# Patient Record
Sex: Female | Born: 1960
Health system: Southern US, Community
[De-identification: ages and names within clinical notes are randomized; demographics above are authoritative.]

## PROBLEM LIST (undated history)

## (undated) ENCOUNTER — Emergency Department (HOSPITAL_COMMUNITY): Admission: EM | Payer: Medicaid Other

## (undated) DIAGNOSIS — I252 Old myocardial infarction: Secondary | ICD-10-CM

## (undated) DIAGNOSIS — R519 Headache, unspecified: Secondary | ICD-10-CM

## (undated) DIAGNOSIS — C801 Malignant (primary) neoplasm, unspecified: Secondary | ICD-10-CM

## (undated) DIAGNOSIS — T8859XA Other complications of anesthesia, initial encounter: Secondary | ICD-10-CM

## (undated) DIAGNOSIS — R011 Cardiac murmur, unspecified: Secondary | ICD-10-CM

## (undated) DIAGNOSIS — Z9981 Dependence on supplemental oxygen: Secondary | ICD-10-CM

## (undated) DIAGNOSIS — I739 Peripheral vascular disease, unspecified: Secondary | ICD-10-CM

## (undated) DIAGNOSIS — J189 Pneumonia, unspecified organism: Secondary | ICD-10-CM

## (undated) DIAGNOSIS — I209 Angina pectoris, unspecified: Secondary | ICD-10-CM

## (undated) DIAGNOSIS — L600 Ingrowing nail: Secondary | ICD-10-CM

## (undated) DIAGNOSIS — I4891 Unspecified atrial fibrillation: Secondary | ICD-10-CM

## (undated) DIAGNOSIS — I499 Cardiac arrhythmia, unspecified: Secondary | ICD-10-CM

## (undated) DIAGNOSIS — E119 Type 2 diabetes mellitus without complications: Secondary | ICD-10-CM

## (undated) DIAGNOSIS — R0600 Dyspnea, unspecified: Secondary | ICD-10-CM

## (undated) DIAGNOSIS — D649 Anemia, unspecified: Secondary | ICD-10-CM

## (undated) DIAGNOSIS — K5900 Constipation, unspecified: Secondary | ICD-10-CM

## (undated) DIAGNOSIS — K219 Gastro-esophageal reflux disease without esophagitis: Secondary | ICD-10-CM

## (undated) DIAGNOSIS — R5383 Other fatigue: Secondary | ICD-10-CM

## (undated) DIAGNOSIS — J449 Chronic obstructive pulmonary disease, unspecified: Secondary | ICD-10-CM

## (undated) DIAGNOSIS — I10 Essential (primary) hypertension: Secondary | ICD-10-CM

## (undated) HISTORY — PX: MULTIPLE TOOTH EXTRACTIONS: SHX2053

## (undated) HISTORY — DX: Ingrowing nail: L60.0

## (undated) HISTORY — PX: OTHER SURGICAL HISTORY: SHX169

## (undated) HISTORY — PX: TONSILLECTOMY: SUR1361

---
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
## 1980-04-06 DIAGNOSIS — I209 Angina pectoris, unspecified: Secondary | ICD-10-CM

## 1980-04-06 HISTORY — DX: Angina pectoris, unspecified: I20.9

## 1993-04-06 DIAGNOSIS — L600 Ingrowing nail: Secondary | ICD-10-CM | POA: Insufficient documentation

## 1998-04-06 HISTORY — PX: NECK SURGERY: SHX720

## 1998-04-06 HISTORY — PX: CERVICAL DISC SURGERY: SHX588

## 1998-10-30 ENCOUNTER — Other Ambulatory Visit: Admission: RE | Admit: 1998-10-30 | Discharge: 1998-10-30 | Payer: Self-pay | Admitting: Obstetrics and Gynecology

## 1998-10-30 ENCOUNTER — Encounter (INDEPENDENT_AMBULATORY_CARE_PROVIDER_SITE_OTHER): Payer: Self-pay

## 1998-11-07 ENCOUNTER — Encounter: Payer: Self-pay | Admitting: Neurosurgery

## 1998-11-08 ENCOUNTER — Encounter: Payer: Self-pay | Admitting: Neurosurgery

## 1998-11-08 ENCOUNTER — Ambulatory Visit (HOSPITAL_COMMUNITY): Admission: RE | Admit: 1998-11-08 | Discharge: 1998-11-09 | Payer: Self-pay | Admitting: Neurosurgery

## 1998-12-24 ENCOUNTER — Encounter: Payer: Self-pay | Admitting: Neurosurgery

## 1998-12-24 ENCOUNTER — Ambulatory Visit (HOSPITAL_COMMUNITY): Admission: RE | Admit: 1998-12-24 | Discharge: 1998-12-24 | Payer: Self-pay | Admitting: Neurosurgery

## 2001-02-21 ENCOUNTER — Ambulatory Visit (HOSPITAL_COMMUNITY): Admission: RE | Admit: 2001-02-21 | Discharge: 2001-02-21 | Payer: Self-pay | Admitting: Obstetrics and Gynecology

## 2001-02-21 ENCOUNTER — Encounter: Payer: Self-pay | Admitting: Obstetrics and Gynecology

## 2001-03-22 ENCOUNTER — Other Ambulatory Visit: Admission: RE | Admit: 2001-03-22 | Discharge: 2001-03-22 | Payer: Self-pay | Admitting: Obstetrics and Gynecology

## 2002-02-23 DIAGNOSIS — M654 Radial styloid tenosynovitis [de Quervain]: Secondary | ICD-10-CM | POA: Insufficient documentation

## 2007-10-31 ENCOUNTER — Ambulatory Visit: Payer: Self-pay | Admitting: Nurse Practitioner

## 2007-10-31 ENCOUNTER — Encounter: Payer: Self-pay | Admitting: Gastroenterology

## 2007-10-31 DIAGNOSIS — R143 Flatulence: Secondary | ICD-10-CM

## 2007-10-31 DIAGNOSIS — M5030 Other cervical disc degeneration, unspecified cervical region: Secondary | ICD-10-CM | POA: Insufficient documentation

## 2007-10-31 DIAGNOSIS — A6000 Herpesviral infection of urogenital system, unspecified: Secondary | ICD-10-CM | POA: Insufficient documentation

## 2007-10-31 DIAGNOSIS — R141 Gas pain: Secondary | ICD-10-CM | POA: Insufficient documentation

## 2007-10-31 DIAGNOSIS — R142 Eructation: Secondary | ICD-10-CM

## 2007-10-31 DIAGNOSIS — K5900 Constipation, unspecified: Secondary | ICD-10-CM | POA: Insufficient documentation

## 2007-10-31 LAB — CONVERTED CEMR LAB
Bilirubin Urine: NEGATIVE
Blood in Urine, dipstick: NEGATIVE
Glucose, Urine, Semiquant: NEGATIVE
Ketones, urine, test strip: NEGATIVE
Nitrite: NEGATIVE
Protein, U semiquant: NEGATIVE
Specific Gravity, Urine: 1.01
Urobilinogen, UA: 0.2
WBC Urine, dipstick: NEGATIVE
pH: 6

## 2007-11-02 ENCOUNTER — Encounter (INDEPENDENT_AMBULATORY_CARE_PROVIDER_SITE_OTHER): Payer: Self-pay | Admitting: Nurse Practitioner

## 2008-09-04 ENCOUNTER — Encounter (INDEPENDENT_AMBULATORY_CARE_PROVIDER_SITE_OTHER): Payer: Self-pay | Admitting: Nurse Practitioner

## 2010-05-06 NOTE — Letter (Signed)
Summary: Handout Printed  Printed Handout:  - Constipation

## 2010-05-06 NOTE — Letter (Signed)
Summary: PATIENT INFORMATION & HISTORY SHEET  PATIENT INFORMATION & HISTORY SHEET   Imported ByArta Bruce 10/31/2007 15:38:15  _____________________________________________________________________  External Attachment:    Type:   Image     Comment:   External Document

## 2010-05-06 NOTE — Miscellaneous (Signed)
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.04) Observations: Added new observation of OTHER X-RAY: right wrist - normal study (11/05/2001 14:04) Added new observation of CXR RESULTS: no active disease (07/27/2001 14:04) Added new observation of OTHER X-RAY: cervical - disc space narrowing C4-5 with no other significant abnormality (09/13/1998 14:05) Added new observation of TD BOOSTER: historical (10/06/1994 14:03)      X-ray  Procedure date:  11/05/2001  Findings:      right wrist - normal study  CXR  Procedure date:  07/27/2001  Findings:      no active disease  X-ray  Procedure date:  09/13/1998  Findings:      cervical - disc space narrowing C4-5 with no other significant abnormality   X-ray  Procedure date:  11/05/2001  Findings:      right wrist - normal study  CXR  Procedure date:  07/27/2001  Findings:      no active disease  X-ray  Procedure date:  09/13/1998  Findings:      cervical - disc space narrowing C4-5 with no other significant abnormality

## 2010-05-06 NOTE — Letter (Signed)
Summary: BEHAVIORAL GUIDELINE  BEHAVIORAL GUIDELINE   Imported By: Arta Bruce 10/31/2007 15:40:22  _____________________________________________________________________  External Attachment:    Type:   Image     Comment:   External Document

## 2010-05-06 NOTE — Letter (Signed)
Summary: Handout Printed  Printed Handout:  - Flatulence Landscape architect)

## 2010-05-06 NOTE — Letter (Signed)
Summary: Handout Printed  Printed Handout:  - Fiber in Your Diet 

## 2010-05-06 NOTE — Assessment & Plan Note (Signed)
Summary: NEW -Constipation   Vital Signs:  Patient Profile:   50 Years Old Female Height:     65 inches Weight:      180 pounds BMI:     30.06 BSA:     1.89 Temp:     97.8 degrees F oral Pulse rate:   80 / minute Pulse rhythm:   regular Resp:     20 per minute BP sitting:   100 / 62  (left arm) Cuff size:   regular  Pt. in pain?   no  Vitals Entered By: Levon Hedger (October 31, 2007 11:31 AM)              Is Patient Diabetic? No  Does patient need assistance? Ambulation Normal Comments pt states that she has herpes and doesnt know if this is a flare-up or is it some other type of infection.     Chief Complaint:  new establish/vaginal discharge and discomfort.Marland Kitchen  History of Present Illness:  Pt into the office to establish care. Pt was previously a pt of [facility].  Last visit back in 2007. Last CPE 2004.  no recent PAP or mammogram.  Constipation -  notes that problems started back in 2007.  She eats some fruit and veges.  She has a little BM daily. She does take a fiber cleanser.  She has never had a colonscopy.  No blood noted in stool.  Some gas.  She does take charcoal pills for gas. stomach with some bloating from time to time. pt has started to increase her water.  She is now drinking 6-8 oz per day. She has been taking fiber supplements though she does not want to take daily. She knows that she needs to eat more leafy green veges, lettuce. snack on fruit.  no apples, raisins and grapes. No family hx of colon cancer  Herpes outbreak - about every 3 months.  sometimes she just gets itching and irritation.   she does see a coordination between her outbreaks and her lack of bowel movements.        Updated Prior Medication List: No Medications Current Allergies (reviewed today): ! PENICILLIN   Family History:    Family History Hypertension - father  Social History:    G3-1-1-1P0    tobacco use - stopped in 2007    Alcohol use-no    Drug  use-no    separated   Risk Factors:  Tobacco use:  quit    Year quit:  2007 Drug use:  no Caffeine use:  1 drinks per day Alcohol use:  no Seatbelt use:  100 % Sun Exposure:  occasionally  Family History Risk Factors:    Family History of MI in females < 37 years old:  no    Family History of MI in males < 65 years old:  no   Review of Systems  CV      Denies fatigue.  Resp      Denies cough.  GI      Complains of abdominal pain and constipation.  GU      Denies dysuria.   Physical Exam  General:     alert.   Head:     normocephalic.   Eyes:     glasses Lungs:     normal breath sounds.   Heart:     normal rate and regular rhythm.   Abdomen:      soft, non-tender, normal bowel sounds, and no distention.   Msk:  up to exam table Neurologic:     alert & oriented X3.   Psych:     Oriented X3.      Impression & Recommendations:  Problem # 1:  CONSTIPATION (ICD-564.00) handout given advised to eat more fiberous foods exercise water stool cards x 3 sent home with pt Orders: UA Dipstick w/o Micro (manual) (16109)   Problem # 2:  FLATULENCE (ICD-787.3) handout given  may take beano   Patient Instructions: 1)  Sign a release to get information from [facility]. 2)  You will need a complete physical exam.  You will need complete labs so do not eat after midnght on the day prior to exam.  You will get PAP and mammogram  3)  Add more fiber to diet.   4)  -drink water 5)  -exercise (water) 6)  -Return stool cards when collected   ] Laboratory Results   Urine Tests  Date/Time Received: October 31, 2007 4:48 PM Date/Time Reported:  October 31, 2007 4:48 PM   Routine Urinalysis   Color: lt. yellow Appearance: Clear Glucose: negative   (Normal Range: Negative) Bilirubin: negative   (Normal Range: Negative) Ketone: negative   (Normal Range: Negative) Spec. Gravity: 1.010   (Normal Range: 1.003-1.035) Blood: negative   (Normal Range:  Negative) pH: 6.0   (Normal Range: 5.0-8.0) Protein: negative   (Normal Range: Negative) Urobilinogen: 0.2   (Normal Range: 0-1) Nitrite: negative   (Normal Range: Negative) Leukocyte Esterace: negative   (Normal Range: Negative)

## 2010-08-28 ENCOUNTER — Other Ambulatory Visit: Payer: Self-pay | Admitting: Obstetrics and Gynecology

## 2010-08-28 DIAGNOSIS — Z1231 Encounter for screening mammogram for malignant neoplasm of breast: Secondary | ICD-10-CM

## 2010-09-03 ENCOUNTER — Ambulatory Visit: Payer: Self-pay

## 2010-09-05 ENCOUNTER — Ambulatory Visit
Admission: RE | Admit: 2010-09-05 | Discharge: 2010-09-05 | Disposition: A | Discharge status: Home / Self Care | Payer: 59 | Source: Ambulatory Visit | Attending: Obstetrics and Gynecology | Admitting: Obstetrics and Gynecology

## 2010-09-05 DIAGNOSIS — Z1231 Encounter for screening mammogram for malignant neoplasm of breast: Secondary | ICD-10-CM

## 2011-01-13 ENCOUNTER — Inpatient Hospital Stay (INDEPENDENT_AMBULATORY_CARE_PROVIDER_SITE_OTHER)
Admission: RE | Admit: 2011-01-13 | Discharge: 2011-01-13 | Disposition: A | Discharge status: Home / Self Care | Payer: 59 | Source: Ambulatory Visit | Attending: Family Medicine | Admitting: Family Medicine

## 2011-01-13 DIAGNOSIS — J310 Chronic rhinitis: Secondary | ICD-10-CM

## 2011-01-13 DIAGNOSIS — J019 Acute sinusitis, unspecified: Secondary | ICD-10-CM

## 2011-06-09 ENCOUNTER — Encounter (INDEPENDENT_AMBULATORY_CARE_PROVIDER_SITE_OTHER): Payer: 59 | Admitting: Obstetrics and Gynecology

## 2011-06-09 DIAGNOSIS — E569 Vitamin deficiency, unspecified: Secondary | ICD-10-CM

## 2011-06-09 DIAGNOSIS — N951 Menopausal and female climacteric states: Secondary | ICD-10-CM

## 2011-06-09 DIAGNOSIS — N926 Irregular menstruation, unspecified: Secondary | ICD-10-CM

## 2011-06-15 ENCOUNTER — Encounter: Payer: 59 | Admitting: Obstetrics and Gynecology

## 2011-06-16 ENCOUNTER — Other Ambulatory Visit: Payer: 59

## 2011-06-19 ENCOUNTER — Other Ambulatory Visit: Payer: 59

## 2011-06-19 ENCOUNTER — Other Ambulatory Visit (INDEPENDENT_AMBULATORY_CARE_PROVIDER_SITE_OTHER): Payer: 59

## 2011-06-19 ENCOUNTER — Encounter: Payer: 59 | Admitting: Obstetrics and Gynecology

## 2011-06-19 ENCOUNTER — Encounter (INDEPENDENT_AMBULATORY_CARE_PROVIDER_SITE_OTHER): Payer: 59 | Admitting: Obstetrics and Gynecology

## 2011-06-19 DIAGNOSIS — N950 Postmenopausal bleeding: Secondary | ICD-10-CM

## 2011-06-19 DIAGNOSIS — N8500 Endometrial hyperplasia, unspecified: Secondary | ICD-10-CM

## 2011-07-22 ENCOUNTER — Telehealth: Payer: Self-pay | Admitting: Obstetrics and Gynecology

## 2011-08-03 ENCOUNTER — Other Ambulatory Visit: Payer: Self-pay | Admitting: Obstetrics and Gynecology

## 2011-08-03 NOTE — Progress Notes (Signed)
Addended by: Jaymes Graff on: 08/03/2011 06:58 PM   Modules accepted: Orders

## 2011-08-18 ENCOUNTER — Encounter: Payer: 59 | Admitting: Obstetrics and Gynecology

## 2011-08-24 ENCOUNTER — Telehealth: Payer: Self-pay | Admitting: Obstetrics and Gynecology

## 2011-08-24 NOTE — Telephone Encounter (Signed)
D&C HYSTEROSCOPY; POLYPECTOMY SCHEDULED FOR 09/10/11 @ 11:15 WITH ND. [employer] EFFECTIVE 09/04/08.  PLAN PAYS 80/20 AFTER A $1,000 DEDUCTIBLE. PRE-OP DUE $81.49 Adrianne Pridgen

## 2011-08-27 ENCOUNTER — Inpatient Hospital Stay (HOSPITAL_COMMUNITY): Admission: RE | Admit: 2011-08-27 | Payer: 59 | Source: Ambulatory Visit

## 2011-09-07 ENCOUNTER — Encounter: Payer: Self-pay | Admitting: Obstetrics and Gynecology

## 2011-09-07 ENCOUNTER — Ambulatory Visit (INDEPENDENT_AMBULATORY_CARE_PROVIDER_SITE_OTHER): Payer: 59 | Admitting: Obstetrics and Gynecology

## 2011-09-07 VITALS — BP 120/60 | HR 54 | Temp 98.0°F | Ht 66.5 in | Wt 176.0 lb

## 2011-09-07 DIAGNOSIS — N924 Excessive bleeding in the premenopausal period: Secondary | ICD-10-CM
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
## 2011-09-08 ENCOUNTER — Encounter (HOSPITAL_COMMUNITY): Payer: Self-pay

## 2011-09-08 ENCOUNTER — Encounter (HOSPITAL_COMMUNITY)
Admission: RE | Admit: 2011-09-08 | Discharge: 2011-09-08 | Disposition: A | Discharge status: Home / Self Care | Payer: 59 | Source: Ambulatory Visit | Attending: Obstetrics and Gynecology | Admitting: Obstetrics and Gynecology

## 2011-09-08 DIAGNOSIS — Z01818 Encounter for other preprocedural examination: Secondary | ICD-10-CM | POA: Insufficient documentation

## 2011-09-08 DIAGNOSIS — Z01812 Encounter for preprocedural laboratory examination: Secondary | ICD-10-CM | POA: Insufficient documentation

## 2011-09-08 HISTORY — DX: Angina pectoris, unspecified: I20.9

## 2011-09-08 HISTORY — DX: Old myocardial infarction: I25.2

## 2011-09-08 HISTORY — DX: Cardiac murmur, unspecified: R01.1

## 2011-09-08 LAB — CBC
HCT: 40.1 % (ref 36.0–46.0)
Hemoglobin: 13.6 g/dL (ref 12.0–15.0)
MCH: 30.6 pg (ref 26.0–34.0)
MCHC: 33.9 g/dL (ref 30.0–36.0)
MCV: 90.1 fL (ref 78.0–100.0)
Platelets: 214 10*3/uL (ref 150–400)
RBC: 4.45 MIL/uL (ref 3.87–5.11)
RDW: 12.9 % (ref 11.5–15.5)
WBC: 6.9 10*3/uL (ref 4.0–10.5)

## 2011-09-08 NOTE — Patient Instructions (Addendum)
YOUR PROCEDURE IS SCHEDULED ON:09/10/11  ENTER THROUGH THE MAIN ENTRANCE OF [facility] [phone] am Thursday  USE DESK PHONE AND DIAL [phone] TO INFORM us OF YOUR ARRIVAL  CALL [phone] IF YOU HAVE ANY QUESTIONS OR PROBLEMS PRIOR TO YOUR ARRIVAL.  REMEMBER: DO NOT EAT OR DRINK AFTER MIDNIGHT : Wed    YOU MAY BRUSH YOUR TEETH THE MORNING OF SURGERY   TAKE THESE MEDICINES THE DAY OF SURGERY WITH SIP OF WATER:   DO NOT WEAR JEWELRY, EYE MAKEUP, LIPSTICK OR DARK FINGERNAIL POLISH DO NOT WEAR LOTIONS DO NOT SHAVE FOR 48 HOURS PRIOR TO SURGERY  YOU WILL NOT BE ALLOWED TO DRIVE YOURSELF HOME.  NAME OF DRIVER:Jerry Shoffner

## 2011-09-08 NOTE — Pre-Procedure Instructions (Signed)
Patient seemed unsure of details re "heart attack" in 1980s. She was seen at [facility], which is no longer in existence. I ran an EKG and gave results ( sinus bradycardia) to Dr. Jean Rosenthal, which he is okay with. Pt states she has always had a slow pulse.

## 2011-09-09 ENCOUNTER — Encounter: Payer: 59 | Admitting: Obstetrics and Gynecology

## 2011-09-30 ENCOUNTER — Other Ambulatory Visit: Payer: Self-pay | Admitting: Obstetrics and Gynecology

## 2011-10-07 ENCOUNTER — Encounter: Payer: 59 | Admitting: Obstetrics and Gynecology

## 2011-11-26 ENCOUNTER — Encounter (HOSPITAL_COMMUNITY): Admission: RE | Payer: Self-pay | Source: Ambulatory Visit

## 2011-11-26 ENCOUNTER — Ambulatory Visit (HOSPITAL_COMMUNITY): Admission: RE | Admit: 2011-11-26 | Payer: 59 | Source: Ambulatory Visit | Admitting: Obstetrics and Gynecology

## 2011-11-26 SURGERY — DILATATION AND CURETTAGE /HYSTEROSCOPY
Anesthesia: Choice

## 2011-12-07 ENCOUNTER — Encounter: Payer: 59 | Admitting: Obstetrics and Gynecology

## 2011-12-09 ENCOUNTER — Encounter: Payer: 59 | Admitting: Obstetrics and Gynecology

## 2011-12-24 ENCOUNTER — Telehealth: Payer: Self-pay | Admitting: Obstetrics and Gynecology

## 2011-12-24 NOTE — Telephone Encounter (Signed)
Call to pharmacy to advised that patient is to have 2 bottles of her Elestrin 6% gel as she is to use 2 pumps daily.  Leba Tibbitts, PA-C

## 2011-12-24 NOTE — Telephone Encounter (Signed)
VM from [facility]. Pt has RX for Elestrin gel. Directions state 1 pump daily but pt states is to use 2 pumps/day. If so, needs 2 bottles . Pharmacy # [phone].

## 2012-08-02 ENCOUNTER — Other Ambulatory Visit: Payer: Self-pay

## 2012-08-02 DIAGNOSIS — Z1231 Encounter for screening mammogram for malignant neoplasm of breast: Secondary | ICD-10-CM

## 2012-08-03 ENCOUNTER — Ambulatory Visit
Admission: RE | Admit: 2012-08-03 | Discharge: 2012-08-03 | Disposition: A | Discharge status: Home / Self Care | Payer: 59 | Source: Ambulatory Visit

## 2012-08-03 DIAGNOSIS — Z1231 Encounter for screening mammogram for malignant neoplasm of breast: Secondary | ICD-10-CM

## 2017-10-06 ENCOUNTER — Encounter (HOSPITAL_COMMUNITY): Payer: Self-pay

## 2017-10-06 ENCOUNTER — Emergency Department (HOSPITAL_COMMUNITY)
Admission: EM | Admit: 2017-10-06 | Discharge: 2017-10-06 | Disposition: A | Discharge status: Home / Self Care | Payer: No Typology Code available for payment source | Attending: Emergency Medicine | Admitting: Emergency Medicine

## 2017-10-06 ENCOUNTER — Other Ambulatory Visit: Payer: Self-pay

## 2017-10-06 ENCOUNTER — Emergency Department (HOSPITAL_COMMUNITY): Payer: No Typology Code available for payment source

## 2017-10-06 DIAGNOSIS — S0081XA Abrasion of other part of head, initial encounter: Secondary | ICD-10-CM | POA: Insufficient documentation

## 2017-10-06 DIAGNOSIS — S20212A Contusion of left front wall of thorax, initial encounter: Secondary | ICD-10-CM

## 2017-10-06 DIAGNOSIS — S40021A Contusion of right upper arm, initial encounter: Secondary | ICD-10-CM | POA: Insufficient documentation

## 2017-10-06 DIAGNOSIS — S161XXA Strain of muscle, fascia and tendon at neck level, initial encounter: Secondary | ICD-10-CM | POA: Insufficient documentation

## 2017-10-06 DIAGNOSIS — Y9389 Activity, other specified: Secondary | ICD-10-CM | POA: Insufficient documentation

## 2017-10-06 DIAGNOSIS — R519 Headache, unspecified: Secondary | ICD-10-CM

## 2017-10-06 DIAGNOSIS — Y999 Unspecified external cause status: Secondary | ICD-10-CM | POA: Insufficient documentation

## 2017-10-06 DIAGNOSIS — Z79899 Other long term (current) drug therapy: Secondary | ICD-10-CM | POA: Diagnosis not present

## 2017-10-06 DIAGNOSIS — S199XXA Unspecified injury of neck, initial encounter: Secondary | ICD-10-CM | POA: Diagnosis present

## 2017-10-06 DIAGNOSIS — R51 Headache: Secondary | ICD-10-CM | POA: Insufficient documentation

## 2017-10-06 DIAGNOSIS — Y92410 Unspecified street and highway as the place of occurrence of the external cause: Secondary | ICD-10-CM | POA: Insufficient documentation

## 2017-10-06 DIAGNOSIS — S301XXA Contusion of abdominal wall, initial encounter: Secondary | ICD-10-CM | POA: Diagnosis not present

## 2017-10-06 LAB — CBC WITH DIFFERENTIAL/PLATELET
Basophils Absolute: 0 10*3/uL (ref 0.0–0.1)
Basophils Relative: 1 %
Eosinophils Absolute: 0.3 10*3/uL (ref 0.0–0.7)
Eosinophils Relative: 5 %
HCT: 39.9 % (ref 36.0–46.0)
Hemoglobin: 13.1 g/dL (ref 12.0–15.0)
Lymphocytes Relative: 39 %
Lymphs Abs: 2.4 10*3/uL (ref 0.7–4.0)
MCH: 28.9 pg (ref 26.0–34.0)
MCHC: 32.8 g/dL (ref 30.0–36.0)
MCV: 88.1 fL (ref 78.0–100.0)
Monocytes Absolute: 0.3 10*3/uL (ref 0.1–1.0)
Monocytes Relative: 5 %
Neutro Abs: 3.1 10*3/uL (ref 1.7–7.7)
Neutrophils Relative %: 50 %
Platelets: 233 10*3/uL (ref 150–400)
RBC: 4.53 MIL/uL (ref 3.87–5.11)
RDW: 13.6 % (ref 11.5–15.5)
WBC: 6.1 10*3/uL (ref 4.0–10.5)

## 2017-10-06 LAB — BASIC METABOLIC PANEL
Anion gap: 11 (ref 5–15)
BUN: 8 mg/dL (ref 6–20)
CO2: 23 mmol/L (ref 22–32)
Calcium: 9 mg/dL (ref 8.9–10.3)
Chloride: 109 mmol/L (ref 98–111)
Creatinine, Ser: 0.78 mg/dL (ref 0.44–1.00)
GFR calc Af Amer: >60 mL/min (ref >60)
GFR calc non Af Amer: >60 mL/min (ref >60)
Glucose, Bld: 164 mg/dL — ABNORMAL HIGH (ref 70–99)
Potassium: 3.7 mmol/L (ref 3.5–5.1)
Sodium: 143 mmol/L (ref 135–145)

## 2017-10-06 MED ORDER — IOPAMIDOL (ISOVUE-300) INJECTION 61%
100.0000 mL | Freq: Once | INTRAVENOUS | Status: AC | PRN
  Administered 2017-10-06: 100 mL via INTRAVENOUS

## 2017-10-06 MED ORDER — IOPAMIDOL (ISOVUE-300) INJECTION 61%
INTRAVENOUS | Status: AC
  Filled 2017-10-06: qty 100

## 2017-10-06 MED ORDER — ORPHENADRINE CITRATE ER 100 MG PO TB12: 100.0000 mg | ORAL_TABLET | Freq: Two times a day (BID) | ORAL | 0 refills | Status: AC

## 2017-10-06 MED ORDER — MELOXICAM 7.5 MG PO TABS: 7.5000 mg | ORAL_TABLET | Freq: Every day | ORAL | 0 refills | Status: DC

## 2017-10-06 MED ORDER — KETOROLAC TROMETHAMINE 15 MG/ML IJ SOLN
15.0000 mg | Freq: Once | INTRAMUSCULAR | Status: AC
  Administered 2017-10-06: 15 mg via INTRAVENOUS
  Filled 2017-10-06: qty 1

## 2017-10-06 NOTE — ED Notes (Signed)
Patient verbalized understanding of discharge instructions, no questions. Patient ambulated out of ED with steady gait in no distress.  
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
## 2017-10-06 NOTE — Discharge Instructions (Addendum)
Follow up with your PCP for recheck in 2 days. Return to the ER for worsening or concerning symptoms.  Warm compresses to sore muscles.  Meloxicam and Norflex as needed as prescribed for pain, headaches, spasms. Do not drive or operate machinery while taking Norflex.
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
## 2018-04-11 ENCOUNTER — Telehealth: Payer: Self-pay
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
## 2018-04-15 ENCOUNTER — Encounter: Payer: Self-pay | Admitting: Nurse Practitioner

## 2018-04-15 ENCOUNTER — Ambulatory Visit: Payer: Self-pay | Admitting: Nurse Practitioner

## 2018-04-15 VITALS — BP 110/70 | HR 65 | Temp 97.7°F | Ht 65.0 in | Wt 198.0 lb

## 2018-04-15 DIAGNOSIS — M5410 Radiculopathy, site unspecified: Secondary | ICD-10-CM | POA: Insufficient documentation

## 2018-04-15 DIAGNOSIS — M542 Cervicalgia: Secondary | ICD-10-CM | POA: Insufficient documentation

## 2018-04-15 MED ORDER — ORPHENADRINE CITRATE ER 100 MG PO TB12: 100.0000 mg | ORAL_TABLET | Freq: Two times a day (BID) | ORAL | 1 refills | Status: DC

## 2018-04-15 MED ORDER — NOREL AD 4-10-325 MG PO TABS: 1.0000 | ORAL_TABLET | Freq: Every day | ORAL | 1 refills | Status: DC

## 2018-04-15 MED ORDER — MELOXICAM 7.5 MG PO TABS: 7.5000 mg | ORAL_TABLET | ORAL | 1 refills | Status: DC | PRN
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
## 2018-04-24 ENCOUNTER — Ambulatory Visit
Admission: RE | Admit: 2018-04-24 | Discharge: 2018-04-24 | Disposition: A | Discharge status: Home / Self Care | Payer: Self-pay | Source: Ambulatory Visit | Attending: Nurse Practitioner | Admitting: Nurse Practitioner

## 2018-04-24 DIAGNOSIS — M5410 Radiculopathy, site unspecified: Secondary | ICD-10-CM

## 2018-04-24 DIAGNOSIS — M542 Cervicalgia: Secondary | ICD-10-CM

## 2018-04-25 ENCOUNTER — Other Ambulatory Visit: Payer: Self-pay | Admitting: Nurse Practitioner

## 2018-04-25 ENCOUNTER — Telehealth: Payer: Self-pay

## 2018-04-25 DIAGNOSIS — F418 Other specified anxiety disorders: Secondary | ICD-10-CM

## 2018-04-25 MED ORDER — DIAZEPAM 2 MG PO TABS: ORAL_TABLET | ORAL | 0 refills | Status: DC

## 2018-04-25 NOTE — Telephone Encounter (Signed)
Sent in 4 tabs 1 tab 30 mins prior to MRI may repeat x 1. Recommended to have someone to drive her home due to risk of causing sleepiness.

## 2018-04-25 NOTE — Telephone Encounter (Signed)
Pt stated that she has gotten her MRI scheduled, however she is going to need some Valium medication for that session since she is claustrophobic.   Please advise on med request.  Thanks, Guadelupe Sabin

## 2018-04-28 NOTE — Telephone Encounter (Signed)
I have called the pt and informed her of the message below. She stated understanding and will have a driver.  Thanks, Zettie Pho

## 2018-05-01 ENCOUNTER — Encounter: Payer: Self-pay | Admitting: Nurse Practitioner

## 2018-05-08 ENCOUNTER — Ambulatory Visit
Admission: RE | Admit: 2018-05-08 | Discharge: 2018-05-08 | Disposition: A | Discharge status: Home / Self Care | Payer: Self-pay | Source: Ambulatory Visit | Attending: Nurse Practitioner | Admitting: Nurse Practitioner

## 2018-05-08 MED ORDER — GADOBENATE DIMEGLUMINE 529 MG/ML IV SOLN
17.0000 mL | Freq: Once | INTRAVENOUS | Status: AC | PRN
  Administered 2018-05-08: 17 mL via INTRAVENOUS

## 2018-05-09 ENCOUNTER — Other Ambulatory Visit: Payer: Self-pay | Admitting: Nurse Practitioner

## 2018-05-09 ENCOUNTER — Telehealth: Payer: Self-pay | Admitting: Nurse Practitioner

## 2018-05-09 DIAGNOSIS — M5410 Radiculopathy, site unspecified: Secondary | ICD-10-CM

## 2018-05-09 DIAGNOSIS — M4802 Spinal stenosis, cervical region: Secondary | ICD-10-CM

## 2018-05-09 NOTE — Telephone Encounter (Signed)
Patient returned call and was given results of her MRI, she would like to see Dr. Deri Fuelling since she has seen him previously.

## 2018-05-09 NOTE — Telephone Encounter (Signed)
Called patient to discuss results of MRI of neck, has moderate to severe foraminal stenosis (narrowing) of the spinal column.  I would like to refer her to a neurosurgeon for further evaluation would like to confirm she does not have one already.

## 2018-12-20 ENCOUNTER — Encounter: Payer: Self-pay | Admitting: Nurse Practitioner

## 2018-12-20 ENCOUNTER — Other Ambulatory Visit: Payer: Self-pay

## 2018-12-20 ENCOUNTER — Telehealth (INDEPENDENT_AMBULATORY_CARE_PROVIDER_SITE_OTHER): Payer: Self-pay | Admitting: Nurse Practitioner

## 2018-12-20 VITALS — Temp 99.5°F | Wt 198.0 lb

## 2018-12-20 DIAGNOSIS — R509 Fever, unspecified: Secondary | ICD-10-CM | POA: Insufficient documentation

## 2018-12-20 DIAGNOSIS — Z1159 Encounter for screening for other viral diseases: Secondary | ICD-10-CM

## 2018-12-20 DIAGNOSIS — R6883 Chills (without fever): Secondary | ICD-10-CM | POA: Insufficient documentation

## 2018-12-20 MED ORDER — AZITHROMYCIN 250 MG PO TABS: ORAL_TABLET | ORAL | 0 refills | Status: AC

## 2018-12-21 ENCOUNTER — Other Ambulatory Visit: Payer: Self-pay

## 2018-12-21 DIAGNOSIS — Z20822 Contact with and (suspected) exposure to covid-19: Secondary | ICD-10-CM

## 2018-12-22 LAB — NOVEL CORONAVIRUS, NAA: SARS-CoV-2, NAA: NOT DETECTED

## 2018-12-26 ENCOUNTER — Encounter: Payer: Self-pay | Admitting: Nurse Practitioner

## 2018-12-28 ENCOUNTER — Encounter: Payer: Self-pay | Admitting: Nurse Practitioner

## 2018-12-29 ENCOUNTER — Telehealth: Payer: Self-pay

## 2018-12-29 ENCOUNTER — Other Ambulatory Visit: Payer: Self-pay

## 2018-12-29 ENCOUNTER — Encounter: Payer: Self-pay | Admitting: Nurse Practitioner

## 2018-12-29 MED ORDER — PREDNISONE 10 MG (21) PO TBPK: ORAL_TABLET | ORAL | 0 refills | Status: DC

## 2018-12-29 NOTE — Telephone Encounter (Signed)
I called patient concerning her mychart message and notified her that we have sent her in a medication to the pharmacy and I have resent her work note, Lonia Mad

## 2019-01-01 NOTE — Progress Notes (Signed)
Virtual Visit via Telephone   This visit type was conducted due to national recommendations for restrictions regarding the COVID-19 Pandemic (e.g. social distancing) in an effort to limit this patient's exposure and mitigate transmission in our community.  Due to her co-morbid illnesses, this patient is at least at moderate risk for complications without adequate follow up.  This format is felt to be most appropriate for this patient at this time.  All issues noted in this document were discussed and addressed.  A limited physical exam was performed with this format.    This visit type was conducted due to national recommendations for restrictions regarding the COVID-19 Pandemic (e.g. social distancing) in an effort to limit this patient's exposure and mitigate transmission in our community.  Patients identity confirmed using two different identifiers.  This format is felt to be most appropriate for this patient at this time.  All issues noted in this document were discussed and addressed.  No physical exam was performed (except for noted visual exam findings with Video Visits).    Date:  01/01/2019   ID:  Elizabeth Mayer, DOB February 18, 1961, MRN [id number]  Patient Location:  Home - spoke with Elizabeth Mayer  Provider location:   Office    Chief Complaint:  Low grade fever and chills  History of Present Illness:    Elizabeth Mayer is a 58 y.o. female who presents via video conferencing for a telehealth visit today.    The patient does have symptoms concerning for COVID-19 infection (fever, chills, cough, or new shortness of breath).   Fever  This is a new problem. The current episode started in the past 7 days. The problem occurs constantly. The problem has been unchanged. The temperature was taken using an oral thermometer. Pertinent negatives include no abdominal pain, congestion, coughing, headaches or muscle aches. She has tried nothing for the symptoms.  Risk factors: no contaminated  food, no recent travel and no sick contacts      Past Medical History:  Diagnosis Date  . Angina 1982   related to stress  . Asthma    in the past, not now- never inhlaer use  . Heart murmur 1970s  . Ingrown toenail   . Past heart attack 1980-1981   pt states she passed out and woke up in hospital- told she had heart attack, but then dr said he couldn't find anything wrong.   Past Surgical History:  Procedure Laterality Date  . NECK SURGERY  2000   Disc removed from neck      Current Meds  Medication Sig  . Ascorbic Acid (VITAMIN C ADULT GUMMIES PO) Take 1 capsule by mouth daily.  Marland Kitchen BIOTIN PO Take 1 tablet by mouth daily.   . meloxicam (MOBIC) 7.5 MG tablet Take 1 tablet (7.5 mg total) by mouth as needed for pain.  Renda Rolls AD 4-10-325 MG TABS Take 1 tablet by mouth daily.  . orphenadrine (NORFLEX) 100 MG tablet Take 1 tablet (100 mg total) by mouth 2 (two) times daily.     Allergies:   Pseudoephedrine, Gabapentin, and Penicillins   Social History   Tobacco Use  . Smoking status: Never Smoker  . Smokeless tobacco: Never Used  Substance Use Topics  . Alcohol use: No  . Drug use: No     Family Hx: The patient's family history includes Heart failure in her father.  ROS:   Please see the history of present illness.    Review of Systems  Constitutional: Positive for fever.  HENT: Negative for congestion.   Eyes: Negative.   Respiratory: Negative for cough.   Cardiovascular: Negative.   Gastrointestinal: Negative for abdominal pain.  Neurological: Negative for dizziness and headaches.    All other systems reviewed and are negative.   Labs/Other Tests and Data Reviewed:    Recent Labs: No results found for requested labs within last 8760 hours.   Recent Lipid Panel No results found for: CHOL, TRIG, HDL, CHOLHDL, LDLCALC, LDLDIRECT  Wt Readings from Last 3 Encounters:  12/20/18 198 lb (89.8 kg)  04/15/18 198 lb (89.8 kg)  10/06/17 176 lb (79.8 kg)      Exam:    Vital Signs:  Temp 99.5 F (37.5 C) (Oral)   Wt 198 lb (89.8 kg)   BMI 32.95 kg/m     Physical Exam  Constitutional: She is well-developed, well-nourished, and in no distress.  Psychiatric: Mood, memory, affect and judgment normal.    ASSESSMENT & PLAN:     1. Fever, unspecified fever cause  I will have her tested for coronavirus due to the symptoms and the current pandemic  She is working outside of the house - azithromycin (ZITHROMAX) 250 MG tablet; Take 2 tablets (500 mg) on  Day 1,  followed by 1 tablet (250 mg) once daily on Days 2 through 5.  Dispense: 6 each; Refill: 0  2. Chills  I will also treat empirically pending lab results.  She is advised to remain in self-isolation until her labs returns - azithromycin (ZITHROMAX) 250 MG tablet; Take 2 tablets (500 mg) on  Day 1,  followed by 1 tablet (250 mg) once daily on Days 2 through 5.  Dispense: 6 each; Refill: 0    COVID-19 Education: The signs and symptoms of COVID-19 were discussed with the patient and how to seek care for testing (follow up with PCP or arrange E-visit).  The importance of social distancing was discussed today.  Patient Risk:   After full review of this patients clinical status, I feel that they are at least moderate risk at this time.  Time:   Today, I have spent 12 minutes/ seconds with the patient with telehealth technology discussing above diagnoses.     Medication Adjustments/Labs and Tests Ordered: Current medicines are reviewed at length with the patient today.  Concerns regarding medicines are outlined above.   Tests Ordered: No orders of the defined types were placed in this encounter.   Medication Changes: Meds ordered this encounter  Medications  . azithromycin (ZITHROMAX) 250 MG tablet    Sig: Take 2 tablets (500 mg) on  Day 1,  followed by 1 tablet (250 mg) once daily on Days 2 through 5.    Dispense:  6 each    Refill:  0    Disposition:  Follow up prn   Signed, Minette Brine, FNP

## 2019-01-04 ENCOUNTER — Encounter: Payer: Self-pay | Admitting: Nurse Practitioner

## 2019-01-05 ENCOUNTER — Other Ambulatory Visit: Payer: Self-pay | Admitting: Nurse Practitioner

## 2019-01-12 ENCOUNTER — Encounter: Payer: Self-pay | Admitting: Nurse Practitioner

## 2019-01-19 ENCOUNTER — Encounter: Payer: Self-pay | Admitting: Nurse Practitioner

## 2019-01-19 ENCOUNTER — Ambulatory Visit (INDEPENDENT_AMBULATORY_CARE_PROVIDER_SITE_OTHER): Payer: Managed Care, Other (non HMO) | Admitting: Nurse Practitioner

## 2019-01-19 ENCOUNTER — Other Ambulatory Visit: Payer: Self-pay

## 2019-01-19 VITALS — BP 110/60 | HR 72 | Temp 98.6°F | Ht 65.0 in | Wt 192.4 lb

## 2019-01-19 DIAGNOSIS — R509 Fever, unspecified: Secondary | ICD-10-CM

## 2019-01-19 DIAGNOSIS — N390 Urinary tract infection, site not specified: Secondary | ICD-10-CM

## 2019-01-19 DIAGNOSIS — R630 Anorexia: Secondary | ICD-10-CM

## 2019-01-19 DIAGNOSIS — R002 Palpitations: Secondary | ICD-10-CM | POA: Diagnosis not present

## 2019-01-19 DIAGNOSIS — R6883 Chills (without fever): Secondary | ICD-10-CM

## 2019-01-19 LAB — POCT URINALYSIS DIPSTICK
Bilirubin, UA: NEGATIVE
Blood, UA: NEGATIVE
Glucose, UA: NEGATIVE
Ketones, UA: NEGATIVE
Leukocytes, UA: NEGATIVE
Nitrite, UA: POSITIVE
Protein, UA: NEGATIVE
Spec Grav, UA: 1.015 (ref 1.010–1.025)
Urobilinogen, UA: 0.2 E.U./dL
pH, UA: 7 (ref 5.0–8.0)

## 2019-01-19 MED ORDER — NITROFURANTOIN MONOHYD MACRO 100 MG PO CAPS: 100.0000 mg | ORAL_CAPSULE | Freq: Two times a day (BID) | ORAL | 0 refills | Status: AC

## 2019-01-19 MED ORDER — FLUCONAZOLE 150 MG PO TABS: ORAL_TABLET | ORAL | 0 refills | Status: DC
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
## 2019-01-20 LAB — CMP14+EGFR
ALT: 6 IU/L (ref 0–32)
AST: 13 IU/L (ref 0–40)
Albumin/Globulin Ratio: 1.7 (ref 1.2–2.2)
Albumin: 4 g/dL (ref 3.8–4.9)
Alkaline Phosphatase: 39 IU/L (ref 39–117)
BUN/Creatinine Ratio: 6 — ABNORMAL LOW (ref 12–28)
BUN: 5 mg/dL — ABNORMAL LOW (ref 8–27)
Bilirubin Total: 0.3 mg/dL (ref 0.0–1.2)
CO2: 25 mmol/L (ref 20–29)
Calcium: 8.9 mg/dL (ref 8.7–10.3)
Chloride: 104 mmol/L (ref 96–106)
Creatinine, Ser: 0.83 mg/dL (ref 0.57–1.00)
GFR calc Af Amer: 89 mL/min/{1.73_m2} (ref >59)
GFR calc non Af Amer: 77 mL/min/{1.73_m2} (ref >59)
Globulin, Total: 2.4 g/dL (ref 1.5–4.5)
Glucose: 82 mg/dL (ref 65–99)
Potassium: 3.8 mmol/L (ref 3.5–5.2)
Sodium: 141 mmol/L (ref 134–144)
Total Protein: 6.4 g/dL (ref 6.0–8.5)

## 2019-01-20 LAB — LYME AB/WESTERN BLOT REFLEX
LYME DISEASE AB, QUANT, IGM: <0.8 index (ref 0.00–0.79)
Lyme IgG/IgM Ab: <0.91 {ISR} (ref 0.00–0.90)

## 2019-01-20 LAB — CBC
Hematocrit: 38.4 % (ref 34.0–46.6)
Hemoglobin: 12.7 g/dL (ref 11.1–15.9)
MCH: 28.2 pg (ref 26.6–33.0)
MCHC: 33.1 g/dL (ref 31.5–35.7)
MCV: 85 fL (ref 79–97)
Platelets: 180 10*3/uL (ref 150–450)
RBC: 4.5 x10E6/uL (ref 3.77–5.28)
RDW: 13.2 % (ref 11.7–15.4)
WBC: 5.3 10*3/uL (ref 3.4–10.8)

## 2019-01-20 LAB — HEMOGLOBIN A1C
Est. average glucose Bld gHb Est-mCnc: 128 mg/dL
Hgb A1c MFr Bld: 6.1 % — ABNORMAL HIGH (ref 4.8–5.6)

## 2019-01-20 LAB — CMP14 + ANION GAP: Anion Gap: 12 mmol/L (ref 10.0–18.0)

## 2019-01-20 LAB — MONO QUAL W/RFLX QN: Mono Qual W/Rflx Qn: NEGATIVE

## 2019-01-21 LAB — NOVEL CORONAVIRUS, NAA: SARS-CoV-2, NAA: NOT DETECTED

## 2019-01-21 LAB — URINE CULTURE

## 2019-03-20 ENCOUNTER — Telehealth: Payer: Self-pay

## 2019-03-20 NOTE — Telephone Encounter (Signed)
PT LVM TO SCHEDULE APPT.ATT TO CONTACT PT NO ANS LVM

## 2019-04-28 ENCOUNTER — Other Ambulatory Visit: Payer: Self-pay | Admitting: Nurse Practitioner

## 2019-04-28 DIAGNOSIS — M542 Cervicalgia: Secondary | ICD-10-CM

## 2019-05-08 ENCOUNTER — Other Ambulatory Visit: Payer: Self-pay

## 2019-05-08 DIAGNOSIS — M542 Cervicalgia: Secondary | ICD-10-CM

## 2019-05-08 MED ORDER — MELOXICAM 7.5 MG PO TABS: ORAL_TABLET | ORAL | 0 refills | Status: DC

## 2019-05-18 ENCOUNTER — Ambulatory Visit: Payer: Managed Care, Other (non HMO) | Attending: Internal Medicine

## 2019-05-18 DIAGNOSIS — Z20822 Contact with and (suspected) exposure to covid-19: Secondary | ICD-10-CM

## 2019-05-19 LAB — NOVEL CORONAVIRUS, NAA: SARS-CoV-2, NAA: NOT DETECTED

## 2019-06-02 ENCOUNTER — Ambulatory Visit: Payer: Managed Care, Other (non HMO) | Attending: Internal Medicine

## 2019-06-02 DIAGNOSIS — Z20822 Contact with and (suspected) exposure to covid-19: Secondary | ICD-10-CM

## 2019-06-03 LAB — NOVEL CORONAVIRUS, NAA: SARS-CoV-2, NAA: NOT DETECTED

## 2019-06-16 ENCOUNTER — Ambulatory Visit: Payer: Managed Care, Other (non HMO) | Attending: Internal Medicine

## 2019-06-16 DIAGNOSIS — Z20822 Contact with and (suspected) exposure to covid-19: Secondary | ICD-10-CM

## 2019-06-17 LAB — NOVEL CORONAVIRUS, NAA: SARS-CoV-2, NAA: NOT DETECTED

## 2019-07-31 ENCOUNTER — Encounter: Payer: Self-pay | Admitting: Nurse Practitioner

## 2019-07-31 ENCOUNTER — Ambulatory Visit: Payer: Managed Care, Other (non HMO) | Admitting: Nurse Practitioner

## 2019-07-31 ENCOUNTER — Other Ambulatory Visit: Payer: Self-pay

## 2019-07-31 VITALS — BP 124/80 | HR 80 | Temp 97.8°F | Ht 65.4 in | Wt 157.4 lb

## 2019-07-31 DIAGNOSIS — M542 Cervicalgia: Secondary | ICD-10-CM

## 2019-07-31 DIAGNOSIS — R591 Generalized enlarged lymph nodes: Secondary | ICD-10-CM

## 2019-07-31 DIAGNOSIS — Z1159 Encounter for screening for other viral diseases: Secondary | ICD-10-CM | POA: Diagnosis not present

## 2019-07-31 DIAGNOSIS — M5410 Radiculopathy, site unspecified: Secondary | ICD-10-CM

## 2019-07-31 MED ORDER — MELOXICAM 15 MG PO TABS: 15.0000 mg | ORAL_TABLET | Freq: Every day | ORAL | 2 refills | Status: DC

## 2019-07-31 MED ORDER — MELOXICAM 15 MG PO TABS: 15.0000 mg | ORAL_TABLET | Freq: Every day | ORAL | 2 refills | Status: AC

## 2019-07-31 MED ORDER — ORPHENADRINE CITRATE ER 100 MG PO TB12: 100.0000 mg | ORAL_TABLET | Freq: Two times a day (BID) | ORAL | 1 refills | Status: DC | PRN

## 2019-07-31 NOTE — Addendum Note (Signed)
Addended by: Minette Brine F on: 07/31/2019 05:33 PM   Modules accepted: Orders
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
## 2019-08-01 LAB — CBC WITH DIFFERENTIAL/PLATELET
Basophils Absolute: 0.1 10*3/uL (ref 0.0–0.2)
Basos: 1 %
EOS (ABSOLUTE): 0.4 10*3/uL (ref 0.0–0.4)
Eos: 5 %
Hematocrit: 41.4 % (ref 34.0–46.6)
Hemoglobin: 13.7 g/dL (ref 11.1–15.9)
Immature Grans (Abs): 0 10*3/uL (ref 0.0–0.1)
Immature Granulocytes: 0 %
Lymphocytes Absolute: 2.5 10*3/uL (ref 0.7–3.1)
Lymphs: 35 %
MCH: 28.4 pg (ref 26.6–33.0)
MCHC: 33.1 g/dL (ref 31.5–35.7)
MCV: 86 fL (ref 79–97)
Monocytes Absolute: 0.5 10*3/uL (ref 0.1–0.9)
Monocytes: 7 %
Neutrophils Absolute: 3.8 10*3/uL (ref 1.4–7.0)
Neutrophils: 52 %
Platelets: 238 10*3/uL (ref 150–450)
RBC: 4.83 x10E6/uL (ref 3.77–5.28)
RDW: 13.6 % (ref 11.7–15.4)
WBC: 7.2 10*3/uL (ref 3.4–10.8)

## 2019-08-01 LAB — BMP8+EGFR
BUN/Creatinine Ratio: 11 — ABNORMAL LOW (ref 12–28)
BUN: 8 mg/dL (ref 8–27)
CO2: 24 mmol/L (ref 20–29)
Calcium: 9.7 mg/dL (ref 8.7–10.3)
Chloride: 100 mmol/L (ref 96–106)
Creatinine, Ser: 0.72 mg/dL (ref 0.57–1.00)
GFR calc Af Amer: 105 mL/min/{1.73_m2} (ref >59)
GFR calc non Af Amer: 91 mL/min/{1.73_m2} (ref >59)
Glucose: 76 mg/dL (ref 65–99)
Potassium: 3.9 mmol/L (ref 3.5–5.2)
Sodium: 139 mmol/L (ref 134–144)

## 2019-08-01 LAB — TSH: TSH: 1.05 u[IU]/mL (ref 0.450–4.500)

## 2019-08-01 LAB — HEPATITIS C ANTIBODY: Hep C Virus Ab: <0.1 s/co ratio (ref 0.0–0.9)

## 2019-08-09 ENCOUNTER — Ambulatory Visit
Admission: RE | Admit: 2019-08-09 | Discharge: 2019-08-09 | Disposition: A | Discharge status: Home / Self Care | Payer: Managed Care, Other (non HMO) | Source: Ambulatory Visit | Attending: Nurse Practitioner | Admitting: Nurse Practitioner

## 2019-08-09 DIAGNOSIS — M542 Cervicalgia: Secondary | ICD-10-CM

## 2019-08-09 DIAGNOSIS — R591 Generalized enlarged lymph nodes: Secondary | ICD-10-CM

## 2019-08-16 ENCOUNTER — Other Ambulatory Visit: Payer: Self-pay | Admitting: Nurse Practitioner

## 2019-08-16 DIAGNOSIS — R591 Generalized enlarged lymph nodes: Secondary | ICD-10-CM

## 2019-08-29 ENCOUNTER — Other Ambulatory Visit: Payer: Self-pay | Admitting: Nurse Practitioner

## 2019-08-29 DIAGNOSIS — R591 Generalized enlarged lymph nodes: Secondary | ICD-10-CM

## 2019-08-29 DIAGNOSIS — M542 Cervicalgia: Secondary | ICD-10-CM

## 2019-09-01 ENCOUNTER — Other Ambulatory Visit: Payer: Managed Care, Other (non HMO)

## 2019-09-14 ENCOUNTER — Other Ambulatory Visit: Payer: Self-pay | Admitting: Otolaryngology

## 2019-09-14 DIAGNOSIS — R221 Localized swelling, mass and lump, neck: Secondary | ICD-10-CM

## 2019-09-29 ENCOUNTER — Other Ambulatory Visit: Payer: Self-pay

## 2019-09-29 ENCOUNTER — Ambulatory Visit
Admission: RE | Admit: 2019-09-29 | Discharge: 2019-09-29 | Disposition: A | Discharge status: Home / Self Care | Payer: Managed Care, Other (non HMO) | Source: Ambulatory Visit | Attending: Otolaryngology | Admitting: Otolaryngology

## 2019-09-29 DIAGNOSIS — R221 Localized swelling, mass and lump, neck: Secondary | ICD-10-CM

## 2019-09-29 MED ORDER — IOPAMIDOL (ISOVUE-300) INJECTION 61%
75.0000 mL | Freq: Once | INTRAVENOUS | Status: AC | PRN
  Administered 2019-09-29: 75 mL via INTRAVENOUS

## 2019-10-05 DIAGNOSIS — C3490 Malignant neoplasm of unspecified part of unspecified bronchus or lung: Secondary | ICD-10-CM

## 2019-10-05 HISTORY — DX: Malignant neoplasm of unspecified part of unspecified bronchus or lung: C34.90

## 2019-10-13 ENCOUNTER — Telehealth: Payer: Self-pay

## 2019-10-16 ENCOUNTER — Encounter: Payer: Self-pay | Admitting: Emergency Medicine

## 2019-10-16 ENCOUNTER — Ambulatory Visit: Payer: Managed Care, Other (non HMO) | Admitting: Emergency Medicine

## 2019-10-16 ENCOUNTER — Other Ambulatory Visit: Payer: Self-pay

## 2019-10-16 VITALS — BP 124/64 | HR 83 | Temp 97.8°F | Ht 65.5 in | Wt 148.0 lb

## 2019-10-16 DIAGNOSIS — R590 Localized enlarged lymph nodes: Secondary | ICD-10-CM

## 2019-10-16 NOTE — Assessment & Plan Note (Signed)
Left cervical lymphadenopathy, firm on palpation.  She has had increased dyspnea since it began to evolve.  She is also noticed a right axillary node and imaging has confirmed mediastinal lymphadenopathy.  All concerning for malignancy.  I will review her CT neck with interventional radiology to determine whether needle biopsy of this lesion would give the quickest, best information.  She needs a CT scan of the chest to better characterize her mediastinum and also look for other parenchymal abnormalities.  It may be possible to visualize the right axillary node on chest CT.  If so an axillary nodal resection would be a potential target as well.  If TTNA is not feasible then I can arrange for bronchoscopy with endobronchial ultrasound and mediastinal nodal biopsies.

## 2019-10-16 NOTE — Progress Notes (Signed)
Subjective:    Patient ID: Elizabeth Mayer, female    DOB: 1961-03-10, 59 y.o.   MRN: [id number]  HPI 59 year old former smoker (20 pack years), carries a history of childhood asthma. Mitral valve prolapse.  Possible CAD? Unclear evaluation, remote 40 yrs ago.  She has been evaluated by Dr. Wilburn Cornelia with ENT for left inferior neck adenopathy with associated dyspnea.  A CT scan of her neck was done 09/29/2019 which I have reviewed, shows multiple bilateral cervical nodes lymph nodes in the inferior aspect of the neck, extensive mediastinal adenopathy with areas of necrotic change. She has has exertional SOB since jan / feb. She noticed an increase in size of a 'Knot' left neck. She has now noticed a lump in her R axilla associated with some burning. No cough.    Review of Systems As per HPI  Past Medical History:  Diagnosis Date  . Angina 1982   related to stress  . Asthma    in the past, not now- never inhlaer use  . Heart murmur 1970s  . Ingrown toenail   . Past heart attack 1980-1981   pt states she passed out and woke up in hospital- told she had heart attack, but then dr said he couldn't find anything wrong.     Family History  Problem Relation Age of Onset  . Heart failure Father      Social History   Socioeconomic History  . Marital status: Married    Spouse name: Not on file  . Number of children: Not on file  . Years of education: Not on file  . Highest education level: Not on file  Occupational History  . Not on file  Tobacco Use  . Smoking status: Former Smoker    Packs/day: 1.00    Years: 20.00    Pack years: 20.00    Types: Cigarettes    Start date: 71    Quit date: 2001    Years since quitting: 20.5  . Smokeless tobacco: Never Used  Vaping Use  . Vaping Use: Never used  Substance and Sexual Activity  . Alcohol use: No  . Drug use: No  . Sexual activity: Yes    Birth control/protection: None  Other Topics Concern  . Not on file  Social History  Narrative  . Not on file   Social Determinants of Health   Financial Resource Strain:   . Difficulty of Paying Living Expenses:   Food Insecurity:   . Worried About Charity fundraiser in the Last Year:   . Arboriculturist in the Last Year:   Transportation Needs:   . Film/video editor (Medical):   Marland Kitchen Lack of Transportation (Non-Medical):   Physical Activity:   . Days of Exercise per Week:   . Minutes of Exercise per Session:   Stress:   . Feeling of Stress :   Social Connections:   . Frequency of Communication with Friends and Family:   . Frequency of Social Gatherings with Friends and Family:   . Attends Religious Services:   . Active Member of Clubs or Organizations:   . Attends Archivist Meetings:   Marland Kitchen Marital Status:   Intimate Partner Violence:   . Fear of Current or Ex-Partner:   . Emotionally Abused:   Marland Kitchen Physically Abused:   . Sexually Abused:      Allergies  Allergen Reactions  . Pseudoephedrine Hypertension  . Gabapentin   . Penicillins  REACTION: hives     Outpatient Medications Prior to Visit  Medication Sig Dispense Refill  . Ascorbic Acid (VITAMIN C ADULT GUMMIES PO) Take 1 capsule by mouth daily. (Patient not taking: Reported on 10/16/2019)    . BIOTIN PO Take 1 tablet by mouth daily.  (Patient not taking: Reported on 10/16/2019)    . cetirizine (ZYRTEC) 10 MG tablet Take by mouth. (Patient not taking: Reported on 10/16/2019)    . fluconazole (DIFLUCAN) 150 MG tablet Take 1 tablet by mouth now repeat in 5 days (Patient not taking: Reported on 07/31/2019) 2 tablet 0  . Methylsulfonylmethane (MSM PO) Take by mouth. daily (Patient not taking: Reported on 10/16/2019)    . orphenadrine (NORFLEX) 100 MG tablet Take 1 tablet (100 mg total) by mouth 2 (two) times daily as needed for muscle spasms. (Patient not taking: Reported on 10/16/2019) 60 tablet 1   No facility-administered medications prior to visit.       Objective:   Physical  Exam  Today's Vitals   10/16/19 1503  BP: 124/64  Pulse: 83  Temp: 97.8 F (36.6 C)  TempSrc: Oral  SpO2: 98%  Weight: 148 lb (67.1 kg)  Height: 5' 5.5" (1.664 m)   Body mass index is 24.25 kg/m.;sm  Gen: Pleasant, well-nourished, in no distress,  normal affect  ENT: No lesions,  mouth clear,  oropharynx clear, no postnasal drip  Neck: No JVD, no stridor, there is a firm somewhat tender oblong mass in the inferior left neck, supraclavicular region.  Lungs: No use of accessory muscles, no crackles or wheezing on normal respiration, no wheeze on forced expiration  Cardiovascular: RRR, heart sounds normal, no murmur or gallops, no peripheral edema  Musculoskeletal: No deformities, no cyanosis or clubbing  LAD: A firm mobile 1.5 cm node is palpable in the right axilla, nontender  Neuro: alert, awake, non focal  Skin: Warm, no lesions or rash     Assessment & Plan:  Lymphadenopathy of left cervical region Left cervical lymphadenopathy, firm on palpation.  She has had increased dyspnea since it began to evolve.  She is also noticed a right axillary node and imaging has confirmed mediastinal lymphadenopathy.  All concerning for malignancy.  I will review her CT neck with interventional radiology to determine whether needle biopsy of this lesion would give the quickest, best information.  She needs a CT scan of the chest to better characterize her mediastinum and also look for other parenchymal abnormalities.  It may be possible to visualize the right axillary node on chest CT.  If so an axillary nodal resection would be a potential target as well.  If TTNA is not feasible then I can arrange for bronchoscopy with endobronchial ultrasound and mediastinal nodal biopsies.  Baltazar Apo, MD, PhD 10/16/2019, 3:48 PM [facility] [phone] or if no answer [phone]

## 2019-10-16 NOTE — Patient Instructions (Signed)
We will arrange for a CT scan of the chest as it is possible Dr. Lamonte Sakai will review your films with interventional radiology to help determine which form of biopsy will be most useful, either a needle biopsy of your mass in the neck versus bronchoscopy to allow chest lymph node sampling. Follow with Dr Lamonte Sakai in 1 month

## 2019-10-17 ENCOUNTER — Ambulatory Visit: Payer: Managed Care, Other (non HMO) | Admitting: Nurse Practitioner

## 2019-10-17 ENCOUNTER — Ambulatory Visit
Admission: RE | Admit: 2019-10-17 | Discharge: 2019-10-17 | Disposition: A | Discharge status: Home / Self Care | Payer: Managed Care, Other (non HMO) | Source: Ambulatory Visit | Attending: Emergency Medicine | Admitting: Emergency Medicine

## 2019-10-17 ENCOUNTER — Telehealth: Payer: Self-pay

## 2019-10-17 ENCOUNTER — Encounter: Payer: Self-pay | Admitting: Nurse Practitioner

## 2019-10-17 VITALS — BP 118/72 | HR 92 | Temp 97.4°F | Ht 65.6 in | Wt 146.8 lb

## 2019-10-17 DIAGNOSIS — R590 Localized enlarged lymph nodes: Secondary | ICD-10-CM

## 2019-10-17 DIAGNOSIS — R634 Abnormal weight loss: Secondary | ICD-10-CM | POA: Diagnosis not present

## 2019-10-17 DIAGNOSIS — G510 Bell's palsy: Secondary | ICD-10-CM | POA: Diagnosis not present

## 2019-10-17 DIAGNOSIS — R591 Generalized enlarged lymph nodes: Secondary | ICD-10-CM | POA: Diagnosis not present

## 2019-10-17 DIAGNOSIS — H5702 Anisocoria: Secondary | ICD-10-CM

## 2019-10-17 MED ORDER — IOPAMIDOL (ISOVUE-300) INJECTION 61%
75.0000 mL | Freq: Once | INTRAVENOUS | Status: AC | PRN
  Administered 2019-10-17: 75 mL via INTRAVENOUS

## 2019-10-17 MED ORDER — HYDROCODONE-ACETAMINOPHEN 5-325 MG PO TABS: 1.0000 | ORAL_TABLET | Freq: Four times a day (QID) | ORAL | 0 refills | Status: DC | PRN

## 2019-10-17 MED ORDER — CYCLOBENZAPRINE HCL 10 MG PO TABS: 10.0000 mg | ORAL_TABLET | Freq: Three times a day (TID) | ORAL | 1 refills | Status: DC | PRN

## 2019-10-17 NOTE — Patient Instructions (Signed)
Patient advised to call back later to let us know if she had her CT scan if not we will send her to have a chest xray done.

## 2019-10-17 NOTE — Telephone Encounter (Signed)
Pt LVM she had CT scan of her chest done already that was the only thing that her insurance would pay for

## 2019-10-17 NOTE — Progress Notes (Signed)
I,Delonte Musich [employer] as a Education administrator for [employer], FNP.,have documented all relevant documentation on the behalf of Minette Brine, FNP,as directed by  Minette Brine, FNP while in the presence of Minette Brine, [place]. This visit occurred during the SARS-CoV-2 public health emergency.  Safety protocols were in place, including screening questions prior to the visit, additional usage of staff PPE, and extensive cleaning of exam room while observing appropriate contact time as indicated for disinfecting solutions.  Subjective:     Patient ID: Elizabeth Mayer , female    DOB: Mar 28, 1961 , 59 y.o.   MRN: [id number]   Chief Complaint  Patient presents with  . spot under arm    patient stated she has a bump under her right arm that appeared last week she stated it feels like it is burning     HPI  Patient is here because she has pain to her neck and a "bump" under right arm she denies drainage, her neck on left side has pain she rates it between 8-9. Taken muscle relaxers has some benefit but doesn't last. She stated her left eye looks like it is swollen and it will not open. Denies her eye being matted shut in the mornings. Her eye felt a little closed when she woke up this morning on the left. She has also been seen by ENT who referred her to pulmonology because he felt may be related to a pulmonary source. Pt doing CT scan today at [facility]. She did see the Pulmonologist yesterday who felt the lump under her right arm is a lymph node.  Wt Readings from Last 3 Encounters: 10/17/19 : 146 lb 12.8 oz (66.6 kg) 10/16/19 : 148 lb (67.1 kg) 07/31/19 : 157 lb 6.4 oz (71.4 kg)     Past Medical History:  Diagnosis Date  . Angina 1982   related to stress  . Asthma    in the past, not now- never inhlaer use  . Heart murmur 1970s  . Ingrown toenail   . Past heart attack 1980-1981   pt states she passed out and woke up in hospital- told she had heart attack, but then dr said he couldn't find  anything wrong.     Family History  Problem Relation Age of Onset  . Heart failure Father      Current Outpatient Medications:  .  orphenadrine (NORFLEX) 100 MG tablet, Take 1 tablet (100 mg total) by mouth 2 (two) times daily as needed for muscle spasms., Disp: 60 tablet, Rfl: 1   Allergies  Allergen Reactions  . Pseudoephedrine Hypertension  . Gabapentin   . Penicillins     REACTION: hives     Review of Systems  Constitutional: Positive for fatigue and unexpected weight change. Negative for fever.  Respiratory: Positive for cough (dry) and shortness of breath (when walking). Negative for chest tightness.   Cardiovascular: Positive for palpitations (when out of breath). Negative for chest pain and leg swelling.  Gastrointestinal: Negative for abdominal pain.  Neurological: Negative for dizziness and headaches.  Psychiatric/Behavioral: Negative.      Today's Vitals   10/17/19 0953  BP: 118/72  Pulse: 92  Temp: (!) 97.4 F (36.3 C)  TempSrc: Oral  Weight: 146 lb 12.8 oz (66.6 kg)  Height: 5' 5.6" (1.666 m)  PainSc: 8   PainLoc: Arm   Body mass index is 23.98 kg/m.   Objective:  Physical Exam Constitutional:      General: She is not in acute distress.  Appearance: Normal appearance. She is normal weight.  Eyes:     Extraocular Movements: Extraocular movements intact.     Pupils: Pupils are unequal.     Comments: Right pupil larger than left pupil  Cardiovascular:     Rate and Rhythm: Normal rate and regular rhythm.     Pulses: Normal pulses.     Heart sounds: Normal heart sounds.  Pulmonary:     Effort: Pulmonary effort is normal. No respiratory distress.     Breath sounds: Normal breath sounds.  Musculoskeletal:        General: Swelling (firm mass left neck) present.  Skin:    General: Skin is warm and dry.  Neurological:     General: No focal deficit present.     Mental Status: She is alert and oriented to person, place, and time.  Psychiatric:         Mood and Affect: Mood normal.        Behavior: Behavior normal.        Thought Content: Thought content normal.        Judgment: Judgment normal.         Assessment And Plan:     1. Bell's palsy  Left upper eye lid with drooping noted  Cranial nerve 7 decreased.   Will check CT scan without contrast as well.  - CT Head Wo Contrast; Future - HYDROcodone-acetaminophen (NORCO/VICODIN) 5-325 MG tablet; Take 1 tablet by mouth every 6 (six) hours as needed for moderate pain.  Dispense: 30 tablet; Refill: 0 - cyclobenzaprine (FLEXERIL) 10 MG tablet; Take 1 tablet (10 mg total) by mouth 3 (three) times daily as needed for muscle spasms.  Dispense: 30 tablet; Refill: 1  2. Pupillary size inequality  Right pupil measuring 42mm compared to left pupil 28mm both are reactive equally.  - CT Head Wo Contrast; Future  3. Abnormal weight loss  She has had a 11 lb weight loss since her visit in April  She is currently in the process of getting a CT scan of her lungs/abdomen and pelvis.  4. Lymphadenopathy  Noted to have lymphadenopathy to right axilla  [facility] [place], CMA   I, [facility] [place], [place], have reviewed all documentation for this visit. The documentation on 10/17/19 for the exam, diagnosis, procedures, and orders are all accurate and complete.  THE PATIENT IS ENCOURAGED TO PRACTICE SOCIAL DISTANCING DUE TO THE COVID-19 PANDEMIC.

## 2019-10-18 ENCOUNTER — Encounter: Payer: Self-pay | Admitting: Nurse Practitioner

## 2019-10-18 ENCOUNTER — Other Ambulatory Visit: Payer: Self-pay | Admitting: Nurse Practitioner

## 2019-10-18 ENCOUNTER — Encounter (HOSPITAL_COMMUNITY): Payer: Self-pay

## 2019-10-18 DIAGNOSIS — M542 Cervicalgia: Secondary | ICD-10-CM

## 2019-10-18 DIAGNOSIS — M5410 Radiculopathy, site unspecified: Secondary | ICD-10-CM

## 2019-10-18 MED ORDER — ORPHENADRINE CITRATE ER 100 MG PO TB12: 100.0000 mg | ORAL_TABLET | Freq: Two times a day (BID) | ORAL | 1 refills | Status: DC | PRN

## 2019-10-18 NOTE — Progress Notes (Signed)
Shayda A. Bredeson Female, 59 y.o., 04/02/1961 MRN:  [id number] Phone:  [phone] Jerilynn Mages) PCP:  Minette Brine, FNP Primary Cvg:  Med Pay/Med Pay Assurance Next Appt With Radiology (GI-315 CT 1) 11/01/2019 at 3:30 PM  RE: Biopsy Received: Yesterday Message Details  Corrie Mckusick, DO  [facility] [place] for US guided cervical mass biopsy.    Earleen Newport   Previous Messages  ----- Message -----  From: Lenore Cordia  Sent: 10/17/2019  3:04 PM EDT  To: Ir Procedure Requests  Subject: Biopsy                      Procedure Requested: US Guided Needle Placement    Reason for Procedure: Left Cervical Mass    Provider Requesting: Collene Gobble  Provider Telephone: [phone]

## 2019-10-19 ENCOUNTER — Telehealth: Payer: Self-pay

## 2019-10-19 NOTE — Telephone Encounter (Signed)
Jarrett Soho a notification rep with [facility] called on behalf of Elizabeth Mayer called to inform us that a request has been made and is pending an alternative recommendation Ref #[id number]. [phone]

## 2019-10-25 ENCOUNTER — Encounter: Payer: Self-pay | Admitting: Nurse Practitioner

## 2019-10-26 ENCOUNTER — Ambulatory Visit (HOSPITAL_COMMUNITY): Admission: RE | Admit: 2019-10-26 | Payer: No Typology Code available for payment source | Source: Ambulatory Visit

## 2019-10-27 ENCOUNTER — Ambulatory Visit: Payer: Managed Care, Other (non HMO) | Attending: Internal Medicine

## 2019-10-27 DIAGNOSIS — Z20822 Contact with and (suspected) exposure to covid-19: Secondary | ICD-10-CM

## 2019-10-28 LAB — NOVEL CORONAVIRUS, NAA: SARS-CoV-2, NAA: NOT DETECTED

## 2019-10-28 LAB — SARS-COV-2, NAA 2 DAY TAT

## 2019-10-30 ENCOUNTER — Ambulatory Visit: Payer: Managed Care, Other (non HMO) | Admitting: Nurse Practitioner

## 2019-10-30 ENCOUNTER — Other Ambulatory Visit: Payer: Self-pay | Admitting: Emergency Medicine

## 2019-10-31 ENCOUNTER — Other Ambulatory Visit: Payer: Self-pay | Admitting: Radiology

## 2019-11-01 ENCOUNTER — Encounter (HOSPITAL_COMMUNITY): Payer: Self-pay

## 2019-11-01 ENCOUNTER — Other Ambulatory Visit: Payer: Self-pay

## 2019-11-01 ENCOUNTER — Other Ambulatory Visit: Payer: Managed Care, Other (non HMO)

## 2019-11-01 ENCOUNTER — Ambulatory Visit (HOSPITAL_COMMUNITY)
Admission: RE | Admit: 2019-11-01 | Discharge: 2019-11-01 | Disposition: A | Discharge status: Home / Self Care | Payer: Managed Care, Other (non HMO) | Source: Ambulatory Visit | Attending: Emergency Medicine | Admitting: Emergency Medicine

## 2019-11-01 DIAGNOSIS — Z7951 Long term (current) use of inhaled steroids: Secondary | ICD-10-CM | POA: Diagnosis not present

## 2019-11-01 DIAGNOSIS — C770 Secondary and unspecified malignant neoplasm of lymph nodes of head, face and neck: Secondary | ICD-10-CM | POA: Diagnosis not present

## 2019-11-01 DIAGNOSIS — Z87891 Personal history of nicotine dependence: Secondary | ICD-10-CM | POA: Diagnosis not present

## 2019-11-01 DIAGNOSIS — C3481 Malignant neoplasm of overlapping sites of right bronchus and lung: Secondary | ICD-10-CM | POA: Diagnosis not present

## 2019-11-01 DIAGNOSIS — R590 Localized enlarged lymph nodes: Secondary | ICD-10-CM

## 2019-11-01 DIAGNOSIS — Z79899 Other long term (current) drug therapy: Secondary | ICD-10-CM | POA: Insufficient documentation

## 2019-11-01 DIAGNOSIS — R0602 Shortness of breath: Secondary | ICD-10-CM | POA: Insufficient documentation

## 2019-11-01 DIAGNOSIS — I252 Old myocardial infarction: Secondary | ICD-10-CM | POA: Diagnosis not present

## 2019-11-01 LAB — CBC
HCT: 41.1 % (ref 36.0–46.0)
Hemoglobin: 13.1 g/dL (ref 12.0–15.0)
MCH: 28.3 pg (ref 26.0–34.0)
MCHC: 31.9 g/dL (ref 30.0–36.0)
MCV: 88.8 fL (ref 80.0–100.0)
Platelets: 213 10*3/uL (ref 150–400)
RBC: 4.63 MIL/uL (ref 3.87–5.11)
RDW: 12.8 % (ref 11.5–15.5)
WBC: 5.8 10*3/uL (ref 4.0–10.5)
nRBC: 0 % (ref 0.0–0.2)

## 2019-11-01 LAB — PROTIME-INR
INR: 1 (ref 0.8–1.2)
Prothrombin Time: 13.2 seconds (ref 11.4–15.2)

## 2019-11-01 MED ORDER — FENTANYL CITRATE (PF) 100 MCG/2ML IJ SOLN
INTRAMUSCULAR | Status: AC | PRN
  Administered 2019-11-01: 50 ug via INTRAVENOUS

## 2019-11-01 MED ORDER — MIDAZOLAM HCL 2 MG/2ML IJ SOLN
INTRAMUSCULAR | Status: AC | PRN
  Administered 2019-11-01: 1 mg via INTRAVENOUS

## 2019-11-01 MED ORDER — LIDOCAINE HCL (PF) 1 % IJ SOLN
INTRAMUSCULAR | Status: AC
  Filled 2019-11-01: qty 30

## 2019-11-01 MED ORDER — FENTANYL CITRATE (PF) 100 MCG/2ML IJ SOLN
INTRAMUSCULAR | Status: AC
  Filled 2019-11-01: qty 2

## 2019-11-01 MED ORDER — MIDAZOLAM HCL 2 MG/2ML IJ SOLN
INTRAMUSCULAR | Status: AC
  Filled 2019-11-01: qty 2

## 2019-11-01 MED ORDER — SODIUM CHLORIDE 0.9 % IV SOLN: INTRAVENOUS | Status: DC

## 2019-11-01 NOTE — Discharge Instructions (Addendum)
Needle Biopsy, Care After These instructions tell you how to care for yourself after your procedure. Your doctor may also give you more specific instructions. Call your doctor if you have any problems or questions. What can I expect after the procedure? After the procedure, it is common to have:  Soreness.  Bruising.  Mild pain. Follow these instructions at home:   Return to your normal activities as told by your doctor. Ask your doctor what activities are safe for you.  Take over-the-counter and prescription medicines only as told by your doctor.  Wash your hands with soap and water before you change your bandage (dressing). If you cannot use soap and water, use hand sanitizer.  Follow instructions from your doctor about: ? How to take care of your puncture site. ? When and how to change your bandage. ? When to remove your bandage.  Check your puncture site every day for signs of infection. Watch for: ? Redness, swelling, or pain. ? Fluid or blood. ? Pus or a bad smell. ? Warmth.  Do not take baths, swim, or use a hot tub until your doctor approves. Ask your doctor if you may take showers. You may only be allowed to take sponge baths.  Keep all follow-up visits as told by your doctor. This is important. Contact a doctor if you have:  A fever.  Redness, swelling, or pain at the puncture site, and it lasts longer than a few days.  Fluid, blood, or pus coming from the puncture site.  Warmth coming from the puncture site. Get help right away if:  You have a lot of bleeding from the puncture site. Summary  After the procedure, it is common to have soreness, bruising, or mild pain at the puncture site.  Check your puncture site every day for signs of infection, such as redness, swelling, or pain.  Get help right away if you have severe bleeding from your puncture site. This information is not intended to replace advice given to you by your health care provider. Make  sure you discuss any questions you have with your health care provider. Document Revised: 04/05/2017 Document Reviewed: 04/05/2017 Elsevier Patient Education  [place]. Moderate Conscious Sedation, Adult Sedation is the use of medicines to promote relaxation and relieve discomfort and anxiety. Moderate conscious sedation is a type of sedation. Under moderate conscious sedation, you are less alert than normal, but you are still able to respond to instructions, touch, or both. Moderate conscious sedation is used during short medical and dental procedures. It is milder than deep sedation, which is a type of sedation under which you cannot be easily woken up. It is also milder than general anesthesia, which is the use of medicines to make you unconscious. Moderate conscious sedation allows you to return to your regular activities sooner. Tell a health care provider about:  Any allergies you have.  All medicines you are taking, including vitamins, herbs, eye drops, creams, and over-the-counter medicines.  Use of steroids (by mouth or creams).  Any problems you or family members have had with sedatives and anesthetic medicines.  Any blood disorders you have.  Any surgeries you have had.  Any medical conditions you have, such as sleep apnea.  Whether you are pregnant or may be pregnant.  Any use of cigarettes, alcohol, marijuana, or street drugs. What are the risks? Generally, this is a safe procedure. However, problems may occur, including:  Getting too much medicine (oversedation).  Nausea.  Allergic reaction to  medicines.  Trouble breathing. If this happens, a breathing tube may be used to help with breathing. It will be removed when you are awake and breathing on your own.  Heart trouble.  Lung trouble. What happens before the procedure? Staying hydrated Follow instructions from your health care provider about hydration, which may include:  Up to 2 hours before the  procedure - you may continue to drink clear liquids, such as water, clear fruit juice, black coffee, and plain tea. Eating and drinking restrictions Follow instructions from your health care provider about eating and drinking, which may include:  8 hours before the procedure - stop eating heavy meals or foods such as meat, fried foods, or fatty foods.  6 hours before the procedure - stop eating light meals or foods, such as toast or cereal.  6 hours before the procedure - stop drinking milk or drinks that contain milk.  2 hours before the procedure - stop drinking clear liquids. Medicine Ask your health care provider about:  Changing or stopping your regular medicines. This is especially important if you are taking diabetes medicines or blood thinners.  Taking medicines such as aspirin and ibuprofen. These medicines can thin your blood. Do not take these medicines before your procedure if your health care provider instructs you not to.  Tests and exams  You will have a physical exam.  You may have blood tests done to show: ? How well your kidneys and liver are working. ? How well your blood can clot. General instructions  Plan to have someone take you home from the hospital or clinic.  If you will be going home right after the procedure, plan to have someone with you for 24 hours. What happens during the procedure?  An IV tube will be inserted into one of your veins.  Medicine to help you relax (sedative) will be given through the IV tube.  The medical or dental procedure will be performed. What happens after the procedure?  Your blood pressure, heart rate, breathing rate, and blood oxygen level will be monitored often until the medicines you were given have worn off.  Do not drive for 24 hours. This information is not intended to replace advice given to you by your health care provider. Make sure you discuss any questions you have with your health care provider. Document  Revised: 03/05/2017 Document Reviewed: 07/13/2015 Elsevier Patient Education  2020 [employer].

## 2019-11-01 NOTE — Procedures (Signed)
Interventional Radiology Procedure Note  Procedure: [place] CORE BX LEFT NECK NODAL MASS    Complications: None  Estimated Blood Loss:  MIN  Findings: 18 G CORES X 5    M. Daryll Brod, MD

## 2019-11-01 NOTE — H&P (Signed)
Chief Complaint: Patient was seen in consultation today for left cervical lymph node biopsy at the request of Byrum,Robert S  Referring Physician(s): Baltazar Apo S  Supervising Physician: Sandi Mariscal  Patient Status: [facility] - Out-pt  History of Present Illness: Elizabeth Mayer is a 59 y.o. female   Former smoker- for 20 yrs Has seen ENT MD regarding cervical LAN- enlarging and painful Also notes shortness of breath  US neck 08/2019-- revealing enlarged cervical LN  CT neck 09/29/19: IMPRESSION: 1. Pathologic adenopathy in the neck compatible with metastatic disease. There is a large lymph node on the left. No pharyngeal mass 2. There is extensive mediastinal adenopathy. Findings may represent lung carcinoma. Recommend follow-up CT chest with contrast  CT Chest 7/13: IMPRESSION: 1. Right perihilar lung mass is identified which encases and occludes the right lower lobe pulmonary artery. There is also encasement of the right upper lobe, right middle lobe and right lower lobe bronchi with evidence of postobstructive atelectasis of the right middle lobe and subsegmental atelectasis of the right lower lobe. Findings are worrisome for primary bronchogenic carcinoma. 2. Pleural base nodule within the paramediastinal right upper lobe worrisome for additional site of disease. 3. Extensive bilateral mediastinal, right hilar, bilateral supraclavicular, and right axillary adenopathy. 4. Indeterminate left adrenal nodule measures 1 cm. Cannot rule out metastatic disease.  Evaluation with Dr Lamonte Sakai 10/16/19:  Left cervical lymphadenopathy, firm on palpation.  She has had increased dyspnea since it began to evolve.  She is also noticed a right axillary node and imaging has confirmed mediastinal lymphadenopathy.  All concerning for malignancy.  I will review her CT neck with interventional radiology to determine whether needle biopsy of this lesion would give the quickest, best information.    Scheduled now for left cervical LN bx    Past Medical History:  Diagnosis Date  . Angina 1982   related to stress  . Asthma    in the past, not now- never inhlaer use  . Heart murmur 1970s  . Ingrown toenail   . Past heart attack 1980-1981   pt states she passed out and woke up in hospital- told she had heart attack, but then dr said he couldn't find anything wrong.    Past Surgical History:  Procedure Laterality Date  . NECK SURGERY  2000   Disc removed from neck     Allergies: Pseudoephedrine, Gabapentin, Mobic [meloxicam], and Penicillins  Medications: Prior to Admission medications   Medication Sig Start Date End Date Taking? Authorizing Provider  acetaminophen (TYLENOL) 500 MG tablet Take 1,000 mg by mouth every 6 (six) hours as needed for moderate pain.   Yes [provider]  Carboxymethylcellul-Glycerin (LUBRICATING EYE DROPS OP) Place 1 drop into both eyes daily as needed (irritation).   Yes [provider]  cetirizine (ZYRTEC) 10 MG tablet Take 10 mg by mouth daily.   Yes [provider]  cyclobenzaprine (FLEXERIL) 10 MG tablet Take 10 mg by mouth 3 (three) times daily as needed for muscle spasms.   Yes [provider]  fluticasone (FLONASE) 50 MCG/ACT nasal spray Place 2 sprays into both nostrils daily.   Yes [provider]  guaiFENesin (MUCINEX) 600 MG 12 hr tablet Take 600 mg by mouth 2 (two) times daily.   Yes [provider]  HYDROcodone-acetaminophen (NORCO/VICODIN) 5-325 MG tablet Take 1 tablet by mouth every 6 (six) hours as needed for moderate pain. 10/17/19  Yes Minette Brine, FNP  orphenadrine (NORFLEX) 100 MG tablet Take 1  tablet (100 mg total) by mouth 2 (two) times daily as needed for muscle spasms. Patient taking differently: Take 100 mg by mouth 2 (two) times daily.  10/18/19 10/17/20 Yes Minette Brine, FNP  Ascorbic Acid (VITAMIN C PO) Take 1 tablet by mouth daily.    [provider]   BIOTIN PO Take 1 tablet by mouth daily.    [provider]  Methylsulfonylmethane (MSM PO) Take 1 tablet by mouth daily.    [provider]  Multiple Vitamins-Minerals (ZINC PO) Take 1 tablet by mouth daily.    [provider]     Family History  Problem Relation Age of Onset  . Heart failure Father     Social History   Socioeconomic History  . Marital status: Married    Spouse name: Not on file  . Number of children: Not on file  . Years of education: Not on file  . Highest education level: Not on file  Occupational History  . Not on file  Tobacco Use  . Smoking status: Former Smoker    Packs/day: 1.00    Years: 20.00    Pack years: 20.00    Types: Cigarettes    Start date: 26    Quit date: 2001    Years since quitting: 20.5  . Smokeless tobacco: Never Used  Vaping Use  . Vaping Use: Never used  Substance and Sexual Activity  . Alcohol use: No  . Drug use: No  . Sexual activity: Yes    Birth control/protection: None  Other Topics Concern  . Not on file  Social History Narrative  . Not on file   Social Determinants of Health   Financial Resource Strain:   . Difficulty of Paying Living Expenses:   Food Insecurity:   . Worried About Charity fundraiser in the Last Year:   . Arboriculturist in the Last Year:   Transportation Needs:   . Film/video editor (Medical):   Marland Kitchen Lack of Transportation (Non-Medical):   Physical Activity:   . Days of Exercise per Week:   . Minutes of Exercise per Session:   Stress:   . Feeling of Stress :   Social Connections:   . Frequency of Communication with Friends and Family:   . Frequency of Social Gatherings with Friends and Family:   . Attends Religious Services:   . Active Member of Clubs or Organizations:   . Attends Archivist Meetings:   Marland Kitchen Marital Status:     Review of Systems: A 12 point ROS discussed and pertinent positives are indicated in the HPI above.  All other systems  are negative.  Review of Systems  Constitutional: Positive for fatigue. Negative for fever.  HENT: Negative for sore throat and trouble swallowing.   Respiratory: Positive for shortness of breath. Negative for cough.   Cardiovascular: Negative for chest pain.  Gastrointestinal: Negative for abdominal pain.  Neurological: Negative for weakness.  Psychiatric/Behavioral: Negative for behavioral problems.    Vital Signs: BP (!) 134/89   Pulse 87   Temp 97.7 F (36.5 C) (Oral)   Resp 14   Ht 5\' 5"  (1.651 m)   Wt 148 lb (67.1 kg)   SpO2 100%   BMI 24.63 kg/m   Physical Exam Vitals reviewed.  Cardiovascular:     Rate and Rhythm: Normal rate and regular rhythm.     Heart sounds: Normal heart sounds.  Pulmonary:     Effort: Pulmonary effort is normal.  Breath sounds: Normal breath sounds.  Abdominal:     Palpations: Abdomen is soft.  Musculoskeletal:        General: Normal range of motion.  Skin:    General: Skin is warm.  Neurological:     Mental Status: She is alert and oriented to person, place, and time.  Psychiatric:        Behavior: Behavior normal.     Imaging: CT Chest W Contrast  Result Date: 10/17/2019 CLINICAL DATA:  Evaluate adenopathy. EXAM: CT CHEST WITH CONTRAST TECHNIQUE: Multidetector CT imaging of the chest was performed during intravenous contrast administration. CONTRAST:  63mL ISOVUE-300 IOPAMIDOL (ISOVUE-300) INJECTION 61% COMPARISON:  None. FINDINGS: Cardiovascular: Mild cardiac enlargement. Mild diffuse pericardial thickening identified, image 85/2. Aortic atherosclerosis. Mediastinum/Nodes: Thyroid gland is normal. The trachea remains patent and midline. The esophagus is unremarkable. Extensive thoracic adenopathy is identified. -index right axillary lymph node measures 1.7 cm, image 43/2. -nodal mass within the right supraclavicular region measures 2.3 cm, image 12/2. -Nodal mass within the left supraclavicular region measures 2.7 cm, image 1/2.  -Centered around the right paratracheal region is a large mass (or conglomeration of multiple enlarged lymph nodes) measuring 4.4 x 3.5 by 6.0 cm, image 45/2 and image 46/3. the mass partially encases the trachea, anterior arch of the aorta, SVC, and branch vessels off the aortic arch. -low left paratracheal lymph node measures 1.3 cm, image 52/2. -Left AP window lymph node measures 1.2 cm, image 61/2. -Right hilar lymph node measures 1.4 cm, image 59/2. Low-attenuation subcarinal lymph node measures 1.2 cm, image 68/2. Lungs/Pleura: No pleural effusion identified. Centrilobular and paraseptal emphysema. Right perihilar lung mass is noted measuring 3.7 x 3.6 by 2.7 cm, image 68/2. The mass encases and occludes the right lower lobe pulmonary artery. There is also encasement of the right upper lobe, right middle lobe and right lower lobe bronchi with evidence of postobstructive atelectasis of the right middle lobe and subsegmental atelectasis of the right lower lobe. Paramediastinal subpleural nodule within the right upper lobe measures 2.9 x 1.3 cm, image 64/2. Subsegmental atelectasis is noted within the left lower lobe without underlying obstructing lesion. Upper Abdomen: No acute abnormality noted within the imaged portions of the upper abdomen. Spleen is unremarkable. Indeterminate left adrenal nodule measures 1 cm, image 129/2. Musculoskeletal: No chest wall abnormality. No acute or significant osseous findings. IMPRESSION: 1. Right perihilar lung mass is identified which encases and occludes the right lower lobe pulmonary artery. There is also encasement of the right upper lobe, right middle lobe and right lower lobe bronchi with evidence of postobstructive atelectasis of the right middle lobe and subsegmental atelectasis of the right lower lobe. Findings are worrisome for primary bronchogenic carcinoma. 2. Pleural base nodule within the paramediastinal right upper lobe worrisome for additional site of disease.  3. Extensive bilateral mediastinal, right hilar, bilateral supraclavicular, and right axillary adenopathy. 4. Indeterminate left adrenal nodule measures 1 cm. Cannot rule out metastatic disease. 5. Emphysema and aortic atherosclerosis. Aortic Atherosclerosis (ICD10-I70.0) and Emphysema (ICD10-J43.9). Electronically Signed   By: Kerby Moors M.D.   On: 10/17/2019 14:44    Labs:  CBC: Recent Labs    01/19/19 1129 07/31/19 1518 11/01/19 1151  WBC 5.3 7.2 5.8  HGB 12.7 13.7 13.1  HCT 38.4 41.4 41.1  PLT 180 238 213    COAGS: Recent Labs    11/01/19 1151  INR 1.0    BMP: Recent Labs    01/19/19 1129 07/31/19 1518  NA 141 139  K 3.8 3.9  CL 104 100  CO2 25 24  GLUCOSE 82 76  BUN 5* 8  CALCIUM 8.9 9.7  CREATININE 0.83 0.72  GFRNONAA 77 91  GFRAA 89 105    LIVER FUNCTION TESTS: Recent Labs    01/19/19 1129  BILITOT 0.3  AST 13  ALT 6  ALKPHOS 39  PROT 6.4  ALBUMIN 4.0    TUMOR MARKERS: No results for input(s): AFPTM, CEA, CA199, CHROMGRNA in the last 8760 hours.  Assessment and Plan:  Former smoker-- SOB for weeks; enlarging left neck lymph node Consult with Pulmonary Dr Lamonte Sakai CT revealing Rt lung mass; mediastinal mass and extensive bilateral mediastinal, right hilar, bilateral supraclavicular, and right axillary adenopathy. Request for biopsy/tissue diagnosis Scheduled now for left cervical LAN biopsy Risks and benefits of left cervical lymph node biopsy was discussed with the patient and/or patient's family including, but not limited to bleeding, infection, damage to adjacent structures or low yield requiring additional tests.  All of the questions were answered and there is agreement to proceed. Consent signed and in chart.    Thank you for this interesting consult.  I greatly enjoyed meeting Elizabeth Mayer and look forward to participating in their care.  A copy of this report was sent to the requesting provider on this date.  Electronically  Signed: Lavonia Drafts, PA-C 11/01/2019, 12:28 PM   I spent a total of  30 Minutes   in face to face in clinical consultation, greater than 50% of which was counseling/coordinating care for left cervical LN bx

## 2019-11-02 ENCOUNTER — Other Ambulatory Visit: Payer: Managed Care, Other (non HMO)

## 2019-11-03 ENCOUNTER — Telehealth: Payer: Self-pay | Admitting: Emergency Medicine

## 2019-11-03 DIAGNOSIS — C3490 Malignant neoplasm of unspecified part of unspecified bronchus or lung: Secondary | ICD-10-CM

## 2019-11-03 LAB — SURGICAL PATHOLOGY

## 2019-11-03 NOTE — Telephone Encounter (Signed)
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  I will refer her to [facility] to be seen as soon as possible

## 2019-11-06 ENCOUNTER — Encounter: Payer: Self-pay | Admitting: *Deleted

## 2019-11-06 ENCOUNTER — Telehealth: Payer: Self-pay | Admitting: Physician Assistant

## 2019-11-06 DIAGNOSIS — R590 Localized enlarged lymph nodes: Secondary | ICD-10-CM

## 2019-11-06 NOTE — Telephone Encounter (Signed)
Received a new pt referral from Dr. 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 Pt has been cld and scheduled to see Cassie on 8/4 at 10am w/labs at 930am. Pt aware to arrive 15 minutes early.

## 2019-11-06 NOTE — Progress Notes (Signed)
I received referral on Elizabeth Mayer's today.  I updated new patient coordination to call and schedule her this week to be seen with Cassie.

## 2019-11-07 ENCOUNTER — Encounter: Payer: Self-pay | Admitting: Nurse Practitioner

## 2019-11-07 NOTE — Progress Notes (Signed)
[place] [phone]   [phone]) [phone]  CONSULT NOTE  REFERRING PHYSICIAN: Dr. Lamonte Sakai MD  REASON FOR CONSULTATION:  Non-Small Cell Lung Cancer, Adenocarcinoma  HPI Elizabeth Mayer is a 59 y.o. female with a past medical history significant for hx of tobacco use, degenerative disc disease, de Quervain's tenosynovitis, and genital herpes is referred to the clinic for evaluation of newly diagnosed non-small cell lung cancer, most consistent with adenocarcinoma.  The patient's evaluation began on 07/31/2019 after the patient presented to his PCP for the chief complaint of neck pain.  She states her neck pain started in December 2020; however, she did not have any visible changes until April 2021. On physical exam, it was noted that the patient had supraclavicular and cervical lymphadenopathy.  A ultrasound soft tissue of the neck was ordered.  This was performed on 5/5 which noted a 5.4 cm mass in the left neck which was concerning for a pathologically enlarged lymph node.  In the interval, the patient was referred to ENT to further evaluate the left inferior neck adenopathy.  The patient then had a CT scan soft tissue of the neck performed on 09/29/2019 which showed pathologic adenopathy in the neck compatible with metastatic disease.  There is also extensive mediastinal lymphadenopathy and a CT scan of the chest was recommended.  The patient saw Dr. Lamonte Sakai, pulmonologist, on 10/16/2019.  A CT scan of the chest, abdomen, and pelvis was ordered.  Unfortunately, due to insurance, the patient only had a CT scan of the chest performed which noted a right perihilar lung mass encasing and occluding the right lower lobe pulmonary artery. There was also encasement of the right lower lobe, right middle lobe, and right lower lobe bronchi without evidence of postobstructive atelectasis.  There was also a pleural-based nodule within the right paramediastinal right upper lobe.  There  is also extensive bilateral mediastinal, right hilar, bilateral supraclavicular and right axillary lymphadenopathy.  Dr. Lamonte Sakai arrange for the patient to have a ultrasound guided core biopsy of the left cervical nodal mass.  The pathology came back most consistent with adenocarcinoma carcinoma although the differential included adenosquamous.  The patient was subsequently referred to the clinic for further evaluation, recommended treatment options, and to complete the staging work-up.  Of note, it appears that the patient also had a follow-up appointment with her PCP who noted that the patient's left upper eyelid had some ptosis as well as an unequal pupillary size.  A CT scan of the head was ordered; however, it had not been scheduled at this point in time.  Today, the patient is the most concerned with her "sinus drainage" which is settling in her chest and she believes is the primary reason for her cough and shortness of breath.  She denies any recent fever, chills, night sweats or significant weight loss.  She denies any chest pain.  She notes that her sputum is pink in color.  She has been taking Mucinex without much relief for her cough or nasal drainage.  She denies any nausea, vomiting, or diarrhea.  She denies any headache or visual changes but she does have trouble opening her left eyelid in the morning due to the weakness.  She denies any diplopia or visual field defects.  She does report that her equilibrium feels off.  The patient's left cervical nodal metastasis is causing discomfort in her neck which she describes as a "pulling sensation".   Regarding her family medical history, the  patient reports that her mother is healthy.  The patient's father had congestive heart failure.  The patient's brother had a resection of part of his lung.  Patient is unsure if this is related to malignancy or not.  The patient has a sister with schizophrenia.  The patient works as a Secondary school teacher.  She quit  smoking approximately 20 years ago and estimates that she smoked 1 pack/day for approximately 20 years.  The patient is separated and does not have any children.  She denies any alcohol or drug use.   HPI  Past Medical History:  Diagnosis Date  . Angina 1982   related to stress  . Asthma    in the past, not now- never inhlaer use  . Heart murmur 1970s  . Ingrown toenail   . Past heart attack 1980-1981   pt states she passed out and woke up in hospital- told she had heart attack, but then dr said he couldn't find anything wrong.    Past Surgical History:  Procedure Laterality Date  . NECK SURGERY  2000   Disc removed from neck     Family History  Problem Relation Age of Onset  . Heart failure Father     Social History Social History   Tobacco Use  . Smoking status: Former Smoker    Packs/day: 1.00    Years: 20.00    Pack years: 20.00    Types: Cigarettes    Start date: 65    Quit date: 2001    Years since quitting: 20.6  . Smokeless tobacco: Never Used  Vaping Use  . Vaping Use: Never used  Substance Use Topics  . Alcohol use: No  . Drug use: No    Allergies  Allergen Reactions  . Pseudoephedrine Hypertension  . Gabapentin Other (See Comments)    Raise blood pressure and red rings around eyes Blood vessels popped in her eyes  . Mobic [Meloxicam] Swelling    Inflamed the area that has inflammation and stabbing pains in the area  . Penicillins Hives    Current Outpatient Medications  Medication Sig Dispense Refill  . acetaminophen (TYLENOL) 500 MG tablet Take 1,000 mg by mouth every 6 (six) hours as needed for moderate pain.    . Ascorbic Acid (VITAMIN C PO) Take 1 tablet by mouth daily.    Marland Kitchen BIOTIN PO Take 1 tablet by mouth daily.    . Carboxymethylcellul-Glycerin (LUBRICATING EYE DROPS OP) Place 1 drop into both eyes daily as needed (irritation).    . cyclobenzaprine (FLEXERIL) 10 MG tablet Take 10 mg by mouth 3 (three) times daily as needed for  muscle spasms.    . fluticasone (FLONASE) 50 MCG/ACT nasal spray Place 2 sprays into both nostrils daily.    Marland Kitchen guaiFENesin (MUCINEX) 600 MG 12 hr tablet Take 600 mg by mouth 2 (two) times daily.    Marland Kitchen HYDROcodone-acetaminophen (NORCO/VICODIN) 5-325 MG tablet Take 1 tablet by mouth every 6 (six) hours as needed for moderate pain. 30 tablet 0  . Methylsulfonylmethane (MSM PO) Take 1 tablet by mouth daily.    . Multiple Vitamin (MULTIVITAMIN) tablet Take 1 tablet by mouth daily.    . Multiple Vitamins-Minerals (ZINC PO) Take 1 tablet by mouth daily.    . orphenadrine (NORFLEX) 100 MG tablet Take 1 tablet (100 mg total) by mouth 2 (two) times daily as needed for muscle spasms. (Patient taking differently: Take 100 mg by mouth 2 (two) times daily. ) 60 tablet 1  .  cetirizine (ZYRTEC) 10 MG tablet Take 10 mg by mouth daily. (Patient not taking: Reported on 11/08/2019)    . LORazepam (ATIVAN) 0.5 MG tablet Take 1 tablet about 30 minutes before your brain MRI 2 tablet 0   No current facility-administered medications for this visit.    REVIEW OF SYSTEMS:   Review of Systems  Constitutional: Positive for fatigue. Negative for appetite change, chills, fever and unexpected weight change.  HENT: Negative for mouth sores, nosebleeds, sore throat and trouble swallowing.   Eyes: Positive for ptosis on left eyelid. Negative for diplopia, photophobia, visual field defects, or icterus.  Respiratory: Positive for cough, blood tinged sputum, and shortness of breath.  Negative for  wheezing.   Cardiovascular: Negative for chest pain and leg swelling.  Gastrointestinal: Positive for mild baseline constipation. Negative for abdominal pain, diarrhea, nausea and vomiting.  Genitourinary: Negative for bladder incontinence, difficulty urinating, dysuria, frequency and hematuria.   Musculoskeletal: Positive for neck pain and stiffness. Negative for back pain gait problem. Skin: Negative for itching and rash.  Neurological:  Negative for dizziness, extremity weakness, gait problem, headaches, light-headedness and seizures.  Hematological: Positive for bilateral cervical/supraclavicular adenopathy (L>R). Positive for right axillary adenopathy. Does not bruise/bleed easily.  Psychiatric/Behavioral: Negative for confusion, depression and sleep disturbance. The patient is not nervous/anxious.     PHYSICAL EXAMINATION:  Blood pressure 118/67, pulse 92, temperature 98.1 F (36.7 C), temperature source Temporal, resp. rate 18, height 5' 5"  (1.651 m), weight 144 lb 8 oz (65.5 kg), SpO2 100 %.  ECOG PERFORMANCE STATUS: 1  Physical Exam  Constitutional: Oriented to person, place, and time and well-developed, well-nourished, and in no distress.  HENT:  Head: Normocephalic and atraumatic.  Mouth/Throat: Oropharynx is clear and moist. No oropharyngeal exudate.  Eyes: Ptosis noted on left eyelid. Conjunctivae are normal. Right eye exhibits no discharge. Left eye exhibits no discharge. No scleral icterus.  Neck: Bilateral cervical/supraclavicular adenopathy (L>R). Range of motion limited by large neck mass. Neck supple.  Cardiovascular: Normal rate, regular rhythm, normal heart sounds and intact distal pulses.   Pulmonary/Chest: Effort normal. Decreased breath sounds in right lung. No respiratory distress. No wheezes. No rales.  Abdominal: Soft. Bowel sounds are normal. Exhibits no distension and no mass. There is no tenderness.  Musculoskeletal: Normal range of motion. Exhibits no edema.  Lymphadenopathy:    Right axillary, bilateral cervical and supraclavicular adenopathy..  Neurological: Alert and oriented to person, place, and time. Exhibits normal muscle tone. Gait normal. Coordination normal.  Skin: Skin is warm and dry. No rash noted. Not diaphoretic. No erythema. No pallor.  Psychiatric: Mood, memory and judgment normal.  Vitals reviewed.  LABORATORY DATA: Lab Results  Component Value Date   WBC 6.5 11/08/2019     HGB 13.1 11/08/2019   HCT 40.4 11/08/2019   MCV 88.4 11/08/2019   PLT 201 11/08/2019      Chemistry      Component Value Date/Time   NA 140 11/08/2019 0926   NA 139 07/31/2019 1518   K 3.5 11/08/2019 0926   CL 102 11/08/2019 0926   CO2 30 11/08/2019 0926   BUN 5 (L) 11/08/2019 0926   BUN 8 07/31/2019 1518   CREATININE 0.78 11/08/2019 0926      Component Value Date/Time   CALCIUM 9.7 11/08/2019 0926   ALKPHOS 56 11/08/2019 0926   AST 14 (L) 11/08/2019 0926   ALT 10 11/08/2019 0926   BILITOT 0.2 (L) 11/08/2019 0926       RADIOGRAPHIC  STUDIES: CT Chest W Contrast  Result Date: 10/17/2019 CLINICAL DATA:  Evaluate adenopathy. EXAM: CT CHEST WITH CONTRAST TECHNIQUE: Multidetector CT imaging of the chest was performed during intravenous contrast administration. CONTRAST:  85m ISOVUE-300 IOPAMIDOL (ISOVUE-300) INJECTION 61% COMPARISON:  None. FINDINGS: Cardiovascular: Mild cardiac enlargement. Mild diffuse pericardial thickening identified, image 85/2. Aortic atherosclerosis. Mediastinum/Nodes: Thyroid gland is normal. The trachea remains patent and midline. The esophagus is unremarkable. Extensive thoracic adenopathy is identified. -index right axillary lymph node measures 1.7 cm, image 43/2. -nodal mass within the right supraclavicular region measures 2.3 cm, image 12/2. -Nodal mass within the left supraclavicular region measures 2.7 cm, image 1/2. -Centered around the right paratracheal region is a large mass (or conglomeration of multiple enlarged lymph nodes) measuring 4.4 x 3.5 by 6.0 cm, image 45/2 and image 46/3. the mass partially encases the trachea, anterior arch of the aorta, SVC, and branch vessels off the aortic arch. -low left paratracheal lymph node measures 1.3 cm, image 52/2. -Left AP window lymph node measures 1.2 cm, image 61/2. -Right hilar lymph node measures 1.4 cm, image 59/2. Low-attenuation subcarinal lymph node measures 1.2 cm, image 68/2. Lungs/Pleura: No  pleural effusion identified. Centrilobular and paraseptal emphysema. Right perihilar lung mass is noted measuring 3.7 x 3.6 by 2.7 cm, image 68/2. The mass encases and occludes the right lower lobe pulmonary artery. There is also encasement of the right upper lobe, right middle lobe and right lower lobe bronchi with evidence of postobstructive atelectasis of the right middle lobe and subsegmental atelectasis of the right lower lobe. Paramediastinal subpleural nodule within the right upper lobe measures 2.9 x 1.3 cm, image 64/2. Subsegmental atelectasis is noted within the left lower lobe without underlying obstructing lesion. Upper Abdomen: No acute abnormality noted within the imaged portions of the upper abdomen. Spleen is unremarkable. Indeterminate left adrenal nodule measures 1 cm, image 129/2. Musculoskeletal: No chest wall abnormality. No acute or significant osseous findings. IMPRESSION: 1. Right perihilar lung mass is identified which encases and occludes the right lower lobe pulmonary artery. There is also encasement of the right upper lobe, right middle lobe and right lower lobe bronchi with evidence of postobstructive atelectasis of the right middle lobe and subsegmental atelectasis of the right lower lobe. Findings are worrisome for primary bronchogenic carcinoma. 2. Pleural base nodule within the paramediastinal right upper lobe worrisome for additional site of disease. 3. Extensive bilateral mediastinal, right hilar, bilateral supraclavicular, and right axillary adenopathy. 4. Indeterminate left adrenal nodule measures 1 cm. Cannot rule out metastatic disease. 5. Emphysema and aortic atherosclerosis. Aortic Atherosclerosis (ICD10-I70.0) and Emphysema (ICD10-J43.9). Electronically Signed   By: TKerby MoorsM.D.   On: 10/17/2019 14:44   [place] BIOPSY (SOFT TISSUE)  Result Date: 11/01/2019 INDICATION: Left cervical nodal mass, lung cancer suspected by imaging EXAM: ULTRASOUND LEFT CERVICAL NODAL  MASS 18 GAUGE CORE BIOPSY MEDICATIONS: 1% LIDOCAINE LOCAL ANESTHESIA/SEDATION: Moderate (conscious) sedation was employed during this procedure. A total of Versed 1.0 mg and Fentanyl 50 mcg was administered intravenously. Moderate Sedation Time: 10 minutes. The patient's level of consciousness and vital signs were monitored continuously by radiology nursing throughout the procedure under my direct supervision. FLUOROSCOPY TIME:  Fluoroscopy Time: NONE. COMPLICATIONS: None immediate. PROCEDURE: Informed written consent was obtained from the patient after a thorough discussion of the procedural risks, benefits and alternatives. All questions were addressed. Maximal Sterile Barrier Technique was utilized including caps, mask, sterile gowns, sterile gloves, sterile drape, hand hygiene and skin antiseptic. A timeout was performed  prior to the initiation of the procedure. Previous imaging reviewed. Preliminary ultrasound performed. The left cervical nodal mass was localized and marked. Under sterile conditions and local anesthesia, an 18 gauge biopsy was advanced to the left cervical nodal mass. 5 18 gauge core biopsies obtained. Samples were intact and non fragmented. These were placed in saline. Postprocedure imaging demonstrates no hemorrhage or hematoma. Patient tolerated the biopsy well. IMPRESSION: Successful ultrasound left cervical nodal mass 18 gauge core biopsies Electronically Signed   By: Jerilynn Mages.  Shick M.D.   On: 11/01/2019 14:06    ASSESSMENT: This is a very pleasant 59 year old African American female diagnosed with metastatic non-small cell lung cancer, most consistent with adenocarcinoma. The patient presented with a right perihilar lung mass which encases and occludes the right lower lobe pulmonary artery.  There is also associated case meant of the right upper lobe, right middle lobe, and right lower lobe bronchi.  The polyp patient also has associated bilateral mediastinal, right axillary, cervical, and  supraclavicular lymphadenopathy. The patient was diagnosed in July 2021. PDL1 and foundation one testing pendign.    PLAN: The patient was seen with Dr. Julien Nordmann today.  Dr. Julien Nordmann had a lengthy discussion with the patient about her current condition, recommended treatment options, and further staging work-up. Dr. Julien Nordmann recommends completing the staging work up with a PET scan and brain MRI. I discussed with the patient that she did not need to have the CT scan of the head performed since she will have a brain MRI performed.  Dr. Julien Nordmann reviewed the patient's CT scan of the neck and chest.  Dr. Julien Nordmann recommends that we refer the patient to radiation oncology for consideration of palliative radiotherapy to the obstructive lung mass.  I have placed an urgent referral.  We will also send the patient's tissue for molecular studies to assess if she is a candidate for oral chemotherapy.  If she is not a candidate for oral chemotherapy, Dr. Julien Nordmann will likely recommend that the patient undergo systemic chemotherapy.   I will arrange for a follow-up visit with the patient in 3 weeks for evaluation, to review her PET scan/brain MRI, and for more detailed discussion regarding her recommended treatment options pending the results from the patient's molecular studies.  In the meantime, she will likely be undergoing palliative radiotherapy.  The patient is claustrophobic and I have sent 2 tablets of Ativan 0.5 mg to the patient's pharmacy to take before her brain MRI.  I have also referred the patient to a Education officer, museum.  The patient had questions regarding being out of work while she undergoes further work-up and initial palliative treatment.   The patient voices understanding of current disease status and treatment options and is in agreement with the current care plan.  All questions were answered. The patient knows to call the clinic with any problems, questions or concerns. We can certainly see the  patient much sooner if necessary.  Thank you so much for allowing me to participate in the care of [place]. I will continue to follow up the patient with you and assist in her care.  The total time spent in the appointment was 60 minutes.  Disclaimer: This note was dictated with voice recognition software. Similar sounding words can inadvertently be transcribed and may not be corrected upon review.   Pranika Finks L Muskan Bolla November 08, 2019, 11:17 AM  ADDENDUM: Hematology/Oncology Attending: I had a face-to-face encounter with the patient today.  I recommended her care plan.  This is a very pleasant 59 years old African-American female who was recently diagnosed with lung cancer.  The patient mentions that in December 2020 she was complaining of neck pain.  She did not seek attention until March 2021 when she saw her primary care physician and was found to have neck lymphadenopathy.  She had ultrasound of the neck on 08/09/2019 that showed the palpable area of concern in the left neck corresponds to a solid-appearing 5.4 cm mass.  This was followed by referral to Dr. Wilburn Cornelia with ENT and CT scan of the neck on 09/29/2019 showed bilateral enlarged lymph nodes in the neck left greater than right with multiple nodes showing necrosis compatible with metastatic disease.  The largest node is a posterior lymph node on the left measuring 4.2 x 3.1 x 4.7 cm.  Additional posterior lymph node measured 1.2 cm with necrosis and supraclavicular lymph node on the left measuring 1.4 cm.  There was also several lymph nodes on the right including abnormal posterior lymph nodes in the right lower neck measuring 1.0 cm and 1.4 cm as well as 1.5 cm suspicious for metastatic disease.  The scan of the upper chest showed mediastinal lymphadenopathy including superior mediastinal lymph node measuring 3.1 x 3.8 cm and additional right upper lobe paratracheal lymph node measuring 1.3 cm.  There was also lymphadenopathy  extending into the carinal nodes and anterior mediastinal lymph nodes.  The patient was referred to Dr. Lamonte Sakai and on 10/17/2019 she had CT scan of the chest that showed centered around the right paratracheal region was a large mass or conglomeration of multiple enlarged lymph nodes measuring 4.4 x 3.5 x 6.0 cm.  The mass partially encases the trachea, anterior arch of the aorta, SVC and branch vessels of the aortic arch.  There was also a index right axillary lymph node measuring 1.7 cm.  A nodal mass within the right supraclavicular region measuring 2.3 cm, lower left paratracheal lymph node measuring 1.3 cm, left AP window lymph node measuring 1.2 cm, right hilar lymph node measuring 1.4 cm as well as low attenuation subcarinal lymph node measuring 1.2 cm.  There was also right perihilar lung mass measuring 3.7 x 3.6 x 2.7 cm which encases and occluding the right lower lobe pulmonary artery.  There is also encasement of the right upper lobe, right middle lobe and right lower lobe bronchi with evidence of postobstructive atelectasis of the right middle lobe and subsegmental atelectasis of the right lower lobe.  The scan also showed paramediastinal subpleural nodule within the right upper lobe measuring 2.9 x 1.3 cm.  On November 01, 2019 the patient underwent ultrasound-guided core biopsy of the left cervical nodal mass by interventional radiology and the final pathology [phone]) showed metastatic carcinoma. Immunohistochemical stains show that the tumor cells positive for TTF-1 and p63 (patchy). Tumor cells show focal staining for Napsin-A and CK 5/6. This immunohistochemical profile is most consistent with metastatic lung adenocarcinoma with differential diagnosis can also include metastatic adenosquamous carcinoma. Dr. Lamonte Sakai kindly referred the patient to me today for evaluation and recommendation regarding treatment of her condition. I had a lengthy discussion with the patient today about her current  disease stage, prognosis and treatment options. Unfortunately the patient has a stage IV (T2b, N3, M1b) non-small cell lung cancer presented with right perihilar mass in addition to bulky mediastinal and bilateral supraclavicular as well as axillary lymphadenopathy diagnosed in July 2021. I explained to the patient that she has incurable condition and all the  treatment will be of palliative nature. I recommended for the patient to have a PET scan as well as MRI of the brain to complete the staging work-up for her disease. I will also request her tissue block to be sent to [facility] for molecular studies and PD-L1 expression. I will refer the patient to radiation oncology for consideration of palliative course of radiotherapy to the bulky and obstructive mediastinal lymphadenopathy as well as the perihilar mass. I will see the patient back for follow-up visit in around 3 weeks for evaluation hopefully after completion of the palliative radiotherapy and staging work-up as well as the availability of the molecular studies to discuss with her her other treatment options including a combination of systemic chemotherapy plus immunotherapy versus targeted therapy if the patient has any actionable mutations versus palliative care. The patient agreed to the current plan. She was advised to call immediately if she has any other concerning symptoms in the interval.  Disclaimer: This note was dictated with voice recognition software. Similar sounding words can inadvertently be transcribed and may be missed upon review. Eilleen Kempf, MD 11/08/19

## 2019-11-08 ENCOUNTER — Encounter: Payer: Self-pay | Admitting: *Deleted

## 2019-11-08 ENCOUNTER — Other Ambulatory Visit: Payer: Self-pay

## 2019-11-08 ENCOUNTER — Encounter: Payer: Self-pay | Admitting: Physician Assistant

## 2019-11-08 ENCOUNTER — Inpatient Hospital Stay: Payer: Managed Care, Other (non HMO)

## 2019-11-08 ENCOUNTER — Inpatient Hospital Stay (HOSPITAL_BASED_OUTPATIENT_CLINIC_OR_DEPARTMENT_OTHER): Payer: Managed Care, Other (non HMO) | Admitting: Physician Assistant

## 2019-11-08 ENCOUNTER — Telehealth: Payer: Self-pay | Admitting: Physician Assistant

## 2019-11-08 DIAGNOSIS — Z510 Encounter for antineoplastic radiation therapy: Secondary | ICD-10-CM | POA: Insufficient documentation

## 2019-11-08 DIAGNOSIS — C3491 Malignant neoplasm of unspecified part of right bronchus or lung: Secondary | ICD-10-CM

## 2019-11-08 DIAGNOSIS — Z7189 Other specified counseling: Secondary | ICD-10-CM | POA: Insufficient documentation

## 2019-11-08 DIAGNOSIS — C3481 Malignant neoplasm of overlapping sites of right bronchus and lung: Secondary | ICD-10-CM | POA: Diagnosis present

## 2019-11-08 DIAGNOSIS — C770 Secondary and unspecified malignant neoplasm of lymph nodes of head, face and neck: Secondary | ICD-10-CM | POA: Diagnosis not present

## 2019-11-08 DIAGNOSIS — I252 Old myocardial infarction: Secondary | ICD-10-CM | POA: Insufficient documentation

## 2019-11-08 DIAGNOSIS — H02402 Unspecified ptosis of left eyelid: Secondary | ICD-10-CM | POA: Insufficient documentation

## 2019-11-08 DIAGNOSIS — Z79899 Other long term (current) drug therapy: Secondary | ICD-10-CM | POA: Insufficient documentation

## 2019-11-08 DIAGNOSIS — J9811 Atelectasis: Secondary | ICD-10-CM | POA: Insufficient documentation

## 2019-11-08 DIAGNOSIS — Z87891 Personal history of nicotine dependence: Secondary | ICD-10-CM | POA: Insufficient documentation

## 2019-11-08 DIAGNOSIS — C3490 Malignant neoplasm of unspecified part of unspecified bronchus or lung: Secondary | ICD-10-CM | POA: Insufficient documentation

## 2019-11-08 DIAGNOSIS — R05 Cough: Secondary | ICD-10-CM | POA: Insufficient documentation

## 2019-11-08 DIAGNOSIS — R590 Localized enlarged lymph nodes: Secondary | ICD-10-CM | POA: Insufficient documentation

## 2019-11-08 DIAGNOSIS — R5383 Other fatigue: Secondary | ICD-10-CM | POA: Insufficient documentation

## 2019-11-08 LAB — CBC WITH DIFFERENTIAL (CANCER CENTER ONLY)
Abs Immature Granulocytes: 0.01 10*3/uL (ref 0.00–0.07)
Basophils Absolute: 0.1 10*3/uL (ref 0.0–0.1)
Basophils Relative: 1 %
Eosinophils Absolute: 0.3 10*3/uL (ref 0.0–0.5)
Eosinophils Relative: 5 %
HCT: 40.4 % (ref 36.0–46.0)
Hemoglobin: 13.1 g/dL (ref 12.0–15.0)
Immature Granulocytes: 0 %
Lymphocytes Relative: 25 %
Lymphs Abs: 1.7 10*3/uL (ref 0.7–4.0)
MCH: 28.7 pg (ref 26.0–34.0)
MCHC: 32.4 g/dL (ref 30.0–36.0)
MCV: 88.4 fL (ref 80.0–100.0)
Monocytes Absolute: 0.4 10*3/uL (ref 0.1–1.0)
Monocytes Relative: 6 %
Neutro Abs: 4.1 10*3/uL (ref 1.7–7.7)
Neutrophils Relative %: 63 %
Platelet Count: 201 10*3/uL (ref 150–400)
RBC: 4.57 MIL/uL (ref 3.87–5.11)
RDW: 12.6 % (ref 11.5–15.5)
WBC Count: 6.5 10*3/uL (ref 4.0–10.5)
nRBC: 0 % (ref 0.0–0.2)

## 2019-11-08 LAB — CMP (CANCER CENTER ONLY)
ALT: 10 U/L (ref 0–44)
AST: 14 U/L — ABNORMAL LOW (ref 15–41)
Albumin: 3.6 g/dL (ref 3.5–5.0)
Alkaline Phosphatase: 56 U/L (ref 38–126)
Anion gap: 8 (ref 5–15)
BUN: 5 mg/dL — ABNORMAL LOW (ref 8–23)
CO2: 30 mmol/L (ref 22–32)
Calcium: 9.7 mg/dL (ref 8.9–10.3)
Chloride: 102 mmol/L (ref 98–111)
Creatinine: 0.78 mg/dL (ref 0.44–1.00)
GFR, Est AFR Am: >60 mL/min (ref >60)
GFR, Estimated: >60 mL/min (ref >60)
Glucose, Bld: 103 mg/dL — ABNORMAL HIGH (ref 70–99)
Potassium: 3.5 mmol/L (ref 3.5–5.1)
Sodium: 140 mmol/L (ref 135–145)
Total Bilirubin: 0.2 mg/dL — ABNORMAL LOW (ref 0.3–1.2)
Total Protein: 7.1 g/dL (ref 6.5–8.1)

## 2019-11-08 MED ORDER — LORAZEPAM 0.5 MG PO TABS
ORAL_TABLET | ORAL | 0 refills | Status: DC
Start: 2019-11-08 — End: 2019-11-14

## 2019-11-08 NOTE — Progress Notes (Signed)
I spoke with Elizabeth Mayer today.  She is newly dx stage IV lung cancer.  I gave and explained information on lung cancer.  Her tx plan is XRT then systemic therapy.  Moleculars and PDL 1 was sent today.

## 2019-11-08 NOTE — Telephone Encounter (Signed)
Scheduled per los. Declined printout  

## 2019-11-08 NOTE — Progress Notes (Signed)
Per Dr. Julien Nordmann, I notified path dept to send foundation one and PDL 1 on Ms. Hiser tissue case number [phone].

## 2019-11-08 NOTE — Patient Instructions (Addendum)
Summary:  -There are two main categories of lung cancer, they are named based on the size of the cancer cell. One is called Non-Small cell lung cancer. The other type is Small Cell Lung Cancer -The sample (biopsy) that they took of your tumor was consistent with a subtype of Non-small cell lung cancer called Adenocarcinoma with some squamous cell features. This is the most common type of lung cancer.  -We covered a lot of important information at your appointment today regarding what the treatment plan is moving forward. Here are the the main points that were discussed at your office visit with [place] today:    Referrals or Imaging: -Radiation oncology is going to call you. I have placed a referral. They are located in the basement of this building. They are going to contact you to talk about initiating radiation to the spot in the lung that is affecting your breathing. This will help you breath better -I also need you to get a PET scan and a brain MRI. If you do not hear from anyone from the radiology scheduling team by Monday 8/9 then please call this number [phone]. You just need to tell them your name and date of birth and that you are calling to schedule your PET scan and MRI. Please ask for the earliest availability.   Follow up:  -We will see you back for a follow up visit 3 weeks to go over the results of the imaging studies (PET and MRI) as well as to go over the special genetic tests to see what the best treatment option is for you  -If you need to reach [place] at any time, the main office number to the cancer center is [phone], when you call, ask to speak to either Cassie's or Dr. Worthy Flank nurse.

## 2019-11-10 ENCOUNTER — Encounter: Payer: Self-pay | Admitting: General Practice

## 2019-11-10 NOTE — Progress Notes (Signed)
[facility] Initial Psychosocial Assessment Clinical Social Work  Clinical Social Work contacted by phone to assess psychosocial, emotional, mental health, and spiritual needs of the patient.   Barriers to care/review of distress screen:  - Transportation:  Do you anticipate any problems getting to appointments?  Do you have someone who can help run errands for you if you need it?  Currently driving to appointments, may need help w transport at some point.   - Help at home:  What is your living situation (alone, family, other)?  If you are physically unable to care for yourself, who would you call on to help you?  Lives by herself.  - Support system:  What does your support system look like?  Who would you call on if you needed some kind of practical help?  What if you needed someone to talk to for emotional support?  Reports she has support from brother, neighbor, several friends.  Has church that is out of town. - Finances:  Are you concerned about finances.  Considering returning to work?  If not, applying for disability?  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    What is your understanding of where you are with your cancer? Its cause?  Your treatment plan and what happens next?  Newly diagnosed w Stage IV lung cancer.  Has been in process of diagnostics since April 2021 when she first saw her PCP w concerning symptoms.  She is out of work for past two weeks.  She is in process of treatment planning and scheduling.    What are your worries for the future as you begin treatment for cancer?  Financial worries, "break in income switching over from my income to disability", has car payment, car insurance, upkeep and similar.    CSW Summary:  Patient and family psychosocial functioning including strengths, limitations, and coping skills:  59 year old female, no  children, separated, newly diagnosed w Stage IV lung cancer.  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  Encouraged to identify any available resources and conserve funds as she is able to do so.  Encouraged to investigate any support available to her from her employee benefits or other outside sources.  CSW and patient discussed common feeling and emotions when being diagnosed with cancer, and the importance of support during treatment. CSW informed patient of the support team and support services at [facility]. CSW provided contact information and encouraged patient to call with any questions or concerns.  Will email patient copies of [place] calendar, encouraged to participate in programming.    Identifications of barriers to care:  Potential loss of income and insurance coverage; lives alone, unclear who can support her if she needs help at home  Availability of community resources:  Referred to [place], Oncologist, [employer].  Asked her to investigate benefits available through employer as well.    Clinical Social Worker follow up needed: No. please reconsult if needed.  Edwyna Shell, LCSW Clinical Social Worker Phone:  [phone] Cell:  [phone]

## 2019-11-12 NOTE — Progress Notes (Signed)
Radiation Oncology         [phone] ________________________________  Initial Outpatient Consultation  Name: Elizabeth Mayer MRN: [id number]  Date: 11/13/2019  DOB: [birth year]  CC:Minette Brine, FNP  Heilingoetter, Cassandr*   REFERRING PHYSICIAN: Heilingoetter, Cassandr*  DIAGNOSIS: The encounter diagnosis was Non-small cell lung cancer, right ([place]).  Metastatic non-small cell right lung cancer, adenocarcinoma vs adenosquamous carcinoma  HISTORY OF PRESENT ILLNESS::Elizabeth Mayer is a 59 y.o. female who is seen as a Publishing copy, PA-C, for an opinion concerning radiation therapy as part of management for her recently diagnosed lung cancer. Today, she is accompanied by her brother. The patient presented to Minette Brine, FNP, on 07/31/2019 with compliant of left-sided neck pain. On examination, the patient's neck was noted to have a firm area with associated lymphadenopathy to the supraclavicular region, anterior chin, and posterior chin on the left side of the neck. Soft tissue ultrasound of the head and neck on 08/09/2019 showed a palpable area of concern in the left neck that corresponded to a solid-appearing 5.4 cm mass. There were additional abnormal lymph nodes in the right cervical region that were concerning for nodal metastatic disease.  Soft tissue CT scan of the neck on 09/29/2019 showed pathologic adenopathy in the neck that was compatible with metastatic disease. There was a large lymph node on the left without pharyngeal mass. Additionally, there was noted to be extensive mediastinal adenopathy.  The patient was referred to Dr. Lamonte Sakai and was seen in consultation on 10/16/2019. At that time, it was recommend that the patient proceed with a chest CT scan to better characterize her mediastinum and also look for other parenchymal abnormalities. They also discussed transthoracic needle aspiration vs bronchoscopy with endobronchial ultrasound and mediastinal  nodal biopsies.  Chest CT scan on 10/17/2019 showed a right perihilar lung mass that encased and occluded the right lower lobe pulmonary artery. There was also encasement of the right upper lobe, right middle lobe, and right lower lobe bronchi with evidence of postobstructive atelectasis of the right middle lobe and subsegmental atelectasis of the right lower lobe. Findings were worrisome for primary bronchogenic carcinoma. Additionally, there was a pleural base nodule within the para-mediastinal right upper lobe that was worrisome for an additional site of disease. There was also extensive bilateral mediastinal, right hilar, bilateral supraclavicular, and right axillary adenopathy. Finally, there was an indeterminate 1 cm left adrenal nodule.   The patient underwent an ultrasound-guided core biopsy of the left neck nodal mass on 11/01/2019 that was performed by Dr. Annamaria Boots. Pathology from the procedure revealed metastatic carcinoma. The immunohistochemical profile was most consistent with metastatic lung adenocarcinoma with differential diagnosis of metastatic adenosquamous carcinoma.  The patient was referred to medical oncology and was seen in consultation with Dr. Julien Nordmann and [facility], PA-C, on 11/08/2019. At that time, it was recommended that she complete the staging work-up with a PET scan and brain MRI. She was also referred to radiation oncology for consideration of palliative radiotherapy to the obstructive right lung mass. Finally, tissue was sent for molecular studies to assess if she is a candidate for oral chemotherapy. If not, she will likely undergo systemic chemotherapy.    PREVIOUS RADIATION THERAPY: No  PAST MEDICAL HISTORY:  Past Medical History:  Diagnosis Date  . Angina 1982   related to stress  . Asthma    in the past, not now- never inhlaer use  . Heart murmur 1970s  . Ingrown toenail   . Past heart  attack [phone]   pt states she passed out and woke up in  hospital- told she had heart attack, but then dr said he couldn't find anything wrong.    PAST SURGICAL HISTORY: Past Surgical History:  Procedure Laterality Date  . NECK SURGERY  2000   Disc removed from neck     FAMILY HISTORY:  Family History  Problem Relation Age of Onset  . Heart failure Father     SOCIAL HISTORY:  Social History   Tobacco Use  . Smoking status: Former Smoker    Packs/day: 1.00    Years: 20.00    Pack years: 20.00    Types: Cigarettes    Start date: 14    Quit date: 2001    Years since quitting: 20.6  . Smokeless tobacco: Never Used  Vaping Use  . Vaping Use: Never used  Substance Use Topics  . Alcohol use: No  . Drug use: No    ALLERGIES:  Allergies  Allergen Reactions  . Pseudoephedrine Hypertension  . Gabapentin Other (See Comments)    Raise blood pressure and red rings around eyes Blood vessels popped in her eyes  . Mobic [Meloxicam] Swelling    Inflamed the area that has inflammation and stabbing pains in the area  . Penicillins Hives    MEDICATIONS:  Current Outpatient Medications  Medication Sig Dispense Refill  . acetaminophen (TYLENOL) 500 MG tablet Take 1,000 mg by mouth every 6 (six) hours as needed for moderate pain.    . Ascorbic Acid (VITAMIN C PO) Take 1 tablet by mouth daily.    Marland Kitchen BIOTIN PO Take 1 tablet by mouth daily.    . Carboxymethylcellul-Glycerin (LUBRICATING EYE DROPS OP) Place 1 drop into both eyes daily as needed (irritation).    . cetirizine (ZYRTEC) 10 MG tablet Take 10 mg by mouth daily. (Patient not taking: Reported on 11/08/2019)    . cyclobenzaprine (FLEXERIL) 10 MG tablet Take 10 mg by mouth 3 (three) times daily as needed for muscle spasms.    . fluticasone (FLONASE) 50 MCG/ACT nasal spray Place 2 sprays into both nostrils daily.    Marland Kitchen guaiFENesin (MUCINEX) 600 MG 12 hr tablet Take 600 mg by mouth 2 (two) times daily.    Marland Kitchen HYDROcodone-acetaminophen (NORCO/VICODIN) 5-325 MG tablet Take 1 tablet by mouth  every 6 (six) hours as needed for moderate pain. 30 tablet 0  . LORazepam (ATIVAN) 0.5 MG tablet Take 1 tablet about 30 minutes before your brain MRI 2 tablet 0  . Methylsulfonylmethane (MSM PO) Take 1 tablet by mouth daily.    . Multiple Vitamin (MULTIVITAMIN) tablet Take 1 tablet by mouth daily.    . Multiple Vitamins-Minerals (ZINC PO) Take 1 tablet by mouth daily.    . orphenadrine (NORFLEX) 100 MG tablet Take 1 tablet (100 mg total) by mouth 2 (two) times daily as needed for muscle spasms. 60 tablet 1   No current facility-administered medications for this encounter.    REVIEW OF SYSTEMS:  A 10+ POINT REVIEW OF SYSTEMS WAS OBTAINED including neurology, dermatology, psychiatry, cardiac, respiratory, lymph, extremities, GI, GU, musculoskeletal, constitutional, reproductive, HEENT.  She reports her left eyelid to be sluggish with difficulty opening and closing.  She denies any double vision or visual deficits.  She denies any headaches.   PHYSICAL EXAM:  height is 5\' 5"  (1.651 m) and weight is 143 lb 2 oz (64.9 kg). Her oral temperature is 98.2 F (36.8 C). Her blood pressure is 127/76 and her pulse  is 86. Her respiration is 18 and oxygen saturation is 98%.   General: Alert and oriented, in no acute distress HEENT: Head is normocephalic. Extraocular movements are intact. Oropharynx is clear.  Slight ptosis to the left eye, left pupil slightly smaller than the right Neck: Neck is supple, extensive adenopathy along left lower neck and supraclavicular region estimated to be at least 5 cm in size.  Patient also has significant adenopathy along the right lower neck/supraclavicular area measuring approximately 3 x 4 cm in size Heart: Regular in rate and rhythm with no murmurs, rubs, or gallops. Chest: Decreased breath sounds noted in the right lung field.  No wheezing Abdomen: Soft, nontender, nondistended, with no rigidity or guarding. Extremities: No cyanosis or edema. Lymphatics: see Neck  Exam Skin: No concerning lesions. Musculoskeletal: symmetric strength and muscle tone throughout. Neurologic: Cranial nerves II through XII are grossly intact. No obvious focalities. Speech is fluent. Coordination is intact. Psychiatric: Judgment and insight are intact. Affect is appropriate.   ECOG = 1  0 - Asymptomatic (Fully active, able to carry on all predisease activities without restriction)  1 - Symptomatic but completely ambulatory (Restricted in physically strenuous activity but ambulatory and able to carry out work of a light or sedentary nature. For example, light housework, office work)  2 - Symptomatic, <50% in bed during the day (Ambulatory and capable of all self care but unable to carry out any work activities. Up and about more than 50% of waking hours)  3 - Symptomatic, >50% in bed, but not bedbound (Capable of only limited self-care, confined to bed or chair 50% or more of waking hours)  4 - Bedbound (Completely disabled. Cannot carry on any self-care. Totally confined to bed or chair)  5 - Death   Eustace Pen MM, Creech RH, Tormey DC, et al. [phone]). "Toxicity and response criteria of the [facility]". [place] Oncol. 5 (6): 649-55  LABORATORY DATA:  Lab Results  Component Value Date   WBC 6.5 11/08/2019   HGB 13.1 11/08/2019   HCT 40.4 11/08/2019   MCV 88.4 11/08/2019   PLT 201 11/08/2019   NEUTROABS 4.1 11/08/2019   Lab Results  Component Value Date   NA 140 11/08/2019   K 3.5 11/08/2019   CL 102 11/08/2019   CO2 30 11/08/2019   GLUCOSE 103 (H) 11/08/2019   CREATININE 0.78 11/08/2019   CALCIUM 9.7 11/08/2019      RADIOGRAPHY: CT Chest W Contrast  Result Date: 10/17/2019 CLINICAL DATA:  Evaluate adenopathy. EXAM: CT CHEST WITH CONTRAST TECHNIQUE: Multidetector CT imaging of the chest was performed during intravenous contrast administration. CONTRAST:  67mL ISOVUE-300 IOPAMIDOL (ISOVUE-300) INJECTION 61% COMPARISON:  None.  FINDINGS: Cardiovascular: Mild cardiac enlargement. Mild diffuse pericardial thickening identified, image 85/2. Aortic atherosclerosis. Mediastinum/Nodes: Thyroid gland is normal. The trachea remains patent and midline. The esophagus is unremarkable. Extensive thoracic adenopathy is identified. -index right axillary lymph node measures 1.7 cm, image 43/2. -nodal mass within the right supraclavicular region measures 2.3 cm, image 12/2. -Nodal mass within the left supraclavicular region measures 2.7 cm, image 1/2. -Centered around the right paratracheal region is a large mass (or conglomeration of multiple enlarged lymph nodes) measuring 4.4 x 3.5 by 6.0 cm, image 45/2 and image 46/3. the mass partially encases the trachea, anterior arch of the aorta, SVC, and branch vessels off the aortic arch. -low left paratracheal lymph node measures 1.3 cm, image 52/2. -Left AP window lymph node measures 1.2 cm, image 61/2. -  Right hilar lymph node measures 1.4 cm, image 59/2. Low-attenuation subcarinal lymph node measures 1.2 cm, image 68/2. Lungs/Pleura: No pleural effusion identified. Centrilobular and paraseptal emphysema. Right perihilar lung mass is noted measuring 3.7 x 3.6 by 2.7 cm, image 68/2. The mass encases and occludes the right lower lobe pulmonary artery. There is also encasement of the right upper lobe, right middle lobe and right lower lobe bronchi with evidence of postobstructive atelectasis of the right middle lobe and subsegmental atelectasis of the right lower lobe. Paramediastinal subpleural nodule within the right upper lobe measures 2.9 x 1.3 cm, image 64/2. Subsegmental atelectasis is noted within the left lower lobe without underlying obstructing lesion. Upper Abdomen: No acute abnormality noted within the imaged portions of the upper abdomen. Spleen is unremarkable. Indeterminate left adrenal nodule measures 1 cm, image 129/2. Musculoskeletal: No chest wall abnormality. No acute or significant osseous  findings. IMPRESSION: 1. Right perihilar lung mass is identified which encases and occludes the right lower lobe pulmonary artery. There is also encasement of the right upper lobe, right middle lobe and right lower lobe bronchi with evidence of postobstructive atelectasis of the right middle lobe and subsegmental atelectasis of the right lower lobe. Findings are worrisome for primary bronchogenic carcinoma. 2. Pleural base nodule within the paramediastinal right upper lobe worrisome for additional site of disease. 3. Extensive bilateral mediastinal, right hilar, bilateral supraclavicular, and right axillary adenopathy. 4. Indeterminate left adrenal nodule measures 1 cm. Cannot rule out metastatic disease. 5. Emphysema and aortic atherosclerosis. Aortic Atherosclerosis (ICD10-I70.0) and Emphysema (ICD10-J43.9). Electronically Signed   By: Kerby Moors M.D.   On: 10/17/2019 14:44   [place] CORE BIOPSY (SOFT TISSUE)  Result Date: 11/01/2019 INDICATION: Left cervical nodal mass, lung cancer suspected by imaging EXAM: ULTRASOUND LEFT CERVICAL NODAL MASS 18 GAUGE CORE BIOPSY MEDICATIONS: 1% LIDOCAINE LOCAL ANESTHESIA/SEDATION: Moderate (conscious) sedation was employed during this procedure. A total of Versed 1.0 mg and Fentanyl 50 mcg was administered intravenously. Moderate Sedation Time: 10 minutes. The patient's level of consciousness and vital signs were monitored continuously by radiology nursing throughout the procedure under my direct supervision. FLUOROSCOPY TIME:  Fluoroscopy Time: NONE. COMPLICATIONS: None immediate. PROCEDURE: Informed written consent was obtained from the patient after a thorough discussion of the procedural risks, benefits and alternatives. All questions were addressed. Maximal Sterile Barrier Technique was utilized including caps, mask, sterile gowns, sterile gloves, sterile drape, hand hygiene and skin antiseptic. A timeout was performed prior to the initiation of the procedure. Previous  imaging reviewed. Preliminary ultrasound performed. The left cervical nodal mass was localized and marked. Under sterile conditions and local anesthesia, an 18 gauge biopsy was advanced to the left cervical nodal mass. 5 18 gauge core biopsies obtained. Samples were intact and non fragmented. These were placed in saline. Postprocedure imaging demonstrates no hemorrhage or hematoma. Patient tolerated the biopsy well. IMPRESSION: Successful ultrasound left cervical nodal mass 18 gauge core biopsies Electronically Signed   By: Jerilynn Mages.  Shick M.D.   On: 11/01/2019 14:06      IMPRESSION: Metastatic non-small cell right lung cancer, adenocarcinoma vs adenosquamous carcinoma.   Staging work-up pending at this time with PET scan and brain MRI scheduled for later this week.  She would be a good candidate for short course of palliative radiation therapy directed at the right central chest area given the encasement and occlusion of the right lower lobe pulmonary artery as well as encasement of the right upper,  right middle and right lower lobe bronchi with  evidence of postobstructive atelectasis.  She is also having significant discomfort/pain from her extensive adenopathy in the left and right lower neck regions.  She would be a candidate for palliative radiation therapy directed at this region at the same time as her chest radiation treatment.  Today, I talked to the patient and brother about the findings and work-up thus far.  We discussed the natural history of lung cancer and general treatment, highlighting the role of radiotherapy in the management.  We discussed the available radiation techniques, and focused on the details of logistics and delivery.  We reviewed the anticipated acute and late sequelae associated with radiation in this setting.  The patient was encouraged to ask questions that I answered to the best of my ability.  A patient consent form was discussed and signed.  We retained a copy for our records.   The patient would like to proceed with radiation and will be scheduled for CT simulation.  PLAN: The patient is scheduled to undergo a PET scan on 0811/2021 and a brain MRI on 11/17/2019.  She will proceed with CT simulation later this morning with treatments to begin on August 11.  Anticipate 14 treatments directed at the right central chest and lower neck region.  Total time spent in this encounter was 65 minutes which included reviewing the patient's most recent consultations, ultrasound, CT scans, biopsy/pathology, physical examination, and documentation.  ------------------------------------------------  Blair Promise, PhD, MD  This document serves as a record of services personally performed by Gery Pray, MD. It was created on his behalf by Clerance Lav, a trained medical scribe. The creation of this record is based on the scribe's personal observations and the provider's statements to them. This document has been checked and approved by the attending provider.

## 2019-11-13 ENCOUNTER — Encounter: Payer: Self-pay | Admitting: *Deleted

## 2019-11-13 ENCOUNTER — Other Ambulatory Visit: Payer: Self-pay | Admitting: Nurse Practitioner

## 2019-11-13 ENCOUNTER — Other Ambulatory Visit: Payer: Self-pay

## 2019-11-13 ENCOUNTER — Ambulatory Visit
Admission: RE | Admit: 2019-11-13 | Discharge: 2019-11-13 | Disposition: A | Discharge status: Home / Self Care | Payer: Managed Care, Other (non HMO) | Source: Ambulatory Visit | Attending: Radiation Oncology | Admitting: Radiation Oncology

## 2019-11-13 ENCOUNTER — Encounter: Payer: Self-pay | Admitting: Radiation Oncology

## 2019-11-13 VITALS — BP 127/76 | HR 86 | Temp 98.2°F | Resp 18 | Ht 65.0 in | Wt 143.1 lb

## 2019-11-13 DIAGNOSIS — C770 Secondary and unspecified malignant neoplasm of lymph nodes of head, face and neck: Secondary | ICD-10-CM | POA: Diagnosis not present

## 2019-11-13 DIAGNOSIS — C3491 Malignant neoplasm of unspecified part of right bronchus or lung: Secondary | ICD-10-CM | POA: Diagnosis present

## 2019-11-13 DIAGNOSIS — M5410 Radiculopathy, site unspecified: Secondary | ICD-10-CM

## 2019-11-13 DIAGNOSIS — Z87891 Personal history of nicotine dependence: Secondary | ICD-10-CM | POA: Insufficient documentation

## 2019-11-13 DIAGNOSIS — M542 Cervicalgia: Secondary | ICD-10-CM

## 2019-11-13 MED ORDER — ORPHENADRINE CITRATE ER 100 MG PO TB12: 100.0000 mg | ORAL_TABLET | Freq: Two times a day (BID) | ORAL | 1 refills | Status: DC | PRN

## 2019-11-13 NOTE — Progress Notes (Signed)
Name: [name]          MRN: [id number]         Date: 11/13/2019                        DOB: [birth year]  CC:Elizabeth Brine, FNP  Heilingoetter, Cassandr*   REFERRING PHYSICIAN: Heilingoetter, Cassandr*  DIAGNOSIS: There were no encounter diagnoses.  Metastatic non-small cell right lung cancer, adenocarcinoma vs adenosquamous carcinoma  HISTORY OF PRESENT ILLNESS::Elizabeth Mayer is a 59 y.o. female who is seen as a Publishing copy, Mayer, for an opinion concerning radiation therapy as part of management for her recently diagnosed lung cancer. Today, she is accompanied by her brother. The patient presented to Elizabeth Brine, FNP, on 07/31/2019 with compliant of left-sided neck pain. On examination, the patient's neck was noted to have a firm area with Elizabeth lymphadenopathy to the supraclavicular region, anterior chin, and posterior chin on the left side of the neck. Soft tissue ultrasound of the head and neck on 08/09/2019 showed a palpable area of concern in the left neck that corresponded to a solid-appearing 5.4 cm mass. There were additional abnormal lymph nodes in the right cervical region that were concerning for nodal metastatic disease.  Soft tissue CT scan of the neck on 09/29/2019 showed pathologic adenopathy in the neck that was compatible with metastatic disease. There was a large lymph node on the left without pharyngeal mass. Additionally, there was noted to be extensive mediastinal adenopathy.  The patient was referred to Dr. Lamonte Mayer and was seen in consultation on 10/16/2019. At that time, it was recommend that the patient proceed with a chest CT scan to better characterize her mediastinum and also look for other parenchymal abnormalities. They also discussed transthoracic needle aspiration vs bronchoscopy with endobronchial ultrasound and mediastinal nodal biopsies.  Chest CT scan on 10/17/2019 showed a right perihilar lung mass that encased and occluded  the right lower lobe pulmonary artery. There was also encasement of the right upper lobe, right middle lobe, and right lower lobe bronchi with evidence of postobstructive atelectasis of the right middle lobe and subsegmental atelectasis of the right lower lobe. Findings were worrisome for primary bronchogenic carcinoma. Additionally, there was a pleural base nodule within the para-mediastinal right upper lobe that was worrisome for an additional site of disease. There was also extensive bilateral mediastinal, right hilar, bilateral supraclavicular, and right axillary adenopathy. Finally, there was an indeterminate 1 cm left adrenal nodule.   The patient underwent an ultrasound-guided core biopsy of the left neck nodal mass on 11/01/2019 that was performed by Dr. Annamaria Mayer. Pathology from the procedure revealed metastatic carcinoma. The immunohistochemical profile was most consistent with metastatic lung adenocarcinoma with differential diagnosis of metastatic adenosquamous carcinoma.  The patient was referred to medical oncology and was seen in consultation with Elizabeth Mayer and Elizabeth Mayer, on 11/08/2019. At that time, it was recommended that she complete the staging work-up with a PET scan and brain MRI. She was also referred to radiation oncology for consideration of palliative radiotherapy to the obstructive right lung mass. Finally, tissue was sent for molecular studies to assess if she is a candidate for oral chemotherapy. If not, she will likely undergo systemic chemotherapy.    PREVIOUS RADIATION THERAPY: No  PAST MEDICAL HISTORY:      Past Medical History:  Diagnosis Date  . Angina 1982   related to stress  . Asthma    in the past,  not now- never inhlaer use  . Heart murmur 1970s  . Ingrown toenail   . Past heart attack 1980-1981   pt states she passed out and woke up in hospital- told she had heart attack, but then dr said he couldn't find anything wrong.     PAST SURGICAL HISTORY:      Past Surgical History:  Procedure Laterality Date  . NECK SURGERY  2000   Disc removed from neck      Past/Anticipated interventions by medical oncology, if any: waiting to see  Signs/Symptoms  Weight changes, if any: lost 60 lbs in 6 months  Respiratory complaints, if any: cough productive of phlegm  Hemoptysis, if any: small amount on one occasion  Pain issues, if any: left neck pain  SAFETY ISSUES:  Prior radiation?no  Pacemaker/ICD? no  Possible current pregnancy? postmenopausal  Is the patient on methotrexate? no  Current Complaints / other details:  Patient here with her brother. BP 127/76 (BP Location: Left Arm, Patient Position: Sitting)   Pulse 86   Temp 98.2 F (36.8 C) (Oral)   Resp 18   Ht 5\' 5"  (1.651 m)   Wt 143 lb 2 oz (64.9 kg)   SpO2 98%   BMI 23.82 kg/m   Wt Readings from Last 3 Encounters:  11/13/19 143 lb 2 oz (64.9 kg)  11/08/19 144 lb 8 oz (65.5 kg)  11/01/19 148 lb (67.1 kg)

## 2019-11-13 NOTE — Progress Notes (Signed)
[facility] Psychosocial Distress Screening Clinical Social Work  Clinical Social Work was referred by distress screening protocol.  The patient scored a 8 on the Psychosocial Distress Thermometer which indicates moderate distress.  Please see note from 11/10/2019.  Clinical Social Worker  Completed full psychosocial assessment.  All needs and concerns were addressed.     ONCBCN DISTRESS SCREENING 11/13/2019  Screening Type Initial Screening  Distress experienced in past week (1-10) 8  Physician notified of physical symptoms Yes  Referral to clinical social work Yes    Johnnye Lana, MSW, LCSW, OSW-C Clinical Social Worker [facility] [phone]

## 2019-11-13 NOTE — Progress Notes (Signed)
  Radiation Oncology         [phone] ________________________________  Name: Elizabeth Mayer MRN: [id number]  Date: 11/15/2019  DOB: 1960-07-29  Simulation Verification Note    ICD-10-CM   1. Non-small cell lung cancer, right ([employer])  C34.91     NARRATIVE: The patient was brought to the treatment unit and placed in the planned treatment position. The clinical setup was verified. Then port films were obtained and uploaded to the radiation oncology medical record software.  The treatment beams were carefully compared against the planned radiation fields. The position location and shape of the radiation fields was reviewed. The targeted volume of tissue appears to be appropriately covered by the radiation beams. Organs at risk appear to be excluded as planned.  Based on my personal review, I approved the simulation verification. The patient's treatment will proceed as planned.  -----------------------------------  Blair Promise, PhD, MD  This document serves as a record of services personally performed by Gery Pray, MD. It was created on his behalf by Clerance Lav, a trained medical scribe. The creation of this record is based on the scribe's personal observations and the provider's statements to them. This document has been checked and approved by the attending provider.

## 2019-11-14 ENCOUNTER — Telehealth: Payer: Self-pay

## 2019-11-14 ENCOUNTER — Ambulatory Visit: Payer: Managed Care, Other (non HMO) | Admitting: Nurse Practitioner

## 2019-11-14 ENCOUNTER — Encounter: Payer: Self-pay | Admitting: Nurse Practitioner

## 2019-11-14 VITALS — BP 118/72 | HR 86 | Temp 97.4°F | Ht 65.6 in | Wt 143.2 lb

## 2019-11-14 DIAGNOSIS — R590 Localized enlarged lymph nodes: Secondary | ICD-10-CM

## 2019-11-14 DIAGNOSIS — M542 Cervicalgia: Secondary | ICD-10-CM

## 2019-11-14 DIAGNOSIS — G510 Bell's palsy: Secondary | ICD-10-CM

## 2019-11-14 DIAGNOSIS — C3491 Malignant neoplasm of unspecified part of right bronchus or lung: Secondary | ICD-10-CM | POA: Diagnosis not present

## 2019-11-14 DIAGNOSIS — M5410 Radiculopathy, site unspecified: Secondary | ICD-10-CM | POA: Diagnosis not present

## 2019-11-14 DIAGNOSIS — Z510 Encounter for antineoplastic radiation therapy: Secondary | ICD-10-CM | POA: Diagnosis not present

## 2019-11-14 DIAGNOSIS — Z1211 Encounter for screening for malignant neoplasm of colon: Secondary | ICD-10-CM

## 2019-11-14 MED ORDER — HYDROCODONE-ACETAMINOPHEN 5-325 MG PO TABS: 1.0000 | ORAL_TABLET | Freq: Four times a day (QID) | ORAL | 0 refills | Status: DC | PRN

## 2019-11-14 NOTE — Telephone Encounter (Signed)
Looked at dispense history for medications

## 2019-11-14 NOTE — Progress Notes (Signed)
Rutherford Nail as a scribe for Minette Brine, FNP.,have documented all relevant documentation on the behalf of Minette Brine, FNP,as directed by  Minette Brine, FNP while in the presence of Minette Brine, [place].  This visit occurred during the SARS-CoV-2 public health emergency. Safety protocols were in place, including screening questions prior to the visit, additional usage of staff PPE, and extensive cleaning of exam room while observing appropriate contact time as indicated for disinfecting solutions.  Subjective:     Patient ID: Elizabeth Mayer , female    DOB: [birth year] , 59 y.o.   MRN: [id number]   Chief Complaint  Patient presents with   Neck Pain    f/u    HPI  She is here today to follow up on her neck pain.  She has had her CT scans and MRI of her neck and chest, which revealed non small cell lung cancer. She is to start radiation tomorrow daily for 3 weeks.  She continues to have neck pain and discomfort. The orphenadrine has been effective but wears off quickly.  She lives alone but has help from her siblings.     Past Medical History:  Diagnosis Date   Angina 1982   related to stress   Asthma    in the past, not now- never inhlaer use   Heart murmur 1970s   Ingrown toenail    Past heart attack 1980-1981   pt states she passed out and woke up in hospital- told she had heart attack, but then dr said he couldn't find anything wrong.     Family History  Problem Relation Age of Onset   Heart failure Father      Current Outpatient Medications:    acetaminophen (TYLENOL) 500 MG tablet, Take 1,000 mg by mouth every 6 (six) hours as needed for moderate pain., Disp: , Rfl:    Carboxymethylcellul-Glycerin (LUBRICATING EYE DROPS OP), Place 1 drop into both eyes daily as needed (irritation)., Disp: , Rfl:    cyclobenzaprine (FLEXERIL) 10 MG tablet, Take 10 mg by mouth 3 (three) times daily as needed for muscle spasms., Disp: , Rfl:    fluticasone (FLONASE) 50  MCG/ACT nasal spray, Place 2 sprays into both nostrils daily., Disp: , Rfl:    guaiFENesin (MUCINEX) 600 MG 12 hr tablet, Take 600 mg by mouth 2 (two) times daily., Disp: , Rfl:    HYDROcodone-acetaminophen (NORCO/VICODIN) 5-325 MG tablet, Take 1-2 tablets by mouth every 6 (six) hours as needed for moderate pain., Disp: 60 tablet, Rfl: 0   orphenadrine (NORFLEX) 100 MG tablet, Take 1 tablet (100 mg total) by mouth 2 (two) times daily as needed for muscle spasms., Disp: 60 tablet, Rfl: 1   ondansetron (ZOFRAN) 4 MG tablet, Take 1 tablet (4 mg total) by mouth every 8 (eight) hours as needed for nausea or vomiting., Disp: 20 tablet, Rfl: 0   prochlorperazine (COMPAZINE) 10 MG tablet, Take 1 tablet (10 mg total) by mouth every 6 (six) hours as needed., Disp: 30 tablet, Rfl: 2   Allergies  Allergen Reactions   Pseudoephedrine Hypertension   Gabapentin Other (See Comments)    Raise blood pressure and red rings around eyes Blood vessels popped in her eyes   Mobic [Meloxicam] Swelling    Inflamed the area that has inflammation and stabbing pains in the area   Penicillins Hives     Review of Systems  Constitutional: Negative.   HENT:       Left neck pain  Respiratory: Negative.   Cardiovascular: Negative.  Negative for chest pain, palpitations and leg swelling.  Neurological: Negative for dizziness and headaches.  Psychiatric/Behavioral: Negative.      Today's Vitals   11/14/19 0917  BP: 118/72  Pulse: 86  Temp: (!) 97.4 F (36.3 C)  TempSrc: Oral  Weight: 143 lb 3.2 oz (65 kg)  Height: 5' 5.6" (1.666 m)   Body mass index is 23.4 kg/m.   Objective:  Physical Exam Vitals reviewed.  Constitutional:      General: She is not in acute distress.    Appearance: Normal appearance. She is normal weight.  Eyes:     Extraocular Movements: Extraocular movements intact.     Pupils: Pupils are unequal.     Comments: Right pupil larger than left pupil  Neck:     Comments: She  has firm nodule to left side of neck Cardiovascular:     Rate and Rhythm: Normal rate and regular rhythm.     Pulses: Normal pulses.     Heart sounds: Normal heart sounds.  Pulmonary:     Effort: Pulmonary effort is normal. No respiratory distress.     Breath sounds: Normal breath sounds.  Musculoskeletal:        General: Swelling (firm mass left neck) present.     Cervical back: Rigidity present. No edema. Pain with movement and muscular tenderness present.  Lymphadenopathy:     Cervical: Cervical adenopathy present.  Skin:    General: Skin is warm and dry.  Neurological:     General: No focal deficit present.     Mental Status: She is alert and oriented to person, place, and time.  Psychiatric:        Mood and Affect: Mood normal.        Behavior: Behavior normal.        Thought Content: Thought content normal.        Judgment: Judgment normal.         Assessment And Plan:     1. Non-small cell lung cancer, right [facility]) This is a new diagnosis and she will begin radiation this week At this time she is in good spirits.  - HYDROcodone-acetaminophen (NORCO/VICODIN) 5-325 MG tablet; Take 1-2 tablets by mouth every 6 (six) hours as needed for moderate pain.  Dispense: 60 tablet; Refill: 0  2. Lymphadenopathy of left cervical region Firm nodules to left submandibular nodes  3. Neck pain Will treat with muscle relaxer and hydrocodone, reviewed PDMP  4. Radiculopathy of arm Likely related to the cervical nodule  5. Bell's palsy Advised this may improve with improvement of the shrinking the tumor - HYDROcodone-acetaminophen (NORCO/VICODIN) 5-325 MG tablet; Take 1-2 tablets by mouth every 6 (six) hours as needed for moderate pain.  Dispense: 60 tablet; Refill: 0  6. Encounter for screening colonoscopy  According to USPTF Colorectal cancer Screening guidelines. Colonoscopy is recommended every 10 years, starting at age 32years.  Will refer to GI for colon cancer screening. -  Ambulatory referral to Gastroenterology     Patient was given opportunity to ask questions. Patient verbalized understanding of the plan and was able to repeat key elements of the plan. All questions were answered to their satisfaction.  Minette Brine, FNP   I, Minette Brine, FNP, have reviewed all documentation for this visit. The documentation on 11/22/19 for the exam, diagnosis, procedures, and orders are all accurate and complete.   THE PATIENT IS ENCOURAGED TO PRACTICE SOCIAL DISTANCING DUE TO THE COVID-19 PANDEMIC.

## 2019-11-15 ENCOUNTER — Ambulatory Visit
Admission: RE | Admit: 2019-11-15 | Discharge: 2019-11-15 | Disposition: A | Discharge status: Home / Self Care | Payer: Managed Care, Other (non HMO) | Source: Ambulatory Visit | Attending: Radiation Oncology | Admitting: Radiation Oncology

## 2019-11-15 ENCOUNTER — Other Ambulatory Visit: Payer: Self-pay

## 2019-11-15 ENCOUNTER — Ambulatory Visit (HOSPITAL_COMMUNITY): Payer: Managed Care, Other (non HMO)

## 2019-11-15 DIAGNOSIS — Z510 Encounter for antineoplastic radiation therapy: Secondary | ICD-10-CM | POA: Diagnosis not present

## 2019-11-15 DIAGNOSIS — C3491 Malignant neoplasm of unspecified part of right bronchus or lung: Secondary | ICD-10-CM

## 2019-11-16 ENCOUNTER — Ambulatory Visit
Admission: RE | Admit: 2019-11-16 | Discharge: 2019-11-16 | Disposition: A | Discharge status: Home / Self Care | Payer: Managed Care, Other (non HMO) | Source: Ambulatory Visit | Attending: Radiation Oncology | Admitting: Radiation Oncology

## 2019-11-16 ENCOUNTER — Other Ambulatory Visit: Payer: Self-pay

## 2019-11-16 DIAGNOSIS — Z510 Encounter for antineoplastic radiation therapy: Secondary | ICD-10-CM | POA: Diagnosis not present

## 2019-11-17 ENCOUNTER — Ambulatory Visit (HOSPITAL_COMMUNITY)
Admission: RE | Admit: 2019-11-17 | Discharge: 2019-11-17 | Disposition: A | Discharge status: Home / Self Care | Payer: Managed Care, Other (non HMO) | Source: Ambulatory Visit | Attending: Physician Assistant | Admitting: Physician Assistant

## 2019-11-17 ENCOUNTER — Encounter (HOSPITAL_COMMUNITY): Payer: Self-pay

## 2019-11-17 ENCOUNTER — Other Ambulatory Visit: Payer: Self-pay

## 2019-11-17 ENCOUNTER — Ambulatory Visit
Admission: RE | Admit: 2019-11-17 | Discharge: 2019-11-17 | Disposition: A | Discharge status: Home / Self Care | Payer: Managed Care, Other (non HMO) | Source: Ambulatory Visit | Attending: Radiation Oncology | Admitting: Radiation Oncology

## 2019-11-17 DIAGNOSIS — C3490 Malignant neoplasm of unspecified part of unspecified bronchus or lung: Secondary | ICD-10-CM

## 2019-11-17 DIAGNOSIS — Z510 Encounter for antineoplastic radiation therapy: Secondary | ICD-10-CM | POA: Diagnosis not present

## 2019-11-17 MED ORDER — GADOBUTROL 1 MMOL/ML IV SOLN: 6.0000 mL | Freq: Once | INTRAVENOUS | Status: DC | PRN

## 2019-11-17 NOTE — Progress Notes (Signed)
Patient arrived for her Brain MRI. Patient is very claustrophobic, even after taking Anti Anxiety medication. Patient was very short of breath before getting to MRI Suite. Patient was unable to catch her breath enough to lay flat. Patient will need to be scheduled at [facility] under [place].  She will call Cassandra Heilingoetter Monday Morning. 7:58p

## 2019-11-17 NOTE — Progress Notes (Signed)
Pt here for patient teaching.    Pt given Radiation and You booklet and skin care instructions.    Reviewed areas of pertinence such as fatigue, hair loss, mouth changes, skin changes, throat changes, cough and shortness of breath .   Pt able to give teach back of to pat skin, use unscented/gentle soap and drink plenty of water,avoid applying anything to skin within 4 hours of treatment.   Pt demonstrated understanding and verbalizes understanding of information given and will contact nursing with any questions or concerns.    Http://rtanswers.org/treatmentinformation/whattoexpect/index

## 2019-11-20 ENCOUNTER — Encounter (HOSPITAL_COMMUNITY)
Admission: RE | Admit: 2019-11-20 | Discharge: 2019-11-20 | Disposition: A | Discharge status: Home / Self Care | Payer: Managed Care, Other (non HMO) | Source: Ambulatory Visit | Attending: Physician Assistant | Admitting: Physician Assistant

## 2019-11-20 ENCOUNTER — Ambulatory Visit
Admission: RE | Admit: 2019-11-20 | Discharge: 2019-11-20 | Disposition: A | Discharge status: Home / Self Care | Payer: Managed Care, Other (non HMO) | Source: Ambulatory Visit | Attending: Radiation Oncology | Admitting: Radiation Oncology

## 2019-11-20 ENCOUNTER — Other Ambulatory Visit: Payer: Self-pay

## 2019-11-20 ENCOUNTER — Other Ambulatory Visit: Payer: Self-pay | Admitting: Radiation Oncology

## 2019-11-20 ENCOUNTER — Encounter (HOSPITAL_COMMUNITY): Payer: Self-pay | Admitting: Internal Medicine

## 2019-11-20 DIAGNOSIS — C3490 Malignant neoplasm of unspecified part of unspecified bronchus or lung: Secondary | ICD-10-CM | POA: Insufficient documentation

## 2019-11-20 DIAGNOSIS — C3491 Malignant neoplasm of unspecified part of right bronchus or lung: Secondary | ICD-10-CM

## 2019-11-20 LAB — CBC WITH DIFFERENTIAL (CANCER CENTER ONLY)
Abs Immature Granulocytes: 0.01 10*3/uL (ref 0.00–0.07)
Basophils Absolute: 0 10*3/uL (ref 0.0–0.1)
Basophils Relative: 1 %
Eosinophils Absolute: 0.1 10*3/uL (ref 0.0–0.5)
Eosinophils Relative: 1 %
HCT: 41.6 % (ref 36.0–46.0)
Hemoglobin: 13.4 g/dL (ref 12.0–15.0)
Immature Granulocytes: 0 %
Lymphocytes Relative: 9 %
Lymphs Abs: 0.6 10*3/uL — ABNORMAL LOW (ref 0.7–4.0)
MCH: 28.7 pg (ref 26.0–34.0)
MCHC: 32.2 g/dL (ref 30.0–36.0)
MCV: 89.1 fL (ref 80.0–100.0)
Monocytes Absolute: 0.3 10*3/uL (ref 0.1–1.0)
Monocytes Relative: 4 %
Neutro Abs: 5.5 10*3/uL (ref 1.7–7.7)
Neutrophils Relative %: 85 %
Platelet Count: 169 10*3/uL (ref 150–400)
RBC: 4.67 MIL/uL (ref 3.87–5.11)
RDW: 12.8 % (ref 11.5–15.5)
WBC Count: 6.5 10*3/uL (ref 4.0–10.5)
nRBC: 0 % (ref 0.0–0.2)

## 2019-11-20 LAB — CMP (CANCER CENTER ONLY)
ALT: 7 U/L (ref 0–44)
AST: 15 U/L (ref 15–41)
Albumin: 3.6 g/dL (ref 3.5–5.0)
Alkaline Phosphatase: 57 U/L (ref 38–126)
Anion gap: 10 (ref 5–15)
BUN: 5 mg/dL — ABNORMAL LOW (ref 8–23)
CO2: 25 mmol/L (ref 22–32)
Calcium: 10 mg/dL (ref 8.9–10.3)
Chloride: 102 mmol/L (ref 98–111)
Creatinine: 0.71 mg/dL (ref 0.44–1.00)
GFR, Est AFR Am: >60 mL/min (ref >60)
GFR, Estimated: >60 mL/min (ref >60)
Glucose, Bld: 100 mg/dL — ABNORMAL HIGH (ref 70–99)
Potassium: 3.9 mmol/L (ref 3.5–5.1)
Sodium: 137 mmol/L (ref 135–145)
Total Bilirubin: 0.3 mg/dL (ref 0.3–1.2)
Total Protein: 7.4 g/dL (ref 6.5–8.1)

## 2019-11-20 LAB — GLUCOSE, CAPILLARY
Glucose-Capillary: 110 mg/dL — ABNORMAL HIGH (ref 70–99)
Glucose-Capillary: 110 mg/dL — ABNORMAL HIGH (ref 70–99)

## 2019-11-20 MED ORDER — ONDANSETRON HCL 4 MG PO TABS: 4.0000 mg | ORAL_TABLET | Freq: Three times a day (TID) | ORAL | 0 refills | Status: DC | PRN

## 2019-11-20 MED ORDER — FLUDEOXYGLUCOSE F - 18 (FDG) INJECTION
7.1700 | Freq: Once | INTRAVENOUS | Status: AC
  Administered 2019-11-20: 7.17 via INTRAVENOUS

## 2019-11-21 ENCOUNTER — Other Ambulatory Visit: Payer: Self-pay | Admitting: *Deleted

## 2019-11-21 ENCOUNTER — Telehealth: Payer: Self-pay | Admitting: *Deleted

## 2019-11-21 ENCOUNTER — Ambulatory Visit
Admission: RE | Admit: 2019-11-21 | Discharge: 2019-11-21 | Disposition: A | Discharge status: Home / Self Care | Payer: Managed Care, Other (non HMO) | Source: Ambulatory Visit | Attending: Radiation Oncology | Admitting: Radiation Oncology

## 2019-11-21 ENCOUNTER — Other Ambulatory Visit: Payer: Self-pay | Admitting: Physician Assistant

## 2019-11-21 ENCOUNTER — Other Ambulatory Visit: Payer: Self-pay

## 2019-11-21 DIAGNOSIS — Z510 Encounter for antineoplastic radiation therapy: Secondary | ICD-10-CM | POA: Diagnosis not present

## 2019-11-21 DIAGNOSIS — C3491 Malignant neoplasm of unspecified part of right bronchus or lung: Secondary | ICD-10-CM

## 2019-11-21 MED ORDER — PROCHLORPERAZINE MALEATE 10 MG PO TABS: 10.0000 mg | ORAL_TABLET | Freq: Four times a day (QID) | ORAL | 2 refills | Status: DC | PRN

## 2019-11-21 NOTE — Telephone Encounter (Signed)
While on the phone with the patient to discuss MRI appointment, patient had to stop phone call due to excessive vomiting.  She states that she has tried Zofran with no effect.   Routed to Dr. Vilinda Blanks Heilingoetter, [place].    Confirmed pharmacy of choice [facility]

## 2019-11-21 NOTE — Telephone Encounter (Signed)
MRI scheduled with [facility].  Patient was notified via the phone.  All details for COVID screening and day of MRI were given.

## 2019-11-21 NOTE — Telephone Encounter (Signed)
Cassandra ordered Compazine.  Notified patient

## 2019-11-22 ENCOUNTER — Other Ambulatory Visit: Payer: Self-pay

## 2019-11-22 ENCOUNTER — Encounter: Payer: Self-pay | Admitting: Nurse Practitioner

## 2019-11-22 ENCOUNTER — Telehealth: Payer: Self-pay | Admitting: Emergency Medicine

## 2019-11-22 ENCOUNTER — Ambulatory Visit
Admission: RE | Admit: 2019-11-22 | Discharge: 2019-11-22 | Disposition: A | Discharge status: Home / Self Care | Payer: Managed Care, Other (non HMO) | Source: Ambulatory Visit | Attending: Radiation Oncology | Admitting: Radiation Oncology

## 2019-11-22 DIAGNOSIS — Z510 Encounter for antineoplastic radiation therapy: Secondary | ICD-10-CM | POA: Diagnosis not present

## 2019-11-22 NOTE — Telephone Encounter (Signed)
Patient calling to ask for oxygen. She has an appointment on 11/30/19 with Dr. Lamonte Sakai and will discuss a qualifying walk at that time. She is going to buy an oxygen saturation meter at the drug store and call our office if she is less than 88% on room air.

## 2019-11-23 ENCOUNTER — Other Ambulatory Visit: Payer: Self-pay

## 2019-11-23 ENCOUNTER — Encounter (HOSPITAL_COMMUNITY): Payer: Self-pay | Admitting: Internal Medicine

## 2019-11-23 ENCOUNTER — Encounter: Payer: Self-pay | Admitting: *Deleted

## 2019-11-23 ENCOUNTER — Ambulatory Visit
Admission: RE | Admit: 2019-11-23 | Discharge: 2019-11-23 | Disposition: A | Discharge status: Home / Self Care | Payer: Managed Care, Other (non HMO) | Source: Ambulatory Visit | Attending: Radiation Oncology | Admitting: Radiation Oncology

## 2019-11-23 DIAGNOSIS — Z510 Encounter for antineoplastic radiation therapy: Secondary | ICD-10-CM | POA: Diagnosis not present

## 2019-11-23 NOTE — Progress Notes (Signed)
I updated Dr. Julien Nordmann on PDL 1 result of 95%.

## 2019-11-24 ENCOUNTER — Ambulatory Visit
Admission: RE | Admit: 2019-11-24 | Discharge: 2019-11-24 | Disposition: A | Discharge status: Home / Self Care | Payer: Managed Care, Other (non HMO) | Source: Ambulatory Visit | Attending: Radiation Oncology | Admitting: Radiation Oncology

## 2019-11-24 ENCOUNTER — Other Ambulatory Visit: Payer: Self-pay

## 2019-11-24 DIAGNOSIS — Z510 Encounter for antineoplastic radiation therapy: Secondary | ICD-10-CM | POA: Diagnosis not present

## 2019-11-27 ENCOUNTER — Other Ambulatory Visit: Payer: Self-pay

## 2019-11-27 ENCOUNTER — Ambulatory Visit
Admission: RE | Admit: 2019-11-27 | Discharge: 2019-11-27 | Disposition: A | Discharge status: Home / Self Care | Payer: Managed Care, Other (non HMO) | Source: Ambulatory Visit | Attending: Radiation Oncology | Admitting: Radiation Oncology

## 2019-11-27 ENCOUNTER — Telehealth: Payer: Self-pay | Admitting: Emergency Medicine

## 2019-11-27 DIAGNOSIS — C3490 Malignant neoplasm of unspecified part of unspecified bronchus or lung: Secondary | ICD-10-CM

## 2019-11-27 DIAGNOSIS — Z510 Encounter for antineoplastic radiation therapy: Secondary | ICD-10-CM | POA: Diagnosis not present

## 2019-11-27 DIAGNOSIS — R0602 Shortness of breath: Secondary | ICD-10-CM

## 2019-11-27 NOTE — Telephone Encounter (Signed)
Patient called back and I advised her that Dr. Lamonte Sakai wants her to come in and get a chest x-ray and asked if she could come in today before 5 or first thing in the morning. Patient states that she can come in the morning to get it done. Order has been placed. Dr. Lamonte Sakai patient has an appointment scheduled with you on 11/30/19 already and will keep that appointment. Nothing further needed at this time.

## 2019-11-27 NOTE — Telephone Encounter (Signed)
ATC patient to see if she can come in for a CXR LMTCB

## 2019-11-27 NOTE — Telephone Encounter (Signed)
She needs a CXR now for SOB - may have an evolving pleural effusion.  Set her up with OV with APP this week or alternatively we can call her with CXR results.

## 2019-11-27 NOTE — Telephone Encounter (Signed)
Primary Pulmonologist: Dr. Lamonte Sakai Last office visit and with whom: Dr. Lamonte Sakai 10/16/2019 What do we see them for (pulmonary problems): Left cervical lymphadenopathy Last OV assessment/plan: See below  Was appointment offered to patient (explain)?  No   Reason for call: Patient states that she is having shortness of breath with exertion that has been going on for 3-4 weeks and feels like its getting worse. Does not have enough oxygen when laying down. Says she drops down to 88% when she lays down and that's why she is having trouble doing her radiation treatments because she has to lay flat. Does not wear oxygen, taking mucinex twice a day. Cough with clear sputum. Denies fever  Dr. Lamonte Sakai please advise  (examples of things to ask: : When did symptoms start? Fever? Cough? Productive? Color to sputum? More sputum than usual? Wheezing? Have you needed increased oxygen? Are you taking your respiratory medications? What over the counter measures have you tried?)  Allergies  Allergen Reactions  . Pseudoephedrine Hypertension  . Gabapentin Other (See Comments)    Raise blood pressure and red rings around eyes Blood vessels popped in her eyes  . Mobic [Meloxicam] Swelling    Inflamed the area that has inflammation and stabbing pains in the area  . Penicillins Hives    Immunization History  Administered Date(s) Administered  . Td 10/06/1994    Assessment & Plan:  Lymphadenopathy of left cervical region Left cervical lymphadenopathy, firm on palpation.  She has had increased dyspnea since it began to evolve.  She is also noticed a right axillary node and imaging has confirmed mediastinal lymphadenopathy.  All concerning for malignancy.  I will review her CT neck with interventional radiology to determine whether needle biopsy of this lesion would give the quickest, best information.  She needs a CT scan of the chest to better characterize her mediastinum and also look for other parenchymal  abnormalities.  It may be possible to visualize the right axillary node on chest CT.  If so an axillary nodal resection would be a potential target as well.  If TTNA is not feasible then I can arrange for bronchoscopy with endobronchial ultrasound and mediastinal nodal biopsies.  Baltazar Apo, MD, PhD 10/16/2019, 3:48 PM [facility] [phone] or if no answer [phone]

## 2019-11-28 ENCOUNTER — Other Ambulatory Visit: Payer: Self-pay | Admitting: Radiation Oncology

## 2019-11-28 ENCOUNTER — Other Ambulatory Visit: Payer: Self-pay

## 2019-11-28 ENCOUNTER — Encounter: Payer: Self-pay | Admitting: Gastroenterology

## 2019-11-28 ENCOUNTER — Ambulatory Visit (INDEPENDENT_AMBULATORY_CARE_PROVIDER_SITE_OTHER): Payer: Managed Care, Other (non HMO)

## 2019-11-28 ENCOUNTER — Ambulatory Visit
Admission: RE | Admit: 2019-11-28 | Discharge: 2019-11-28 | Disposition: A | Discharge status: Home / Self Care | Payer: Managed Care, Other (non HMO) | Source: Ambulatory Visit | Attending: Radiation Oncology | Admitting: Radiation Oncology

## 2019-11-28 DIAGNOSIS — C3490 Malignant neoplasm of unspecified part of unspecified bronchus or lung: Secondary | ICD-10-CM

## 2019-11-28 DIAGNOSIS — R0602 Shortness of breath: Secondary | ICD-10-CM | POA: Diagnosis not present

## 2019-11-28 DIAGNOSIS — Z510 Encounter for antineoplastic radiation therapy: Secondary | ICD-10-CM | POA: Diagnosis not present

## 2019-11-28 MED ORDER — HYDROCODONE-ACETAMINOPHEN 7.5-325 MG/15ML PO SOLN: 10.0000 mL | Freq: Four times a day (QID) | ORAL | 0 refills | Status: DC | PRN

## 2019-11-28 NOTE — Progress Notes (Signed)
[place] OFFICE PROGRESS NOTE  Minette Brine, [address] [id number]  DIAGNOSIS: Stage IV non-small cell lung cancer, most consistent with adenocarcinoma. The patient presented with a right perihilar lung mass which encases and occludes the right lower lobe pulmonary artery.  There is also associated case meant of the right upper lobe, right middle lobe, and right lower lobe bronchi.  The polyp patient also has associated bilateral mediastinal, right axillary, cervical, and supraclavicular lymphadenopathy. The patient was diagnosed in July 2021  PDL1 Expression:  95%  Biomarker Findings Tumor Mutational Burden - 16 Muts/Mb Microsatellite status - MS-Stable Genomic Findings For a complete list of the genes assayed, please refer to the Appendix. KRAS G12V PTEN loss exon 2 KDM6A deletion exons 6-21 TERT promoter -124C>T TP53 V274F 7 Disease relevant genes with no reportable alterations: ALK, BRAF, EGFR, ERBB2, MET, RET, ROS1  Tumor Proportion Score (TPS) ( 95%)  PRIOR THERAPY: None  CURRENT THERAPY: 1) palliative radiotherapy to the lung mass which is causing encasement of the right upper lobe, right middle lobe, and right lower lobe as well as palliative radiotherapy to the painful cervical adenopathy under the care of Dr. Sondra Come.  Last dose of radiation expected on 12/04/2019  2) single agent immunotherapy with Cemiplimab IV every 3 weeks.  First dose expected on 12/14/2019.  INTERVAL HISTORY: Elizabeth Mayer 59 y.o. female returns to clinic today for a follow-up visit.  The patient was recently diagnosed with non-small cell lung cancer, adenocarcinoma She has extensive disease encasing the right upper lobe, right middle lobe, and right lower lobe.  She also has bulky adenopathy in the cervical region which is causing discomfort.  She is currently undergoing palliative radiotherapy to this region. She is expected to complete her treatment on  12/04/19. She notes improvement in her bulky adenopathy since starting radiation.  The patient has been experiencing worsening dyspnea, particularly while lying flat.  The patient had a chest x-ray performed by pulmonology yesterday which did not show any new findings. She has an appointment with them tomorrow for further evaluation. The patient denies any fever, chills, or night sweats. She has lost 7 lbs since her last appointment. She has some soreness with swallowing secondary to her radiation treatment. She was given a prescription of liquid hydrocodone which has been helping. She reports some nausea for which she takes compazine.  She denies any vomiting or diarrhea. She reports constipation for which she has been trying to eat more veggies. She continues to report cough and shortness of breath.  She endorses a discomfort in her neck that feels like a "pulling sensation" due to the adenopathy in her neck, which is somewhat improved. She does report that she has more range of motion in her neck compared to prior.  She still has ptosis of the left eye.  She was supposed to have a staging brain MRI on 11/17/2019 but she was unable to complete this due to claustrophobia despite having Ativan.  She is rescheduled for a MRI of the brain under sedation on 12/05/2019.  The patient recently had molecular studies performed and her staging PET scan.  She is here today for evaluation and more detailed discussion about her current condition and recommended treatment options.  MEDICAL HISTORY: Past Medical History:  Diagnosis Date  . Angina 1982   related to stress  . Asthma    in the past, not now- never inhlaer use  . Heart murmur 1970s  .  Ingrown toenail   . Past heart attack 1980-1981   pt states she passed out and woke up in hospital- told she had heart attack, but then dr said he couldn't find anything wrong.    ALLERGIES:  is allergic to pseudoephedrine, gabapentin, mobic [meloxicam], and  penicillins.  MEDICATIONS:  Current Outpatient Medications  Medication Sig Dispense Refill  . acetaminophen (TYLENOL) 500 MG tablet Take 1,000 mg by mouth every 6 (six) hours as needed for moderate pain.    . Carboxymethylcellul-Glycerin (LUBRICATING EYE DROPS OP) Place 1 drop into both eyes daily as needed (irritation).    . cyclobenzaprine (FLEXERIL) 10 MG tablet Take 10 mg by mouth 3 (three) times daily as needed for muscle spasms.    . fluticasone (FLONASE) 50 MCG/ACT nasal spray Place 2 sprays into both nostrils daily.    Marland Kitchen guaiFENesin (MUCINEX) 600 MG 12 hr tablet Take 600 mg by mouth 2 (two) times daily.    Marland Kitchen HYDROcodone-acetaminophen (HYCET) 7.5-325 mg/15 ml solution Take 10 mLs by mouth 4 (four) times daily as needed for moderate pain. 240 mL 0  . HYDROcodone-acetaminophen (NORCO/VICODIN) 5-325 MG tablet Take 1-2 tablets by mouth every 6 (six) hours as needed for moderate pain. 60 tablet 0  . ondansetron (ZOFRAN) 4 MG tablet Take 1 tablet (4 mg total) by mouth every 8 (eight) hours as needed for nausea or vomiting. 20 tablet 0  . orphenadrine (NORFLEX) 100 MG tablet Take 1 tablet (100 mg total) by mouth 2 (two) times daily as needed for muscle spasms. 60 tablet 1  . prochlorperazine (COMPAZINE) 10 MG tablet Take 1 tablet (10 mg total) by mouth every 6 (six) hours as needed. 30 tablet 2   No current facility-administered medications for this visit.    SURGICAL HISTORY:  Past Surgical History:  Procedure Laterality Date  . NECK SURGERY  2000   Disc removed from neck     REVIEW OF SYSTEMS:   Review of Systems  Constitutional: Positive for fatigue and weight loss. Negative for appetite change, chills, and fever. HENT: Negative for mouth sores, nosebleeds, sore throat and trouble swallowing.   Eyes: Positive for ptosis on left eyelid. Negative for diplopia, photophobia, visual field defects, or icterus.  Respiratory: Positive for cough and shortness of breath.  Negative for  wheezing.   Cardiovascular: Negative for chest pain and leg swelling.  Gastrointestinal: Positive for mild baseline constipation. Negative for abdominal pain, diarrhea, nausea and vomiting.  Genitourinary: Negative for bladder incontinence, difficulty urinating, dysuria, frequency and hematuria.   Musculoskeletal: Positive for neck pain and stiffness. Negative for back pain gait problem. Skin: Negative for itching and rash.  Neurological: Negative for dizziness, extremity weakness, gait problem, headaches, light-headedness and seizures.  Hematological: Positive for bilateral cervical/supraclavicular adenopathy (Mayer>R). Positive for right axillary adenopathy. Does not bruise/bleed easily.  Psychiatric/Behavioral: Negative for confusion, depression and sleep disturbance. The patient is not nervous/anxious.     PHYSICAL EXAMINATION:  There were no vitals taken for this visit.  ECOG PERFORMANCE STATUS: 1  Physical Exam  Constitutional: Oriented to person, place, and time and well-developed, well-nourished, and in no distress.  HENT:  Head: Normocephalic and atraumatic.  Mouth/Throat: Oropharynx is clear and moist. No oropharyngeal exudate.  Eyes: Ptosis noted on left eyelid. Conjunctivae are normal. Right eye exhibits no discharge. Left eye exhibits no discharge. No scleral icterus.  Neck: Bilateral cervical/supraclavicular adenopathy (Mayer>R). Range of motion limited by large neck mass. Neck supple.  Cardiovascular: Normal rate, regular rhythm, normal heart sounds  and intact distal pulses.   Pulmonary/Chest: Effort normal. Decreased breath sounds in right lung. No respiratory distress. No wheezes. No rales.  Abdominal: Soft. Bowel sounds are normal. Exhibits no distension and no mass. There is no tenderness.  Musculoskeletal: Normal range of motion. Exhibits no edema.  Lymphadenopathy:    Right axillary, bilateral cervical and supraclavicular adenopathy..  Neurological: Alert and oriented to  person, place, and time. Exhibits normal muscle tone. Gait normal. Coordination normal.  Skin: Skin is warm and dry. No rash noted. Not diaphoretic. No erythema. No pallor.  Psychiatric: Mood, memory and judgment normal.  Vitals reviewed.  LABORATORY DATA: Lab Results  Component Value Date   WBC 6.5 11/20/2019   HGB 13.4 11/20/2019   HCT 41.6 11/20/2019   MCV 89.1 11/20/2019   PLT 169 11/20/2019      Chemistry      Component Value Date/Time   NA 137 11/20/2019 1117   NA 139 07/31/2019 1518   K 3.9 11/20/2019 1117   CL 102 11/20/2019 1117   CO2 25 11/20/2019 1117   BUN 5 (Mayer) 11/20/2019 1117   BUN 8 07/31/2019 1518   CREATININE 0.71 11/20/2019 1117      Component Value Date/Time   CALCIUM 10.0 11/20/2019 1117   ALKPHOS 57 11/20/2019 1117   AST 15 11/20/2019 1117   ALT 7 11/20/2019 1117   BILITOT 0.3 11/20/2019 1117       RADIOGRAPHIC STUDIES:  DG Chest 2 View  Result Date: 11/28/2019 CLINICAL DATA:  Shortness of breath. Adenoma squamous carcinoma of the lung. EXAM: CHEST - 2 VIEW COMPARISON:  CT chest 10/17/2019 FINDINGS: Heart size appears within normal limits. There is complete atelectasis of the right middle lobe secondary to previously identified right perihilar lung mass. Subsegmental atelectasis involving the left lower lobe is similar to previous chest CT. No superimposed airspace consolidation identified. IMPRESSION: 1. Unchanged appearance of complete right middle lobe atelectasis and subsegmental atelectasis of the left lower lobe. No acute superimposed cardiopulmonary abnormalities noted. Electronically Signed   By: Kerby Moors M.D.   On: 11/28/2019 10:07   NM PET Image Initial (PI) Skull Base To Thigh  Result Date: 11/20/2019 CLINICAL DATA:  Initial treatment strategy for non-small cell lung cancer. EXAM: NUCLEAR MEDICINE PET SKULL BASE TO THIGH TECHNIQUE: 7.2 mCi F-18 FDG was injected intravenously. Full-ring PET imaging was performed from the skull base to  thigh after the radiotracer. CT data was obtained and used for attenuation correction and anatomic localization. Fasting blood glucose: 110 mg/dl COMPARISON:  Multiple exams, including CT chest from 10/17/2019 FINDINGS: Mediastinal blood pool activity: SUV max 2.1 Liver activity: SUV max N/A NECK: Hypermetabolic left level III, bilateral level V, and bilateral level IV adenopathy in the lower neck. Dominant left level III lymph node measures 3.6 cm in short axis has central necrosis and a maximum SUV of 21.9. Incidental CT findings: none CHEST: Bilateral supraclavicular, left subpectoral, and right axillary adenopathy noted with infiltrative conglomerate prevascular, paratracheal, and AP window adenopathy along with individually hypermetabolic prevascular, right hilar, left hilar, subcarinal, and distal paraesophageal lymph nodes. Index right axillary node 2.4 cm in short axis with maximum SUV 14.1. Index conglomerate prevascular and paratracheal adenopathy maximum SUV 19.3. Right hilar mass more closely associated with the right lower lobe and right middle lobe, maximum SUV 17.3, measuring 3.9 cm in long axis. Tumor along the right pleural-pericardial margin medially measuring up to 1.5 cm in thickness on image 68/4, maximum SUV 17.1 Incidental CT findings:  Centrilobular emphysema. Right-sided airway thickening with some scattered volume loss in the right upper lobe, right middle lobe, and superior segment right lower lobe. Trace left pleural effusion, nonspecific for transudative or exudative etiology. ABDOMEN/PELVIS: The left adrenal nodule measuring about 0.8 by 1.2 cm on the prior CT chest has an internal density of 16 Hounsfield units (indeterminate) but a maximum SUV of only about 3.5 which is indeterminate. There is faintly accentuated activity in a 0.5 cm right gastric lymph node with maximum SUV of 3.4. Incidental CT findings: Aortoiliac atherosclerotic vascular disease. SKELETON: No significant abnormal  hypermetabolic activity in this region. Incidental CT findings: Degenerative disc disease and degenerative endplate sclerosis at K9-T2. IMPRESSION: 1. Right hilar mass with extensive lower neck and thoracic adenopathy. The lower neck nodal involvement extends beyond scalene/supraclavicular nodes accordingly constitutes extra thoracic metastatic disease. Appearance compatible with T3 N3 M1 disease (stage IV). 2. The left adrenal nodule is not overtly hypermetabolic but only measures 0.8 by 1.2 cm. This nodule was not present on the CT abdomen from 10/06/2017 and has a density of 16 Hounsfield units precontrast which is indeterminate. Surveillance recommended. 3.  Aortic Atherosclerosis (ICD10-I70.0). 4. Trace left pleural effusion. Electronically Signed   By: Van Clines M.D.   On: 11/20/2019 16:47   [place] CORE BIOPSY (SOFT TISSUE)  Result Date: 11/01/2019 INDICATION: Left cervical nodal mass, lung cancer suspected by imaging EXAM: ULTRASOUND LEFT CERVICAL NODAL MASS 18 GAUGE CORE BIOPSY MEDICATIONS: 1% LIDOCAINE LOCAL ANESTHESIA/SEDATION: Moderate (conscious) sedation was employed during this procedure. A total of Versed 1.0 mg and Fentanyl 50 mcg was administered intravenously. Moderate Sedation Time: 10 minutes. The patient's level of consciousness and vital signs were monitored continuously by radiology nursing throughout the procedure under my direct supervision. FLUOROSCOPY TIME:  Fluoroscopy Time: NONE. COMPLICATIONS: None immediate. PROCEDURE: Informed written consent was obtained from the patient after a thorough discussion of the procedural risks, benefits and alternatives. All questions were addressed. Maximal Sterile Barrier Technique was utilized including caps, mask, sterile gowns, sterile gloves, sterile drape, hand hygiene and skin antiseptic. A timeout was performed prior to the initiation of the procedure. Previous imaging reviewed. Preliminary ultrasound performed. The left cervical nodal  mass was localized and marked. Under sterile conditions and local anesthesia, an 18 gauge biopsy was advanced to the left cervical nodal mass. 5 18 gauge core biopsies obtained. Samples were intact and non fragmented. These were placed in saline. Postprocedure imaging demonstrates no hemorrhage or hematoma. Patient tolerated the biopsy well. IMPRESSION: Successful ultrasound left cervical nodal mass 18 gauge core biopsies Electronically Signed   By: Jerilynn Mages.  Shick M.D.   On: 11/01/2019 14:06     ASSESSMENT/PLAN:  This is a very pleasant 59 year old African American female diagnosed with metastatic non-small cell lung cancer, most consistent with adenocarcinoma. The patient presented with a right perihilar lung mass which encases and occludes the right lower lobe pulmonary artery.  There is also associated encasement of the right upper lobe, right middle lobe, and right lower lobe bronchi.  The patient also has associated bilateral mediastinal, right axillary, cervical, and supraclavicular lymphadenopathy. The patient was diagnosed in July 2021.  Her PD-L1 expression is 95%.  Her foundation 1 molecular studies show no actionable mutations  The patient is currently undergoing palliative radiotherapy to the bulky cervical lymphadenopathy and the obstructive mass which is encasing the right upper lobe, right middle lobe, and right lower lobe.  She is receiving radiation under the care of Dr. Sondra Come.  She is expected  to receive her last dose of radiotherapy on 12/04/2019.  The patient was seen with Dr. Julien Nordmann today.  Dr. Julien Nordmann had a lengthy discussion with the patient about her current condition and recommend treatment options.  The patient expressed that she is not interested in receiving chemotherapy. Dr. Julien Nordmann recommended single agent immunotherapy with Cemilimab IV every 3 weeks.  The patient is interested in this option and she is expected to receive her first dose on 12/14/19  We discussed with her the  adverse effect of the immunotherapy including but not limited to immunotherapy mediated skin rash, diarrhea, inflammation of the lung, kidney, liver, thyroid or other endocrine dysfunction  We will see he back for a follow up visit on her first day of treatment on 12/14/19.   She was instructed to keep her brain MRI on 12/05/19 as planned.   She will follow up with Dr. Lamonte Sakai tomorrow as scheduled.   Constipation education was given to the patient.   The patient was advised to call immediately if she has any concerning symptoms in the interval. The patient voices understanding of current disease status and treatment options and is in agreement with the current care plan. All questions were answered. The patient knows to call the clinic with any problems, questions or concerns. We can certainly see the patient much sooner if necessary     Elizabeth Mayer Karmine Kauer, PA-C 11/28/19  ADDENDUM: Hematology/Oncology Attending: I had a face-to-face encounter with the patient today.  I recommended her care plan.  This is a very pleasant 59 years old African-American female recently diagnosed with a stage IV non-small cell lung cancer, adenocarcinoma with no actionable mutation but PD-L1 expression of 95% diagnosed in July 2021 and presented with right perihilar lung mass with encasement and occlusion of the right lower lobe pulmonary artery as well as encasement of the right upper lobe, right middle lobe and right lower lobe bronchi with associated bilateral mediastinal and supraclavicular and axillary lymphadenopathy.  The patient is currently undergoing a course of palliative radiotherapy to the lymphadenopathy in the mediastinum and neck area.  She is expected to complete this course next week. I had a lengthy discussion with the patient today about her current disease stage, prognosis and treatment options. The patient understands that she has incurable condition and all the treatment options will be of  palliative nature. She was giving the option of palliative care versus palliative treatment with systemic chemotherapy in combination with immunotherapy versus treatment with immunotherapy alone.  The patient is not interested in receiving chemotherapy unless absolutely necessary.  She preferred to proceed with immunotherapy as single agent at this point. I recommended for the patient treatment with single agent Libtayo (Cempilimab) 350 mg IV every 3 weeks.  I discussed with the patient the adverse effect of this treatment including but not limited to immunotherapy mediated skin rash, diarrhea, inflammation of the lung, kidney, liver, thyroid or other endocrine dysfunction. She is expected to start the first dose of this treatment on 12/14/2019. The patient will come back for follow-up visit with the start of the first cycle of her treatment. She will have MRI of the brain performed next week. She was advised to call immediately if she has any concerning symptoms in the interval.

## 2019-11-29 ENCOUNTER — Encounter: Payer: Self-pay | Admitting: Physician Assistant

## 2019-11-29 ENCOUNTER — Ambulatory Visit
Admission: RE | Admit: 2019-11-29 | Discharge: 2019-11-29 | Disposition: A | Discharge status: Home / Self Care | Payer: Managed Care, Other (non HMO) | Source: Ambulatory Visit | Attending: Radiation Oncology | Admitting: Radiation Oncology

## 2019-11-29 ENCOUNTER — Other Ambulatory Visit: Payer: Self-pay | Admitting: Internal Medicine

## 2019-11-29 ENCOUNTER — Inpatient Hospital Stay (HOSPITAL_BASED_OUTPATIENT_CLINIC_OR_DEPARTMENT_OTHER): Payer: Managed Care, Other (non HMO) | Admitting: Physician Assistant

## 2019-11-29 VITALS — BP 120/79 | HR 97 | Temp 98.0°F | Resp 18 | Ht 65.6 in | Wt 137.0 lb

## 2019-11-29 DIAGNOSIS — Z510 Encounter for antineoplastic radiation therapy: Secondary | ICD-10-CM | POA: Diagnosis not present

## 2019-11-29 DIAGNOSIS — Z7189 Other specified counseling: Secondary | ICD-10-CM | POA: Diagnosis not present

## 2019-11-29 DIAGNOSIS — C3491 Malignant neoplasm of unspecified part of right bronchus or lung: Secondary | ICD-10-CM | POA: Diagnosis not present

## 2019-11-29 DIAGNOSIS — Z5112 Encounter for antineoplastic immunotherapy: Secondary | ICD-10-CM | POA: Diagnosis not present

## 2019-11-29 MED ORDER — SODIUM CHLORIDE 0.9 % IV SOLN: 350.0000 mg | INTRAVENOUS | Status: DC

## 2019-11-29 NOTE — Progress Notes (Signed)
CLINICAL TRIAL SELECTED - Non-Small Cell Lung  Other Clinical Trial: cempilimab  **Trial eligibility and accrual should be confirmed by your research team**  Patient Characteristics: Stage IV Metastatic, Nonsquamous, Initial Chemotherapy/Immunotherapy, PS = 0, 1, ALK Rearrangement Negative and ROS1 Rearrangement Negative and NTRK Gene Fusion?Negative and RET Gene Fusion?Negative and EGFR Mutation Negative, PD-L1 Expression Positive   ? 50% (TPS) and Immunotherapy Candidate Therapeutic Status: Stage IV Metastatic Histology: Nonsquamous Cell ROS1 Rearrangement Status: Negative Other Mutations/Biomarkers: No Other Actionable Mutations Chemotherapy/Immunotherapy LOT: Initial Chemotherapy/Immunotherapy Molecular Targeted Therapy: Not Appropriate KRAS G12C Mutation Status: Negative MET Exon 14 Mutation Status: Negative RET Gene Fusion Status: Negative EGFR Mutation Status: Negative/Wild Type NTRK Gene Fusion Status: Negative PD-L1 Expression Status: PD-L1 Positive ? 50% (TPS) ALK Rearrangement Status: Negative BRAF V600E Mutation Status: Negative ECOG Performance Status: 1 Biomarker Assessment Status Confirmation: All Genomic Markers Negative, or Only MET+ or BRAF+ or KRAS G12C+ Immunotherapy Candidate Status: Candidate for Immunotherapy Intent of Therapy: Non-Curative / Palliative Intent, Discussed with Patient

## 2019-11-29 NOTE — Patient Instructions (Addendum)
Summary:  -There are two main categories of lung cancer, they are named based on the size of the cancer cell. One is called Non-Small cell lung cancer. The other type is Small Cell Lung Cancer -The sample (biopsy) that they took of your tumor was consistent with a subtype of Non-small cell lung cancer called Adenocarcinoma. This is the most common type -You have a good marker for response to immunotherapy! -We covered a lot of important information at your appointment today regarding what the treatment plan is moving forward. Here are the the main points that were discussed at your office visit with [place] today:  -The treatment that you will receive consists of one immunotherapy drug called Cemiplimab. This treatment is IV every 3 weeks up to two years as long as you are tolerating it well and as long as it is working. -We are planning on starting your treatment week on 12/14/19 but before your start your treatment, I would like you to attend a Chemotherapy Education Class. This involves having you sit down with one of our nurse educators. She will discuss with your one-on-one more details about your treatment as well as general information about resources here at the cancer center.  -We will get a CT scan after 3 treatments to check on the progress of treatment  Referrals or Imaging: -Please make sure to go to your brain MRI as scheduled.    Follow up:  -We will see you back for a follow up visit  On 12/14/19 -If you need to reach [place] at any time, the main office number to the cancer center is [phone], when you call, ask to speak to either Cassie's or Dr. Worthy Flank nurse.

## 2019-11-29 NOTE — Progress Notes (Addendum)
ALERT: A disease instance has been permanently removed from this patient's pathway record.  Disease Being Removed: Melanoma and Other Skin Cancers  Reason for Removal: Diagnosis was entered in error

## 2019-11-30 ENCOUNTER — Encounter: Payer: Self-pay | Admitting: Emergency Medicine

## 2019-11-30 ENCOUNTER — Ambulatory Visit
Admission: RE | Admit: 2019-11-30 | Discharge: 2019-11-30 | Disposition: A | Discharge status: Home / Self Care | Payer: Managed Care, Other (non HMO) | Source: Ambulatory Visit | Attending: Radiation Oncology | Admitting: Radiation Oncology

## 2019-11-30 ENCOUNTER — Other Ambulatory Visit: Payer: Self-pay

## 2019-11-30 ENCOUNTER — Ambulatory Visit: Payer: Managed Care, Other (non HMO) | Admitting: Emergency Medicine

## 2019-11-30 DIAGNOSIS — Z510 Encounter for antineoplastic radiation therapy: Secondary | ICD-10-CM | POA: Diagnosis not present

## 2019-11-30 DIAGNOSIS — R0602 Shortness of breath: Secondary | ICD-10-CM

## 2019-11-30 DIAGNOSIS — C3491 Malignant neoplasm of unspecified part of right bronchus or lung: Secondary | ICD-10-CM

## 2019-11-30 DIAGNOSIS — J449 Chronic obstructive pulmonary disease, unspecified: Secondary | ICD-10-CM | POA: Insufficient documentation

## 2019-11-30 DIAGNOSIS — J4489 Other specified chronic obstructive pulmonary disease: Secondary | ICD-10-CM | POA: Insufficient documentation

## 2019-11-30 NOTE — Assessment & Plan Note (Signed)
Undergoing immunotherapy and radiation therapy.

## 2019-11-30 NOTE — Patient Instructions (Addendum)
We will perform a walking oximetry today to see if you qualify for oxygen.  We will try albuterol 2 puff if needed for shortness of breath, mucous clearance.  Follow with medical oncology and radiation oncology as planned Follow with Dr Lamonte Sakai in 3 months or sooner if you have any problems.

## 2019-11-30 NOTE — Progress Notes (Signed)
Subjective:    Patient ID: Elizabeth Mayer, female    DOB: [birth year], 59 y.o.   MRN: [id number]  HPI 59 year old former smoker (20 pack years), carries a history of childhood asthma. Mitral valve prolapse.  Possible CAD? Unclear evaluation, remote 40 yrs ago.  She has been evaluated by Dr. Wilburn Cornelia with ENT for left inferior neck adenopathy with associated dyspnea.  A CT scan of her neck was done 09/29/2019 which I have reviewed, shows multiple bilateral cervical nodes lymph nodes in the inferior aspect of the neck, extensive mediastinal adenopathy with areas of necrotic change. She has has exertional SOB since jan / feb. She noticed an increase in size of a 'Knot' left neck. She has now noticed a lump in her R axilla associated with some burning. No cough.   ROV 11/30/19 --follow-up visit for 59 year old former smoker with a history of childhood asthma.  I saw her in early July for new observation of cervical lymphadenopathy, extensive mediastinal adenopathy with necrotic change.  I sent her for biopsy of her cervical lymphadenopathy which was done on 11/01/2019 and showed metastatic carcinoma most consistent with lung adenocarcinoma.  PD-L1 expression 95%.  She is undergoing palliative radiotherapy to the cervical lesion and the lung mass which is encasing the right upper middle and lower lobe airways and causing postobstructive collapse.  Also on single agent immunotherapy.  She was experiencing progressive dyspnea earlier this week so a chest x-ray was performed 8/24.  I have reviewed, this shows no pleural effusion, stable right middle lobe and left lower lobe atelectasis. She continues to have dyspnea with exertion. She is coughing up clear mucous. She is unable to do her daily housework due to SOB.  She is going to undergo a sedation MRI brain on 12/05/19.    Review of Systems As per HPI      Objective:   Physical Exam  Today's Vitals   11/30/19 1554 11/30/19 1557  BP: 110/64   Pulse: 98    Temp: (!) 97.2 F (36.2 C)   TempSrc: Temporal   SpO2:  96%  Weight: 137 lb 9.6 oz (62.4 kg)   Height: 5' 5" (1.651 m)    Body mass index is 22.9 kg/m.;sm  Gen: Pleasant, well-nourished, in no distress,  normal affect  ENT: No lesions,  mouth clear,  oropharynx clear, no postnasal drip  Neck: No JVD, no stridor, there is a mass in the inferior left neck, supraclavicular region.  Lungs: No use of accessory muscles, no crackles or wheezing on normal respiration, no wheeze on forced expiration  Cardiovascular: RRR, heart sounds normal, no murmur or gallops, no peripheral edema  Musculoskeletal: No deformities, no cyanosis or clubbing  Neuro: alert, awake, non focal  Skin: Warm, no lesions or rash     Assessment & Plan:  Non-small cell lung cancer, right (HCC) Undergoing immunotherapy and radiation therapy.  Dyspnea Question whether she may have occult hypoxemia.  She has noticed some orthodeoxia at home with her own oximeter.  We will perform a walking oximetry today and if she desaturates then arrange for supplemental oxygen.  Unclear whether she has overt obstructive lung disease she does have macro obstruction due to her malignancy.  I think it would be reasonable to try SABA to see if she gets benefit.  She is willing to do so  We will perform a walking oximetry today to see if you qualify for oxygen.  We will try albuterol 2 puff if needed for shortness  of breath, mucous clearance.  Follow with medical oncology and radiation oncology as planned Follow with Dr Byrum in 3 months or sooner if you have any problems.  Robert Byrum, MD, PhD 11/30/2019, 4:11 PM [facility] [phone] or if no answer [phone]  

## 2019-11-30 NOTE — Assessment & Plan Note (Signed)
Question whether she may have occult hypoxemia.  She has noticed some orthodeoxia at home with her own oximeter.  We will perform a walking oximetry today and if she desaturates then arrange for supplemental oxygen.  Unclear whether she has overt obstructive lung disease she does have macro obstruction due to her malignancy.  I think it would be reasonable to try SABA to see if she gets benefit.  She is willing to do so  We will perform a walking oximetry today to see if you qualify for oxygen.  We will try albuterol 2 puff if needed for shortness of breath, mucous clearance.  Follow with medical oncology and radiation oncology as planned Follow with Dr Lamonte Sakai in 3 months or sooner if you have any problems.

## 2019-12-01 ENCOUNTER — Ambulatory Visit
Admission: RE | Admit: 2019-12-01 | Discharge: 2019-12-01 | Disposition: A | Discharge status: Home / Self Care | Payer: Managed Care, Other (non HMO) | Source: Ambulatory Visit | Attending: Radiation Oncology | Admitting: Radiation Oncology

## 2019-12-01 ENCOUNTER — Other Ambulatory Visit: Payer: Self-pay

## 2019-12-01 DIAGNOSIS — Z510 Encounter for antineoplastic radiation therapy: Secondary | ICD-10-CM | POA: Diagnosis not present

## 2019-12-02 ENCOUNTER — Ambulatory Visit (HOSPITAL_COMMUNITY)
Admission: RE | Admit: 2019-12-02 | Discharge: 2019-12-02 | Disposition: A | Discharge status: Home / Self Care | Payer: Managed Care, Other (non HMO) | Source: Ambulatory Visit | Attending: Physician Assistant | Admitting: Physician Assistant

## 2019-12-02 DIAGNOSIS — Z01812 Encounter for preprocedural laboratory examination: Secondary | ICD-10-CM | POA: Diagnosis present

## 2019-12-02 DIAGNOSIS — Z20822 Contact with and (suspected) exposure to covid-19: Secondary | ICD-10-CM | POA: Insufficient documentation

## 2019-12-02 LAB — SARS CORONAVIRUS 2 (TAT 6-24 HRS): SARS Coronavirus 2: NEGATIVE

## 2019-12-04 ENCOUNTER — Other Ambulatory Visit: Payer: Self-pay

## 2019-12-04 ENCOUNTER — Ambulatory Visit: Payer: Managed Care, Other (non HMO)

## 2019-12-04 ENCOUNTER — Encounter (HOSPITAL_COMMUNITY): Payer: Self-pay | Admitting: *Deleted

## 2019-12-04 NOTE — Anesthesia Preprocedure Evaluation (Addendum)
Anesthesia Evaluation  Patient identified by MRN, date of birth, ID band Patient awake    Reviewed: Allergy & Precautions, NPO status , Patient's Chart, lab work & pertinent test results  Airway Mallampati: II  TM Distance: >3 FB Neck ROM: Full    Dental no notable dental hx. (+) Teeth Intact, Dental Advisory Given   Pulmonary asthma , former smoker,  Hx of Small Cell Lung CA S/P radiation td   Pulmonary exam normal breath sounds clear to auscultation       Cardiovascular negative cardio ROS Normal cardiovascular exam Rhythm:Regular Rate:Normal     Neuro/Psych  Neuromuscular disease negative psych ROS   GI/Hepatic Neg liver ROS, GERD  ,  Endo/Other  negative endocrine ROS  Renal/GU negative Renal ROS     Musculoskeletal  (+) Arthritis ,   Abdominal   Peds  Hematology   Anesthesia Other Findings Pt neck mass  swollen on L and hoarse  Reproductive/Obstetrics                            Anesthesia Physical Anesthesia Plan  ASA: III  Anesthesia Plan: General   Post-op Pain Management:    Induction: Intravenous  PONV Risk Score and Plan: 3 and Treatment may vary due to age or medical condition, Midazolam and Ondansetron  Airway Management Planned: LMA  Additional Equipment: None  Intra-op Plan:   Post-operative Plan:   Informed Consent: I have reviewed the patients History and Physical, chart, labs and discussed the procedure including the risks, benefits and alternatives for the proposed anesthesia with the patient or authorized representative who has indicated his/her understanding and acceptance.     Dental advisory given  Plan Discussed with: CRNA  Anesthesia Plan Comments:        Anesthesia Quick Evaluation

## 2019-12-04 NOTE — Progress Notes (Signed)
Spoke with pt for pre-op call. Pt denies any cardiac history, chart has "heart attack" listed but with a note stating that she passed out and was first told she had a heart attack, but then found out she did not. Denies HTN or Diabetes.  Covid test done 12/02/19 and it's negative. Pt states she has been in quarantine since test was done and will continue until she comes to the hospital Tuesday morning.

## 2019-12-05 ENCOUNTER — Encounter (HOSPITAL_COMMUNITY): Admission: RE | Disposition: A | Discharge status: Home / Self Care | Payer: Self-pay | Source: Home / Self Care

## 2019-12-05 ENCOUNTER — Ambulatory Visit (HOSPITAL_COMMUNITY): Payer: Managed Care, Other (non HMO) | Admitting: Anesthesiology

## 2019-12-05 ENCOUNTER — Other Ambulatory Visit: Payer: Self-pay

## 2019-12-05 ENCOUNTER — Ambulatory Visit: Payer: Managed Care, Other (non HMO)

## 2019-12-05 ENCOUNTER — Ambulatory Visit (HOSPITAL_COMMUNITY)
Admission: RE | Admit: 2019-12-05 | Discharge: 2019-12-05 | Disposition: A | Discharge status: Home / Self Care | Payer: Managed Care, Other (non HMO) | Attending: Physician Assistant | Admitting: Physician Assistant

## 2019-12-05 ENCOUNTER — Encounter (HOSPITAL_COMMUNITY): Payer: Self-pay

## 2019-12-05 ENCOUNTER — Ambulatory Visit (HOSPITAL_COMMUNITY)
Admission: RE | Admit: 2019-12-05 | Discharge: 2019-12-05 | Disposition: A | Discharge status: Home / Self Care | Payer: Managed Care, Other (non HMO) | Source: Ambulatory Visit | Attending: Physician Assistant | Admitting: Physician Assistant

## 2019-12-05 DIAGNOSIS — C7931 Secondary malignant neoplasm of brain: Secondary | ICD-10-CM | POA: Diagnosis not present

## 2019-12-05 DIAGNOSIS — C3490 Malignant neoplasm of unspecified part of unspecified bronchus or lung: Secondary | ICD-10-CM | POA: Diagnosis present

## 2019-12-05 DIAGNOSIS — M1990 Unspecified osteoarthritis, unspecified site: Secondary | ICD-10-CM | POA: Diagnosis not present

## 2019-12-05 DIAGNOSIS — J45909 Unspecified asthma, uncomplicated: Secondary | ICD-10-CM | POA: Diagnosis not present

## 2019-12-05 DIAGNOSIS — Z87891 Personal history of nicotine dependence: Secondary | ICD-10-CM | POA: Diagnosis not present

## 2019-12-05 DIAGNOSIS — C3491 Malignant neoplasm of unspecified part of right bronchus or lung: Secondary | ICD-10-CM

## 2019-12-05 HISTORY — DX: Other complications of anesthesia, initial encounter: T88.59XA

## 2019-12-05 HISTORY — PX: RADIOLOGY WITH ANESTHESIA: SHX6223

## 2019-12-05 HISTORY — DX: Gastro-esophageal reflux disease without esophagitis: K21.9

## 2019-12-05 HISTORY — DX: Pneumonia, unspecified organism: J18.9

## 2019-12-05 HISTORY — DX: Anemia, unspecified: D64.9

## 2019-12-05 HISTORY — DX: Constipation, unspecified: K59.00

## 2019-12-05 SURGERY — MRI WITH ANESTHESIA
Anesthesia: General

## 2019-12-05 MED ORDER — LIDOCAINE 2% (20 MG/ML) 5 ML SYRINGE
INTRAMUSCULAR | Status: DC | PRN
  Administered 2019-12-05: 80 mg via INTRAVENOUS

## 2019-12-05 MED ORDER — PROPOFOL 10 MG/ML IV BOLUS
INTRAVENOUS | Status: DC | PRN
  Administered 2019-12-05: 100 mg via INTRAVENOUS

## 2019-12-05 MED ORDER — LACTATED RINGERS IV SOLN: INTRAVENOUS | Status: DC

## 2019-12-05 MED ORDER — CHLORHEXIDINE GLUCONATE 0.12 % MT SOLN: 15.0000 mL | Freq: Once | OROMUCOSAL | Status: AC

## 2019-12-05 MED ORDER — MIDAZOLAM HCL 5 MG/5ML IJ SOLN
INTRAMUSCULAR | Status: DC | PRN
  Administered 2019-12-05: 2 mg via INTRAVENOUS

## 2019-12-05 MED ORDER — ORAL CARE MOUTH RINSE: 15.0000 mL | Freq: Once | OROMUCOSAL | Status: AC

## 2019-12-05 MED ORDER — GADOBUTROL 1 MMOL/ML IV SOLN
6.0000 mL | Freq: Once | INTRAVENOUS | Status: AC | PRN
  Administered 2019-12-05: 6 mL via INTRAVENOUS

## 2019-12-05 MED ORDER — ONDANSETRON HCL 4 MG/2ML IJ SOLN
INTRAMUSCULAR | Status: DC | PRN
  Administered 2019-12-05: 4 mg via INTRAVENOUS

## 2019-12-05 MED ORDER — FENTANYL CITRATE (PF) 250 MCG/5ML IJ SOLN
INTRAMUSCULAR | Status: DC | PRN
Start: 2019-12-05 — End: 2019-12-05
  Administered 2019-12-05: 12.5 ug via INTRAVENOUS

## 2019-12-05 MED ORDER — CHLORHEXIDINE GLUCONATE 0.12 % MT SOLN
OROMUCOSAL | Status: AC
  Administered 2019-12-05: 15 mL via OROMUCOSAL
  Filled 2019-12-05: qty 15

## 2019-12-05 NOTE — Transfer of Care (Signed)
Immediate Anesthesia Transfer of Care Note  Patient: Elizabeth Mayer  Procedure(s) Performed: MRI BRAIN WITH AND WITHOUT CONTRAST (N/A )  Patient Location: PACU  Anesthesia Type:General  Level of Consciousness: awake, alert  and oriented  Airway & Oxygen Therapy: Patient Spontanous Breathing  Post-op Assessment: Report given to RN, Post -op Vital signs reviewed and stable and Patient moving all extremities X 4  Post vital signs: Reviewed and stable  Last Vitals:  Vitals Value Taken Time  BP 124/70 12/05/19 1153  Temp    Pulse 74 12/05/19 1207  Resp 30 12/05/19 1207  SpO2 94 % 12/05/19 1207  Vitals shown include unvalidated device data.  Last Pain:  Vitals:   12/05/19 0816  TempSrc:   PainSc: 0-No pain      Patients Stated Pain Goal: 2 (78/41/28 2081)  Complications: No complications documented.

## 2019-12-05 NOTE — Progress Notes (Signed)
No labs needed per Dr. Valma Cava.

## 2019-12-05 NOTE — Anesthesia Postprocedure Evaluation (Signed)
Anesthesia Post Note  Patient: Elizabeth Mayer  Procedure(s) Performed: MRI BRAIN WITH AND WITHOUT CONTRAST (N/A )     Patient location during evaluation: PACU Anesthesia Type: General Level of consciousness: awake and alert Pain management: pain level controlled Vital Signs Assessment: post-procedure vital signs reviewed and stable Respiratory status: spontaneous breathing, nonlabored ventilation, respiratory function stable and patient connected to nasal cannula oxygen Cardiovascular status: blood pressure returned to baseline and stable Postop Assessment: no apparent nausea or vomiting Anesthetic complications: no   No complications documented.  Last Vitals:  Vitals:   12/05/19 1153 12/05/19 1223  BP: 124/70 (!) 118/56  Pulse: 72 77  Resp: (!) 34 (!) 30  Temp: 36.5 C 36.5 C  SpO2: 93% 90%    Last Pain:  Vitals:   12/05/19 1223  TempSrc:   PainSc: 0-No pain                 Barnet Glasgow

## 2019-12-06 ENCOUNTER — Encounter (HOSPITAL_COMMUNITY): Payer: Self-pay | Admitting: Radiology

## 2019-12-06 ENCOUNTER — Other Ambulatory Visit: Payer: Self-pay | Admitting: Radiation Oncology

## 2019-12-06 ENCOUNTER — Other Ambulatory Visit: Payer: Self-pay | Admitting: Internal Medicine

## 2019-12-06 ENCOUNTER — Telehealth: Payer: Self-pay | Admitting: Physician Assistant

## 2019-12-06 ENCOUNTER — Encounter: Payer: Self-pay | Admitting: Radiation Oncology

## 2019-12-06 ENCOUNTER — Ambulatory Visit
Admission: RE | Admit: 2019-12-06 | Discharge: 2019-12-06 | Disposition: A | Discharge status: Home / Self Care | Payer: Managed Care, Other (non HMO) | Source: Ambulatory Visit | Attending: Radiation Oncology | Admitting: Radiation Oncology

## 2019-12-06 DIAGNOSIS — C770 Secondary and unspecified malignant neoplasm of lymph nodes of head, face and neck: Secondary | ICD-10-CM | POA: Insufficient documentation

## 2019-12-06 DIAGNOSIS — C7931 Secondary malignant neoplasm of brain: Secondary | ICD-10-CM | POA: Insufficient documentation

## 2019-12-06 DIAGNOSIS — C3481 Malignant neoplasm of overlapping sites of right bronchus and lung: Secondary | ICD-10-CM | POA: Diagnosis present

## 2019-12-06 DIAGNOSIS — Z510 Encounter for antineoplastic radiation therapy: Secondary | ICD-10-CM | POA: Diagnosis not present

## 2019-12-06 DIAGNOSIS — Z87891 Personal history of nicotine dependence: Secondary | ICD-10-CM | POA: Diagnosis not present

## 2019-12-06 MED ORDER — FOLIC ACID 1 MG PO TABS: 1.0000 mg | ORAL_TABLET | Freq: Every day | ORAL | 4 refills | Status: DC

## 2019-12-06 MED ORDER — LIDOCAINE VISCOUS HCL 2 % MT SOLN: 15.0000 mL | OROMUCOSAL | 1 refills | Status: DC | PRN

## 2019-12-06 NOTE — Telephone Encounter (Signed)
I called the patient to discuss the results of the brain MRI. I will reach out to radiation oncology to see if they can see the patient to discuss treatment to these areas. The patient mentioned that she changed her mind about her treatment after discussing with her family. She would like to do immunotherapy and chemotherapy (carboplatin, Alimta, and Keytruda). I have updated Dr. Julien Nordmann and will update her patient education nurse for her appointment tomorrow.

## 2019-12-06 NOTE — Progress Notes (Signed)
DISCONTINUE SELECTED CLINICAL TRIAL - Non-Small Cell Lung  Other Clinical Trial: cempilimab  **Trial eligibility and accrual should be confirmed by your research team**  REASON: Other Reason PRIOR TREATMENT: Other Trial - cempilimab TREATMENT RESPONSE: Unable to Evaluate  START ON PATHWAY REGIMEN - Non-Small Cell Lung     A cycle is every 21 days:     Pembrolizumab      Pemetrexed      Carboplatin   **Always confirm dose/schedule in your pharmacy ordering system**  Patient Characteristics: Stage IV Metastatic, Nonsquamous, Initial Chemotherapy/Immunotherapy, PS = 0, 1, ALK Rearrangement Negative and ROS1 Rearrangement Negative and NTRK Gene Fusion?Negative and RET Gene Fusion?Negative and EGFR Mutation Negative, PD-L1 Expression Positive   ? 50% (TPS) and Immunotherapy Candidate Therapeutic Status: Stage IV Metastatic Histology: Nonsquamous Cell ROS1 Rearrangement Status: Negative Other Mutations/Biomarkers: No Other Actionable Mutations Chemotherapy/Immunotherapy LOT: Initial Chemotherapy/Immunotherapy Molecular Targeted Therapy: Not Appropriate KRAS G12C Mutation Status: Negative MET Exon 14 Mutation Status: Negative RET Gene Fusion Status: Negative EGFR Mutation Status: Negative/Wild Type NTRK Gene Fusion Status: Negative PD-L1 Expression Status: PD-L1 Positive ? 50% (TPS) ALK Rearrangement Status: Negative BRAF V600E Mutation Status: Negative ECOG Performance Status: 1 Biomarker Assessment Status Confirmation: All Genomic Markers Negative, or Only MET+ or BRAF+ or KRAS G12C+ Immunotherapy Candidate Status: Candidate for Immunotherapy Intent of Therapy: Non-Curative / Palliative Intent, Discussed with Patient

## 2019-12-07 ENCOUNTER — Other Ambulatory Visit: Payer: Self-pay

## 2019-12-07 ENCOUNTER — Inpatient Hospital Stay: Payer: Managed Care, Other (non HMO)

## 2019-12-07 ENCOUNTER — Inpatient Hospital Stay: Payer: Managed Care, Other (non HMO) | Attending: Physician Assistant

## 2019-12-07 ENCOUNTER — Other Ambulatory Visit: Payer: Self-pay | Admitting: Radiation Therapy

## 2019-12-07 ENCOUNTER — Other Ambulatory Visit: Payer: Self-pay | Admitting: Physician Assistant

## 2019-12-07 DIAGNOSIS — C7931 Secondary malignant neoplasm of brain: Secondary | ICD-10-CM | POA: Diagnosis not present

## 2019-12-07 DIAGNOSIS — K5900 Constipation, unspecified: Secondary | ICD-10-CM | POA: Diagnosis not present

## 2019-12-07 DIAGNOSIS — Z79899 Other long term (current) drug therapy: Secondary | ICD-10-CM | POA: Insufficient documentation

## 2019-12-07 DIAGNOSIS — R1310 Dysphagia, unspecified: Secondary | ICD-10-CM | POA: Diagnosis not present

## 2019-12-07 DIAGNOSIS — Z5112 Encounter for antineoplastic immunotherapy: Secondary | ICD-10-CM | POA: Insufficient documentation

## 2019-12-07 DIAGNOSIS — Z5111 Encounter for antineoplastic chemotherapy: Secondary | ICD-10-CM | POA: Insufficient documentation

## 2019-12-07 DIAGNOSIS — R5383 Other fatigue: Secondary | ICD-10-CM | POA: Insufficient documentation

## 2019-12-07 DIAGNOSIS — C3491 Malignant neoplasm of unspecified part of right bronchus or lung: Secondary | ICD-10-CM

## 2019-12-07 DIAGNOSIS — R634 Abnormal weight loss: Secondary | ICD-10-CM | POA: Diagnosis not present

## 2019-12-07 MED ORDER — CYANOCOBALAMIN 1000 MCG/ML IJ SOLN
1000.0000 ug | Freq: Once | INTRAMUSCULAR | Status: AC
  Administered 2019-12-07: 1000 ug via INTRAMUSCULAR

## 2019-12-07 NOTE — Patient Instructions (Signed)

## 2019-12-09 ENCOUNTER — Other Ambulatory Visit: Payer: Self-pay | Admitting: Radiation Oncology

## 2019-12-09 MED ORDER — HYDROCODONE-ACETAMINOPHEN 7.5-325 MG/15ML PO SOLN
10.0000 mL | Freq: Four times a day (QID) | ORAL | 0 refills | Status: DC | PRN
Start: 2019-12-09 — End: 2020-01-18

## 2019-12-12 NOTE — Progress Notes (Signed)
[place] OFFICE PROGRESS NOTE  Elizabeth Mayer, [address] [place] [address]  DIAGNOSIS: Stage IV non-small cell lung cancer, most consistent with adenocarcinoma.The patient presented with a right perihilar lung mass which encases and occludes the right lower lobe pulmonary artery. There is also associated case meant of the right upper lobe, right middle lobe, and right lower lobe bronchi. The polyp patient also has associated bilateral mediastinal, right axillary, cervical, and supraclavicular lymphadenopathy. The patient was diagnosed in July 2021  PDL1 Expression:  95%  Biomarker Findings Tumor Mutational Burden - 16 Muts/Mb Microsatellite status - MS-Stable Genomic Findings For a complete list of the genes assayed, please refer to the Appendix. KRAS G12V PTEN loss exon 2 KDM6A deletion exons 6-21 TERT promoter -124C>T TP53 V274F 7 Disease relevant genes with no reportable alterations: ALK, BRAF, EGFR, ERBB2, MET, RET, ROS1  Tumor Proportion Score (TPS) ( 95%)  PRIOR THERAPY: Palliative radiotherapy to the lung mass which is causing encasement of the right upper lobe, right middle lobe, and right lower lobe as well as palliative radiotherapy to the painful cervical adenopathy under the care of Dr. Sondra Come.  Last dose of radiation on 12/04/2019   CURRENT THERAPY: Palliative systemic chemotherapy with carboplatin for an AUC of 5, Alimta 500 mg per metered squared, Keytruda 200 mg IV every 3 weeks.  First dose expected on 12/12/19.   INTERVAL HISTORY: Elizabeth Mayer 59 y.o. female returns to clinic today fora  follow-up visit.  The patient is feeling fair today without any concerning complaints except for dysphagia. She states she is having a hard time swallowing large pills and they feel like they are getting stuck.  The patient completed palliative radiotherapy to the lung mass on 12/04/2019.  The patient initially had decided on undergoing  single agent immunotherapy; however, after discussion with her friends and family, she decided to pursue treatment with chemotherapy and immunotherapy and she is expected to receive her first dose of treatment today.  The patient underwent her initial staging brain MRI.  Due to the patient's claustrophobia, she had to perform her MRI under sedation.  Unfortunately, the patient's brain MRI showed for intraparenchymal supratentorial metastasis with mild edema.  She was referred to radiation oncology.  They are in the process of trying to arrange her [place] brain MRI under sedation again which will take place on 12/26/19 and she will have a consultation with Dr. Isidore Moos on 12/27/19.   Otherwise, the patient also saw a pulmonologist, Dr. Lamonte Sakai a few weeks ago who assessed her to see if she qualified for portable oxygen.  He also recommended a SABA inhaler.  She continues to endorse shortness of breath with lying flat.  She states she has a pox oximeter at home and it reportedly reads 83% when she lies flat.  To her knowledge, she did not qualify for oxygen.  The patient denies any fever, chills, or night sweats.  She reportedly lost 1 to 2 pounds secondary to swelling issues secondary to radiation treatment.  She uses liquid hydrocodone for her swallowing pain.  She reports nausea for which she takes Compazine.  She denies any vomiting or diarrhea.  She reports constipation for which she has changed her dietary habits to include more vegetables and started Dulcolax..  She has bulky disease in the neck for which she completed palliative radiotherapy.  She often describes a "pulling sensation into her neck".  She still continues to have ptosis of her left eye.  The patient is here today for evaluation before starting cycle number #1 of her treatment.    MEDICAL HISTORY: Past Medical History:  Diagnosis Date  . Anemia   . Angina 1982   related to stress  . Asthma    in the past, not now- never inhlaer use  .  Cancer ([place])    lung  . Complication of anesthesia    states anesthesia made her hair fall out  . Constipation   . GERD (gastroesophageal reflux disease)   . Heart murmur 1970s  . Ingrown toenail   . Past heart attack 1980-1981   pt states she passed out and woke up in hospital- told she had heart attack, but then dr said he couldn't find anything wrong.  . Pneumonia     ALLERGIES:  is allergic to pseudoephedrine, gabapentin, mobic [meloxicam], and penicillins.  MEDICATIONS:  Current Outpatient Medications  Medication Sig Dispense Refill  . acetaminophen (TYLENOL) 500 MG tablet Take 1,000 mg by mouth every 6 (six) hours as needed for moderate pain.    . Artificial Saliva (THERABREATH DRY MOUTH) LOZG Use as directed 1 drop in the mouth or throat as needed (dry mouth).    . Carboxymethylcellul-Glycerin (LUBRICATING EYE DROPS OP) Place 1 drop into both eyes daily as needed (irritation).    . Cream Base (WOUND CARE) CREA Apply 1 application topically as needed (wound care).    . cyclobenzaprine (FLEXERIL) 10 MG tablet Take 10 mg by mouth 3 (three) times daily as needed for muscle spasms.    Marland Kitchen DIGESTIVE ENZYMES PO Take 0.5-1 tablets by mouth 3 (three) times daily before meals.    . fexofenadine (ALLEGRA) 180 MG tablet Take 180 mg by mouth at bedtime.    . fluconazole (DIFLUCAN) 100 MG tablet Take 1 tablet (100 mg total) by mouth daily. 7 tablet 0  . fluticasone (FLONASE) 50 MCG/ACT nasal spray Place 2 sprays into both nostrils daily.    . folic acid (FOLVITE) 1 MG tablet Take 1 tablet (1 mg total) by mouth daily. 30 tablet 4  . guaiFENesin (MUCINEX) 600 MG 12 hr tablet Take 600 mg by mouth 2 (two) times daily. (0800 & 2000)    . HYDROcodone-acetaminophen (HYCET) 7.5-325 mg/15 ml solution Take 10 mLs by mouth 4 (four) times daily as needed for moderate pain. For pain from cancer and radiation reactions 473 mL 0  . HYDROcodone-acetaminophen (NORCO/VICODIN) 5-325 MG tablet Take 1-2 tablets by  mouth every 6 (six) hours as needed for moderate pain. 60 tablet 0  . lidocaine (XYLOCAINE) 2 % solution Use as directed 15 mLs in the mouth or throat as needed for mouth pain. Swallow 30 min prior to meals and at bedtime for throat/esophagus pain 250 mL 1  . ondansetron (ZOFRAN) 4 MG tablet Take 1 tablet (4 mg total) by mouth every 8 (eight) hours as needed for nausea or vomiting. 20 tablet 0  . orphenadrine (NORFLEX) 100 MG tablet Take 1 tablet (100 mg total) by mouth 2 (two) times daily as needed for muscle spasms. 60 tablet 1  . prochlorperazine (COMPAZINE) 10 MG tablet Take 1 tablet (10 mg total) by mouth every 6 (six) hours as needed. (Patient taking differently: Take 10 mg by mouth every 6 (six) hours as needed for nausea or vomiting. ) 30 tablet 2   No current facility-administered medications for this visit.    SURGICAL HISTORY:  Past Surgical History:  Procedure Laterality Date  . ingrown toe nail surgery    .  NECK SURGERY  2000   Disc removed from neck   . RADIOLOGY WITH ANESTHESIA N/A 12/05/2019   Procedure: MRI BRAIN WITH AND WITHOUT CONTRAST;  Surgeon: Radiologist, Medication, MD;  Location: [facility];  Service: Radiology;  Laterality: N/A;    REVIEW OF SYSTEMS:   Review of Systems  Constitutional:Positive for fatigue and weight loss.Negative for appetite change, chills, and fever. HENT: Positive for dysphagia.  Negative for mouth sores, nosebleeds, and sore throat. Eyes:Positive for ptosis on left eyelid.Negative fordiplopia, photophobia, visual field defects, oricterus.  Respiratory:Positive for cough and shortness of breath.Negative for wheezing.  Cardiovascular: Negative for chest pain and leg swelling.  Gastrointestinal:Positive for mild baseline constipation.Negative for abdominal pain, diarrhea, nausea and vomiting.  Genitourinary: Negative for bladder incontinence, difficulty urinating, dysuria, frequency and hematuria.  Musculoskeletal:Positive for neck  pain and stiffness.Negative for back pain gait problem. Skin: Negative for itching and rash.  Neurological: Negative for dizziness, extremity weakness, gait problem, headaches, light-headedness and seizures.  Hematological:Positive for bilateral cervical/supraclavicular adenopathy (L>R). Positive for right axillary adenopathy.Does not bruise/bleed easily.  Psychiatric/Behavioral: Negative for confusion, depression and sleep disturbance. The patient is not nervous/anxious.    PHYSICAL EXAMINATION:  There were no vitals taken for this visit.  ECOG PERFORMANCE STATUS: 1 - Symptomatic but completely ambulatory  Physical Exam  Constitutional: Oriented to person, place, and time and well-developed, well-nourished, and in no distress.  HENT:  Head: Normocephalic and atraumatic.  Mouth/Throat: Oropharynx is clear and moist. Some white coating on tongue.  Eyes:Ptosis noted on left eyelid.Conjunctivae are normal. Right eye exhibits no discharge. Left eye exhibits no discharge. No scleral icterus.  Neck:Bilateral cervical/supraclavicular adenopathy (L>R). Range of motion limited by large neck mass.Neck supple.  Cardiovascular: Normal rate, regular rhythm, normal heart sounds and intact distal pulses.  Pulmonary/Chest: Effort normal. Decreased breath sounds in right lung.No respiratory distress. No wheezes. No rales.  Abdominal: Soft. Bowel sounds are normal. Exhibits no distension and no mass. There is no tenderness.  Musculoskeletal: Normal range of motion. Exhibits no edema.  Lymphadenopathy:  Right axillary, bilateral cervical and supraclavicular adenopathy..  Neurological: Alert and oriented to person, place, and time. Exhibits normal muscle tone. Gait normal. Coordination normal.  Skin: Skin is warm and dry. No rash noted. Not diaphoretic. No erythema. No pallor.  Psychiatric: Mood, memory and judgment normal.  Vitals reviewed.  LABORATORY DATA: Lab Results  Component Value Date    WBC 4.6 12/14/2019   HGB 13.0 12/14/2019   HCT 39.4 12/14/2019   MCV 87.2 12/14/2019   PLT 175 12/14/2019      Chemistry      Component Value Date/Time   NA 141 12/14/2019 0731   NA 139 07/31/2019 1518   K 3.1 (L) 12/14/2019 0731   CL 103 12/14/2019 0731   CO2 27 12/14/2019 0731   BUN 6 (L) 12/14/2019 0731   BUN 8 07/31/2019 1518   CREATININE 0.66 12/14/2019 0731      Component Value Date/Time   CALCIUM 9.4 12/14/2019 0731   ALKPHOS 47 12/14/2019 0731   AST 12 (L) 12/14/2019 0731   ALT <6 12/14/2019 0731   BILITOT 0.4 12/14/2019 0731       RADIOGRAPHIC STUDIES:  DG Chest 2 View  Result Date: 11/28/2019 CLINICAL DATA:  Shortness of breath. Adenoma squamous carcinoma of the lung. EXAM: CHEST - 2 VIEW COMPARISON:  CT chest 10/17/2019 FINDINGS: Heart size appears within normal limits. There is complete atelectasis of the right middle lobe secondary to previously identified right perihilar  lung mass. Subsegmental atelectasis involving the left lower lobe is similar to previous chest CT. No superimposed airspace consolidation identified. IMPRESSION: 1. Unchanged appearance of complete right middle lobe atelectasis and subsegmental atelectasis of the left lower lobe. No acute superimposed cardiopulmonary abnormalities noted. Electronically Signed   By: Kerby Moors M.D.   On: 11/28/2019 10:07   MR Brain W Wo Contrast  Result Date: 12/06/2019 CLINICAL DATA:  Non-small cell lung carcinoma staging EXAM: MRI HEAD WITHOUT AND WITH CONTRAST TECHNIQUE: Multiplanar, multiecho pulse sequences of the brain and surrounding structures were obtained without and with intravenous contrast. CONTRAST:  80m GADAVIST GADOBUTROL 1 MMOL/ML IV SOLN COMPARISON:  None. FINDINGS: Brain: No acute infarct, acute hemorrhage or extra-axial collection. Normal white matter signal. Normal volume of CSF spaces. No chronic microhemorrhage. Normal midline structures. There are for contrast-enhancing lesions: 1.  Posterior right parietal lobe with mild adjacent edema, 8 mm, series 10, image 34. There is serpiginous enhancement extending inferiorly from this lesion. 2. Right precentral gyrus, 2 mm, image 38 3. Right medial frontal gyrus, 8 mm, image 40 4. Left superior frontal gyrus, 5 mm, image 41 Vascular: Normal flow voids. Skull and upper cervical spine: Normal marrow signal. Sinuses/Orbits: Negative. Other: None IMPRESSION: Four intraparenchymal supratentorial metastases with mild edema. No midline shift or other significant mass effect. Electronically Signed   By: KUlyses JarredM.D.   On: 12/06/2019 02:36   NM PET Image Initial (PI) Skull Base To Thigh  Result Date: 11/20/2019 CLINICAL DATA:  Initial treatment strategy for non-small cell lung cancer. EXAM: NUCLEAR MEDICINE PET SKULL BASE TO THIGH TECHNIQUE: 7.2 mCi F-18 FDG was injected intravenously. Full-ring PET imaging was performed from the skull base to thigh after the radiotracer. CT data was obtained and used for attenuation correction and anatomic localization. Fasting blood glucose: 110 mg/dl COMPARISON:  Multiple exams, including CT chest from 10/17/2019 FINDINGS: Mediastinal blood pool activity: SUV max 2.1 Liver activity: SUV max N/A NECK: Hypermetabolic left level III, bilateral level V, and bilateral level IV adenopathy in the lower neck. Dominant left level III lymph node measures 3.6 cm in short axis has central necrosis and a maximum SUV of 21.9. Incidental CT findings: none CHEST: Bilateral supraclavicular, left subpectoral, and right axillary adenopathy noted with infiltrative conglomerate prevascular, paratracheal, and AP window adenopathy along with individually hypermetabolic prevascular, right hilar, left hilar, subcarinal, and distal paraesophageal lymph nodes. Index right axillary node 2.4 cm in short axis with maximum SUV 14.1. Index conglomerate prevascular and paratracheal adenopathy maximum SUV 19.3. Right hilar mass more closely  associated with the right lower lobe and right middle lobe, maximum SUV 17.3, measuring 3.9 cm in long axis. Tumor along the right pleural-pericardial margin medially measuring up to 1.5 cm in thickness on image 68/4, maximum SUV 17.1 Incidental CT findings: Centrilobular emphysema. Right-sided airway thickening with some scattered volume loss in the right upper lobe, right middle lobe, and superior segment right lower lobe. Trace left pleural effusion, nonspecific for transudative or exudative etiology. ABDOMEN/PELVIS: The left adrenal nodule measuring about 0.8 by 1.2 cm on the prior CT chest has an internal density of 16 Hounsfield units (indeterminate) but a maximum SUV of only about 3.5 which is indeterminate. There is faintly accentuated activity in a 0.5 cm right gastric lymph node with maximum SUV of 3.4. Incidental CT findings: Aortoiliac atherosclerotic vascular disease. SKELETON: No significant abnormal hypermetabolic activity in this region. Incidental CT findings: Degenerative disc disease and degenerative endplate sclerosis at LL7-L8 IMPRESSION: 1.  Right hilar mass with extensive lower neck and thoracic adenopathy. The lower neck nodal involvement extends beyond scalene/supraclavicular nodes accordingly constitutes extra thoracic metastatic disease. Appearance compatible with T3 N3 M1 disease (stage IV). 2. The left adrenal nodule is not overtly hypermetabolic but only measures 0.8 by 1.2 cm. This nodule was not present on the CT abdomen from 10/06/2017 and has a density of 16 Hounsfield units precontrast which is indeterminate. Surveillance recommended. 3.  Aortic Atherosclerosis (ICD10-I70.0). 4. Trace left pleural effusion. Electronically Signed   By: Van Clines M.D.   On: 11/20/2019 16:47     ASSESSMENT/PLAN:  This is a very pleasant 64 year oldAfrican Americanfemale diagnosed withmetastatic non-small cell lung cancer, most consistent with adenocarcinoma.The patient presented with  a right perihilar lung mass which encases and occludes the right lower lobe pulmonary artery. There is also associated encasement of the right upper lobe, right middle lobe, and right lower lobe bronchi. The patient also has associated bilateral mediastinal, right axillary, cervical, and supraclavicular lymphadenopathy. The patient was diagnosed in July 2021.  Her PD-L1 expression is 95%.  Her foundation 1 molecular studies show no actionable mutations  The patient completed palliative radiotherapy to the bulky cervical lymphadenopathy and the obstructive mass which is encasing the right upper lobe, right middle lobe, and right lower lobe.  She is receiving radiation under the care of Dr. Sondra Come.  She received her last dose of radiotherapy on 12/04/2019.  The patient was given the option of single agent immunotherapy versus starting palliative systemic chemotherapy/immunotherapy.  The patient decided to pursue systemic palliative chemotherapy/immunotherapy with carboplatin for an AUC of 5, Alimta 500 mg per metered squared, Keytruda 200 mg IV every 3 weeks.  She is expected to receive her first dose of treatment today.  The patient was seen with Dr. Julien Nordmann.  Labs were reviewed.  Recommend that she proceed cycle #1 today scheduled.  We will see her back for follow-up visit in 1 week for evaluation and to manage any adverse side effects of treatment.  She will continue to follow with radiation oncology regarding her brain metastases.  Regarding the patient's dysphagia, she had evidence of thrush on her exam today.  I will send a prescription for Diflucan 100 mg p.o. daily to her pharmacy.  Additionally I will send a referral to gastroenterology for evaluation of her dysphagia and to assess to see if she has a stricture or other cause of her dysphagia.  The patient inquired about a handicap placard which is appropriate given her stage IV lung malignancy and shortness of breath with exertion.  We will  perform a walking assessment with the patient today to see if she qualifies for home oxygen.  The patient has not received her inhaler that was recommended by pulmonology.  I instructed her to call their office regarding this.  The patient was unable to take any for pain medication prior to coming to her appointment today.  We will give her 1 tablet of Percocet while in the clinic today.  The patient's potassium is slightly low today.  I will send a prescription for 20 mg equivalents p.o. daily to her pharmacy of potassium.  Due to her dysphagia, discussed that she may crush this and put this in applesauce if that helps facilitate swallowing.  The patient was advised to call immediately if she has any concerning symptoms in the interval. The patient voices understanding of current disease status and treatment options and is in agreement with the current care plan. All  questions were answered. The patient knows to call the clinic with any problems, questions or concerns. We can certainly see the patient much sooner if necessary       Orders Placed This Encounter  Procedures  . CBC with Differential (Cancer Center Only)    Standing Status:   Standing    Number of Occurrences:   12    Standing Expiration Date:   12/13/2020  . CMP ([facility] only)    Standing Status:   Standing    Number of Occurrences:   12    Standing Expiration Date:   12/13/2020  . TSH    Standing Status:   Standing    Number of Occurrences:   6    Standing Expiration Date:   12/13/2020  . Ambulatory referral to Gastroenterology    Referral Priority:   Routine    Referral Type:   Consultation    Referral Reason:   Specialty Services Required    Number of Visits Requested:   [place], PA-C 12/14/19  ADDENDUM: Hematology/Oncology Attending: I had a face-to-face encounter with the patient today.  I recommended her care plan.  This is a very pleasant 59 years old African-American female  recently diagnosed with a stage IV non-small cell lung cancer, adenocarcinoma with no actionable mutations and PD-L1 expression of 95%. The patient had a recent MRI of the brain that showed evidence for 4 subcentimeter brain lesions.  She is scheduled to have repeat MRI with SRS protocol under the care of Dr. Isidore Moos. The patient was initially interested in treatment with single agent immunotherapy with Libtayo (Cempilimab) but she changes her mind and she would like to proceed with a combination of systemic chemotherapy and immunotherapy. I discussed this option with the patient and recommended for her treatment with carboplatin for AUC of 5, Alimta 500 mg/M2 and Keytruda 200 mg IV every 3 weeks. We discussed with the patient the adverse effect of this treatment including but not limited to alopecia, myelosuppression, nausea and vomiting, peripheral neuropathy, liver or renal dysfunction as well as immunotherapy adverse effects.  She already received her chemo education class.  She also received vitamin B12 injection and started folic acid 1 mg p.o. daily. I recommended for the patient to proceed with the treatment today as planned. We will see her back for follow-up visit in 1 week for evaluation and management of any adverse effect of her treatment. She was advised to call immediately if she has any concerning symptoms in the interval.  Disclaimer: This note was dictated with voice recognition software. Similar sounding words can inadvertently be transcribed and may be missed upon review. Eilleen Kempf, MD 12/14/19

## 2019-12-13 ENCOUNTER — Other Ambulatory Visit: Payer: Self-pay | Admitting: Medical Oncology

## 2019-12-13 DIAGNOSIS — C3491 Malignant neoplasm of unspecified part of right bronchus or lung: Secondary | ICD-10-CM

## 2019-12-14 ENCOUNTER — Inpatient Hospital Stay: Payer: Managed Care, Other (non HMO)

## 2019-12-14 ENCOUNTER — Other Ambulatory Visit: Payer: Self-pay

## 2019-12-14 ENCOUNTER — Inpatient Hospital Stay: Payer: Managed Care, Other (non HMO) | Admitting: Physician Assistant

## 2019-12-14 ENCOUNTER — Encounter: Payer: Self-pay | Admitting: Physician Assistant

## 2019-12-14 VITALS — BP 133/71 | HR 91 | Temp 98.9°F | Resp 20

## 2019-12-14 DIAGNOSIS — C3491 Malignant neoplasm of unspecified part of right bronchus or lung: Secondary | ICD-10-CM | POA: Diagnosis not present

## 2019-12-14 DIAGNOSIS — E876 Hypokalemia: Secondary | ICD-10-CM | POA: Diagnosis not present

## 2019-12-14 DIAGNOSIS — Z5112 Encounter for antineoplastic immunotherapy: Secondary | ICD-10-CM

## 2019-12-14 DIAGNOSIS — R1310 Dysphagia, unspecified: Secondary | ICD-10-CM | POA: Diagnosis not present

## 2019-12-14 DIAGNOSIS — Z5111 Encounter for antineoplastic chemotherapy: Secondary | ICD-10-CM

## 2019-12-14 DIAGNOSIS — B370 Candidal stomatitis: Secondary | ICD-10-CM | POA: Diagnosis not present

## 2019-12-14 DIAGNOSIS — Z7189 Other specified counseling: Secondary | ICD-10-CM

## 2019-12-14 LAB — CBC WITH DIFFERENTIAL (CANCER CENTER ONLY)
Abs Immature Granulocytes: 0.01 10*3/uL (ref 0.00–0.07)
Basophils Absolute: 0 10*3/uL (ref 0.0–0.1)
Basophils Relative: 1 %
Eosinophils Absolute: 0.2 10*3/uL (ref 0.0–0.5)
Eosinophils Relative: 4 %
HCT: 39.4 % (ref 36.0–46.0)
Hemoglobin: 13 g/dL (ref 12.0–15.0)
Immature Granulocytes: 0 %
Lymphocytes Relative: 7 %
Lymphs Abs: 0.3 10*3/uL — ABNORMAL LOW (ref 0.7–4.0)
MCH: 28.8 pg (ref 26.0–34.0)
MCHC: 33 g/dL (ref 30.0–36.0)
MCV: 87.2 fL (ref 80.0–100.0)
Monocytes Absolute: 0.6 10*3/uL (ref 0.1–1.0)
Monocytes Relative: 12 %
Neutro Abs: 3.5 10*3/uL (ref 1.7–7.7)
Neutrophils Relative %: 76 %
Platelet Count: 175 10*3/uL (ref 150–400)
RBC: 4.52 MIL/uL (ref 3.87–5.11)
RDW: 12.6 % (ref 11.5–15.5)
WBC Count: 4.6 10*3/uL (ref 4.0–10.5)
nRBC: 0 % (ref 0.0–0.2)

## 2019-12-14 LAB — CMP (CANCER CENTER ONLY)
ALT: <6 U/L (ref 0–44)
AST: 12 U/L — ABNORMAL LOW (ref 15–41)
Albumin: 3.4 g/dL — ABNORMAL LOW (ref 3.5–5.0)
Alkaline Phosphatase: 47 U/L (ref 38–126)
Anion gap: 11 (ref 5–15)
BUN: 6 mg/dL — ABNORMAL LOW (ref 8–23)
CO2: 27 mmol/L (ref 22–32)
Calcium: 9.4 mg/dL (ref 8.9–10.3)
Chloride: 103 mmol/L (ref 98–111)
Creatinine: 0.66 mg/dL (ref 0.44–1.00)
GFR, Est AFR Am: >60 mL/min (ref >60)
GFR, Estimated: >60 mL/min (ref >60)
Glucose, Bld: 87 mg/dL (ref 70–99)
Potassium: 3.1 mmol/L — ABNORMAL LOW (ref 3.5–5.1)
Sodium: 141 mmol/L (ref 135–145)
Total Bilirubin: 0.4 mg/dL (ref 0.3–1.2)
Total Protein: 7.1 g/dL (ref 6.5–8.1)

## 2019-12-14 MED ORDER — POTASSIUM CHLORIDE CRYS ER 20 MEQ PO TBCR: 20.0000 meq | EXTENDED_RELEASE_TABLET | Freq: Every day | ORAL | 0 refills | Status: DC

## 2019-12-14 MED ORDER — SODIUM CHLORIDE 0.9 % IV SOLN
200.0000 mg | Freq: Once | INTRAVENOUS | Status: AC
  Administered 2019-12-14: 200 mg via INTRAVENOUS
  Filled 2019-12-14: qty 8

## 2019-12-14 MED ORDER — FLUCONAZOLE 100 MG PO TABS: 100.0000 mg | ORAL_TABLET | Freq: Every day | ORAL | 0 refills | Status: DC

## 2019-12-14 MED ORDER — SODIUM CHLORIDE 0.9 % IV SOLN
500.0000 mg/m2 | Freq: Once | INTRAVENOUS | Status: AC
  Administered 2019-12-14: 800 mg via INTRAVENOUS
  Filled 2019-12-14: qty 20

## 2019-12-14 MED ORDER — PALONOSETRON HCL INJECTION 0.25 MG/5ML
INTRAVENOUS | Status: AC
  Filled 2019-12-14: qty 5

## 2019-12-14 MED ORDER — SODIUM CHLORIDE 0.9 % IV SOLN
150.0000 mg | Freq: Once | INTRAVENOUS | Status: AC
  Administered 2019-12-14: 150 mg via INTRAVENOUS
  Filled 2019-12-14: qty 150

## 2019-12-14 MED ORDER — OXYCODONE HCL 5 MG PO TABS
5.0000 mg | ORAL_TABLET | Freq: Once | ORAL | Status: AC
  Administered 2019-12-14: 5 mg via ORAL

## 2019-12-14 MED ORDER — SODIUM CHLORIDE 0.9 % IV SOLN
484.5000 mg | Freq: Once | INTRAVENOUS | Status: AC
  Administered 2019-12-14: 480 mg via INTRAVENOUS
  Filled 2019-12-14: qty 48

## 2019-12-14 MED ORDER — PALONOSETRON HCL INJECTION 0.25 MG/5ML
0.2500 mg | Freq: Once | INTRAVENOUS | Status: AC
  Administered 2019-12-14: 0.25 mg via INTRAVENOUS

## 2019-12-14 MED ORDER — OXYCODONE HCL 5 MG PO TABS
ORAL_TABLET | ORAL | Status: AC
  Filled 2019-12-14: qty 1

## 2019-12-14 MED ORDER — ACETAMINOPHEN 325 MG PO TABS
325.0000 mg | ORAL_TABLET | Freq: Once | ORAL | Status: AC
  Administered 2019-12-14: 325 mg via ORAL

## 2019-12-14 MED ORDER — ACETAMINOPHEN 325 MG PO TABS
ORAL_TABLET | ORAL | Status: AC
  Filled 2019-12-14: qty 1

## 2019-12-14 MED ORDER — SODIUM CHLORIDE 0.9 % IV SOLN
Freq: Once | INTRAVENOUS | Status: AC
  Filled 2019-12-14: qty 250

## 2019-12-14 MED ORDER — SODIUM CHLORIDE 0.9 % IV SOLN
10.0000 mg | Freq: Once | INTRAVENOUS | Status: AC
  Administered 2019-12-14: 10 mg via INTRAVENOUS
  Filled 2019-12-14: qty 10

## 2019-12-14 NOTE — Patient Instructions (Signed)
Summary:  -There are two main categories of lung cancer, they are named based on the size of the cancer cell. One is called Non-Small cell lung cancer. The other type is Small Cell Lung Cancer -The sample (biopsy) that they took of your tumor was consistent with a subtype of Non-small cell lung cancer called Adenocarcinoma. This is the most common type of lung cancer.  -We covered a lot of important information at your appointment today regarding what the treatment plan is moving forward. Here are the the main points that were discussed at your office visit with [place] today:  -The treatment that you will receive consists of two chemotherapy drugs, called Carboplatin and Alimta (also called Pemetrexed) and one immunotherapy drug called Keytruda (pembrolizumab).  -We are planning on starting your treatment today but before your start your treatment -Your treatment will be given once every 3 weeks. We will check your labs once a week for the first ~5 treatments just to make sure that important components of your blood are in an acceptable range -We will get a CT scan after 3 treatments to check on the progress of treatment  Medications:  -I have sent a few important medication prescriptions to your pharmacy.  -Compazine was sent to your pharmacy. This medication is for nausea. You may take this every 6 hours as needed if you feel nauseous.  -I have also sent a prescription for 1 mg of folic acid to your pharmacy. We need you to take 1 tablet every day.  -We will administer vitamin B12 every 9 weeks while you are here in the clinic.   Referrals or Imaging: -Please follow up with Radiation oncology for the MRI and treatment for the small spots in the brain.    Follow up:  -We will see you back for a follow up visit 1 week after your first treatment to see how it went and help manage any side effects of treatment that you may have   -If you need to reach [place] at any time, the main office number to the  cancer center is [phone], when you call, ask to speak to either Cassie's or Dr. Worthy Flank nurse.

## 2019-12-14 NOTE — Progress Notes (Signed)
O2 Determination Note  Baseline O2 saturation on room air at rest: 97% O2 saturation on room air while ambulating: 85% O2 saturation on 2L while ambulating: 93% O2 saturation post ambulation on room air: 87% O2 saturation post ambulation on 2L: 93%  Patient did experience some SOB while ambulating; respiration rate around 24 breaths/minute.

## 2019-12-14 NOTE — Patient Instructions (Signed)
[facility] Discharge Instructions for Patients Receiving Chemotherapy  Today you received the following chemotherapy agents: pembrolizumab, pemetrexed, and carboplatin.  To help prevent nausea and vomiting after your treatment, we encourage you to take your nausea medication as prescribed.   If you develop nausea and vomiting that is not controlled by your nausea medication, call the clinic.   BELOW ARE SYMPTOMS THAT SHOULD BE REPORTED IMMEDIATELY:  *FEVER GREATER THAN 100.5 F  *CHILLS WITH OR WITHOUT FEVER  NAUSEA AND VOMITING THAT IS NOT CONTROLLED WITH YOUR NAUSEA MEDICATION  *UNUSUAL SHORTNESS OF BREATH  *UNUSUAL BRUISING OR BLEEDING  TENDERNESS IN MOUTH AND THROAT WITH OR WITHOUT PRESENCE OF ULCERS  *URINARY PROBLEMS  *BOWEL PROBLEMS  UNUSUAL RASH Items with * indicate a potential emergency and should be followed up as soon as possible.  Feel free to call the clinic should you have any questions or concerns. The clinic phone number is [phone].  Please show the [facility] at check-in to the Emergency Department and triage nurse.    Pembrolizumab injection Beryle Flock) What is this medicine? PEMBROLIZUMAB (pem broe liz ue mab) is a monoclonal antibody. It is used to treat certain types of cancer. This medicine may be used for other purposes; ask your health care provider or pharmacist if you have questions. COMMON BRAND NAME(S): Keytruda What should I tell my health care provider before I take this medicine? They need to know if you have any of these conditions:  diabetes  immune system problems  inflammatory bowel disease  liver disease  lung or breathing disease  lupus  received or scheduled to receive an organ transplant or a stem-cell transplant that uses donor stem cells  an unusual or allergic reaction to pembrolizumab, other medicines, foods, dyes, or preservatives  pregnant or trying to get  pregnant  breast-feeding How should I use this medicine? This medicine is for infusion into a vein. It is given by a health care professional in a hospital or clinic setting. A special MedGuide will be given to you before each treatment. Be sure to read this information carefully each time. Talk to your pediatrician regarding the use of this medicine in children. While this drug may be prescribed for children as young as 6 months for selected conditions, precautions do apply. Overdosage: If you think you have taken too much of this medicine contact a poison control center or emergency room at once. NOTE: This medicine is only for you. Do not share this medicine with others. What if I miss a dose? It is important not to miss your dose. Call your doctor or health care professional if you are unable to keep an appointment. What may interact with this medicine? Interactions have not been studied. Give your health care provider a list of all the medicines, herbs, non-prescription drugs, or dietary supplements you use. Also tell them if you smoke, drink alcohol, or use illegal drugs. Some items may interact with your medicine. This list may not describe all possible interactions. Give your health care provider a list of all the medicines, herbs, non-prescription drugs, or dietary supplements you use. Also tell them if you smoke, drink alcohol, or use illegal drugs. Some items may interact with your medicine. What should I watch for while using this medicine? Your condition will be monitored carefully while you are receiving this medicine. You may need blood work done while you are taking this medicine. Do not become pregnant while taking this medicine or for 4  months after stopping it. Women should inform their doctor if they wish to become pregnant or think they might be pregnant. There is a potential for serious side effects to an unborn child. Talk to your health care professional or pharmacist for  more information. Do not breast-feed an infant while taking this medicine or for 4 months after the last dose. What side effects may I notice from receiving this medicine? Side effects that you should report to your doctor or health care professional as soon as possible:  allergic reactions like skin rash, itching or hives, swelling of the face, lips, or tongue  bloody or black, tarry  breathing problems  changes in vision  chest pain  chills  confusion  constipation  cough  diarrhea  dizziness or feeling faint or lightheaded  fast or irregular heartbeat  fever  flushing  joint pain  low blood counts - this medicine may decrease the number of white blood cells, red blood cells and platelets. You may be at increased risk for infections and bleeding.  muscle pain  muscle weakness  pain, tingling, numbness in the hands or feet  persistent headache  redness, blistering, peeling or loosening of the skin, including inside the mouth  signs and symptoms of high blood sugar such as dizziness; dry mouth; dry skin; fruity breath; nausea; stomach pain; increased hunger or thirst; increased urination  signs and symptoms of kidney injury like trouble passing urine or change in the amount of urine  signs and symptoms of liver injury like dark urine, light-colored stools, loss of appetite, nausea, right upper belly pain, yellowing of the eyes or skin  sweating  swollen lymph nodes  weight loss Side effects that usually do not require medical attention (report to your doctor or health care professional if they continue or are bothersome):  decreased appetite  hair loss  muscle pain  tiredness This list may not describe all possible side effects. Call your doctor for medical advice about side effects. You may report side effects to FDA at 1-800-FDA-1088. Where should I keep my medicine? This drug is given in a hospital or clinic and will not be stored at home. NOTE:  This sheet is a summary. It may not cover all possible information. If you have questions about this medicine, talk to your doctor, pharmacist, or health care provider.  2020 Elsevier/Gold Standard (2019-01-27 18:07:58)   Pemetrexed injection (Alimta) What is this medicine? PEMETREXED (PEM e TREX ed) is a chemotherapy drug used to treat lung cancers like non-small cell lung cancer and mesothelioma. It may also be used to treat other cancers. This medicine may be used for other purposes; ask your health care provider or pharmacist if you have questions. COMMON BRAND NAME(S): Alimta What should I tell my health care provider before I take this medicine? They need to know if you have any of these conditions:  infection (especially a virus infection such as chickenpox, cold sores, or herpes)  kidney disease  low blood counts, like low white cell, platelet, or red cell counts  lung or breathing disease, like asthma  radiation therapy  an unusual or allergic reaction to pemetrexed, other medicines, foods, dyes, or preservative  pregnant or trying to get pregnant  breast-feeding How should I use this medicine? This drug is given as an infusion into a vein. It is administered in a hospital or clinic by a specially trained health care professional. Talk to your pediatrician regarding the use of this medicine in children. Special  care may be needed. Overdosage: If you think you have taken too much of this medicine contact a poison control center or emergency room at once. NOTE: This medicine is only for you. Do not share this medicine with others. What if I miss a dose? It is important not to miss your dose. Call your doctor or health care professional if you are unable to keep an appointment. What may interact with this medicine? This medicine may interact with the following medications:  Ibuprofen This list may not describe all possible interactions. Give your health care provider a  list of all the medicines, herbs, non-prescription drugs, or dietary supplements you use. Also tell them if you smoke, drink alcohol, or use illegal drugs. Some items may interact with your medicine. What should I watch for while using this medicine? Visit your doctor for checks on your progress. This drug may make you feel generally unwell. This is not uncommon, as chemotherapy can affect healthy cells as well as cancer cells. Report any side effects. Continue your course of treatment even though you feel ill unless your doctor tells you to stop. In some cases, you may be given additional medicines to help with side effects. Follow all directions for their use. Call your doctor or health care professional for advice if you get a fever, chills or sore throat, or other symptoms of a cold or flu. Do not treat yourself. This drug decreases your body's ability to fight infections. Try to avoid being around people who are sick. This medicine may increase your risk to bruise or bleed. Call your doctor or health care professional if you notice any unusual bleeding. Be careful brushing and flossing your teeth or using a toothpick because you may get an infection or bleed more easily. If you have any dental work done, tell your dentist you are receiving this medicine. Avoid taking products that contain aspirin, acetaminophen, ibuprofen, naproxen, or ketoprofen unless instructed by your doctor. These medicines may hide a fever. Call your doctor or health care professional if you get diarrhea or mouth sores. Do not treat yourself. To protect your kidneys, drink water or other fluids as directed while you are taking this medicine. Do not become pregnant while taking this medicine or for 6 months after stopping it. Women should inform their doctor if they wish to become pregnant or think they might be pregnant. Men should not father a child while taking this medicine and for 3 months after stopping it. This may  interfere with the ability to father a child. You should talk to your doctor or health care professional if you are concerned about your fertility. There is a potential for serious side effects to an unborn child. Talk to your health care professional or pharmacist for more information. Do not breast-feed an infant while taking this medicine or for 1 week after stopping it. What side effects may I notice from receiving this medicine? Side effects that you should report to your doctor or health care professional as soon as possible:  allergic reactions like skin rash, itching or hives, swelling of the face, lips, or tongue  breathing problems  redness, blistering, peeling or loosening of the skin, including inside the mouth  signs and symptoms of bleeding such as bloody or black, tarry stools; red or dark-brown urine; spitting up blood or brown material that looks like coffee grounds; red spots on the skin; unusual bruising or bleeding from the eye, gums, or nose  signs and symptoms of  infection like fever or chills; cough; sore throat; pain or trouble passing urine  signs and symptoms of kidney injury like trouble passing urine or change in the amount of urine  signs and symptoms of liver injury like dark yellow or brown urine; general ill feeling or flu-like symptoms; light-colored stools; loss of appetite; nausea; right upper belly pain; unusually weak or tired; yellowing of the eyes or skin Side effects that usually do not require medical attention (report to your doctor or health care professional if they continue or are bothersome):  constipation  mouth sores  nausea, vomiting  unusually weak or tired This list may not describe all possible side effects. Call your doctor for medical advice about side effects. You may report side effects to FDA at 1-800-FDA-1088. Where should I keep my medicine? This drug is given in a hospital or clinic and will not be stored at home. NOTE: This  sheet is a summary. It may not cover all possible information. If you have questions about this medicine, talk to your doctor, pharmacist, or health care provider.  2020 Elsevier/Gold Standard (2017-05-12 16:11:33)   Carboplatin injection What is this medicine? CARBOPLATIN (KAR boe pla tin) is a chemotherapy drug. It targets fast dividing cells, like cancer cells, and causes these cells to die. This medicine is used to treat ovarian cancer and many other cancers. This medicine may be used for other purposes; ask your health care provider or pharmacist if you have questions. COMMON BRAND NAME(S): Paraplatin What should I tell my health care provider before I take this medicine? They need to know if you have any of these conditions:  blood disorders  hearing problems  kidney disease  recent or ongoing radiation therapy  an unusual or allergic reaction to carboplatin, cisplatin, other chemotherapy, other medicines, foods, dyes, or preservatives  pregnant or trying to get pregnant  breast-feeding How should I use this medicine? This drug is usually given as an infusion into a vein. It is administered in a hospital or clinic by a specially trained health care professional. Talk to your pediatrician regarding the use of this medicine in children. Special care may be needed. Overdosage: If you think you have taken too much of this medicine contact a poison control center or emergency room at once. NOTE: This medicine is only for you. Do not share this medicine with others. What if I miss a dose? It is important not to miss a dose. Call your doctor or health care professional if you are unable to keep an appointment. What may interact with this medicine?  medicines for seizures  medicines to increase blood counts like filgrastim, pegfilgrastim, sargramostim  some antibiotics like amikacin, gentamicin, neomycin, streptomycin, tobramycin  vaccines Talk to your doctor or health care  professional before taking any of these medicines:  acetaminophen  aspirin  ibuprofen  ketoprofen  naproxen This list may not describe all possible interactions. Give your health care provider a list of all the medicines, herbs, non-prescription drugs, or dietary supplements you use. Also tell them if you smoke, drink alcohol, or use illegal drugs. Some items may interact with your medicine. What should I watch for while using this medicine? Your condition will be monitored carefully while you are receiving this medicine. You will need important blood work done while you are taking this medicine. This drug may make you feel generally unwell. This is not uncommon, as chemotherapy can affect healthy cells as well as cancer cells. Report any side effects. Continue  your course of treatment even though you feel ill unless your doctor tells you to stop. In some cases, you may be given additional medicines to help with side effects. Follow all directions for their use. Call your doctor or health care professional for advice if you get a fever, chills or sore throat, or other symptoms of a cold or flu. Do not treat yourself. This drug decreases your body's ability to fight infections. Try to avoid being around people who are sick. This medicine may increase your risk to bruise or bleed. Call your doctor or health care professional if you notice any unusual bleeding. Be careful brushing and flossing your teeth or using a toothpick because you may get an infection or bleed more easily. If you have any dental work done, tell your dentist you are receiving this medicine. Avoid taking products that contain aspirin, acetaminophen, ibuprofen, naproxen, or ketoprofen unless instructed by your doctor. These medicines may hide a fever. Do not become pregnant while taking this medicine. Women should inform their doctor if they wish to become pregnant or think they might be pregnant. There is a potential for serious  side effects to an unborn child. Talk to your health care professional or pharmacist for more information. Do not breast-feed an infant while taking this medicine. What side effects may I notice from receiving this medicine? Side effects that you should report to your doctor or health care professional as soon as possible:  allergic reactions like skin rash, itching or hives, swelling of the face, lips, or tongue  signs of infection - fever or chills, cough, sore throat, pain or difficulty passing urine  signs of decreased platelets or bleeding - bruising, pinpoint red spots on the skin, black, tarry stools, nosebleeds  signs of decreased red blood cells - unusually weak or tired, fainting spells, lightheadedness  breathing problems  changes in hearing  changes in vision  chest pain  high blood pressure  low blood counts - This drug may decrease the number of white blood cells, red blood cells and platelets. You may be at increased risk for infections and bleeding.  nausea and vomiting  pain, swelling, redness or irritation at the injection site  pain, tingling, numbness in the hands or feet  problems with balance, talking, walking  trouble passing urine or change in the amount of urine Side effects that usually do not require medical attention (report to your doctor or health care professional if they continue or are bothersome):  hair loss  loss of appetite  metallic taste in the mouth or changes in taste This list may not describe all possible side effects. Call your doctor for medical advice about side effects. You may report side effects to FDA at 1-800-FDA-1088. Where should I keep my medicine? This drug is given in a hospital or clinic and will not be stored at home. NOTE: This sheet is a summary. It may not cover all possible information. If you have questions about this medicine, talk to your doctor, pharmacist, or health care provider.  2020 Elsevier/Gold  Standard (2007-06-28 14:38:05)

## 2019-12-15 ENCOUNTER — Telehealth: Payer: Self-pay | Admitting: Emergency Medicine

## 2019-12-15 MED ORDER — ALBUTEROL SULFATE HFA 108 (90 BASE) MCG/ACT IN AERS: 2.0000 | INHALATION_SPRAY | Freq: Four times a day (QID) | RESPIRATORY_TRACT | 6 refills | Status: DC | PRN

## 2019-12-15 NOTE — Telephone Encounter (Signed)
Spoke with patient, she stated that her pharmacy never got her albuterol inhaler prescription.  Advised I would send it to her pharmacy.  She asked is Dr. Eddie North office had gotten in touch with [place] regarding her sob with exertion and lying flat.  She states her sats dropped to 82-83% in their office and they put her on oxygen.  I asked if they ordered her oxygen and she stated no, they said it would take them too long to get, that they would get in touch with [place] so we could order it for her.  I advised her that she would have to be seen in the office and walk to qualify for the oxygen.  I scheduled her to see Rexene Edison NP on Wednesday 9/15 at 9:30 am.  I let her know I would make Dr. Lamonte Sakai know as well.

## 2019-12-15 NOTE — Telephone Encounter (Signed)
Thank you :)

## 2019-12-18 ENCOUNTER — Other Ambulatory Visit: Payer: Self-pay | Admitting: Radiation Oncology

## 2019-12-18 ENCOUNTER — Telehealth: Payer: Self-pay | Admitting: Physician Assistant

## 2019-12-18 ENCOUNTER — Other Ambulatory Visit: Payer: Self-pay | Admitting: Physician Assistant

## 2019-12-18 ENCOUNTER — Telehealth: Payer: Self-pay | Admitting: *Deleted

## 2019-12-18 DIAGNOSIS — R112 Nausea with vomiting, unspecified: Secondary | ICD-10-CM

## 2019-12-18 MED ORDER — ONDANSETRON 8 MG PO TBDP: 8.0000 mg | ORAL_TABLET | Freq: Three times a day (TID) | ORAL | 3 refills | Status: DC | PRN

## 2019-12-18 MED ORDER — LIDOCAINE VISCOUS HCL 2 % MT SOLN: 15.0000 mL | OROMUCOSAL | 1 refills | Status: DC | PRN

## 2019-12-18 NOTE — Telephone Encounter (Signed)
Patient called to report continued nausea/vomiting.  States she has to crush her medication and take with food either pudding or apple sauce.  She stated that she isn't even able to tolerate that now.  She does report that her liquid cough medication is more tolerable.  Asking if we have any antiemetic that could be prescribed in the liquid form or transdermal form.  Routed to MD to advise.

## 2019-12-18 NOTE — Telephone Encounter (Signed)
I called the patient regarding her request for an anti-emetic in liquid or transdermal form due to being unable to tolerate swallowing her pills. I let her know that I sent in a prescription for sublingual zofran every 8 hours PRN. I specifically discussed that his is the same medication that Dr. Sondra Come had prescribed 1 month ago in a dissolvable form and to make sure that she does not take duplicates of the medication. She expressed understanding.

## 2019-12-19 ENCOUNTER — Telehealth (HOSPITAL_COMMUNITY): Payer: Self-pay | Admitting: General Practice

## 2019-12-19 NOTE — Telephone Encounter (Signed)
[facility] CSW Progress Notes  Secure email received from [facility] confirming they have submitted patient's application for Social Security disability benefits.  Patient advised to keep in touch w [facility] during this process.  Edwyna Shell, LCSW Clinical Social Worker Phone:  [phone]

## 2019-12-20 ENCOUNTER — Other Ambulatory Visit: Payer: Self-pay

## 2019-12-20 ENCOUNTER — Ambulatory Visit: Payer: Managed Care, Other (non HMO) | Admitting: Adult Health

## 2019-12-20 ENCOUNTER — Encounter: Payer: Self-pay | Admitting: Adult Health

## 2019-12-20 VITALS — BP 100/60 | HR 100 | Temp 96.7°F | Ht 65.0 in | Wt 128.0 lb

## 2019-12-20 DIAGNOSIS — R1310 Dysphagia, unspecified: Secondary | ICD-10-CM | POA: Diagnosis not present

## 2019-12-20 DIAGNOSIS — C3490 Malignant neoplasm of unspecified part of unspecified bronchus or lung: Secondary | ICD-10-CM

## 2019-12-20 DIAGNOSIS — J438 Other emphysema: Secondary | ICD-10-CM

## 2019-12-20 DIAGNOSIS — C3491 Malignant neoplasm of unspecified part of right bronchus or lung: Secondary | ICD-10-CM | POA: Diagnosis not present

## 2019-12-20 DIAGNOSIS — R0600 Dyspnea, unspecified: Secondary | ICD-10-CM | POA: Diagnosis not present

## 2019-12-20 DIAGNOSIS — J439 Emphysema, unspecified: Secondary | ICD-10-CM | POA: Insufficient documentation

## 2019-12-20 DIAGNOSIS — T300 Burn of unspecified body region, unspecified degree: Secondary | ICD-10-CM

## 2019-12-20 NOTE — Assessment & Plan Note (Addendum)
Localized left lateral neck skin burn secondary to recent radiation treatment for metastatic lung cancer Area appears to be in stages of healing with no apparent localized infection.  Patient is to do localized skin care.  Apply Telfa skin pad if needed.  May use Vaseline to keep areas moist to help with healing.  Advised to look for signs of infection.  If the area is not healing will need further follow-up.  Patient is to contact radiation oncology and notify of current area of concern

## 2019-12-20 NOTE — Patient Instructions (Addendum)
Set up overnight oximetry test .  Activity as tolerated.  High protein diet, can try protein supplement/powder or protein shakes/fruit based .  Push fluids, such as Propel and water.  Albuterol inhaler As needed  Shortness of breath. - Keep skin clean and dry , cover with telfa nonstick bandage, use neosporin or vaseline to keep area moist.  Follow up in 2 months with Dr. Lamonte Sakai   and As needed   Follow up with oncology and radiation oncology as planned.

## 2019-12-20 NOTE — Progress Notes (Signed)
_0  ID: Elizabeth Mayer, female    DOB: [birth year], 59 y.o.   MRN: [id number]  Chief Complaint  Patient presents with  . Follow-up    lung cancer     Referring provider: Minette Brine, FNP  HPI: 59 year old female former smoker (20-pack-year) followed for metastatic lung cancer -stage IV most consistent with adenocarcinoma diagnosed November 01, 2019-presented initially with extensive cervical and mediastinal lymphadenopathy with right perihilar lung mass which encases and occludes the right lower lobe pulmonary artery  Followed by oncology and radiation oncology status post palliative radiotherapy to painful cervical adenopathy last dose December 04, 2019.  Undergoing palliative systemic chemotherapy with carboplatin, Alimta, and immunotherapy with Keytruda MRI brain positive for metastasis with upcoming consultation with radiation oncology   TEST/EVENTS :  CT chest October 17, 2019 right perihilar mass encases and occludes the right lower lobe pulmonary artery, also encasement of the right upper lobe right middle lobe and right lower lobe bronchi with evidence of postobstructive atelectasis of the right middle lobe and subsegmental atelectasis of the right lower lobe, pleural-based nodule in the paramediastinal right upper lobe and extensive bilateral mediastinal right hilar bilateral supraclavicular and right axillary adenopathy.  PET scan November 20, 2019 right hilar mass with extensive lower neck and thoracic adenopathy with local neck nodal involvement extending beyond the scalene and supraclavicular nodes constitutes extrathoracic metastatic disease stage IV, hypermetabolic with SUV 10.2  MRI brain December 05, 2019 for intraparenchymal metastasis with mild edema no midline shift or mass-effect.  12/20/2019 Follow up : Metastatic lung cancer Patient presents for a follow-up visit for recently diagnosed metastatic lung cancer.  As above patient presented with initial severe left-sided  cervical adenopathy.  Subsequent CT neck and chest showed right lung mass with evidence of metastasis.  She had biopsy of her cervical adenopathy on November 01, 2019 that showed metastatic carcinoma most consistent with lung adenocarcinoma.  PD-L1 expression 95%.  She has completed palliative radiation to the left cervical adenopathy.  In is beginning systemic chemo and immunotherapy with oncology as above.  She recently had an MRI of the brain that showed metastasis and has an upcoming consultation with radiation oncology.  Patient says she remains very weak.  Since radiation she has been having more difficulty swallowing with decreased appetite and weakness.  She does have some localized skin irritation and burned area along the left lateral neck from radiation.  No redness or drainage. She is concerned that her oxygen levels have been dropping mainly when she tries to lie down in the bed she has to prop herself up.  Today in the office walk test did not show any desaturations with O2 saturations maintaining above 90% with ambulation.  She says she has checked her oxygen levels when she tries to lie down and they can go down into the mid 80s.  We discussed setting up for an overnight oximetry test. She denies any increased cough or congestion.  No fever. She was prescribed an albuterol inhaler but has not picked this up yet. Patient weight continues to decline.  She says she has a very poor appetite.  We discussed a high-protein diet with protein supplements and electrolyte fluids.   Allergies  Allergen Reactions  . Pseudoephedrine Hypertension  . Gabapentin Other (See Comments)    Raise blood pressure and red rings around eyes Blood vessels popped in her eyes  . Mobic [Meloxicam] Swelling    Inflamed the area that has inflammation and stabbing pains in the area  .  Penicillins Hives    Immunization History  Administered Date(s) Administered  . Td 10/06/1994    Past Medical History:  Diagnosis  Date  . Anemia   . Angina 1982   related to stress  . Asthma    in the past, not now- never inhlaer use  . Cancer ([place])    lung  . Complication of anesthesia    states anesthesia made her hair fall out  . Constipation   . GERD (gastroesophageal reflux disease)   . Heart murmur 1970s  . Ingrown toenail   . Past heart attack 1980-1981   pt states she passed out and woke up in hospital- told she had heart attack, but then dr said he couldn't find anything wrong.  . Pneumonia     Tobacco History: Social History   Tobacco Use  Smoking Status Former Smoker  . Packs/day: 1.00  . Years: 20.00  . Pack years: 20.00  . Types: Cigarettes  . Start date: 15  . Quit date: 2001  . Years since quitting: 20.7  Smokeless Tobacco Never Used   Counseling given: Not Answered   Outpatient Medications Prior to Visit  Medication Sig Dispense Refill  . acetaminophen (TYLENOL) 500 MG tablet Take 1,000 mg by mouth every 6 (six) hours as needed for moderate pain.    Marland Kitchen albuterol (VENTOLIN HFA) 108 (90 Base) MCG/ACT inhaler Inhale 2 puffs into the lungs every 6 (six) hours as needed for wheezing or shortness of breath. 18 g 6  . Artificial Saliva (THERABREATH DRY MOUTH) LOZG Use as directed 1 drop in the mouth or throat as needed (dry mouth).    . Carboxymethylcellul-Glycerin (LUBRICATING EYE DROPS OP) Place 1 drop into both eyes daily as needed (irritation).    . Cream Base (WOUND CARE) CREA Apply 1 application topically as needed (wound care).    . cyclobenzaprine (FLEXERIL) 10 MG tablet Take 10 mg by mouth 3 (three) times daily as needed for muscle spasms.    Marland Kitchen DIGESTIVE ENZYMES PO Take 0.5-1 tablets by mouth 3 (three) times daily before meals.    . fexofenadine (ALLEGRA) 180 MG tablet Take 180 mg by mouth at bedtime.    . fluconazole (DIFLUCAN) 100 MG tablet Take 1 tablet (100 mg total) by mouth daily. 7 tablet 0  . fluticasone (FLONASE) 50 MCG/ACT nasal spray Place 2 sprays into both nostrils  daily.    . folic acid (FOLVITE) 1 MG tablet Take 1 tablet (1 mg total) by mouth daily. 30 tablet 4  . guaiFENesin (MUCINEX) 600 MG 12 hr tablet Take 600 mg by mouth 2 (two) times daily. (0800 & 2000)    . HYDROcodone-acetaminophen (HYCET) 7.5-325 mg/15 ml solution Take 10 mLs by mouth 4 (four) times daily as needed for moderate pain. For pain from cancer and radiation reactions 473 mL 0  . HYDROcodone-acetaminophen (NORCO/VICODIN) 5-325 MG tablet Take 1-2 tablets by mouth every 6 (six) hours as needed for moderate pain. 60 tablet 0  . lidocaine (XYLOCAINE) 2 % solution Use as directed 15 mLs in the mouth or throat as needed for mouth pain. Swallow 30 min prior to meals and at bedtime for throat/esophagus pain 250 mL 1  . ondansetron (ZOFRAN ODT) 8 MG disintegrating tablet Take 1 tablet (8 mg total) by mouth every 8 (eight) hours as needed for nausea or vomiting. 30 tablet 3  . ondansetron (ZOFRAN) 4 MG tablet Take 1 tablet (4 mg total) by mouth every 8 (eight) hours as needed for  nausea or vomiting. 20 tablet 0  . orphenadrine (NORFLEX) 100 MG tablet Take 1 tablet (100 mg total) by mouth 2 (two) times daily as needed for muscle spasms. 60 tablet 1  . potassium chloride SA (KLOR-CON) 20 MEQ tablet Take 1 tablet (20 mEq total) by mouth daily. 5 tablet 0  . prochlorperazine (COMPAZINE) 10 MG tablet Take 1 tablet (10 mg total) by mouth every 6 (six) hours as needed. (Patient taking differently: Take 10 mg by mouth every 6 (six) hours as needed for nausea or vomiting. ) 30 tablet 2   No facility-administered medications prior to visit.     Review of Systems:   Constitutional:  +  weight loss, no night sweats,  Fevers, chills +, fatigue, or  lassitude.  HEENT:   No headaches, + Difficulty swallowing,  No Tooth/dental problems, or  Sore throat,                No sneezing, itching, ear ache, nasal congestion, post nasal drip,   CV:  No chest pain,  Orthopnea, PND, swelling in lower extremities,  anasarca, dizziness, palpitations, syncope.   GI + loss of appetite and weight loss Resp:  .  No chest wall deformity  Skin: no rash or lesions.  GU: no dysuria, change in color of urine, no urgency or frequency.  No flank pain, no hematuria   MS:  No joint pain or swelling.  No decreased range of motion.  No back pain.    Physical Exam  BP 100/60 (BP Location: Left Arm, Cuff Size: Normal)   Pulse 100   Temp (!) 96.7 F (35.9 C) (Temporal)   Ht _0  (1.651 m)   Wt 128 lb (58.1 kg)   SpO2 100% Comment: RA  BMI 21.30 kg/m   GEN: A/Ox3; pleasant , NAD, w thin frail female   HEENT:  [place]/AT, , NOSE-clear, THROAT-clear, no lesions, no postnasal drip or exudate noted.  Positive left eye ptosis  NECK:  Supple w/ fair ROM; no JVD; normal carotid impulses w/o bruits; positive left large lateral cervical adenopathy  RESP  Clear  P & A; w/o, wheezes/ rales/ or rhonchi. no accessory muscle use, no dullness to percussion  CARD:  RRR, no m/r/g, no peripheral edema, pulses intact, no cyanosis or clubbing.  GI:   Soft & nt; nml bowel sounds; no organomegaly or masses detected.   Musco: Warm bil, no deformities or joint swelling noted.   Neuro: alert, no focal deficits noted.    Skin: Warm, stage II localized skin burn with partial healing no significant redness.  Along the left lateral neck    Lab Results:   BMET  BNP No results found for: BNP  ProBNP No results found for: PROBNP  Imaging: DG Chest 2 View  Result Date: 11/28/2019 CLINICAL DATA:  Shortness of breath. Adenoma squamous carcinoma of the lung. EXAM: CHEST - 2 VIEW COMPARISON:  CT chest 10/17/2019 FINDINGS: Heart size appears within normal limits. There is complete atelectasis of the right middle lobe secondary to previously identified right perihilar lung mass. Subsegmental atelectasis involving the left lower lobe is similar to previous chest CT. No superimposed airspace consolidation identified. IMPRESSION: 1.  Unchanged appearance of complete right middle lobe atelectasis and subsegmental atelectasis of the left lower lobe. No acute superimposed cardiopulmonary abnormalities noted. Electronically Signed   By: Kerby Moors M.D.   On: 11/28/2019 10:07   MR Brain W Wo Contrast  Result Date: 12/06/2019 CLINICAL DATA:  Non-small  cell lung carcinoma staging EXAM: MRI HEAD WITHOUT AND WITH CONTRAST TECHNIQUE: Multiplanar, multiecho pulse sequences of the brain and surrounding structures were obtained without and with intravenous contrast. CONTRAST:  5m GADAVIST GADOBUTROL 1 MMOL/ML IV SOLN COMPARISON:  None. FINDINGS: Brain: No acute infarct, acute hemorrhage or extra-axial collection. Normal white matter signal. Normal volume of CSF spaces. No chronic microhemorrhage. Normal midline structures. There are for contrast-enhancing lesions: 1. Posterior right parietal lobe with mild adjacent edema, 8 mm, series 10, image 34. There is serpiginous enhancement extending inferiorly from this lesion. 2. Right precentral gyrus, 2 mm, image 38 3. Right medial frontal gyrus, 8 mm, image 40 4. Left superior frontal gyrus, 5 mm, image 41 Vascular: Normal flow voids. Skull and upper cervical spine: Normal marrow signal. Sinuses/Orbits: Negative. Other: None IMPRESSION: Four intraparenchymal supratentorial metastases with mild edema. No midline shift or other significant mass effect. Electronically Signed   By: KUlyses JarredM.D.   On: 12/06/2019 02:36   NM PET Image Initial (PI) Skull Base To Thigh  Result Date: 11/20/2019 CLINICAL DATA:  Initial treatment strategy for non-small cell lung cancer. EXAM: NUCLEAR MEDICINE PET SKULL BASE TO THIGH TECHNIQUE: 7.2 mCi F-18 FDG was injected intravenously. Full-ring PET imaging was performed from the skull base to thigh after the radiotracer. CT data was obtained and used for attenuation correction and anatomic localization. Fasting blood glucose: 110 mg/dl COMPARISON:  Multiple exams,  including CT chest from 10/17/2019 FINDINGS: Mediastinal blood pool activity: SUV max 2.1 Liver activity: SUV max N/A NECK: Hypermetabolic left level III, bilateral level V, and bilateral level IV adenopathy in the lower neck. Dominant left level III lymph node measures 3.6 cm in short axis has central necrosis and a maximum SUV of 21.9. Incidental CT findings: none CHEST: Bilateral supraclavicular, left subpectoral, and right axillary adenopathy noted with infiltrative conglomerate prevascular, paratracheal, and AP window adenopathy along with individually hypermetabolic prevascular, right hilar, left hilar, subcarinal, and distal paraesophageal lymph nodes. Index right axillary node 2.4 cm in short axis with maximum SUV 14.1. Index conglomerate prevascular and paratracheal adenopathy maximum SUV 19.3. Right hilar mass more closely associated with the right lower lobe and right middle lobe, maximum SUV 17.3, measuring 3.9 cm in long axis. Tumor along the right pleural-pericardial margin medially measuring up to 1.5 cm in thickness on image 68/4, maximum SUV 17.1 Incidental CT findings: Centrilobular emphysema. Right-sided airway thickening with some scattered volume loss in the right upper lobe, right middle lobe, and superior segment right lower lobe. Trace left pleural effusion, nonspecific for transudative or exudative etiology. ABDOMEN/PELVIS: The left adrenal nodule measuring about 0.8 by 1.2 cm on the prior CT chest has an internal density of 16 Hounsfield units (indeterminate) but a maximum SUV of only about 3.5 which is indeterminate. There is faintly accentuated activity in a 0.5 cm right gastric lymph node with maximum SUV of 3.4. Incidental CT findings: Aortoiliac atherosclerotic vascular disease. SKELETON: No significant abnormal hypermetabolic activity in this region. Incidental CT findings: Degenerative disc disease and degenerative endplate sclerosis at LT3-M4 IMPRESSION: 1. Right hilar mass with  extensive lower neck and thoracic adenopathy. The lower neck nodal involvement extends beyond scalene/supraclavicular nodes accordingly constitutes extra thoracic metastatic disease. Appearance compatible with T3 N3 M1 disease (stage IV). 2. The left adrenal nodule is not overtly hypermetabolic but only measures 0.8 by 1.2 cm. This nodule was not present on the CT abdomen from 10/06/2017 and has a density of 16 Hounsfield units precontrast which is indeterminate.  Surveillance recommended. 3.  Aortic Atherosclerosis (ICD10-I70.0). 4. Trace left pleural effusion. Electronically Signed   By: Van Clines M.D.   On: 11/20/2019 16:47    acetaminophen (TYLENOL) tablet 325 mg    Date Action Dose Route User   12/14/2019 0918 Given  Oral Levada Schilling, RN    CARBOplatin (PARAPLATIN) 480 mg in sodium chloride 0.9 % 250 mL chemo infusion    Date Action Dose Route User   12/14/2019 1149 Restarted  (none) Levada Schilling, RN   12/14/2019 1112 Rate/Dose Verify  (none) Levada Schilling, RN   12/14/2019 1111 Rate/Dose Verify  (none) Levada Schilling, RN   12/14/2019 1111 New Bag/Given  Intravenous Levada Schilling, RN    cyanocobalamin ((VITAMIN B-12)) injection 1,000 mcg    Date Action Dose Route User   12/07/2019 1034 Given  Intramuscular (Right Deltoid) [place] R[place] A, LPN    dexamethasone (DECADRON) 10 mg in sodium chloride 0.9 % 50 mL IVPB    Date Action Dose Route User   12/14/2019 0906 Rate/Dose Verify  (none) Levada Schilling, RN   12/14/2019 4709 New Bag/Given  Intravenous Levada Schilling, RN    fosaprepitant (EMEND) 150 mg in sodium chloride 0.9 % 145 mL IVPB    Date Action Dose Route User   12/14/2019 0955 Rate/Dose Verify  (none) Levada Schilling, RN   12/14/2019 [phone] Rate/Dose Verify  (none) Levada Schilling, RN   12/14/2019 [phone] Rate/Dose Verify  (none) Levada Schilling, RN   12/14/2019 0932 New Bag/Given  Intravenous Levada Schilling, RN    oxyCODONE (Oxy IR/ROXICODONE) immediate release tablet  5 mg    Date Action Dose Route User   12/14/2019 0918 Given  Oral Levada Schilling, RN    palonosetron (ALOXI) injection 0.25 mg    Date Action Dose Route User   12/14/2019 0905 Given  Intravenous Levada Schilling, RN    pembrolizumab [facility]) 200 mg in sodium chloride 0.9 % 50 mL chemo infusion    Date Action Dose Route User   12/14/2019 1003 Rate/Dose Verify  (none) Levada Schilling, RN   12/14/2019 1002 New Bag/Given  Intravenous Levada Schilling, RN    PEMEtrexed (ALIMTA) 800 mg in sodium chloride 0.9 % 100 mL chemo infusion    Date Action Dose Route User   12/14/2019 1055 Rate/Dose Verify  (none) Levada Schilling, RN   12/14/2019 1055 New Bag/Given  Intravenous Levada Schilling, RN    0.9 %  sodium chloride infusion    Date Action Dose Route User   12/14/2019 1156 Rate/Dose Change  (none) Levada Schilling, RN   12/14/2019 1152 Rate/Dose Change  (none) Levada Schilling, RN   12/14/2019 1152 Restarted  (none) Levada Schilling, RN   12/14/2019 1143 Restarted  (none) Levada Schilling, RN   12/14/2019 1109 Rate/Dose Verify  (none) Levada Schilling, RN      No flowsheet data found.  No results found for: NITRICOXIDE      Assessment & Plan:   Non-small cell lung cancer, right [facility]) Continue follow-up with oncology and treatment plan  Dyspnea Dyspnea suspect is multifactorial in nature.  Patient has seen some nocturnal desaturations in supine position suspect is related to her extensive disease.  We will check an overnight oximetry.  Patient does not have ambulatory desaturations if overnight oximetry test shows hypoxia will begin nocturnal oxygen.   Skin burn Localized left lateral neck skin burn secondary to recent radiation treatment for metastatic lung cancer Area appears to be in stages of healing with no apparent localized infection.  Patient is to do localized skin care.  Apply Telfa skin pad if needed.  May use Vaseline to keep areas moist to help with healing.  Advised to look for signs of  infection.  If the area is not healing will need further follow-up.  Patient is to contact radiation oncology and notify of current area of concern  Dysphagia Anorexia and weight loss while undergoing treatment for lung cancer.  Patient is encouraged on high-protein diet, protein supplements and electrolyte replacement along with fluids.  Emphysema lung ([place]) Patient may use albuterol as needed.  Activity as tolerated  Plan  Patient Instructions  Set up overnight oximetry test .  Activity as tolerated.  High protein diet, can try protein supplement/powder or protein shakes/fruit based .  Push fluids, such as Propel and water.  Albuterol inhaler As needed  Shortness of breath. - Keep skin clean and dry , cover with telfa nonstick bandage, use neosporin or vaseline to keep area moist.  Follow up in 2 months and As needed   Follow up with oncology and radiation oncology as planned.         Rexene Edison, NP 12/20/2019

## 2019-12-20 NOTE — Assessment & Plan Note (Signed)
Anorexia and weight loss while undergoing treatment for lung cancer.  Patient is encouraged on high-protein diet, protein supplements and electrolyte replacement along with fluids.

## 2019-12-20 NOTE — Assessment & Plan Note (Signed)
Continue follow-up with oncology and treatment plan

## 2019-12-20 NOTE — Assessment & Plan Note (Signed)
Dyspnea suspect is multifactorial in nature.  Patient has seen some nocturnal desaturations in supine position suspect is related to her extensive disease.  We will check an overnight oximetry.  Patient does not have ambulatory desaturations if overnight oximetry test shows hypoxia will begin nocturnal oxygen.

## 2019-12-20 NOTE — Assessment & Plan Note (Signed)
Patient may use albuterol as needed.  Activity as tolerated  Plan  Patient Instructions  Set up overnight oximetry test .  Activity as tolerated.  High protein diet, can try protein supplement/powder or protein shakes/fruit based .  Push fluids, such as Propel and water.  Albuterol inhaler As needed  Shortness of breath. - Keep skin clean and dry , cover with telfa nonstick bandage, use neosporin or vaseline to keep area moist.  Follow up in 2 months and As needed   Follow up with oncology and radiation oncology as planned.

## 2019-12-23 ENCOUNTER — Ambulatory Visit (HOSPITAL_COMMUNITY): Payer: Managed Care, Other (non HMO)

## 2019-12-25 ENCOUNTER — Other Ambulatory Visit: Payer: Self-pay | Admitting: Physician Assistant

## 2019-12-25 ENCOUNTER — Telehealth: Payer: Self-pay | Admitting: Physician Assistant

## 2019-12-25 DIAGNOSIS — R1310 Dysphagia, unspecified: Secondary | ICD-10-CM

## 2019-12-25 MED ORDER — SUCRALFATE 1 G PO TABS: 1.0000 g | ORAL_TABLET | Freq: Three times a day (TID) | ORAL | 2 refills | Status: DC

## 2019-12-25 NOTE — Telephone Encounter (Signed)
Scheduled appts per los and sch msg. Called and spoke with patient multiple times today. Patient seems confused about appts and treatment. Reached out to nurse and provider. They will give her a call to clear things up.. patient still confirmed he appts that have been scheduled

## 2019-12-25 NOTE — Telephone Encounter (Signed)
I received a message that the patient was confused about her schedule and her treatment. I tried to call her to clarify but was unable to reach her. I encouraged her to call us back to discuss her schedule. Pending a call back.

## 2019-12-25 NOTE — Telephone Encounter (Signed)
I called the patient after receiving a message that she had some confusion about her appointments. She still is endorsing dysphagia. Her appointment with GI is not until 10/15. She completed radiation a few weeks ago. She had evidence of thrush on exam and completed diflucan. I have sent her Carafate to her pharmacy to see if that helps her swallowing and have placed a referral to nutrition to see if they can see her on 01/04/20 because she is concerned about her weight loss and difficulty swallowing certain foods.

## 2019-12-26 ENCOUNTER — Ambulatory Visit (HOSPITAL_COMMUNITY): Payer: Managed Care, Other (non HMO)

## 2019-12-27 ENCOUNTER — Ambulatory Visit: Payer: Managed Care, Other (non HMO)

## 2019-12-27 ENCOUNTER — Ambulatory Visit: Payer: Managed Care, Other (non HMO) | Admitting: Radiation Oncology

## 2019-12-27 NOTE — Progress Notes (Incomplete)
  Patient Name: Elizabeth Mayer MRN: [id number] DOB: [birth year] Referring Physician: Roosevelt Locks Date of Service: 12/06/2019 [facility], [place]                                                        End Of Treatment Note  Diagnoses: C34.91-Malignant neoplasm of unspecified part of right bronchus or lung  Cancer Staging: Metastatic non-small cell right lung cancer, adenocarcinoma vs adenosquamous carcinoma  Intent: Palliative  Radiation Treatment Dates: 11/15/2019 through 12/06/2019 Site Technique Total Dose (Gy) Dose per Fx (Gy) Completed Fx Beam Energies  Lung, Right: Lung_Rt 3D 35/35 2.5 14/14 6X, 10X, 15X   Narrative: The patient tolerated radiation therapy relatively well. During the first week of treatment, she reported nausea controlled with Zofran, difficulty swallowing, shortness of breath with exertion, left-sided neck pain, and black stools. On examination, she was noted to have mildly decreased breath sounds in the right lung field. As treatment continued, she reported a worsening sore throat, moderate fatigue, a productive cough with clear sputum, increased shortness of breath when laying flat, and painful/diffuculty swallowing. She denied hemoptysis. She was noted to have developed some redness and hyperpigmentation on the left side of her neck. At the end of treatment, the lymph nodes in the lower neck and supraclavicular areas bilaterally had decreased in size. There was no skin breakdown and her lungs were clear. Given her significant pharyngitis/esophageal symptoms, she was prescribed Viscous Xylocaine to use prior to meals and at bedtime. She was also to continue Hydrocodone.  PET scan on 11/20/2019 showed a right hilar mass with extensive lower neck and thoracic adenopathy. The lower neck nodal  involvement extended beyond scalene/supraclavicular nodes, constituting extra thoracic metastatic disease. Appearance was compatible with T3N3M1 disease (stage IV). It also showed a left adrenal nodule that was not overtly hypermetabolic and only measured 0.8 x 1.2 cm. Surveillence was recommended.   MRI of brain on 12/05/2019 showed four intraparenchymal supratentorial metastases with mild edema. No other midline shift or other significant mass effect. The patient has been set up for [facility] evaluation.  Plan: The patient will follow-up with radiation oncology in one month.  ________________________________________________   Blair Promise, PhD, MD  This document serves as a record of services personally performed by Gery Pray, MD. It was created on his behalf by Clerance Lav, a trained medical scribe. The creation of this record is based on the scribe's personal observations and the provider's statements to them. This document has been checked and approved by the attending provider.

## 2019-12-28 ENCOUNTER — Inpatient Hospital Stay: Payer: Managed Care, Other (non HMO)

## 2019-12-28 ENCOUNTER — Other Ambulatory Visit: Payer: Self-pay

## 2019-12-28 DIAGNOSIS — C3491 Malignant neoplasm of unspecified part of right bronchus or lung: Secondary | ICD-10-CM | POA: Diagnosis not present

## 2019-12-28 LAB — CMP (CANCER CENTER ONLY)
ALT: 17 U/L (ref 0–44)
AST: 20 U/L (ref 15–41)
Albumin: 3 g/dL — ABNORMAL LOW (ref 3.5–5.0)
Alkaline Phosphatase: 46 U/L (ref 38–126)
Anion gap: 6 (ref 5–15)
BUN: 10 mg/dL (ref 8–23)
CO2: 34 mmol/L — ABNORMAL HIGH (ref 22–32)
Calcium: 9.1 mg/dL (ref 8.9–10.3)
Chloride: 96 mmol/L — ABNORMAL LOW (ref 98–111)
Creatinine: 0.75 mg/dL (ref 0.44–1.00)
GFR, Est AFR Am: >60 mL/min (ref >60)
GFR, Estimated: >60 mL/min (ref >60)
Glucose, Bld: 114 mg/dL — ABNORMAL HIGH (ref 70–99)
Potassium: 3.3 mmol/L — ABNORMAL LOW (ref 3.5–5.1)
Sodium: 136 mmol/L (ref 135–145)
Total Bilirubin: 0.3 mg/dL (ref 0.3–1.2)
Total Protein: 6.8 g/dL (ref 6.5–8.1)

## 2019-12-28 LAB — CBC WITH DIFFERENTIAL (CANCER CENTER ONLY)
Abs Immature Granulocytes: 0.01 10*3/uL (ref 0.00–0.07)
Basophils Absolute: 0 10*3/uL (ref 0.0–0.1)
Basophils Relative: 0 %
Eosinophils Absolute: 0.2 10*3/uL (ref 0.0–0.5)
Eosinophils Relative: 7 %
HCT: 35.3 % — ABNORMAL LOW (ref 36.0–46.0)
Hemoglobin: 11.5 g/dL — ABNORMAL LOW (ref 12.0–15.0)
Immature Granulocytes: 0 %
Lymphocytes Relative: 15 %
Lymphs Abs: 0.4 10*3/uL — ABNORMAL LOW (ref 0.7–4.0)
MCH: 28.5 pg (ref 26.0–34.0)
MCHC: 32.6 g/dL (ref 30.0–36.0)
MCV: 87.6 fL (ref 80.0–100.0)
Monocytes Absolute: 0.4 10*3/uL (ref 0.1–1.0)
Monocytes Relative: 19 %
Neutro Abs: 1.3 10*3/uL — ABNORMAL LOW (ref 1.7–7.7)
Neutrophils Relative %: 59 %
Platelet Count: 82 10*3/uL — ABNORMAL LOW (ref 150–400)
RBC: 4.03 MIL/uL (ref 3.87–5.11)
RDW: 11.9 % (ref 11.5–15.5)
WBC Count: 2.3 10*3/uL — ABNORMAL LOW (ref 4.0–10.5)
nRBC: 0 % (ref 0.0–0.2)

## 2019-12-28 LAB — TSH: TSH: 1.289 u[IU]/mL (ref 0.308–3.960)

## 2019-12-29 ENCOUNTER — Encounter (HOSPITAL_COMMUNITY): Payer: Self-pay | Admitting: *Deleted

## 2019-12-29 ENCOUNTER — Other Ambulatory Visit: Payer: Self-pay

## 2019-12-29 NOTE — Progress Notes (Signed)
Spoke with pt for pre-op call. I spoke with her less than a month ago for a pre-op call. She states nothing has changed with her medical and surgical history. She denies any recent chest pain or shortness breath.   Covid test scheduled for 12/30/19 and pt understands that she needs to quarantine after the test is done and until she comes to the hospital on Tuesday.

## 2019-12-30 ENCOUNTER — Inpatient Hospital Stay (HOSPITAL_COMMUNITY): Admission: RE | Admit: 2019-12-30 | Payer: Managed Care, Other (non HMO) | Source: Ambulatory Visit

## 2020-01-01 ENCOUNTER — Other Ambulatory Visit (HOSPITAL_COMMUNITY)
Admission: RE | Admit: 2020-01-01 | Discharge: 2020-01-01 | Disposition: A | Discharge status: Home / Self Care | Payer: Managed Care, Other (non HMO) | Source: Ambulatory Visit | Attending: Radiation Oncology | Admitting: Radiation Oncology

## 2020-01-01 DIAGNOSIS — Z01812 Encounter for preprocedural laboratory examination: Secondary | ICD-10-CM | POA: Insufficient documentation

## 2020-01-01 DIAGNOSIS — Z20822 Contact with and (suspected) exposure to covid-19: Secondary | ICD-10-CM | POA: Diagnosis not present

## 2020-01-01 LAB — SARS CORONAVIRUS 2 (TAT 6-24 HRS): SARS Coronavirus 2: NEGATIVE

## 2020-01-02 ENCOUNTER — Ambulatory Visit (HOSPITAL_COMMUNITY)
Admission: RE | Admit: 2020-01-02 | Discharge: 2020-01-02 | Disposition: A | Discharge status: Home / Self Care | Payer: Managed Care, Other (non HMO) | Source: Ambulatory Visit | Attending: Radiation Oncology | Admitting: Radiation Oncology

## 2020-01-02 ENCOUNTER — Ambulatory Visit (HOSPITAL_COMMUNITY)
Admission: RE | Admit: 2020-01-02 | Discharge: 2020-01-02 | Disposition: A | Discharge status: Home / Self Care | Payer: Managed Care, Other (non HMO) | Attending: Radiation Oncology | Admitting: Radiation Oncology

## 2020-01-02 ENCOUNTER — Ambulatory Visit (HOSPITAL_COMMUNITY): Payer: Managed Care, Other (non HMO) | Admitting: Certified Registered"

## 2020-01-02 ENCOUNTER — Other Ambulatory Visit: Payer: Self-pay

## 2020-01-02 ENCOUNTER — Ambulatory Visit: Payer: Managed Care, Other (non HMO) | Admitting: Radiation Oncology

## 2020-01-02 ENCOUNTER — Encounter (HOSPITAL_COMMUNITY): Payer: Self-pay

## 2020-01-02 ENCOUNTER — Encounter (HOSPITAL_COMMUNITY): Admission: RE | Disposition: A | Discharge status: Home / Self Care | Payer: Self-pay | Source: Home / Self Care

## 2020-01-02 DIAGNOSIS — C7931 Secondary malignant neoplasm of brain: Secondary | ICD-10-CM

## 2020-01-02 DIAGNOSIS — C801 Malignant (primary) neoplasm, unspecified: Secondary | ICD-10-CM | POA: Insufficient documentation

## 2020-01-02 HISTORY — PX: RADIOLOGY WITH ANESTHESIA: SHX6223

## 2020-01-02 SURGERY — MRI WITH ANESTHESIA
Anesthesia: General

## 2020-01-02 MED ORDER — FENTANYL CITRATE (PF) 100 MCG/2ML IJ SOLN
INTRAMUSCULAR | Status: DC | PRN
Start: 2020-01-02 — End: 2020-01-02
  Administered 2020-01-02: 50 ug via INTRAVENOUS

## 2020-01-02 MED ORDER — PROPOFOL 10 MG/ML IV BOLUS
INTRAVENOUS | Status: DC | PRN
  Administered 2020-01-02: 140 mg via INTRAVENOUS

## 2020-01-02 MED ORDER — PHENYLEPHRINE 40 MCG/ML (10ML) SYRINGE FOR IV PUSH (FOR BLOOD PRESSURE SUPPORT)
PREFILLED_SYRINGE | INTRAVENOUS | Status: DC | PRN
  Administered 2020-01-02 (×2): 120 ug via INTRAVENOUS
  Administered 2020-01-02: 160 ug via INTRAVENOUS

## 2020-01-02 MED ORDER — PHENYLEPHRINE HCL-NACL 10-0.9 MG/250ML-% IV SOLN
INTRAVENOUS | Status: DC | PRN
  Administered 2020-01-02: 20 ug/min via INTRAVENOUS

## 2020-01-02 MED ORDER — SUGAMMADEX SODIUM 200 MG/2ML IV SOLN
INTRAVENOUS | Status: DC | PRN
  Administered 2020-01-02: 120 mg via INTRAVENOUS

## 2020-01-02 MED ORDER — ROCURONIUM BROMIDE 10 MG/ML (PF) SYRINGE
PREFILLED_SYRINGE | INTRAVENOUS | Status: DC | PRN
  Administered 2020-01-02: 50 mg via INTRAVENOUS

## 2020-01-02 MED ORDER — ONDANSETRON HCL 4 MG/2ML IJ SOLN
INTRAMUSCULAR | Status: DC | PRN
  Administered 2020-01-02: 4 mg via INTRAVENOUS

## 2020-01-02 MED ORDER — DEXAMETHASONE SODIUM PHOSPHATE 10 MG/ML IJ SOLN
INTRAMUSCULAR | Status: DC | PRN
  Administered 2020-01-02: 5 mg via INTRAVENOUS

## 2020-01-02 MED ORDER — LIDOCAINE 2% (20 MG/ML) 5 ML SYRINGE
INTRAMUSCULAR | Status: DC | PRN
  Administered 2020-01-02: 50 mg via INTRAVENOUS

## 2020-01-02 MED ORDER — GADOBUTROL 1 MMOL/ML IV SOLN
5.0000 mL | Freq: Once | INTRAVENOUS | Status: AC | PRN
  Administered 2020-01-02: 5 mL via INTRAVENOUS

## 2020-01-02 MED ORDER — EPHEDRINE SULFATE-NACL 50-0.9 MG/10ML-% IV SOSY
PREFILLED_SYRINGE | INTRAVENOUS | Status: DC | PRN
  Administered 2020-01-02: 15 mg via INTRAVENOUS
  Administered 2020-01-02: 10 mg via INTRAVENOUS

## 2020-01-02 MED ORDER — LACTATED RINGERS IV SOLN: INTRAVENOUS | Status: DC | PRN

## 2020-01-02 NOTE — Transfer of Care (Signed)
Immediate Anesthesia Transfer of Care Note  Patient: Elizabeth Mayer  Procedure(s) Performed: MRI BRAIN WITH AND WITHOUT CONTRAST (N/A )  Patient Location: PACU  Anesthesia Type:General  Level of Consciousness: drowsy and patient cooperative  Airway & Oxygen Therapy: Patient Spontanous Breathing  Post-op Assessment: Report given to RN  Post vital signs: Reviewed and stable  Last Vitals:  Vitals Value Taken Time  BP 96/58 01/02/20 1054  Temp    Pulse 77 01/02/20 1058  Resp 24 01/02/20 1058  SpO2 97 % 01/02/20 1058  Vitals shown include unvalidated device data.  Last Pain:  Vitals:   01/02/20 1055  TempSrc:   PainSc: (P) Asleep         Complications: No complications documented.

## 2020-01-02 NOTE — Progress Notes (Signed)
Location/Histology of Brain Tumor:  Stage IV non-small cell lung cancer, most consistent with adenocarcinoma  MRI Brain 01/02/2020 Multiple enhancing lesions are seen scattered throughout the brain parenchyma, consistent with metastatic disease to the brain. Some of the smaller lesions may have not been identified on prior MRI due to differences in technique. Lesions described prior (series 9): 1. Posterior right parietal lobe with leptomeningeal extension, 9 mm (8 mm on prior), image 97. Leptomeningeal extension better appreciated on series 10, image 11. 2. Right precentral gyrus, 2 mm (75mm on prior), image 117. 3. Right superior frontal gyrus, 9 mm, (8 mm on prior), image 123. 4. Left superior frontal gyrus, 9 mm, (5 mm on prior), image 132. Additional lesions (series 9): 1. Right pre frontal gyrus, 3 mm, image 130. 2. Right temporal lobe, 7 mm, image 64. 3. Left paracentral lobule, 5 mm, image 112. 4. At least 10 punctate (1-2 mm) enhancing lesions are seen scattered in the bilateral cerebral hemispheres. IMPRESSION: 1. Multiple enhancing lesions scattered throughout the brain parenchyma, consistent with metastatic disease to the brain with multiple new lesions identified in the current study. 2. Leptomeningeal extension of disease appreciated in at least one Lesion.  Past or anticipated interventions, if any, per neurosurgery:  No referrals placed at this time  Past or anticipated interventions, if any, per medical oncology:  Under care of Dr. Curt Bears -Palliative systemic chemotherapy with carboplatin for an AUC of 5, Alimta 500 mg/m2, Keytruda 200 mg IV every 3 weeks.  First dose given on 12/14/2019.  -Regarding the patient's dysphagia, she had evidence of thrush on her exam today.  I will send a prescription for Diflucan 100 mg p.o. daily to her pharmacy.  Additionally I will send a referral to gastroenterology for evaluation of her dysphagia and to assess to see if she has a  stricture or other cause of her dysphagia  Dose of Decadron, if applicable: No home prescription, but receives 10mg  IV during chemotherapy/inmmunotherapy treatments.  Recent neurologic symptoms, if any:   Seizures: None  Headaches: None  Nausea: Yes--she takes disintegrating Zofran to manage  Dizziness/ataxia: At baseline no, but occasionally she feels a little dizzy/off balance when she takes her pain medication  Difficulty with hand coordination: None  Focal numbness/weakness: None  Visual deficits/changes: Reports her prescription eye glasses are no longer effective; continues to have ptosis of her left eye  Confusion/Memory deficits: Expressive aphasia (first noticed around March 2021)  SAFETY ISSUES:  Prior radiation? Yes: Dr. Gery Pray   11/15/2019 through 12/06/2019   Pacemaker/ICD? No  Possible current pregnancy? No  Is the patient on methotrexate? No  Additional Complaints / other details:  Continued throat pain from previous radiation, and difficulty swallowing. She is scheduled for a GI consult on 01/19/2020. Residual right axillary, bilateral cervical and supraclavicular adenopathy

## 2020-01-02 NOTE — Anesthesia Preprocedure Evaluation (Signed)
Anesthesia Evaluation  Patient identified by MRN, date of birth, ID band Patient awake    Reviewed: Allergy & Precautions, H&P , NPO status , Patient's Chart, lab work & pertinent test results  Airway Mallampati: II   Neck ROM: full    Dental   Pulmonary shortness of breath, asthma , COPD,  oxygen dependent, former smoker,  Stage 4 lung CA   breath sounds clear to auscultation       Cardiovascular negative cardio ROS   Rhythm:regular Rate:Normal     Neuro/Psych  Neuromuscular disease    GI/Hepatic GERD  ,  Endo/Other    Renal/GU      Musculoskeletal  (+) Arthritis ,   Abdominal   Peds  Hematology  (+) Blood dyscrasia, anemia , PLTS 82   Anesthesia Other Findings   Reproductive/Obstetrics                             Anesthesia Physical Anesthesia Plan  ASA: III  Anesthesia Plan: General   Post-op Pain Management:    Induction: Intravenous  PONV Risk Score and Plan: 3 and Ondansetron, Dexamethasone and Treatment may vary due to age or medical condition  Airway Management Planned: Oral ETT  Additional Equipment:   Intra-op Plan:   Post-operative Plan: Extubation in OR  Informed Consent: I have reviewed the patients History and Physical, chart, labs and discussed the procedure including the risks, benefits and alternatives for the proposed anesthesia with the patient or authorized representative who has indicated his/her understanding and acceptance.       Plan Discussed with: CRNA, Anesthesiologist and Surgeon  Anesthesia Plan Comments:         Anesthesia Quick Evaluation

## 2020-01-02 NOTE — Anesthesia Procedure Notes (Signed)

## 2020-01-02 NOTE — Progress Notes (Signed)
Radiation Oncology         [phone] ________________________________  Initial Outpatient Consultation  Name: Elizabeth Mayer MRN: [id number]  Date: 01/03/2020  DOB: 1960/08/20  CC:Minette Brine, FNP  Minette Brine, FNP   REFERRING PHYSICIAN: Minette Brine, FNP  DIAGNOSIS:     ICD-10-CM   1. Metastasis to brain [facility])  C79.31   2. Brain metastases ([place])  C79.31      HISTORY OF PRESENT ILLNESS::Elizabeth Mayer is a 59 y.o. female who presented to Minette Brine, FNP, on 07/31/2019 with compliant of left-sided neck pain. On examination, the patient's neck was noted to have a firm area with associated lymphadenopathy to the supraclavicular region, anterior chin, and posterior chin on the left side of the neck. Soft tissue ultrasound of the head and neck on 08/09/2019 showed a palpable area of concern in the left neck that corresponded to a solid-appearing 5.4 cm mass. There were additional abnormal lymph nodes in the right cervical region that were concerning for nodal metastatic disease.  In July of 2021, the patient was diagnosed with stage IV non-small cell right lung cancer that was most consistent with adenocarcinoma based upon a left neck core needle biopsy.  Could not exclude metastatic adenosquamous carcinoma.   Initial PET scan was performed on November 20, 2019.  I personally reviewed those images.  They demonstrate a right hilar mass with extensive adenopathy in the lower neck and mediastinum.  The dominant bulky adenopathy is in the left lower neck but there is also adenopathy in the right lower neck.  She has a nonspecific left adrenal nodule.  There is a trace left pleural effusion.  There are hypermetabolic masses in the axillary regions bilaterally, right more than left.  MRI of the brain on 12/05/2019 demonstrated four intraparenchymal supratentorial metastases with mild edema. No midline shift or other significant mass effect was noted.   She was treated with palliative  radiotherapy from 11/15/2019 to 12/06/2019 under the care of Dr. Sondra Come (see below).  The patient began palliative systemic chemotherapy with Carboplatin, Alimta, and Keytruda on 12/14/2019 under the care of Dr. Julien Nordmann and [facility], PA-C.  Repeat MRI of brain was performed yesterday, 01/02/2020, and showed multiple enhancing lesions scattered throughout the brain parenchyma, consistent with metastatic disease to the brain with multiple new lesions identified. Focal leptomeningeal extension of disease was appreciated in at least one lesion.  To my eye, there are approximately 20 lesions.  She reports continued throat pain from previous radiation, and difficulty swallowing. She is scheduled for a GI consult on 01/19/2020.   Dose of Decadron, if applicable: No home prescription, but receives 10mg  IV during chemotherapy/inmmunotherapy treatments.  Recent neurologic symptoms, if any:   Seizures: None  Headaches: None  Nausea: Yes--she takes disintegrating Zofran to manage  Dizziness/ataxia: At baseline no, but occasionally she feels a little dizzy/off balance when she takes her pain medication  Difficulty with hand coordination: None  Focal numbness/weakness: None  Visual deficits/changes: Reports her prescription eye glasses are no longer effective; continues to have ptosis of her left eye  Confusion/Memory deficits: Expressive aphasia (first noticed around March 2021)  PREVIOUS RADIATION THERAPY: Yes (Dr. Sondra Come)  Radiation Treatment Dates: 11/15/2019 through 12/06/2019 Site Technique Total Dose (Gy) Dose per Fx (Gy) Completed Fx Beam Energies  Lung, Right: Lung_Rt 3D 35/35 2.5 14/14 6X, 10X, 15X    PAST MEDICAL HISTORY:  has a past medical history of Anemia, Angina (1982), Asthma, Cancer ([place]), Complication of anesthesia, Constipation, GERD (  gastroesophageal reflux disease), Heart murmur (1970s), Ingrown toenail, Past heart attack ([id number]), and Pneumonia.    PAST  SURGICAL HISTORY: Past Surgical History:  Procedure Laterality Date  . ingrown toe nail surgery    . NECK SURGERY  2000   Disc removed from neck   . RADIOLOGY WITH ANESTHESIA N/A 12/05/2019   Procedure: MRI BRAIN WITH AND WITHOUT CONTRAST;  Surgeon: Radiologist, Medication, MD;  Location: [place];  Service: Radiology;  Laterality: N/A;  . RADIOLOGY WITH ANESTHESIA N/A 01/02/2020   Procedure: MRI BRAIN WITH AND WITHOUT CONTRAST;  Surgeon: Radiologist, Medication, MD;  Location: [place];  Service: Radiology;  Laterality: N/A;    FAMILY HISTORY: family history includes Heart failure in her father.  SOCIAL HISTORY:  reports that she quit smoking about 20 years ago. Her smoking use included cigarettes. She started smoking about 41 years ago. She has a 20.00 pack-year smoking history. She has never used smokeless tobacco. She reports that she does not drink alcohol and does not use drugs.  ALLERGIES: Pseudoephedrine, Gabapentin, Mobic [meloxicam], and Penicillins  MEDICATIONS:  Current Outpatient Medications  Medication Sig Dispense Refill  . ondansetron (ZOFRAN ODT) 8 MG disintegrating tablet Take 1 tablet (8 mg total) by mouth every 8 (eight) hours as needed for nausea or vomiting. 30 tablet 3  . acetaminophen (TYLENOL) 500 MG tablet Take 1,000 mg by mouth every 6 (six) hours as needed for moderate pain. (Patient not taking: Reported on 01/04/2020)    . albuterol (VENTOLIN HFA) 108 (90 Base) MCG/ACT inhaler Inhale 2 puffs into the lungs every 6 (six) hours as needed for wheezing or shortness of breath. (Patient not taking: Reported on 01/04/2020) 18 g 6  . Artificial Saliva (BIOTENE DRY MOUTH MOISTURIZING) SOLN Use as directed 1 Dose in the mouth or throat as needed (dry mouth).    . Carboxymethylcellul-Glycerin (LUBRICATING EYE DROPS OP) Place 1 drop into both eyes daily as needed (irritation).    . Cream Base (WOUND CARE) CREA Apply 1 application topically as needed (wound care).    . DIGESTIVE  ENZYMES PO Take 0.5-1 tablets by mouth 3 (three) times daily before meals.    . docusate sodium (COLACE) 100 MG capsule Take 300 mg by mouth daily.    . fexofenadine (ALLEGRA) 180 MG tablet Take 180 mg by mouth at bedtime.    . fluticasone (FLONASE) 50 MCG/ACT nasal spray Place 2 sprays into both nostrils daily.    . folic acid (FOLVITE) 1 MG tablet Take 1 tablet (1 mg total) by mouth daily. 30 tablet 4  . guaiFENesin (MUCINEX) 600 MG 12 hr tablet Take 600 mg by mouth 2 (two) times daily.     Marland Kitchen HYDROcodone-acetaminophen (HYCET) 7.5-325 mg/15 ml solution Take 10 mLs by mouth 4 (four) times daily as needed for moderate pain. For pain from cancer and radiation reactions (Patient taking differently: Take 10 mLs by mouth 2 (two) times daily. For pain from cancer and radiation reactions) 473 mL 0  . HYDROcodone-acetaminophen (NORCO/VICODIN) 5-325 MG tablet Take 1-2 tablets by mouth every 6 (six) hours as needed for moderate pain. (Patient not taking: Reported on 12/26/2019) 60 tablet 0  . lidocaine (XYLOCAINE) 2 % solution Use as directed 15 mLs in the mouth or throat as needed for mouth pain. Swallow 30 min prior to meals and at bedtime for throat/esophagus pain (Patient not taking: Reported on 01/04/2020) 250 mL 1  . LORazepam (ATIVAN) 2 MG tablet Take 1 tab 30 min before radiation treatment to  the brain. 1 tablet 0  . neomycin-bacitracin-polymyxin (NEOSPORIN) ointment Apply 1 application topically daily as needed for wound care.    . ondansetron (ZOFRAN) 4 MG tablet Take 1 tablet (4 mg total) by mouth every 8 (eight) hours as needed for nausea or vomiting. (Patient not taking: Reported on 12/26/2019) 20 tablet 0  . orphenadrine (NORFLEX) 100 MG tablet Take 1 tablet (100 mg total) by mouth 2 (two) times daily as needed for muscle spasms. (Patient not taking: Reported on 12/26/2019) 60 tablet 1  . potassium chloride SA (KLOR-CON) 20 MEQ tablet Take 1 tablet (20 mEq total) by mouth daily. (Patient not taking:  Reported on 01/04/2020) 5 tablet 0  . prochlorperazine (COMPAZINE) 10 MG tablet Take 1 tablet (10 mg total) by mouth every 6 (six) hours as needed. (Patient not taking: Reported on 12/26/2019) 30 tablet 2  . sucralfate (CARAFATE) 1 g tablet Take 1 tablet (1 g total) by mouth 4 (four) times daily -  with meals and at bedtime. (Patient not taking: Reported on 01/04/2020) 90 tablet 2   No current facility-administered medications for this encounter.    REVIEW OF SYSTEMS:  As above.   PHYSICAL EXAM:  height is 5\' 5"  (1.651 m) and weight is 132 lb 9.6 oz (60.1 kg). Her temperature is 97.7 F (36.5 C). Her blood pressure is 110/68 and her pulse is 81. Her respiration is 18 and oxygen saturation is 98%.   General: Alert and oriented, in no acute distress  HEENT: Head is normocephalic. Extraocular movements are intact. Oropharynx is clear without thrush. Musculoskeletal: symmetric strength and muscle tone throughout.  Ambulatory. Neurologic: Mild left ptosis.  Vision is blurry but visual quadrants are intact.  Speech is fluent. Coordination is intact.  Object recall is 3 out of 3 at 3 minutes. Psychiatric: Judgment and insight are intact. Affect is appropriate.   LABORATORY DATA:  Lab Results  Component Value Date   WBC 3.3 (L) 01/04/2020   HGB 11.5 (L) 01/04/2020   HCT 36.4 01/04/2020   MCV 88.1 01/04/2020   PLT 275 01/04/2020   CMP     Component Value Date/Time   NA 142 01/04/2020 0907   NA 139 07/31/2019 1518   K 3.5 01/04/2020 0907   CL 105 01/04/2020 0907   CO2 29 01/04/2020 0907   GLUCOSE 85 01/04/2020 0907   BUN <4 (L) 01/04/2020 0907   BUN 8 07/31/2019 1518   CREATININE 0.75 01/04/2020 0907   CALCIUM 9.3 01/04/2020 0907   PROT 6.9 01/04/2020 0907   PROT 6.4 01/19/2019 1129   ALBUMIN 3.1 (L) 01/04/2020 0907   ALBUMIN 4.0 01/19/2019 1129   AST 19 01/04/2020 0907   ALT 14 01/04/2020 0907   ALKPHOS 50 01/04/2020 0907   BILITOT 0.2 (L) 01/04/2020 0907   GFRNONAA >60 01/04/2020  0907   GFRAA >60 01/04/2020 0907         RADIOGRAPHY: MR Brain W Wo Contrast  Result Date: 01/02/2020 CLINICAL DATA:  Brain/CNS neoplasm, surveillance. Stage IV non-small cell lung cancer. EXAM: MRI HEAD WITHOUT AND WITH CONTRAST TECHNIQUE: Multiplanar, multiecho pulse sequences of the brain and surrounding structures were obtained without and with intravenous contrast. CONTRAST:  10mL GADAVIST GADOBUTROL 1 MMOL/ML IV SOLN COMPARISON:  MRI of the brain December 05, 2019. FINDINGS: Brain: No acute infarction, hemorrhage, hydrocephalus or extra-axial collection. Multiple enhancing lesions are seen scattered throughout the brain parenchyma, consistent with metastatic disease to the brain. Some of the smaller lesions may have not been  identified on prior MRI due to differences in technique. Lesions described prior (series 9): 1. Posterior right parietal lobe with leptomeningeal extension, 9 mm (8 mm on prior), image 97. Leptomeningeal extension better appreciated on series 10, image 11. 2. Right precentral gyrus, 2 mm (25mm on prior), image 117. 3. Right superior frontal gyrus, 9 mm, (8 mm on prior), image 123. 4. Left superior frontal gyrus, 9 mm, (5 mm on prior), image 132. Additional lesions (series 9): 1. Right pre frontal gyrus, 3 mm, image 130. 2. Right temporal lobe, 7 mm, image 64. 3. Left paracentral lobule, 5 mm, image 112. 4. At least 10 punctate (1-2 mm) enhancing lesions are seen scattered in the bilateral cerebral hemispheres. Vascular: Normal flow voids. Skull and upper cervical spine: Normal marrow signal. Mild mucosal thickening Sinuses/Orbits: Mild mucosal thickening of the frontal and ethmoid sinuses. The orbits are maintained. Other: None. IMPRESSION: 1. Multiple enhancing lesions scattered throughout the brain parenchyma, consistent with metastatic disease to the brain with multiple new lesions identified in the current study. 2. Leptomeningeal extension of disease appreciated in at least one  lesion. These results will be called to the ordering clinician or representative by the Radiologist Assistant, and communication documented in the PACS or [employer]. Electronically Signed   By: Pedro Earls M.D.   On: 01/02/2020 18:53      IMPRESSION/PLAN: This is a very pleasant 59 year old female with metastatic disease to the brain.  I had a lengthy discussion with the patient after reviewing their MRI results with them.  We spoke about whole brain radiotherapy versus stereotactic radiosurgery to the brain. We spoke about the differing risks benefits and side effects of both of these treatments.  She understands that based on the number of metastases and the focal degree of leptomeningeal disease, the most standard treatment would be whole brain radiation therapy.  She understands that if we do not treat her brain comprehensively there is a very high likelihood that she will need salvage radiotherapy to other parts of the brain in the future. We spoke about the hair loss, fatigue and cognitive effects that can result from whole brain radiotherapy.  I also acknowledged that she would need to take a break from chemotherapy during whole brain radiation therapy.   We talked about the alternative treatment in the form of radiosurgery, in which we would only target the lesions that we currently see while sparing the rest of her brain as much as possible from radiation exposure; we spoke about radionecrosis that can result from stereotactic radiosurgery. I reiterated that whole brain radiotherapy is more comprehensive and therefore can decrease the chance of recurrences elsewhere in the brain, while stereotactic radiosurgery only treats the areas of gross disease while sparing the rest of the brain parenchyma.  We also discussed the logistics of both treatments.  She has significant claustrophobia.  Her claustrophobia requires heavy sedation during MRIs.  She has concerns about her  ability to tolerate a thermoplastic mask during treatment for immobilization.  She understands whole brain radiation would take place 5 days a week for 2 to 3 weeks whereas stereotactic radiosurgery could be performed in 1 session.    She is keen on continuing all of her systemic therapy without any breaks, as well as minimizing the risk of cognitive decline and extreme fatigue from radiation therapy.  She is keen on undergoing radiation therapy and a minimal number of sessions given her extreme claustrophobia.  After lengthy discussion, the  patient would like to proceed with stereotactic brain radiosurgery to their metastatic disease.  She is a good understanding of the different potential side effects of both treatments as well as the likelihood that if she undergoes radiosurgery, she will need more treatment in the future to the brain.  We discussed getting a follow-up MRI 6 weeks after treatment to follow her brain closely.  She is pleased with this plan.  They will meet with neurosurgery in the near future to discuss this further; a neurosurgeon will participate in their case.  CT simulation will take place today and treatment next week.  I plan to deliver 18-20 Gy in 1 fraction to each of the lesions.  On date of service, in total, I spent 60 minutes on this encounter. Patient was seen in person.  __________________________________________   Eppie Gibson, MD  This document serves as a record of services personally performed by Eppie Gibson, MD. It was created on his behalf by Clerance Lav, a trained medical scribe. The creation of this record is based on the scribe's personal observations and the provider's statements to them. This document has been checked and approved by the attending provider.

## 2020-01-03 ENCOUNTER — Ambulatory Visit
Admission: RE | Admit: 2020-01-03 | Discharge: 2020-01-03 | Disposition: A | Discharge status: Home / Self Care | Payer: Managed Care, Other (non HMO) | Source: Ambulatory Visit | Attending: Radiation Oncology | Admitting: Radiation Oncology

## 2020-01-03 ENCOUNTER — Other Ambulatory Visit: Payer: Self-pay | Admitting: Radiation Therapy

## 2020-01-03 ENCOUNTER — Ambulatory Visit: Payer: Managed Care, Other (non HMO) | Admitting: Radiation Oncology

## 2020-01-03 ENCOUNTER — Other Ambulatory Visit: Payer: Self-pay

## 2020-01-03 ENCOUNTER — Encounter: Payer: Self-pay | Admitting: Radiation Oncology

## 2020-01-03 VITALS — BP 110/68 | HR 81 | Temp 97.7°F | Resp 18 | Ht 65.0 in | Wt 132.6 lb

## 2020-01-03 DIAGNOSIS — C7931 Secondary malignant neoplasm of brain: Secondary | ICD-10-CM

## 2020-01-03 DIAGNOSIS — C7949 Secondary malignant neoplasm of other parts of nervous system: Secondary | ICD-10-CM

## 2020-01-03 MED ORDER — LORAZEPAM 1 MG PO TABS
2.0000 mg | ORAL_TABLET | Freq: Once | ORAL | Status: AC
  Administered 2020-01-03: 2 mg via ORAL
  Filled 2020-01-03: qty 2

## 2020-01-03 MED ORDER — LORAZEPAM 1 MG PO TABS
ORAL_TABLET | ORAL | Status: AC
  Filled 2020-01-03: qty 2

## 2020-01-03 MED ORDER — SODIUM CHLORIDE 0.9% FLUSH
10.0000 mL | Freq: Once | INTRAVENOUS | Status: AC
  Administered 2020-01-03: 10 mL via INTRAVENOUS

## 2020-01-03 NOTE — Progress Notes (Signed)
[facility] OFFICE PROGRESS NOTE  Minette Brine, [place] [address] [address]  DIAGNOSIS: Stage IV non-small cell lung cancer, most consistent with adenocarcinoma.The patient presented with a right perihilar lung mass which encases and occludes the right lower lobe pulmonary artery. There is also associated case meant of the right upper lobe, right middle lobe, and right lower lobe bronchi. The polyp patient also has associated bilateral mediastinal, right axillary, cervical, and supraclavicular lymphadenopathy. She also has metastatic disease to the brain. The patient was diagnosed in July 2021  PDL1 Expression: 95%  Biomarker Findings Tumor Mutational Burden - 16 Muts/Mb Microsatellite status - MS-Stable Genomic Findings For a complete list of the genes assayed, please refer to the Appendix. KRAS G12V PTEN loss exon 2 KDM6A deletion exons 6-21 TERT promoter -124C>T TP53 V274F 7 Disease relevant genes with no reportable alterations: ALK, BRAF, EGFR, ERBB2, MET, RET, ROS1  Tumor Proportion Score (TPS) (95%)  PRIOR THERAPY:  Palliative radiotherapy to the lung mass which is causing encasement of the right upper lobe, right middle lobe, and right lower lobe as well as palliative radiotherapy to the painful cervical adenopathy under the care of Dr. Sondra Come. Last dose of radiation on 12/04/2019   CURRENT THERAPY:  1) Palliative systemic chemotherapy with carboplatin for an AUC of 5, Alimta 500 mg per metered squared, Keytruda 200 mg IV every 3 weeks.  First dose on 12/12/19. Status post 1 cycle.  2) SRS to the brain metastases under the care of Dr. Isidore Moos. Treatment expected on 01/03/20 and 01/10/20.  INTERVAL HISTORY: Elizabeth Mayer 59 y.o. female returns to the clinic today for a follow-up visit.  The patient is feeling fair today.  The patient was recently diagnosed with metastatic and bulky disease.  She has been following with radiation  oncology for brain metastases.  She had a repeat brain MRI per [facility] protocol performed a few days ago which already showed multiple new lesions compared to her MRI 1 month prior.  The patient is extremely claustrophobic and she required her scans to be performed under sedation. she is scheduled for brain radiation on 01/03/20 and 01/10/2020.  The patient also has been endorsing dysphagia and associated weight loss. A member of the nutritionist team is scheduled to see her next month. Her weight is stable-increased compared to last time, she actually gained a few pounds. At her last appointment, she was referred to gastroenterology and has appointment with them on 01/19/2020.  She was also given a prescription of Carafate and treated for oral thrush with Diflucan. Her swallowing is a little bit better but she still reports dysphagia and the sensation of food getting stuck.   She was also given portable oxygen due to the patient endorsing shortness of breath lying flat.  She had a walking/pulse ox evaluation at her last visit and she qualified for portable oxygen. She never received the oxygen. She had a repeat walking test today and she does not qualify for oxygen at this time. She reports her shortness of breath is a little bit better but still reports orthopnea. She continues to have a cough but it also is a little bit better as well.   Otherwise, she denies any recent fever, chills, or night sweats.  She reports dry heaves which is a little better with the sublingual zofran.  She denies any chest pain or hemoptysis. She has bulky disease in the neck for which she completed palliative radiotherapy.  She often  describes a "pulling sensation into her neck".  She still continues to have ptosis of her left eye. The patient is concerned about her ingrown toe nails and is requesting a referral to podiatry.   The patient is here today for evaluation before starting cycle #2.  MEDICAL HISTORY: Past Medical History:   Diagnosis Date  . Anemia   . Angina 1982   related to stress  . Asthma    in the past, not now- never inhlaer use  . Cancer ([place])    lung  . Complication of anesthesia    states anesthesia made her hair fall out  . Constipation   . GERD (gastroesophageal reflux disease)   . Heart murmur 1970s  . Ingrown toenail   . Past heart attack 1980-1981   pt states she passed out and woke up in hospital- told she had heart attack, but then dr said he couldn't find anything wrong.  . Pneumonia     ALLERGIES:  is allergic to pseudoephedrine, gabapentin, mobic [meloxicam], and penicillins.  MEDICATIONS:  Current Outpatient Medications  Medication Sig Dispense Refill  . Artificial Saliva (BIOTENE DRY MOUTH MOISTURIZING) SOLN Use as directed 1 Dose in the mouth or throat as needed (dry mouth).    . Carboxymethylcellul-Glycerin (LUBRICATING EYE DROPS OP) Place 1 drop into both eyes daily as needed (irritation).    . Cream Base (WOUND CARE) CREA Apply 1 application topically as needed (wound care).    . DIGESTIVE ENZYMES PO Take 0.5-1 tablets by mouth 3 (three) times daily before meals.    . docusate sodium (COLACE) 100 MG capsule Take 300 mg by mouth daily.    . fexofenadine (ALLEGRA) 180 MG tablet Take 180 mg by mouth at bedtime.    . fluticasone (FLONASE) 50 MCG/ACT nasal spray Place 2 sprays into both nostrils daily.    . folic acid (FOLVITE) 1 MG tablet Take 1 tablet (1 mg total) by mouth daily. 30 tablet 4  . guaiFENesin (MUCINEX) 600 MG 12 hr tablet Take 600 mg by mouth 2 (two) times daily.     Marland Kitchen HYDROcodone-acetaminophen (HYCET) 7.5-325 mg/15 ml solution Take 10 mLs by mouth 4 (four) times daily as needed for moderate pain. For pain from cancer and radiation reactions (Patient taking differently: Take 10 mLs by mouth 2 (two) times daily. For pain from cancer and radiation reactions) 473 mL 0  . neomycin-bacitracin-polymyxin (NEOSPORIN) ointment Apply 1 application topically daily as needed  for wound care.    . ondansetron (ZOFRAN ODT) 8 MG disintegrating tablet Take 1 tablet (8 mg total) by mouth every 8 (eight) hours as needed for nausea or vomiting. 30 tablet 3  . acetaminophen (TYLENOL) 500 MG tablet Take 1,000 mg by mouth every 6 (six) hours as needed for moderate pain. (Patient not taking: Reported on 01/04/2020)    . albuterol (VENTOLIN HFA) 108 (90 Base) MCG/ACT inhaler Inhale 2 puffs into the lungs every 6 (six) hours as needed for wheezing or shortness of breath. (Patient not taking: Reported on 01/04/2020) 18 g 6  . HYDROcodone-acetaminophen (NORCO/VICODIN) 5-325 MG tablet Take 1-2 tablets by mouth every 6 (six) hours as needed for moderate pain. (Patient not taking: Reported on 12/26/2019) 60 tablet 0  . lidocaine (XYLOCAINE) 2 % solution Use as directed 15 mLs in the mouth or throat as needed for mouth pain. Swallow 30 min prior to meals and at bedtime for throat/esophagus pain (Patient not taking: Reported on 01/04/2020) 250 mL 1  . ondansetron (ZOFRAN)  4 MG tablet Take 1 tablet (4 mg total) by mouth every 8 (eight) hours as needed for nausea or vomiting. (Patient not taking: Reported on 12/26/2019) 20 tablet 0  . orphenadrine (NORFLEX) 100 MG tablet Take 1 tablet (100 mg total) by mouth 2 (two) times daily as needed for muscle spasms. (Patient not taking: Reported on 12/26/2019) 60 tablet 1  . potassium chloride SA (KLOR-CON) 20 MEQ tablet Take 1 tablet (20 mEq total) by mouth daily. (Patient not taking: Reported on 01/04/2020) 5 tablet 0  . prochlorperazine (COMPAZINE) 10 MG tablet Take 1 tablet (10 mg total) by mouth every 6 (six) hours as needed. (Patient not taking: Reported on 12/26/2019) 30 tablet 2  . sucralfate (CARAFATE) 1 g tablet Take 1 tablet (1 g total) by mouth 4 (four) times daily -  with meals and at bedtime. (Patient not taking: Reported on 01/04/2020) 90 tablet 2   No current facility-administered medications for this visit.    SURGICAL HISTORY:  Past Surgical  History:  Procedure Laterality Date  . ingrown toe nail surgery    . NECK SURGERY  2000   Disc removed from neck   . RADIOLOGY WITH ANESTHESIA N/A 12/05/2019   Procedure: MRI BRAIN WITH AND WITHOUT CONTRAST;  Surgeon: Radiologist, Medication, MD;  Location: [facility];  Service: Radiology;  Laterality: N/A;  . RADIOLOGY WITH ANESTHESIA N/A 01/02/2020   Procedure: MRI BRAIN WITH AND WITHOUT CONTRAST;  Surgeon: Radiologist, Medication, MD;  Location: [facility];  Service: Radiology;  Laterality: N/A;    REVIEW OF SYSTEMS:   Review of Systems  Constitutional:Positive for fatigue. Negative for appetite change, weight loss, chills,and fever. HENT: Positive for dysphagia.  Negative for mouth sores, nosebleeds, and sore throat. Eyes:Positive for ptosis on left eyelid.Negative fordiplopia, photophobia, visual field defects, oricterus.  Respiratory:Positive for cough and shortness of breath (mildly improved).Negative for wheezing.  Cardiovascular: Negative for chest pain and leg swelling.  Gastrointestinal:Positive for mild baseline constipation. Positive for occasional dry heaves.Negative for abdominal pain and diarrhea. Genitourinary: Negative for bladder incontinence, difficulty urinating, dysuria, frequency and hematuria.  Musculoskeletal:Positive for neck pain and stiffness.Negative for back pain gait problem. Skin: Negative for itching and rash.  Neurological: Negative for dizziness, extremity weakness, gait problem, headaches, light-headedness and seizures.  Hematological:Positive for bilateral cervical/supraclavicular adenopathy (L>R). Positive for right axillary adenopathy.Does not bruise/bleed easily.  Psychiatric/Behavioral: Negative for confusion, depression and sleep disturbance. The patient is not nervous/anxious.  PHYSICAL EXAMINATION:  Blood pressure 124/82, pulse 90, temperature 98.6 F (37 C), temperature source Tympanic, resp. rate (!) 21, height _0  (1.651 m), weight  132 lb 3.2 oz (60 kg), SpO2 98 %.  ECOG PERFORMANCE STATUS: 1 - Symptomatic but completely ambulatory  Physical Exam  Constitutional: Oriented to person, place, and time and well-developed, well-nourished, and in no distress.  HENT:  Head: Normocephalic and atraumatic.  Mouth/Throat: Oropharynx is clear and moist. Some white coating on tongue.  Eyes:Ptosis noted on left eyelid.Conjunctivae are normal. Right eye exhibits no discharge. Left eye exhibits no discharge. No scleral icterus.  Neck:Bilateral cervical/supraclavicular adenopathy (L>R). Range of motion limited by large neck mass.Neck supple.  Cardiovascular: Normal rate, regular rhythm, normal heart sounds and intact distal pulses.  Pulmonary/Chest: Effort normal. Decreased breath sounds in right lung.No respiratory distress. No wheezes. No rales.  Abdominal: Soft. Bowel sounds are normal. Exhibits no distension and no mass. There is no tenderness.  Musculoskeletal: Normal range of motion. Exhibits no edema.  Lymphadenopathy:  Right axillary, bilateral cervical and supraclavicular  adenopathy..  Neurological: Alert and oriented to person, place, and time. Exhibits normal muscle tone. Gait normal. Coordination normal.  Skin: Skin is warm and dry. No rash noted. Not diaphoretic. No erythema. No pallor.  Psychiatric: Mood, memory and judgment normal.  Vitals reviewed.  LABORATORY DATA: Lab Results  Component Value Date   WBC 3.3 (L) 01/04/2020   HGB 11.5 (L) 01/04/2020   HCT 36.4 01/04/2020   MCV 88.1 01/04/2020   PLT 275 01/04/2020      Chemistry      Component Value Date/Time   NA 142 01/04/2020 0907   NA 139 07/31/2019 1518   K 3.5 01/04/2020 0907   CL 105 01/04/2020 0907   CO2 29 01/04/2020 0907   BUN <4 (L) 01/04/2020 0907   BUN 8 07/31/2019 1518   CREATININE 0.75 01/04/2020 0907      Component Value Date/Time   CALCIUM 9.3 01/04/2020 0907   ALKPHOS 50 01/04/2020 0907   AST 19 01/04/2020 0907   ALT 14  01/04/2020 0907   BILITOT 0.2 (L) 01/04/2020 0907       RADIOGRAPHIC STUDIES:  MR Brain W Wo Contrast  Result Date: 01/02/2020 CLINICAL DATA:  Brain/CNS neoplasm, surveillance. Stage IV non-small cell lung cancer. EXAM: MRI HEAD WITHOUT AND WITH CONTRAST TECHNIQUE: Multiplanar, multiecho pulse sequences of the brain and surrounding structures were obtained without and with intravenous contrast. CONTRAST:  39m GADAVIST GADOBUTROL 1 MMOL/ML IV SOLN COMPARISON:  MRI of the brain December 05, 2019. FINDINGS: Brain: No acute infarction, hemorrhage, hydrocephalus or extra-axial collection. Multiple enhancing lesions are seen scattered throughout the brain parenchyma, consistent with metastatic disease to the brain. Some of the smaller lesions may have not been identified on prior MRI due to differences in technique. Lesions described prior (series 9): 1. Posterior right parietal lobe with leptomeningeal extension, 9 mm (8 mm on prior), image 97. Leptomeningeal extension better appreciated on series 10, image 11. 2. Right precentral gyrus, 2 mm (28mon prior), image 117. 3. Right superior frontal gyrus, 9 mm, (8 mm on prior), image 123. 4. Left superior frontal gyrus, 9 mm, (5 mm on prior), image 132. Additional lesions (series 9): 1. Right pre frontal gyrus, 3 mm, image 130. 2. Right temporal lobe, 7 mm, image 64. 3. Left paracentral lobule, 5 mm, image 112. 4. At least 10 punctate (1-2 mm) enhancing lesions are seen scattered in the bilateral cerebral hemispheres. Vascular: Normal flow voids. Skull and upper cervical spine: Normal marrow signal. Mild mucosal thickening Sinuses/Orbits: Mild mucosal thickening of the frontal and ethmoid sinuses. The orbits are maintained. Other: None. IMPRESSION: 1. Multiple enhancing lesions scattered throughout the brain parenchyma, consistent with metastatic disease to the brain with multiple new lesions identified in the current study. 2. Leptomeningeal extension of disease  appreciated in at least one lesion. These results will be called to the ordering clinician or representative by the Radiologist Assistant, and communication documented in the PACS or ClFrontier Oil CorporationElectronically Signed   By: KaPedro Earls.D.   On: 01/02/2020 18:53   MR Brain W Wo Contrast  Result Date: 12/06/2019 CLINICAL DATA:  Non-small cell lung carcinoma staging EXAM: MRI HEAD WITHOUT AND WITH CONTRAST TECHNIQUE: Multiplanar, multiecho pulse sequences of the brain and surrounding structures were obtained without and with intravenous contrast. CONTRAST:  52m65mADAVIST GADOBUTROL 1 MMOL/ML IV SOLN COMPARISON:  None. FINDINGS: Brain: No acute infarct, acute hemorrhage or extra-axial collection. Normal white matter signal. Normal volume of CSF spaces. No  chronic microhemorrhage. Normal midline structures. There are for contrast-enhancing lesions: 1. Posterior right parietal lobe with mild adjacent edema, 8 mm, series 10, image 34. There is serpiginous enhancement extending inferiorly from this lesion. 2. Right precentral gyrus, 2 mm, image 38 3. Right medial frontal gyrus, 8 mm, image 40 4. Left superior frontal gyrus, 5 mm, image 41 Vascular: Normal flow voids. Skull and upper cervical spine: Normal marrow signal. Sinuses/Orbits: Negative. Other: None IMPRESSION: Four intraparenchymal supratentorial metastases with mild edema. No midline shift or other significant mass effect. Electronically Signed   By: Ulyses Jarred M.D.   On: 12/06/2019 02:36     ASSESSMENT/PLAN:  This is a very pleasant 8 year oldAfrican Americanfemale diagnosed withmetastatic non-small cell lung cancer, most consistent with adenocarcinoma.The patient presented with a right perihilar lung mass which encases and occludes the right lower lobe pulmonary artery. There is also associatedencasementof the right upper lobe, right middle lobe, and right lower lobe bronchi. The patient also has associated bilateral  mediastinal, right axillary, cervical, and supraclavicular lymphadenopathy. The patient was diagnosed in July 2021. Her PD-L1 expression is 95%. Her foundation 1 molecular studies show no actionable mutations  The patient completed palliative radiotherapy to the bulky cervical lymphadenopathy and the obstructive mass which is encasing the right upper lobe, right middle lobe, and right lower lobe. She is receiving radiation under the care of Dr. Sondra Come. She received her last dose of radiotherapy on 12/04/2019.  The patient was given the option of single agent immunotherapy versus starting palliative systemic chemotherapy/immunotherapy.  The patient decided to pursue systemic palliative chemotherapy/immunotherapy with carboplatin for an AUC of 5, Alimta 500 mg per metered squared, Keytruda 200 mg IV every 3 weeks.  She is status post 1 cycle.    Labs were reviewed.  Recommend that she proceed with cycle #2 tomorrow as scheduled.  She will continue to follow with Dr. Isidore Moos for management and treatment of her numerous brain metastases.   She is instructed to keep her appointment with gastroenterology on 01/19/2020 as scheduled.   A walking test was performed today, and she does not qualify for supplemental oxygen at this time.   I have placed a referral to podiatry for her ingrown toe nail, per patient request.   If needed, I encouraged the patient to use delsym for her cough.   I also provided the patient with constipation education today.   The patient was advised to call immediately if she has any concerning symptoms in the interval. The patient voices understanding of current disease status and treatment options and is in agreement with the current care plan. All questions were answered. The patient knows to call the clinic with any problems, questions or concerns. We can certainly see the patient much sooner if necessary        Orders Placed This Encounter  Procedures  .  Ambulatory referral to Podiatry    Referral Priority:   Routine    Referral Type:   Consultation    Referral Reason:   Specialty Services Required    Requested Specialty:   Podiatry    Number of Visits Requested:   [place], PA-C 01/04/20

## 2020-01-03 NOTE — Progress Notes (Signed)
Has armband been applied?  Yes.    Does patient have an allergy to IV contrast dye?: No.   Has patient ever received premedication for IV contrast dye?: No.   Does patient take metformin?: No.  Date of lab work: December 28, 2019 BUN: 10 CR: 0.75  IV site: antecubital left, condition patent and no redness  Has IV site been added to flowsheet?  Yes.    BP: 110/68 HR: 81 RR: 18 T: 97.7 O2: 98%

## 2020-01-03 NOTE — Anesthesia Postprocedure Evaluation (Signed)
Anesthesia Post Note  Patient: Elizabeth Mayer  Procedure(s) Performed: MRI BRAIN WITH AND WITHOUT CONTRAST (N/A )     Patient location during evaluation: PACU Anesthesia Type: General Level of consciousness: awake and alert Pain management: pain level controlled Vital Signs Assessment: post-procedure vital signs reviewed and stable Respiratory status: spontaneous breathing, nonlabored ventilation, respiratory function stable and patient connected to nasal cannula oxygen Cardiovascular status: blood pressure returned to baseline and stable Postop Assessment: no apparent nausea or vomiting Anesthetic complications: no   No complications documented.  Last Vitals:  Vitals:   01/02/20 1145 01/02/20 1154  BP: (!) 93/59 104/62  Pulse: 78 80  Resp: (!) 34 (!) 24  Temp:  36.8 C  SpO2: 95% 94%    Last Pain:  Vitals:   01/02/20 1154  TempSrc:   PainSc: 0-No pain                 Daemion Mcniel S

## 2020-01-04 ENCOUNTER — Other Ambulatory Visit: Payer: Self-pay | Admitting: Radiation Oncology

## 2020-01-04 ENCOUNTER — Ambulatory Visit: Payer: Self-pay | Admitting: Radiation Oncology

## 2020-01-04 ENCOUNTER — Inpatient Hospital Stay (HOSPITAL_BASED_OUTPATIENT_CLINIC_OR_DEPARTMENT_OTHER): Payer: Managed Care, Other (non HMO) | Admitting: Physician Assistant

## 2020-01-04 ENCOUNTER — Other Ambulatory Visit: Payer: Self-pay

## 2020-01-04 ENCOUNTER — Telehealth: Payer: Self-pay | Admitting: Radiation Therapy

## 2020-01-04 ENCOUNTER — Inpatient Hospital Stay: Payer: Managed Care, Other (non HMO)

## 2020-01-04 VITALS — BP 124/82 | HR 90 | Temp 98.6°F | Resp 21 | Ht 65.0 in | Wt 132.2 lb

## 2020-01-04 DIAGNOSIS — Z5112 Encounter for antineoplastic immunotherapy: Secondary | ICD-10-CM | POA: Diagnosis not present

## 2020-01-04 DIAGNOSIS — C3491 Malignant neoplasm of unspecified part of right bronchus or lung: Secondary | ICD-10-CM

## 2020-01-04 DIAGNOSIS — L600 Ingrowing nail: Secondary | ICD-10-CM | POA: Diagnosis not present

## 2020-01-04 DIAGNOSIS — C7931 Secondary malignant neoplasm of brain: Secondary | ICD-10-CM | POA: Diagnosis not present

## 2020-01-04 DIAGNOSIS — C7949 Secondary malignant neoplasm of other parts of nervous system: Secondary | ICD-10-CM

## 2020-01-04 DIAGNOSIS — Z5111 Encounter for antineoplastic chemotherapy: Secondary | ICD-10-CM

## 2020-01-04 LAB — CBC WITH DIFFERENTIAL (CANCER CENTER ONLY)
Abs Immature Granulocytes: 0.02 10*3/uL (ref 0.00–0.07)
Basophils Absolute: 0.1 10*3/uL (ref 0.0–0.1)
Basophils Relative: 2 %
Eosinophils Absolute: 0.1 10*3/uL (ref 0.0–0.5)
Eosinophils Relative: 4 %
HCT: 36.4 % (ref 36.0–46.0)
Hemoglobin: 11.5 g/dL — ABNORMAL LOW (ref 12.0–15.0)
Immature Granulocytes: 1 %
Lymphocytes Relative: 17 %
Lymphs Abs: 0.6 10*3/uL — ABNORMAL LOW (ref 0.7–4.0)
MCH: 27.8 pg (ref 26.0–34.0)
MCHC: 31.6 g/dL (ref 30.0–36.0)
MCV: 88.1 fL (ref 80.0–100.0)
Monocytes Absolute: 0.3 10*3/uL (ref 0.1–1.0)
Monocytes Relative: 9 %
Neutro Abs: 2.3 10*3/uL (ref 1.7–7.7)
Neutrophils Relative %: 67 %
Platelet Count: 275 10*3/uL (ref 150–400)
RBC: 4.13 MIL/uL (ref 3.87–5.11)
RDW: 13 % (ref 11.5–15.5)
WBC Count: 3.3 10*3/uL — ABNORMAL LOW (ref 4.0–10.5)
nRBC: 0 % (ref 0.0–0.2)

## 2020-01-04 LAB — CMP (CANCER CENTER ONLY)
ALT: 14 U/L (ref 0–44)
AST: 19 U/L (ref 15–41)
Albumin: 3.1 g/dL — ABNORMAL LOW (ref 3.5–5.0)
Alkaline Phosphatase: 50 U/L (ref 38–126)
Anion gap: 8 (ref 5–15)
BUN: <4 mg/dL — ABNORMAL LOW (ref 8–23)
CO2: 29 mmol/L (ref 22–32)
Calcium: 9.3 mg/dL (ref 8.9–10.3)
Chloride: 105 mmol/L (ref 98–111)
Creatinine: 0.75 mg/dL (ref 0.44–1.00)
GFR, Est AFR Am: >60 mL/min (ref >60)
GFR, Estimated: >60 mL/min (ref >60)
Glucose, Bld: 85 mg/dL (ref 70–99)
Potassium: 3.5 mmol/L (ref 3.5–5.1)
Sodium: 142 mmol/L (ref 135–145)
Total Bilirubin: 0.2 mg/dL — ABNORMAL LOW (ref 0.3–1.2)
Total Protein: 6.9 g/dL (ref 6.5–8.1)

## 2020-01-04 LAB — TSH: TSH: 1.619 u[IU]/mL (ref 0.308–3.960)

## 2020-01-04 MED ORDER — LORAZEPAM 2 MG PO TABS: ORAL_TABLET | ORAL | 0 refills | Status: DC

## 2020-01-04 NOTE — Telephone Encounter (Signed)
I called and left a detailed voicemail for Elizabeth Mayer, to inform her that Dr. Isidore Moos has sent in a prescription for lorazepam to take before her [place] treatment. The Rx was sent to the [employer].  Mont Dutton R.T.(R)(T) Radiation Special Procedures Navigator

## 2020-01-04 NOTE — Progress Notes (Signed)
Walking oxygen test done.  Room Air patient 98%, While walking patient averaged 93% and lowest was 89%.  Patient was encouraged to slow walking pace and to discourage talking or holding breathe while walking which is when oxygen levels went to lowest.  PA notified.

## 2020-01-05 ENCOUNTER — Ambulatory Visit: Payer: Managed Care, Other (non HMO)

## 2020-01-05 ENCOUNTER — Inpatient Hospital Stay: Payer: Managed Care, Other (non HMO) | Attending: Physician Assistant

## 2020-01-05 ENCOUNTER — Telehealth: Payer: Self-pay | Admitting: Adult Health

## 2020-01-05 ENCOUNTER — Other Ambulatory Visit: Payer: Self-pay

## 2020-01-05 ENCOUNTER — Encounter: Payer: Self-pay | Admitting: Internal Medicine

## 2020-01-05 VITALS — BP 115/64 | HR 88 | Temp 98.2°F | Resp 24

## 2020-01-05 DIAGNOSIS — C7931 Secondary malignant neoplasm of brain: Secondary | ICD-10-CM | POA: Diagnosis not present

## 2020-01-05 DIAGNOSIS — Z5112 Encounter for antineoplastic immunotherapy: Secondary | ICD-10-CM | POA: Insufficient documentation

## 2020-01-05 DIAGNOSIS — C3491 Malignant neoplasm of unspecified part of right bronchus or lung: Secondary | ICD-10-CM | POA: Insufficient documentation

## 2020-01-05 DIAGNOSIS — Z5111 Encounter for antineoplastic chemotherapy: Secondary | ICD-10-CM | POA: Insufficient documentation

## 2020-01-05 DIAGNOSIS — Z23 Encounter for immunization: Secondary | ICD-10-CM | POA: Insufficient documentation

## 2020-01-05 DIAGNOSIS — Z923 Personal history of irradiation: Secondary | ICD-10-CM | POA: Insufficient documentation

## 2020-01-05 DIAGNOSIS — Z79899 Other long term (current) drug therapy: Secondary | ICD-10-CM | POA: Diagnosis not present

## 2020-01-05 MED ORDER — SODIUM CHLORIDE 0.9 % IV SOLN
10.0000 mg | Freq: Once | INTRAVENOUS | Status: AC
  Administered 2020-01-05: 10 mg via INTRAVENOUS
  Filled 2020-01-05: qty 10

## 2020-01-05 MED ORDER — PALONOSETRON HCL INJECTION 0.25 MG/5ML
0.2500 mg | Freq: Once | INTRAVENOUS | Status: AC
  Administered 2020-01-05: 0.25 mg via INTRAVENOUS

## 2020-01-05 MED ORDER — INFLUENZA VAC SPLIT QUAD 0.5 ML IM SUSY
0.5000 mL | PREFILLED_SYRINGE | Freq: Once | INTRAMUSCULAR | Status: AC
  Administered 2020-01-05: 0.5 mL via INTRAMUSCULAR

## 2020-01-05 MED ORDER — SODIUM CHLORIDE 0.9 % IV SOLN
500.0000 mg/m2 | Freq: Once | INTRAVENOUS | Status: AC
  Administered 2020-01-05: 800 mg via INTRAVENOUS
  Filled 2020-01-05: qty 12

## 2020-01-05 MED ORDER — INFLUENZA VAC SPLIT QUAD 0.5 ML IM SUSY
PREFILLED_SYRINGE | INTRAMUSCULAR | Status: AC
  Filled 2020-01-05: qty 0.5

## 2020-01-05 MED ORDER — SODIUM CHLORIDE 0.9 % IV SOLN
484.5000 mg | Freq: Once | INTRAVENOUS | Status: AC
  Administered 2020-01-05: 480 mg via INTRAVENOUS
  Filled 2020-01-05: qty 48

## 2020-01-05 MED ORDER — SODIUM CHLORIDE 0.9 % IV SOLN
Freq: Once | INTRAVENOUS | Status: AC
  Filled 2020-01-05: qty 250

## 2020-01-05 MED ORDER — SODIUM CHLORIDE 0.9 % IV SOLN
150.0000 mg | Freq: Once | INTRAVENOUS | Status: AC
  Administered 2020-01-05: 150 mg via INTRAVENOUS
  Filled 2020-01-05: qty 150

## 2020-01-05 MED ORDER — PALONOSETRON HCL INJECTION 0.25 MG/5ML
INTRAVENOUS | Status: AC
  Filled 2020-01-05: qty 5

## 2020-01-05 MED ORDER — SODIUM CHLORIDE 0.9 % IV SOLN
200.0000 mg | Freq: Once | INTRAVENOUS | Status: AC
  Administered 2020-01-05: 200 mg via INTRAVENOUS
  Filled 2020-01-05: qty 8

## 2020-01-05 NOTE — H&P (Signed)
Anesthesia H&P Update: History and Physical Exam reviewed; patient is OK for planned anesthetic and procedure. ? ?

## 2020-01-05 NOTE — Telephone Encounter (Signed)
Pulse oximetry-summary report + desats-may begin O2 @ 2L QHS.

## 2020-01-05 NOTE — Patient Instructions (Addendum)
[facility] Discharge Instructions for Patients Receiving Chemotherapy  Today you received the following Immunotherapy agent: Pembrolizumab (Keytruda) and chemotherapy agents: Pemetrexed (Alimta) and Cytoxan.  To help prevent nausea and vomiting after your treatment, we encourage you to take your nausea medication as directed by your MD.   If you develop nausea and vomiting that is not controlled by your nausea medication, call the clinic.   BELOW ARE SYMPTOMS THAT SHOULD BE REPORTED IMMEDIATELY:  *FEVER GREATER THAN 100.5 F  *CHILLS WITH OR WITHOUT FEVER  NAUSEA AND VOMITING THAT IS NOT CONTROLLED WITH YOUR NAUSEA MEDICATION  *UNUSUAL SHORTNESS OF BREATH  *UNUSUAL BRUISING OR BLEEDING  TENDERNESS IN MOUTH AND THROAT WITH OR WITHOUT PRESENCE OF ULCERS  *URINARY PROBLEMS  *BOWEL PROBLEMS  UNUSUAL RASH Items with * indicate a potential emergency and should be followed up as soon as possible.  Feel free to call the clinic should you have any questions or concerns. The clinic phone number is [phone].  Please show the [facility] at check-in to the Emergency Department and triage nurse.

## 2020-01-05 NOTE — Progress Notes (Signed)
Patient called with several concerns regarding trust and treatment. Listened to her concerns and advised I would give her contact information to Practice Administrator Benjie Karvonen to follow up.  Discussed one-time $1000 Radio broadcast assistant to assist with personal expenses while going through treatment. Advised what is needed to apply.  Discussed available copay assistance for Keytruda if needed. She gave me permission to apply on her behalf and was very appreciative.  Gave her my contact name and number for any additional financial questions or concerns.

## 2020-01-08 ENCOUNTER — Encounter (HOSPITAL_COMMUNITY): Payer: Self-pay

## 2020-01-08 ENCOUNTER — Emergency Department (HOSPITAL_COMMUNITY)
Admission: EM | Admit: 2020-01-08 | Discharge: 2020-01-08 | Disposition: A | Discharge status: Home / Self Care | Payer: Managed Care, Other (non HMO) | Attending: Emergency Medicine | Admitting: Emergency Medicine

## 2020-01-08 ENCOUNTER — Other Ambulatory Visit: Payer: Self-pay

## 2020-01-08 ENCOUNTER — Telehealth: Payer: Self-pay | Admitting: Radiation Therapy

## 2020-01-08 ENCOUNTER — Encounter: Payer: Self-pay | Admitting: Radiation Oncology

## 2020-01-08 ENCOUNTER — Telehealth: Payer: Self-pay | Admitting: *Deleted

## 2020-01-08 DIAGNOSIS — K921 Melena: Secondary | ICD-10-CM | POA: Diagnosis not present

## 2020-01-08 DIAGNOSIS — Z9221 Personal history of antineoplastic chemotherapy: Secondary | ICD-10-CM | POA: Diagnosis not present

## 2020-01-08 DIAGNOSIS — J4520 Mild intermittent asthma, uncomplicated: Secondary | ICD-10-CM

## 2020-01-08 DIAGNOSIS — C3490 Malignant neoplasm of unspecified part of unspecified bronchus or lung: Secondary | ICD-10-CM | POA: Diagnosis not present

## 2020-01-08 DIAGNOSIS — R519 Headache, unspecified: Secondary | ICD-10-CM | POA: Insufficient documentation

## 2020-01-08 DIAGNOSIS — J45909 Unspecified asthma, uncomplicated: Secondary | ICD-10-CM | POA: Diagnosis not present

## 2020-01-08 DIAGNOSIS — R053 Chronic cough: Secondary | ICD-10-CM | POA: Diagnosis not present

## 2020-01-08 DIAGNOSIS — R079 Chest pain, unspecified: Secondary | ICD-10-CM | POA: Diagnosis not present

## 2020-01-08 DIAGNOSIS — Z87891 Personal history of nicotine dependence: Secondary | ICD-10-CM | POA: Diagnosis not present

## 2020-01-08 LAB — BASIC METABOLIC PANEL
Anion gap: 13 (ref 5–15)
BUN: 8 mg/dL (ref 8–23)
CO2: 27 mmol/L (ref 22–32)
Calcium: 9.2 mg/dL (ref 8.9–10.3)
Chloride: 100 mmol/L (ref 98–111)
Creatinine, Ser: 0.66 mg/dL (ref 0.44–1.00)
GFR calc Af Amer: >60 mL/min (ref >60)
GFR calc non Af Amer: >60 mL/min (ref >60)
Glucose, Bld: 91 mg/dL (ref 70–99)
Potassium: 3.6 mmol/L (ref 3.5–5.1)
Sodium: 140 mmol/L (ref 135–145)

## 2020-01-08 LAB — CBC WITH DIFFERENTIAL/PLATELET
Abs Immature Granulocytes: 0.02 10*3/uL (ref 0.00–0.07)
Basophils Absolute: 0 10*3/uL (ref 0.0–0.1)
Basophils Relative: 1 %
Eosinophils Absolute: 0.1 10*3/uL (ref 0.0–0.5)
Eosinophils Relative: 3 %
HCT: 36.4 % (ref 36.0–46.0)
Hemoglobin: 11.9 g/dL — ABNORMAL LOW (ref 12.0–15.0)
Immature Granulocytes: 1 %
Lymphocytes Relative: 7 %
Lymphs Abs: 0.3 10*3/uL — ABNORMAL LOW (ref 0.7–4.0)
MCH: 29.2 pg (ref 26.0–34.0)
MCHC: 32.7 g/dL (ref 30.0–36.0)
MCV: 89.2 fL (ref 80.0–100.0)
Monocytes Absolute: 0.2 10*3/uL (ref 0.1–1.0)
Monocytes Relative: 3 %
Neutro Abs: 3.7 10*3/uL (ref 1.7–7.7)
Neutrophils Relative %: 85 %
Platelets: 277 10*3/uL (ref 150–400)
RBC: 4.08 MIL/uL (ref 3.87–5.11)
RDW: 13.5 % (ref 11.5–15.5)
WBC: 4.4 10*3/uL (ref 4.0–10.5)
nRBC: 0 % (ref 0.0–0.2)

## 2020-01-08 LAB — POC OCCULT BLOOD, ED: Fecal Occult Bld: NEGATIVE

## 2020-01-08 LAB — TROPONIN I (HIGH SENSITIVITY): Troponin I (High Sensitivity): 5 ng/L (ref <18)

## 2020-01-08 MED ORDER — ALBUTEROL SULFATE HFA 108 (90 BASE) MCG/ACT IN AERS
2.0000 | INHALATION_SPRAY | RESPIRATORY_TRACT | Status: DC | PRN
  Administered 2020-01-08: 2 via RESPIRATORY_TRACT
  Filled 2020-01-08: qty 6.7

## 2020-01-08 MED ORDER — AEROCHAMBER Z-STAT PLUS/MEDIUM MISC: 1.0000 | Freq: Once | Status: AC

## 2020-01-08 MED ORDER — AEROCHAMBER Z-STAT PLUS/MEDIUM MISC
Status: AC
  Administered 2020-01-08: 1
  Filled 2020-01-08: qty 1

## 2020-01-08 MED ORDER — FLUTICASONE PROPIONATE HFA 44 MCG/ACT IN AERO: 2.0000 | INHALATION_SPRAY | Freq: Two times a day (BID) | RESPIRATORY_TRACT | 2 refills | Status: DC

## 2020-01-08 NOTE — ED Triage Notes (Signed)
Pt has stage 4 lung cancer. Last chemo tx was Friday 10/1. Pt c/o chest tightness, soreness, and pain.
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
## 2020-01-08 NOTE — ED Notes (Signed)
An After Visit Summary was printed and given to the patient. Discharge instructions given and no further questions at this time.  

## 2020-01-08 NOTE — Discharge Instructions (Signed)
You are primarily seen discussed that chest tightness and dark stools you have been having.  With regard to your chest tightness, we were able to rule out heart related problems.  I think your chest tightness is most likely related to poorly controlled asthma.  I am going to send you home with an albuterol inhaler and an inhaled steroid.  Start taking the inhaled steroid (Flovent) 2 puffs in the morning and 2 puffs in the evening.  You only need to use the albuterol if you are wheezing or having significant shortness of breath.  Please be sure to follow-up with your pulmonologist in the next week or 2.  With regard to your dark stools, did not see any evidence of blood in your stool today and it looks like your overall blood counts are improved compared to the previous measurement.  I have a very low suspicion that you are bleeding in your GI tract.

## 2020-01-08 NOTE — Telephone Encounter (Addendum)
Dr. Isidore Moos shared with me that Ms. Elizabeth Mayer called with concerns about her SRS treatment schedule being changed. Ms. Elizabeth Mayer thought that the treatment would be done the same day as her simulation planning scan. I called Ms. Elizabeth Mayer to apologize for the misunderstanding and to try and sort out her schedule with her. Ms. Elizabeth Mayer concern is that she has a hair appointment scheduled the same day as her treatment and does not feel they will be done in time to make it to the [facility] at 1:50. She states that she will also need someone in her family to help care for her that evening after the treatment, because the Ativan she takes to tolerate the mask makes her very sleepy.   I have offered to move her treatment time to later in the day on 10/6 if she is able to make arrangements for a family member to sit with her that afternoon. If that does not work, then we will look at moving the treatment to Friday 10/8 instead. Ms. Elizabeth Mayer is going to call her family to make arrangements and let me know what day or time will work best for them. At the end of our call she seemed to be happy with the plan to work around her current schedule, but would have preferred that the treatment been done the same day as her planning.   Ms. Elizabeth Mayer has my contact information to call me back to make the final arrangements for her treatment time to better accommodate her scheduling needs.   Mont Dutton R.T.(R)(T) Radiation Special Procedures Navigator

## 2020-01-08 NOTE — Telephone Encounter (Signed)
Pt reports blood in stool, "my chest is tight and I've been having pains, my head is hurting". States this has been happening all weekend. Dr Julien Nordmann wants patient to go to the ED. PT verbalized understanding

## 2020-01-09 DIAGNOSIS — Z87891 Personal history of nicotine dependence: Secondary | ICD-10-CM | POA: Insufficient documentation

## 2020-01-09 DIAGNOSIS — C7931 Secondary malignant neoplasm of brain: Secondary | ICD-10-CM | POA: Insufficient documentation

## 2020-01-09 DIAGNOSIS — C3481 Malignant neoplasm of overlapping sites of right bronchus and lung: Secondary | ICD-10-CM | POA: Diagnosis not present

## 2020-01-09 DIAGNOSIS — Z510 Encounter for antineoplastic radiation therapy: Secondary | ICD-10-CM | POA: Diagnosis not present

## 2020-01-09 DIAGNOSIS — C770 Secondary and unspecified malignant neoplasm of lymph nodes of head, face and neck: Secondary | ICD-10-CM | POA: Diagnosis not present

## 2020-01-10 ENCOUNTER — Ambulatory Visit: Payer: Managed Care, Other (non HMO) | Admitting: Radiation Oncology

## 2020-01-11 ENCOUNTER — Inpatient Hospital Stay: Payer: Managed Care, Other (non HMO)

## 2020-01-12 ENCOUNTER — Telehealth: Payer: Self-pay | Admitting: *Deleted

## 2020-01-12 ENCOUNTER — Encounter: Payer: Self-pay | Admitting: Radiation Oncology

## 2020-01-12 ENCOUNTER — Ambulatory Visit
Admission: RE | Admit: 2020-01-12 | Discharge: 2020-01-12 | Disposition: A | Discharge status: Home / Self Care | Payer: Managed Care, Other (non HMO) | Source: Ambulatory Visit | Attending: Radiation Oncology | Admitting: Radiation Oncology

## 2020-01-12 VITALS — BP 115/65 | HR 82 | Temp 97.6°F | Resp 18

## 2020-01-12 DIAGNOSIS — C7931 Secondary malignant neoplasm of brain: Secondary | ICD-10-CM

## 2020-01-12 MED ORDER — LORAZEPAM 2 MG/ML IJ SOLN
2.0000 mg | Freq: Once | INTRAMUSCULAR | Status: AC
  Administered 2020-01-12: 2 mg via INTRAMUSCULAR
  Filled 2020-01-12: qty 1

## 2020-01-12 NOTE — Telephone Encounter (Signed)
This pt is being treated for stage 4 lung CA and metastatic CA.  She needs OV.  I attempted to reach pt- she is having a procedure at this time and her brother answered the phone.  I explained how the pt will need an OV prior to procedure and asked him to tell her to call us back to scheduled an OV.

## 2020-01-12 NOTE — Progress Notes (Signed)
Mrs. Spillane rested with [place] for 30 minutes following her Optima treatment.  Patient denies headache, dizziness, nausea, diplopia or ringing in the ears. Denies fatigue. Patient without complaints. Understands to avoid strenuous activity for the next 24 hours and call [phone] with needs.  She received IM ativan prior to treatment.  She was informed that she would be a little sleepy for the next couple of hours.  She was transported out via wheelchair, her brother was waiting at the [place] stand to drive her home.  BP 115/65   Pulse 82   Temp 97.6 F (36.4 C)   Resp 18   SpO2 99%    Jeremias Broyhill M. Leonie Green, BSN

## 2020-01-12 NOTE — Op Note (Signed)
  Name: Elizabeth Mayer  MRN: [id number]  Date: 01/12/2020   DOB: Oct 01, 1960  Stereotactic Radiosurgery Operative Note  PRE-OPERATIVE DIAGNOSIS:  Multiple Brain Metastases  POST-OPERATIVE DIAGNOSIS:  Multiple Brain Metastases  PROCEDURE:  Stereotactic Radiosurgery  SURGEON:  Charlie Pitter, MD  NARRATIVE: The patient underwent a radiation treatment planning session in the radiation oncology simulation suite under the care of the radiation oncology physician and physicist.  I participated closely in the radiation treatment planning afterwards. The patient underwent planning CT which was fused to 3T high resolution MRI with 1 mm axial slices.  These images were fused on the planning system.  We contoured the gross target volumes and subsequently expanded this to yield the Planning Target Volume. I actively participated in the planning process.  I helped to define and review the target contours and also the contours of the optic pathway, eyes, brainstem and selected nearby organs at risk.  All the dose constraints for critical structures were reviewed and compared to AAPM Task Group 101.  The prescription dose conformity was reviewed.  I approved the plan electronically.    Accordingly, Elizabeth Mayer was brought to the TrueBeam stereotactic radiation treatment linac and placed in the custom immobilization mask.  The patient was aligned according to the IR fiducial markers with BrainLab Exactrac, then orthogonal x-rays were used in ExacTrac with the 6DOF robotic table and the shifts were made to align the patient  Elizabeth Mayer received stereotactic radiosurgery uneventfully.    Lesions treated:  24   Complex lesions treated:  0 (>3.5 cm, <60mm of optic path, or within the brainstem)   The detailed description of the procedure is recorded in the radiation oncology procedure note.  I was present for the duration of the procedure.  DISPOSITION:  Following delivery, the patient was transported to  nursing in stable condition and monitored for possible acute effects to be discharged to home in stable condition with follow-up in one month.  Charlie Pitter, MD 01/12/2020 12:30 PM

## 2020-01-12 NOTE — Progress Notes (Signed)
  Radiation Oncology         [phone] ________________________________  Name: Elizabeth Mayer MRN: [id number]  Date: 01/12/2020  DOB: 01-16-1961  Stereotactic Treatment Procedure Note  SPECIAL TREATMENT PROCEDURE  Outpatient    ICD-10-CM   1. Brain metastases ([facility])  C79.31 LORazepam (ATIVAN) injection 2 mg    3D TREATMENT PLANNING AND DOSIMETRY:  The patient's radiation plan was reviewed and approved by neurosurgery and radiation oncology prior to treatment.  It showed 3-dimensional radiation distributions overlaid onto the planning CT/MRI image set.  The [facility] for the target structures as well as the organs at risk were reviewed. The documentation of the 3D plan and dosimetry are filed in the radiation oncology EMR.  NARRATIVE:  Elizabeth Mayer was brought to the TrueBeam stereotactic radiation treatment machine and placed supine on the CT couch. The head frame was applied, and the patient was set up for stereotactic radiosurgery.  The patient reported that the head frame was too tight to tolerate and that she needed more lorazepam to tolerate the procedure.  The patient reported that she had taken 2 mg of lorazepam orally before arrival.  We administered an additional 2 mg of lorazepam intramuscularly with good effect.  The mass was then reapplied and tolerated well.  Neurosurgery was present for the set-up and delivery.  SIMULATION VERIFICATION:  In the couch zero-angle position, the patient underwent Exactrac imaging using the Brainlab system with orthogonal KV images.  These were carefully aligned and repeated to confirm treatment position for each of the isocenters.  The Exactrac snap film verification was repeated at each couch angle.  SPECIAL TREATMENT PROCEDURE: Elizabeth Mayer received stereotactic radiosurgery to the following targets:   ExacTrac, 9 VMAT beams, 6FFF photons Max dose=121.1% PTV1 Rt Occ 24mm 18 Gy PTV2 Rt Par 63mm 20Gy PTV3 Rt Par 45mm 20Gy PTV4 Lt Front 37mm  20Gy PTV5 Lt Front 80mm 20Gy PTV6 Rt Front 12mm 20Gy PTV7 Rt Temp 35mm 20Gy PTV8 Rt Temp 54mm 20 Gy PTV9 Rt Occ 65mm 20 Gy PTV10 Lt Par 21mm 20 Gy PTV11 Lt Par 41mm 20 Gy PTV12 Lt Par 58mm 20Gy PTV13 Rt Front 79mm 20 Gy PTV14 Rt Front 61mm 20 Gy PTV15 Rt Front 84mm 20 Gy PTV16  Rt Par 58mm 20 Gy PTV17 Lt Par 72mm 20Gy PTV18 Lt Occ 62mm 20 Gy PTV19 Rt Temp 37mm 20 Gy PTV20 Rt Par 85mm 20 Gy PTV21 Lt Par 53mm 20 Gy PTV22 Lt Par 18mm 20 Gy  ExacTrac Snap verification was performed for each couch angle.  This constitutes a special treatment procedure due to the ablative dose delivered and the technical nature of treatment.  This highly technical modality of treatment ensures that the ablative dose is centered on the patient's tumor while sparing normal tissues from excessive dose and risk of detrimental effects.  STEREOTACTIC TREATMENT MANAGEMENT:  Following delivery, the patient was transported to nursing in a wheelchair in stable condition and monitored for possible acute effects.  Vital signs were recorded BP 115/65   Pulse 82   Temp 97.6 F (36.4 C)   Resp 18   SpO2 99% . The patient tolerated treatment without significant acute effects, and was discharged to home in stable condition.    PLAN: Follow-up in one month. ________________________________   Eppie Gibson, MD

## 2020-01-15 ENCOUNTER — Ambulatory Visit: Payer: Managed Care, Other (non HMO) | Admitting: Emergency Medicine

## 2020-01-15 ENCOUNTER — Other Ambulatory Visit: Payer: Self-pay | Admitting: Radiation Oncology

## 2020-01-15 ENCOUNTER — Telehealth: Payer: Self-pay

## 2020-01-15 ENCOUNTER — Encounter: Payer: Self-pay | Admitting: Emergency Medicine

## 2020-01-15 ENCOUNTER — Other Ambulatory Visit: Payer: Self-pay

## 2020-01-15 DIAGNOSIS — C3491 Malignant neoplasm of unspecified part of right bronchus or lung: Secondary | ICD-10-CM

## 2020-01-15 DIAGNOSIS — C7931 Secondary malignant neoplasm of brain: Secondary | ICD-10-CM

## 2020-01-15 DIAGNOSIS — J449 Chronic obstructive pulmonary disease, unspecified: Secondary | ICD-10-CM | POA: Diagnosis not present

## 2020-01-15 DIAGNOSIS — C7949 Secondary malignant neoplasm of other parts of nervous system: Secondary | ICD-10-CM

## 2020-01-15 MED ORDER — BUDESONIDE-FORMOTEROL FUMARATE 80-4.5 MCG/ACT IN AERO: 2.0000 | INHALATION_SPRAY | Freq: Two times a day (BID) | RESPIRATORY_TRACT | 11 refills | Status: DC

## 2020-01-15 MED ORDER — OMEPRAZOLE 20 MG PO CPDR: 20.0000 mg | DELAYED_RELEASE_CAPSULE | Freq: Every day | ORAL | 2 refills | Status: DC

## 2020-01-15 MED ORDER — DEXAMETHASONE 4 MG PO TABS: ORAL_TABLET | ORAL | 0 refills | Status: DC

## 2020-01-15 NOTE — Assessment & Plan Note (Signed)
Follow with Dr. Isidore Moos and with Dr. Julien Nordmann as planned

## 2020-01-15 NOTE — Assessment & Plan Note (Signed)
Not on scheduled bronchodilator therapy.  She is more symptomatic.  There could be some crossover upper airway symptoms that she has a hoarse voice today but suspect that this is mainly lower airways obstruction.  She needs pulmonary function testing but I will start her on schedule therapy now and we can work on getting the physiology later.  Please start Symbicort 80/4.5 mcg, 2 puffs twice a day.  This is a scheduled medication, take it twice a day every day.  Please rinse and gargle after use this medication Keep your albuterol available to use 2 puffs if you need it for shortness of breath, chest tightness, wheezing.  You do not have to take this on a schedule, only when you need it. Follow with Dr Lamonte Sakai in 1 month or next available to review your status on the inhaler medication.

## 2020-01-15 NOTE — Telephone Encounter (Signed)
Called patient and left VM relaying Dr. Pearlie Oyster instructions on steroid prescription/administration. Encouraged patient to call office back should she have an questions/concerns, or if her symptoms worsen before she see's Dr. Isidore Moos for follow-up. Will also send patient a MyChart message to ensure information is provided another way

## 2020-01-15 NOTE — Telephone Encounter (Signed)
-----   Message from Eppie Gibson, MD sent at 01/15/2020  2:45 PM EDT ----- Regarding: RE: pt requesting refill of pain medicaion for neck pain and headache Kilynn Fitzsimmons, given her focal leptomeningeal disease, and progressive symptoms, I am concerned that her L-M may be more widespread than originally represented on her MRI - time will tell; we're rescanning her only 6 wks after SRS rather than the typical 3 months. Pt was highly motivated for SRS rather than WBRT so she could delay or avoid side effects and continue chemo (Just mentioning this so you know my thought process).  I just Rx'd Decadron for her in taper form (Take 1 tablet TID through 10-16, then 1 tablet BID through 10-30, then 1 tablet daily until you see Dr. Isidore Moos to review MRI results.)  And, omeprazole to protect for gastritis while on this.  Please let her know the Rxs will be available at [facility], that she may feel restless or have some insomnia on the steroids, but they are the best meds to address her pain/ HA.  Please ask her to call if symptoms worsen.  Thanks!  Sarah ----- Message ----- From: Zola Button, RN Sent: 01/15/2020  11:36 AM EDT To: Eppie Gibson, MD, Pincus Large, # Subject: FW: pt requesting refill of pain medicaion f#  Dr. Isidore Moos,   Patient left a VM that she feels her headache is worse since her SRS tx this past Friday. She also complains of pain behind both eyes. She would like to know if there is something she can have for pain (looks like Dr. Tammi Klippel prescribed liquid hydrocodone when Dr. Sondra Come was on vacation and she is now out of it). She doesn't currently have a decadron prescription. Are you able to call anything in for patient or is there anything you'd like me to call patient back to tell her? (Just cc'ing Dr. Julien Nordmann and Rubin Payor to keep them in the loop)  Thanks,  Zamantha Strebel ----- Message ----- From: Pincus Large Sent: 01/15/2020  11:09 AM EDT To: Zola Button, RN, De Burrs, RN Subject: pt requesting refill of pain medicaion for n#  Hello ladies, Ms. Maciver was treated on Friday with SRS. She complains of a headache with pain behind both eyes that has lessened with her pain medication but not gone away completely. She also has pain in her neck that has been managed/treated by Dr. Sondra Come. Dr. Isidore Moos [place] the MD who assisted with her SRS but Dr. Sondra Come is her original MD. To make things a little more messy, Dr. Tammi Klippel was on call and wrote a pain med Rx while Dr. Sondra Come was on vacation.  Please help Ms. Len Childs with her refill request.   Manuela Schwartz

## 2020-01-15 NOTE — Progress Notes (Signed)
Subjective:    Patient ID: Elizabeth Mayer, female    DOB: May 19, 1960, 59 y.o.   MRN: [id number]  HPI 59 year old former smoker (20 pack years), carries a history of childhood asthma. Mitral valve prolapse.  Possible CAD? Unclear evaluation, remote 40 yrs ago.  She has been evaluated by Dr. Wilburn Cornelia with ENT for left inferior neck adenopathy with associated dyspnea.  A CT scan of her neck was done 09/29/2019 which I have reviewed, shows multiple bilateral cervical nodes lymph nodes in the inferior aspect of the neck, extensive mediastinal adenopathy with areas of necrotic change. She has has exertional SOB since jan / feb. She noticed an increase in size of a 'Knot' left neck. She has now noticed a lump in her R axilla associated with some burning. No cough.   ROV 11/30/19 --follow-up visit for 59 year old former smoker with a history of childhood asthma.  I saw her in early July for new observation of cervical lymphadenopathy, extensive mediastinal adenopathy with necrotic change.  I sent her for biopsy of her cervical lymphadenopathy which was done on 11/01/2019 and showed metastatic carcinoma most consistent with lung adenocarcinoma.  PD-L1 expression 95%.  She is undergoing palliative radiotherapy to the cervical lesion and the lung mass which is encasing the right upper middle and lower lobe airways and causing postobstructive collapse.  Also on single agent immunotherapy.  She was experiencing progressive dyspnea earlier this week so a chest x-ray was performed 8/24.  I have reviewed, this shows no pleural effusion, stable right middle lobe and left lower lobe atelectasis. She continues to have dyspnea with exertion. She is coughing up clear mucous. She is unable to do her daily housework due to SOB.  She is going to undergo a sedation MRI brain on 12/05/19.   ROV 01/15/20 --59 year old woman, former smoker with childhood asthma seen for massive cervical and mediastinal lymphadenopathy.  This was  consistent with metastatic lung adenocarcinoma.  She has undergone local palliative radiotherapy to the neck and the lung, immunotherapy / chemo per Dr Isidore Moos and Dr Julien Nordmann.  MRI brain confirmed scattered metastases.  She has dyspnea, cough, both which respond to albuterol.  She was in the ED last week for escalated symptoms, was out of her albuterol at that time. Had chest tightness, SOB, wheeze. She is currently using the albuterol about every 6 hours.    Review of Systems As per HPI      Objective:   Physical Exam  Today's Vitals   01/15/20 1638  BP: 118/64  Pulse: 88  Temp: (!) 97.5 F (36.4 C)  TempSrc: Temporal  SpO2: 98%  Weight: 126 lb 12.8 oz (57.5 kg)  Height: _0  (1.651 m)   Body mass index is 21.1 kg/m.;sm  Gen: Pleasant, well-nourished, in no distress,  normal affect  ENT: No lesions,  mouth clear,  oropharynx clear, no postnasal drip, hoarse voice  Neck: No JVD, no stridor, there is a mass in the inferior left neck, smaller. Some hyperpigmented skin from XRT  Lungs: No use of accessory muscles, no crackles or wheezing on normal respiration, no wheeze on forced expiration  Cardiovascular: RRR, heart sounds normal, no murmur or gallops, no peripheral edema  Musculoskeletal: No deformities, no cyanosis or clubbing  Neuro: alert, awake, non focal  Skin: Warm, no lesions or rash     Assessment & Plan:  Asthma with COPD ([place]) Not on scheduled bronchodilator therapy.  She is more symptomatic.  There could be some crossover upper  airway symptoms that she has a hoarse voice today but suspect that this is mainly lower airways obstruction.  She needs pulmonary function testing but I will start her on schedule therapy now and we can work on getting the physiology later.  Please start Symbicort 80/4.5 mcg, 2 puffs twice a day.  This is a scheduled medication, take it twice a day every day.  Please rinse and gargle after use this medication Keep your albuterol  available to use 2 puffs if you need it for shortness of breath, chest tightness, wheezing.  You do not have to take this on a schedule, only when you need it. Follow with Dr Lamonte Sakai in 1 month or next available to review your status on the inhaler medication.  Non-small cell lung cancer, right [facility]) Follow with Dr. Isidore Moos and with Dr. Julien Nordmann as planned  Baltazar Apo, MD, PhD 01/15/2020, 5:00 PM [facility] [phone] or if no answer [phone]

## 2020-01-15 NOTE — Telephone Encounter (Signed)
CAlled Pt- Lm to call us to make OV with SA or APP- Lelan Pons pV

## 2020-01-15 NOTE — Patient Instructions (Signed)
Please start Symbicort 80/4.5 mcg, 2 puffs twice a day.  This is a scheduled medication, take it twice a day every day.  Please rinse and gargle after use this medication Keep your albuterol available to use 2 puffs if you need it for shortness of breath, chest tightness, wheezing.  You do not have to take this on a schedule, only when you need it. Follow with Dr. Isidore Moos and with Dr. Julien Nordmann as planned Follow with Dr Lamonte Sakai in 1 month or next available to review your status on the inhaler medication.

## 2020-01-18 ENCOUNTER — Telehealth: Payer: Self-pay | Admitting: Radiation Therapy

## 2020-01-18 ENCOUNTER — Other Ambulatory Visit: Payer: Self-pay | Admitting: Physician Assistant

## 2020-01-18 ENCOUNTER — Other Ambulatory Visit: Payer: Self-pay | Admitting: Radiation Oncology

## 2020-01-18 ENCOUNTER — Ambulatory Visit: Payer: Managed Care, Other (non HMO) | Admitting: Podiatry

## 2020-01-18 ENCOUNTER — Other Ambulatory Visit: Payer: Self-pay

## 2020-01-18 ENCOUNTER — Encounter: Payer: Self-pay | Admitting: Podiatry

## 2020-01-18 ENCOUNTER — Inpatient Hospital Stay (HOSPITAL_BASED_OUTPATIENT_CLINIC_OR_DEPARTMENT_OTHER): Payer: Managed Care, Other (non HMO) | Admitting: Internal Medicine

## 2020-01-18 DIAGNOSIS — C3491 Malignant neoplasm of unspecified part of right bronchus or lung: Secondary | ICD-10-CM | POA: Diagnosis not present

## 2020-01-18 DIAGNOSIS — L600 Ingrowing nail: Secondary | ICD-10-CM

## 2020-01-18 DIAGNOSIS — E876 Hypokalemia: Secondary | ICD-10-CM

## 2020-01-18 LAB — CMP (CANCER CENTER ONLY)
ALT: 15 U/L (ref 0–44)
AST: 19 U/L (ref 15–41)
Albumin: 3.4 g/dL — ABNORMAL LOW (ref 3.5–5.0)
Alkaline Phosphatase: 40 U/L (ref 38–126)
Anion gap: 8 (ref 5–15)
BUN: 9 mg/dL (ref 8–23)
CO2: 28 mmol/L (ref 22–32)
Calcium: 9.5 mg/dL (ref 8.9–10.3)
Chloride: 105 mmol/L (ref 98–111)
Creatinine: 0.68 mg/dL (ref 0.44–1.00)
GFR, Estimated: >60 mL/min (ref >60)
Glucose, Bld: 100 mg/dL — ABNORMAL HIGH (ref 70–99)
Potassium: 2.9 mmol/L — CL (ref 3.5–5.1)
Sodium: 141 mmol/L (ref 135–145)
Total Bilirubin: <0.2 mg/dL — ABNORMAL LOW (ref 0.3–1.2)
Total Protein: 7.1 g/dL (ref 6.5–8.1)

## 2020-01-18 LAB — CBC WITH DIFFERENTIAL (CANCER CENTER ONLY)
Abs Immature Granulocytes: 0.01 10*3/uL (ref 0.00–0.07)
Basophils Absolute: 0 10*3/uL (ref 0.0–0.1)
Basophils Relative: 0 %
Eosinophils Absolute: 0 10*3/uL (ref 0.0–0.5)
Eosinophils Relative: 0 %
HCT: 28.9 % — ABNORMAL LOW (ref 36.0–46.0)
Hemoglobin: 9.3 g/dL — ABNORMAL LOW (ref 12.0–15.0)
Immature Granulocytes: 0 %
Lymphocytes Relative: 12 %
Lymphs Abs: 0.3 10*3/uL — ABNORMAL LOW (ref 0.7–4.0)
MCH: 28.2 pg (ref 26.0–34.0)
MCHC: 32.2 g/dL (ref 30.0–36.0)
MCV: 87.6 fL (ref 80.0–100.0)
Monocytes Absolute: 0.3 10*3/uL (ref 0.1–1.0)
Monocytes Relative: 12 %
Neutro Abs: 1.7 10*3/uL (ref 1.7–7.7)
Neutrophils Relative %: 76 %
Platelet Count: 78 10*3/uL — ABNORMAL LOW (ref 150–400)
RBC: 3.3 MIL/uL — ABNORMAL LOW (ref 3.87–5.11)
RDW: 13.1 % (ref 11.5–15.5)
WBC Count: 2.2 10*3/uL — ABNORMAL LOW (ref 4.0–10.5)
nRBC: 0 % (ref 0.0–0.2)

## 2020-01-18 LAB — TSH: TSH: 0.3 u[IU]/mL — ABNORMAL LOW (ref 0.308–3.960)

## 2020-01-18 MED ORDER — NEOMYCIN-POLYMYXIN-HC 3.5-10000-1 OT SOLN: OTIC | 1 refills | Status: DC

## 2020-01-18 MED ORDER — POTASSIUM CHLORIDE 20 MEQ/15ML (10%) PO SOLN: 20.0000 meq | Freq: Two times a day (BID) | ORAL | 0 refills | Status: DC

## 2020-01-18 MED ORDER — HYDROCODONE-ACETAMINOPHEN 7.5-325 MG/15ML PO SOLN
10.0000 mL | Freq: Two times a day (BID) | ORAL | 0 refills | Status: DC
Start: 2020-01-18 — End: 2021-02-07

## 2020-01-18 MED ORDER — POTASSIUM CHLORIDE CRYS ER 20 MEQ PO TBCR: 20.0000 meq | EXTENDED_RELEASE_TABLET | Freq: Two times a day (BID) | ORAL | 0 refills | Status: DC

## 2020-01-18 NOTE — Progress Notes (Signed)
Subjective:   Patient ID: Elizabeth Mayer, female   DOB: 59 y.o.   MRN: [id number]   HPI Patient presents stating she has had chronic ingrown toenails of her big toes bilateral and they are getting worse as time goes on and states it hard for her to wear shoe gear comfortably.  She is currently having chemo for cancer but overall is doing pretty well and patient does not smoke and is trying to be active   Review of Systems  All other systems reviewed and are negative.       Objective:  Physical Exam Vitals and nursing note reviewed.  Constitutional:      Appearance: She is well-developed.  Pulmonary:     Effort: Pulmonary effort is normal.  Musculoskeletal:        General: Normal range of motion.  Skin:    General: Skin is warm.  Neurological:     Mental Status: She is alert.     Neurovascular status found to be intact muscle strength was found to be adequate range of motion within normal limits.  Patient was found to have incurvated nail borders hallux bilateral both medial lateral borders that are painful when pressed and slight irritation of remaining nails secondary to pressure.  Patient is found to have good digital perfusion well oriented x3 and does have muscle weakness of the mild nature secondary to the treatment she is undergoing     Assessment:  Ingrown toenail deformity hallux bilateral medial lateral borders with pain with other nails moderately involved     Plan:  H&P reviewed conditions at great length.  I do think nail corner removal could be done and I explained procedure and risk and she wants to go under this treatment at this point.  I did discuss other treatments we may need to consider but at this point we will work on this and I infiltrated each hallux 60 mg like Marcaine mixture sterile prep done and using sterile instrumentation I remove the medial borders of the hallux exposed matrix applied phenol 3 applications 30 seconds to each border followed by  alcohol lavage and sterile dressing.  Gave instructions on soaks reappoint and encouraged to call with questions concerns

## 2020-01-18 NOTE — Telephone Encounter (Signed)
I returned the patient's call  to let her know Dr. Sondra Come is sending in the refill for her pain medication later today. The Rx should be ready for her to pick up tomorrow morning from her pharmacy.    Mont Dutton R.T.(R)(T) Radiation Special Procedures Navigator

## 2020-01-18 NOTE — Patient Instructions (Signed)
Place 1/4 cup of epsom salts in a quart of warm tap water.  Submerge your foot or feet in the solution and soak for 20 minutes.  This soak should be done twice a day.  Next, remove your foot or feet from solution, blot dry the affected area. Apply ointment and cover if instructed by your doctor.   IF YOUR SKIN BECOMES IRRITATED WHILE USING THESE INSTRUCTIONS, IT IS OKAY TO SWITCH TO  WHITE VINEGAR AND WATER.  As another alternative soak, you may use antibacterial soap and water.  Monitor for any signs/symptoms of infection. Call the office immediately if any occur or go directly to the emergency room. Call with any questions/concerns.  Ingrown Toenail An ingrown toenail occurs when the corner or sides of a toenail grow into the surrounding skin. This causes discomfort and pain. The big toe is most commonly affected, but any of the toes can be affected. If an ingrown toenail is not treated, it can become infected. What are the causes? This condition may be caused by:  Wearing shoes that are too small or tight.  An injury, such as stubbing your toe or having your toe stepped on.  Improper cutting or care of your toenails.  Having nail or foot abnormalities that were present from birth (congenital abnormalities), such as having a nail that is too big for your toe. What increases the risk? The following factors may make you more likely to develop ingrown toenails:  Age. Nails tend to get thicker with age, so ingrown nails are more common among older people.  Cutting your toenails incorrectly, such as cutting them very short or cutting them unevenly. An ingrown toenail is more likely to get infected if you have:  Diabetes.  Blood flow (circulation) problems. What are the signs or symptoms? Symptoms of an ingrown toenail may include:  Pain, soreness, or tenderness.  Redness.  Swelling.  Hardening of the skin that surrounds the toenail. Signs that an ingrown toenail may be infected  include:  Fluid or pus.  Symptoms that get worse instead of better. How is this diagnosed? An ingrown toenail may be diagnosed based on your medical history, your symptoms, and a physical exam. If you have fluid or blood coming from your toenail, a sample may be collected to test for the specific type of bacteria that is causing the infection. How is this treated? Treatment depends on how severe your ingrown toenail is. You may be able to care for your toenail at home.  If you have an infection, you may be prescribed antibiotic medicines.  If you have fluid or pus draining from your toenail, your health care provider may drain it.  If you have trouble walking, you may be given crutches to use.  If you have a severe or infected ingrown toenail, you may need a procedure to remove part or all of the nail. Follow these instructions at home: Foot care   Do not pick at your toenail or try to remove it yourself.  Soak your foot in warm, soapy water. Do this for 20 minutes, 3 times a day, or as often as told by your health care provider. This helps to keep your toe clean and keep your skin soft.  Wear shoes that fit well and are not too tight. Your health care provider may recommend that you wear open-toed shoes while you heal.  Trim your toenails regularly and carefully. Cut your toenails straight across to prevent injury to the skin at the   corners of the toenail. Do not cut your nails in a curved shape.  Keep your feet clean and dry to help prevent infection. Medicines  Take over-the-counter and prescription medicines only as told by your health care provider.  If you were prescribed an antibiotic, take it as told by your health care provider. Do not stop taking the antibiotic even if you start to feel better. Activity  Return to your normal activities as told by your health care provider. Ask your health care provider what activities are safe for you.  Avoid activities that cause  pain. General instructions  If your health care provider told you to use crutches to help you move around, use them as instructed.  Keep all follow-up visits as told by your health care provider. This is important. Contact a health care provider if:  You have more redness, swelling, pain, or other symptoms that do not improve with treatment.  You have fluid, blood, or pus coming from your toenail. Get help right away if:  You have a red streak on your skin that starts at your foot and spreads up your leg.  You have a fever. Summary  An ingrown toenail occurs when the corner or sides of a toenail grow into the surrounding skin. This causes discomfort and pain. The big toe is most commonly affected, but any of the toes can be affected.  If an ingrown toenail is not treated, it can become infected.  Fluid or pus draining from your toenail is a sign of infection. Your health care provider may need to drain it. You may be given antibiotics to treat the infection.  Trimming your toenails regularly and properly can help you prevent an ingrown toenail. This information is not intended to replace advice given to you by your health care provider. Make sure you discuss any questions you have with your health care provider. Document Revised: 07/15/2018 Document Reviewed: 12/09/2016 Elsevier Patient Education  2020 [employer]  

## 2020-01-22 ENCOUNTER — Telehealth: Payer: Self-pay | Admitting: Emergency Medicine

## 2020-01-22 ENCOUNTER — Telehealth: Payer: Self-pay | Admitting: Adult Health

## 2020-01-22 DIAGNOSIS — G4734 Idiopathic sleep related nonobstructive alveolar hypoventilation: Secondary | ICD-10-CM

## 2020-01-22 NOTE — Telephone Encounter (Signed)
ONO showed + desats.  Please begin O2 @ 2L [facility]. Called and spoke with patient, advised of above results and the need to start oxygen at night.  Order sent to [facility].  Nothing further needed.

## 2020-01-22 NOTE — Progress Notes (Signed)
[facility] OFFICE PROGRESS NOTE  Minette Brine, [place] [address] [address]  DIAGNOSIS: Stage IV non-small cell lung cancer, most consistent with adenocarcinoma.The patient presented with a right perihilar lung mass which encases and occludes the right lower lobe pulmonary artery. There is also associated case meant of the right upper lobe, right middle lobe, and right lower lobe bronchi. The polyp patient also has associated bilateral mediastinal, right axillary, cervical, and supraclavicular lymphadenopathy. She also has metastatic disease to the brain. The patient was diagnosed in July 2021  PDL1 Expression: 95%  Biomarker Findings Tumor Mutational Burden - 16 Muts/Mb Microsatellite status - MS-Stable Genomic Findings For a complete list of the genes assayed, please refer to the Appendix. KRAS G12V PTEN loss exon 2 KDM6A deletion exons 6-21 TERT promoter -124C>T TP53 V274F 7 Disease relevant genes with no reportable alterations: ALK, BRAF, EGFR, ERBB2, MET, RET, ROS1  Tumor Proportion Score (TPS) (95%)  PRIOR THERAPY: 1) Palliative radiotherapy to the lung mass which is causing encasement of the right upper lobe, right middle lobe, and right lower lobe as well as palliative radiotherapy to the painful cervical adenopathy under the care of Dr. Sondra Come. Last dose of radiation on 12/04/2019 2) SRS to the brain metastases under the care of Dr. Isidore Moos. Treatment expected on 01/03/20 and 01/10/20.  CURRENT THERAPY: 1) Palliative systemic chemotherapy with carboplatin for an AUC of 5, Alimta 500 mg per metered squared, Keytruda 200 mg IV every 3 weeks. First dose on 12/12/19.Status post 2 cycles.   INTERVAL HISTORY: Elizabeth Mayer 59 y.o. female returns to the clinic today for a follow-up visit.  The patient is feeling fair today without any concerning complaints except for taste alterations.  The patient recently completed SRS to the  numerous brain lesions under the care of Dr. Isidore Moos.  She is currently on a tapering dose of Decadron.  Due to her extensive brain metastases, Dr. Isidore Moos is planning on rescanning her brain in 6 weeks as opposed to the typical 3 months.  Her repeat brain MRI scan is tentatively schedule for 02/20/20.  Additionally, the patient recently saw Dr. Lamonte Sakai from pulmonology.  He arranged for her to use oxygen via nasal cannula at nighttime.  She also received a inhaler for Symbicort.  This has been helpful to her and she reports that her breathing is improving. She stated "I can breath better".  The patient also had very bulky adenopathy in the chest and neck.  The patient was endorsing dysphagia and was treated for thrush and Carafate.  A referral was placed to gastroenterology. Her appointment needed to be reschedule and is scheduled for 02/21/20. Her dysphagia has improved. She reports that her mouth is "all white"; however, no significant thrush present on exam today.  Her appetite is improving and she gained weight since her last appointment.   Otherwise, she denies any fever or night sweats.  She denies any hemoptysis. She reports some chest tightness with deep breathing. She went to the ER a few weeks ago for this concern. Cardiac workup unremarkable. She denies nausea or vomiting.  She denies any diarrhea constipation. She denies headaches or visual changes except for 1 episode of headache. The patient notes that it is challenging obtaining venous access for her lab draws. The patient is here today for evaluation before starting cycle #3.  MEDICAL HISTORY: Past Medical History:  Diagnosis Date  . Anemia   . Angina 1982   related to stress  .  Asthma    in the past, not now- never inhlaer use  . Cancer ([place])    lung  . Complication of anesthesia    states anesthesia made her hair fall out  . Constipation   . GERD (gastroesophageal reflux disease)   . Heart murmur 1970s  . Ingrown toenail   .  Past heart attack 1980-1981   pt states she passed out and woke up in hospital- told she had heart attack, but then dr said he couldn't find anything wrong.  . Pneumonia     ALLERGIES:  is allergic to pseudoephedrine, gabapentin, mobic [meloxicam], and penicillins.  MEDICATIONS:  Current Outpatient Medications  Medication Sig Dispense Refill  . acetaminophen (TYLENOL) 500 MG tablet Take 1,000 mg by mouth every 6 (six) hours as needed for moderate pain.     Marland Kitchen albuterol (VENTOLIN HFA) 108 (90 Base) MCG/ACT inhaler Inhale 2 puffs into the lungs every 6 (six) hours as needed for wheezing or shortness of breath. 18 g 6  . Artificial Saliva (BIOTENE DRY MOUTH MOISTURIZING) SOLN Use as directed 1 Dose in the mouth or throat as needed (dry mouth).    . budesonide-formoterol (SYMBICORT) 80-4.5 MCG/ACT inhaler Inhale 2 puffs into the lungs in the morning and at bedtime. 1 each 11  . Carboxymethylcellul-Glycerin (LUBRICATING EYE DROPS OP) Place 1 drop into both eyes daily as needed (irritation).    . Cream Base (WOUND CARE) CREA Apply 1 application topically as needed (wound care).    Marland Kitchen dexamethasone (DECADRON) 4 MG tablet Take 1 tablet TID through 10-16, then 1 tablet BID through 10-30, then 1 tablet daily until you see Dr. Isidore Moos to review MRI results. 80 tablet 0  . DIGESTIVE ENZYMES PO Take 0.5-1 tablets by mouth 3 (three) times daily before meals.    . docusate sodium (COLACE) 100 MG capsule Take 300 mg by mouth daily.    . fexofenadine (ALLEGRA) 180 MG tablet Take 180 mg by mouth at bedtime.    . fluticasone (FLONASE) 50 MCG/ACT nasal spray Place 2 sprays into both nostrils daily.    . fluticasone (FLOVENT HFA) 44 MCG/ACT inhaler Inhale 2 puffs into the lungs 2 (two) times daily. 1 each 2  . folic acid (FOLVITE) 1 MG tablet Take 1 tablet (1 mg total) by mouth daily. 30 tablet 4  . guaiFENesin (MUCINEX) 600 MG 12 hr tablet Take 600 mg by mouth 2 (two) times daily.     Marland Kitchen HYDROcodone-acetaminophen  (HYCET) 7.5-325 mg/15 ml solution Take 10 mLs by mouth 2 (two) times daily. For pain from cancer and radiation reactions 473 mL 0  . HYDROcodone-acetaminophen (NORCO/VICODIN) 5-325 MG tablet Take 1-2 tablets by mouth every 6 (six) hours as needed for moderate pain. 60 tablet 0  . lidocaine (XYLOCAINE) 2 % solution Use as directed 15 mLs in the mouth or throat as needed for mouth pain. Swallow 30 min prior to meals and at bedtime for throat/esophagus pain 250 mL 1  . LORazepam (ATIVAN) 2 MG tablet Take 1 tab 30 min before radiation treatment to the brain. 1 tablet 0  . neomycin-bacitracin-polymyxin (NEOSPORIN) ointment Apply 1 application topically daily as needed for wound care.    . neomycin-polymyxin-hydrocortisone (CORTISPORIN) OTIC solution Apply 1-2 drops to toe after soaking BID 10 mL 1  . omeprazole (PRILOSEC) 20 MG capsule Take 1 capsule (20 mg total) by mouth daily. Take as long as you are on dexamethasone to protect from stomach irritation. 30 capsule 2  . ondansetron (ZOFRAN  ODT) 8 MG disintegrating tablet Take 1 tablet (8 mg total) by mouth every 8 (eight) hours as needed for nausea or vomiting. 30 tablet 3  . ondansetron (ZOFRAN) 4 MG tablet Take 1 tablet (4 mg total) by mouth every 8 (eight) hours as needed for nausea or vomiting. 20 tablet 0  . orphenadrine (NORFLEX) 100 MG tablet Take 1 tablet (100 mg total) by mouth 2 (two) times daily as needed for muscle spasms. 60 tablet 1  . potassium chloride 20 MEQ/15ML (10%) SOLN Take 15 mLs (20 mEq total) by mouth 2 (two) times daily. 210 mL 0  . potassium chloride SA (KLOR-CON) 20 MEQ tablet Take 1 tablet (20 mEq total) by mouth 2 (two) times daily. 14 tablet 0  . prochlorperazine (COMPAZINE) 10 MG tablet Take 1 tablet (10 mg total) by mouth every 6 (six) hours as needed. 30 tablet 2  . sucralfate (CARAFATE) 1 g tablet Take 1 tablet (1 g total) by mouth 4 (four) times daily -  with meals and at bedtime. 90 tablet 2  . lidocaine-prilocaine  (EMLA) cream Apply 1 application topically as needed. 30 g 2  . LORazepam (ATIVAN) 0.5 MG tablet Please take 1 tablet 30 minutes before your scan as needed for anxiety 2 tablet 0   No current facility-administered medications for this visit.    SURGICAL HISTORY:  Past Surgical History:  Procedure Laterality Date  . ingrown toe nail surgery    . NECK SURGERY  2000   Disc removed from neck   . RADIOLOGY WITH ANESTHESIA N/A 12/05/2019   Procedure: MRI BRAIN WITH AND WITHOUT CONTRAST;  Surgeon: Radiologist, Medication, MD;  Location: [facility];  Service: Radiology;  Laterality: N/A;  . RADIOLOGY WITH ANESTHESIA N/A 01/02/2020   Procedure: MRI BRAIN WITH AND WITHOUT CONTRAST;  Surgeon: Radiologist, Medication, MD;  Location: [facility];  Service: Radiology;  Laterality: N/A;    REVIEW OF SYSTEMS:   Review of Systems  Constitutional: Negative for appetite change, chills, fatigue, fever and unexpected weight change.  HENT: Positive for taste alterations. Negative for mouth sores, nosebleeds, sore throat and trouble swallowing.   Eyes: Negative for eye problems and icterus.  Respiratory: positive for improving but persistent shortness of breath, orthopnea, and cough. Negative for hemoptysis and wheezing.   Cardiovascular: Positive for occasional chest tightness. Negative for leg swelling.  Gastrointestinal: Negative for abdominal pain, constipation, diarrhea, nausea and vomiting.  Genitourinary: Negative for bladder incontinence, difficulty urinating, dysuria, frequency and hematuria.   Musculoskeletal: Negative for back pain, gait problem, neck pain and neck stiffness.  Skin: Negative for itching and rash.  Neurological: Negative for dizziness, extremity weakness, gait problem, headaches, light-headedness and seizures.  Hematological: Positive for improving adenopathy in the left cervical region. Does not bruise/bleed easily.  Psychiatric/Behavioral: Negative for confusion, depression and sleep  disturbance. The patient is not nervous/anxious.     PHYSICAL EXAMINATION:  Blood pressure 130/67, pulse 78, temperature (!) 97.4 F (36.3 C), temperature source Tympanic, resp. rate 18, height _0  (1.651 m), weight 132 lb 1.6 oz (59.9 kg), SpO2 100 %.  ECOG PERFORMANCE STATUS: 1 - Symptomatic but completely ambulatory  Physical Exam  Constitutional: Oriented to person, place, and time and well-developed, well-nourished, and in no distress.  HENT:  Head: Normocephalic and atraumatic.  Mouth/Throat: Oropharynx is clear and moist.No significant thrush visualized on exam today.  Eyes:Ptosis noted on left eyelid.Conjunctivae are normal. Right eye exhibits no discharge. Left eye exhibits no discharge. No scleral icterus.  Neck:Bilateral  cervical/supraclavicular adenopathy (L>R) improving. Range of motion limited by large neck mass.Neck supple.  Cardiovascular: Normal rate, regular rhythm, normal heart sounds and intact distal pulses.  Pulmonary/Chest: Effort normal. Decreased breath sounds in right lung.No respiratory distress. No wheezes. No rales.  Abdominal: Soft. Bowel sounds are normal. Exhibits no distension and no mass. There is no tenderness.  Musculoskeletal: Normal range of motion. Exhibits no edema.  Lymphadenopathy:  Right axillary, bilateral cervical and supraclavicular adenopathy (improving)  Neurological: Alert and oriented to person, place, and time. Exhibits normal muscle tone. Gait normal. Coordination normal.  Skin: Skin is warm and dry. No rash noted. Not diaphoretic. No erythema. No pallor.  Psychiatric: Mood, memory and judgment normal.  Vitals reviewed.  LABORATORY DATA: Lab Results  Component Value Date   WBC 7.3 01/25/2020   HGB 10.6 (L) 01/25/2020   HCT 32.9 (L) 01/25/2020   MCV 91.1 01/25/2020   PLT 218 01/25/2020      Chemistry      Component Value Date/Time   NA 141 01/25/2020 1116   NA 139 07/31/2019 1518   K 4.2 01/25/2020 1116   CL 105  01/25/2020 1116   CO2 29 01/25/2020 1116   BUN 11 01/25/2020 1116   BUN 8 07/31/2019 1518   CREATININE 0.72 01/25/2020 1116      Component Value Date/Time   CALCIUM 9.3 01/25/2020 1116   ALKPHOS 41 01/25/2020 1116   AST 18 01/25/2020 1116   ALT 26 01/25/2020 1116   BILITOT <0.2 (L) 01/25/2020 1116       RADIOGRAPHIC STUDIES:  MR Brain W Wo Contrast  Result Date: 01/02/2020 CLINICAL DATA:  Brain/CNS neoplasm, surveillance. Stage IV non-small cell lung cancer. EXAM: MRI HEAD WITHOUT AND WITH CONTRAST TECHNIQUE: Multiplanar, multiecho pulse sequences of the brain and surrounding structures were obtained without and with intravenous contrast. CONTRAST:  26m GADAVIST GADOBUTROL 1 MMOL/ML IV SOLN COMPARISON:  MRI of the brain December 05, 2019. FINDINGS: Brain: No acute infarction, hemorrhage, hydrocephalus or extra-axial collection. Multiple enhancing lesions are seen scattered throughout the brain parenchyma, consistent with metastatic disease to the brain. Some of the smaller lesions may have not been identified on prior MRI due to differences in technique. Lesions described prior (series 9): 1. Posterior right parietal lobe with leptomeningeal extension, 9 mm (8 mm on prior), image 97. Leptomeningeal extension better appreciated on series 10, image 11. 2. Right precentral gyrus, 2 mm (279mon prior), image 117. 3. Right superior frontal gyrus, 9 mm, (8 mm on prior), image 123. 4. Left superior frontal gyrus, 9 mm, (5 mm on prior), image 132. Additional lesions (series 9): 1. Right pre frontal gyrus, 3 mm, image 130. 2. Right temporal lobe, 7 mm, image 64. 3. Left paracentral lobule, 5 mm, image 112. 4. At least 10 punctate (1-2 mm) enhancing lesions are seen scattered in the bilateral cerebral hemispheres. Vascular: Normal flow voids. Skull and upper cervical spine: Normal marrow signal. Mild mucosal thickening Sinuses/Orbits: Mild mucosal thickening of the frontal and ethmoid sinuses. The orbits are  maintained. Other: None. IMPRESSION: 1. Multiple enhancing lesions scattered throughout the brain parenchyma, consistent with metastatic disease to the brain with multiple new lesions identified in the current study. 2. Leptomeningeal extension of disease appreciated in at least one lesion. These results will be called to the ordering clinician or representative by the Radiologist Assistant, and communication documented in the PACS or ClFrontier Oil CorporationElectronically Signed   By: KaPedro Earls.D.   On: 01/02/2020  18:53     ASSESSMENT/PLAN:  This is a very pleasant 28 year oldAfrican Americanfemale diagnosed withmetastatic non-small cell lung cancer, most consistent with adenocarcinoma.The patient presented with a right perihilar lung mass which encases and occludes the right lower lobe pulmonary artery. There is also associatedencasementof the right upper lobe, right middle lobe, and right lower lobe bronchi. The patient also has associated bilateral mediastinal, right axillary, cervical, and supraclavicular lymphadenopathy. The patient was diagnosed in July 2021. Her PD-L1 expression is 95%. Her foundation 1 molecular studies show no actionable mutations  The patientcompletedpalliative radiotherapy to the bulky cervical lymphadenopathy and the obstructive mass which is encasing the right upper lobe, right middle lobe, and right lower lobe. She is receiving radiation under the care of Dr. Sondra Come. She receivedher last dose of radiotherapy on 12/04/2019.  She completed SRS to the numerous brain metastases under the care of Dr. Isidore Moos in October 2021.   The patient was given the option of single agent immunotherapy versus starting palliative systemic chemotherapy/immunotherapy.The patient decided to pursue systemic palliative chemotherapy/immunotherapy with carboplatin for an AUC of 5, Alimta 500 mg per metered squared, Keytruda 200 mg IV every 3 weeks. She is status post 2  cycles.   Labs were reviewed. Recommend that she proceed with cycle #3 today as scheduled.   I will arrange for a restaging CT scan of the neck, chest, abdomen, and pelvis prior to starting her next cycle of treatment. Due to there claustrophobia, I have sent 2 tablets of ativan 0.5 mg to take before her scan if needed.   We will see her back for a follow up visit 3 weeks for evaluation and consideration of starting cycle #4.   Due to the poor venous access, I will arrange for the patient to have a port-a-cath placed. I have sent emla cream to the pharmacy.   No significant evidence of thrush appreciated on exam. I encouraged her to use salt water rinses and biotene for the taste alterations.   The patient inquired if she should continue taking her folic acid supplement despite taking a multivitamin. I discussed she should take her prescribed folic acid as this is a higher dose compared to her multivitamin.   The patient was advised to call immediately if she has any concerning symptoms in the interval. The patient voices understanding of current disease status and treatment options and is in agreement with the current care plan. All questions were answered. The patient knows to call the clinic with any problems, questions or concerns. We can certainly see the patient much sooner if necessary       Orders Placed This Encounter  Procedures  . IR Perc Tun Perit Cath W/Port    Standing Status:   Future    Standing Expiration Date:   01/24/2021    Order Specific Question:   Reason for exam:    Answer:   For chemotherapy. She has bulky disease on the left so the right side is probably better.    Order Specific Question:   Preferred Imaging Location?    Answer:   [facility]  . CT Chest W Contrast    Standing Status:   Future    Standing Expiration Date:   01/24/2021    Order Specific Question:   If indicated for the ordered procedure, I authorize the administration of contrast  media per Radiology protocol    Answer:   Yes    Order Specific Question:   Preferred imaging location?  Answer:   [facility]  . CT Abdomen Pelvis W Contrast    Standing Status:   Future    Standing Expiration Date:   01/24/2021    Order Specific Question:   If indicated for the ordered procedure, I authorize the administration of contrast media per Radiology protocol    Answer:   Yes    Order Specific Question:   Preferred imaging location?    Answer:   [facility]    Order Specific Question:   Is Oral Contrast requested for this exam?    Answer:   Yes, Per Radiology protocol  . CT Soft Tissue Neck W Contrast    Standing Status:   Future    Standing Expiration Date:   01/24/2021    Order Specific Question:   If indicated for the ordered procedure, I authorize the administration of contrast media per Radiology protocol    Answer:   Yes    Order Specific Question:   Preferred imaging location?    Answer:   [place], [facility] 01/25/20

## 2020-01-22 NOTE — Telephone Encounter (Signed)
Spoke with the pt and notified of recs per Dr Lamonte Sakai  She verbalized understanding  Will come tomorrow to pick up spacer  Aware to ask for help on someone showing her how to use

## 2020-01-22 NOTE — Telephone Encounter (Signed)
Spoke with patient, she stated that she was not initially instructed to rinse her mouth after using her albuterol and no everything taste bad to her.  She had tried brushing her teeth and rinsing with mouth wash.  Please advise.  Thank you.

## 2020-01-22 NOTE — Telephone Encounter (Signed)
Albuterol is not typically one of the meds that causes thrush. She may benefit from rinsing after using as she has said. I think we should try to get her a spacer and show her how to use this. Will help minimize deposition in her mouth.

## 2020-01-24 ENCOUNTER — Telehealth: Payer: Self-pay | Admitting: Radiation Therapy

## 2020-01-24 NOTE — Telephone Encounter (Signed)
I called to check in on Ms. Elizabeth Mayer. She reports that her headache has improved with the steroids Dr. Isidore Moos prescribed and her appetite is much better, which she is happy about. Her hair has started falling out in some places from the radiation treatment, she is not too happy about this, but overall understood this was a possibility.      I asked her about a complaint she shared with Dr. Agustina Caroli office, concerning her mouth tasting bad. I informed her that the steroids she has been taking could cause thrush, and asked if she had noticed any white patches on her tongue or the inside of her mouth. She has not paid attention to this, but plans to check it out the next time she is brushing her teeth and let me know if she does.   Overall Elizabeth Mayer is doing well and knows to call me if she has concerns.   Mont Dutton R.T.(R)(T) Radiation Special Procedures Navigator

## 2020-01-25 ENCOUNTER — Inpatient Hospital Stay: Payer: Managed Care, Other (non HMO)

## 2020-01-25 ENCOUNTER — Inpatient Hospital Stay (HOSPITAL_BASED_OUTPATIENT_CLINIC_OR_DEPARTMENT_OTHER): Payer: Managed Care, Other (non HMO) | Admitting: Physician Assistant

## 2020-01-25 ENCOUNTER — Inpatient Hospital Stay: Payer: Managed Care, Other (non HMO) | Admitting: Nutrition

## 2020-01-25 ENCOUNTER — Other Ambulatory Visit: Payer: Self-pay | Admitting: Internal Medicine

## 2020-01-25 ENCOUNTER — Other Ambulatory Visit: Payer: Self-pay

## 2020-01-25 ENCOUNTER — Encounter: Payer: Self-pay | Admitting: Physician Assistant

## 2020-01-25 VITALS — BP 130/67 | HR 78 | Temp 97.4°F | Resp 18 | Ht 65.0 in | Wt 132.1 lb

## 2020-01-25 DIAGNOSIS — Z5111 Encounter for antineoplastic chemotherapy: Secondary | ICD-10-CM

## 2020-01-25 DIAGNOSIS — C7931 Secondary malignant neoplasm of brain: Secondary | ICD-10-CM

## 2020-01-25 DIAGNOSIS — C3491 Malignant neoplasm of unspecified part of right bronchus or lung: Secondary | ICD-10-CM

## 2020-01-25 DIAGNOSIS — Z5112 Encounter for antineoplastic immunotherapy: Secondary | ICD-10-CM | POA: Diagnosis not present

## 2020-01-25 DIAGNOSIS — C7949 Secondary malignant neoplasm of other parts of nervous system: Secondary | ICD-10-CM

## 2020-01-25 LAB — CBC WITH DIFFERENTIAL (CANCER CENTER ONLY)
Abs Immature Granulocytes: 0.3 10*3/uL — ABNORMAL HIGH (ref 0.00–0.07)
Basophils Absolute: 0 10*3/uL (ref 0.0–0.1)
Basophils Relative: 0 %
Eosinophils Absolute: 0 10*3/uL (ref 0.0–0.5)
Eosinophils Relative: 0 %
HCT: 32.9 % — ABNORMAL LOW (ref 36.0–46.0)
Hemoglobin: 10.6 g/dL — ABNORMAL LOW (ref 12.0–15.0)
Immature Granulocytes: 4 %
Lymphocytes Relative: 7 %
Lymphs Abs: 0.5 10*3/uL — ABNORMAL LOW (ref 0.7–4.0)
MCH: 29.4 pg (ref 26.0–34.0)
MCHC: 32.2 g/dL (ref 30.0–36.0)
MCV: 91.1 fL (ref 80.0–100.0)
Monocytes Absolute: 1.3 10*3/uL — ABNORMAL HIGH (ref 0.1–1.0)
Monocytes Relative: 18 %
Neutro Abs: 5.1 10*3/uL (ref 1.7–7.7)
Neutrophils Relative %: 71 %
Platelet Count: 218 10*3/uL (ref 150–400)
RBC: 3.61 MIL/uL — ABNORMAL LOW (ref 3.87–5.11)
RDW: 16.7 % — ABNORMAL HIGH (ref 11.5–15.5)
WBC Count: 7.3 10*3/uL (ref 4.0–10.5)
nRBC: 0 % (ref 0.0–0.2)

## 2020-01-25 LAB — CMP (CANCER CENTER ONLY)
ALT: 26 U/L (ref 0–44)
AST: 18 U/L (ref 15–41)
Albumin: 3.4 g/dL — ABNORMAL LOW (ref 3.5–5.0)
Alkaline Phosphatase: 41 U/L (ref 38–126)
Anion gap: 7 (ref 5–15)
BUN: 11 mg/dL (ref 8–23)
CO2: 29 mmol/L (ref 22–32)
Calcium: 9.3 mg/dL (ref 8.9–10.3)
Chloride: 105 mmol/L (ref 98–111)
Creatinine: 0.72 mg/dL (ref 0.44–1.00)
GFR, Estimated: >60 mL/min (ref >60)
Glucose, Bld: 78 mg/dL (ref 70–99)
Potassium: 4.2 mmol/L (ref 3.5–5.1)
Sodium: 141 mmol/L (ref 135–145)
Total Bilirubin: <0.2 mg/dL — ABNORMAL LOW (ref 0.3–1.2)
Total Protein: 6.6 g/dL (ref 6.5–8.1)

## 2020-01-25 MED ORDER — SODIUM CHLORIDE 0.9 % IV SOLN
500.0000 mg/m2 | Freq: Once | INTRAVENOUS | Status: AC
  Administered 2020-01-25: 800 mg via INTRAVENOUS
  Filled 2020-01-25: qty 20

## 2020-01-25 MED ORDER — SODIUM CHLORIDE 0.9 % IV SOLN
10.0000 mg | Freq: Once | INTRAVENOUS | Status: AC
  Administered 2020-01-25: 10 mg via INTRAVENOUS
  Filled 2020-01-25: qty 10

## 2020-01-25 MED ORDER — CYANOCOBALAMIN 1000 MCG/ML IJ SOLN
INTRAMUSCULAR | Status: AC
  Filled 2020-01-25: qty 1

## 2020-01-25 MED ORDER — SODIUM CHLORIDE 0.9 % IV SOLN
484.5000 mg | Freq: Once | INTRAVENOUS | Status: AC
  Administered 2020-01-25: 480 mg via INTRAVENOUS
  Filled 2020-01-25: qty 48

## 2020-01-25 MED ORDER — LORAZEPAM 0.5 MG PO TABS: ORAL_TABLET | ORAL | 0 refills | Status: DC

## 2020-01-25 MED ORDER — PALONOSETRON HCL INJECTION 0.25 MG/5ML
INTRAVENOUS | Status: AC
  Filled 2020-01-25: qty 5

## 2020-01-25 MED ORDER — SODIUM CHLORIDE 0.9 % IV SOLN
200.0000 mg | Freq: Once | INTRAVENOUS | Status: AC
  Administered 2020-01-25: 200 mg via INTRAVENOUS
  Filled 2020-01-25: qty 8

## 2020-01-25 MED ORDER — LIDOCAINE-PRILOCAINE 2.5-2.5 % EX CREA: 1.0000 "application " | TOPICAL_CREAM | CUTANEOUS | 2 refills | Status: DC | PRN

## 2020-01-25 MED ORDER — PALONOSETRON HCL INJECTION 0.25 MG/5ML
0.2500 mg | Freq: Once | INTRAVENOUS | Status: AC
  Administered 2020-01-25: 0.25 mg via INTRAVENOUS

## 2020-01-25 MED ORDER — SODIUM CHLORIDE 0.9 % IV SOLN
Freq: Once | INTRAVENOUS | Status: AC
  Filled 2020-01-25: qty 250

## 2020-01-25 MED ORDER — CYANOCOBALAMIN 1000 MCG/ML IJ SOLN
1000.0000 ug | Freq: Once | INTRAMUSCULAR | Status: AC
  Administered 2020-01-25: 1000 ug via INTRAMUSCULAR

## 2020-01-25 MED ORDER — SODIUM CHLORIDE 0.9 % IV SOLN
150.0000 mg | Freq: Once | INTRAVENOUS | Status: AC
  Administered 2020-01-25: 150 mg via INTRAVENOUS
  Filled 2020-01-25: qty 150

## 2020-01-25 NOTE — Patient Instructions (Signed)
[facility] Discharge Instructions for Patients Receiving Chemotherapy  Today you received the following chemotherapy agents: pembrolizumab, pemetrexed, and carboplatin.  To help prevent nausea and vomiting after your treatment, we encourage you to take your nausea medication as directed.   If you develop nausea and vomiting that is not controlled by your nausea medication, call the clinic.   BELOW ARE SYMPTOMS THAT SHOULD BE REPORTED IMMEDIATELY:  *FEVER GREATER THAN 100.5 F  *CHILLS WITH OR WITHOUT FEVER  NAUSEA AND VOMITING THAT IS NOT CONTROLLED WITH YOUR NAUSEA MEDICATION  *UNUSUAL SHORTNESS OF BREATH  *UNUSUAL BRUISING OR BLEEDING  TENDERNESS IN MOUTH AND THROAT WITH OR WITHOUT PRESENCE OF ULCERS  *URINARY PROBLEMS  *BOWEL PROBLEMS  UNUSUAL RASH Items with * indicate a potential emergency and should be followed up as soon as possible.  Feel free to call the clinic should you have any questions or concerns. The clinic phone number is [phone].  Please show the CHEMO ALERT CARD at check-in to the Emergency Department and triage nurse.   

## 2020-01-25 NOTE — Progress Notes (Signed)
59 year old female diagnosed with non-small cell lung cancer and brain mets metastases in July 2021.  Past medical history includes GERD, MI, and constipation.  Medications include Colace, Ativan, Zofran, Compazine, and Carafate.  Labs include albumin 3.4.  Height: 5 feet 5 inches. Weight: 132.1 pounds. Usual body weight: 157 pounds in April 2021. BMI: 21.98.  Patient reports she is having taste alterations.  She describes food as being bland without much taste. She denies mouth sores. She endorses weight loss.  Noted 16% weight loss from usual body weight in 6 months which is significant.  Nutrition diagnosis: Unintended weight loss related to lung cancer and associated treatments as evidenced by 16% weight loss from usual body weight.  Intervention: Educated patient on importance of increasing calories and protein in small frequent meals and snacks. Recommended high-calorie, high-protein foods.  Suggested patient consume minimum of 1 oral nutrition supplement daily.  Provided samples. Educated on strategies for improving taste alterations.  Encouraged baking soda and salt water rinses. Fact sheets provided.  Contact information given.  Questions were answered and teach back method used.  Monitoring, evaluation, goals: Patient will tolerate adequate calories and protein to minimize further weight loss.  Next visit: Thursday, November 11 during infusion.  **Disclaimer: This note was dictated with voice recognition software. Similar sounding words can inadvertently be transcribed and this note may contain transcription errors which may not have been corrected upon publication of note.**

## 2020-02-01 ENCOUNTER — Inpatient Hospital Stay: Payer: Managed Care, Other (non HMO)

## 2020-02-01 ENCOUNTER — Other Ambulatory Visit: Payer: Self-pay

## 2020-02-01 DIAGNOSIS — C3491 Malignant neoplasm of unspecified part of right bronchus or lung: Secondary | ICD-10-CM

## 2020-02-01 LAB — CBC WITH DIFFERENTIAL (CANCER CENTER ONLY)
Abs Immature Granulocytes: 0.02 10*3/uL (ref 0.00–0.07)
Basophils Absolute: 0 10*3/uL (ref 0.0–0.1)
Basophils Relative: 0 %
Eosinophils Absolute: 0 10*3/uL (ref 0.0–0.5)
Eosinophils Relative: 1 %
HCT: 32.4 % — ABNORMAL LOW (ref 36.0–46.0)
Hemoglobin: 10.4 g/dL — ABNORMAL LOW (ref 12.0–15.0)
Immature Granulocytes: 0 %
Lymphocytes Relative: 14 %
Lymphs Abs: 0.8 10*3/uL (ref 0.7–4.0)
MCH: 29.5 pg (ref 26.0–34.0)
MCHC: 32.1 g/dL (ref 30.0–36.0)
MCV: 91.8 fL (ref 80.0–100.0)
Monocytes Absolute: 0.3 10*3/uL (ref 0.1–1.0)
Monocytes Relative: 5 %
Neutro Abs: 4.4 10*3/uL (ref 1.7–7.7)
Neutrophils Relative %: 80 %
Platelet Count: 187 10*3/uL (ref 150–400)
RBC: 3.53 MIL/uL — ABNORMAL LOW (ref 3.87–5.11)
RDW: 16.7 % — ABNORMAL HIGH (ref 11.5–15.5)
WBC Count: 5.5 10*3/uL (ref 4.0–10.5)
nRBC: 0 % (ref 0.0–0.2)

## 2020-02-01 LAB — CMP (CANCER CENTER ONLY)
ALT: 46 U/L — ABNORMAL HIGH (ref 0–44)
AST: 27 U/L (ref 15–41)
Albumin: 3.4 g/dL — ABNORMAL LOW (ref 3.5–5.0)
Alkaline Phosphatase: 39 U/L (ref 38–126)
Anion gap: 7 (ref 5–15)
BUN: 14 mg/dL (ref 8–23)
CO2: 31 mmol/L (ref 22–32)
Calcium: 9.1 mg/dL (ref 8.9–10.3)
Chloride: 101 mmol/L (ref 98–111)
Creatinine: 0.73 mg/dL (ref 0.44–1.00)
GFR, Estimated: >60 mL/min (ref >60)
Glucose, Bld: 80 mg/dL (ref 70–99)
Potassium: 4.2 mmol/L (ref 3.5–5.1)
Sodium: 139 mmol/L (ref 135–145)
Total Bilirubin: 0.5 mg/dL (ref 0.3–1.2)
Total Protein: 6.3 g/dL — ABNORMAL LOW (ref 6.5–8.1)

## 2020-02-01 NOTE — Telephone Encounter (Signed)
Pt cancelled both colonoscopy and PV

## 2020-02-02 ENCOUNTER — Encounter: Payer: Managed Care, Other (non HMO) | Admitting: Gastroenterology

## 2020-02-08 ENCOUNTER — Other Ambulatory Visit: Payer: Self-pay | Admitting: Internal Medicine

## 2020-02-08 ENCOUNTER — Other Ambulatory Visit: Payer: Self-pay

## 2020-02-08 ENCOUNTER — Inpatient Hospital Stay: Payer: Managed Care, Other (non HMO) | Attending: Internal Medicine

## 2020-02-08 ENCOUNTER — Other Ambulatory Visit: Payer: Self-pay | Admitting: Radiology

## 2020-02-08 DIAGNOSIS — C3491 Malignant neoplasm of unspecified part of right bronchus or lung: Secondary | ICD-10-CM

## 2020-02-08 DIAGNOSIS — Z452 Encounter for adjustment and management of vascular access device: Secondary | ICD-10-CM | POA: Insufficient documentation

## 2020-02-08 DIAGNOSIS — C7931 Secondary malignant neoplasm of brain: Secondary | ICD-10-CM | POA: Diagnosis not present

## 2020-02-08 DIAGNOSIS — Z9981 Dependence on supplemental oxygen: Secondary | ICD-10-CM | POA: Diagnosis not present

## 2020-02-08 DIAGNOSIS — R5383 Other fatigue: Secondary | ICD-10-CM | POA: Diagnosis not present

## 2020-02-08 DIAGNOSIS — Z79899 Other long term (current) drug therapy: Secondary | ICD-10-CM | POA: Insufficient documentation

## 2020-02-08 DIAGNOSIS — Z5111 Encounter for antineoplastic chemotherapy: Secondary | ICD-10-CM | POA: Insufficient documentation

## 2020-02-08 DIAGNOSIS — C3401 Malignant neoplasm of right main bronchus: Secondary | ICD-10-CM | POA: Diagnosis not present

## 2020-02-08 DIAGNOSIS — Z5112 Encounter for antineoplastic immunotherapy: Secondary | ICD-10-CM | POA: Insufficient documentation

## 2020-02-08 LAB — CMP (CANCER CENTER ONLY)
ALT: 122 U/L — ABNORMAL HIGH (ref 0–44)
AST: 73 U/L — ABNORMAL HIGH (ref 15–41)
Albumin: 3.5 g/dL (ref 3.5–5.0)
Alkaline Phosphatase: 52 U/L (ref 38–126)
Anion gap: 9 (ref 5–15)
BUN: 15 mg/dL (ref 8–23)
CO2: 28 mmol/L (ref 22–32)
Calcium: 8.7 mg/dL — ABNORMAL LOW (ref 8.9–10.3)
Chloride: 105 mmol/L (ref 98–111)
Creatinine: 0.82 mg/dL (ref 0.44–1.00)
GFR, Estimated: >60 mL/min (ref >60)
Glucose, Bld: 95 mg/dL (ref 70–99)
Potassium: 3.6 mmol/L (ref 3.5–5.1)
Sodium: 142 mmol/L (ref 135–145)
Total Bilirubin: 0.3 mg/dL (ref 0.3–1.2)
Total Protein: 6.7 g/dL (ref 6.5–8.1)

## 2020-02-08 LAB — CBC WITH DIFFERENTIAL (CANCER CENTER ONLY)
Abs Immature Granulocytes: 0.16 10*3/uL — ABNORMAL HIGH (ref 0.00–0.07)
Basophils Absolute: 0 10*3/uL (ref 0.0–0.1)
Basophils Relative: 1 %
Eosinophils Absolute: 0 10*3/uL (ref 0.0–0.5)
Eosinophils Relative: 1 %
HCT: 34.7 % — ABNORMAL LOW (ref 36.0–46.0)
Hemoglobin: 11.2 g/dL — ABNORMAL LOW (ref 12.0–15.0)
Immature Granulocytes: 3 %
Lymphocytes Relative: 6 %
Lymphs Abs: 0.4 10*3/uL — ABNORMAL LOW (ref 0.7–4.0)
MCH: 29.7 pg (ref 26.0–34.0)
MCHC: 32.3 g/dL (ref 30.0–36.0)
MCV: 92 fL (ref 80.0–100.0)
Monocytes Absolute: 0.4 10*3/uL (ref 0.1–1.0)
Monocytes Relative: 7 %
Neutro Abs: 5.2 10*3/uL (ref 1.7–7.7)
Neutrophils Relative %: 82 %
Platelet Count: UNDETERMINED 10*3/uL (ref 150–400)
RBC: 3.77 MIL/uL — ABNORMAL LOW (ref 3.87–5.11)
RDW: 18.9 % — ABNORMAL HIGH (ref 11.5–15.5)
WBC Count: 6.2 10*3/uL (ref 4.0–10.5)
nRBC: 0.3 % — ABNORMAL HIGH (ref 0.0–0.2)

## 2020-02-08 LAB — TSH: TSH: 1.048 u[IU]/mL (ref 0.308–3.960)

## 2020-02-09 ENCOUNTER — Ambulatory Visit (HOSPITAL_COMMUNITY): Payer: Managed Care, Other (non HMO)

## 2020-02-09 ENCOUNTER — Other Ambulatory Visit: Payer: Self-pay | Admitting: Internal Medicine

## 2020-02-09 ENCOUNTER — Other Ambulatory Visit (HOSPITAL_COMMUNITY): Payer: Managed Care, Other (non HMO)

## 2020-02-09 ENCOUNTER — Telehealth: Payer: Self-pay | Admitting: Emergency Medicine

## 2020-02-09 DIAGNOSIS — C3491 Malignant neoplasm of unspecified part of right bronchus or lung: Secondary | ICD-10-CM

## 2020-02-09 NOTE — Telephone Encounter (Signed)
Called and spoke with patient who states that she does not have any form of portable oxygen and she was denied for POC. Patient states that her shortness of breath is a little worse and that she has been wearing her oxygen during the day as well. Patient only has orders for O2 at night that's why she was denied per Estill Bamberg at [facility].   Let patient know that she needs to do a walk in the office at her next [place] on 11/22 to see if she qualifies for O2 for daytime. She expressed understanding. Nothing further needed at this time.

## 2020-02-09 NOTE — Telephone Encounter (Signed)
Elizabeth Mayer is returning phone call. States does not need a call back. Would like for our office to call the patient. Elizabeth Mayer phone number is [phone].

## 2020-02-09 NOTE — Telephone Encounter (Signed)
LMTCB at Elizabeth Mayer's extension

## 2020-02-12 ENCOUNTER — Ambulatory Visit (HOSPITAL_COMMUNITY): Payer: Managed Care, Other (non HMO)

## 2020-02-12 ENCOUNTER — Encounter (HOSPITAL_COMMUNITY): Payer: Self-pay

## 2020-02-13 ENCOUNTER — Other Ambulatory Visit (HOSPITAL_COMMUNITY): Payer: Managed Care, Other (non HMO)

## 2020-02-13 ENCOUNTER — Telehealth: Payer: Self-pay

## 2020-02-13 ENCOUNTER — Telehealth: Payer: Self-pay | Admitting: Physician Assistant

## 2020-02-13 ENCOUNTER — Other Ambulatory Visit: Payer: Self-pay | Admitting: Physician Assistant

## 2020-02-13 DIAGNOSIS — C3491 Malignant neoplasm of unspecified part of right bronchus or lung: Secondary | ICD-10-CM

## 2020-02-13 NOTE — Telephone Encounter (Signed)
Pt LM requesting a return call.  I spoke with pt and she advised she was supposed to be prescribed something for her anxiety as a pre-med for her CT Scan. Pt was advised Ativan was sent 01/25/20 and the pharmacy confirmed receipt at 12:30pm on the same day. Pts pharmacy was confirmed to be correct. Pt states she will follow-up with [facility] about this.  Pt also wants to move her 02/15/20 appt to the following week.  I confirmed with Cassie PA-C this is okay and sent a schedule message to move the appts. Pt was made aware of this.  Lastly pt has ger CT scans scheduled with [facility] and is in the process of obtaining a ride to the appt.

## 2020-02-13 NOTE — Telephone Encounter (Signed)
I received a message that the patient's upcoming CT scans were not approved to be performed at [facility] radiology. I called the patient and left a voicemail. I instructed her to call [place] [facility] [phone]) and to schedule her scan at the earliest availability. If she had any questions about the instructions, she was encouraged to call us back.

## 2020-02-14 ENCOUNTER — Other Ambulatory Visit: Payer: Self-pay | Admitting: Radiology

## 2020-02-14 ENCOUNTER — Telehealth: Payer: Self-pay | Admitting: Internal Medicine

## 2020-02-14 ENCOUNTER — Ambulatory Visit: Payer: Managed Care, Other (non HMO) | Admitting: Nurse Practitioner

## 2020-02-14 NOTE — Telephone Encounter (Signed)
Rescheduled appt per 11/09 sch msg - pt is aware of apt date and time

## 2020-02-15 ENCOUNTER — Inpatient Hospital Stay: Payer: Managed Care, Other (non HMO)

## 2020-02-15 ENCOUNTER — Inpatient Hospital Stay: Payer: Managed Care, Other (non HMO) | Admitting: Internal Medicine

## 2020-02-15 ENCOUNTER — Inpatient Hospital Stay: Payer: Managed Care, Other (non HMO) | Admitting: Nutrition

## 2020-02-16 ENCOUNTER — Other Ambulatory Visit: Payer: Self-pay

## 2020-02-16 ENCOUNTER — Other Ambulatory Visit: Payer: Self-pay | Admitting: Physician Assistant

## 2020-02-16 ENCOUNTER — Encounter (HOSPITAL_COMMUNITY): Payer: Self-pay

## 2020-02-16 ENCOUNTER — Ambulatory Visit (HOSPITAL_COMMUNITY)
Admission: RE | Admit: 2020-02-16 | Discharge: 2020-02-16 | Disposition: A | Discharge status: Home / Self Care | Payer: Managed Care, Other (non HMO) | Source: Ambulatory Visit

## 2020-02-16 ENCOUNTER — Ambulatory Visit (HOSPITAL_COMMUNITY)
Admission: RE | Admit: 2020-02-16 | Discharge: 2020-02-16 | Disposition: A | Discharge status: Home / Self Care | Payer: Managed Care, Other (non HMO) | Source: Ambulatory Visit | Attending: Physician Assistant | Admitting: Physician Assistant

## 2020-02-16 DIAGNOSIS — Z20822 Contact with and (suspected) exposure to covid-19: Secondary | ICD-10-CM | POA: Diagnosis not present

## 2020-02-16 DIAGNOSIS — K219 Gastro-esophageal reflux disease without esophagitis: Secondary | ICD-10-CM | POA: Diagnosis not present

## 2020-02-16 DIAGNOSIS — Z79899 Other long term (current) drug therapy: Secondary | ICD-10-CM | POA: Diagnosis not present

## 2020-02-16 DIAGNOSIS — R1310 Dysphagia, unspecified: Secondary | ICD-10-CM | POA: Insufficient documentation

## 2020-02-16 DIAGNOSIS — C3490 Malignant neoplasm of unspecified part of unspecified bronchus or lung: Secondary | ICD-10-CM | POA: Insufficient documentation

## 2020-02-16 DIAGNOSIS — Z87891 Personal history of nicotine dependence: Secondary | ICD-10-CM | POA: Insufficient documentation

## 2020-02-16 DIAGNOSIS — C3491 Malignant neoplasm of unspecified part of right bronchus or lung: Secondary | ICD-10-CM

## 2020-02-16 HISTORY — PX: IR IMAGING GUIDED PORT INSERTION: IMG5740

## 2020-02-16 LAB — CBC WITH DIFFERENTIAL/PLATELET
Abs Immature Granulocytes: 0.25 10*3/uL — ABNORMAL HIGH (ref 0.00–0.07)
Basophils Absolute: 0 10*3/uL (ref 0.0–0.1)
Basophils Relative: 0 %
Eosinophils Absolute: 0 10*3/uL (ref 0.0–0.5)
Eosinophils Relative: 0 %
HCT: 34 % — ABNORMAL LOW (ref 36.0–46.0)
Hemoglobin: 10.6 g/dL — ABNORMAL LOW (ref 12.0–15.0)
Immature Granulocytes: 4 %
Lymphocytes Relative: 5 %
Lymphs Abs: 0.3 10*3/uL — ABNORMAL LOW (ref 0.7–4.0)
MCH: 30.8 pg (ref 26.0–34.0)
MCHC: 31.2 g/dL (ref 30.0–36.0)
MCV: 98.8 fL (ref 80.0–100.0)
Monocytes Absolute: 0.3 10*3/uL (ref 0.1–1.0)
Monocytes Relative: 5 %
Neutro Abs: 6.2 10*3/uL (ref 1.7–7.7)
Neutrophils Relative %: 86 %
Platelets: 158 10*3/uL (ref 150–400)
RBC: 3.44 MIL/uL — ABNORMAL LOW (ref 3.87–5.11)
RDW: 21.2 % — ABNORMAL HIGH (ref 11.5–15.5)
WBC: 7.1 10*3/uL (ref 4.0–10.5)
nRBC: 0 % (ref 0.0–0.2)

## 2020-02-16 LAB — PROTIME-INR
INR: 0.9 (ref 0.8–1.2)
Prothrombin Time: 11.6 seconds (ref 11.4–15.2)

## 2020-02-16 MED ORDER — HEPARIN SOD (PORK) LOCK FLUSH 100 UNIT/ML IV SOLN
INTRAVENOUS | Status: AC
  Filled 2020-02-16: qty 5

## 2020-02-16 MED ORDER — CLINDAMYCIN PHOSPHATE 900 MG/50ML IV SOLN: 900.0000 mg | Freq: Once | INTRAVENOUS | Status: AC

## 2020-02-16 MED ORDER — FENTANYL CITRATE (PF) 100 MCG/2ML IJ SOLN
INTRAMUSCULAR | Status: AC | PRN
Start: 2020-02-16 — End: 2020-02-16
  Administered 2020-02-16 (×2): 25 ug via INTRAVENOUS

## 2020-02-16 MED ORDER — FENTANYL CITRATE (PF) 100 MCG/2ML IJ SOLN
INTRAMUSCULAR | Status: AC
  Filled 2020-02-16: qty 2

## 2020-02-16 MED ORDER — MIDAZOLAM HCL 2 MG/2ML IJ SOLN
INTRAMUSCULAR | Status: AC | PRN
  Administered 2020-02-16 (×2): 1 mg via INTRAVENOUS

## 2020-02-16 MED ORDER — MIDAZOLAM HCL 2 MG/2ML IJ SOLN
INTRAMUSCULAR | Status: AC
  Filled 2020-02-16: qty 2

## 2020-02-16 MED ORDER — LIDOCAINE-EPINEPHRINE 1 %-1:100000 IJ SOLN
INTRAMUSCULAR | Status: AC
  Filled 2020-02-16: qty 1

## 2020-02-16 MED ORDER — LIDOCAINE-EPINEPHRINE 1 %-1:100000 IJ SOLN
INTRAMUSCULAR | Status: AC | PRN
  Administered 2020-02-16: 10 mL

## 2020-02-16 MED ORDER — SODIUM CHLORIDE 0.9 % IV SOLN: INTRAVENOUS | Status: DC

## 2020-02-16 MED ORDER — CLINDAMYCIN PHOSPHATE 900 MG/50ML IV SOLN
INTRAVENOUS | Status: AC
  Administered 2020-02-16: 900 mg via INTRAVENOUS
  Filled 2020-02-16: qty 50

## 2020-02-16 NOTE — H&P (Signed)
Chief Complaint: Chemotherapy access. Request is for portacath placement  Referring Physician(s): C. [place] PA  Supervising Physician: Jacqulynn Cadet  Patient Status: [facility] - Out-pt  History of Present Illness: Elizabeth Mayer is a 59 y.o. female History of GERD, metastatic non small cell lung cancer s/p radiotherapy with encasement of the right lower lobe pulmonary artery and bulky adenopathy to the chest and neck with dysphagia.    Past Medical History:  Diagnosis Date  . Anemia   . Angina 1982   related to stress  . Asthma    in the past, not now- never inhlaer use  . Cancer ([place])    lung  . Complication of anesthesia    states anesthesia made her hair fall out  . Constipation   . GERD (gastroesophageal reflux disease)   . Heart murmur 1970s  . Ingrown toenail   . Past heart attack 1980-1981   pt states she passed out and woke up in hospital- told she had heart attack, but then dr said he couldn't find anything wrong.  . Pneumonia     Past Surgical History:  Procedure Laterality Date  . ingrown toe nail surgery    . NECK SURGERY  2000   Disc removed from neck   . RADIOLOGY WITH ANESTHESIA N/A 12/05/2019   Procedure: MRI BRAIN WITH AND WITHOUT CONTRAST;  Surgeon: Radiologist, Medication, MD;  Location: [place];  Service: Radiology;  Laterality: N/A;  . RADIOLOGY WITH ANESTHESIA N/A 01/02/2020   Procedure: MRI BRAIN WITH AND WITHOUT CONTRAST;  Surgeon: Radiologist, Medication, MD;  Location: [place];  Service: Radiology;  Laterality: N/A;    Allergies: Pseudoephedrine, Gabapentin, Mobic [meloxicam], and Penicillins  Medications: Prior to Admission medications   Medication Sig Start Date End Date Taking? Authorizing Provider  acetaminophen (TYLENOL) 500 MG tablet Take 1,000 mg by mouth every 6 (six) hours as needed for moderate pain.     [provider]  albuterol (VENTOLIN HFA) 108 (90 Base) MCG/ACT inhaler Inhale 2 puffs into the lungs every 6  (six) hours as needed for wheezing or shortness of breath. 12/15/19   Collene Gobble, MD  Artificial Saliva (BIOTENE DRY MOUTH MOISTURIZING) SOLN Use as directed 1 Dose in the mouth or throat as needed (dry mouth).    [provider]  budesonide-formoterol (SYMBICORT) 80-4.5 MCG/ACT inhaler Inhale 2 puffs into the lungs in the morning and at bedtime. 01/15/20   Collene Gobble, MD  Carboxymethylcellul-Glycerin (LUBRICATING EYE DROPS OP) Place 1 drop into both eyes daily as needed (irritation).    [provider]  Cream Base (WOUND CARE) CREA Apply 1 application topically as needed (wound care).    [provider]  dexamethasone (DECADRON) 4 MG tablet Take 1 tablet TID through 10-16, then 1 tablet BID through 10-30, then 1 tablet daily until you see Dr. Isidore Moos to review MRI results. Patient taking differently: Take 4 mg by mouth daily.  01/15/20   Eppie Gibson, MD  DIGESTIVE ENZYMES PO Take 0.5-1 tablets by mouth 3 (three) times daily before meals.    [provider]  docusate sodium (COLACE) 100 MG capsule Take 100 mg by mouth daily as needed for mild constipation.     [provider]  fexofenadine (ALLEGRA) 180 MG tablet Take 180 mg by mouth at bedtime.    [provider]  fluticasone (FLONASE) 50 MCG/ACT nasal spray Place 2 sprays into both nostrils daily.    [provider]  fluticasone (FLOVENT HFA) 44  MCG/ACT inhaler Inhale 2 puffs into the lungs 2 (two) times daily. 01/08/20 01/07/21  Matilde Haymaker, MD  folic acid (FOLVITE) 1 MG tablet Take 1 tablet (1 mg total) by mouth daily. 12/06/19   Curt Bears, MD  guaiFENesin (MUCINEX) 600 MG 12 hr tablet Take 600 mg by mouth 2 (two) times daily.     [provider]  HYDROcodone-acetaminophen (HYCET) 7.5-325 mg/15 ml solution Take 10 mLs by mouth 2 (two) times daily. For pain from cancer and radiation reactions 01/18/20   Gery Pray, MD  HYDROcodone-acetaminophen (NORCO/VICODIN)  5-325 MG tablet Take 1-2 tablets by mouth every 6 (six) hours as needed for moderate pain. Patient not taking: Reported on 02/14/2020 11/14/19   Minette Brine, FNP  lidocaine (XYLOCAINE) 2 % solution Use as directed 15 mLs in the mouth or throat as needed for mouth pain. Swallow 30 min prior to meals and at bedtime for throat/esophagus pain 12/18/19   Gery Pray, MD  lidocaine-prilocaine (EMLA) cream Apply 1 application topically as needed. Patient taking differently: Apply 1 application topically as needed (port access).  01/25/20   Heilingoetter, Cassandra L, PA-C  LORazepam (ATIVAN) 0.5 MG tablet Please take 1 tablet 30 minutes before your scan as needed for anxiety 01/25/20   Heilingoetter, Cassandra L, PA-C  LORazepam (ATIVAN) 2 MG tablet Take 1 tab 30 min before radiation treatment to the brain. Patient not taking: Reported on 02/14/2020 01/04/20   Eppie Gibson, MD  Multiple Vitamin (MULTIVITAMIN WITH MINERALS) TABS tablet Take 1 tablet by mouth daily.    [provider]  neomycin-bacitracin-polymyxin (NEOSPORIN) ointment Apply 1 application topically daily as needed for wound care.    [provider]  neomycin-polymyxin-hydrocortisone (CORTISPORIN) OTIC solution Apply 1-2 drops to toe after soaking BID Patient taking differently: 1-2 drops See admin instructions. Apply 1-2 drops to toe after soaking twice daily 01/18/20   Wallene Huh, DPM  omeprazole (PRILOSEC) 20 MG capsule Take 1 capsule (20 mg total) by mouth daily. Take as long as you are on dexamethasone to protect from stomach irritation. 01/15/20   Eppie Gibson, MD  ondansetron (ZOFRAN ODT) 8 MG disintegrating tablet Take 1 tablet (8 mg total) by mouth every 8 (eight) hours as needed for nausea or vomiting. 12/18/19   Heilingoetter, Cassandra L, PA-C  ondansetron (ZOFRAN) 4 MG tablet Take 1 tablet (4 mg total) by mouth every 8 (eight) hours as needed for nausea or vomiting. 11/20/19   Gery Pray, MD  orphenadrine  (NORFLEX) 100 MG tablet Take 1 tablet (100 mg total) by mouth 2 (two) times daily as needed for muscle spasms. 11/13/19 11/12/20  Minette Brine, FNP  potassium chloride 20 MEQ/15ML (10%) SOLN Take 15 mLs (20 mEq total) by mouth 2 (two) times daily. Patient taking differently: Take 20 mEq by mouth daily.  01/18/20   Heilingoetter, Cassandra L, PA-C  potassium chloride SA (KLOR-CON) 20 MEQ tablet Take 1 tablet (20 mEq total) by mouth 2 (two) times daily. Patient not taking: Reported on 02/14/2020 01/18/20   Heilingoetter, Cassandra L, PA-C  prochlorperazine (COMPAZINE) 10 MG tablet Take 1 tablet (10 mg total) by mouth every 6 (six) hours as needed. Patient taking differently: Take 10 mg by mouth every 6 (six) hours as needed for nausea or vomiting.  11/21/19   Heilingoetter, Cassandra L, PA-C  sucralfate (CARAFATE) 1 g tablet Take 1 tablet (1 g total) by mouth 4 (four) times daily -  with meals and at bedtime. Patient not taking: Reported on 02/14/2020 12/25/19  Heilingoetter, Cassandra L, PA-C     Family History  Problem Relation Age of Onset  . Heart failure Father     Social History   Socioeconomic History  . Marital status: Married    Spouse name: Not on file  . Number of children: Not on file  . Years of education: Not on file  . Highest education level: Not on file  Occupational History  . Not on file  Tobacco Use  . Smoking status: Former Smoker    Packs/day: 1.00    Years: 20.00    Pack years: 20.00    Types: Cigarettes    Start date: 84    Quit date: 2001    Years since quitting: 20.8  . Smokeless tobacco: Never Used  Vaping Use  . Vaping Use: Never used  Substance and Sexual Activity  . Alcohol use: No  . Drug use: No  . Sexual activity: Yes    Birth control/protection: None  Other Topics Concern  . Not on file  Social History Narrative  . Not on file   Social Determinants of Health   Financial Resource Strain: High Risk  . Difficulty of Paying Living Expenses:  Hard  Food Insecurity: No Food Insecurity  . Worried About Charity fundraiser in the Last Year: Never true  . Ran Out of Food in the Last Year: Never true  Transportation Needs: No Transportation Needs  . Lack of Transportation (Medical): No  . Lack of Transportation (Non-Medical): No  Physical Activity: Inactive  . Days of Exercise per Week: 0 days  . Minutes of Exercise per Session: 0 min  Stress:   . Feeling of Stress : Not on file  Social Connections: Moderately Isolated  . Frequency of Communication with Friends and Family: Twice a week  . Frequency of Social Gatherings with Friends and Family: Twice a week  . Attends Religious Services: More than 4 times per year  . Active Member of Clubs or Organizations: No  . Attends Archivist Meetings: Not on file  . Marital Status: Separated    Review of Systems: A 12 point ROS discussed and pertinent positives are indicated in the HPI above.  All other systems are negative.  Review of Systems  Constitutional: Negative for fatigue and fever.  HENT: Positive for sore throat and voice change. Negative for congestion.   Respiratory: Negative for cough and shortness of breath.        States she can lay flat with oxygen.  Gastrointestinal: Negative for abdominal pain, diarrhea, nausea and vomiting.    Vital Signs: There were no vitals taken for this visit.  Physical Exam Vitals and nursing note reviewed.  Constitutional:      Appearance: She is well-developed.  HENT:     Head: Normocephalic and atraumatic.     Mouth/Throat:     Comments: Dysphonia noted.  Eyes:     Conjunctiva/sclera: Conjunctivae normal.  Neck:     Comments: Lymphadenopathy noted to left side of the neck and chest wall.  Cardiovascular:     Rate and Rhythm: Normal rate and regular rhythm.     Heart sounds: Normal heart sounds.  Pulmonary:     Breath sounds: Normal breath sounds.     Comments: On [place]. Musculoskeletal:        General: Normal range  of motion.     Cervical back: Normal range of motion.  Skin:    General: Skin is warm.  Neurological:  Mental Status: She is alert and oriented to person, place, and time.     Imaging: No results found.  Labs:  CBC: Recent Labs    01/25/20 1116 02/01/20 0908 02/08/20 0909 02/16/20 1344  WBC 7.3 5.5 6.2 7.1  HGB 10.6* 10.4* 11.2* 10.6*  HCT 32.9* 32.4* 34.7* 34.0*  PLT 218 187 PLATELET CLUMPS NOTED ON SMEAR, UNABLE TO ESTIMATE 158    COAGS: Recent Labs    11/01/19 1151  INR 1.0    BMP: Recent Labs    12/14/19 0731 12/14/19 0731 12/28/19 0922 12/28/19 0922 01/04/20 0907 01/04/20 0907 01/08/20 1342 01/08/20 1342 01/18/20 0917 01/25/20 1116 02/01/20 0908 02/08/20 0909  NA 141   < > 136   < > 142   < > 140   < > 141 141 139 142  K 3.1*   < > 3.3*   < > 3.5   < > 3.6   < > 2.9* 4.2 4.2 3.6  CL 103   < > 96*   < > 105   < > 100   < > 105 105 101 105  CO2 27   < > 34*   < > 29   < > 27   < > 28 29 31 28   GLUCOSE 87   < > 114*   < > 85   < > 91   < > 100* 78 80 95  BUN 6*   < > 10   < > <4*   < > 8   < > 9 11 14 15   CALCIUM 9.4   < > 9.1   < > 9.3   < > 9.2   < > 9.5 9.3 9.1 8.7*  CREATININE 0.66   < > 0.75   < > 0.75   < > 0.66  --  0.68 0.72 0.73 0.82  GFRNONAA >60   < > >60   < > >60   < > >60  --  >60 >60 >60 >60  GFRAA >60  --  >60  --  >60  --  >60  --   --   --   --   --    < > = values in this interval not displayed.    LIVER FUNCTION TESTS: Recent Labs    01/18/20 0917 01/25/20 1116 02/01/20 0908 02/08/20 0909  BILITOT <0.2* <0.2* 0.5 0.3  AST 19 18 27  73*  ALT 15 26 46* 122*  ALKPHOS 40 41 39 52  PROT 7.1 6.6 6.3* 6.7  ALBUMIN 3.4* 3.4* 3.4* 3.5     Assessment and Plan:  59 y.o. female outpatient. Former smoker history of GERD, metastatic non small cell lung cancer s/p radiotherapy with encasement of the right lower lobe pulmonary artery and bulky adenopathy to the chest and neck with dysphagia.    All labs and medications are  within acceptable parameters. Allergies include PCN. Patient has been NPO since midnight.    Risks and benefits of image guided port-a-catheter placement was discussed with the patient including, but not limited to bleeding, infection, pneumothorax, or fibrin sheath development and need for additional procedures.  All of the patient's questions were answered, patient is agreeable to proceed. Consent signed and in chart.   Thank you for this interesting consult.  I greatly enjoyed meeting Saori A Feagan and look forward to participating in their care.  A copy of this report was sent to the requesting provider on this date.  Electronically  Signed: Jacqualine Mau, NP 02/16/2020, 2:13 PM   I spent a total of  30 Minutes   in face to face in clinical consultation, greater than 50% of which was counseling/coordinating care for portacath placement

## 2020-02-16 NOTE — Procedures (Signed)
Interventional Radiology Procedure Note  Procedure: Placement of a right IJ approach single lumen PowerPort.  Tip is positioned at the superior cavoatrial junction and catheter is ready for immediate use.  Complications: No immediate Recommendations:  - Ok to shower tomorrow - Do not submerge for 7 days - Routine line care   Signed,  Solange Emry K. Isack Lavalley, MD   

## 2020-02-16 NOTE — Discharge Instructions (Signed)
Moderate Conscious Sedation, Adult Sedation is the use of medicines to promote relaxation and relieve discomfort and anxiety. Moderate conscious sedation is a type of sedation. Under moderate conscious sedation, you are less alert than normal, but you are still able to respond to instructions, touch, or both. Moderate conscious sedation is used during short medical and dental procedures. It is milder than deep sedation, which is a type of sedation under which you cannot be easily woken up. It is also milder than general anesthesia, which is the use of medicines to make you unconscious. Moderate conscious sedation allows you to return to your regular activities sooner. Tell a health care provider about:  Any allergies you have.  All medicines you are taking, including vitamins, herbs, eye drops, creams, and over-the-counter medicines.  Use of steroids (by mouth or creams).  Any problems you or family members have had with sedatives and anesthetic medicines.  Any blood disorders you have.  Any surgeries you have had.  Any medical conditions you have, such as sleep apnea.  Whether you are pregnant or may be pregnant.  Any use of cigarettes, alcohol, marijuana, or street drugs. What are the risks? Generally, this is a safe procedure. However, problems may occur, including:  Getting too much medicine (oversedation).  Nausea.  Allergic reaction to medicines.  Trouble breathing. If this happens, a breathing tube may be used to help with breathing. It will be removed when you are awake and breathing on your own.  Heart trouble.  Lung trouble. What happens before the procedure? Staying hydrated Follow instructions from your health care provider about hydration, which may include:  Up to 2 hours before the procedure - you may continue to drink clear liquids, such as water, clear fruit juice, black coffee, and plain tea. Eating and drinking restrictions Follow instructions from your  health care provider about eating and drinking, which may include:  8 hours before the procedure - stop eating heavy meals or foods such as meat, fried foods, or fatty foods.  6 hours before the procedure - stop eating light meals or foods, such as toast or cereal.  6 hours before the procedure - stop drinking milk or drinks that contain milk.  2 hours before the procedure - stop drinking clear liquids. Medicine Ask your health care provider about:  Changing or stopping your regular medicines. This is especially important if you are taking diabetes medicines or blood thinners.  Taking medicines such as aspirin and ibuprofen. These medicines can thin your blood. Do not take these medicines before your procedure if your health care provider instructs you not to.  Tests and exams  You will have a physical exam.  You may have blood tests done to show: ? How well your kidneys and liver are working. ? How well your blood can clot. General instructions  Plan to have someone take you home from the hospital or clinic.  If you will be going home right after the procedure, plan to have someone with you for 24 hours. What happens during the procedure?  An IV tube will be inserted into one of your veins.  Medicine to help you relax (sedative) will be given through the IV tube.  The medical or dental procedure will be performed. What happens after the procedure?  Your blood pressure, heart rate, breathing rate, and blood oxygen level will be monitored often until the medicines you were given have worn off.  Do not drive for 24 hours. This information is not  intended to replace advice given to you by your health care provider. Make sure you discuss any questions you have with your health care provider. Document Revised: 03/05/2017 Document Reviewed: 07/13/2015 Elsevier Patient Education  [place] Insertion, Care After This sheet gives you information about how  to care for yourself after your procedure. Your health care provider may also give you more specific instructions. If you have problems or questions, contact your health care provider. What can I expect after the procedure? After the procedure, it is common to have:  Discomfort at the port insertion site.  Bruising on the skin over the port. This should improve over 3-4 days. Follow these instructions at home: [facility] care  After your port is placed, you will get a manufacturer's information card. The card has information about your port. Keep this card with you at all times.  Take care of the port as told by your health care provider. Ask your health care provider if you or a family member can get training for taking care of the port at home. A home health care nurse may also take care of the port.  Make sure to remember what type of port you have. Incision care      Follow instructions from your health care provider about how to take care of your port insertion site. Make sure you: ? Wash your hands with soap and water before and after you change your bandage (dressing). If soap and water are not available, use hand sanitizer. ? Change your dressing as told by your health care provider. ? Leave stitches (sutures), skin glue, or adhesive strips in place. These skin closures may need to stay in place for 2 weeks or longer. If adhesive strip edges start to loosen and curl up, you may trim the loose edges. Do not remove adhesive strips completely unless your health care provider tells you to do that.  Check your port insertion site every day for signs of infection. Check for: ? Redness, swelling, or pain. ? Fluid or blood. ? Warmth. ? Pus or a bad smell. Activity  Return to your normal activities as told by your health care provider. Ask your health care provider what activities are safe for you.  Do not lift anything that is heavier than 10 lb (4.5 kg), or the limit that you are told,  until your health care provider says that it is safe. General instructions  Take over-the-counter and prescription medicines only as told by your health care provider.  Do not take baths, swim, or use a hot tub until your health care provider approves. Ask your health care provider if you may take showers. You may only be allowed to take sponge baths.  Do not drive for 24 hours if you were given a sedative during your procedure.  Wear a medical alert bracelet in case of an emergency. This will tell any health care providers that you have a port.  Keep all follow-up visits as told by your health care provider. This is important. Contact a health care provider if:  You cannot flush your port with saline as directed, or you cannot draw blood from the port.  You have a fever or chills.  You have redness, swelling, or pain around your port insertion site.  You have fluid or blood coming from your port insertion site.  Your port insertion site feels warm to the touch.  You have pus or a bad smell coming from the port  insertion site. Get help right away if:  You have chest pain or shortness of breath.  You have bleeding from your port that you cannot control. Summary  Take care of the port as told by your health care provider. Keep the manufacturer's information card with you at all times.  Change your dressing as told by your health care provider.  Contact a health care provider if you have a fever or chills or if you have redness, swelling, or pain around your port insertion site.  Keep all follow-up visits as told by your health care provider. This information is not intended to replace advice given to you by your health care provider. Make sure you discuss any questions you have with your health care provider. Document Revised: 10/19/2017 Document Reviewed: 10/19/2017 Elsevier Patient Education  2020 [place] may remove the dressing tomorrow after 4:00 pm,  shower, you do not have to put anything back over the site, it will look bruised and swollen, you may use an ice pack over the site, to help with swelling. You can take what you normally take for aches and pain, it will be sore.  You can not use the elma cream or lidocaine cream until the skin glue has come off, this usually takes 2 weeks. I recommend a button- up shirt to wear to the cancer center. Just tell the nurse at the cancer center to apply an ice pack over the site for a few minutes before she access it. ( this will help with the pain).

## 2020-02-17 ENCOUNTER — Other Ambulatory Visit (HOSPITAL_COMMUNITY)
Admission: RE | Admit: 2020-02-17 | Discharge: 2020-02-17 | Disposition: A | Discharge status: Home / Self Care | Payer: Managed Care, Other (non HMO) | Source: Ambulatory Visit | Attending: Internal Medicine | Admitting: Internal Medicine

## 2020-02-17 ENCOUNTER — Encounter (HOSPITAL_COMMUNITY): Payer: Self-pay | Admitting: *Deleted

## 2020-02-17 DIAGNOSIS — C3490 Malignant neoplasm of unspecified part of unspecified bronchus or lung: Secondary | ICD-10-CM | POA: Diagnosis not present

## 2020-02-17 LAB — SARS CORONAVIRUS 2 (TAT 6-24 HRS): SARS Coronavirus 2: NEGATIVE

## 2020-02-17 NOTE — Progress Notes (Signed)
[facility] OFF[facility] PROGRESS NOTE  Minette Brine, [address] [place] [address]  DIAGNOSIS: Stage IV non-small cell lung cancer, most consistent with adenocarcinoma.The patient presented with a right perihilar lung mass which encases and occludes the right lower lobe pulmonary artery. There is also associated case meant of the right upper lobe, right middle lobe, and right lower lobe bronchi. The polyp patient also has associated bilateral mediastinal, right axillary, cervical, and supraclavicular lymphadenopathy.She also has metastatic disease to the brain.The patient was diagnosed in July 2021  Biomarker Findings Tumor Mutational Burden - 16 Muts/Mb Microsatellite status - MS-Stable Genomic Findings For a complete list of the genes assayed, please refer to the Appendix. KRAS G12V PTEN loss exon 2 KDM6A deletion exons 6-21 TERT promoter -124C>T TP53 V274F 7 Disease relevant genes with no reportable alterations: ALK, BRAF, EGFR, ERBB2, MET, RET, ROS1  Tumor Proportion Score (TPS) (95%)  PRIOR THERAPY: 1) Palliative radiotherapy to the lung mass which is causing encasement of the right upper lobe, right middle lobe, and right lower lobe as well as palliative radiotherapy to the painful cervical adenopathy under the care of Dr. Sondra Come. Last dose of radiation on 12/04/2019 2) SRS to the brain metastases under the care of Dr. Isidore Moos. Treatment expected on 01/03/20 and 01/10/20.  CURRENT THERAPY: Palliative systemic chemotherapy with carboplatin for an AUC of 5, Alimta 500 mg per metered squared, Keytruda 200 mg IV every 3 weeks. First dose on 12/12/19.Status post 3 cycles.  INTERVAL HISTORY: Shambhavi A Fruth 59 y.o. female returns to the clinic today for a follow-up visit.  The patient is feeling fairly well today without any concerning complaints. The patient was supposed to have a restaging CT scan of the neck due to her bulky cervical  adenopathy, chest, and abdomen and pelvis.  Unfortunately, there was issues with insurance authorization and her restaging CT scan is not scheduled until 03/05/2020.  The patient completed SRS to the numerous brain metastases under the care of Dr. Isidore Moos in October 2021.  Due to the extensive disease, Dr. Isidore Moos is planning on performing a restaging brain MRI tomorrow on 02/20/2020.  Additionally, in the interval the patient recently had a Port-A-Cath placed.  She tolerated this procedure well.  Otherwise, the patient denies any recent fever, chills, night sweats, or weight loss. She actually gained weight and has been eating better. She denies any swelling. She still have dyspnea on exertion but states it is improved from prior. She does need to take a few minutes to catch her breath.  She is on oxygen intermittently which helps her shortness of breath. Her chest tightness with deep breathing has improved from prior as well as her cough. She is scheduled to see Dr. Lamonte Sakai from pulmonology next week. She denies any nausea, vomiting, diarrhea, or constipation. She has been eating beans and drinking water which has helped her constipation. She still is on decadron for her treated brain metastases. She follow up with Dr. Isidore Moos later this week. She denies any headaches but has intermittent bright spots in her vision.  She denies any rashes or skin changes.  The patient is here today for evaluation before starting cycle #4.    MEDICAL HISTORY: Past Medical History:  Diagnosis Date  . Anemia   . Angina 1982   related to stress  . Asthma    in the past, not now- never inhlaer use  . Complication of anesthesia    states anesthesia made her hair  fall out  . Constipation   . Fatigue   . GERD (gastroesophageal reflux disease)   . Heart murmur 1970s  . Ingrown toenail   . Lung cancer ([facility]) 10/2019   metastatic disease to the brain  . On home oxygen therapy    2.2 lpm, 24 hours a day  . Past heart  attack 1980-1981   pt states she passed out and woke up in hospital- told she had heart attack, but then dr said he couldn't find anything wrong.  . Pneumonia     ALLERGIES:  is allergic to pseudoephedrine, gabapentin, mobic [meloxicam], and penicillins.  MEDICATIONS:  Current Outpatient Medications  Medication Sig Dispense Refill  . acetaminophen (TYLENOL) 500 MG tablet Take 1,000 mg by mouth every 6 (six) hours as needed for moderate pain.     Marland Kitchen albuterol (VENTOLIN HFA) 108 (90 Base) MCG/ACT inhaler Inhale 2 puffs into the lungs every 6 (six) hours as needed for wheezing or shortness of breath. 18 g 6  . Artificial Saliva (BIOTENE DRY MOUTH MOISTURIZING) SOLN Use as directed 1 Dose in the mouth or throat as needed (dry mouth).    . budesonide-formoterol (SYMBICORT) 80-4.5 MCG/ACT inhaler Inhale 2 puffs into the lungs in the morning and at bedtime. 1 each 11  . Carboxymethylcellul-Glycerin (LUBRICATING EYE DROPS OP) Place 1 drop into both eyes daily as needed (irritation).    . Cream Base (WOUND CARE) CREA Apply 1 application topically as needed (wound care).    Marland Kitchen dexamethasone (DECADRON) 4 MG tablet Take 1 tablet TID through 10-16, then 1 tablet BID through 10-30, then 1 tablet daily until you see Dr. Isidore Moos to review MRI results. (Patient taking differently: Take 4 mg by mouth daily. ) 80 tablet 0  . DIGESTIVE ENZYMES PO Take 0.5-1 tablets by mouth 3 (three) times daily before meals.    . docusate sodium (COLACE) 100 MG capsule Take 100 mg by mouth daily as needed for mild constipation.     . fexofenadine (ALLEGRA) 180 MG tablet Take 180 mg by mouth at bedtime.    . fluticasone (FLONASE) 50 MCG/ACT nasal spray Place 2 sprays into both nostrils daily.    . fluticasone (FLOVENT HFA) 44 MCG/ACT inhaler Inhale 2 puffs into the lungs 2 (two) times daily. 1 each 2  . folic acid (FOLVITE) 1 MG tablet Take 1 tablet (1 mg total) by mouth daily. 30 tablet 4  . guaiFENesin (MUCINEX) 600 MG 12 hr  tablet Take 600 mg by mouth 2 (two) times daily.     Marland Kitchen HYDROcodone-acetaminophen (HYCET) 7.5-325 mg/15 ml solution Take 10 mLs by mouth 2 (two) times daily. For pain from cancer and radiation reactions 473 mL 0  . HYDROcodone-acetaminophen (NORCO/VICODIN) 5-325 MG tablet Take 1-2 tablets by mouth every 6 (six) hours as needed for moderate pain. (Patient not taking: Reported on 02/14/2020) 60 tablet 0  . lidocaine (XYLOCAINE) 2 % solution Use as directed 15 mLs in the mouth or throat as needed for mouth pain. Swallow 30 min prior to meals and at bedtime for throat/esophagus pain 250 mL 1  . lidocaine-prilocaine (EMLA) cream Apply 1 application topically as needed. (Patient taking differently: Apply 1 application topically as needed (port access). ) 30 g 2  . LORazepam (ATIVAN) 0.5 MG tablet Please take 1 tablet 30 minutes before your scan as needed for anxiety 2 tablet 0  . LORazepam (ATIVAN) 2 MG tablet Take 1 tab 30 min before radiation treatment to the brain. (Patient not taking:  Reported on 02/14/2020) 1 tablet 0  . Multiple Vitamin (MULTIVITAMIN WITH MINERALS) TABS tablet Take 1 tablet by mouth daily.    Marland Kitchen neomycin-bacitracin-polymyxin (NEOSPORIN) ointment Apply 1 application topically daily as needed for wound care.    . neomycin-polymyxin-hydrocortisone (CORTISPORIN) OTIC solution Apply 1-2 drops to toe after soaking BID (Patient taking differently: 1-2 drops See admin instructions. Apply 1-2 drops to toe after soaking twice daily) 10 mL 1  . omeprazole (PRILOSEC) 20 MG capsule Take 1 capsule (20 mg total) by mouth daily. Take as long as you are on dexamethasone to protect from stomach irritation. 30 capsule 2  . ondansetron (ZOFRAN ODT) 8 MG disintegrating tablet Take 1 tablet (8 mg total) by mouth every 8 (eight) hours as needed for nausea or vomiting. 30 tablet 3  . ondansetron (ZOFRAN) 4 MG tablet Take 1 tablet (4 mg total) by mouth every 8 (eight) hours as needed for nausea or vomiting. 20  tablet 0  . orphenadrine (NORFLEX) 100 MG tablet Take 1 tablet (100 mg total) by mouth 2 (two) times daily as needed for muscle spasms. 60 tablet 1  . potassium chloride 20 MEQ/15ML (10%) SOLN Take 15 mLs (20 mEq total) by mouth 2 (two) times daily. (Patient taking differently: Take 20 mEq by mouth daily. ) 210 mL 0  . potassium chloride SA (KLOR-CON) 20 MEQ tablet Take 1 tablet (20 mEq total) by mouth 2 (two) times daily. (Patient not taking: Reported on 02/14/2020) 14 tablet 0  . prochlorperazine (COMPAZINE) 10 MG tablet Take 1 tablet (10 mg total) by mouth every 6 (six) hours as needed. (Patient taking differently: Take 10 mg by mouth every 6 (six) hours as needed for nausea or vomiting. ) 30 tablet 2  . sucralfate (CARAFATE) 1 g tablet Take 1 tablet (1 g total) by mouth 4 (four) times daily -  with meals and at bedtime. (Patient not taking: Reported on 02/14/2020) 90 tablet 2   No current facility-administered medications for this visit.    SURGICAL HISTORY:  Past Surgical History:  Procedure Laterality Date  . ingrown toe nail surgery    . IR IMAGING GUIDED PORT INSERTION  02/16/2020  . NECK SURGERY  2000   Disc removed from neck   . RADIOLOGY WITH ANESTHESIA N/A 12/05/2019   Procedure: MRI BRAIN WITH AND WITHOUT CONTRAST;  Surgeon: Radiologist, Medication, MD;  Location: [facility];  Service: Radiology;  Laterality: N/A;  . RADIOLOGY WITH ANESTHESIA N/A 01/02/2020   Procedure: MRI BRAIN WITH AND WITHOUT CONTRAST;  Surgeon: Radiologist, Medication, MD;  Location: [facility];  Service: Radiology;  Laterality: N/A;    REVIEW OF SYSTEMS:   Review of Systems  Constitutional: Positive for fatigue. Negative for appetite change, chills, fever and unexpected weight change.  HENT: Negative for mouth sores, nosebleeds, sore throat and trouble swallowing.   Eyes: Positive for occasional light spots in visual field. Negative for icterus.  Respiratory: Positive for dyspnea on exertion. Negative for cough,  hemoptysis,  and wheezing.   Cardiovascular: Negative for chest pain and leg swelling.  Gastrointestinal: Negative for abdominal pain, constipation, diarrhea, nausea and vomiting.  Genitourinary: Negative for bladder incontinence, difficulty urinating, dysuria, frequency and hematuria.   Musculoskeletal: Negative for back pain, gait problem, neck pain and neck stiffness.  Skin: Negative for itching and rash.  Neurological: Negative for dizziness, extremity weakness, gait problem, headaches, light-headedness and seizures.  Hematological: Positive for improving adenopathy in the left cervical region. Does not bruise/bleed easily.  Psychiatric/Behavioral: Negative for  confusion, depression and sleep disturbance. The patient is not nervous/anxious.     PHYSICAL EXAMINATION:  Blood pressure 101/79, pulse 97, temperature 98.9 F (37.2 C), temperature source Tympanic, resp. rate 16, height 5' 5.5" (1.664 m), weight 147 lb 11.2 oz (67 kg), SpO2 97 %.  ECOG PERFORMANCE STATUS: 1 - Symptomatic but completely ambulatory  Physical Exam  Constitutional: Oriented to person, place, and time and well-developed, well-nourished, and in no distress.  HENT:  Head: Normocephalic and atraumatic.  Mouth/Throat: Oropharynx is clear and moist.No significant thrush visualized on exam today.  Eyes:Ptosis noted on left eyelid.Conjunctivae are normal. Right eye exhibits no discharge. Left eye exhibits no discharge. No scleral icterus.  Neck:Bilateral cervical/supraclavicular adenopathy (L>R) improving. Range of motion limited by large neck mass.Neck supple.  Cardiovascular: Normal rate, regular rhythm, normal heart sounds and intact distal pulses.  Pulmonary/Chest: Effort normal. Decreased breath sounds in right lung.No respiratory distress. No wheezes. No rales.  Abdominal: Soft. Bowel sounds are normal. Exhibits no distension and no mass. There is no tenderness.  Musculoskeletal: Normal range of motion.  Exhibits no edema.  Lymphadenopathy:  Right axillary, bilateral cervical and supraclavicular adenopathy (improving)  Neurological: Alert and oriented to person, place, and time. Exhibits normal muscle tone. Gait normal. Coordination normal.  Skin: Skin is warm and dry. No rash noted. Not diaphoretic. No erythema. No pallor.  Psychiatric: Mood, memory and judgment normal.  Vitals reviewed.  LABORATORY DATA: Lab Results  Component Value Date   WBC 7.3 02/19/2020   HGB 10.0 (L) 02/19/2020   HCT 30.6 (L) 02/19/2020   MCV 94.4 02/19/2020   PLT 189 02/19/2020      Chemistry      Component Value Date/Time   NA 142 02/19/2020 0822   NA 139 07/31/2019 1518   K 3.8 02/19/2020 0822   CL 106 02/19/2020 0822   CO2 25 02/19/2020 0822   BUN 13 02/19/2020 0822   BUN 8 07/31/2019 1518   CREATININE 0.84 02/19/2020 0822      Component Value Date/Time   CALCIUM 8.7 (L) 02/19/2020 0822   ALKPHOS 57 02/19/2020 0822   AST 32 02/19/2020 0822   ALT 72 (H) 02/19/2020 0822   BILITOT 0.3 02/19/2020 0822       RADIOGRAPHIC STUDIES:  IR IMAGING GUIDED PORT INSERTION  Result Date: 02/16/2020 INDICATION: 59 year old female with metastatic non-small cell lung cancer. She presents for port catheter placement. EXAM: IMPLANTED PORT A CATH PLACEMENT WITH ULTRASOUND AND FLUOROSCOPIC GUIDANCE MEDICATIONS: 900 mg clindamycin; The antibiotic was administered within an appropriate time interval prior to skin puncture. ANESTHESIA/SEDATION: Versed 2 mg IV; Fentanyl 50 mcg IV; Moderate Sedation Time:  20 minutes The patient was continuously monitored during the procedure by the interventional radiology nurse under my direct supervision. FLUOROSCOPY TIME:  0 minutes, 18 seconds (3 mGy) COMPLICATIONS: None immediate. PROCEDURE: The right neck and chest was prepped with chlorhexidine, and draped in the usual sterile fashion using maximum barrier technique (cap and mask, sterile gown, sterile gloves, large sterile  sheet, hand hygiene and cutaneous antiseptic). Local anesthesia was attained by infiltration with 1% lidocaine with epinephrine. Ultrasound demonstrated patency of the right internal jugular vein, and this was documented with an image. Under real-time ultrasound guidance, this vein was accessed with a 21 gauge micropuncture needle and image documentation was performed. A small dermatotomy was made at the access site with an 11 scalpel. A 0.018" wire was advanced into the SVC and the access needle exchanged for a 45F micropuncture vascular  sheath. The 0.018" wire was then removed and a 0.035" wire advanced into the IVC. An appropriate location for the subcutaneous reservoir was selected below the clavicle and an incision was made through the skin and underlying soft tissues. The subcutaneous tissues were then dissected using a combination of blunt and sharp surgical technique and a pocket was formed. A single lumen power injectable portacatheter was then tunneled through the subcutaneous tissues from the pocket to the dermatotomy and the port reservoir placed within the subcutaneous pocket. The venous access site was then serially dilated and a peel away vascular sheath placed over the wire. The wire was removed and the port catheter advanced into position under fluoroscopic guidance. The catheter tip is positioned in the superior cavoatrial junction. This was documented with a spot image. The portacatheter was then tested and found to flush and aspirate well. The port was flushed with saline followed by 100 units/mL heparinized saline. The pocket was then closed in two layers using first subdermal inverted interrupted absorbable sutures followed by a running subcuticular suture. The epidermis was then sealed with Dermabond. The dermatotomy at the venous access site was also closed with Dermabond. IMPRESSION: Successful placement of a right IJ approach Power Port with ultrasound and fluoroscopic guidance. The catheter  is ready for use. Electronically Signed   By: Jacqulynn Cadet M.D.   On: 02/16/2020 16:47     ASSESSMENT/PLAN:  This is a very pleasant 18 year oldAfrican Americanfemale diagnosed withmetastatic non-small cell lung cancer, most consistent with adenocarcinoma.The patient presented with a right perihilar lung mass which encases and occludes the right lower lobe pulmonary artery. There is also associatedencasementof the right upper lobe, right middle lobe, and right lower lobe bronchi. The patient also has associated bilateral mediastinal, right axillary, cervical, and supraclavicular lymphadenopathy. The patient was diagnosed in July 2021. Her PD-L1 expression is 95%. Her foundation 1 molecular studies show no actionable mutations  The patientcompletedpalliative radiotherapy to the bulky cervical lymphadenopathy and the obstructive mass which is encasing the right upper lobe, right middle lobe, and right lower lobe. She receiving radiation under the care of Dr. Sondra Come. She receivedher last dose of radiotherapy on 12/04/2019.  She completed SRS to the numerous brain metastases under the care of Dr. Isidore Moos in October 2021.   She is scheduled for repeat brain MRI tomorrow on 02/20/2020.  The patient is currently undergoing palliative systemic chemotherapy with carboplatin for an AUC of 5, Alimta 500 mg per metered squared, Keytruda 200 mg IV every 3 weeks.  She is status post 3 cycles.  The patient was supposed to have a restaging CT scan of the chest, neck, abdomen, and pelvis prior to starting today's cycle of treatment.  Unfortunately, there was challenges obtaining insurance authorization in the scan is not scheduled until 03/05/2020.  Labs were reviewed.  Recommend that she proceed with cycle #4 today scheduled.  We will see the patient back for follow-up visit in 3 weeks for evaluation and to review her restaging CT scan results.  Reviewed how to use the EMLA cream with the patient.    The patient will follow up with Dr. Isidore Moos later this week as scheduled. She will follow up with pulmonology next week as scheduled.   She is scheduled to meet with nutrition on the phone tomorrow. Her appetite is improving and she is meal prepping and gaining weight.   The patient was advised to call immediately if she has any concerning symptoms in the interval. The patient voices understanding of  current disease status and treatment options and is in agreement with the current care plan. All questions were answered. The patient knows to call the clinic with any problems, questions or concerns. We can certainly see the patient much sooner if necessary     No orders of the defined types were placed in this encounter.    Nikhita Mentzel L Betzaira Mentel, PA-C 02/19/20

## 2020-02-17 NOTE — Progress Notes (Addendum)
Patient reports being short of breath and starting on 24- hour oxygen therapy at 2.2 lpm approximately 30 days ago. Patient reports ER visit for chest pain in October. During phone call patient speaking in short sentences and appeared to have labored breathing. Patient denies cardiology visit. Patient states she is staying in bed a lot. .  Patient states she does not have portable oxygen yet. I advised patient that if she cannot safely make it without oxygen to call her ordering physician to reschedule MRI. She states she would be ok to come to hospital and go home without oxygen. Educated on Environmental manager and states in quarantine since COVID test.   I sent inbox message to Rexene Edison, NP regarding patient not having portable oxygen.

## 2020-02-19 ENCOUNTER — Inpatient Hospital Stay: Payer: Managed Care, Other (non HMO)

## 2020-02-19 ENCOUNTER — Ambulatory Visit (HOSPITAL_COMMUNITY): Payer: Managed Care, Other (non HMO)

## 2020-02-19 ENCOUNTER — Inpatient Hospital Stay (HOSPITAL_BASED_OUTPATIENT_CLINIC_OR_DEPARTMENT_OTHER): Payer: Managed Care, Other (non HMO) | Admitting: Physician Assistant

## 2020-02-19 ENCOUNTER — Other Ambulatory Visit (HOSPITAL_COMMUNITY): Payer: Managed Care, Other (non HMO)

## 2020-02-19 ENCOUNTER — Other Ambulatory Visit: Payer: Self-pay

## 2020-02-19 VITALS — BP 101/79 | HR 97 | Temp 98.9°F | Resp 16 | Ht 65.5 in | Wt 147.7 lb

## 2020-02-19 DIAGNOSIS — Z5112 Encounter for antineoplastic immunotherapy: Secondary | ICD-10-CM

## 2020-02-19 DIAGNOSIS — C3491 Malignant neoplasm of unspecified part of right bronchus or lung: Secondary | ICD-10-CM

## 2020-02-19 DIAGNOSIS — C3401 Malignant neoplasm of right main bronchus: Secondary | ICD-10-CM | POA: Diagnosis not present

## 2020-02-19 DIAGNOSIS — Z5111 Encounter for antineoplastic chemotherapy: Secondary | ICD-10-CM | POA: Diagnosis not present

## 2020-02-19 DIAGNOSIS — Z95828 Presence of other vascular implants and grafts: Secondary | ICD-10-CM

## 2020-02-19 LAB — CBC WITH DIFFERENTIAL (CANCER CENTER ONLY)
Abs Immature Granulocytes: 0.2 10*3/uL — ABNORMAL HIGH (ref 0.00–0.07)
Basophils Absolute: 0.1 10*3/uL (ref 0.0–0.1)
Basophils Relative: 1 %
Eosinophils Absolute: 0 10*3/uL (ref 0.0–0.5)
Eosinophils Relative: 1 %
HCT: 30.6 % — ABNORMAL LOW (ref 36.0–46.0)
Hemoglobin: 10 g/dL — ABNORMAL LOW (ref 12.0–15.0)
Immature Granulocytes: 3 %
Lymphocytes Relative: 12 %
Lymphs Abs: 0.9 10*3/uL (ref 0.7–4.0)
MCH: 30.9 pg (ref 26.0–34.0)
MCHC: 32.7 g/dL (ref 30.0–36.0)
MCV: 94.4 fL (ref 80.0–100.0)
Monocytes Absolute: 0.8 10*3/uL (ref 0.1–1.0)
Monocytes Relative: 11 %
Neutro Abs: 5.4 10*3/uL (ref 1.7–7.7)
Neutrophils Relative %: 72 %
Platelet Count: 189 10*3/uL (ref 150–400)
RBC: 3.24 MIL/uL — ABNORMAL LOW (ref 3.87–5.11)
RDW: 21 % — ABNORMAL HIGH (ref 11.5–15.5)
WBC Count: 7.3 10*3/uL (ref 4.0–10.5)
nRBC: 0.3 % — ABNORMAL HIGH (ref 0.0–0.2)

## 2020-02-19 LAB — CMP (CANCER CENTER ONLY)
ALT: 72 U/L — ABNORMAL HIGH (ref 0–44)
AST: 32 U/L (ref 15–41)
Albumin: 3.2 g/dL — ABNORMAL LOW (ref 3.5–5.0)
Alkaline Phosphatase: 57 U/L (ref 38–126)
Anion gap: 11 (ref 5–15)
BUN: 13 mg/dL (ref 8–23)
CO2: 25 mmol/L (ref 22–32)
Calcium: 8.7 mg/dL — ABNORMAL LOW (ref 8.9–10.3)
Chloride: 106 mmol/L (ref 98–111)
Creatinine: 0.84 mg/dL (ref 0.44–1.00)
GFR, Estimated: >60 mL/min (ref >60)
Glucose, Bld: 102 mg/dL — ABNORMAL HIGH (ref 70–99)
Potassium: 3.8 mmol/L (ref 3.5–5.1)
Sodium: 142 mmol/L (ref 135–145)
Total Bilirubin: 0.3 mg/dL (ref 0.3–1.2)
Total Protein: 6.3 g/dL — ABNORMAL LOW (ref 6.5–8.1)

## 2020-02-19 MED ORDER — PALONOSETRON HCL INJECTION 0.25 MG/5ML
0.2500 mg | Freq: Once | INTRAVENOUS | Status: AC
  Administered 2020-02-19: 0.25 mg via INTRAVENOUS

## 2020-02-19 MED ORDER — SODIUM CHLORIDE 0.9 % IV SOLN
500.0000 mg/m2 | Freq: Once | INTRAVENOUS | Status: AC
  Administered 2020-02-19: 800 mg via INTRAVENOUS
  Filled 2020-02-19: qty 20

## 2020-02-19 MED ORDER — SODIUM CHLORIDE 0.9 % IV SOLN
10.0000 mg | Freq: Once | INTRAVENOUS | Status: AC
  Administered 2020-02-19: 10 mg via INTRAVENOUS
  Filled 2020-02-19: qty 10

## 2020-02-19 MED ORDER — HEPARIN SOD (PORK) LOCK FLUSH 100 UNIT/ML IV SOLN
500.0000 [IU] | Freq: Once | INTRAVENOUS | Status: AC | PRN
  Administered 2020-02-19: 500 [IU]
  Filled 2020-02-19: qty 5

## 2020-02-19 MED ORDER — SODIUM CHLORIDE 0.9% FLUSH
10.0000 mL | Freq: Once | INTRAVENOUS | Status: AC
  Administered 2020-02-19: 10 mL via INTRAVENOUS
  Filled 2020-02-19: qty 10

## 2020-02-19 MED ORDER — SODIUM CHLORIDE 0.9 % IV SOLN
Freq: Once | INTRAVENOUS | Status: AC
  Filled 2020-02-19: qty 250

## 2020-02-19 MED ORDER — SODIUM CHLORIDE 0.9 % IV SOLN
150.0000 mg | Freq: Once | INTRAVENOUS | Status: AC
  Administered 2020-02-19: 150 mg via INTRAVENOUS
  Filled 2020-02-19: qty 150

## 2020-02-19 MED ORDER — SODIUM CHLORIDE 0.9% FLUSH
10.0000 mL | INTRAVENOUS | Status: DC | PRN
  Administered 2020-02-19: 10 mL
  Filled 2020-02-19: qty 10

## 2020-02-19 MED ORDER — SODIUM CHLORIDE 0.9 % IV SOLN
200.0000 mg | Freq: Once | INTRAVENOUS | Status: AC
  Administered 2020-02-19: 200 mg via INTRAVENOUS
  Filled 2020-02-19: qty 8

## 2020-02-19 MED ORDER — SODIUM CHLORIDE 0.9 % IV SOLN
467.5000 mg | Freq: Once | INTRAVENOUS | Status: AC
  Administered 2020-02-19: 470 mg via INTRAVENOUS
  Filled 2020-02-19: qty 47

## 2020-02-19 MED ORDER — PALONOSETRON HCL INJECTION 0.25 MG/5ML
INTRAVENOUS | Status: AC
  Filled 2020-02-19: qty 5

## 2020-02-19 NOTE — Anesthesia Preprocedure Evaluation (Addendum)
Anesthesia Evaluation  Patient identified by MRN, date of birth, ID band Patient awake    Reviewed: Allergy & Precautions, NPO status , Patient's Chart, lab work & pertinent test results  Airway Mallampati: II  TM Distance: >3 FB     Dental   Pulmonary pneumonia, former smoker,    breath sounds clear to auscultation       Cardiovascular + angina + Valvular Problems/Murmurs  Rhythm:Regular Rate:Normal     Neuro/Psych    GI/Hepatic Neg liver ROS, GERD  ,  Endo/Other  negative endocrine ROS  Renal/GU negative Renal ROS     Musculoskeletal   Abdominal   Peds  Hematology  (+) anemia ,   Anesthesia Other Findings   Reproductive/Obstetrics                             Anesthesia Physical Anesthesia Plan  ASA: III  Anesthesia Plan: General   Post-op Pain Management:    Induction: Intravenous  PONV Risk Score and Plan: 3 and Ondansetron, Dexamethasone and Midazolam  Airway Management Planned: LMA  Additional Equipment:   Intra-op Plan:   Post-operative Plan: Extubation in OR  Informed Consent: I have reviewed the patients History and Physical, chart, labs and discussed the procedure including the risks, benefits and alternatives for the proposed anesthesia with the patient or authorized representative who has indicated his/her understanding and acceptance.     Dental advisory given  Plan Discussed with: CRNA and Anesthesiologist  Anesthesia Plan Comments: (PAT note written 02/19/2020 by Myra Gianotti, PA-C. )       Anesthesia Quick Evaluation

## 2020-02-19 NOTE — Patient Instructions (Signed)
[facility] Discharge Instructions for Patients Receiving Chemotherapy  Today you received the following chemotherapy agents: pembrolizumab, pemetrexed, and carboplatin.  To help prevent nausea and vomiting after your treatment, we encourage you to take your nausea medication as directed.   If you develop nausea and vomiting that is not controlled by your nausea medication, call the clinic.   BELOW ARE SYMPTOMS THAT SHOULD BE REPORTED IMMEDIATELY:  *FEVER GREATER THAN 100.5 F  *CHILLS WITH OR WITHOUT FEVER  NAUSEA AND VOMITING THAT IS NOT CONTROLLED WITH YOUR NAUSEA MEDICATION  *UNUSUAL SHORTNESS OF BREATH  *UNUSUAL BRUISING OR BLEEDING  TENDERNESS IN MOUTH AND THROAT WITH OR WITHOUT PRESENCE OF ULCERS  *URINARY PROBLEMS  *BOWEL PROBLEMS  UNUSUAL RASH Items with * indicate a potential emergency and should be followed up as soon as possible.  Feel free to call the clinic should you have any questions or concerns. The clinic phone number is [phone].  Please show the CHEMO ALERT CARD at check-in to the Emergency Department and triage nurse.   

## 2020-02-19 NOTE — Progress Notes (Signed)
Anesthesia Chart Review: Elizabeth Mayer  Case: [id number] Date/Time: 02/20/20 0945   Procedure: MRI WITH ANESTHESIA OF BRAIN WITH AND WITHOUT CONTRAST (N/A )   Anesthesia type: General   Pre-op diagnosis: SECONDAY MALIGNATE NEOPLASM OF BRAIN AND SPINAL CORD   Location: [place] [facility] [place] OR   Surgeons: Radiologist, Medication, MD      DISCUSSION: Patient is a 59 year old female scheduled for the above procedure.RAD-ONC has ordered a restaging brain MRI under anesthesia. Oncology evaluation with exam 02/19/20.    In June 2021 she was seen by ENT Dr. Wilburn Cornelia for an enlarging neck mass. CT of the neck suggestive of metastatic adenopathy with extensive mediastinal adenopathy.  July chest CT showed right perihilar lung mass which encased and occluded the RLL pulmonary artery and encased RUL, RML, RLL bronchi with postobstructive RML/RLL atelectasis.  Left neck lymph node biopsy positive for metastatic carcinoma, "most consistent with metastatic lung adenocarcinoma with differential diagnosis can also  include metastatic adenosquamous carcinoma." 12/05/19 brain MRI showed four intraparenchymal supratentorial metastases with mild edema. No midline shift or other significant. Pulmonologist Dr. Lamonte Sakai referred her to oncology. She has received palliative radiotherapy to the lung mass and to the painful cervical adenopathy, last dose 12/04/19, SRS to the brain metastases under the care of Dr. Isidore Moos & Dr. Annette Stable (last 01/12/20), and palliative systemic chemotherapy ("carboplatin for an AUC of 5, Alimta 500 mg per metered squared, Keytruda 200 mg IV every 3 weeks. First dose on 12/12/19"; cycle 4 02/19/20). She has restaging CT scan of the neck/chest/abd/pelvis scheduled for 03/05/20.   Other history includes former smoker, metastatic lung cancer (10/2019, brain mets), GERD, anemia, murmur (1970's), home O2, neck surgery (2000). Syncopal event ~ 1980 and was initially told could be a MI but reported later  nothing was found to be wrong with her heart. Right IJ single lumen PowerPort placement by IR 02/16/20.  - ED visit 01/08/20 for chest tightness and dark stools. ED provider notes reviewed. Low suspicion for cardiac etiology. Dr. Pilar Plate wrote, "EKG and troponin entriely within normal limits". Pulmonology etiology felt more likely. Fecal occult negative. Out-patient follow-up advised.  PAT RN phone interview note by Henrine Screws, RN reviewed. Patient on O2 2L for about the past month. Apparently, she is still waiting for portable oxygen, but reportedly had been doing okay without O2 during car rides to her appointments. She does have SOB. As above, she did have an office visit with Heilingoetter, Cassandra, PA-C on 02/19/20 with medical oncology. Based on her note, patient was "feeling fairly well without any concerning complaints." O2 sat 97%. Labs also done, and patient felt okay to proceed with 4th chemotherapy cycle.  02/17/20 COVID-19 test negative. Anesthesia team to evaluate on the day of procedure. Although 01/08/20 ED notes suggest an EKG was reviewed during that visit, no EKG tracing noted by that date.    VS: Ht 5' 5.5" (1.664 m)   Wt 59 kg   BMI 21.30 kg/m  BP Readings from Last 3 Encounters:  02/19/20 101/79  02/16/20 140/83  01/25/20 130/67   Pulse Readings from Last 3 Encounters:  02/19/20 97  02/16/20 87  01/25/20 78    PROVIDERS: Minette Brine, FNP is PCP  Baltazar Apo, MD is pulmonologist Gery Pray, MD and Eppie Gibson, MD are RAD-ONC  Curt Bears, MD is HEM-ONC   LABS: She had labs though [facility] on 02/19/20. Results include: Lab Results  Component Value Date   WBC 7.3 02/19/2020  HGB 10.0 (L) 02/19/2020   HCT 30.6 (L) 02/19/2020   PLT 189 02/19/2020   GLUCOSE 102 (H) 02/19/2020   ALT 72 (H) 02/19/2020   AST 32 02/19/2020   NA 142 02/19/2020   K 3.8 02/19/2020   CL 106 02/19/2020   CREATININE 0.84 02/19/2020   BUN 13 02/19/2020   CO2 25 02/19/2020    TSH 1.048 02/08/2020   INR 0.9 02/16/2020   HGBA1C 6.1 (H) 01/19/2019   HGB 10, down slightly from 10.6 on 02/16/20. ALT 72, down from 122 on 02/08/20.    IMAGES: MRI Brain 01/02/20: IMPRESSION: 1. Multiple enhancing lesions scattered throughout the brain parenchyma, consistent with metastatic disease to the brain with multiple new lesions identified in the current study. 2. Leptomeningeal extension of disease appreciated in at least one lesion.  CXR 11/28/19: FINDINGS: Heart size appears within normal limits. There is complete atelectasis of the right middle lobe secondary to previously identified right perihilar lung mass. Subsegmental atelectasis involving the left lower lobe is similar to previous chest CT. No superimposed airspace consolidation identified. IMPRESSION: 1. Unchanged appearance of complete right middle lobe atelectasis and subsegmental atelectasis of the left lower lobe. No acute superimposed cardiopulmonary abnormalities noted.  PET Scan 11/20/19: IMPRESSION: 1. Right hilar mass with extensive lower neck and thoracic adenopathy. The lower neck nodal involvement extends beyond scalene/supraclavicular nodes accordingly constitutes extra thoracic metastatic disease. Appearance compatible with T3 N3 M1 disease (stage IV). 2. The left adrenal nodule is not overtly hypermetabolic but only measures 0.8 by 1.2 cm. This nodule was not present on the CT abdomen from 10/06/2017 and has a density of 16 Hounsfield units precontrast which is indeterminate. Surveillance recommended. 3.  Aortic Atherosclerosis (ICD10-I70.0). 4. Trace left pleural effusion.   EKG: There is no EKG racing viewable fro 01/08/20 ED visit. Last EKG seen if from 01/19/19 and showed SR, non-specific T wave abnormality, low voltage, possible pulmonary disease.    CV: N/A  Past Medical History:  Diagnosis Date  . Anemia   . Angina 1982   related to stress  . Asthma    in the past, not  now- never inhlaer use  . Complication of anesthesia    states anesthesia made her hair fall out  . Constipation   . Fatigue   . GERD (gastroesophageal reflux disease)   . Heart murmur 1970s  . Ingrown toenail   . Lung cancer ([place]) 10/2019   metastatic disease to the brain  . On home oxygen therapy    2.2 lpm, 24 hours a day  . Past heart attack 1980-1981   pt states she passed out and woke up in hospital- told she had heart attack, but then dr said he couldn't find anything wrong.  . Pneumonia     Past Surgical History:  Procedure Laterality Date  . ingrown toe nail surgery    . IR IMAGING GUIDED PORT INSERTION  02/16/2020  . NECK SURGERY  2000   Disc removed from neck   . RADIOLOGY WITH ANESTHESIA N/A 12/05/2019   Procedure: MRI BRAIN WITH AND WITHOUT CONTRAST;  Surgeon: Radiologist, Medication, MD;  Location: [place];  Service: Radiology;  Laterality: N/A;  . RADIOLOGY WITH ANESTHESIA N/A 01/02/2020   Procedure: MRI BRAIN WITH AND WITHOUT CONTRAST;  Surgeon: Radiologist, Medication, MD;  Location: [place];  Service: Radiology;  Laterality: N/A;    MEDICATIONS: No current facility-administered medications for this encounter.   Marland Kitchen acetaminophen (TYLENOL) 500 MG tablet  . albuterol (VENTOLIN  HFA) 108 (90 Base) MCG/ACT inhaler  . Artificial Saliva (BIOTENE DRY MOUTH MOISTURIZING) SOLN  . budesonide-formoterol (SYMBICORT) 80-4.5 MCG/ACT inhaler  . Carboxymethylcellul-Glycerin (LUBRICATING EYE DROPS OP)  . Cream Base (WOUND CARE) CREA  . dexamethasone (DECADRON) 4 MG tablet  . DIGESTIVE ENZYMES PO  . docusate sodium (COLACE) 100 MG capsule  . fexofenadine (ALLEGRA) 180 MG tablet  . fluticasone (FLONASE) 50 MCG/ACT nasal spray  . fluticasone (FLOVENT HFA) 44 MCG/ACT inhaler  . folic acid (FOLVITE) 1 MG tablet  . guaiFENesin (MUCINEX) 600 MG 12 hr tablet  . HYDROcodone-acetaminophen (HYCET) 7.5-325 mg/15 ml solution  . lidocaine (XYLOCAINE) 2 % solution  . Multiple Vitamin  (MULTIVITAMIN WITH MINERALS) TABS tablet  . neomycin-bacitracin-polymyxin (NEOSPORIN) ointment  . neomycin-polymyxin-hydrocortisone (CORTISPORIN) OTIC solution  . omeprazole (PRILOSEC) 20 MG capsule  . ondansetron (ZOFRAN ODT) 8 MG disintegrating tablet  . ondansetron (ZOFRAN) 4 MG tablet  . prochlorperazine (COMPAZINE) 10 MG tablet  . HYDROcodone-acetaminophen (NORCO/VICODIN) 5-325 MG tablet  . lidocaine-prilocaine (EMLA) cream  . LORazepam (ATIVAN) 0.5 MG tablet  . LORazepam (ATIVAN) 2 MG tablet  . orphenadrine (NORFLEX) 100 MG tablet  . potassium chloride 20 MEQ/15ML (10%) SOLN  . potassium chloride SA (KLOR-CON) 20 MEQ tablet  . sucralfate (CARAFATE) 1 g tablet   . sodium chloride flush (NS) 0.9 % injection 10 mL    Myra Gianotti, PA-C Surgical Short Stay/Anesthesiology [facility] Phone [phone] [facility] Phone [phone] 02/19/2020 3:51 PM

## 2020-02-19 NOTE — Patient Instructions (Signed)

## 2020-02-20 ENCOUNTER — Encounter (HOSPITAL_COMMUNITY): Payer: Self-pay | Admitting: Radiation Oncology

## 2020-02-20 ENCOUNTER — Ambulatory Visit: Payer: Managed Care, Other (non HMO) | Admitting: Emergency Medicine

## 2020-02-20 ENCOUNTER — Ambulatory Visit (HOSPITAL_COMMUNITY): Payer: Managed Care, Other (non HMO) | Admitting: Vascular Surgery

## 2020-02-20 ENCOUNTER — Other Ambulatory Visit: Payer: Self-pay

## 2020-02-20 ENCOUNTER — Ambulatory Visit (HOSPITAL_COMMUNITY)
Admission: RE | Admit: 2020-02-20 | Discharge: 2020-02-20 | Disposition: A | Discharge status: Home / Self Care | Payer: Managed Care, Other (non HMO) | Attending: Radiation Oncology | Admitting: Radiation Oncology

## 2020-02-20 ENCOUNTER — Telehealth: Payer: Self-pay

## 2020-02-20 ENCOUNTER — Ambulatory Visit (HOSPITAL_COMMUNITY)
Admission: RE | Admit: 2020-02-20 | Discharge: 2020-02-20 | Disposition: A | Discharge status: Home / Self Care | Payer: Managed Care, Other (non HMO) | Source: Ambulatory Visit | Attending: Radiation Oncology | Admitting: Radiation Oncology

## 2020-02-20 ENCOUNTER — Encounter (HOSPITAL_COMMUNITY)
Admission: RE | Disposition: A | Discharge status: Home / Self Care | Payer: Self-pay | Source: Home / Self Care | Attending: Radiation Oncology

## 2020-02-20 ENCOUNTER — Inpatient Hospital Stay: Payer: Managed Care, Other (non HMO)

## 2020-02-20 DIAGNOSIS — Z79899 Other long term (current) drug therapy: Secondary | ICD-10-CM | POA: Insufficient documentation

## 2020-02-20 DIAGNOSIS — C7931 Secondary malignant neoplasm of brain: Secondary | ICD-10-CM | POA: Diagnosis not present

## 2020-02-20 DIAGNOSIS — C7949 Secondary malignant neoplasm of other parts of nervous system: Secondary | ICD-10-CM

## 2020-02-20 DIAGNOSIS — C770 Secondary and unspecified malignant neoplasm of lymph nodes of head, face and neck: Secondary | ICD-10-CM | POA: Insufficient documentation

## 2020-02-20 DIAGNOSIS — F40240 Claustrophobia: Secondary | ICD-10-CM | POA: Diagnosis not present

## 2020-02-20 DIAGNOSIS — Z87891 Personal history of nicotine dependence: Secondary | ICD-10-CM | POA: Insufficient documentation

## 2020-02-20 DIAGNOSIS — Z7951 Long term (current) use of inhaled steroids: Secondary | ICD-10-CM | POA: Diagnosis not present

## 2020-02-20 DIAGNOSIS — C3490 Malignant neoplasm of unspecified part of unspecified bronchus or lung: Secondary | ICD-10-CM | POA: Insufficient documentation

## 2020-02-20 HISTORY — PX: RADIOLOGY WITH ANESTHESIA: SHX6223

## 2020-02-20 HISTORY — DX: Dependence on supplemental oxygen: Z99.81

## 2020-02-20 HISTORY — DX: Other fatigue: R53.83

## 2020-02-20 SURGERY — MRI WITH ANESTHESIA
Anesthesia: General

## 2020-02-20 MED ORDER — MIDAZOLAM HCL 5 MG/5ML IJ SOLN
INTRAMUSCULAR | Status: DC | PRN
  Administered 2020-02-20: 1 mg via INTRAVENOUS

## 2020-02-20 MED ORDER — LACTATED RINGERS IV SOLN: INTRAVENOUS | Status: DC

## 2020-02-20 MED ORDER — PROPOFOL 10 MG/ML IV BOLUS
INTRAVENOUS | Status: DC | PRN
  Administered 2020-02-20: 150 mg via INTRAVENOUS

## 2020-02-20 MED ORDER — ONDANSETRON HCL 4 MG/2ML IJ SOLN
INTRAMUSCULAR | Status: DC | PRN
  Administered 2020-02-20: 4 mg via INTRAVENOUS

## 2020-02-20 MED ORDER — SUCCINYLCHOLINE CHLORIDE 20 MG/ML IJ SOLN
INTRAMUSCULAR | Status: DC | PRN
  Administered 2020-02-20: 100 mg via INTRAVENOUS

## 2020-02-20 MED ORDER — CHLORHEXIDINE GLUCONATE 0.12 % MT SOLN
15.0000 mL | Freq: Once | OROMUCOSAL | Status: AC
  Administered 2020-02-20: 15 mL via OROMUCOSAL
  Filled 2020-02-20: qty 15

## 2020-02-20 MED ORDER — DEXAMETHASONE SODIUM PHOSPHATE 10 MG/ML IJ SOLN
INTRAMUSCULAR | Status: DC | PRN
  Administered 2020-02-20: 4 mg via INTRAVENOUS

## 2020-02-20 MED ORDER — PHENYLEPHRINE HCL-NACL 10-0.9 MG/250ML-% IV SOLN
INTRAVENOUS | Status: DC | PRN
  Administered 2020-02-20: 25 ug/min via INTRAVENOUS

## 2020-02-20 MED ORDER — MIDAZOLAM HCL 2 MG/2ML IJ SOLN
INTRAMUSCULAR | Status: AC
  Filled 2020-02-20: qty 2

## 2020-02-20 MED ORDER — GADOBUTROL 1 MMOL/ML IV SOLN
6.0000 mL | Freq: Once | INTRAVENOUS | Status: AC | PRN
  Administered 2020-02-20: 6 mL via INTRAVENOUS

## 2020-02-20 MED ORDER — PHENYLEPHRINE HCL (PRESSORS) 10 MG/ML IV SOLN
INTRAVENOUS | Status: DC | PRN
  Administered 2020-02-20 (×2): 80 ug via INTRAVENOUS
  Administered 2020-02-20: 120 ug via INTRAVENOUS

## 2020-02-20 MED ORDER — FENTANYL CITRATE (PF) 100 MCG/2ML IJ SOLN
INTRAMUSCULAR | Status: AC
  Filled 2020-02-20: qty 2

## 2020-02-20 MED ORDER — LIDOCAINE 2% (20 MG/ML) 5 ML SYRINGE
INTRAMUSCULAR | Status: DC | PRN
  Administered 2020-02-20: 100 mg via INTRAVENOUS

## 2020-02-20 MED ORDER — ORAL CARE MOUTH RINSE: 15.0000 mL | Freq: Once | OROMUCOSAL | Status: AC

## 2020-02-20 NOTE — Anesthesia Postprocedure Evaluation (Signed)
Anesthesia Post Note  Patient: Karie A Umeda  Procedure(s) Performed: MRI WITH ANESTHESIA OF BRAIN WITH AND WITHOUT CONTRAST (N/A )     Patient location during evaluation: PACU Anesthesia Type: General Level of consciousness: awake Pain management: pain level controlled Vital Signs Assessment: post-procedure vital signs reviewed and stable Respiratory status: spontaneous breathing Cardiovascular status: stable Postop Assessment: no apparent nausea or vomiting Anesthetic complications: no   No complications documented.  Last Vitals:  Vitals:   02/20/20 1241 02/20/20 1255  BP: (!) 153/86 (!) 155/82  Pulse: 77 76  Resp: 20 20  Temp: (!) 36.2 C   SpO2: 98% 96%    Last Pain:  Vitals:   02/20/20 1241  TempSrc:   PainSc: 0-No pain                 Dustina Scoggin

## 2020-02-20 NOTE — Anesthesia Procedure Notes (Signed)
Procedure Name: Intubation Date/Time: 02/20/2020 11:11 AM Performed by: Inda Coke, CRNA Pre-anesthesia Checklist: Patient identified, Emergency Drugs available, Suction available and Patient being monitored Patient Re-evaluated:Patient Re-evaluated prior to induction Oxygen Delivery Method: Circle System Utilized Preoxygenation: Pre-oxygenation with 100% oxygen Induction Type: IV induction Ventilation: Mask ventilation without difficulty Laryngoscope Size: Mac and 3 Grade View: Grade II Tube type: Oral Tube size: 7.0 mm Number of attempts: 1 Airway Equipment and Method: Stylet and Oral airway Placement Confirmation: ETT inserted through vocal cords under direct vision,  positive ETCO2 and breath sounds checked- equal and bilateral Secured at: 22 cm Tube secured with: Tape Dental Injury: Teeth and Oropharynx as per pre-operative assessment

## 2020-02-20 NOTE — Telephone Encounter (Signed)
Nutrition  Patient scheduled for phone nutrition follow-up visit today.  Unable to reach patient by phone. Voicemail left with call back number.   Zanyla Klebba B. Zenia Resides, [place], [place] Registered Dietitian [phone] (mobile)

## 2020-02-20 NOTE — Transfer of Care (Signed)
Immediate Anesthesia Transfer of Care Note  Patient: Elizabeth Mayer  Procedure(s) Performed: MRI WITH ANESTHESIA OF BRAIN WITH AND WITHOUT CONTRAST (N/A )  Patient Location: PACU  Anesthesia Type:General  Level of Consciousness: awake and alert   Airway & Oxygen Therapy: Patient Spontanous Breathing and Patient connected to nasal cannula oxygen  Post-op Assessment: Report given to RN, Post -op Vital signs reviewed and stable and Patient moving all extremities X 4  Post vital signs: Reviewed and stable  Last Vitals:  Vitals Value Taken Time  BP 153/86 02/20/20 1241  Temp    Pulse 78 02/20/20 1243  Resp 29 02/20/20 1243  SpO2 100 % 02/20/20 1243  Vitals shown include unvalidated device data.  Last Pain:  Vitals:   02/20/20 1241  TempSrc:   PainSc: (P) 0-No pain      Patients Stated Pain Goal: 3 (66/59/93 5701)  Complications: No complications documented.

## 2020-02-21 ENCOUNTER — Encounter: Payer: Self-pay | Admitting: Gastroenterology

## 2020-02-21 ENCOUNTER — Ambulatory Visit: Payer: Managed Care, Other (non HMO) | Admitting: Gastroenterology

## 2020-02-21 VITALS — BP 104/58 | HR 100 | Ht 64.75 in | Wt 147.4 lb

## 2020-02-21 DIAGNOSIS — C3490 Malignant neoplasm of unspecified part of unspecified bronchus or lung: Secondary | ICD-10-CM

## 2020-02-21 DIAGNOSIS — R1310 Dysphagia, unspecified: Secondary | ICD-10-CM | POA: Diagnosis not present

## 2020-02-21 DIAGNOSIS — Z1211 Encounter for screening for malignant neoplasm of colon: Secondary | ICD-10-CM | POA: Diagnosis not present

## 2020-02-21 NOTE — Progress Notes (Signed)
HPI :  59 year old female with stage IV lung cancer including metastasis to the brain, supplemental oxygen use, dysphagia, referred by Minette Brine FNP for colon cancer screening.  Patient was diagnosed with stage 4 lung cancer  in July 2021 - presented with a right perihilar lung mass which encases and occludes the right lower lobe pulmonary artery. There is also involvement of the right upper lobe, right middle lobe, and right lower lobe bronchi, with associated bilateral mediastinal, right axillary, cervical, and supraclavicular lymphadenopathy.She also has metastatic disease to the brain.  She is followed by oncology.  She has received palliative radiotherapy to the lung mass, SRS treatment for brain mets, palliative systemic chemotherapy with carboplatin, Alimta, Keytruda.  She has completed 3 cycles and about to start her fourth.  She had a PET scan in August of this summer.  There is no uptake in her bowel that she is aware of.  She states she generally has not noticed any blood in her stool.  She states she thinks she had a dark stool one time several months ago.  No bright red blood in the stools or dark stool since then.  Has an occasional gas pain as she eats a lot of beans but otherwise bowels are normal for her.  She is never had a prior colonoscopy.  No family history of colon cancer.  She has had decreased appetite with her chemotherapy.  She was given some steroids and that is stimulate her appetite and she is eating better.  Her weight is stable.  She was prescribed omeprazole to take but she is not really needing to take that much.  She does have some dysphagia in her neck she thinks from the malignancy.  With radiation and treatment with chemotherapy she thinks this is slowly getting better.  She has had 2 stools negative for occult blood last October.  PET scan 11/20/19 - IMPRESSION: 1. Right hilar mass with extensive lower neck and thoracic adenopathy. The lower neck nodal  involvement extends beyond scalene/supraclavicular nodes accordingly constitutes extra thoracic metastatic disease. Appearance compatible with T3 N3 M1 disease (stage IV). 2. The left adrenal nodule is not overtly hypermetabolic but only measures 0.8 by 1.2 cm. This nodule was not present on the CT abdomen from 10/06/2017 and has a density of 16 Hounsfield units precontrast which is indeterminate. Surveillance recommended. 3.  Aortic Atherosclerosis (ICD10-I70.0). 4. Trace left pleural effusion.  MRI brain 02/20/20 - IMPRESSION: 1. Overall satisfactory initial post treatment appearance of the brain. A total of eleven treated lesions remain visible and are all stable or smaller. And the leptomeningeal enhancement associated with the right parietal lesion has also regressed. It is unclear whether one additional punctate lesion in the left parietal lobe (series 1150, image 212) was treated.  2. Possibly one new punctate enhancing metastasis along the medial anterior left frontal lobe on series 1150, image 141. Recommend attention to this lesion on the next follow-up.  3. No cerebral edema or intracranial mass effect.    Past Medical History:  Diagnosis Date  . Anemia   . Angina 1982   related to stress  . Asthma    in the past, not now- never inhlaer use  . Complication of anesthesia    states anesthesia made her hair fall out  . Constipation   . Fatigue   . GERD (gastroesophageal reflux disease)   . Heart murmur 1970s  . Ingrown toenail   . Lung cancer ([place]) 10/2019   metastatic  disease to the brain  . On home oxygen therapy    2.2 lpm, 24 hours a day  . Past heart attack 1980-1981   pt states she passed out and woke up in hospital- told she had heart attack, but then dr said he couldn't find anything wrong.  . Pneumonia      Past Surgical History:  Procedure Laterality Date  . CERVICAL DISC SURGERY  2000   Disc removed from neck   . ingrown toe nail surgery  Bilateral   . IR IMAGING GUIDED PORT INSERTION  02/16/2020  . RADIOLOGY WITH ANESTHESIA N/A 12/05/2019   Procedure: MRI BRAIN WITH AND WITHOUT CONTRAST;  Surgeon: Radiologist, Medication, MD;  Location: [facility];  Service: Radiology;  Laterality: N/A;  . RADIOLOGY WITH ANESTHESIA N/A 01/02/2020   Procedure: MRI BRAIN WITH AND WITHOUT CONTRAST;  Surgeon: Radiologist, Medication, MD;  Location: [facility];  Service: Radiology;  Laterality: N/A;  . RADIOLOGY WITH ANESTHESIA N/A 02/20/2020   Procedure: MRI WITH ANESTHESIA OF BRAIN WITH AND WITHOUT CONTRAST;  Surgeon: Radiologist, Medication, MD;  Location: [facility];  Service: Radiology;  Laterality: N/A;   Family History  Problem Relation Age of Onset  . Arthritis Mother   . Heart failure Father    Social History   Tobacco Use  . Smoking status: Former Smoker    Packs/day: 1.00    Years: 20.00    Pack years: 20.00    Types: Cigarettes    Start date: 74    Quit date: 2001    Years since quitting: 20.8  . Smokeless tobacco: Never Used  Vaping Use  . Vaping Use: Never used  Substance Use Topics  . Alcohol use: No  . Drug use: No   Current Outpatient Medications  Medication Sig Dispense Refill  . acetaminophen (TYLENOL) 500 MG tablet Take 1,000 mg by mouth every 6 (six) hours as needed for moderate pain.     Marland Kitchen albuterol (VENTOLIN HFA) 108 (90 Base) MCG/ACT inhaler Inhale 2 puffs into the lungs every 6 (six) hours as needed for wheezing or shortness of breath. 18 g 6  . Artificial Saliva (BIOTENE DRY MOUTH MOISTURIZING) SOLN Use as directed 1 Dose in the mouth or throat as needed (dry mouth).    . budesonide-formoterol (SYMBICORT) 80-4.5 MCG/ACT inhaler Inhale 2 puffs into the lungs in the morning and at bedtime. 1 each 11  . Carboxymethylcellul-Glycerin (LUBRICATING EYE DROPS OP) Place 1 drop into both eyes daily as needed (irritation).    . Cream Base (WOUND CARE) CREA Apply 1 application topically as needed (wound care).    Marland Kitchen dexamethasone  (DECADRON) 4 MG tablet Take 1 tablet TID through 10-16, then 1 tablet BID through 10-30, then 1 tablet daily until you see Dr. Isidore Moos to review MRI results. (Patient taking differently: Take 4 mg by mouth daily. ) 80 tablet 0  . DIGESTIVE ENZYMES PO Take 0.5-1 tablets by mouth 3 (three) times daily before meals.    . docusate sodium (COLACE) 100 MG capsule Take 100 mg by mouth daily as needed for mild constipation.     . fexofenadine (ALLEGRA) 180 MG tablet Take 180 mg by mouth at bedtime.    . fluticasone (FLONASE) 50 MCG/ACT nasal spray Place 2 sprays into both nostrils daily.    . fluticasone (FLOVENT HFA) 44 MCG/ACT inhaler Inhale 2 puffs into the lungs 2 (two) times daily. 1 each 2  . folic acid (FOLVITE) 1 MG tablet Take 1 tablet (1 mg total)  by mouth daily. 30 tablet 4  . guaiFENesin (MUCINEX) 600 MG 12 hr tablet Take 600 mg by mouth 2 (two) times daily.     Marland Kitchen HYDROcodone-acetaminophen (HYCET) 7.5-325 mg/15 ml solution Take 10 mLs by mouth 2 (two) times daily. For pain from cancer and radiation reactions 473 mL 0  . HYDROcodone-acetaminophen (NORCO/VICODIN) 5-325 MG tablet Take 1-2 tablets by mouth every 6 (six) hours as needed for moderate pain. 60 tablet 0  . lidocaine (XYLOCAINE) 2 % solution Use as directed 15 mLs in the mouth or throat as needed for mouth pain. Swallow 30 min prior to meals and at bedtime for throat/esophagus pain 250 mL 1  . lidocaine-prilocaine (EMLA) cream Apply 1 application topically as needed. (Patient taking differently: Apply 1 application topically as needed (port access). ) 30 g 2  . LORazepam (ATIVAN) 0.5 MG tablet Please take 1 tablet 30 minutes before your scan as needed for anxiety 2 tablet 0  . LORazepam (ATIVAN) 2 MG tablet Take 1 tab 30 min before radiation treatment to the brain. 1 tablet 0  . Multiple Vitamin (MULTIVITAMIN WITH MINERALS) TABS tablet Take 1 tablet by mouth daily.    Marland Kitchen neomycin-bacitracin-polymyxin (NEOSPORIN) ointment Apply 1 application  topically daily as needed for wound care.    . neomycin-polymyxin-hydrocortisone (CORTISPORIN) OTIC solution Apply 1-2 drops to toe after soaking BID (Patient taking differently: 1-2 drops See admin instructions. Apply 1-2 drops to toe after soaking twice daily) 10 mL 1  . omeprazole (PRILOSEC) 20 MG capsule Take 1 capsule (20 mg total) by mouth daily. Take as long as you are on dexamethasone to protect from stomach irritation. 30 capsule 2  . ondansetron (ZOFRAN ODT) 8 MG disintegrating tablet Take 1 tablet (8 mg total) by mouth every 8 (eight) hours as needed for nausea or vomiting. 30 tablet 3  . ondansetron (ZOFRAN) 4 MG tablet Take 1 tablet (4 mg total) by mouth every 8 (eight) hours as needed for nausea or vomiting. 20 tablet 0  . orphenadrine (NORFLEX) 100 MG tablet Take 1 tablet (100 mg total) by mouth 2 (two) times daily as needed for muscle spasms. 60 tablet 1  . OXYGEN Inhale 2.2 L/min into the lungs continuous.    . potassium chloride 20 MEQ/15ML (10%) SOLN Take 15 mLs (20 mEq total) by mouth 2 (two) times daily. (Patient taking differently: Take 20 mEq by mouth daily. ) 210 mL 0  . potassium chloride SA (KLOR-CON) 20 MEQ tablet Take 1 tablet (20 mEq total) by mouth 2 (two) times daily. 14 tablet 0  . prochlorperazine (COMPAZINE) 10 MG tablet Take 1 tablet (10 mg total) by mouth every 6 (six) hours as needed. (Patient taking differently: Take 10 mg by mouth every 6 (six) hours as needed for nausea or vomiting. ) 30 tablet 2  . sucralfate (CARAFATE) 1 g tablet Take 1 tablet (1 g total) by mouth 4 (four) times daily -  with meals and at bedtime. 90 tablet 2   No current facility-administered medications for this visit.   Allergies  Allergen Reactions  . Pseudoephedrine Hypertension  . Gabapentin Other (See Comments)    Raise blood pressure and red rings around eyes Blood vessels popped in her eyes  . Mobic [Meloxicam] Swelling    Inflamed the area that has inflammation and stabbing  pains in the area  . Penicillins Hives     Review of Systems: All systems reviewed and negative except where noted in HPI.   Lab Results  Component Value Date   WBC 7.3 02/19/2020   HGB 10.0 (L) 02/19/2020   HCT 30.6 (L) 02/19/2020   MCV 94.4 02/19/2020   PLT 189 02/19/2020    Lab Results  Component Value Date   CREATININE 0.84 02/19/2020   BUN 13 02/19/2020   NA 142 02/19/2020   K 3.8 02/19/2020   CL 106 02/19/2020   CO2 25 02/19/2020   .   Physical Exam: BP (!) 104/58 (BP Location: Left Arm, Patient Position: Sitting, Cuff Size: Normal)   Pulse 100   Ht 5' 4.75" (1.645 m)   Wt 147 lb 6 oz (66.8 kg)   BMI 24.71 kg/m  Constitutional: Pleasant,  female in no acute distress. HEENT: Conjunctivae are normal. No scleral icterus. Cardiovascular: Normal rate, regular rhythm.  Pulmonary/chest: Effort normal and breath sounds normal.  Abdominal: Soft, nondistended, nontender.   Extremities: no edema Neurological: Alert and oriented to person place and time. Skin: Skin is warm and dry. No rashes noted. Psychiatric: Normal mood and affect. Behavior is normal.   ASSESSMENT AND PLAN: 59 year old female here for new patient assessment of the following:  Colon cancer screening Dysphagia Metastatic lung cancer  History as outlined above.  The patient has never had colon cancer screening and is due for colon cancer screening however, she has significant comorbidities and dealing with metastatic lung cancer right now.  She is receiving chemotherapy and radiation therapy that is palliative.  She had 2 stools negative for occult blood, PET scan did not show any obvious uptake in her colon. No obvious bleeding symptoms. We discussed options.  She is on supplemental oxygen currently in light of her pulmonary status.  She is at higher than average risk for anesthesia.  If she wishes to pursue a colonoscopy at this time her case would need to be done at the hospital for anesthesia  support.  After discussion of these options, given her significant comorbidities and ongoing course with lung cancer I think she should focus on that for now, I do not think this is a great time to be performing routine colon cancer screening given her pulmonary status and sounds like her malignancy is severe with palliative intent at this point.  Her dysphagia is likely due to malignancy and appears to be improving with treatment of that and will monitor for now.  We discussed the situation, patient will complete her malignancy treatment with oncology first and pending that result, can consider colonoscopy in the future her malignancy is in remission and oncology feels she would benefit from an exam. Patient was in agreement with the plan, all questions answered.   [place] Cellar, MD [facility]  CC: Minette Brine, FNP

## 2020-02-21 NOTE — Patient Instructions (Signed)
If you are age 61 or older, your body mass index should be between 23-30. Your Body mass index is 24.71 kg/m. If this is out of the aforementioned range listed, please consider follow up with your Primary Care Provider.  If you are age 26 or younger, your body mass index should be between 19-25. Your Body mass index is 24.71 kg/m. If this is out of the aformentioned range listed, please consider follow up with your Primary Care Provider.   Thank you for entrusting me with your care and for choosing [facility], Dr. [place] Cellar

## 2020-02-22 ENCOUNTER — Telehealth: Payer: Self-pay | Admitting: Physician Assistant

## 2020-02-22 ENCOUNTER — Other Ambulatory Visit: Payer: Self-pay

## 2020-02-22 ENCOUNTER — Inpatient Hospital Stay: Payer: Managed Care, Other (non HMO)

## 2020-02-22 ENCOUNTER — Telehealth: Payer: Self-pay

## 2020-02-22 ENCOUNTER — Telehealth: Payer: Self-pay | Admitting: Emergency Medicine

## 2020-02-22 DIAGNOSIS — C3401 Malignant neoplasm of right main bronchus: Secondary | ICD-10-CM | POA: Diagnosis not present

## 2020-02-22 DIAGNOSIS — C7931 Secondary malignant neoplasm of brain: Secondary | ICD-10-CM

## 2020-02-22 DIAGNOSIS — Z95828 Presence of other vascular implants and grafts: Secondary | ICD-10-CM | POA: Insufficient documentation

## 2020-02-22 DIAGNOSIS — C3491 Malignant neoplasm of unspecified part of right bronchus or lung: Secondary | ICD-10-CM

## 2020-02-22 LAB — CMP (CANCER CENTER ONLY)
ALT: 66 U/L — ABNORMAL HIGH (ref 0–44)
AST: 34 U/L (ref 15–41)
Albumin: 3.3 g/dL — ABNORMAL LOW (ref 3.5–5.0)
Alkaline Phosphatase: 57 U/L (ref 38–126)
Anion gap: 10 (ref 5–15)
BUN: 18 mg/dL (ref 8–23)
CO2: 29 mmol/L (ref 22–32)
Calcium: 8.9 mg/dL (ref 8.9–10.3)
Chloride: 102 mmol/L (ref 98–111)
Creatinine: 0.8 mg/dL (ref 0.44–1.00)
GFR, Estimated: >60 mL/min (ref >60)
Glucose, Bld: 130 mg/dL — ABNORMAL HIGH (ref 70–99)
Potassium: 3.8 mmol/L (ref 3.5–5.1)
Sodium: 141 mmol/L (ref 135–145)
Total Bilirubin: 0.4 mg/dL (ref 0.3–1.2)
Total Protein: 6.5 g/dL (ref 6.5–8.1)

## 2020-02-22 LAB — CBC WITH DIFFERENTIAL (CANCER CENTER ONLY)
Abs Immature Granulocytes: 0.03 10*3/uL (ref 0.00–0.07)
Basophils Absolute: 0 10*3/uL (ref 0.0–0.1)
Basophils Relative: 0 %
Eosinophils Absolute: 0 10*3/uL (ref 0.0–0.5)
Eosinophils Relative: 0 %
HCT: 33.1 % — ABNORMAL LOW (ref 36.0–46.0)
Hemoglobin: 10.7 g/dL — ABNORMAL LOW (ref 12.0–15.0)
Immature Granulocytes: 0 %
Lymphocytes Relative: 8 %
Lymphs Abs: 0.7 10*3/uL (ref 0.7–4.0)
MCH: 31 pg (ref 26.0–34.0)
MCHC: 32.3 g/dL (ref 30.0–36.0)
MCV: 95.9 fL (ref 80.0–100.0)
Monocytes Absolute: 0.2 10*3/uL (ref 0.1–1.0)
Monocytes Relative: 2 %
Neutro Abs: 7.5 10*3/uL (ref 1.7–7.7)
Neutrophils Relative %: 90 %
Platelet Count: 242 10*3/uL (ref 150–400)
RBC: 3.45 MIL/uL — ABNORMAL LOW (ref 3.87–5.11)
RDW: 20.9 % — ABNORMAL HIGH (ref 11.5–15.5)
WBC Count: 8.4 10*3/uL (ref 4.0–10.5)
nRBC: 0 % (ref 0.0–0.2)

## 2020-02-22 MED ORDER — DOXYCYCLINE HYCLATE 100 MG PO TABS: 100.0000 mg | ORAL_TABLET | Freq: Two times a day (BID) | ORAL | 0 refills | Status: DC

## 2020-02-22 MED ORDER — SODIUM CHLORIDE 0.9% FLUSH
10.0000 mL | Freq: Once | INTRAVENOUS | Status: AC
  Administered 2020-02-22: 10 mL
  Filled 2020-02-22: qty 10

## 2020-02-22 MED ORDER — HEPARIN SOD (PORK) LOCK FLUSH 100 UNIT/ML IV SOLN
500.0000 [IU] | Freq: Once | INTRAVENOUS | Status: AC
  Administered 2020-02-22: 500 [IU]
  Filled 2020-02-22: qty 5

## 2020-02-22 NOTE — Telephone Encounter (Signed)
Scheduled per los. Called and left msg. Mailed printout  °

## 2020-02-22 NOTE — Telephone Encounter (Signed)
Pt states she has chemo 02/19/20 and is having SOB and phlem in her chest. Mild CP across upper chest and feels tight.  Discussed with Dr. Julien Nordmann and Rubin Payor PA-C who advised for pt to contact her PULM provider and be sure to get her CT Scan.  Pt advised and expressed understanding of this information.

## 2020-02-22 NOTE — Telephone Encounter (Signed)
Please have her start doxycycline 100mg  bid x 7d  She likely needs to be seen in office w a CXR, but safest plan would be for her to be COVID tested before any OV. Would recommend testing and then OV w RB or APP.

## 2020-02-22 NOTE — Telephone Encounter (Signed)
Called and spoke to pt. Informed her of the recs per Dr. Lamonte Sakai. Script sent to preferred pharmacy. Pt aware to get COVID test, information given to obtain COVID test through [facility] website. Will keep encounter open to follow up on COVID test.

## 2020-02-22 NOTE — Telephone Encounter (Signed)
Called and spoke to pt. Pt c/o prod cough with green mucus, chest congestion, chest tightness, increase in SOB x 3 days. Pt is not COVID vaccinated and has not had any contact with anyone known to have covid. Pt c/o chills but her oral temp is 97.8 (taken while on the phone). Pt not taking any OTC meds. Pt taking Symbicort as directed. Pt last seen on 01/15/2020.   Dr. Lamonte Sakai, please advise. Thanks.

## 2020-02-23 ENCOUNTER — Ambulatory Visit
Admission: RE | Admit: 2020-02-23 | Discharge: 2020-02-23 | Disposition: A | Discharge status: Home / Self Care | Payer: Managed Care, Other (non HMO) | Source: Ambulatory Visit | Attending: Radiation Oncology | Admitting: Radiation Oncology

## 2020-02-23 ENCOUNTER — Other Ambulatory Visit: Payer: Managed Care, Other (non HMO)

## 2020-02-23 ENCOUNTER — Telehealth: Payer: Self-pay | Admitting: Radiation Therapy

## 2020-02-23 DIAGNOSIS — C7931 Secondary malignant neoplasm of brain: Secondary | ICD-10-CM

## 2020-02-23 DIAGNOSIS — Z20822 Contact with and (suspected) exposure to covid-19: Secondary | ICD-10-CM

## 2020-02-23 NOTE — Telephone Encounter (Signed)
Pt had covid test this afternoon per chart.  Results are still in process.  Will keep encounter open to follow up on results.

## 2020-02-23 NOTE — Progress Notes (Signed)
Radiation Oncology         [phone] ________________________________  Name: Elizabeth Mayer MRN: [id number]  Date: 02/23/2020  DOB: 1960/12/10  Follow-Up Visit Note  Outpatient by telephone to maximize safety during the pandemic.  MyChart video was not obtainable.   CC: Minette Brine, FNP  Minette Brine, FNP  Diagnosis and Prior Radiotherapy:    ICD-10-CM   1. Brain metastases [facility])  C79.31     Radiation Treatment Date for Brain SRS:   01/12/2020 Site Technique Total Dose (Gy) Dose per Fx (Gy) Completed Fx Beam Energies         Brain: Brain_SRS (22 targets) IMRT 20/20 20 1/1 6XFFF    CHIEF COMPLAINT: Here for follow-up and surveillance of lung cancer metastatic to brain  Narrative:  The patient and I are speaking by phone today for routine follow-up. No HA or nausea. Occasioanal sees "flashes of light" sporadically. Not dizzy or lightheaded.  Neurologically she feels quite well.           She underwent a Covid lab test today due to respiratory symptoms.  Results are pending.  She reports she feels better since she started antibiotics.  Dr. Lamonte Sakai is aware.        At the end of the month she undergoes CT imaging of her chest and will follow up with medical oncology shortly thereafter.          ALLERGIES:  is allergic to pseudoephedrine, gabapentin, mobic [meloxicam], and penicillins.  Meds: Current Outpatient Medications  Medication Sig Dispense Refill  . acetaminophen (TYLENOL) 500 MG tablet Take 1,000 mg by mouth every 6 (six) hours as needed for moderate pain.     Marland Kitchen albuterol (VENTOLIN HFA) 108 (90 Base) MCG/ACT inhaler Inhale 2 puffs into the lungs every 6 (six) hours as needed for wheezing or shortness of breath. 18 g 6  . Artificial Saliva (BIOTENE DRY MOUTH MOISTURIZING) SOLN Use as directed 1 Dose in the mouth or throat as needed (dry mouth).    . budesonide-formoterol (SYMBICORT) 80-4.5 MCG/ACT inhaler Inhale 2 puffs into the lungs in the morning and at bedtime. 1  each 11  . Carboxymethylcellul-Glycerin (LUBRICATING EYE DROPS OP) Place 1 drop into both eyes daily as needed (irritation).    . Cream Base (WOUND CARE) CREA Apply 1 application topically as needed (wound care).    Marland Kitchen dexamethasone (DECADRON) 4 MG tablet Take 1 tablet TID through 10-16, then 1 tablet BID through 10-30, then 1 tablet daily until you see Dr. Isidore Moos to review MRI results. (Patient taking differently: Take 4 mg by mouth daily. ) 80 tablet 0  . DIGESTIVE ENZYMES PO Take 0.5-1 tablets by mouth 3 (three) times daily before meals.    . docusate sodium (COLACE) 100 MG capsule Take 100 mg by mouth daily as needed for mild constipation.     Marland Kitchen doxycycline (VIBRA-TABS) 100 MG tablet Take 1 tablet (100 mg total) by mouth 2 (two) times daily. 14 tablet 0  . fexofenadine (ALLEGRA) 180 MG tablet Take 180 mg by mouth at bedtime.    . fluticasone (FLONASE) 50 MCG/ACT nasal spray Place 2 sprays into both nostrils daily.    . fluticasone (FLOVENT HFA) 44 MCG/ACT inhaler Inhale 2 puffs into the lungs 2 (two) times daily. 1 each 2  . folic acid (FOLVITE) 1 MG tablet Take 1 tablet (1 mg total) by mouth daily. 30 tablet 4  . guaiFENesin (MUCINEX) 600 MG 12 hr tablet Take 600 mg by mouth  2 (two) times daily.     Marland Kitchen HYDROcodone-acetaminophen (HYCET) 7.5-325 mg/15 ml solution Take 10 mLs by mouth 2 (two) times daily. For pain from cancer and radiation reactions 473 mL 0  . HYDROcodone-acetaminophen (NORCO/VICODIN) 5-325 MG tablet Take 1-2 tablets by mouth every 6 (six) hours as needed for moderate pain. 60 tablet 0  . lidocaine (XYLOCAINE) 2 % solution Use as directed 15 mLs in the mouth or throat as needed for mouth pain. Swallow 30 min prior to meals and at bedtime for throat/esophagus pain 250 mL 1  . lidocaine-prilocaine (EMLA) cream Apply 1 application topically as needed. (Patient taking differently: Apply 1 application topically as needed (port access). ) 30 g 2  . LORazepam (ATIVAN) 0.5 MG tablet Please  take 1 tablet 30 minutes before your scan as needed for anxiety 2 tablet 0  . LORazepam (ATIVAN) 2 MG tablet Take 1 tab 30 min before radiation treatment to the brain. 1 tablet 0  . Multiple Vitamin (MULTIVITAMIN WITH MINERALS) TABS tablet Take 1 tablet by mouth daily.    Marland Kitchen neomycin-bacitracin-polymyxin (NEOSPORIN) ointment Apply 1 application topically daily as needed for wound care.    . neomycin-polymyxin-hydrocortisone (CORTISPORIN) OTIC solution Apply 1-2 drops to toe after soaking BID (Patient taking differently: 1-2 drops See admin instructions. Apply 1-2 drops to toe after soaking twice daily) 10 mL 1  . omeprazole (PRILOSEC) 20 MG capsule Take 1 capsule (20 mg total) by mouth daily. Take as long as you are on dexamethasone to protect from stomach irritation. 30 capsule 2  . ondansetron (ZOFRAN ODT) 8 MG disintegrating tablet Take 1 tablet (8 mg total) by mouth every 8 (eight) hours as needed for nausea or vomiting. 30 tablet 3  . ondansetron (ZOFRAN) 4 MG tablet Take 1 tablet (4 mg total) by mouth every 8 (eight) hours as needed for nausea or vomiting. 20 tablet 0  . orphenadrine (NORFLEX) 100 MG tablet Take 1 tablet (100 mg total) by mouth 2 (two) times daily as needed for muscle spasms. 60 tablet 1  . OXYGEN Inhale 2.2 L/min into the lungs continuous.    . potassium chloride 20 MEQ/15ML (10%) SOLN Take 15 mLs (20 mEq total) by mouth 2 (two) times daily. (Patient taking differently: Take 20 mEq by mouth daily. ) 210 mL 0  . potassium chloride SA (KLOR-CON) 20 MEQ tablet Take 1 tablet (20 mEq total) by mouth 2 (two) times daily. 14 tablet 0  . prochlorperazine (COMPAZINE) 10 MG tablet Take 1 tablet (10 mg total) by mouth every 6 (six) hours as needed. (Patient taking differently: Take 10 mg by mouth every 6 (six) hours as needed for nausea or vomiting. ) 30 tablet 2  . sucralfate (CARAFATE) 1 g tablet Take 1 tablet (1 g total) by mouth 4 (four) times daily -  with meals and at bedtime. 90  tablet 2   No current facility-administered medications for this encounter.    Physical Findings: The patient is in no acute distress. Patient is alert and oriented.  vitals were not taken for this visit. .      Lab Findings: Lab Results  Component Value Date   WBC 8.4 02/22/2020   HGB 10.7 (L) 02/22/2020   HCT 33.1 (L) 02/22/2020   MCV 95.9 02/22/2020   PLT 242 02/22/2020    Radiographic Findings: MR Brain W Wo Contrast  Result Date: 02/20/2020 CLINICAL DATA:  59 year old female with non-small cell carcinoma. Approximately 20 small brain metastases identified on 3T brain  MRI in September, including 1 with evidence of leptomeningeal involvement. Status post SRS to 22 metastatic brain targets on 01/12/2020. Six week follow-up restaging.  Study performed under anesthesia. EXAM: MRI HEAD WITHOUT AND WITH CONTRAST TECHNIQUE: Multiplanar, multiecho pulse sequences of the brain and surrounding structures were obtained without and with intravenous contrast. CONTRAST:  76mL GADAVIST GADOBUTROL 1 MMOL/ML IV SOLN COMPARISON:  01/02/2020 and earlier. FINDINGS: BRAIN New Lesions: Questionable, 2-3 mm enhancing lesion located at the anteromedial frontal lobe with no edema or mass effect seen on series 1150, image 141. This is also visible on postcontrast T1 coronal series 9, image 42. Larger lesions: None. Stable or Smaller lesions: 5-6 mm enhancing lesion located in the right parietal lobe on series 1150, image 185, the lesion with associated leptomeningeal enhancement on prior studies which has also regressed (series 9 image 11, and series 1150 tracking from images 144-185). 5-6 mm enhancing lesion located in the right superior frontal gyrus on series 1150, image 226. Nearby 4 mm enhancing lesion located at the ventral surface of the right motor strip on image 209. 5 mm enhancing lesion located at the anterior right temporal lobe on image 81. Nearby punctate enhancing lesion located at the posterior  right temporal lobe on image 85. Contralateral left hemisphere anterior superior frontal gyrus lesions measuring 9 mm on series 1150, image 221, And nearby 3 mm lesion on image 217. Faint 2-3 mm enhancing lesion located in the medial left frontal lobe on image 204. Three mm enhancing lesion located at the anterior left frontal lobe on image 172. Punctate enhancing lesion located in the lateral left frontal lobe on image 186. 2-3 mm enhancing lesion located at the medial left parietal lobe on image 171. Punctate enhancing lesion located at the anterior left parietal lobe, possibly along the posterior margin of the sensory strip on image 212. Note that it is unclear whether this lesion was delineated preoperatively. Other Brain findings: Mild hemosiderin associated with most of the larger treated lesions. No significant residual cerebral edema. No new dural or leptomeningeal enhancement. No restricted diffusion to suggest acute infarction. No midline shift, mass effect, ventriculomegaly, extra-axial collection or acute intracranial hemorrhage. Cervicomedullary junction and pituitary are within normal limits. Vascular: Major intracranial vascular flow voids are stable. Skull and upper cervical spine: Stable and negative aside from some degenerative changes. Sinuses/Orbits: Stable, negative. Other: Intubated. Mastoids remain clear. Visible internal auditory structures appear normal. Scalp and face appear negative. IMPRESSION: 1. Overall satisfactory initial post treatment appearance of the brain. A total of eleven treated lesions remain visible and are all stable or smaller. And the leptomeningeal enhancement associated with the right parietal lesion has also regressed. It is unclear whether one additional punctate lesion in the left parietal lobe (series 1150, image 212) was treated. 2. Possibly one new punctate enhancing metastasis along the medial anterior left frontal lobe on series 1150, image 141. Recommend  attention to this lesion on the next follow-up. 3. No cerebral edema or intracranial mass effect. Electronically Signed   By: Genevie Ann M.D.   On: 02/20/2020 16:04   IR IMAGING GUIDED PORT INSERTION  Result Date: 02/16/2020 INDICATION: 59 year old female with metastatic non-small cell lung cancer. She presents for port catheter placement. EXAM: IMPLANTED PORT A CATH PLACEMENT WITH ULTRASOUND AND FLUOROSCOPIC GUIDANCE MEDICATIONS: 900 mg clindamycin; The antibiotic was administered within an appropriate time interval prior to skin puncture. ANESTHESIA/SEDATION: Versed 2 mg IV; Fentanyl 50 mcg IV; Moderate Sedation Time:  20 minutes The patient was  continuously monitored during the procedure by the interventional radiology nurse under my direct supervision. FLUOROSCOPY TIME:  0 minutes, 18 seconds (3 mGy) COMPLICATIONS: None immediate. PROCEDURE: The right neck and chest was prepped with chlorhexidine, and draped in the usual sterile fashion using maximum barrier technique (cap and mask, sterile gown, sterile gloves, large sterile sheet, hand hygiene and cutaneous antiseptic). Local anesthesia was attained by infiltration with 1% lidocaine with epinephrine. Ultrasound demonstrated patency of the right internal jugular vein, and this was documented with an image. Under real-time ultrasound guidance, this vein was accessed with a 21 gauge micropuncture needle and image documentation was performed. A small dermatotomy was made at the access site with an 11 scalpel. A 0.018" wire was advanced into the SVC and the access needle exchanged for a 53F micropuncture vascular sheath. The 0.018" wire was then removed and a 0.035" wire advanced into the IVC. An appropriate location for the subcutaneous reservoir was selected below the clavicle and an incision was made through the skin and underlying soft tissues. The subcutaneous tissues were then dissected using a combination of blunt and sharp surgical technique and a pocket  was formed. A single lumen power injectable portacatheter was then tunneled through the subcutaneous tissues from the pocket to the dermatotomy and the port reservoir placed within the subcutaneous pocket. The venous access site was then serially dilated and a peel away vascular sheath placed over the wire. The wire was removed and the port catheter advanced into position under fluoroscopic guidance. The catheter tip is positioned in the superior cavoatrial junction. This was documented with a spot image. The portacatheter was then tested and found to flush and aspirate well. The port was flushed with saline followed by 100 units/mL heparinized saline. The pocket was then closed in two layers using first subdermal inverted interrupted absorbable sutures followed by a running subcuticular suture. The epidermis was then sealed with Dermabond. The dermatotomy at the venous access site was also closed with Dermabond. IMPRESSION: Successful placement of a right IJ approach Power Port with ultrasound and fluoroscopic guidance. The catheter is ready for use. Electronically Signed   By: Jacqulynn Cadet M.D.   On: 02/16/2020 16:47    Impression/Plan: She reports doing well symptomatically from a neurologic standpoint.  She has some respiratory symptoms for which she is taking antibiotics and is waiting for Covid results.  I have reviewed her MRI images and her report.  In my assessment, she has had an excellent response to radiation therapy and all of the lesions on her current MRI have been treated.  I targeted 22 lesions when she was treated with SRS and when we fused her past contours to her current imaging it appears that all of these lesions have been accounted for.  I am pleased to see that her leptomeningeal disease has not progressed despite our targeted approach (she has still not undergone whole brain radiation therapy).  I let the patient know that we will call her after tumor board conference and let her  know when her next set of imaging will be, likely we will pursue this in 2 to 3 months.  Dexamethasone taper: Starting on 02/24/20, take 1/2 tablet daily.  Starting on 11/27, take 1/2 tablet every other day. Last dose: 12/11.  On date of service, in total, I spent 30 minutes on this encounter.  This encounter was provided by telemedicine platform telephone due to the patient's symptoms and lack of Covid test results in effort to reduce risk  of potential Covid transmission. The patient has given verbal consent for this type of encounter and has been advised to only accept a meeting of this type in a secure network environment. The attendants for this meeting include Eppie Gibson  and Beatrice Lecher.  During the encounter, Eppie Gibson was located at [facility] Radiation Oncology Department.  Leonilda A Zaun was located at home.  _____________________________________   Eppie Gibson, MD

## 2020-02-23 NOTE — Telephone Encounter (Signed)
After Elizabeth Mayer's follow-up call with Dr. Isidore Moos, she called back to ask if she is supposed to continue taking the steroid, 4mg  daily. Dr. Pearlie Oyster instructions are to begin tapering tomorrow, 11/20, start taking 1/2 tab (2mg ) every day; Starting 11/27 take 1/2 tab (2mg ) every other day with last dose being 12/11 (so she should be doing the 1/2 tab every other day for 2 weeks before stopping). I left these instructions on Elizabeth Mayer's voicemail along with my contact info to call back if she has any questions or if her symptoms start getting worse again (headaches, dizziness, etc) after beginning the taper.   Mont Dutton R.T.(R)(T) Radiation Special Procedures Navigator

## 2020-02-25 LAB — SARS-COV-2, NAA 2 DAY TAT

## 2020-02-25 LAB — NOVEL CORONAVIRUS, NAA: SARS-CoV-2, NAA: NOT DETECTED

## 2020-02-26 ENCOUNTER — Other Ambulatory Visit: Payer: Self-pay

## 2020-02-26 ENCOUNTER — Ambulatory Visit (INDEPENDENT_AMBULATORY_CARE_PROVIDER_SITE_OTHER): Payer: Managed Care, Other (non HMO) | Admitting: Emergency Medicine

## 2020-02-26 ENCOUNTER — Encounter: Payer: Self-pay | Admitting: Emergency Medicine

## 2020-02-26 ENCOUNTER — Inpatient Hospital Stay: Payer: Managed Care, Other (non HMO)

## 2020-02-26 DIAGNOSIS — J449 Chronic obstructive pulmonary disease, unspecified: Secondary | ICD-10-CM

## 2020-02-26 DIAGNOSIS — J9611 Chronic respiratory failure with hypoxia: Secondary | ICD-10-CM | POA: Insufficient documentation

## 2020-02-26 DIAGNOSIS — C3491 Malignant neoplasm of unspecified part of right bronchus or lung: Secondary | ICD-10-CM | POA: Diagnosis not present

## 2020-02-26 DIAGNOSIS — J209 Acute bronchitis, unspecified: Secondary | ICD-10-CM | POA: Diagnosis not present

## 2020-02-26 NOTE — Progress Notes (Signed)
Subjective:    Patient ID: Elizabeth Mayer, female    DOB: Dec 03, 1960, 59 y.o.   MRN: [id number]  HPI 59 year old former smoker (20 pack years), carries a history of childhood asthma. Mitral valve prolapse.  Possible CAD? Unclear evaluation, remote 40 yrs ago.  She has been evaluated by Dr. Wilburn Cornelia with ENT for left inferior neck adenopathy with associated dyspnea.  A CT scan of her neck was done 09/29/2019 which I have reviewed, shows multiple bilateral cervical nodes lymph nodes in the inferior aspect of the neck, extensive mediastinal adenopathy with areas of necrotic change. She has has exertional SOB since jan / feb. She noticed an increase in size of a 'Knot' left neck. She has now noticed a lump in her R axilla associated with some burning. No cough.   ROV 11/30/19 --follow-up visit for 59 year old former smoker with a history of childhood asthma.  I saw her in early July for new observation of cervical lymphadenopathy, extensive mediastinal adenopathy with necrotic change.  I sent her for biopsy of her cervical lymphadenopathy which was done on 11/01/2019 and showed metastatic carcinoma most consistent with lung adenocarcinoma.  PD-L1 expression 95%.  She is undergoing palliative radiotherapy to the cervical lesion and the lung mass which is encasing the right upper middle and lower lobe airways and causing postobstructive collapse.  Also on single agent immunotherapy.  She was experiencing progressive dyspnea earlier this week so a chest x-ray was performed 8/24.  I have reviewed, this shows no pleural effusion, stable right middle lobe and left lower lobe atelectasis. She continues to have dyspnea with exertion. She is coughing up clear mucous. She is unable to do her daily housework due to SOB.  She is going to undergo a sedation MRI brain on 12/05/19.   ROV 01/15/20 --59 year old woman, former smoker with childhood asthma seen for massive cervical and mediastinal lymphadenopathy.  This was  consistent with metastatic lung adenocarcinoma.  She has undergone local palliative radiotherapy to the neck and the lung, immunotherapy / chemo per Dr Isidore Moos and Dr Julien Nordmann.  MRI brain confirmed scattered metastases.  She has dyspnea, cough, both which respond to albuterol.  She was in the ED last week for escalated symptoms, was out of her albuterol at that time. Had chest tightness, SOB, wheeze. She is currently using the albuterol about every 6 hours.   ROV 02/26/20 --this follow-up visit for 59 year old woman with COPD/asthma. I initially met her to evaluate massive cervical and mediastinal lymphadenopathy. Lymph node biopsy showed metastatic lung adenocarcinoma, under treatment with chemoradiation. She had some increased shortness of breath, green mucus, chest tightness beginning about a week ago. I treated her with doxycycline but also asked her to be Covid tested which was negative 3 days ago. She reports that her cough has improved with the doxycycline.  She has O2 at night 2L/min. She is using it some to ambulate through the home as well.  She is using albuterol about 2-4x a day. She is on symbicort, flovent.    Review of Systems As per HPI     Objective:   Physical Exam  Today's Vitals   02/26/20 1129  BP: 128/62  Pulse: 86  Temp: (!) 97.1 F (36.2 C)  TempSrc: Temporal  SpO2: 96%  Weight: 144 lb 9.6 oz (65.6 kg)  Height: 5' 5"  (1.651 m)   Body mass index is 24.06 kg/m.;sm  Gen: Pleasant, well-nourished, in no distress,  normal affect  ENT: No lesions,  mouth clear,  oropharynx clear, no postnasal drip, soft voice, mild hoarseness  Neck: No JVD, no stridor, supraclav mass not palpable.   Lungs: No use of accessory muscles, no crackles or wheezing on normal respiration, no wheeze on forced expiration  Cardiovascular: RRR, heart sounds normal, no murmur or gallops, no peripheral edema  Musculoskeletal: No deformities, no cyanosis or clubbing  Neuro: alert, awake, non  focal  Skin: Warm, no lesions or rash     Assessment & Plan:  Acute bronchitis Recent acute bronchitis, following URI symptoms.  Covid negative.  She is benefiting from doxycycline.  She will finish the prescription as written  Asthma with COPD ([place]) Fairly good control.  She did not have wheezing with this most recent bronchitis.  She is on Flovent and Symbicort, redundant ICS.  She has problems with thrush and is not clear to me that she needs a Flovent.  We will stop it and continue the Symbicort, see how she does.  Keep albuterol available to use as needed  Non-small cell lung cancer, right [facility]) Following with Drs. Julien Nordmann and [place].  She received whole brain radiation, currently on surveillance.  She still getting chemotherapy.  Her next CT chest is 03/05/2020  Chronic respiratory failure with hypoxia (HCC) Documented nocturnal desaturations.  She has oxygen to use while sleeping 2 L/min.  She has been using it during the day as well and feels some benefit at home.  We will try to confirm exertional desaturations with a walking oximetry today.  If she does desaturate then we will try to get a portable oxygen concentrator for her, pulsed system.  Baltazar Apo, MD, PhD 02/26/2020, 11:45 AM [facility] [phone] or if no answer [phone]

## 2020-02-26 NOTE — Assessment & Plan Note (Signed)
Following with Drs. Julien Nordmann and [place].  She received whole brain radiation, currently on surveillance.  She still getting chemotherapy.  Her next CT chest is 03/05/2020

## 2020-02-26 NOTE — Addendum Note (Signed)
Addended by: Gavin Potters R on: 02/26/2020 02:50 PM   Modules accepted: Orders

## 2020-02-26 NOTE — Assessment & Plan Note (Signed)
Fairly good control.  She did not have wheezing with this most recent bronchitis.  She is on Flovent and Symbicort, redundant ICS.  She has problems with thrush and is not clear to me that she needs a Flovent.  We will stop it and continue the Symbicort, see how she does.  Keep albuterol available to use as needed

## 2020-02-26 NOTE — Assessment & Plan Note (Signed)
Recent acute bronchitis, following URI symptoms.  Covid negative.  She is benefiting from doxycycline.  She will finish the prescription as written

## 2020-02-26 NOTE — Telephone Encounter (Signed)
Pts COVID test was negative. Pt already has an appt with Dr. Lamonte Sakai today 02/26/20.    Will forward to Dr. Lamonte Sakai as Juluis Rainier.

## 2020-02-26 NOTE — Patient Instructions (Signed)
Try stopping your Flovent  Continue Symbicort 2 puffs twice a day.  Rinse and gargle after using. Keep your albuterol available to use 2 puffs up to every 4 hours when you need it for shortness of breath, chest tightness, wheezing. Finish taking your doxycycline until it is completely gone Get your CT scan 03/05/2020 as planned and follow with Drs. Julien Nordmann and [place]. Walking oximetry today Follow with Dr Lamonte Sakai in 4 months or sooner if you have any problems.

## 2020-02-26 NOTE — Assessment & Plan Note (Signed)
Documented nocturnal desaturations.  She has oxygen to use while sleeping 2 L/min.  She has been using it during the day as well and feels some benefit at home.  We will try to confirm exertional desaturations with a walking oximetry today.  If she does desaturate then we will try to get a portable oxygen concentrator for her, pulsed system.

## 2020-02-27 ENCOUNTER — Telehealth: Payer: Self-pay | Admitting: Emergency Medicine

## 2020-02-27 ENCOUNTER — Ambulatory Visit: Payer: Managed Care, Other (non HMO) | Admitting: Emergency Medicine

## 2020-02-27 DIAGNOSIS — C3491 Malignant neoplasm of unspecified part of right bronchus or lung: Secondary | ICD-10-CM

## 2020-02-27 DIAGNOSIS — J9611 Chronic respiratory failure with hypoxia: Secondary | ICD-10-CM

## 2020-02-27 NOTE — Telephone Encounter (Signed)
Order has been updated. 

## 2020-02-28 ENCOUNTER — Inpatient Hospital Stay: Payer: Managed Care, Other (non HMO)

## 2020-02-28 ENCOUNTER — Ambulatory Visit: Payer: Self-pay | Admitting: Radiation Oncology

## 2020-02-28 ENCOUNTER — Telehealth: Payer: Self-pay | Admitting: Emergency Medicine

## 2020-02-28 ENCOUNTER — Other Ambulatory Visit: Payer: Self-pay

## 2020-02-28 DIAGNOSIS — C3491 Malignant neoplasm of unspecified part of right bronchus or lung: Secondary | ICD-10-CM

## 2020-02-28 DIAGNOSIS — C3401 Malignant neoplasm of right main bronchus: Secondary | ICD-10-CM | POA: Diagnosis not present

## 2020-02-28 DIAGNOSIS — C7931 Secondary malignant neoplasm of brain: Secondary | ICD-10-CM

## 2020-02-28 DIAGNOSIS — Z95828 Presence of other vascular implants and grafts: Secondary | ICD-10-CM

## 2020-02-28 LAB — CMP (CANCER CENTER ONLY)
ALT: 33 U/L (ref 0–44)
AST: 24 U/L (ref 15–41)
Albumin: 3.3 g/dL — ABNORMAL LOW (ref 3.5–5.0)
Alkaline Phosphatase: 50 U/L (ref 38–126)
Anion gap: 8 (ref 5–15)
BUN: 18 mg/dL (ref 8–23)
CO2: 29 mmol/L (ref 22–32)
Calcium: 8.8 mg/dL — ABNORMAL LOW (ref 8.9–10.3)
Chloride: 104 mmol/L (ref 98–111)
Creatinine: 0.78 mg/dL (ref 0.44–1.00)
GFR, Estimated: >60 mL/min (ref >60)
Glucose, Bld: 79 mg/dL (ref 70–99)
Potassium: 3.6 mmol/L (ref 3.5–5.1)
Sodium: 141 mmol/L (ref 135–145)
Total Bilirubin: 0.3 mg/dL (ref 0.3–1.2)
Total Protein: 6.1 g/dL — ABNORMAL LOW (ref 6.5–8.1)

## 2020-02-28 LAB — CBC WITH DIFFERENTIAL (CANCER CENTER ONLY)
Abs Immature Granulocytes: 0.07 10*3/uL (ref 0.00–0.07)
Basophils Absolute: 0 10*3/uL (ref 0.0–0.1)
Basophils Relative: 1 %
Eosinophils Absolute: 0 10*3/uL (ref 0.0–0.5)
Eosinophils Relative: 1 %
HCT: 26.6 % — ABNORMAL LOW (ref 36.0–46.0)
Hemoglobin: 8.8 g/dL — ABNORMAL LOW (ref 12.0–15.0)
Immature Granulocytes: 2 %
Lymphocytes Relative: 18 %
Lymphs Abs: 0.7 10*3/uL (ref 0.7–4.0)
MCH: 31.2 pg (ref 26.0–34.0)
MCHC: 33.1 g/dL (ref 30.0–36.0)
MCV: 94.3 fL (ref 80.0–100.0)
Monocytes Absolute: 0.6 10*3/uL (ref 0.1–1.0)
Monocytes Relative: 16 %
Neutro Abs: 2.3 10*3/uL (ref 1.7–7.7)
Neutrophils Relative %: 62 %
Platelet Count: 158 10*3/uL (ref 150–400)
RBC: 2.82 MIL/uL — ABNORMAL LOW (ref 3.87–5.11)
RDW: 19.3 % — ABNORMAL HIGH (ref 11.5–15.5)
WBC Count: 3.7 10*3/uL — ABNORMAL LOW (ref 4.0–10.5)
nRBC: 0 % (ref 0.0–0.2)

## 2020-02-28 LAB — TSH: TSH: 0.967 u[IU]/mL (ref 0.308–3.960)

## 2020-02-28 MED ORDER — SODIUM CHLORIDE 0.9% FLUSH
10.0000 mL | Freq: Once | INTRAVENOUS | Status: AC
  Administered 2020-02-28: 10 mL
  Filled 2020-02-28: qty 10

## 2020-02-28 MED ORDER — HEPARIN SOD (PORK) LOCK FLUSH 100 UNIT/ML IV SOLN
500.0000 [IU] | Freq: Once | INTRAVENOUS | Status: AC
  Administered 2020-02-28: 500 [IU]
  Filled 2020-02-28: qty 5

## 2020-02-28 NOTE — Progress Notes (Signed)
  Patient Name: ILARIA MUCH MRN: [id number] DOB: 10/04/1960 Referring Physician: Minette Brine (Profile Not Attached) Date of Service: 01/12/2020 [facility], [place]                                                        End Of Treatment Note  Diagnoses: C79.31-Secondary malignant neoplasm of brain  Cancer Staging: STAGE IV  Intent: Palliative  Radiation Treatment Dates:  01/12/2020 Site Technique Total Dose (Gy) Dose per Fx (Gy) Completed Fx Beam Energies         Brain: Brain_SRS IMRT 20/20 20 1/1 6XFFF   Narrative: The patient tolerated radiation therapy relatively well.   Plan: The patient will follow-up with radiation oncology in 18mo .  -----------------------------------  Eppie Gibson, MD

## 2020-02-28 NOTE — Telephone Encounter (Signed)
I have called Elizabeth Mayer and left a VM to let her know that the order has been sent to [facility] for the [place].  Advised her to call back with any further questions.

## 2020-03-04 ENCOUNTER — Ambulatory Visit: Payer: Managed Care, Other (non HMO) | Admitting: Primary Care

## 2020-03-04 ENCOUNTER — Other Ambulatory Visit: Payer: Managed Care, Other (non HMO)

## 2020-03-05 ENCOUNTER — Other Ambulatory Visit: Payer: Managed Care, Other (non HMO)

## 2020-03-05 ENCOUNTER — Inpatient Hospital Stay: Admission: RE | Admit: 2020-03-05 | Payer: Managed Care, Other (non HMO) | Source: Ambulatory Visit

## 2020-03-07 ENCOUNTER — Ambulatory Visit: Payer: Managed Care, Other (non HMO)

## 2020-03-07 ENCOUNTER — Ambulatory Visit: Payer: Managed Care, Other (non HMO) | Admitting: Internal Medicine

## 2020-03-07 ENCOUNTER — Other Ambulatory Visit: Payer: Self-pay

## 2020-03-07 ENCOUNTER — Inpatient Hospital Stay: Payer: Managed Care, Other (non HMO) | Attending: Internal Medicine

## 2020-03-07 ENCOUNTER — Inpatient Hospital Stay: Payer: Managed Care, Other (non HMO)

## 2020-03-07 DIAGNOSIS — Z452 Encounter for adjustment and management of vascular access device: Secondary | ICD-10-CM | POA: Diagnosis not present

## 2020-03-07 DIAGNOSIS — Z5112 Encounter for antineoplastic immunotherapy: Secondary | ICD-10-CM | POA: Insufficient documentation

## 2020-03-07 DIAGNOSIS — C7931 Secondary malignant neoplasm of brain: Secondary | ICD-10-CM | POA: Diagnosis not present

## 2020-03-07 DIAGNOSIS — Z79899 Other long term (current) drug therapy: Secondary | ICD-10-CM | POA: Diagnosis not present

## 2020-03-07 DIAGNOSIS — R5383 Other fatigue: Secondary | ICD-10-CM | POA: Diagnosis not present

## 2020-03-07 DIAGNOSIS — Z87891 Personal history of nicotine dependence: Secondary | ICD-10-CM | POA: Insufficient documentation

## 2020-03-07 DIAGNOSIS — Z5111 Encounter for antineoplastic chemotherapy: Secondary | ICD-10-CM | POA: Insufficient documentation

## 2020-03-07 DIAGNOSIS — Z95828 Presence of other vascular implants and grafts: Secondary | ICD-10-CM

## 2020-03-07 DIAGNOSIS — C3491 Malignant neoplasm of unspecified part of right bronchus or lung: Secondary | ICD-10-CM | POA: Insufficient documentation

## 2020-03-07 DIAGNOSIS — Z9981 Dependence on supplemental oxygen: Secondary | ICD-10-CM | POA: Diagnosis not present

## 2020-03-07 LAB — CBC WITH DIFFERENTIAL (CANCER CENTER ONLY)
Abs Immature Granulocytes: 0.03 10*3/uL (ref 0.00–0.07)
Basophils Absolute: 0 10*3/uL (ref 0.0–0.1)
Basophils Relative: 0 %
Eosinophils Absolute: 0 10*3/uL (ref 0.0–0.5)
Eosinophils Relative: 1 %
HCT: 26.9 % — ABNORMAL LOW (ref 36.0–46.0)
Hemoglobin: 8.8 g/dL — ABNORMAL LOW (ref 12.0–15.0)
Immature Granulocytes: 1 %
Lymphocytes Relative: 14 %
Lymphs Abs: 0.5 10*3/uL — ABNORMAL LOW (ref 0.7–4.0)
MCH: 31.3 pg (ref 26.0–34.0)
MCHC: 32.7 g/dL (ref 30.0–36.0)
MCV: 95.7 fL (ref 80.0–100.0)
Monocytes Absolute: 0.5 10*3/uL (ref 0.1–1.0)
Monocytes Relative: 14 %
Neutro Abs: 2.4 10*3/uL (ref 1.7–7.7)
Neutrophils Relative %: 70 %
Platelet Count: 120 10*3/uL — ABNORMAL LOW (ref 150–400)
RBC: 2.81 MIL/uL — ABNORMAL LOW (ref 3.87–5.11)
RDW: 20 % — ABNORMAL HIGH (ref 11.5–15.5)
WBC Count: 3.4 10*3/uL — ABNORMAL LOW (ref 4.0–10.5)
nRBC: 0 % (ref 0.0–0.2)

## 2020-03-07 LAB — CMP (CANCER CENTER ONLY)
ALT: 44 U/L (ref 0–44)
AST: 39 U/L (ref 15–41)
Albumin: 3.2 g/dL — ABNORMAL LOW (ref 3.5–5.0)
Alkaline Phosphatase: 77 U/L (ref 38–126)
Anion gap: 7 (ref 5–15)
BUN: 10 mg/dL (ref 8–23)
CO2: 29 mmol/L (ref 22–32)
Calcium: 9.2 mg/dL (ref 8.9–10.3)
Chloride: 107 mmol/L (ref 98–111)
Creatinine: 0.82 mg/dL (ref 0.44–1.00)
GFR, Estimated: >60 mL/min (ref >60)
Glucose, Bld: 79 mg/dL (ref 70–99)
Potassium: 3.4 mmol/L — ABNORMAL LOW (ref 3.5–5.1)
Sodium: 143 mmol/L (ref 135–145)
Total Bilirubin: <0.2 mg/dL — ABNORMAL LOW (ref 0.3–1.2)
Total Protein: 6.5 g/dL (ref 6.5–8.1)

## 2020-03-07 MED ORDER — SODIUM CHLORIDE 0.9% FLUSH
10.0000 mL | Freq: Once | INTRAVENOUS | Status: AC
  Administered 2020-03-07: 10 mL
  Filled 2020-03-07: qty 10

## 2020-03-07 MED ORDER — HEPARIN SOD (PORK) LOCK FLUSH 100 UNIT/ML IV SOLN
500.0000 [IU] | Freq: Once | INTRAVENOUS | Status: DC
  Filled 2020-03-07: qty 5

## 2020-03-07 NOTE — Patient Instructions (Signed)

## 2020-03-12 ENCOUNTER — Telehealth: Payer: Self-pay

## 2020-03-12 ENCOUNTER — Inpatient Hospital Stay: Payer: Managed Care, Other (non HMO)

## 2020-03-12 ENCOUNTER — Inpatient Hospital Stay: Payer: Managed Care, Other (non HMO) | Admitting: Nutrition

## 2020-03-12 ENCOUNTER — Other Ambulatory Visit: Payer: Self-pay

## 2020-03-12 ENCOUNTER — Encounter: Payer: Self-pay | Admitting: Internal Medicine

## 2020-03-12 ENCOUNTER — Inpatient Hospital Stay (HOSPITAL_BASED_OUTPATIENT_CLINIC_OR_DEPARTMENT_OTHER): Payer: Managed Care, Other (non HMO) | Admitting: Internal Medicine

## 2020-03-12 VITALS — BP 158/80 | HR 94 | Temp 97.9°F | Resp 19 | Ht 65.0 in | Wt 153.9 lb

## 2020-03-12 DIAGNOSIS — Z5111 Encounter for antineoplastic chemotherapy: Secondary | ICD-10-CM | POA: Diagnosis not present

## 2020-03-12 DIAGNOSIS — C7931 Secondary malignant neoplasm of brain: Secondary | ICD-10-CM | POA: Diagnosis not present

## 2020-03-12 DIAGNOSIS — C3491 Malignant neoplasm of unspecified part of right bronchus or lung: Secondary | ICD-10-CM

## 2020-03-12 DIAGNOSIS — Z5112 Encounter for antineoplastic immunotherapy: Secondary | ICD-10-CM

## 2020-03-12 DIAGNOSIS — Z95828 Presence of other vascular implants and grafts: Secondary | ICD-10-CM

## 2020-03-12 LAB — CBC WITH DIFFERENTIAL (CANCER CENTER ONLY)
Abs Immature Granulocytes: 0.05 10*3/uL (ref 0.00–0.07)
Basophils Absolute: 0 10*3/uL (ref 0.0–0.1)
Basophils Relative: 0 %
Eosinophils Absolute: 0 10*3/uL (ref 0.0–0.5)
Eosinophils Relative: 1 %
HCT: 26.5 % — ABNORMAL LOW (ref 36.0–46.0)
Hemoglobin: 8.6 g/dL — ABNORMAL LOW (ref 12.0–15.0)
Immature Granulocytes: 1 %
Lymphocytes Relative: 11 %
Lymphs Abs: 0.5 10*3/uL — ABNORMAL LOW (ref 0.7–4.0)
MCH: 31.3 pg (ref 26.0–34.0)
MCHC: 32.5 g/dL (ref 30.0–36.0)
MCV: 96.4 fL (ref 80.0–100.0)
Monocytes Absolute: 0.8 10*3/uL (ref 0.1–1.0)
Monocytes Relative: 18 %
Neutro Abs: 3.2 10*3/uL (ref 1.7–7.7)
Neutrophils Relative %: 69 %
Platelet Count: 164 10*3/uL (ref 150–400)
RBC: 2.75 MIL/uL — ABNORMAL LOW (ref 3.87–5.11)
RDW: 19.1 % — ABNORMAL HIGH (ref 11.5–15.5)
WBC Count: 4.6 10*3/uL (ref 4.0–10.5)
nRBC: 0 % (ref 0.0–0.2)

## 2020-03-12 LAB — CMP (CANCER CENTER ONLY)
ALT: 23 U/L (ref 0–44)
AST: 24 U/L (ref 15–41)
Albumin: 3.1 g/dL — ABNORMAL LOW (ref 3.5–5.0)
Alkaline Phosphatase: 62 U/L (ref 38–126)
Anion gap: 8 (ref 5–15)
BUN: 9 mg/dL (ref 8–23)
CO2: 30 mmol/L (ref 22–32)
Calcium: 9 mg/dL (ref 8.9–10.3)
Chloride: 107 mmol/L (ref 98–111)
Creatinine: 0.8 mg/dL (ref 0.44–1.00)
GFR, Estimated: >60 mL/min (ref >60)
Glucose, Bld: 65 mg/dL — ABNORMAL LOW (ref 70–99)
Potassium: 3.3 mmol/L — ABNORMAL LOW (ref 3.5–5.1)
Sodium: 145 mmol/L (ref 135–145)
Total Bilirubin: <0.2 mg/dL — ABNORMAL LOW (ref 0.3–1.2)
Total Protein: 6.4 g/dL — ABNORMAL LOW (ref 6.5–8.1)

## 2020-03-12 MED ORDER — PROCHLORPERAZINE MALEATE 10 MG PO TABS
ORAL_TABLET | ORAL | Status: AC
  Filled 2020-03-12: qty 1

## 2020-03-12 MED ORDER — SODIUM CHLORIDE 0.9 % IV SOLN
500.0000 mg/m2 | Freq: Once | INTRAVENOUS | Status: AC
  Administered 2020-03-12: 800 mg via INTRAVENOUS
  Filled 2020-03-12: qty 20

## 2020-03-12 MED ORDER — SODIUM CHLORIDE 0.9% FLUSH
10.0000 mL | INTRAVENOUS | Status: DC | PRN
  Administered 2020-03-12: 10 mL
  Filled 2020-03-12: qty 10

## 2020-03-12 MED ORDER — HEPARIN SOD (PORK) LOCK FLUSH 100 UNIT/ML IV SOLN
500.0000 [IU] | Freq: Once | INTRAVENOUS | Status: AC | PRN
  Administered 2020-03-12: 500 [IU]
  Filled 2020-03-12: qty 5

## 2020-03-12 MED ORDER — SODIUM CHLORIDE 0.9 % IV SOLN
200.0000 mg | Freq: Once | INTRAVENOUS | Status: AC
  Administered 2020-03-12: 200 mg via INTRAVENOUS
  Filled 2020-03-12: qty 8

## 2020-03-12 MED ORDER — SODIUM CHLORIDE 0.9% FLUSH
10.0000 mL | Freq: Once | INTRAVENOUS | Status: AC
  Administered 2020-03-12: 10 mL
  Filled 2020-03-12: qty 10

## 2020-03-12 MED ORDER — SODIUM CHLORIDE 0.9 % IV SOLN
Freq: Once | INTRAVENOUS | Status: AC
  Filled 2020-03-12: qty 250

## 2020-03-12 MED ORDER — PROCHLORPERAZINE MALEATE 10 MG PO TABS
10.0000 mg | ORAL_TABLET | Freq: Once | ORAL | Status: AC
  Administered 2020-03-12: 10 mg via ORAL

## 2020-03-12 NOTE — Telephone Encounter (Signed)
Pt called stating her skin is cracking and splitting and wants to know what can be prescribed for this.  I discussed this with Dr. Julien Nordmann and he advised a thick OTC lotion or ointment should help her. He also indicated this is not caused by her chemo. Pt has been advised as indicated. Pt expressed understanding of this information.

## 2020-03-12 NOTE — Progress Notes (Signed)
Brief nutrition follow-up completed with patient during infusion for non-small cell lung cancer with brain metastases. Weight improved and was documented as 153.9 pounds on December 7.  This is increased from 132.1 pounds October 21. Patient reports she is eating okay. She reports her taste has really not improved and food continues to taste a bit bland. She denies nausea, vomiting, constipation, and diarrhea. She is a little concerned regarding the amount of weight she has gained and wonders if it is too much.  Patient weighed 157 pounds in April 2021.  Nutrition diagnosis: Unintended weight loss improved.  Intervention: I educated patient on the importance of consuming adequate calories and protein for weight maintenance and improved strength. Educated on healthy high-protein diet. Questions were answered.  Teach back method used.  Monitoring, evaluation, goals: Patient will tolerate adequate calories and protein for weight maintenance.  Next visit: To be scheduled as needed.  Patient has contact information for questions or concerns.  **Disclaimer: This note was dictated with voice recognition software. Similar sounding words can inadvertently be transcribed and this note may contain transcription errors which may not have been corrected upon publication of note.**

## 2020-03-12 NOTE — Progress Notes (Signed)
[facility] [phone]   [phone]  OFFICE PROGRESS NOTE  Minette Brine, [address] [address]  DIAGNOSIS: Stage IV non-small cell lung cancer, most consistent with adenocarcinoma.The patient presented with a right perihilar lung mass which encases and occludes the right lower lobe pulmonary artery. There is also associated encasement of the right upper lobe, right middle lobe, and right lower lobe bronchi. The polyp patient also has associated bilateral mediastinal, right axillary, cervical, and supraclavicular lymphadenopathy.She also has metastatic disease to the brain.The patient was diagnosed in July 2021  Biomarker Findings Tumor Mutational Burden - 16 Muts/Mb Microsatellite status - MS-Stable Genomic Findings For a complete list of the genes assayed, please refer to the Appendix. KRAS G12V PTEN loss exon 2 KDM6A deletion exons 6-21 TERT promoter -124C>T TP53 V274F 7 Disease relevant genes with no reportable alterations: ALK, BRAF, EGFR, ERBB2, MET, RET, ROS1  Tumor Proportion Score (TPS) (95%)  PRIOR THERAPY: 1)Palliative radiotherapy to the lung mass which is causing encasement of the right upper lobe, right middle lobe, and right lower lobe as well as palliative radiotherapy to the painful cervical adenopathy under the care of Dr. Sondra Come. Last dose of radiation on 12/04/2019 2) SRS to the brain metastases under the care of Dr. Isidore Moos. Treatment expected on 01/03/20 and 01/10/20.  CURRENT THERAPY: Palliative systemic chemotherapy with carboplatin for an AUC of 5, Alimta 500 mg per metered squared, Keytruda 200 mg IV every 3 weeks. First dose on 12/12/19.Status post4cycles.  INTERVAL HISTORY: Elizabeth Mayer 59 y.o. female returns to the clinic today for follow-up visit.  The patient is feeling fine today with no concerning complaints except for fatigue.  She has been tolerating her  treatment with systemic chemotherapy fairly well.  She was supposed to have repeat imaging studies after cycle #3 but for many logistic issues including insurance and patient availability this has been delayed and expected to be done on 03/22/2020.  She denied having any current nausea, vomiting, diarrhea or constipation.  She denied having any fever or chills. She is here today for evaluation before starting cycle #5 of her treatment  MEDICAL HISTORY: Past Medical History:  Diagnosis Date  . Anemia   . Angina 1982   related to stress  . Asthma    in the past, not now- never inhlaer use  . Complication of anesthesia    states anesthesia made her hair fall out  . Constipation   . Fatigue   . GERD (gastroesophageal reflux disease)   . Heart murmur 1970s  . Ingrown toenail   . Lung cancer ([facility]) 10/2019   metastatic disease to the brain  . On home oxygen therapy    2.2 lpm, 24 hours a day  . Past heart attack 1980-1981   pt states she passed out and woke up in hospital- told she had heart attack, but then dr said he couldn't find anything wrong.  . Pneumonia     ALLERGIES:  is allergic to pseudoephedrine, gabapentin, mobic [meloxicam], and penicillins.  MEDICATIONS:  Current Outpatient Medications  Medication Sig Dispense Refill  . acetaminophen (TYLENOL) 500 MG tablet Take 1,000 mg by mouth every 6 (six) hours as needed for moderate pain.     Marland Kitchen albuterol (VENTOLIN HFA) 108 (90 Base) MCG/ACT inhaler Inhale 2 puffs into the lungs every 6 (six) hours as needed for wheezing or shortness of breath. 18 g 6  . Artificial Saliva (BIOTENE DRY  MOUTH MOISTURIZING) SOLN Use as directed 1 Dose in the mouth or throat as needed (dry mouth).    . budesonide-formoterol (SYMBICORT) 80-4.5 MCG/ACT inhaler Inhale 2 puffs into the lungs in the morning and at bedtime. 1 each 11  . Carboxymethylcellul-Glycerin (LUBRICATING EYE DROPS OP) Place 1 drop into both eyes daily as needed (irritation).    . Cream  Base (WOUND CARE) CREA Apply 1 application topically as needed (wound care).    Marland Kitchen dexamethasone (DECADRON) 4 MG tablet Take 1 tablet TID through 10-16, then 1 tablet BID through 10-30, then 1 tablet daily until you see Dr. Isidore Moos to review MRI results. (Patient taking differently: Take 4 mg by mouth daily. ) 80 tablet 0  . DIGESTIVE ENZYMES PO Take 0.5-1 tablets by mouth 3 (three) times daily before meals.    . docusate sodium (COLACE) 100 MG capsule Take 100 mg by mouth daily as needed for mild constipation.     Marland Kitchen doxycycline (VIBRA-TABS) 100 MG tablet Take 1 tablet (100 mg total) by mouth 2 (two) times daily. 14 tablet 0  . fexofenadine (ALLEGRA) 180 MG tablet Take 180 mg by mouth at bedtime.    . fluticasone (FLONASE) 50 MCG/ACT nasal spray Place 2 sprays into both nostrils daily.    . fluticasone (FLOVENT HFA) 44 MCG/ACT inhaler Inhale 2 puffs into the lungs 2 (two) times daily. 1 each 2  . folic acid (FOLVITE) 1 MG tablet Take 1 tablet (1 mg total) by mouth daily. 30 tablet 4  . guaiFENesin (MUCINEX) 600 MG 12 hr tablet Take 600 mg by mouth 2 (two) times daily.     Marland Kitchen HYDROcodone-acetaminophen (HYCET) 7.5-325 mg/15 ml solution Take 10 mLs by mouth 2 (two) times daily. For pain from cancer and radiation reactions 473 mL 0  . HYDROcodone-acetaminophen (NORCO/VICODIN) 5-325 MG tablet Take 1-2 tablets by mouth every 6 (six) hours as needed for moderate pain. 60 tablet 0  . lidocaine (XYLOCAINE) 2 % solution Use as directed 15 mLs in the mouth or throat as needed for mouth pain. Swallow 30 min prior to meals and at bedtime for throat/esophagus pain 250 mL 1  . lidocaine-prilocaine (EMLA) cream Apply 1 application topically as needed. (Patient taking differently: Apply 1 application topically as needed (port access). ) 30 g 2  . LORazepam (ATIVAN) 0.5 MG tablet Please take 1 tablet 30 minutes before your scan as needed for anxiety 2 tablet 0  . LORazepam (ATIVAN) 2 MG tablet Take 1 tab 30 min before  radiation treatment to the brain. 1 tablet 0  . Multiple Vitamin (MULTIVITAMIN WITH MINERALS) TABS tablet Take 1 tablet by mouth daily.    Marland Kitchen neomycin-bacitracin-polymyxin (NEOSPORIN) ointment Apply 1 application topically daily as needed for wound care.    . neomycin-polymyxin-hydrocortisone (CORTISPORIN) OTIC solution Apply 1-2 drops to toe after soaking BID (Patient taking differently: 1-2 drops See admin instructions. Apply 1-2 drops to toe after soaking twice daily) 10 mL 1  . omeprazole (PRILOSEC) 20 MG capsule Take 1 capsule (20 mg total) by mouth daily. Take as long as you are on dexamethasone to protect from stomach irritation. 30 capsule 2  . ondansetron (ZOFRAN ODT) 8 MG disintegrating tablet Take 1 tablet (8 mg total) by mouth every 8 (eight) hours as needed for nausea or vomiting. 30 tablet 3  . ondansetron (ZOFRAN) 4 MG tablet Take 1 tablet (4 mg total) by mouth every 8 (eight) hours as needed for nausea or vomiting. 20 tablet 0  . orphenadrine (  NORFLEX) 100 MG tablet Take 1 tablet (100 mg total) by mouth 2 (two) times daily as needed for muscle spasms. 60 tablet 1  . OXYGEN Inhale 2.2 L/min into the lungs continuous.    . potassium chloride 20 MEQ/15ML (10%) SOLN Take 15 mLs (20 mEq total) by mouth 2 (two) times daily. (Patient taking differently: Take 20 mEq by mouth daily. ) 210 mL 0  . potassium chloride SA (KLOR-CON) 20 MEQ tablet Take 1 tablet (20 mEq total) by mouth 2 (two) times daily. 14 tablet 0  . prochlorperazine (COMPAZINE) 10 MG tablet Take 1 tablet (10 mg total) by mouth every 6 (six) hours as needed. (Patient taking differently: Take 10 mg by mouth every 6 (six) hours as needed for nausea or vomiting. ) 30 tablet 2  . sucralfate (CARAFATE) 1 g tablet Take 1 tablet (1 g total) by mouth 4 (four) times daily -  with meals and at bedtime. 90 tablet 2   No current facility-administered medications for this visit.    SURGICAL HISTORY:  Past Surgical History:  Procedure  Laterality Date  . CERVICAL DISC SURGERY  2000   Disc removed from neck   . ingrown toe nail surgery Bilateral   . IR IMAGING GUIDED PORT INSERTION  02/16/2020  . RADIOLOGY WITH ANESTHESIA N/A 12/05/2019   Procedure: MRI BRAIN WITH AND WITHOUT CONTRAST;  Surgeon: Radiologist, Medication, MD;  Location: [facility];  Service: Radiology;  Laterality: N/A;  . RADIOLOGY WITH ANESTHESIA N/A 01/02/2020   Procedure: MRI BRAIN WITH AND WITHOUT CONTRAST;  Surgeon: Radiologist, Medication, MD;  Location: [facility];  Service: Radiology;  Laterality: N/A;  . RADIOLOGY WITH ANESTHESIA N/A 02/20/2020   Procedure: MRI WITH ANESTHESIA OF BRAIN WITH AND WITHOUT CONTRAST;  Surgeon: Radiologist, Medication, MD;  Location: [facility];  Service: Radiology;  Laterality: N/A;    REVIEW OF SYSTEMS:  A comprehensive review of systems was negative except for: Constitutional: positive for fatigue   PHYSICAL EXAMINATION: General appearance: alert, cooperative, fatigued and no distress Head: Normocephalic, without obvious abnormality, atraumatic Neck: no adenopathy, no JVD, supple, symmetrical, trachea midline and thyroid not enlarged, symmetric, no tenderness/mass/nodules Lymph nodes: Cervical, supraclavicular, and axillary nodes normal. Resp: clear to auscultation bilaterally Back: symmetric, no curvature. ROM normal. No CVA tenderness. Cardio: regular rate and rhythm, S1, S2 normal, no murmur, click, rub or gallop GI: soft, non-tender; bowel sounds normal; no masses,  no organomegaly Extremities: extremities normal, atraumatic, no cyanosis or edema  ECOG PERFORMANCE STATUS: 1 - Symptomatic but completely ambulatory  Blood pressure (!) 158/80, pulse 94, temperature 97.9 F (36.6 C), resp. rate 19, height 5' 5"  (1.651 m), weight 153 lb 14.4 oz (69.8 kg), SpO2 100 %.  LABORATORY DATA: Lab Results  Component Value Date   WBC 4.6 03/12/2020   HGB 8.6 (L) 03/12/2020   HCT 26.5 (L) 03/12/2020   MCV 96.4 03/12/2020   PLT 164  03/12/2020      Chemistry      Component Value Date/Time   NA 143 03/07/2020 1235   NA 139 07/31/2019 1518   K 3.4 (L) 03/07/2020 1235   CL 107 03/07/2020 1235   CO2 29 03/07/2020 1235   BUN 10 03/07/2020 1235   BUN 8 07/31/2019 1518   CREATININE 0.82 03/07/2020 1235      Component Value Date/Time   CALCIUM 9.2 03/07/2020 1235   ALKPHOS 77 03/07/2020 1235   AST 39 03/07/2020 1235   ALT 44 03/07/2020 1235   BILITOT <0.2 (  L) 03/07/2020 1235       RADIOGRAPHIC STUDIES: MR Brain W Wo Contrast  Addendum Date: 02/26/2020   ADDENDUM REPORT: 02/26/2020 08:39 ADDENDUM: Study discussed at Brain Tumor Conference this morning. Dr. Judson Roch SQUIRE and her team did review their radiation oncology treatment planning scan and determine that both the lesion on series 1150, image 141 and the lesion on image 212 were in fact treated. CONCLUSION: Regression of disease following treatment. A total of 13 small brain metastases (and small area of leptomeningeal enhancement associated with a right parietal lesion) remain visible and are all stable to decreased. Electronically Signed   By: Genevie Ann M.D.   On: 02/26/2020 08:39   Result Date: 02/26/2020 CLINICAL DATA:  59 year old female with non-small cell carcinoma. Approximately 20 small brain metastases identified on 3T brain MRI in September, including 1 with evidence of leptomeningeal involvement. Status post SRS to 22 metastatic brain targets on 01/12/2020. Six week follow-up restaging.  Study performed under anesthesia. EXAM: MRI HEAD WITHOUT AND WITH CONTRAST TECHNIQUE: Multiplanar, multiecho pulse sequences of the brain and surrounding structures were obtained without and with intravenous contrast. CONTRAST:  57m GADAVIST GADOBUTROL 1 MMOL/ML IV SOLN COMPARISON:  01/02/2020 and earlier. FINDINGS: BRAIN New Lesions: Questionable, 2-3 mm enhancing lesion located at the anteromedial frontal lobe with no edema or mass effect seen on series 1150, image 141.  This is also visible on postcontrast T1 coronal series 9, image 42. Larger lesions: None. Stable or Smaller lesions: 5-6 mm enhancing lesion located in the right parietal lobe on series 1150, image 185, the lesion with associated leptomeningeal enhancement on prior studies which has also regressed (series 9 image 11, and series 1150 tracking from images 144-185). 5-6 mm enhancing lesion located in the right superior frontal gyrus on series 1150, image 226. Nearby 4 mm enhancing lesion located at the ventral surface of the right motor strip on image 209. 5 mm enhancing lesion located at the anterior right temporal lobe on image 81. Nearby punctate enhancing lesion located at the posterior right temporal lobe on image 85. Contralateral left hemisphere anterior superior frontal gyrus lesions measuring 9 mm on series 1150, image 221, And nearby 3 mm lesion on image 217. Faint 2-3 mm enhancing lesion located in the medial left frontal lobe on image 204. Three mm enhancing lesion located at the anterior left frontal lobe on image 172. Punctate enhancing lesion located in the lateral left frontal lobe on image 186. 2-3 mm enhancing lesion located at the medial left parietal lobe on image 171. Punctate enhancing lesion located at the anterior left parietal lobe, possibly along the posterior margin of the sensory strip on image 212. Note that it is unclear whether this lesion was delineated preoperatively. Other Brain findings: Mild hemosiderin associated with most of the larger treated lesions. No significant residual cerebral edema. No new dural or leptomeningeal enhancement. No restricted diffusion to suggest acute infarction. No midline shift, mass effect, ventriculomegaly, extra-axial collection or acute intracranial hemorrhage. Cervicomedullary junction and pituitary are within normal limits. Vascular: Major intracranial vascular flow voids are stable. Skull and upper cervical spine: Stable and negative aside from some  degenerative changes. Sinuses/Orbits: Stable, negative. Other: Intubated. Mastoids remain clear. Visible internal auditory structures appear normal. Scalp and face appear negative. IMPRESSION: 1. Overall satisfactory initial post treatment appearance of the brain. A total of eleven treated lesions remain visible and are all stable or smaller. And the leptomeningeal enhancement associated with the right parietal lesion has also regressed. It  is unclear whether one additional punctate lesion in the left parietal lobe (series 1150, image 212) was treated. 2. Possibly one new punctate enhancing metastasis along the medial anterior left frontal lobe on series 1150, image 141. Recommend attention to this lesion on the next follow-up. 3. No cerebral edema or intracranial mass effect. Electronically Signed: By: Genevie Ann M.D. On: 02/20/2020 16:04   IR IMAGING GUIDED PORT INSERTION  Result Date: 02/16/2020 INDICATION: 59 year old female with metastatic non-small cell lung cancer. She presents for port catheter placement. EXAM: IMPLANTED PORT A CATH PLACEMENT WITH ULTRASOUND AND FLUOROSCOPIC GUIDANCE MEDICATIONS: 900 mg clindamycin; The antibiotic was administered within an appropriate time interval prior to skin puncture. ANESTHESIA/SEDATION: Versed 2 mg IV; Fentanyl 50 mcg IV; Moderate Sedation Time:  20 minutes The patient was continuously monitored during the procedure by the interventional radiology nurse under my direct supervision. FLUOROSCOPY TIME:  0 minutes, 18 seconds (3 mGy) COMPLICATIONS: None immediate. PROCEDURE: The right neck and chest was prepped with chlorhexidine, and draped in the usual sterile fashion using maximum barrier technique (cap and mask, sterile gown, sterile gloves, large sterile sheet, hand hygiene and cutaneous antiseptic). Local anesthesia was attained by infiltration with 1% lidocaine with epinephrine. Ultrasound demonstrated patency of the right internal jugular vein, and this was  documented with an image. Under real-time ultrasound guidance, this vein was accessed with a 21 gauge micropuncture needle and image documentation was performed. A small dermatotomy was made at the access site with an 11 scalpel. A 0.018" wire was advanced into the SVC and the access needle exchanged for a 42F micropuncture vascular sheath. The 0.018" wire was then removed and a 0.035" wire advanced into the IVC. An appropriate location for the subcutaneous reservoir was selected below the clavicle and an incision was made through the skin and underlying soft tissues. The subcutaneous tissues were then dissected using a combination of blunt and sharp surgical technique and a pocket was formed. A single lumen power injectable portacatheter was then tunneled through the subcutaneous tissues from the pocket to the dermatotomy and the port reservoir placed within the subcutaneous pocket. The venous access site was then serially dilated and a peel away vascular sheath placed over the wire. The wire was removed and the port catheter advanced into position under fluoroscopic guidance. The catheter tip is positioned in the superior cavoatrial junction. This was documented with a spot image. The portacatheter was then tested and found to flush and aspirate well. The port was flushed with saline followed by 100 units/mL heparinized saline. The pocket was then closed in two layers using first subdermal inverted interrupted absorbable sutures followed by a running subcuticular suture. The epidermis was then sealed with Dermabond. The dermatotomy at the venous access site was also closed with Dermabond. IMPRESSION: Successful placement of a right IJ approach Power Port with ultrasound and fluoroscopic guidance. The catheter is ready for use. Electronically Signed   By: Jacqulynn Cadet M.D.   On: 02/16/2020 16:47    ASSESSMENT AND PLAN: This is a very pleasant 59 years old African-American female diagnosed with a stage IV  non-small cell lung cancer, adenocarcinoma presented with a right perihilar mass encasing and occluding the right lower lobe pulmonary artery as well as associated encasement of the right upper lobe, right middle lobe and right lower lobe bronchi with bilateral mediastinal, right axillary, cervical and supraclavicular lymphadenopathy as well as metastatic brain disease in July 2021. The molecular study showed no actionable mutation but the patient had  PD-L1 expression of 95% she is status post palliative radiotherapy to the right lung mass in addition to brain metastasis. The patient is currently undergoing systemic chemotherapy with carboplatin, Alimta and Keytruda status post 4 cycles. She has been tolerating this treatment well except for fatigue. I recommended for the patient to proceed with the first dose of maintenance treatment with Alimta and Keytruda today. She was supposed to have repeat imaging studies after cycle #3 but this has been delayed for several reasons. She is scheduled to have her scan done on March 22, 2020. I strongly encouraged the patient to keep her appointment for the scan otherwise I will not be able to give her any future treatment without knowing her response. She promised to keep her appointment. She will come back for follow-up visit in 3 weeks for evaluation before the next cycle of her treatment. She was advised to call immediately if she has any concerning symptoms in the interval. The patient voices understanding of current disease status and treatment options and is in agreement with the current care plan.  All questions were answered. The patient knows to call the clinic with any problems, questions or concerns. We can certainly see the patient much sooner if necessary.  Disclaimer: This note was dictated with voice recognition software. Similar sounding words can inadvertently be transcribed and may not be corrected upon review.

## 2020-03-12 NOTE — Patient Instructions (Signed)

## 2020-03-12 NOTE — Patient Instructions (Signed)
[facility] Discharge Instructions for Patients Receiving Chemotherapy  Today you received the following immunotherapy agent: Pembrolizumab (Keytruda) and chemotherapy agent: Pemetrexed (Alimta).  To help prevent nausea and vomiting after your treatment, we encourage you to take your nausea medication as directed by your MD.   If you develop nausea and vomiting that is not controlled by your nausea medication, call the clinic.   BELOW ARE SYMPTOMS THAT SHOULD BE REPORTED IMMEDIATELY:  *FEVER GREATER THAN 100.5 F  *CHILLS WITH OR WITHOUT FEVER  NAUSEA AND VOMITING THAT IS NOT CONTROLLED WITH YOUR NAUSEA MEDICATION  *UNUSUAL SHORTNESS OF BREATH  *UNUSUAL BRUISING OR BLEEDING  TENDERNESS IN MOUTH AND THROAT WITH OR WITHOUT PRESENCE OF ULCERS  *URINARY PROBLEMS  *BOWEL PROBLEMS  UNUSUAL RASH Items with * indicate a potential emergency and should be followed up as soon as possible.  Feel free to call the clinic should you have any questions or concerns. The clinic phone number is [phone].  Please show the [facility] at check-in to the Emergency Department and triage nurse.

## 2020-03-12 NOTE — Progress Notes (Signed)
Pt. stable for discharge. Left via wheelchair, no respiratory distress noted. Home oxygen at 3 liters/[place].

## 2020-03-14 ENCOUNTER — Other Ambulatory Visit: Payer: Self-pay

## 2020-03-14 ENCOUNTER — Inpatient Hospital Stay (HOSPITAL_BASED_OUTPATIENT_CLINIC_OR_DEPARTMENT_OTHER): Payer: Managed Care, Other (non HMO) | Admitting: Internal Medicine

## 2020-03-14 VITALS — BP 136/69 | HR 101 | Temp 99.4°F | Resp 17 | Ht 65.0 in | Wt 154.1 lb

## 2020-03-14 DIAGNOSIS — C7931 Secondary malignant neoplasm of brain: Secondary | ICD-10-CM

## 2020-03-14 DIAGNOSIS — C3491 Malignant neoplasm of unspecified part of right bronchus or lung: Secondary | ICD-10-CM | POA: Diagnosis not present

## 2020-03-15 ENCOUNTER — Telehealth: Payer: Self-pay

## 2020-03-15 NOTE — Telephone Encounter (Signed)
Pt LM stating she has a fever and chills.  Discussed with Cassie, PA-C who advised for pt to go to the ER.  I called the pt back and directed her to go to the ER and she stated she took an Oxycodone and feels better. I reiterated to the pt that a pain pill will not help if she has an infection. Pt stated she felt fine.

## 2020-03-15 NOTE — Progress Notes (Signed)
[facility] [place] [address] [place], [place] [address] [phone]   New Patient Evaluation  Date of Service: 03/15/20 Patient Name: Elizabeth Mayer Patient MRN: [id number] Patient DOB: 1961/02/01 Provider: Ventura Sellers, MD  Identifying Statement:  Elizabeth Mayer is a 59 y.o. female with Brain metastases ([place]) [C79.31] who presents for initial consultation and evaluation regarding cancer associated neurologic deficits.    Referring Provider: Eppie Gibson, MD [place],   [id number]  Primary Cancer:  Oncologic History: Oncology History  Non-small cell lung cancer, right ([place])  11/08/2019 Initial Diagnosis   Non-small cell lung cancer, right ([place])   12/14/2019 - 12/14/2019 Chemotherapy   The patient had cemiplimab-rwlc (LIBTAYO) 350 mg in sodium chloride 0.9 % 100 mL chemo infusion, 350 mg, Intravenous, Once, 0 of 6 cycles  for chemotherapy treatment.    12/14/2019 -  Chemotherapy   The patient had palonosetron (ALOXI) injection 0.25 mg, 0.25 mg, Intravenous,  Once, 4 of 4 cycles Administration: 0.25 mg (12/14/2019), 0.25 mg (01/25/2020), 0.25 mg (02/19/2020), 0.25 mg (01/05/2020) PEMEtrexed (ALIMTA) 800 mg in sodium chloride 0.9 % 100 mL chemo infusion, 500 mg/m2 = 800 mg, Intravenous,  Once, 5 of 10 cycles Administration: 800 mg (12/14/2019), 800 mg (01/25/2020), 800 mg (02/19/2020), 800 mg (03/12/2020), 800 mg (01/05/2020) CARBOplatin (PARAPLATIN) 480 mg in sodium chloride 0.9 % 250 mL chemo infusion, 480 mg (100 % of original dose 484.5 mg), Intravenous,  Once, 4 of 4 cycles Dose modification: 484.5 mg (original dose 484.5 mg, Cycle 1), 484.5 mg (original dose 484.5 mg, Cycle 3), 467.5 mg (original dose 467.5 mg, Cycle 4), 484.5 mg (original dose 484.5 mg, Cycle 2) Administration: 480 mg (12/14/2019), 480 mg (01/25/2020), 470 mg (02/19/2020), 480 mg (01/05/2020) fosaprepitant (EMEND) 150 mg in sodium chloride 0.9 % 145 mL IVPB, 150  mg, Intravenous,  Once, 4 of 4 cycles Administration: 150 mg (12/14/2019), 150 mg (01/25/2020), 150 mg (02/19/2020), 150 mg (01/05/2020) pembrolizumab (KEYTRUDA) 200 mg in sodium chloride 0.9 % 50 mL chemo infusion, 200 mg, Intravenous, Once, 5 of 10 cycles Administration: 200 mg (12/14/2019), 200 mg (01/25/2020), 200 mg (02/19/2020), 200 mg (03/12/2020), 200 mg (01/05/2020)  for chemotherapy treatment.     CNS Oncologic History 01/12/20: SRS to 22 targets Isidore Moos)  History of Present Illness: The patient's records from the referring physician were obtained and reviewed and the patient interviewed to confirm this HPI.  [name] presents today for follow up after recent MRI brain.  She describes no new or progressive neurologic deficits.  No seizures or headaches.  Still independent with gait and activities of daily living.  Continues on chemotherapy with Dr. Julien Nordmann.  Currently taking decadron 1mg  daily.  Medications: Current Outpatient Medications on File Prior to Visit  Medication Sig Dispense Refill  . acetaminophen (TYLENOL) 500 MG tablet Take 1,000 mg by mouth every 6 (six) hours as needed for moderate pain.     Marland Kitchen albuterol (VENTOLIN HFA) 108 (90 Base) MCG/ACT inhaler Inhale 2 puffs into the lungs every 6 (six) hours as needed for wheezing or shortness of breath. 18 g 6  . Artificial Saliva (BIOTENE DRY MOUTH MOISTURIZING) SOLN Use as directed 1 Dose in the mouth or throat as needed (dry mouth).    . budesonide-formoterol (SYMBICORT) 80-4.5 MCG/ACT inhaler Inhale 2 puffs into the lungs in the morning and at bedtime. 1 each 11  . Carboxymethylcellul-Glycerin (LUBRICATING EYE DROPS OP) Place 1 drop into both  eyes daily as needed (irritation).    . Cream Base (WOUND CARE) CREA Apply 1 application topically as needed (wound care).    Marland Kitchen dexamethasone (DECADRON) 4 MG tablet Take 1 tablet TID through 10-16, then 1 tablet BID through 10-30, then 1 tablet daily until you see Dr. Isidore Moos to review  MRI results. (Patient taking differently: Take 4 mg by mouth daily. ) 80 tablet 0  . DIGESTIVE ENZYMES PO Take 0.5-1 tablets by mouth 3 (three) times daily before meals.    . docusate sodium (COLACE) 100 MG capsule Take 100 mg by mouth daily as needed for mild constipation.     Marland Kitchen doxycycline (VIBRA-TABS) 100 MG tablet Take 1 tablet (100 mg total) by mouth 2 (two) times daily. 14 tablet 0  . fexofenadine (ALLEGRA) 180 MG tablet Take 180 mg by mouth at bedtime.    . fluticasone (FLONASE) 50 MCG/ACT nasal spray Place 2 sprays into both nostrils daily.    . fluticasone (FLOVENT HFA) 44 MCG/ACT inhaler Inhale 2 puffs into the lungs 2 (two) times daily. 1 each 2  . folic acid (FOLVITE) 1 MG tablet Take 1 tablet (1 mg total) by mouth daily. 30 tablet 4  . guaiFENesin (MUCINEX) 600 MG 12 hr tablet Take 600 mg by mouth 2 (two) times daily.     Marland Kitchen HYDROcodone-acetaminophen (HYCET) 7.5-325 mg/15 ml solution Take 10 mLs by mouth 2 (two) times daily. For pain from cancer and radiation reactions 473 mL 0  . HYDROcodone-acetaminophen (NORCO/VICODIN) 5-325 MG tablet Take 1-2 tablets by mouth every 6 (six) hours as needed for moderate pain. 60 tablet 0  . lidocaine (XYLOCAINE) 2 % solution Use as directed 15 mLs in the mouth or throat as needed for mouth pain. Swallow 30 min prior to meals and at bedtime for throat/esophagus pain 250 mL 1  . lidocaine-prilocaine (EMLA) cream Apply 1 application topically as needed. (Patient taking differently: Apply 1 application topically as needed (port access). ) 30 g 2  . LORazepam (ATIVAN) 0.5 MG tablet Please take 1 tablet 30 minutes before your scan as needed for anxiety 2 tablet 0  . LORazepam (ATIVAN) 2 MG tablet Take 1 tab 30 min before radiation treatment to the brain. 1 tablet 0  . Multiple Vitamin (MULTIVITAMIN WITH MINERALS) TABS tablet Take 1 tablet by mouth daily.    Marland Kitchen neomycin-bacitracin-polymyxin (NEOSPORIN) ointment Apply 1 application topically daily as needed for  wound care.    . neomycin-polymyxin-hydrocortisone (CORTISPORIN) OTIC solution Apply 1-2 drops to toe after soaking BID (Patient taking differently: 1-2 drops See admin instructions. Apply 1-2 drops to toe after soaking twice daily) 10 mL 1  . omeprazole (PRILOSEC) 20 MG capsule Take 1 capsule (20 mg total) by mouth daily. Take as long as you are on dexamethasone to protect from stomach irritation. 30 capsule 2  . ondansetron (ZOFRAN ODT) 8 MG disintegrating tablet Take 1 tablet (8 mg total) by mouth every 8 (eight) hours as needed for nausea or vomiting. 30 tablet 3  . ondansetron (ZOFRAN) 4 MG tablet Take 1 tablet (4 mg total) by mouth every 8 (eight) hours as needed for nausea or vomiting. 20 tablet 0  . orphenadrine (NORFLEX) 100 MG tablet Take 1 tablet (100 mg total) by mouth 2 (two) times daily as needed for muscle spasms. 60 tablet 1  . OXYGEN Inhale 2.2 L/min into the lungs continuous.    . potassium chloride 20 MEQ/15ML (10%) SOLN Take 15 mLs (20 mEq total) by mouth 2 (  two) times daily. (Patient taking differently: Take 20 mEq by mouth daily. ) 210 mL 0  . potassium chloride SA (KLOR-CON) 20 MEQ tablet Take 1 tablet (20 mEq total) by mouth 2 (two) times daily. 14 tablet 0  . prochlorperazine (COMPAZINE) 10 MG tablet Take 1 tablet (10 mg total) by mouth every 6 (six) hours as needed. (Patient taking differently: Take 10 mg by mouth every 6 (six) hours as needed for nausea or vomiting. ) 30 tablet 2  . sucralfate (CARAFATE) 1 g tablet Take 1 tablet (1 g total) by mouth 4 (four) times daily -  with meals and at bedtime. 90 tablet 2   No current facility-administered medications on file prior to visit.    Allergies:  Allergies  Allergen Reactions  . Pseudoephedrine Hypertension  . Gabapentin Other (See Comments)    Raise blood pressure and red rings around eyes Blood vessels popped in her eyes  . Mobic [Meloxicam] Swelling    Inflamed the area that has inflammation and stabbing pains in  the area  . Penicillins Hives   Past Medical History:  Past Medical History:  Diagnosis Date  . Anemia   . Angina 1982   related to stress  . Asthma    in the past, not now- never inhlaer use  . Complication of anesthesia    states anesthesia made her hair fall out  . Constipation   . Fatigue   . GERD (gastroesophageal reflux disease)   . Heart murmur 1970s  . Ingrown toenail   . Lung cancer ([place]) 10/2019   metastatic disease to the brain  . On home oxygen therapy    2.2 lpm, 24 hours a day  . Past heart attack 1980-1981   pt states she passed out and woke up in hospital- told she had heart attack, but then dr said he couldn't find anything wrong.  . Pneumonia    Past Surgical History:  Past Surgical History:  Procedure Laterality Date  . CERVICAL DISC SURGERY  2000   Disc removed from neck   . ingrown toe nail surgery Bilateral   . IR IMAGING GUIDED PORT INSERTION  02/16/2020  . RADIOLOGY WITH ANESTHESIA N/A 12/05/2019   Procedure: MRI BRAIN WITH AND WITHOUT CONTRAST;  Surgeon: Radiologist, Medication, MD;  Location: [facility];  Service: Radiology;  Laterality: N/A;  . RADIOLOGY WITH ANESTHESIA N/A 01/02/2020   Procedure: MRI BRAIN WITH AND WITHOUT CONTRAST;  Surgeon: Radiologist, Medication, MD;  Location: [facility];  Service: Radiology;  Laterality: N/A;  . RADIOLOGY WITH ANESTHESIA N/A 02/20/2020   Procedure: MRI WITH ANESTHESIA OF BRAIN WITH AND WITHOUT CONTRAST;  Surgeon: Radiologist, Medication, MD;  Location: [facility];  Service: Radiology;  Laterality: N/A;   Social History:  Social History   Socioeconomic History  . Marital status: Married    Spouse name: Not on file  . Number of children: 0  . Years of education: Not on file  . Highest education level: Not on file  Occupational History  . Occupation: Secondary school teacher  Tobacco Use  . Smoking status: Former Smoker    Packs/day: 1.00    Years: 20.00    Pack years: 20.00    Types: Cigarettes    Start date: 10     Quit date: 2001    Years since quitting: 20.9  . Smokeless tobacco: Never Used  Vaping Use  . Vaping Use: Never used  Substance and Sexual Activity  . Alcohol use: No  . Drug use:  No  . Sexual activity: Yes    Birth control/protection: None  Other Topics Concern  . Not on file  Social History Narrative  . Not on file   Social Determinants of Health   Financial Resource Strain: High Risk  . Difficulty of Paying Living Expenses: Hard  Food Insecurity: No Food Insecurity  . Worried About Charity fundraiser in the Last Year: Never true  . Ran Out of Food in the Last Year: Never true  Transportation Needs: No Transportation Needs  . Lack of Transportation (Medical): No  . Lack of Transportation (Non-Medical): No  Physical Activity: Inactive  . Days of Exercise per Week: 0 days  . Minutes of Exercise per Session: 0 min  Stress: Not on file  Social Connections: Moderately Isolated  . Frequency of Communication with Friends and Family: Twice a week  . Frequency of Social Gatherings with Friends and Family: Twice a week  . Attends Religious Services: More than 4 times per year  . Active Member of Clubs or Organizations: No  . Attends Archivist Meetings: Not on file  . Marital Status: Separated  Intimate Partner Violence: Not on file   Family History:  Family History  Problem Relation Age of Onset  . Arthritis Mother   . Heart failure Father     Review of Systems: Constitutional: Doesn't report fevers, chills or abnormal weight loss Eyes: Doesn't report blurriness of vision Ears, nose, mouth, throat, and face: Doesn't report sore throat Respiratory: Doesn't report cough, dyspnea or wheezes Cardiovascular: Doesn't report palpitation, chest discomfort  Gastrointestinal:  Doesn't report nausea, constipation, diarrhea GU: Doesn't report incontinence Skin: Doesn't report skin rashes Neurological: Per HPI Musculoskeletal: Doesn't report joint pain Behavioral/Psych:  Doesn't report anxiety  Physical Exam: Vitals:   03/14/20 1032  BP: 136/69  Pulse: (!) 101  Resp: 17  Temp: 99.4 F (37.4 C)  SpO2: 98%   KPS: 90. General: Alert, cooperative, pleasant, in no acute distress Head: Normal EENT: No conjunctival injection or scleral icterus.  Lungs: Resp effort normal Cardiac: Regular rate Abdomen: Non-distended abdomen Skin: No rashes cyanosis or petechiae. Extremities: No clubbing or edema  Neurologic Exam: Mental Status: Awake, alert, attentive to examiner. Oriented to self and environment. Language is fluent with intact comprehension.  Cranial Nerves: Visual acuity is grossly normal. Visual fields are full. Extra-ocular movements intact. No ptosis. Face is symmetric Motor: Tone and bulk are normal. Power is full in both arms and legs. Reflexes are symmetric, no pathologic reflexes present.  Sensory: Intact to light touch Gait: Normal.   Labs: I have reviewed the data as listed    Component Value Date/Time   NA 145 03/12/2020 1150   NA 139 07/31/2019 1518   K 3.3 (L) 03/12/2020 1150   CL 107 03/12/2020 1150   CO2 30 03/12/2020 1150   GLUCOSE 65 (L) 03/12/2020 1150   BUN 9 03/12/2020 1150   BUN 8 07/31/2019 1518   CREATININE 0.80 03/12/2020 1150   CALCIUM 9.0 03/12/2020 1150   PROT 6.4 (L) 03/12/2020 1150   PROT 6.4 01/19/2019 1129   ALBUMIN 3.1 (L) 03/12/2020 1150   ALBUMIN 4.0 01/19/2019 1129   AST 24 03/12/2020 1150   ALT 23 03/12/2020 1150   ALKPHOS 62 03/12/2020 1150   BILITOT <0.2 (L) 03/12/2020 1150   GFRNONAA >60 03/12/2020 1150   GFRAA >60 01/08/2020 1342   GFRAA >60 01/04/2020 0907   Lab Results  Component Value Date   WBC 4.6 03/12/2020  NEUTROABS 3.2 03/12/2020   HGB 8.6 (L) 03/12/2020   HCT 26.5 (L) 03/12/2020   MCV 96.4 03/12/2020   PLT 164 03/12/2020    Imaging:  MR Brain W Wo Contrast  Addendum Date: 02/26/2020   ADDENDUM REPORT: 02/26/2020 08:39 ADDENDUM: Study discussed at Brain Tumor Conference  this morning. Dr. Judson Roch SQUIRE and her team did review their radiation oncology treatment planning scan and determine that both the lesion on series 1150, image 141 and the lesion on image 212 were in fact treated. CONCLUSION: Regression of disease following treatment. A total of 13 small brain metastases (and small area of leptomeningeal enhancement associated with a right parietal lesion) remain visible and are all stable to decreased. Electronically Signed   By: Genevie Ann M.D.   On: 02/26/2020 08:39   Result Date: 02/26/2020 CLINICAL DATA:  59 year old female with non-small cell carcinoma. Approximately 20 small brain metastases identified on 3T brain MRI in September, including 1 with evidence of leptomeningeal involvement. Status post SRS to 22 metastatic brain targets on 01/12/2020. Six week follow-up restaging.  Study performed under anesthesia. EXAM: MRI HEAD WITHOUT AND WITH CONTRAST TECHNIQUE: Multiplanar, multiecho pulse sequences of the brain and surrounding structures were obtained without and with intravenous contrast. CONTRAST:  16mL GADAVIST GADOBUTROL 1 MMOL/ML IV SOLN COMPARISON:  01/02/2020 and earlier. FINDINGS: BRAIN New Lesions: Questionable, 2-3 mm enhancing lesion located at the anteromedial frontal lobe with no edema or mass effect seen on series 1150, image 141. This is also visible on postcontrast T1 coronal series 9, image 42. Larger lesions: None. Stable or Smaller lesions: 5-6 mm enhancing lesion located in the right parietal lobe on series 1150, image 185, the lesion with associated leptomeningeal enhancement on prior studies which has also regressed (series 9 image 11, and series 1150 tracking from images 144-185). 5-6 mm enhancing lesion located in the right superior frontal gyrus on series 1150, image 226. Nearby 4 mm enhancing lesion located at the ventral surface of the right motor strip on image 209. 5 mm enhancing lesion located at the anterior right temporal lobe on image 81.  Nearby punctate enhancing lesion located at the posterior right temporal lobe on image 85. Contralateral left hemisphere anterior superior frontal gyrus lesions measuring 9 mm on series 1150, image 221, And nearby 3 mm lesion on image 217. Faint 2-3 mm enhancing lesion located in the medial left frontal lobe on image 204. Three mm enhancing lesion located at the anterior left frontal lobe on image 172. Punctate enhancing lesion located in the lateral left frontal lobe on image 186. 2-3 mm enhancing lesion located at the medial left parietal lobe on image 171. Punctate enhancing lesion located at the anterior left parietal lobe, possibly along the posterior margin of the sensory strip on image 212. Note that it is unclear whether this lesion was delineated preoperatively. Other Brain findings: Mild hemosiderin associated with most of the larger treated lesions. No significant residual cerebral edema. No new dural or leptomeningeal enhancement. No restricted diffusion to suggest acute infarction. No midline shift, mass effect, ventriculomegaly, extra-axial collection or acute intracranial hemorrhage. Cervicomedullary junction and pituitary are within normal limits. Vascular: Major intracranial vascular flow voids are stable. Skull and upper cervical spine: Stable and negative aside from some degenerative changes. Sinuses/Orbits: Stable, negative. Other: Intubated. Mastoids remain clear. Visible internal auditory structures appear normal. Scalp and face appear negative. IMPRESSION: 1. Overall satisfactory initial post treatment appearance of the brain. A total of eleven treated lesions remain visible and  are all stable or smaller. And the leptomeningeal enhancement associated with the right parietal lesion has also regressed. It is unclear whether one additional punctate lesion in the left parietal lobe (series 1150, image 212) was treated. 2. Possibly one new punctate enhancing metastasis along the medial anterior  left frontal lobe on series 1150, image 141. Recommend attention to this lesion on the next follow-up. 3. No cerebral edema or intracranial mass effect. Electronically Signed: By: Genevie Ann M.D. On: 02/20/2020 16:04   IR IMAGING GUIDED PORT INSERTION  Result Date: 02/16/2020 INDICATION: 59 year old female with metastatic non-small cell lung cancer. She presents for port catheter placement. EXAM: IMPLANTED PORT A CATH PLACEMENT WITH ULTRASOUND AND FLUOROSCOPIC GUIDANCE MEDICATIONS: 900 mg clindamycin; The antibiotic was administered within an appropriate time interval prior to skin puncture. ANESTHESIA/SEDATION: Versed 2 mg IV; Fentanyl 50 mcg IV; Moderate Sedation Time:  20 minutes The patient was continuously monitored during the procedure by the interventional radiology nurse under my direct supervision. FLUOROSCOPY TIME:  0 minutes, 18 seconds (3 mGy) COMPLICATIONS: None immediate. PROCEDURE: The right neck and chest was prepped with chlorhexidine, and draped in the usual sterile fashion using maximum barrier technique (cap and mask, sterile gown, sterile gloves, large sterile sheet, hand hygiene and cutaneous antiseptic). Local anesthesia was attained by infiltration with 1% lidocaine with epinephrine. Ultrasound demonstrated patency of the right internal jugular vein, and this was documented with an image. Under real-time ultrasound guidance, this vein was accessed with a 21 gauge micropuncture needle and image documentation was performed. A small dermatotomy was made at the access site with an 11 scalpel. A 0.018" wire was advanced into the SVC and the access needle exchanged for a 25F micropuncture vascular sheath. The 0.018" wire was then removed and a 0.035" wire advanced into the IVC. An appropriate location for the subcutaneous reservoir was selected below the clavicle and an incision was made through the skin and underlying soft tissues. The subcutaneous tissues were then dissected using a combination  of blunt and sharp surgical technique and a pocket was formed. A single lumen power injectable portacatheter was then tunneled through the subcutaneous tissues from the pocket to the dermatotomy and the port reservoir placed within the subcutaneous pocket. The venous access site was then serially dilated and a peel away vascular sheath placed over the wire. The wire was removed and the port catheter advanced into position under fluoroscopic guidance. The catheter tip is positioned in the superior cavoatrial junction. This was documented with a spot image. The portacatheter was then tested and found to flush and aspirate well. The port was flushed with saline followed by 100 units/mL heparinized saline. The pocket was then closed in two layers using first subdermal inverted interrupted absorbable sutures followed by a running subcuticular suture. The epidermis was then sealed with Dermabond. The dermatotomy at the venous access site was also closed with Dermabond. IMPRESSION: Successful placement of a right IJ approach Power Port with ultrasound and fluoroscopic guidance. The catheter is ready for use. Electronically Signed   By: Jacqulynn Cadet M.D.   On: 02/16/2020 16:47    [place] Clinician Interpretation: I have personally reviewed the radiological images as listed.  My interpretation, in the context of the patient's clinical presentation, is stable disease   Assessment/Plan Brain metastases ([place]) [C79.31]  Tiandra A Bobe is clinically stable today.  Her brain MRI was reviewed in brain/spine tumor board, demonstrates overall stable disease.  Several foci of enhancement of questionable etiology appear to have  been within treatment field.  She may formally discontinue decadron at this time.   We spent twenty additional minutes teaching regarding the natural history, biology, and historical experience in the treatment of neurologic complications of cancer.   We appreciate the opportunity to  participate in the care of [place].   We ask that [employer] return to clinic in 3 months following next brain MRI, or sooner as needed.  All questions were answered. The patient knows to call the clinic with any problems, questions or concerns. No barriers to learning were detected.  The total time spent in the encounter was 40 minutes and more than 50% was on counseling and review of test results   Ventura Sellers, MD Medical Director of Neuro-Oncology [facility] [place] 03/15/20 10:11 AM

## 2020-03-16 ENCOUNTER — Other Ambulatory Visit: Payer: Self-pay

## 2020-03-16 ENCOUNTER — Encounter (HOSPITAL_COMMUNITY): Payer: Self-pay

## 2020-03-16 ENCOUNTER — Emergency Department (HOSPITAL_COMMUNITY): Payer: Managed Care, Other (non HMO)

## 2020-03-16 ENCOUNTER — Emergency Department (HOSPITAL_COMMUNITY)
Admission: EM | Admit: 2020-03-16 | Discharge: 2020-03-17 | Disposition: A | Discharge status: Home / Self Care | Payer: Managed Care, Other (non HMO) | Attending: Emergency Medicine | Admitting: Emergency Medicine

## 2020-03-16 DIAGNOSIS — J45909 Unspecified asthma, uncomplicated: Secondary | ICD-10-CM | POA: Insufficient documentation

## 2020-03-16 DIAGNOSIS — Z85118 Personal history of other malignant neoplasm of bronchus and lung: Secondary | ICD-10-CM | POA: Insufficient documentation

## 2020-03-16 DIAGNOSIS — Z7951 Long term (current) use of inhaled steroids: Secondary | ICD-10-CM | POA: Diagnosis not present

## 2020-03-16 DIAGNOSIS — M791 Myalgia, unspecified site: Secondary | ICD-10-CM | POA: Diagnosis present

## 2020-03-16 DIAGNOSIS — B349 Viral infection, unspecified: Secondary | ICD-10-CM | POA: Diagnosis not present

## 2020-03-16 DIAGNOSIS — Z87891 Personal history of nicotine dependence: Secondary | ICD-10-CM | POA: Insufficient documentation

## 2020-03-16 DIAGNOSIS — Z20822 Contact with and (suspected) exposure to covid-19: Secondary | ICD-10-CM | POA: Insufficient documentation

## 2020-03-16 LAB — CBC WITH DIFFERENTIAL/PLATELET
Abs Immature Granulocytes: 0.01 10*3/uL (ref 0.00–0.07)
Basophils Absolute: 0 10*3/uL (ref 0.0–0.1)
Basophils Relative: 1 %
Eosinophils Absolute: 0 10*3/uL (ref 0.0–0.5)
Eosinophils Relative: 0 %
HCT: 29.2 % — ABNORMAL LOW (ref 36.0–46.0)
Hemoglobin: 9.6 g/dL — ABNORMAL LOW (ref 12.0–15.0)
Immature Granulocytes: 0 %
Lymphocytes Relative: 12 %
Lymphs Abs: 0.3 10*3/uL — ABNORMAL LOW (ref 0.7–4.0)
MCH: 31.7 pg (ref 26.0–34.0)
MCHC: 32.9 g/dL (ref 30.0–36.0)
MCV: 96.4 fL (ref 80.0–100.0)
Monocytes Absolute: 0.1 10*3/uL (ref 0.1–1.0)
Monocytes Relative: 3 %
Neutro Abs: 2.4 10*3/uL (ref 1.7–7.7)
Neutrophils Relative %: 84 %
Platelets: 206 10*3/uL (ref 150–400)
RBC: 3.03 MIL/uL — ABNORMAL LOW (ref 3.87–5.11)
RDW: 17.2 % — ABNORMAL HIGH (ref 11.5–15.5)
WBC: 2.8 10*3/uL — ABNORMAL LOW (ref 4.0–10.5)
nRBC: 0 % (ref 0.0–0.2)

## 2020-03-16 LAB — COMPREHENSIVE METABOLIC PANEL
ALT: 22 U/L (ref 0–44)
AST: 33 U/L (ref 15–41)
Albumin: 3.3 g/dL — ABNORMAL LOW (ref 3.5–5.0)
Alkaline Phosphatase: 49 U/L (ref 38–126)
Anion gap: 13 (ref 5–15)
BUN: 21 mg/dL (ref 8–23)
CO2: 24 mmol/L (ref 22–32)
Calcium: 8.6 mg/dL — ABNORMAL LOW (ref 8.9–10.3)
Chloride: 98 mmol/L (ref 98–111)
Creatinine, Ser: 0.87 mg/dL (ref 0.44–1.00)
GFR, Estimated: >60 mL/min (ref >60)
Glucose, Bld: 127 mg/dL — ABNORMAL HIGH (ref 70–99)
Potassium: 3.4 mmol/L — ABNORMAL LOW (ref 3.5–5.1)
Sodium: 135 mmol/L (ref 135–145)
Total Bilirubin: 0.5 mg/dL (ref 0.3–1.2)
Total Protein: 6.6 g/dL (ref 6.5–8.1)

## 2020-03-16 LAB — RESP PANEL BY RT-PCR (FLU A&B, COVID) ARPGX2
Influenza A by PCR: NEGATIVE
Influenza B by PCR: NEGATIVE
SARS Coronavirus 2 by RT PCR: NEGATIVE

## 2020-03-16 NOTE — ED Triage Notes (Addendum)
Pt reports shob, fever, chills, body aches, and intermittent fevers as high as 100.4 at home. Pt sts chemo tx q 3weeks for lung cancer. Last treatment this week. Wears 3L [place] at baseline.

## 2020-03-16 NOTE — ED Provider Notes (Signed)
[facility] DEPT Provider Note: Georgena Spurling, MD, FACEP  CSN: [id number] MRN: [id number] ARRIVAL: 03/16/20 at [facility] [place]: [place]  Generalized Body Aches   HISTORY OF PRESENT ILLNESS  03/16/20 10:46 PM Elizabeth Mayer is a 59 y.o. female who is currently undergoing chemotherapy for non-small cell lung cancer metastatic to the brain.  Her last treatment was earlier this week.  She is here with fever to 100.5, chills, body aches, generalized weakness and increased sleepiness since yesterday.  She is short of breath but states this is at her baseline.  She is having pain in her back and neck but believes these are due to previous radiation therapy.  She did have her steroid (dexamethasone?)  Discontinued 2 days ago.  She rates her pain as a 4 out of 10.  She did get some relief with hydrocodone yesterday.  She has had no dysuria.   Past Medical History:  Diagnosis Date  . Anemia   . Angina 1982   related to stress  . Asthma    in the past, not now- never inhlaer use  . Complication of anesthesia    states anesthesia made her hair fall out  . Constipation   . Fatigue   . GERD (gastroesophageal reflux disease)   . Heart murmur 1970s  . Ingrown toenail   . Lung cancer ([place] [facility]) 10/2019   metastatic disease to the brain  . On home oxygen therapy    2.2 lpm, 24 hours a day  . Past heart attack 1980-1981   pt states she passed out and woke up in hospital- told she had heart attack, but then dr said he couldn't find anything wrong.  . Pneumonia     Past Surgical History:  Procedure Laterality Date  . CERVICAL DISC SURGERY  2000   Disc removed from neck   . ingrown toe nail surgery Bilateral   . IR IMAGING GUIDED PORT INSERTION  02/16/2020  . RADIOLOGY WITH ANESTHESIA N/A 12/05/2019   Procedure: MRI BRAIN WITH AND WITHOUT CONTRAST;  Surgeon: Radiologist, Medication, MD;  Location: [facility];  Service: Radiology;  Laterality: N/A;  . RADIOLOGY WITH ANESTHESIA N/A  01/02/2020   Procedure: MRI BRAIN WITH AND WITHOUT CONTRAST;  Surgeon: Radiologist, Medication, MD;  Location: [facility];  Service: Radiology;  Laterality: N/A;  . RADIOLOGY WITH ANESTHESIA N/A 02/20/2020   Procedure: MRI WITH ANESTHESIA OF BRAIN WITH AND WITHOUT CONTRAST;  Surgeon: Radiologist, Medication, MD;  Location: [facility];  Service: Radiology;  Laterality: N/A;    Family History  Problem Relation Age of Onset  . Arthritis Mother   . Heart failure Father     Social History   Tobacco Use  . Smoking status: Former Smoker    Packs/day: 1.00    Years: 20.00    Pack years: 20.00    Types: Cigarettes    Start date: 76    Quit date: 2001    Years since quitting: 20.9  . Smokeless tobacco: Never Used  Vaping Use  . Vaping Use: Never used  Substance Use Topics  . Alcohol use: No  . Drug use: No    Prior to Admission medications   Medication Sig Start Date End Date Taking? Authorizing Provider  acetaminophen (TYLENOL) 500 MG tablet Take 1,000 mg by mouth every 6 (six) hours as needed for moderate pain.     [provider]  albuterol (VENTOLIN HFA) 108 (90 Base) MCG/ACT inhaler Inhale 2 puffs into the lungs  every 6 (six) hours as needed for wheezing or shortness of breath. 12/15/19   Collene Gobble, MD  Artificial Saliva (BIOTENE DRY MOUTH MOISTURIZING) SOLN Use as directed 1 Dose in the mouth or throat as needed (dry mouth).    [provider]  budesonide-formoterol (SYMBICORT) 80-4.5 MCG/ACT inhaler Inhale 2 puffs into the lungs in the morning and at bedtime. 01/15/20   Collene Gobble, MD  Carboxymethylcellul-Glycerin (LUBRICATING EYE DROPS OP) Place 1 drop into both eyes daily as needed (irritation).    [provider]  Cream Base (WOUND CARE) CREA Apply 1 application topically as needed (wound care).    [provider]  dexamethasone (DECADRON) 4 MG tablet Take 1 tablet TID through 10-16, then 1 tablet BID through 10-30, then 1 tablet daily until  you see Dr. Isidore Moos to review MRI results. Patient taking differently: Take 4 mg by mouth daily.  01/15/20   Eppie Gibson, MD  DIGESTIVE ENZYMES PO Take 0.5-1 tablets by mouth 3 (three) times daily before meals.    [provider]  docusate sodium (COLACE) 100 MG capsule Take 100 mg by mouth daily as needed for mild constipation.     [provider]  doxycycline (VIBRA-TABS) 100 MG tablet Take 1 tablet (100 mg total) by mouth 2 (two) times daily. 02/22/20   Collene Gobble, MD  fexofenadine (ALLEGRA) 180 MG tablet Take 180 mg by mouth at bedtime.    [provider]  fluticasone (FLONASE) 50 MCG/ACT nasal spray Place 2 sprays into both nostrils daily.    [provider]  fluticasone (FLOVENT HFA) 44 MCG/ACT inhaler Inhale 2 puffs into the lungs 2 (two) times daily. 01/08/20 01/07/21  Matilde Haymaker, MD  folic acid (FOLVITE) 1 MG tablet Take 1 tablet (1 mg total) by mouth daily. 12/06/19   Curt Bears, MD  guaiFENesin (MUCINEX) 600 MG 12 hr tablet Take 600 mg by mouth 2 (two) times daily.     [provider]  HYDROcodone-acetaminophen (HYCET) 7.5-325 mg/15 ml solution Take 10 mLs by mouth 2 (two) times daily. For pain from cancer and radiation reactions 01/18/20   Gery Pray, MD  HYDROcodone-acetaminophen (NORCO/VICODIN) 5-325 MG tablet Take 1-2 tablets by mouth every 6 (six) hours as needed for moderate pain. 11/14/19   Minette Brine, FNP  lidocaine (XYLOCAINE) 2 % solution Use as directed 15 mLs in the mouth or throat as needed for mouth pain. Swallow 30 min prior to meals and at bedtime for throat/esophagus pain 12/18/19   Gery Pray, MD  lidocaine-prilocaine (EMLA) cream Apply 1 application topically as needed. Patient taking differently: Apply 1 application topically as needed (port access).  01/25/20   Heilingoetter, Cassandra L, PA-C  LORazepam (ATIVAN) 0.5 MG tablet Please take 1 tablet 30 minutes before your scan as needed for anxiety 01/25/20    Heilingoetter, Cassandra L, PA-C  LORazepam (ATIVAN) 2 MG tablet Take 1 tab 30 min before radiation treatment to the brain. 01/04/20   Eppie Gibson, MD  Multiple Vitamin (MULTIVITAMIN WITH MINERALS) TABS tablet Take 1 tablet by mouth daily.    [provider]  neomycin-bacitracin-polymyxin (NEOSPORIN) ointment Apply 1 application topically daily as needed for wound care.    [provider]  neomycin-polymyxin-hydrocortisone (CORTISPORIN) OTIC solution Apply 1-2 drops to toe after soaking BID Patient taking differently: 1-2 drops See admin instructions. Apply 1-2 drops to toe after soaking twice daily 01/18/20   Wallene Huh, DPM  omeprazole (PRILOSEC) 20 MG capsule Take 1 capsule (  20 mg total) by mouth daily. Take as long as you are on dexamethasone to protect from stomach irritation. 01/15/20   Eppie Gibson, MD  ondansetron (ZOFRAN ODT) 8 MG disintegrating tablet Take 1 tablet (8 mg total) by mouth every 8 (eight) hours as needed for nausea or vomiting. 12/18/19   Heilingoetter, Cassandra L, PA-C  ondansetron (ZOFRAN) 4 MG tablet Take 1 tablet (4 mg total) by mouth every 8 (eight) hours as needed for nausea or vomiting. 11/20/19   Gery Pray, MD  orphenadrine (NORFLEX) 100 MG tablet Take 1 tablet (100 mg total) by mouth 2 (two) times daily as needed for muscle spasms. 11/13/19 11/12/20  Minette Brine, FNP  OXYGEN Inhale 2.2 L/min into the lungs continuous.    [provider]  potassium chloride 20 MEQ/15ML (10%) SOLN Take 15 mLs (20 mEq total) by mouth 2 (two) times daily. Patient taking differently: Take 20 mEq by mouth daily.  01/18/20   Heilingoetter, Cassandra L, PA-C  potassium chloride SA (KLOR-CON) 20 MEQ tablet Take 1 tablet (20 mEq total) by mouth 2 (two) times daily. 01/18/20   Heilingoetter, Cassandra L, PA-C  prochlorperazine (COMPAZINE) 10 MG tablet Take 1 tablet (10 mg total) by mouth every 6 (six) hours as needed. Patient taking differently: Take 10 mg by  mouth every 6 (six) hours as needed for nausea or vomiting.  11/21/19   Heilingoetter, Cassandra L, PA-C  sucralfate (CARAFATE) 1 g tablet Take 1 tablet (1 g total) by mouth 4 (four) times daily -  with meals and at bedtime. 12/25/19   Heilingoetter, Cassandra L, PA-C    Allergies Pseudoephedrine, Gabapentin, Mobic [meloxicam], and Penicillins   REVIEW OF SYSTEMS  Negative except as noted here or in the History of Present Illness.   PHYSICAL EXAMINATION  Initial Vital Signs Blood pressure 117/71, pulse (!) 103, temperature 97.9 F (36.6 C), temperature source Oral, resp. rate 20, height 5\' 5"  (1.651 m), weight 68 kg, SpO2 95 %.  Examination General: Well-developed, well-nourished female in no acute distress; appearance consistent with age of record HENT: normocephalic; atraumatic Eyes: pupils equal, round and reactive to light; extraocular muscles intact Neck: supple Heart: regular rate and rhythm Lungs: clear to auscultation bilaterally Abdomen: soft; nondistended; nontender; bowel sounds present Extremities: No deformity; full range of motion Neurologic: Awake, alert and oriented; motor function intact in all extremities and symmetric; no facial droop Skin: Warm and dry Psychiatric: Normal mood and affect   RESULTS  Summary of this visit's results, reviewed and interpreted by myself:   EKG Interpretation  Date/Time:    Ventricular Rate:    PR Interval:    QRS Duration:   QT Interval:    QTC Calculation:   R Axis:     Text Interpretation:        Laboratory Studies: Results for orders placed or performed during the hospital encounter of 03/16/20 (from the past 24 hour(s))  Resp Panel by RT-PCR (Flu A&B, Covid) Nasopharyngeal Swab     Status: None   Collection Time: 03/16/20  7:25 PM   Specimen: Nasopharyngeal Swab; Nasopharyngeal(NP) swabs in vial transport medium  Result Value Ref Range   SARS Coronavirus 2 by RT PCR NEGATIVE NEGATIVE   Influenza A by PCR NEGATIVE  NEGATIVE   Influenza B by PCR NEGATIVE NEGATIVE  CBC with Differential     Status: Abnormal   Collection Time: 03/16/20  7:31 PM  Result Value Ref Range   WBC 2.8 (L) 4.0 - 10.5 K/uL  RBC 3.03 (L) 3.87 - 5.11 MIL/uL   Hemoglobin 9.6 (L) 12.0 - 15.0 g/dL   HCT 29.2 (L) 36.0 - 46.0 %   MCV 96.4 80.0 - 100.0 fL   MCH 31.7 26.0 - 34.0 pg   MCHC 32.9 30.0 - 36.0 g/dL   RDW 17.2 (H) 11.5 - 15.5 %   Platelets 206 150 - 400 K/uL   nRBC 0.0 0.0 - 0.2 %   Neutrophils Relative % 84 %   Neutro Abs 2.4 1.7 - 7.7 K/uL   Lymphocytes Relative 12 %   Lymphs Abs 0.3 (L) 0.7 - 4.0 K/uL   Monocytes Relative 3 %   Monocytes Absolute 0.1 0.1 - 1.0 K/uL   Eosinophils Relative 0 %   Eosinophils Absolute 0.0 0.0 - 0.5 K/uL   Basophils Relative 1 %   Basophils Absolute 0.0 0.0 - 0.1 K/uL   Immature Granulocytes 0 %   Abs Immature Granulocytes 0.01 0.00 - 0.07 K/uL   Ovalocytes PRESENT   Comprehensive metabolic panel     Status: Abnormal   Collection Time: 03/16/20  7:31 PM  Result Value Ref Range   Sodium 135 135 - 145 mmol/L   Potassium 3.4 (L) 3.5 - 5.1 mmol/L   Chloride 98 98 - 111 mmol/L   CO2 24 22 - 32 mmol/L   Glucose, Bld 127 (H) 70 - 99 mg/dL   BUN 21 8 - 23 mg/dL   Creatinine, Ser 0.87 0.44 - 1.00 mg/dL   Calcium 8.6 (L) 8.9 - 10.3 mg/dL   Total Protein 6.6 6.5 - 8.1 g/dL   Albumin 3.3 (L) 3.5 - 5.0 g/dL   AST 33 15 - 41 U/L   ALT 22 0 - 44 U/L   Alkaline Phosphatase 49 38 - 126 U/L   Total Bilirubin 0.5 0.3 - 1.2 mg/dL   GFR, Estimated >60 >60 mL/min   Anion gap 13 5 - 15  Urinalysis, Routine w reflex microscopic Urine, Clean Catch     Status: None   Collection Time: 03/16/20 10:33 PM  Result Value Ref Range   Color, Urine YELLOW YELLOW   APPearance CLEAR CLEAR   Specific Gravity, Urine 1.009 1.005 - 1.030   pH 5.0 5.0 - 8.0   Glucose, UA NEGATIVE NEGATIVE mg/dL   Hgb urine dipstick NEGATIVE NEGATIVE   Bilirubin Urine NEGATIVE NEGATIVE   Ketones, ur NEGATIVE NEGATIVE mg/dL    Protein, ur NEGATIVE NEGATIVE mg/dL   Nitrite NEGATIVE NEGATIVE   Leukocytes,Ua NEGATIVE NEGATIVE   Imaging Studies: DG Chest 2 View  Result Date: 03/16/2020 CLINICAL DATA:  Shortness of breath, lung cancer EXAM: CHEST - 2 VIEW COMPARISON:  11/28/2019 PET-CT, 11/20/2018 FINDINGS: The heart size and mediastinal contours are within normal limits. Right chest port catheter. No significant change in radiographic appearance of a right hilar mass. Bibasilar atelectasis or scarring. No acute appearing airspace opacity. The visualized skeletal structures are unremarkable. IMPRESSION: 1. No acute abnormality of the lungs. 2. No significant change in radiographic appearance of a right hilar mass. Electronically Signed   By: Eddie Candle M.D.   On: 03/16/2020 19:47    ED COURSE and MDM  Nursing notes, initial and subsequent vitals signs, including pulse oximetry, reviewed and interpreted by myself.  Vitals:   03/16/20 2330 03/17/20 0000 03/17/20 0015 03/17/20 0030  BP: 118/75 119/74 114/70 115/73  Pulse: 89 84 85 89  Resp: 18   18  Temp:      TempSrc:  SpO2: 100% 100% 100% 99%  Weight:      Height:       Medications - No data to display  12:34 AM Patient advised of reassuring work-up.  No evidence of significant abnormalities.  Symptoms likely due to viral illness other than Covid or influenza.  Patient advised to return if symptoms are worsening.  PROCEDURES  Procedures   ED DIAGNOSES     ICD-10-CM   1. Viral illness  B34.9        Datra Clary, MD 03/17/20 [phone]

## 2020-03-17 LAB — URINALYSIS, ROUTINE W REFLEX MICROSCOPIC
Bilirubin Urine: NEGATIVE
Glucose, UA: NEGATIVE mg/dL
Hgb urine dipstick: NEGATIVE
Ketones, ur: NEGATIVE mg/dL
Leukocytes,Ua: NEGATIVE
Nitrite: NEGATIVE
Protein, ur: NEGATIVE mg/dL
Specific Gravity, Urine: 1.009 (ref 1.005–1.030)
pH: 5 (ref 5.0–8.0)

## 2020-03-18 ENCOUNTER — Other Ambulatory Visit: Payer: Managed Care, Other (non HMO)

## 2020-03-18 ENCOUNTER — Telehealth: Payer: Self-pay | Admitting: Physician Assistant

## 2020-03-18 ENCOUNTER — Telehealth: Payer: Self-pay | Admitting: Internal Medicine

## 2020-03-18 NOTE — Telephone Encounter (Signed)
Cancelled weekly labs and added appts per los. Called and spoke with patient. Patient stated she will not be getting CT scan done therefore will probably not need the other appts. Spoke with Julien Nordmann, He explained that if she does not get CT scan done then he will not be able to continue her treatment. Per patient and MD, ok to cancel appts

## 2020-03-22 ENCOUNTER — Other Ambulatory Visit: Payer: Self-pay | Admitting: Hematology and Oncology

## 2020-03-22 ENCOUNTER — Inpatient Hospital Stay (HOSPITAL_BASED_OUTPATIENT_CLINIC_OR_DEPARTMENT_OTHER): Payer: Managed Care, Other (non HMO) | Admitting: Hematology and Oncology

## 2020-03-22 ENCOUNTER — Ambulatory Visit
Admission: RE | Admit: 2020-03-22 | Discharge: 2020-03-22 | Disposition: A | Discharge status: Home / Self Care | Payer: Managed Care, Other (non HMO) | Source: Ambulatory Visit | Attending: Physician Assistant | Admitting: Physician Assistant

## 2020-03-22 ENCOUNTER — Inpatient Hospital Stay: Payer: Managed Care, Other (non HMO)

## 2020-03-22 ENCOUNTER — Other Ambulatory Visit: Payer: Self-pay | Admitting: *Deleted

## 2020-03-22 ENCOUNTER — Other Ambulatory Visit: Payer: Self-pay

## 2020-03-22 VITALS — BP 150/74 | HR 97 | Temp 98.5°F | Resp 18 | Ht 65.0 in | Wt 151.0 lb

## 2020-03-22 DIAGNOSIS — C3491 Malignant neoplasm of unspecified part of right bronchus or lung: Secondary | ICD-10-CM

## 2020-03-22 DIAGNOSIS — Z95828 Presence of other vascular implants and grafts: Secondary | ICD-10-CM | POA: Diagnosis not present

## 2020-03-22 DIAGNOSIS — C7931 Secondary malignant neoplasm of brain: Secondary | ICD-10-CM | POA: Diagnosis not present

## 2020-03-22 LAB — CMP (CANCER CENTER ONLY)
ALT: 14 U/L (ref 0–44)
AST: 25 U/L (ref 15–41)
Albumin: 3 g/dL — ABNORMAL LOW (ref 3.5–5.0)
Alkaline Phosphatase: 46 U/L (ref 38–126)
Anion gap: 11 (ref 5–15)
BUN: 6 mg/dL — ABNORMAL LOW (ref 8–23)
CO2: 27 mmol/L (ref 22–32)
Calcium: 8.8 mg/dL — ABNORMAL LOW (ref 8.9–10.3)
Chloride: 105 mmol/L (ref 98–111)
Creatinine: 0.96 mg/dL (ref 0.44–1.00)
GFR, Estimated: >60 mL/min (ref >60)
Glucose, Bld: 102 mg/dL — ABNORMAL HIGH (ref 70–99)
Potassium: 3.6 mmol/L (ref 3.5–5.1)
Sodium: 143 mmol/L (ref 135–145)
Total Bilirubin: 0.2 mg/dL — ABNORMAL LOW (ref 0.3–1.2)
Total Protein: 6.2 g/dL — ABNORMAL LOW (ref 6.5–8.1)

## 2020-03-22 LAB — TSH: TSH: 0.594 u[IU]/mL (ref 0.308–3.960)

## 2020-03-22 LAB — CBC WITH DIFFERENTIAL (CANCER CENTER ONLY)
Abs Immature Granulocytes: 0.02 10*3/uL (ref 0.00–0.07)
Basophils Absolute: 0 10*3/uL (ref 0.0–0.1)
Basophils Relative: 1 %
Eosinophils Absolute: 0 10*3/uL (ref 0.0–0.5)
Eosinophils Relative: 1 %
HCT: 26.3 % — ABNORMAL LOW (ref 36.0–46.0)
Hemoglobin: 8.5 g/dL — ABNORMAL LOW (ref 12.0–15.0)
Immature Granulocytes: 1 %
Lymphocytes Relative: 16 %
Lymphs Abs: 0.3 10*3/uL — ABNORMAL LOW (ref 0.7–4.0)
MCH: 30.8 pg (ref 26.0–34.0)
MCHC: 32.3 g/dL (ref 30.0–36.0)
MCV: 95.3 fL (ref 80.0–100.0)
Monocytes Absolute: 0.7 10*3/uL (ref 0.1–1.0)
Monocytes Relative: 38 %
Neutro Abs: 0.8 10*3/uL — ABNORMAL LOW (ref 1.7–7.7)
Neutrophils Relative %: 43 %
Platelet Count: 129 10*3/uL — ABNORMAL LOW (ref 150–400)
RBC: 2.76 MIL/uL — ABNORMAL LOW (ref 3.87–5.11)
RDW: 16.6 % — ABNORMAL HIGH (ref 11.5–15.5)
WBC Count: 1.8 10*3/uL — ABNORMAL LOW (ref 4.0–10.5)
nRBC: 0 % (ref 0.0–0.2)

## 2020-03-22 LAB — MAGNESIUM: Magnesium: 1.6 mg/dL — ABNORMAL LOW (ref 1.7–2.4)

## 2020-03-22 MED ORDER — IOPAMIDOL (ISOVUE-300) INJECTION 61%
125.0000 mL | Freq: Once | INTRAVENOUS | Status: AC | PRN
  Administered 2020-03-22: 15:00:00 125 mL via INTRAVENOUS

## 2020-03-22 MED ORDER — HEPARIN SOD (PORK) LOCK FLUSH 100 UNIT/ML IV SOLN
500.0000 [IU] | Freq: Once | INTRAVENOUS | Status: AC
  Administered 2020-03-22: 500 [IU] via INTRAVENOUS

## 2020-03-22 MED ORDER — SODIUM CHLORIDE 0.9% FLUSH
10.0000 mL | Freq: Once | INTRAVENOUS | Status: AC
  Administered 2020-03-22: 09:00:00 10 mL
  Filled 2020-03-22: qty 10

## 2020-03-22 MED ORDER — SODIUM CHLORIDE 0.9% FLUSH
10.0000 mL | INTRAVENOUS | Status: DC | PRN
  Administered 2020-03-22: 10 mL via INTRAVENOUS

## 2020-03-22 NOTE — Progress Notes (Signed)
[place] [phone]   [phone]  PROGRESS NOTE  Patient Care Team: Minette Brine, FNP as PCP - General (General Practice)  Hematological/Oncological History # Metastatic Adenocarcinoma of the Lung #Brain Metastasis 1) 11/08/2019: establish care with Dr. Julien Nordmann and his PA Cassie Heilingoetter. Noted to have widely metastatic lung cancer with involvement of the brain, lymph nodes, and right lung.  2) 12/04/2019: palliative radiation to the right lung mass/cervical adenopathy 3) 9/7/02021: started therapy with Carbo/Pem/Pem 4) 9/29-10/09/2019: patient underwent SRS to the brain mets 5) 03/22/2020: repeat CT C/A/P and neck scheduled. Transfer care to Dr. Lorenso Courier.    Interval History:  Elizabeth Mayer 59 y.o. female with medical history significant for metastatic adenocarcinoma of the lung who presents to transfer care to another Oncology provider. The patient's last visit was on 03/12/2020 with Dr. Julien Nordmann. In the interim since the last visit the patient has requested to transfer care to another oncological provider.   On exam today Elizabeth Mayer reports that she tolerated her first treatment of chemotherapy relatively well.  She reports that she did have some fatigue a few days after the first treatment.  She also endorses having some occasional issues with shortness of breath.  She notes that she does have a chronic issue with hip pain and that her port is sore quite frequently.  Currently denies any fevers, chills, sweats, nausea, vomiting or diarrhea.  A full 10 point ROS is listed below.  The bulk of our discussion today focused on the diagnosis of metastatic adenocarcinoma and the treatment options moving forward.  We also discussed the importance of the CT scan of the chest abdomen pelvis in order to determine the effectiveness of the current regimen.  She voiced her understanding of this plan and was agreeable to treatment moving forward.  MEDICAL  HISTORY:  Past Medical History:  Diagnosis Date  . Anemia   . Angina 1982   related to stress  . Asthma    in the past, not now- never inhlaer use  . Complication of anesthesia    states anesthesia made her hair fall out  . Constipation   . Fatigue   . GERD (gastroesophageal reflux disease)   . Heart murmur 1970s  . Ingrown toenail   . Lung cancer ([facility]) 10/2019   metastatic disease to the brain  . On home oxygen therapy    2.2 lpm, 24 hours a day  . Past heart attack 1980-1981   pt states she passed out and woke up in hospital- told she had heart attack, but then dr said he couldn't find anything wrong.  . Pneumonia     SURGICAL HISTORY: Past Surgical History:  Procedure Laterality Date  . CERVICAL DISC SURGERY  2000   Disc removed from neck   . ingrown toe nail surgery Bilateral   . IR IMAGING GUIDED PORT INSERTION  02/16/2020  . RADIOLOGY WITH ANESTHESIA N/A 12/05/2019   Procedure: MRI BRAIN WITH AND WITHOUT CONTRAST;  Surgeon: Radiologist, Medication, MD;  Location: [facility];  Service: Radiology;  Laterality: N/A;  . RADIOLOGY WITH ANESTHESIA N/A 01/02/2020   Procedure: MRI BRAIN WITH AND WITHOUT CONTRAST;  Surgeon: Radiologist, Medication, MD;  Location: [facility];  Service: Radiology;  Laterality: N/A;  . RADIOLOGY WITH ANESTHESIA N/A 02/20/2020   Procedure: MRI WITH ANESTHESIA OF BRAIN WITH AND WITHOUT CONTRAST;  Surgeon: Radiologist, Medication, MD;  Location: [facility];  Service: Radiology;  Laterality: N/A;    SOCIAL HISTORY: Social History  Socioeconomic History  . Marital status: Married    Spouse name: Not on file  . Number of children: 0  . Years of education: Not on file  . Highest education level: Not on file  Occupational History  . Occupation: Secondary school teacher  Tobacco Use  . Smoking status: Former Smoker    Packs/day: 1.00    Years: 20.00    Pack years: 20.00    Types: Cigarettes    Start date: 36    Quit date: 2001    Years since quitting: 20.9  .  Smokeless tobacco: Never Used  Vaping Use  . Vaping Use: Never used  Substance and Sexual Activity  . Alcohol use: No  . Drug use: No  . Sexual activity: Yes    Birth control/protection: None  Other Topics Concern  . Not on file  Social History Narrative  . Not on file   Social Determinants of Health   Financial Resource Strain: High Risk  . Difficulty of Paying Living Expenses: Hard  Food Insecurity: No Food Insecurity  . Worried About Charity fundraiser in the Last Year: Never true  . Ran Out of Food in the Last Year: Never true  Transportation Needs: No Transportation Needs  . Lack of Transportation (Medical): No  . Lack of Transportation (Non-Medical): No  Physical Activity: Inactive  . Days of Exercise per Week: 0 days  . Minutes of Exercise per Session: 0 min  Stress: Not on file  Social Connections: Moderately Isolated  . Frequency of Communication with Friends and Family: Twice a week  . Frequency of Social Gatherings with Friends and Family: Twice a week  . Attends Religious Services: More than 4 times per year  . Active Member of Clubs or Organizations: No  . Attends Archivist Meetings: Not on file  . Marital Status: Separated  Intimate Partner Violence: Not on file    FAMILY HISTORY: Family History  Problem Relation Age of Onset  . Arthritis Mother   . Heart failure Father     ALLERGIES:  is allergic to pseudoephedrine, gabapentin, mobic [meloxicam], and penicillins.  MEDICATIONS:  Current Outpatient Medications  Medication Sig Dispense Refill  . acetaminophen (TYLENOL) 500 MG tablet Take 1,000 mg by mouth every 6 (six) hours as needed for moderate pain.     Marland Kitchen albuterol (VENTOLIN HFA) 108 (90 Base) MCG/ACT inhaler Inhale 2 puffs into the lungs every 6 (six) hours as needed for wheezing or shortness of breath. 18 g 6  . Artificial Saliva (BIOTENE DRY MOUTH MOISTURIZING) SOLN Use as directed 1 Dose in the mouth or throat as needed (dry mouth).     . budesonide-formoterol (SYMBICORT) 80-4.5 MCG/ACT inhaler Inhale 2 puffs into the lungs in the morning and at bedtime. 1 each 11  . Carboxymethylcellul-Glycerin (LUBRICATING EYE DROPS OP) Place 1 drop into both eyes daily as needed (irritation).    . Cream Base (WOUND CARE) CREA Apply 1 application topically as needed (wound care).    . DIGESTIVE ENZYMES PO Take 0.5-1 tablets by mouth 3 (three) times daily before meals.    . docusate sodium (COLACE) 100 MG capsule Take 100 mg by mouth daily as needed for mild constipation.     . fexofenadine (ALLEGRA) 180 MG tablet Take 180 mg by mouth at bedtime.    . fluticasone (FLONASE) 50 MCG/ACT nasal spray Place 2 sprays into both nostrils daily.    . folic acid (FOLVITE) 1 MG tablet Take 1 tablet (1 mg  total) by mouth daily. 30 tablet 4  . guaiFENesin (MUCINEX) 600 MG 12 hr tablet Take 600 mg by mouth 2 (two) times daily.     Marland Kitchen HYDROcodone-acetaminophen (HYCET) 7.5-325 mg/15 ml solution Take 10 mLs by mouth 2 (two) times daily. For pain from cancer and radiation reactions 473 mL 0  . lidocaine (XYLOCAINE) 2 % solution Use as directed 15 mLs in the mouth or throat as needed for mouth pain. Swallow 30 min prior to meals and at bedtime for throat/esophagus pain 250 mL 1  . lidocaine-prilocaine (EMLA) cream Apply 1 application topically as needed. (Patient taking differently: Apply 1 application topically as needed (port access). ) 30 g 2  . LORazepam (ATIVAN) 0.5 MG tablet Please take 1 tablet 30 minutes before your scan as needed for anxiety 2 tablet 0  . LORazepam (ATIVAN) 2 MG tablet Take 1 tab 30 min before radiation treatment to the brain. 1 tablet 0  . Multiple Vitamin (MULTIVITAMIN WITH MINERALS) TABS tablet Take 1 tablet by mouth daily.    Marland Kitchen neomycin-polymyxin-hydrocortisone (CORTISPORIN) OTIC solution Apply 1-2 drops to toe after soaking BID (Patient taking differently: 1-2 drops See admin instructions. Apply 1-2 drops to toe after soaking twice  daily) 10 mL 1  . omeprazole (PRILOSEC) 20 MG capsule Take 1 capsule (20 mg total) by mouth daily. Take as long as you are on dexamethasone to protect from stomach irritation. (Patient not taking: Reported on 03/22/2020) 30 capsule 2  . ondansetron (ZOFRAN ODT) 8 MG disintegrating tablet Take 1 tablet (8 mg total) by mouth every 8 (eight) hours as needed for nausea or vomiting. 30 tablet 3  . ondansetron (ZOFRAN) 4 MG tablet Take 1 tablet (4 mg total) by mouth every 8 (eight) hours as needed for nausea or vomiting. 20 tablet 0  . orphenadrine (NORFLEX) 100 MG tablet Take 1 tablet (100 mg total) by mouth 2 (two) times daily as needed for muscle spasms. 60 tablet 1  . OXYGEN Inhale 2.2 L/min into the lungs continuous.    . potassium chloride 20 MEQ/15ML (10%) SOLN Take 15 mLs (20 mEq total) by mouth 2 (two) times daily. (Patient taking differently: Take 20 mEq by mouth daily. ) 210 mL 0  . prochlorperazine (COMPAZINE) 10 MG tablet Take 1 tablet (10 mg total) by mouth every 6 (six) hours as needed. (Patient taking differently: Take 10 mg by mouth every 6 (six) hours as needed for nausea or vomiting. ) 30 tablet 2  . sucralfate (CARAFATE) 1 g tablet Take 1 tablet (1 g total) by mouth 4 (four) times daily -  with meals and at bedtime. 90 tablet 2   No current facility-administered medications for this visit.    REVIEW OF SYSTEMS:   Constitutional: ( - ) fevers, ( - )  chills , ( - ) night sweats Eyes: ( - ) blurriness of vision, ( - ) double vision, ( - ) watery eyes Ears, nose, mouth, throat, and face: ( - ) mucositis, ( - ) sore throat Respiratory: ( - ) cough, ( - ) dyspnea, ( - ) wheezes Cardiovascular: ( - ) palpitation, ( - ) chest discomfort, ( - ) lower extremity swelling Gastrointestinal:  ( - ) nausea, ( - ) heartburn, ( - ) change in bowel habits Skin: ( - ) abnormal skin rashes Lymphatics: ( - ) new lymphadenopathy, ( - ) easy bruising Neurological: ( - ) numbness, ( - ) tingling, ( - )  new weaknesses Behavioral/Psych: ( - )  mood change, ( - ) new changes  All other systems were reviewed with the patient and are negative.  PHYSICAL EXAMINATION: ECOG PERFORMANCE STATUS: 1 - Symptomatic but completely ambulatory  Vitals:   03/22/20 0923  BP: (!) 150/74  Pulse: 97  Resp: 18  Temp: 98.5 F (36.9 C)  SpO2: 97%   Filed Weights   03/22/20 0923  Weight: 151 lb (68.5 kg)    GENERAL: alert, no distress and comfortable SKIN: skin color, texture, turgor are normal, no rashes or significant lesions EYES: conjunctiva are pink and non-injected, sclera clear OROPHARYNX: no exudate, no erythema; lips, buccal mucosa, and tongue normal  NECK: supple, non-tender LYMPH:  no palpable lymphadenopathy in the cervical, axillary or inguinal LUNGS: clear to auscultation and percussion with normal breathing effort HEART: regular rate & rhythm and no murmurs and no lower extremity edema ABDOMEN: soft, non-tender, non-distended, normal bowel sounds Musculoskeletal: no cyanosis of digits and no clubbing  PSYCH: alert & oriented x 3, fluent speech NEURO: no focal motor/sensory deficits  LABORATORY DATA:  I have reviewed the data as listed CBC Latest Ref Rng & Units 03/22/2020 03/16/2020 03/12/2020  WBC 4.0 - 10.5 K/uL 1.8(L) 2.8(L) 4.6  Hemoglobin 12.0 - 15.0 g/dL 8.5(L) 9.6(L) 8.6(L)  Hematocrit 36.0 - 46.0 % 26.3(L) 29.2(L) 26.5(L)  Platelets 150 - 400 K/uL 129(L) 206 164    CMP Latest Ref Rng & Units 03/22/2020 03/16/2020 03/12/2020  Glucose 70 - 99 mg/dL 102(H) 127(H) 65(L)  BUN 8 - 23 mg/dL 6(L) 21 9  Creatinine 0.44 - 1.00 mg/dL 0.96 0.87 0.80  Sodium 135 - 145 mmol/L 143 135 145  Potassium 3.5 - 5.1 mmol/L 3.6 3.4(L) 3.3(L)  Chloride 98 - 111 mmol/L 105 98 107  CO2 22 - 32 mmol/L 27 24 30   Calcium 8.9 - 10.3 mg/dL 8.8(L) 8.6(L) 9.0  Total Protein 6.5 - 8.1 g/dL 6.2(L) 6.6 6.4(L)  Total Bilirubin 0.3 - 1.2 mg/dL 0.2(L) 0.5 <0.2(L)  Alkaline Phos 38 - 126 U/L 46 49 62  AST  15 - 41 U/L 25 33 24  ALT 0 - 44 U/L 14 22 23     RADIOGRAPHIC STUDIES: DG Chest 2 View  Result Date: 03/16/2020 CLINICAL DATA:  Shortness of breath, lung cancer EXAM: CHEST - 2 VIEW COMPARISON:  11/28/2019 PET-CT, 11/20/2018 FINDINGS: The heart size and mediastinal contours are within normal limits. Right chest port catheter. No significant change in radiographic appearance of a right hilar mass. Bibasilar atelectasis or scarring. No acute appearing airspace opacity. The visualized skeletal structures are unremarkable. IMPRESSION: 1. No acute abnormality of the lungs. 2. No significant change in radiographic appearance of a right hilar mass. Electronically Signed   By: Eddie Candle M.D.   On: 03/16/2020 19:47   CT Soft Tissue Neck W Contrast  Result Date: 03/23/2020 CLINICAL DATA:  Non-small cell lung cancer. EXAM: CT NECK WITH CONTRAST TECHNIQUE: Multidetector CT imaging of the neck was performed using the standard protocol following the bolus administration of intravenous contrast. CONTRAST:  175mL ISOVUE-300 IOPAMIDOL (ISOVUE-300) INJECTION 61% COMPARISON:  CT neck September 29, 2019. FINDINGS: Pharynx and larynx: Normal. No mass or swelling. Salivary glands: No inflammation, mass, or stone. Thyroid: Normal. Lymph nodes: Interval decrease in the previously identified cervical adenopathy. The largest lymph node is in the posterior left neck and now measures approximately 3.6 x 1.5 cm, previously 4.2 x 3.2 cm. Areas of internal hyperdensity likely represent necrosis. Additional smaller more posterior lymph nodes on the left are either  not visualized or decreased in size with an index node now measuring up to 5 mm (previously 14 mm). Similar size of an indeterminate 6 mm right level II node. Additional abnormal right-sided lymph nodes are decreased in size, including a 6 mm posterior node in the right lower neck (series 14, image 45), previously 10 mm. There is now amorphous soft tissue in the region of the  previously seen 15 mm lymph node in the posterior right lower neck. Vascular: Normal enhancement of the carotid arteries bilaterally. Opacified jugular veins. Limited intracranial: Unremarkable. Please see recent MRI head for characterization of intracranial metastatic disease. Visualized orbits: Not imaged. Mastoids and visualized paranasal sinuses: Visualized portions are clear. Skeleton: Similar solid fusion at C5-C6, likely postsurgical. Similar multilevel degenerative disc disease with endplate spurring, greatest at C4-C5 and C6-C7. Upper chest: Further evaluated on same day CT chest. IMPRESSION: 1. Findings consistent with treatment response in the neck with decreased bilateral adenopathy, largest on the left. 2. Please see same day CT chest for evaluation of the chest. Electronically Signed   By: Margaretha Sheffield MD   On: 03/23/2020 06:17   CT Chest W Contrast  Result Date: 03/25/2020 CLINICAL DATA:  Non-small cell lung cancer post chemotherapy and radiation therapy. EXAM: CT CHEST AND ABDOMEN WITH CONTRAST TECHNIQUE: Multidetector CT imaging of the chest and abdomen was performed following the standard protocol during bolus administration of intravenous contrast. CONTRAST:  126mL ISOVUE-300 IOPAMIDOL (ISOVUE-300) INJECTION 61% COMPARISON:  Prior CTs 10/17/2019.  PET-CT 11/20/2019. FINDINGS: CT CHEST FINDINGS Cardiovascular: Right IJ Port-A-Cath extends to the level of the inferior right atrium. No acute vascular findings are seen. There is mild aortic and great vessel atherosclerosis. The heart size is stable at the upper limits of normal. There is no significant pericardial fluid or thickening. Mediastinum/Nodes: Interval improvement in previously demonstrated extensive confluent lymphadenopathy in the superior mediastinum and right hilar regions. Right paratracheal soft tissue thickening remains, measuring up to 1.7 cm transverse on image 38/4. A previously demonstrated right axillary lymph node has  significantly decreased in size, now measuring 0.8 cm maximally on image 39/4. No new or enlarging lymph nodes. The thyroid gland, trachea and esophagus demonstrate no significant findings. Lungs/Pleura: Small bilateral pleural effusions. There is underlying moderate centrilobular and paraseptal emphysema. Previously demonstrated right infrahilar mass is not easily distinguished from adjacent atelectasis within the right middle and lower lobes, although appears smaller, measuring approximately 2.8 x 1.9 cm on image 67/4. Subsegmental atelectasis within the left lower lobe appears similar to the previous study. No new or enlarging nodules identified. Musculoskeletal/Chest wall: No chest wall mass or suspicious osseous findings. CT ABDOMEN FINDINGS Hepatobiliary: The liver is normal in density without suspicious focal abnormality. No evidence of gallstones, gallbladder wall thickening or biliary dilatation. Pancreas: Stable mild prominence of the main pancreatic duct. No focal pancreatic abnormality or surrounding inflammatory change. Spleen: Normal in size without focal abnormality. Adrenals/Urinary Tract: Stable 10 x 10 mm low-density left adrenal nodule (nonspecific attenuation of 49 HU), likely an adenoma based on stability. The right adrenal gland appears normal. No significant abnormalities of the kidneys or visualized ureters are identified. There is a probable tiny cyst in the interpolar region of the left kidney. Stomach/Bowel: No evidence of bowel wall thickening, distention or surrounding inflammatory change. Vascular/Lymphatic: There are no enlarged abdominal lymph nodes. Mild aortoiliac atherosclerosis without acute vascular findings. Other: No evidence of abdominal wall mass or hernia. No ascites. Musculoskeletal: No acute or significant osseous findings. Degenerative disc disease  at the lumbosacral junction. IMPRESSION: 1. Interval significant improvement in previously demonstrated extensive confluent  lymphadenopathy in the superior mediastinum and right hilar regions. 2. The right infrahilar mass is not easily distinguished from adjacent atelectasis within the right middle and lower lobes, although appears smaller. 3. Small bilateral pleural effusions and left basilar atelectasis. 4. No evidence of metastatic disease within the abdomen. 5. Stable small left adrenal nodule, likely an adenoma based on stability. 6. Aortic Atherosclerosis (ICD10-I70.0) and Emphysema (ICD10-J43.9). Electronically Signed   By: Richardean Sale M.D.   On: 03/25/2020 08:19   CT Abdomen W Contrast  Result Date: 03/25/2020 CLINICAL DATA:  Non-small cell lung cancer post chemotherapy and radiation therapy. EXAM: CT CHEST AND ABDOMEN WITH CONTRAST TECHNIQUE: Multidetector CT imaging of the chest and abdomen was performed following the standard protocol during bolus administration of intravenous contrast. CONTRAST:  175mL ISOVUE-300 IOPAMIDOL (ISOVUE-300) INJECTION 61% COMPARISON:  Prior CTs 10/17/2019.  PET-CT 11/20/2019. FINDINGS: CT CHEST FINDINGS Cardiovascular: Right IJ Port-A-Cath extends to the level of the inferior right atrium. No acute vascular findings are seen. There is mild aortic and great vessel atherosclerosis. The heart size is stable at the upper limits of normal. There is no significant pericardial fluid or thickening. Mediastinum/Nodes: Interval improvement in previously demonstrated extensive confluent lymphadenopathy in the superior mediastinum and right hilar regions. Right paratracheal soft tissue thickening remains, measuring up to 1.7 cm transverse on image 38/4. A previously demonstrated right axillary lymph node has significantly decreased in size, now measuring 0.8 cm maximally on image 39/4. No new or enlarging lymph nodes. The thyroid gland, trachea and esophagus demonstrate no significant findings. Lungs/Pleura: Small bilateral pleural effusions. There is underlying moderate centrilobular and paraseptal  emphysema. Previously demonstrated right infrahilar mass is not easily distinguished from adjacent atelectasis within the right middle and lower lobes, although appears smaller, measuring approximately 2.8 x 1.9 cm on image 67/4. Subsegmental atelectasis within the left lower lobe appears similar to the previous study. No new or enlarging nodules identified. Musculoskeletal/Chest wall: No chest wall mass or suspicious osseous findings. CT ABDOMEN FINDINGS Hepatobiliary: The liver is normal in density without suspicious focal abnormality. No evidence of gallstones, gallbladder wall thickening or biliary dilatation. Pancreas: Stable mild prominence of the main pancreatic duct. No focal pancreatic abnormality or surrounding inflammatory change. Spleen: Normal in size without focal abnormality. Adrenals/Urinary Tract: Stable 10 x 10 mm low-density left adrenal nodule (nonspecific attenuation of 49 HU), likely an adenoma based on stability. The right adrenal gland appears normal. No significant abnormalities of the kidneys or visualized ureters are identified. There is a probable tiny cyst in the interpolar region of the left kidney. Stomach/Bowel: No evidence of bowel wall thickening, distention or surrounding inflammatory change. Vascular/Lymphatic: There are no enlarged abdominal lymph nodes. Mild aortoiliac atherosclerosis without acute vascular findings. Other: No evidence of abdominal wall mass or hernia. No ascites. Musculoskeletal: No acute or significant osseous findings. Degenerative disc disease at the lumbosacral junction. IMPRESSION: 1. Interval significant improvement in previously demonstrated extensive confluent lymphadenopathy in the superior mediastinum and right hilar regions. 2. The right infrahilar mass is not easily distinguished from adjacent atelectasis within the right middle and lower lobes, although appears smaller. 3. Small bilateral pleural effusions and left basilar atelectasis. 4. No  evidence of metastatic disease within the abdomen. 5. Stable small left adrenal nodule, likely an adenoma based on stability. 6. Aortic Atherosclerosis (ICD10-I70.0) and Emphysema (ICD10-J43.9). Electronically Signed   By: Richardean Sale M.D.   On: 03/25/2020  08:19    ASSESSMENT & PLAN Elizabeth Mayer 60 y.o. female with medical history significant for metastatic adenocarcinoma of the lung who presents to transfer care to another Oncology provider.  After review the labs, the records, discussion with the patient the findings are most consistent with metastatic adenocarcinoma the lung without any targetable mutations.  The patient has been started on carboplatin pemetrexed pembrolizumab every 3 weeks with Dr. Julien Nordmann, however the patient has expressed a desire to transition to another provider.  As such would be happy to take up her care here.  She is already discontinued the carboplatin and is on pemetrexed pembrolizumab monotherapy.  She is currently scheduled for a repeat CT scan today in order to assure that this therapy has been working.  Assuming the CT scan shows response of disease we will continue this regimen with her next dose being administered the week of 04/04/2020.  After discussion with the patient and review of her blood work she appears appropriate for continued treatment at this time.  # Metastatic Adenocarcinoma of the Lung #Brain Metastasis --repeat scans scheduled for today to determine if the patient is having an appropriate response to treatment with Carbo/Pem/Pem chemotherapy.  --if scans show no progression will plan to continue with maintenance Pem/Pem --no targetable mutations noted on prior workup. --continue to follow with Dr. Laqueta Due Onc for metastatic spread to the brain.  --can consider docetaxel/ramucirumab at time of progression.  --RTC for chemo treatment on 04/04/2020 and subsequent treatment on 04/24/2020.   Orders Placed This Encounter  Procedures  . CBC with  Differential (Cancer Center Only)    Standing Status:   Standing    Number of Occurrences:   12    Standing Expiration Date:   03/29/2021  . CMP ([facility] only)    Standing Status:   Standing    Number of Occurrences:   12    Standing Expiration Date:   03/29/2021  . TSH    Standing Status:   Standing    Number of Occurrences:   12    Standing Expiration Date:   03/29/2021    All questions were answered. The patient knows to call the clinic with any problems, questions or concerns.  A total of more than 40 minutes were spent on this encounter and over half of that time was spent on counseling and coordination of care as outlined above.   Ledell Peoples, MD Department of Hematology/Oncology [facility] [facility] Phone: [phone] Pager: [phone] Email: [email or web address]  03/29/2020 1:37 PM

## 2020-03-25 ENCOUNTER — Other Ambulatory Visit: Payer: Managed Care, Other (non HMO)

## 2020-03-25 ENCOUNTER — Telehealth: Payer: Self-pay | Admitting: Hematology and Oncology

## 2020-03-25 NOTE — Telephone Encounter (Signed)
Called Ms. Schlauch to discuss the results of her CT scan. Fortunately the CT scan shows excellent response to her chemotherapy. We will plan to continue the pemetrexed and pembrolizumab as previously started by Dr. Julien Nordmann. We will plan for next cycle next week and our next visit with the patient approximately 3 weeks after that for her subsequent cycle.  Ledell Peoples, MD Department of Hematology/Oncology [place] at [facility] Phone: [phone] Pager: [phone] Email: [email or web address]

## 2020-03-28 ENCOUNTER — Other Ambulatory Visit: Payer: Self-pay | Admitting: Radiation Therapy

## 2020-03-28 ENCOUNTER — Telehealth: Payer: Self-pay | Admitting: Hematology and Oncology

## 2020-03-28 NOTE — Telephone Encounter (Signed)
Scheduled appt per 12/20 sch msg - pt is aware of appts added

## 2020-03-29 ENCOUNTER — Encounter: Payer: Self-pay | Admitting: Hematology and Oncology

## 2020-04-01 ENCOUNTER — Ambulatory Visit: Payer: Managed Care, Other (non HMO)

## 2020-04-01 ENCOUNTER — Other Ambulatory Visit: Payer: Managed Care, Other (non HMO)

## 2020-04-01 ENCOUNTER — Ambulatory Visit: Payer: Managed Care, Other (non HMO) | Admitting: Physician Assistant

## 2020-04-03 ENCOUNTER — Other Ambulatory Visit: Payer: Self-pay | Admitting: Physician Assistant

## 2020-04-03 ENCOUNTER — Other Ambulatory Visit: Payer: Self-pay | Admitting: *Deleted

## 2020-04-03 ENCOUNTER — Inpatient Hospital Stay: Payer: Managed Care, Other (non HMO)

## 2020-04-03 ENCOUNTER — Other Ambulatory Visit: Payer: Self-pay

## 2020-04-03 VITALS — BP 153/77 | HR 90 | Temp 98.2°F | Resp 20

## 2020-04-03 DIAGNOSIS — C3491 Malignant neoplasm of unspecified part of right bronchus or lung: Secondary | ICD-10-CM

## 2020-04-03 DIAGNOSIS — Z95828 Presence of other vascular implants and grafts: Secondary | ICD-10-CM

## 2020-04-03 DIAGNOSIS — C7931 Secondary malignant neoplasm of brain: Secondary | ICD-10-CM

## 2020-04-03 LAB — CBC WITH DIFFERENTIAL (CANCER CENTER ONLY)
Abs Immature Granulocytes: 0.03 10*3/uL (ref 0.00–0.07)
Basophils Absolute: 0 10*3/uL (ref 0.0–0.1)
Basophils Relative: 1 %
Eosinophils Absolute: 0.2 10*3/uL (ref 0.0–0.5)
Eosinophils Relative: 3 %
HCT: 26.5 % — ABNORMAL LOW (ref 36.0–46.0)
Hemoglobin: 8.4 g/dL — ABNORMAL LOW (ref 12.0–15.0)
Immature Granulocytes: 1 %
Lymphocytes Relative: 11 %
Lymphs Abs: 0.6 10*3/uL — ABNORMAL LOW (ref 0.7–4.0)
MCH: 30.2 pg (ref 26.0–34.0)
MCHC: 31.7 g/dL (ref 30.0–36.0)
MCV: 95.3 fL (ref 80.0–100.0)
Monocytes Absolute: 0.9 10*3/uL (ref 0.1–1.0)
Monocytes Relative: 16 %
Neutro Abs: 3.7 10*3/uL (ref 1.7–7.7)
Neutrophils Relative %: 68 %
Platelet Count: 260 10*3/uL (ref 150–400)
RBC: 2.78 MIL/uL — ABNORMAL LOW (ref 3.87–5.11)
RDW: 15.9 % — ABNORMAL HIGH (ref 11.5–15.5)
WBC Count: 5.4 10*3/uL (ref 4.0–10.5)
nRBC: 0 % (ref 0.0–0.2)

## 2020-04-03 LAB — CMP (CANCER CENTER ONLY)
ALT: 9 U/L (ref 0–44)
AST: 23 U/L (ref 15–41)
Albumin: 3 g/dL — ABNORMAL LOW (ref 3.5–5.0)
Alkaline Phosphatase: 42 U/L (ref 38–126)
Anion gap: 7 (ref 5–15)
BUN: 6 mg/dL — ABNORMAL LOW (ref 8–23)
CO2: 32 mmol/L (ref 22–32)
Calcium: 8.8 mg/dL — ABNORMAL LOW (ref 8.9–10.3)
Chloride: 104 mmol/L (ref 98–111)
Creatinine: 0.79 mg/dL (ref 0.44–1.00)
GFR, Estimated: >60 mL/min (ref >60)
Glucose, Bld: 80 mg/dL (ref 70–99)
Potassium: 3.2 mmol/L — ABNORMAL LOW (ref 3.5–5.1)
Sodium: 143 mmol/L (ref 135–145)
Total Bilirubin: <0.2 mg/dL — ABNORMAL LOW (ref 0.3–1.2)
Total Protein: 6.6 g/dL (ref 6.5–8.1)

## 2020-04-03 LAB — TSH: TSH: 0.814 u[IU]/mL (ref 0.308–3.960)

## 2020-04-03 MED ORDER — PROCHLORPERAZINE MALEATE 10 MG PO TABS
ORAL_TABLET | ORAL | Status: AC
  Filled 2020-04-03: qty 1

## 2020-04-03 MED ORDER — CYANOCOBALAMIN 1000 MCG/ML IJ SOLN
1000.0000 ug | Freq: Once | INTRAMUSCULAR | Status: AC
  Administered 2020-04-03: 14:00:00 1000 ug via INTRAMUSCULAR

## 2020-04-03 MED ORDER — SODIUM CHLORIDE 0.9% FLUSH
10.0000 mL | INTRAVENOUS | Status: DC | PRN
  Administered 2020-04-03: 16:00:00 10 mL
  Filled 2020-04-03: qty 10

## 2020-04-03 MED ORDER — CYANOCOBALAMIN 1000 MCG/ML IJ SOLN
INTRAMUSCULAR | Status: AC
  Filled 2020-04-03: qty 1

## 2020-04-03 MED ORDER — SODIUM CHLORIDE 0.9 % IV SOLN
Freq: Once | INTRAVENOUS | Status: AC
  Filled 2020-04-03: qty 250

## 2020-04-03 MED ORDER — SODIUM CHLORIDE 0.9 % IV SOLN
500.0000 mg/m2 | Freq: Once | INTRAVENOUS | Status: AC
  Administered 2020-04-03: 16:00:00 800 mg via INTRAVENOUS
  Filled 2020-04-03: qty 20

## 2020-04-03 MED ORDER — SODIUM CHLORIDE 0.9 % IV SOLN
200.0000 mg | Freq: Once | INTRAVENOUS | Status: AC
  Administered 2020-04-03: 15:00:00 200 mg via INTRAVENOUS
  Filled 2020-04-03: qty 8

## 2020-04-03 MED ORDER — PROCHLORPERAZINE MALEATE 10 MG PO TABS
10.0000 mg | ORAL_TABLET | Freq: Once | ORAL | Status: AC
Start: 2020-04-03 — End: 2020-04-03
  Administered 2020-04-03: 14:00:00 10 mg via ORAL

## 2020-04-03 MED ORDER — HEPARIN SOD (PORK) LOCK FLUSH 100 UNIT/ML IV SOLN
500.0000 [IU] | Freq: Once | INTRAVENOUS | Status: AC | PRN
Start: 2020-04-03 — End: 2020-04-03
  Administered 2020-04-03: 16:00:00 500 [IU]
  Filled 2020-04-03: qty 5

## 2020-04-03 MED ORDER — SODIUM CHLORIDE 0.9% FLUSH
10.0000 mL | Freq: Once | INTRAVENOUS | Status: AC
  Administered 2020-04-03: 10 mL
  Filled 2020-04-03: qty 10

## 2020-04-03 NOTE — Progress Notes (Signed)
Patient reports diffuse rash. No obvious rash noted on visual assessment of uncovered skin. States she has seen dermatology for same. Cream/ointment prescribed. Patient verbalizes improvement in rash since using.

## 2020-04-03 NOTE — Patient Instructions (Signed)
[facility] Discharge Instructions for Patients Receiving Chemotherapy  Today you received the following chemotherapy agents: pembrolizumab and pemetrexed.  To help prevent nausea and vomiting after your treatment, we encourage you to take your nausea medication as directed.   If you develop nausea and vomiting that is not controlled by your nausea medication, call the clinic.   BELOW ARE SYMPTOMS THAT SHOULD BE REPORTED IMMEDIATELY:  *FEVER GREATER THAN 100.5 F  *CHILLS WITH OR WITHOUT FEVER  NAUSEA AND VOMITING THAT IS NOT CONTROLLED WITH YOUR NAUSEA MEDICATION  *UNUSUAL SHORTNESS OF BREATH  *UNUSUAL BRUISING OR BLEEDING  TENDERNESS IN MOUTH AND THROAT WITH OR WITHOUT PRESENCE OF ULCERS  *URINARY PROBLEMS  *BOWEL PROBLEMS  UNUSUAL RASH Items with * indicate a potential emergency and should be followed up as soon as possible.  Feel free to call the clinic should you have any questions or concerns. The clinic phone number is [phone].  Please show the [place] at check-in to the Emergency Department and triage nurse.

## 2020-04-12 ENCOUNTER — Emergency Department (HOSPITAL_COMMUNITY)
Admission: EM | Admit: 2020-04-12 | Discharge: 2020-04-12 | Disposition: A | Discharge status: Home / Self Care | Payer: BC Managed Care – PPO | Attending: Emergency Medicine | Admitting: Emergency Medicine

## 2020-04-12 ENCOUNTER — Encounter (HOSPITAL_COMMUNITY): Payer: Self-pay | Admitting: *Deleted

## 2020-04-12 ENCOUNTER — Other Ambulatory Visit: Payer: Self-pay

## 2020-04-12 ENCOUNTER — Emergency Department (HOSPITAL_COMMUNITY): Payer: BC Managed Care – PPO

## 2020-04-12 DIAGNOSIS — J189 Pneumonia, unspecified organism: Secondary | ICD-10-CM | POA: Diagnosis not present

## 2020-04-12 DIAGNOSIS — Z20822 Contact with and (suspected) exposure to covid-19: Secondary | ICD-10-CM | POA: Insufficient documentation

## 2020-04-12 DIAGNOSIS — Z85118 Personal history of other malignant neoplasm of bronchus and lung: Secondary | ICD-10-CM | POA: Insufficient documentation

## 2020-04-12 DIAGNOSIS — J449 Chronic obstructive pulmonary disease, unspecified: Secondary | ICD-10-CM | POA: Insufficient documentation

## 2020-04-12 DIAGNOSIS — J45909 Unspecified asthma, uncomplicated: Secondary | ICD-10-CM | POA: Diagnosis not present

## 2020-04-12 DIAGNOSIS — J9811 Atelectasis: Secondary | ICD-10-CM | POA: Diagnosis not present

## 2020-04-12 DIAGNOSIS — R918 Other nonspecific abnormal finding of lung field: Secondary | ICD-10-CM | POA: Diagnosis not present

## 2020-04-12 DIAGNOSIS — R0602 Shortness of breath: Secondary | ICD-10-CM | POA: Diagnosis not present

## 2020-04-12 DIAGNOSIS — Z87891 Personal history of nicotine dependence: Secondary | ICD-10-CM | POA: Diagnosis not present

## 2020-04-12 DIAGNOSIS — R7989 Other specified abnormal findings of blood chemistry: Secondary | ICD-10-CM | POA: Diagnosis not present

## 2020-04-12 DIAGNOSIS — R079 Chest pain, unspecified: Secondary | ICD-10-CM | POA: Diagnosis not present

## 2020-04-12 LAB — CBC
HCT: 26.7 % — ABNORMAL LOW (ref 36.0–46.0)
Hemoglobin: 8.4 g/dL — ABNORMAL LOW (ref 12.0–15.0)
MCH: 29.9 pg (ref 26.0–34.0)
MCHC: 31.5 g/dL (ref 30.0–36.0)
MCV: 95 fL (ref 80.0–100.0)
Platelets: 129 10*3/uL — ABNORMAL LOW (ref 150–400)
RBC: 2.81 MIL/uL — ABNORMAL LOW (ref 3.87–5.11)
RDW: 14.8 % (ref 11.5–15.5)
WBC: 2.1 10*3/uL — ABNORMAL LOW (ref 4.0–10.5)
nRBC: 0 % (ref 0.0–0.2)

## 2020-04-12 LAB — TROPONIN I (HIGH SENSITIVITY)
Troponin I (High Sensitivity): 7 ng/L (ref <18)
Troponin I (High Sensitivity): 8 ng/L (ref <18)

## 2020-04-12 LAB — BASIC METABOLIC PANEL
Anion gap: 12 (ref 5–15)
BUN: 6 mg/dL — ABNORMAL LOW (ref 8–23)
CO2: 28 mmol/L (ref 22–32)
Calcium: 8.8 mg/dL — ABNORMAL LOW (ref 8.9–10.3)
Chloride: 102 mmol/L (ref 98–111)
Creatinine, Ser: 0.83 mg/dL (ref 0.44–1.00)
GFR, Estimated: >60 mL/min (ref >60)
Glucose, Bld: 125 mg/dL — ABNORMAL HIGH (ref 70–99)
Potassium: 3.2 mmol/L — ABNORMAL LOW (ref 3.5–5.1)
Sodium: 142 mmol/L (ref 135–145)

## 2020-04-12 LAB — BRAIN NATRIURETIC PEPTIDE: B Natriuretic Peptide: 76.3 pg/mL (ref 0.0–100.0)

## 2020-04-12 LAB — D-DIMER, QUANTITATIVE: D-Dimer, Quant: 1.4 ug/mL-FEU — ABNORMAL HIGH (ref 0.00–0.50)

## 2020-04-12 MED ORDER — POTASSIUM CHLORIDE CRYS ER 20 MEQ PO TBCR
40.0000 meq | EXTENDED_RELEASE_TABLET | Freq: Once | ORAL | Status: DC
  Filled 2020-04-12: qty 2

## 2020-04-12 MED ORDER — HEPARIN SOD (PORK) LOCK FLUSH 100 UNIT/ML IV SOLN
500.0000 [IU] | Freq: Once | INTRAVENOUS | Status: AC
  Administered 2020-04-12: 500 [IU]
  Filled 2020-04-12: qty 5

## 2020-04-12 MED ORDER — POTASSIUM CHLORIDE 20 MEQ PO PACK
40.0000 meq | PACK | Freq: Once | ORAL | Status: AC
  Administered 2020-04-12: 40 meq via ORAL
  Filled 2020-04-12: qty 2

## 2020-04-12 MED ORDER — LEVOFLOXACIN 750 MG PO TABS: 750.0000 mg | ORAL_TABLET | Freq: Every day | ORAL | 0 refills | Status: DC

## 2020-04-12 MED ORDER — LEVOFLOXACIN 750 MG PO TABS
750.0000 mg | ORAL_TABLET | Freq: Once | ORAL | Status: AC
  Administered 2020-04-12: 750 mg via ORAL
  Filled 2020-04-12: qty 1

## 2020-04-12 MED ORDER — IOHEXOL 350 MG/ML SOLN
75.0000 mL | Freq: Once | INTRAVENOUS | Status: AC | PRN
  Administered 2020-04-12: 75 mL via INTRAVENOUS

## 2020-04-12 NOTE — ED Notes (Signed)
Patient transported to CT 

## 2020-04-12 NOTE — Discharge Instructions (Signed)
Your CT scan shows pneumonia.  You are being given antibiotics.  It is important to follow-up with your oncologist as soon as possible.  If at any point your breathing becomes worse, you develop severe chest pain, coughing up blood, high fevers, or any other new/concerning symptoms then return to the ER for evaluation.  You are being tested for Covid-19.  You will have the results in the next 24 hours please quarantine until these results come back.

## 2020-04-12 NOTE — ED Notes (Signed)
Accessed implanted port in right chest. Flushed and saline locked. Blood return noted
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
## 2020-04-12 NOTE — ED Triage Notes (Addendum)
Pt w/ hx of lung cancer complains of shortness of breath, chest pain, ankle swelling. She uses 3L [place] at home

## 2020-04-13 LAB — SARS CORONAVIRUS 2 (TAT 6-24 HRS): SARS Coronavirus 2: NEGATIVE

## 2020-04-15 ENCOUNTER — Telehealth: Payer: Self-pay | Admitting: *Deleted

## 2020-04-15 NOTE — Telephone Encounter (Signed)
Received call from patient. She states that she was seen in the ED over the weekend. Diagnosis of pneumonia made. Covid negative.  She is currently on antibiotics. Advised to complete that course of ABX.  Advised that we will see her at her next appt on 04/25/19. Pt voiced understanding

## 2020-04-16 ENCOUNTER — Telehealth: Payer: Self-pay

## 2020-04-16 NOTE — Telephone Encounter (Signed)
Called to check on pt.

## 2020-04-22 ENCOUNTER — Telehealth: Payer: Self-pay

## 2020-04-22 NOTE — Telephone Encounter (Signed)
I called pt to schedule her a f/u from hospital left her a vm to call the office [facility]

## 2020-04-23 ENCOUNTER — Other Ambulatory Visit: Payer: Managed Care, Other (non HMO)

## 2020-04-23 ENCOUNTER — Ambulatory Visit: Payer: Managed Care, Other (non HMO)

## 2020-04-23 ENCOUNTER — Ambulatory Visit: Payer: Managed Care, Other (non HMO) | Admitting: Internal Medicine

## 2020-04-24 ENCOUNTER — Other Ambulatory Visit: Payer: Managed Care, Other (non HMO)

## 2020-04-24 ENCOUNTER — Ambulatory Visit: Payer: Managed Care, Other (non HMO)

## 2020-04-24 ENCOUNTER — Encounter: Payer: Self-pay | Admitting: Hematology and Oncology

## 2020-04-24 ENCOUNTER — Telehealth: Payer: Self-pay | Admitting: *Deleted

## 2020-04-24 ENCOUNTER — Ambulatory Visit: Payer: Managed Care, Other (non HMO) | Admitting: Hematology and Oncology

## 2020-04-24 NOTE — Progress Notes (Signed)
Received signed physician form for [place] for [place].  Just waiting for patient to complete their portion to submit.

## 2020-04-24 NOTE — Progress Notes (Signed)
Gave physician form for [employer] for [place] assistance to [employer] to be completed and returned to me.  Patient portion ready to be completed by patient at next visit.
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
## 2020-04-25 ENCOUNTER — Telehealth: Payer: Self-pay | Admitting: *Deleted

## 2020-04-25 NOTE — Telephone Encounter (Signed)
Received call from patient asking to reschedule her appts from 04/26/20 to next week due to the weather.  Scheduling message sent.  appts for tomorrow cancelled.

## 2020-04-26 ENCOUNTER — Other Ambulatory Visit: Payer: BC Managed Care – PPO

## 2020-04-26 ENCOUNTER — Other Ambulatory Visit: Payer: Self-pay

## 2020-04-26 ENCOUNTER — Ambulatory Visit: Payer: Self-pay | Admitting: Hematology and Oncology

## 2020-04-26 ENCOUNTER — Ambulatory Visit: Payer: Self-pay

## 2020-04-29 ENCOUNTER — Telehealth: Payer: Self-pay | Admitting: Hematology and Oncology

## 2020-04-29 DIAGNOSIS — J449 Chronic obstructive pulmonary disease, unspecified: Secondary | ICD-10-CM | POA: Diagnosis not present

## 2020-04-29 NOTE — Telephone Encounter (Signed)
Rescheduled appt from 1/18 - pt is aware of appts 1/27 and 1/28

## 2020-04-30 DIAGNOSIS — C3491 Malignant neoplasm of unspecified part of right bronchus or lung: Secondary | ICD-10-CM | POA: Diagnosis not present

## 2020-04-30 DIAGNOSIS — J449 Chronic obstructive pulmonary disease, unspecified: Secondary | ICD-10-CM | POA: Diagnosis not present

## 2020-05-02 ENCOUNTER — Encounter: Payer: Self-pay | Admitting: Hematology and Oncology

## 2020-05-02 ENCOUNTER — Other Ambulatory Visit: Payer: Self-pay

## 2020-05-02 ENCOUNTER — Inpatient Hospital Stay: Payer: BC Managed Care – PPO | Attending: Internal Medicine

## 2020-05-02 ENCOUNTER — Inpatient Hospital Stay: Payer: BC Managed Care – PPO

## 2020-05-02 ENCOUNTER — Inpatient Hospital Stay (HOSPITAL_BASED_OUTPATIENT_CLINIC_OR_DEPARTMENT_OTHER): Payer: BC Managed Care – PPO | Admitting: Hematology and Oncology

## 2020-05-02 VITALS — BP 165/92 | HR 90 | Temp 99.7°F | Resp 16 | Ht 66.0 in | Wt 149.3 lb

## 2020-05-02 DIAGNOSIS — Z87891 Personal history of nicotine dependence: Secondary | ICD-10-CM | POA: Diagnosis not present

## 2020-05-02 DIAGNOSIS — Z5111 Encounter for antineoplastic chemotherapy: Secondary | ICD-10-CM | POA: Diagnosis not present

## 2020-05-02 DIAGNOSIS — C3491 Malignant neoplasm of unspecified part of right bronchus or lung: Secondary | ICD-10-CM | POA: Insufficient documentation

## 2020-05-02 DIAGNOSIS — Z79899 Other long term (current) drug therapy: Secondary | ICD-10-CM | POA: Diagnosis not present

## 2020-05-02 DIAGNOSIS — C7931 Secondary malignant neoplasm of brain: Secondary | ICD-10-CM | POA: Diagnosis not present

## 2020-05-02 DIAGNOSIS — Z5112 Encounter for antineoplastic immunotherapy: Secondary | ICD-10-CM | POA: Diagnosis not present

## 2020-05-02 DIAGNOSIS — Z95828 Presence of other vascular implants and grafts: Secondary | ICD-10-CM

## 2020-05-02 LAB — CBC WITH DIFFERENTIAL (CANCER CENTER ONLY)
Abs Immature Granulocytes: 0.01 10*3/uL (ref 0.00–0.07)
Basophils Absolute: 0 10*3/uL (ref 0.0–0.1)
Basophils Relative: 1 %
Eosinophils Absolute: 0.3 10*3/uL (ref 0.0–0.5)
Eosinophils Relative: 6 %
HCT: 29.9 % — ABNORMAL LOW (ref 36.0–46.0)
Hemoglobin: 9.4 g/dL — ABNORMAL LOW (ref 12.0–15.0)
Immature Granulocytes: 0 %
Lymphocytes Relative: 11 %
Lymphs Abs: 0.6 10*3/uL — ABNORMAL LOW (ref 0.7–4.0)
MCH: 29.9 pg (ref 26.0–34.0)
MCHC: 31.4 g/dL (ref 30.0–36.0)
MCV: 95.2 fL (ref 80.0–100.0)
Monocytes Absolute: 0.7 10*3/uL (ref 0.1–1.0)
Monocytes Relative: 13 %
Neutro Abs: 3.6 10*3/uL (ref 1.7–7.7)
Neutrophils Relative %: 69 %
Platelet Count: 225 10*3/uL (ref 150–400)
RBC: 3.14 MIL/uL — ABNORMAL LOW (ref 3.87–5.11)
RDW: 15.8 % — ABNORMAL HIGH (ref 11.5–15.5)
WBC Count: 5.1 10*3/uL (ref 4.0–10.5)
nRBC: 0 % (ref 0.0–0.2)

## 2020-05-02 LAB — CMP (CANCER CENTER ONLY)
ALT: 9 U/L (ref 0–44)
AST: 25 U/L (ref 15–41)
Albumin: 3.3 g/dL — ABNORMAL LOW (ref 3.5–5.0)
Alkaline Phosphatase: 48 U/L (ref 38–126)
Anion gap: 9 (ref 5–15)
BUN: 7 mg/dL — ABNORMAL LOW (ref 8–23)
CO2: 28 mmol/L (ref 22–32)
Calcium: 9.1 mg/dL (ref 8.9–10.3)
Chloride: 106 mmol/L (ref 98–111)
Creatinine: 0.78 mg/dL (ref 0.44–1.00)
GFR, Estimated: >60 mL/min (ref >60)
Glucose, Bld: 82 mg/dL (ref 70–99)
Potassium: 3.5 mmol/L (ref 3.5–5.1)
Sodium: 143 mmol/L (ref 135–145)
Total Bilirubin: 0.2 mg/dL — ABNORMAL LOW (ref 0.3–1.2)
Total Protein: 6.6 g/dL (ref 6.5–8.1)

## 2020-05-02 LAB — MAGNESIUM: Magnesium: 1.8 mg/dL (ref 1.7–2.4)

## 2020-05-02 MED ORDER — SODIUM CHLORIDE 0.9% FLUSH
10.0000 mL | Freq: Once | INTRAVENOUS | Status: DC
  Filled 2020-05-02: qty 10

## 2020-05-02 NOTE — Patient Instructions (Signed)
Central Line, Adult A central line is a long, thin tube (catheter) that is put into a vein so that it goes to a large vein above your heart. It can be used to:  Give you medicine or fluids.  Give you food and nutrients.  Take blood or give you blood for testing or treatments. Types of central lines There are four main types of central lines:  Peripherally inserted central catheter (PICC) line. This type is usually put in the upper arm and goes up the arm to the heart.  Tunneled central line. This type is placed in a large vein in the neck, chest, or groin. It is tunneled under the skin and brought out through a second incision.  Non-tunneled central line. This type is used for a shorter time than other types, usually for 7 days at the most. It is inserted in the neck, chest, or groin.  Implanted port. This type can stay in place longer than other types of central lines. It is normally put in the upper chest but can also be placed in the upper arm or the belly. Surgery is needed to put it in and take it out. The type of central line you get will depend on how long you need it and your medical condition.   Tell a doctor about:  Any allergies you have.  All medicines you are taking. These include vitamins, herbs, eye drops, creams, and over-the-counter medicines.  Any problems you or family members have had with anesthetic medicines.  Any blood disorders you have.  Any surgeries you have had.  Any medical conditions you have.  Whether you are pregnant or may be pregnant. What are the risks? Generally, central lines are safe. However, problems may occur, including:  Infection.  A blood clot.  Bleeding from the place where the central line was inserted.  Getting a hole or crack in the central line. If this happens, the central line will need to be replaced.  Central line failure.  The catheter moving or coming out of place. What happens before the  procedure? Medicines  Ask your doctor about changing or stopping: ? Your normal medicines. ? Vitamins, herbs, and supplements. ? Over-the-counter medicines.  Do not take aspirin or ibuprofen unless you are told to. General instructions  Follow instructions from your doctor about eating or drinking.  For your safety, your doctor may: ? Mark the area of the procedure. ? Remove hair at the procedure site. ? Ask you to wash with a soap that kills germs.  Plan to have a responsible adult take you home from the hospital or clinic.  If you will be going home right after the procedure, plan to have a responsible adult care for you for the time you are told. This is important. What happens during the procedure?  An IV tube will be put into one of your veins.  You may be given: ? A sedative. This medicine helps you relax. ? Anesthetics. These medicines numb certain areas of your body.  Your skin will be cleaned with a germ-killing (antiseptic) solution. You may be covered with clean drapes.  Your blood pressure, heart rate, breathing rate, and blood oxygen level will be monitored during the procedure.  The central line will be put into the vein and moved through it to the correct spot. The doctor may use X-ray equipment to help guide the central line to the right place.  A bandage (dressing) will be placed over the insertion area.   The procedure may vary among doctors and hospitals. What can I expect after the procedure?  You will be monitored until you leave the hospital or clinic. This includes checking your blood pressure, heart rate, breathing rate, and blood oxygen level.  Caps may be placed on the ends of the central line tubing.  If you were given a sedative during your procedure, do not drive or use machines until your doctor says that it is safe. Follow these instructions at home: Caring for the tube  Follow instructions from your doctor about: ? Flushing the  tube. ? Cleaning the tube and the area around it.  Only use germ-free (sterile) supplies to flush. The supplies should be from your doctor, a pharmacy, or another place that your doctor recommends.  Before you flush the tube or clean the area around the tube: ? Wash your hands with soap and water for at least 20 seconds. If you cannot use soap and water, use hand sanitizer. ? Clean the central line hub with rubbing alcohol. To do this:  Scrub it using a twisting motion and rub for 10 to 15 seconds or for 30 twists. Follow the manufacturer's instructions.  Be sure you scrub the top of the hub, not just the sides. Never reuse alcohol pads.  Let the hub dry before use. Keep it from touching anything while drying.   Caring for your skin  Check the skin around the central line every day for signs of infection. Check for: ? Redness, swelling, or pain. ? Fluid or blood. ? Warmth. ? Pus or a bad smell.  Keep the area where the tube was put in clean and dry.  Change bandages only as told by your doctor.  Keep your bandage dry. If a bandage gets wet, have it changed right away. General instructions  Keep the tube clamped, unless it is being used.  If you or someone else accidentally pulls on the tube, make sure: ? The bandage is okay. ? There is no bleeding. ? The tube has not been pulled out.  Do not use scissors or sharp objects near the tube.  Do not take baths, swim, or use a hot tub until your doctor says it is okay. Ask your doctor if you may take showers. You may only be allowed to take sponge baths.  Ask your doctor what activities are safe for you. Your doctor may tell you not to lift anything or move your arm too much.  Take over-the-counter and prescription medicines only as told by your doctor.  Keep all follow-up visits. Storing and throwing away supplies  Keep your supplies in a clean, dry location.  Throw away any used syringes in a container that is only for  sharp items (sharps container). You can buy a sharps container from a pharmacy, or you can make one by using an empty hard plastic bottle with a cover.  Place any used bandages or infusion bags into a plastic bag. Throw that bag in the trash. Contact a doctor if:  You have any of these signs of infection where the tube was put in: ? Redness, swelling, or pain. ? Fluid or blood. ? Warmth. ? Pus or a bad smell. Get help right away if:  You have: ? A fever or chills. ? Shortness of breath. ? Pain in your chest. ? A fast heartbeat. ? Swelling in your neck, face, chest, or arm.  You feel dizzy or you faint.  There are red lines coming from   where the tube was put in.  The area where the tube was put in is bleeding and the bleeding will not stop.  Your tube is hard to flush.  You do not get a blood return from the tube.  The tube gets loose or comes out.  The tube has a hole or a tear.  The tube leaks. Summary  A central line is a long, thin tube (catheter) that is put in your vein. It can be used to give you medicine, food, or fluids.  Follow instructions from your doctor about flushing and cleaning the tube.  Keep the area where the tube was put in clean and dry.  Ask your doctor what activities are safe for you. This information is not intended to replace advice given to you by your health care provider. Make sure you discuss any questions you have with your health care provider. Document Revised: 11/23/2019 Document Reviewed: 11/23/2019 [employer] Patient Education  2021 [employer]  

## 2020-05-02 NOTE — Progress Notes (Signed)
[facility] [phone]   [phone]  PROGRESS NOTE  Patient Care Team: Minette Brine, FNP as PCP - General (General Practice)  Hematological/Oncological History # Metastatic Adenocarcinoma of the Lung #Brain Metastasis 1) 11/08/2019: establish care with Dr. Julien Nordmann and his PA Cassie Heilingoetter. Noted to have widely metastatic lung cancer with involvement of the brain, lymph nodes, and right lung.  2) 12/04/2019: palliative radiation to the right lung mass/cervical adenopathy 3) 9/7/02021: started therapy with Carbo/Pem/Pem 4) 9/29-10/09/2019: patient underwent SRS to the brain mets 5) 03/22/2020: repeat CT C/A/P and neck scheduled. Transfer care to Dr. Lorenso Courier.   6) 03/22/2020: CT C/A/P showed interval significant improvement in previously demonstrated extensive confluent lymphadenopathy in the superior mediastinum and right hilar regions.  Interval History:  Elizabeth Mayer 60 y.o. female with medical history significant for metastatic adenocarcinoma of the lung who presents for follow up. The patient's last visit was on 03/22/2020. In there interim she had a treatment on 04/04/2020 but had to cancel her visit earlier this month due to a pneumonia.   On exam today Elizabeth Mayer that she is making a steady recovery from her pneumonia. She notes that she is almost completely back to normal. She reports that she has not been having any issues with her chemotherapy. She denies having nausea, vomiting, or diarrhea following her treatments. She is also had no issues with weakness. She does have 2 lesions in her mouth which she describes as a "nuisance". She notes that she also has some soreness in the back lower left of her mouth where there is not a tooth. She is not sure where this is coming from. She otherwise is quite well with no other side effects as result of the chemotherapy. Her appetite has been good and her weight is been stable. A full 10 point ROS is  listed below.  MEDICAL HISTORY:  Past Medical History:  Diagnosis Date  . Anemia   . Angina 1982   related to stress  . Asthma    in the past, not now- never inhlaer use  . Complication of anesthesia    states anesthesia made her hair fall out  . Constipation   . Fatigue   . GERD (gastroesophageal reflux disease)   . Heart murmur 1970s  . Ingrown toenail   . Lung cancer ([place]) 10/2019   metastatic disease to the brain  . On home oxygen therapy    2.2 lpm, 24 hours a day  . Past heart attack 1980-1981   pt states she passed out and woke up in hospital- told she had heart attack, but then dr said he couldn't find anything wrong.  . Pneumonia     SURGICAL HISTORY: Past Surgical History:  Procedure Laterality Date  . CERVICAL DISC SURGERY  2000   Disc removed from neck   . ingrown toe nail surgery Bilateral   . IR IMAGING GUIDED PORT INSERTION  02/16/2020  . RADIOLOGY WITH ANESTHESIA N/A 12/05/2019   Procedure: MRI BRAIN WITH AND WITHOUT CONTRAST;  Surgeon: Radiologist, Medication, MD;  Location: [facility];  Service: Radiology;  Laterality: N/A;  . RADIOLOGY WITH ANESTHESIA N/A 01/02/2020   Procedure: MRI BRAIN WITH AND WITHOUT CONTRAST;  Surgeon: Radiologist, Medication, MD;  Location: [facility];  Service: Radiology;  Laterality: N/A;  . RADIOLOGY WITH ANESTHESIA N/A 02/20/2020   Procedure: MRI WITH ANESTHESIA OF BRAIN WITH AND WITHOUT CONTRAST;  Surgeon: Radiologist, Medication, MD;  Location: [facility];  Service: Radiology;  Laterality:  N/A;    SOCIAL HISTORY: Social History   Socioeconomic History  . Marital status: Married    Spouse name: Not on file  . Number of children: 0  . Years of education: Not on file  . Highest education level: Not on file  Occupational History  . Occupation: Secondary school teacher  Tobacco Use  . Smoking status: Former Smoker    Packs/day: 1.00    Years: 20.00    Pack years: 20.00    Types: Cigarettes    Start date: 15    Quit date: 2001    Years  since quitting: 21.0  . Smokeless tobacco: Never Used  Vaping Use  . Vaping Use: Never used  Substance and Sexual Activity  . Alcohol use: No  . Drug use: No  . Sexual activity: Yes    Birth control/protection: None  Other Topics Concern  . Not on file  Social History Narrative  . Not on file   Social Determinants of Health   Financial Resource Strain: High Risk  . Difficulty of Paying Living Expenses: Hard  Food Insecurity: No Food Insecurity  . Worried About Charity fundraiser in the Last Year: Never true  . Ran Out of Food in the Last Year: Never true  Transportation Needs: No Transportation Needs  . Lack of Transportation (Medical): No  . Lack of Transportation (Non-Medical): No  Physical Activity: Inactive  . Days of Exercise per Week: 0 days  . Minutes of Exercise per Session: 0 min  Stress: Not on file  Social Connections: Moderately Isolated  . Frequency of Communication with Friends and Family: Twice a week  . Frequency of Social Gatherings with Friends and Family: Twice a week  . Attends Religious Services: More than 4 times per year  . Active Member of Clubs or Organizations: No  . Attends Archivist Meetings: Not on file  . Marital Status: Separated  Intimate Partner Violence: Not on file    FAMILY HISTORY: Family History  Problem Relation Age of Onset  . Arthritis Mother   . Heart failure Father     ALLERGIES:  is allergic to pseudoephedrine, gabapentin, mobic [meloxicam], and penicillins.  MEDICATIONS:  Current Outpatient Medications  Medication Sig Dispense Refill  . acetaminophen (TYLENOL) 500 MG tablet Take 1,000 mg by mouth every 6 (six) hours as needed for moderate pain.     Marland Kitchen albuterol (VENTOLIN HFA) 108 (90 Base) MCG/ACT inhaler Inhale 2 puffs into the lungs every 6 (six) hours as needed for wheezing or shortness of breath. 18 g 6  . Artificial Saliva (BIOTENE DRY MOUTH MOISTURIZING) SOLN Use as directed 1 Dose in the mouth or throat  as needed (dry mouth).    . budesonide-formoterol (SYMBICORT) 80-4.5 MCG/ACT inhaler Inhale 2 puffs into the lungs in the morning and at bedtime. 1 each 11  . Carboxymethylcellul-Glycerin (LUBRICATING EYE DROPS OP) Place 1 drop into both eyes daily as needed (irritation).    . Cream Base (WOUND CARE) CREA Apply 1 application topically as needed (wound care).    . fexofenadine (ALLEGRA) 180 MG tablet Take 180 mg by mouth daily.    . fluticasone (FLONASE) 50 MCG/ACT nasal spray Place 2 sprays into both nostrils daily.    . folic acid (FOLVITE) 1 MG tablet Take 1 tablet (1 mg total) by mouth daily. 30 tablet 4  . guaiFENesin (MUCINEX) 600 MG 12 hr tablet Take 600 mg by mouth 2 (two) times daily.     Marland Kitchen HYDROcodone-acetaminophen (  HYCET) 7.5-325 mg/15 ml solution Take 10 mLs by mouth 2 (two) times daily. For pain from cancer and radiation reactions (Patient taking differently: Take 10 mLs by mouth 2 (two) times daily as needed for moderate pain or severe pain. For pain from cancer and radiation reactions) 473 mL 0  . levofloxacin (LEVAQUIN) 750 MG tablet Take 1 tablet (750 mg total) by mouth daily. 4 tablet 0  . lidocaine (XYLOCAINE) 2 % solution Use as directed 15 mLs in the mouth or throat as needed for mouth pain. Swallow 30 min prior to meals and at bedtime for throat/esophagus pain 250 mL 1  . lidocaine-prilocaine (EMLA) cream Apply 1 application topically as needed. (Patient taking differently: Apply 1 application topically as needed (port access).) 30 g 2  . LORazepam (ATIVAN) 0.5 MG tablet Please take 1 tablet 30 minutes before your scan as needed for anxiety (Patient not taking: Reported on 04/12/2020) 2 tablet 0  . LORazepam (ATIVAN) 2 MG tablet Take 1 tab 30 min before radiation treatment to the brain. 1 tablet 0  . Multiple Vitamin (MULTIVITAMIN WITH MINERALS) TABS tablet Take 1 tablet by mouth daily.    Marland Kitchen neomycin-polymyxin-hydrocortisone (CORTISPORIN) OTIC solution Apply 1-2 drops to toe after  soaking BID (Patient taking differently: 1-2 drops See admin instructions. Apply 1-2 drops to toe after soaking twice daily) 10 mL 1  . omeprazole (PRILOSEC) 20 MG capsule Take 1 capsule (20 mg total) by mouth daily. Take as long as you are on dexamethasone to protect from stomach irritation. (Patient not taking: No sig reported) 30 capsule 2  . ondansetron (ZOFRAN ODT) 8 MG disintegrating tablet Take 1 tablet (8 mg total) by mouth every 8 (eight) hours as needed for nausea or vomiting. (Patient not taking: Reported on 04/12/2020) 30 tablet 3  . ondansetron (ZOFRAN) 4 MG tablet Take 1 tablet (4 mg total) by mouth every 8 (eight) hours as needed for nausea or vomiting. (Patient not taking: Reported on 04/12/2020) 20 tablet 0  . orphenadrine (NORFLEX) 100 MG tablet Take 1 tablet (100 mg total) by mouth 2 (two) times daily as needed for muscle spasms. (Patient not taking: Reported on 04/12/2020) 60 tablet 1  . OXYGEN Inhale 2.2 L/min into the lungs continuous.    . potassium chloride 20 MEQ/15ML (10%) SOLN Take 15 mLs (20 mEq total) by mouth 2 (two) times daily. (Patient not taking: Reported on 04/12/2020) 210 mL 0  . prochlorperazine (COMPAZINE) 10 MG tablet Take 1 tablet (10 mg total) by mouth every 6 (six) hours as needed. (Patient taking differently: Take 10 mg by mouth every 6 (six) hours as needed for nausea or vomiting.) 30 tablet 2  . sucralfate (CARAFATE) 1 g tablet Take 1 tablet (1 g total) by mouth 4 (four) times daily -  with meals and at bedtime. (Patient not taking: Reported on 04/12/2020) 90 tablet 2  . triamcinolone (KENALOG) 0.1 % Apply topically 2 (two) times daily as needed.     No current facility-administered medications for this visit.    REVIEW OF SYSTEMS:   Constitutional: ( - ) fevers, ( - )  chills , ( - ) night sweats Eyes: ( - ) blurriness of vision, ( - ) double vision, ( - ) watery eyes Ears, nose, mouth, throat, and face: ( - ) mucositis, ( - ) sore throat Respiratory: ( - )  cough, ( - ) dyspnea, ( - ) wheezes Cardiovascular: ( - ) palpitation, ( - ) chest discomfort, ( - )  lower extremity swelling Gastrointestinal:  ( - ) nausea, ( - ) heartburn, ( - ) change in bowel habits Skin: ( - ) abnormal skin rashes Lymphatics: ( - ) new lymphadenopathy, ( - ) easy bruising Neurological: ( - ) numbness, ( - ) tingling, ( - ) new weaknesses Behavioral/Psych: ( - ) mood change, ( - ) new changes  All other systems were reviewed with the patient and are negative.  PHYSICAL EXAMINATION: ECOG PERFORMANCE STATUS: 1 - Symptomatic but completely ambulatory  Vitals:   05/02/20 1527  BP: (!) 165/92  Pulse: 90  Resp: 16  Temp: 99.7 F (37.6 C)  SpO2: 97%   Filed Weights   05/02/20 1527  Weight: 149 lb 4.8 oz (67.7 kg)    GENERAL: well appearing middle aged [place] American female. alert, no distress and comfortable SKIN: skin color, texture, turgor are normal, no rashes or significant lesions EYES: conjunctiva are pink and non-injected, sclera clear LUNGS: clear to auscultation and percussion with normal breathing effort HEART: regular rate & rhythm and no murmurs and no lower extremity edema Musculoskeletal: no cyanosis of digits and no clubbing  PSYCH: alert & oriented x 3, fluent speech NEURO: no focal motor/sensory deficits  LABORATORY DATA:  I have reviewed the data as listed CBC Latest Ref Rng & Units 05/02/2020 04/12/2020 04/03/2020  WBC 4.0 - 10.5 K/uL 5.1 2.1(L) 5.4  Hemoglobin 12.0 - 15.0 g/dL 9.4(L) 8.4(L) 8.4(L)  Hematocrit 36.0 - 46.0 % 29.9(L) 26.7(L) 26.5(L)  Platelets 150 - 400 K/uL 225 129(L) 260    CMP Latest Ref Rng & Units 04/12/2020 04/03/2020 03/22/2020  Glucose 70 - 99 mg/dL 125(H) 80 102(H)  BUN 8 - 23 mg/dL 6(L) 6(L) 6(L)  Creatinine 0.44 - 1.00 mg/dL 0.83 0.79 0.96  Sodium 135 - 145 mmol/L 142 143 143  Potassium 3.5 - 5.1 mmol/L 3.2(L) 3.2(L) 3.6  Chloride 98 - 111 mmol/L 102 104 105  CO2 22 - 32 mmol/L 28 32 27  Calcium 8.9 - 10.3  mg/dL 8.8(L) 8.8(L) 8.8(L)  Total Protein 6.5 - 8.1 g/dL - 6.6 6.2(L)  Total Bilirubin 0.3 - 1.2 mg/dL - <0.2(L) 0.2(L)  Alkaline Phos 38 - 126 U/L - 42 46  AST 15 - 41 U/L - 23 25  ALT 0 - 44 U/L - 9 14    RADIOGRAPHIC STUDIES: DG Chest 2 View  Result Date: 04/12/2020 CLINICAL DATA:  Chest pain, shortness of breath. EXAM: CHEST - 2 VIEW COMPARISON:  March 16, 2020. FINDINGS: The heart size and mediastinal contours are within normal limits. Right internal jugular Port-A-Cath is unchanged in position. No pneumothorax or pleural effusion is noted. No significant pleural effusion is noted. Mild left basilar subsegmental atelectasis is noted. Stable appearance of right hilar mass and associated atelectasis or infiltrate. The visualized skeletal structures are unremarkable. IMPRESSION: Stable appearance of right hilar mass and associated atelectasis or infiltrate. Electronically Signed   By: Marijo Conception M.D.   On: 04/12/2020 15:01   CT Angio Chest PE W and/or Wo Contrast  Result Date: 04/12/2020 CLINICAL DATA:  PE suspected, low/intermediate prob, positive D-dimer Shortness of breath and chest pain.  History of lung cancer. EXAM: CT ANGIOGRAPHY CHEST WITH CONTRAST TECHNIQUE: Multidetector CT imaging of the chest was performed using the standard protocol during bolus administration of intravenous contrast. Multiplanar CT image reconstructions and MIPs were obtained to evaluate the vascular anatomy. CONTRAST:  75mL OMNIPAQUE IOHEXOL 350 MG/ML SOLN COMPARISON:  Radiograph earlier today. Most recent chest  CT 03/22/2020 FINDINGS: Cardiovascular: Technically limited evaluation for pulmonary embolus given contrast bolus timing and significant patient motion artifact. Evaluation is diagnostic to the proximal segmental levels. No intraluminal filling defects in the pulmonary arteries to suggest pulmonary embolus. The right lower lobe pulmonary arteries are attenuated, unchanged from prior exam, related to  right hilar mass. Dilated main pulmonary artery at 3.5 cm. Atherosclerosis of the thoracic aorta without dissection. Right chest port remains in place. No significant pericardial effusion or pericardial thickening. Mediastinum/Nodes: Stable appearance of anterior paratracheal soft tissue thickening measuring 1.7 cm transverse, series 4, image 43, unchanged for same caliper placement measurement. There is slight interval increase in generalized soft tissue thickening in the upper mediastinum which is indeterminate. Previous right axillary node measuring 8 mm is unchanged, series 4, image 43. No definite new or enlarging adenopathy. Mild mid to distal esophageal thickening. No suspicious thyroid nodule. Lungs/Pleura: Small right pleural effusion is similar. Trace left pleural effusion has slightly improved. A previously demonstrated right infrahilar mass is less well-defined on the current exam, difficult to delineate from adjacent atelectasis in the right middle and lower lobe. Motion through this region further limits size evaluation. Patchy ground-glass opacities throughout the right lung are new from prior exam. There also a new ground-glass opacities at the left lung apex. Similar left lower lobe atelectasis to prior exam allowing for motion. No evidence of new or enlarging pulmonary nodule. Underlying emphysema. Upper Abdomen: Left adrenal nodule on prior exam is not included in the field of view. No acute abnormality allowing for motion. Musculoskeletal: No focal bone lesion or acute osseous abnormality. Review of the MIP images confirms the above findings. IMPRESSION: 1. No central pulmonary embolus. 2. Patchy ground-glass opacities throughout the right lung and left lung apex, new from prior exam. This may be infectious or inflammatory, including COVID-19 pneumonia. 3. Known right infrahilar mass is less well-defined on the current exam, difficult to delineate from adjacent atelectasis in the right middle and  lower lobe, but grossly stable. 4. Unchanged anterior paratracheal lymph node. Slight increase in soft tissue thickening in the upper mediastinum which is nonspecific, may be treatment related. 5. Small right and trace left pleural effusions are similar to prior exam. 6. Dilated main pulmonary artery consistent with pulmonary arterial hypertension. Aortic Atherosclerosis (ICD10-I70.0) and Emphysema (ICD10-J43.9). Electronically Signed   By: Keith Rake M.D.   On: 04/12/2020 20:56    ASSESSMENT & PLAN Elizabeth Mayer 60 y.o. female with medical history significant for metastatic adenocarcinoma of the lung who presents for follow up.  After review the labs, the records, discussion with the patient the findings are most consistent with metastatic adenocarcinoma the lung without any targetable mutations.  The patient has been started on carboplatin pemetrexed pembrolizumab every 3 weeks with Dr. Julien Nordmann, however the patient has expressed a desire to transition to another provider.  As such we were happy to take up her care here.  She already discontinued the carboplatin and is on pemetrexed/pembrolizumab therapy. We picked up her care with Cycle 6 on 04/04/2020.     After discussion with the patient and review of her blood work she appears appropriate for continued treatment at this time.  # Metastatic Adenocarcinoma of the Lung #Brain Metastasis --repeat scans showed the patient is having an appropriate response to treatment with Carbo/Pem/Pem chemotherapy.  --continue with maintenance Pem/Pem, Cycle  7 Day 1 tomorrow --no targetable mutations noted on prior workup. --continue to follow with Dr. Nelida Gores for metastatic spread  to the brain.  --can consider docetaxel/ramucirumab at time of progression.  --RTC for chemo treatment on 05/23/2020  No orders of the defined types were placed in this encounter.   All questions were answered. The patient knows to call the clinic with any problems,  questions or concerns.  A total of more than 30 minutes were spent on this encounter and over half of that time was spent on counseling and coordination of care as outlined above.   Ledell Peoples, MD Department of Hematology/Oncology [place] at [facility] Phone: [phone] Pager: [phone] Email: [email or web address]  05/02/2020 3:39 PM

## 2020-05-03 ENCOUNTER — Inpatient Hospital Stay: Payer: BC Managed Care – PPO

## 2020-05-03 ENCOUNTER — Other Ambulatory Visit: Payer: Self-pay

## 2020-05-03 VITALS — BP 159/71 | HR 94 | Temp 97.6°F | Resp 24

## 2020-05-03 DIAGNOSIS — Z79899 Other long term (current) drug therapy: Secondary | ICD-10-CM | POA: Diagnosis not present

## 2020-05-03 DIAGNOSIS — Z87891 Personal history of nicotine dependence: Secondary | ICD-10-CM | POA: Diagnosis not present

## 2020-05-03 DIAGNOSIS — C7931 Secondary malignant neoplasm of brain: Secondary | ICD-10-CM | POA: Diagnosis not present

## 2020-05-03 DIAGNOSIS — C3491 Malignant neoplasm of unspecified part of right bronchus or lung: Secondary | ICD-10-CM

## 2020-05-03 DIAGNOSIS — Z5111 Encounter for antineoplastic chemotherapy: Secondary | ICD-10-CM | POA: Diagnosis not present

## 2020-05-03 DIAGNOSIS — Z5112 Encounter for antineoplastic immunotherapy: Secondary | ICD-10-CM | POA: Diagnosis not present

## 2020-05-03 LAB — TSH: TSH: 1.035 u[IU]/mL (ref 0.308–3.960)

## 2020-05-03 MED ORDER — PROCHLORPERAZINE MALEATE 10 MG PO TABS
10.0000 mg | ORAL_TABLET | Freq: Once | ORAL | Status: AC
  Administered 2020-05-03: 10 mg via ORAL

## 2020-05-03 MED ORDER — SODIUM CHLORIDE 0.9 % IV SOLN
Freq: Once | INTRAVENOUS | Status: AC
  Filled 2020-05-03: qty 250

## 2020-05-03 MED ORDER — HEPARIN SOD (PORK) LOCK FLUSH 100 UNIT/ML IV SOLN
500.0000 [IU] | Freq: Once | INTRAVENOUS | Status: AC | PRN
  Administered 2020-05-03: 500 [IU]
  Filled 2020-05-03: qty 5

## 2020-05-03 MED ORDER — SODIUM CHLORIDE 0.9 % IV SOLN
200.0000 mg | Freq: Once | INTRAVENOUS | Status: AC
  Administered 2020-05-03: 200 mg via INTRAVENOUS
  Filled 2020-05-03: qty 8

## 2020-05-03 MED ORDER — SODIUM CHLORIDE 0.9 % IV SOLN
500.0000 mg/m2 | Freq: Once | INTRAVENOUS | Status: AC
  Administered 2020-05-03: 800 mg via INTRAVENOUS
  Filled 2020-05-03: qty 20

## 2020-05-03 MED ORDER — ACETAMINOPHEN 325 MG PO TABS
ORAL_TABLET | ORAL | Status: AC
  Filled 2020-05-03: qty 2

## 2020-05-03 MED ORDER — PROCHLORPERAZINE MALEATE 10 MG PO TABS
ORAL_TABLET | ORAL | Status: AC
  Filled 2020-05-03: qty 1

## 2020-05-03 MED ORDER — ACETAMINOPHEN 325 MG PO TABS
650.0000 mg | ORAL_TABLET | Freq: Once | ORAL | Status: AC
  Administered 2020-05-03: 650 mg via ORAL

## 2020-05-03 MED ORDER — SODIUM CHLORIDE 0.9% FLUSH
10.0000 mL | INTRAVENOUS | Status: DC | PRN
  Administered 2020-05-03: 10 mL
  Filled 2020-05-03: qty 10

## 2020-05-03 NOTE — Patient Instructions (Signed)
[facility] Discharge Instructions for Patients Receiving Chemotherapy  Today you received the following chemotherapy agents: pembrolizumab and pemetrexed.  To help prevent nausea and vomiting after your treatment, we encourage you to take your nausea medication as directed.   If you develop nausea and vomiting that is not controlled by your nausea medication, call the clinic.   BELOW ARE SYMPTOMS THAT SHOULD BE REPORTED IMMEDIATELY:  *FEVER GREATER THAN 100.5 F  *CHILLS WITH OR WITHOUT FEVER  NAUSEA AND VOMITING THAT IS NOT CONTROLLED WITH YOUR NAUSEA MEDICATION  *UNUSUAL SHORTNESS OF BREATH  *UNUSUAL BRUISING OR BLEEDING  TENDERNESS IN MOUTH AND THROAT WITH OR WITHOUT PRESENCE OF ULCERS  *URINARY PROBLEMS  *BOWEL PROBLEMS  UNUSUAL RASH Items with * indicate a potential emergency and should be followed up as soon as possible.  Feel free to call the clinic should you have any questions or concerns. The clinic phone number is [phone].  Please show the [facility] at check-in to the Emergency Department and triage nurse.

## 2020-05-07 ENCOUNTER — Encounter: Payer: Self-pay | Admitting: Nurse Practitioner

## 2020-05-07 ENCOUNTER — Other Ambulatory Visit: Payer: Self-pay

## 2020-05-07 ENCOUNTER — Ambulatory Visit (INDEPENDENT_AMBULATORY_CARE_PROVIDER_SITE_OTHER): Payer: BC Managed Care – PPO | Admitting: Nurse Practitioner

## 2020-05-07 VITALS — BP 112/70 | HR 99 | Temp 98.2°F | Ht 66.0 in | Wt 147.2 lb

## 2020-05-07 DIAGNOSIS — R7309 Other abnormal glucose: Secondary | ICD-10-CM | POA: Diagnosis not present

## 2020-05-07 DIAGNOSIS — C3491 Malignant neoplasm of unspecified part of right bronchus or lung: Secondary | ICD-10-CM | POA: Diagnosis not present

## 2020-05-07 DIAGNOSIS — J189 Pneumonia, unspecified organism: Secondary | ICD-10-CM | POA: Diagnosis not present

## 2020-05-07 DIAGNOSIS — R5383 Other fatigue: Secondary | ICD-10-CM | POA: Diagnosis not present

## 2020-05-07 DIAGNOSIS — R202 Paresthesia of skin: Secondary | ICD-10-CM

## 2020-05-07 NOTE — Progress Notes (Signed)
I,Yamilka [employer] as a Education administrator for [employer], FNP.,have documented all relevant documentation on the behalf of Minette Brine, FNP,as directed by  Minette Brine, FNP while in the presence of Minette Brine, [place]. This visit occurred during the SARS-CoV-2 public health emergency.  Safety protocols were in place, including screening questions prior to the visit, additional usage of staff PPE, and extensive cleaning of exam room while observing appropriate contact time as indicated for disinfecting solutions.  Subjective:     Patient ID: Elizabeth Mayer , female    DOB: 06-Apr-1961 , 60 y.o.   MRN: [id number]   Chief Complaint  Patient presents with  . other    Patient stated she feels like she may be allergic to her chemo therapy. She stated she has been having some bodyaches, shoulder pain, difficulty breathing, heart racing and tingling in her toes.     HPI  A few weeks ago she went to the ER for symptoms of fast heart rate, shortness of breath and dizziness and was diagnosed with pneumonia.  she was advised to get in contact with [place] due to having pneumonia. She took levaquin which cleared her pneumonia.  She had chemo last week on Friday and began having the same symptoms of heart racing, tingling sensation to toes, dizziness after she went home.  She does not feel they will stop the chemo but will see what happens.  She is having chemo every 21 days just once.  She has a little cough.      Past Medical History:  Diagnosis Date  . Anemia   . Angina 1982   related to stress  . Asthma    in the past, not now- never inhlaer use  . Complication of anesthesia    states anesthesia made her hair fall out  . Constipation   . Fatigue   . GERD (gastroesophageal reflux disease)   . Heart murmur 1970s  . Ingrown toenail   . Lung cancer ([place]) 10/2019   metastatic disease to the brain  . On home oxygen therapy    2.2 lpm, 24 hours a day  . Past heart attack 1980-1981   pt states she  passed out and woke up in hospital- told she had heart attack, but then dr said he couldn't find anything wrong.  . Pneumonia      Family History  Problem Relation Age of Onset  . Arthritis Mother   . Heart failure Father      Current Outpatient Medications:  .  acetaminophen (TYLENOL) 500 MG tablet, Take 1,000 mg by mouth every 6 (six) hours as needed for moderate pain. , Disp: , Rfl:  .  albuterol (VENTOLIN HFA) 108 (90 Base) MCG/ACT inhaler, Inhale 2 puffs into the lungs every 6 (six) hours as needed for wheezing or shortness of breath., Disp: 18 g, Rfl: 6 .  Artificial Saliva (BIOTENE DRY MOUTH MOISTURIZING) SOLN, Use as directed 1 Dose in the mouth or throat as needed (dry mouth)., Disp: , Rfl:  .  budesonide-formoterol (SYMBICORT) 80-4.5 MCG/ACT inhaler, Inhale 2 puffs into the lungs in the morning and at bedtime., Disp: 1 each, Rfl: 11 .  Carboxymethylcellul-Glycerin (LUBRICATING EYE DROPS OP), Place 1 drop into both eyes daily as needed (irritation)., Disp: , Rfl:  .  Cream Base (WOUND CARE) CREA, Apply 1 application topically as needed (wound care)., Disp: , Rfl:  .  fexofenadine (ALLEGRA) 180 MG tablet, Take 180 mg by mouth daily., Disp: , Rfl:  .  folic acid (FOLVITE) 1 MG tablet, Take 1 tablet (1 mg total) by mouth daily., Disp: 30 tablet, Rfl: 4 .  guaiFENesin (MUCINEX) 600 MG 12 hr tablet, Take 600 mg by mouth 2 (two) times daily. , Disp: , Rfl:  .  HYDROcodone-acetaminophen (HYCET) 7.5-325 mg/15 ml solution, Take 10 mLs by mouth 2 (two) times daily. For pain from cancer and radiation reactions (Patient taking differently: Take 10 mLs by mouth 2 (two) times daily as needed for moderate pain or severe pain. For pain from cancer and radiation reactions), Disp: 473 mL, Rfl: 0 .  lidocaine-prilocaine (EMLA) cream, Apply 1 application topically as needed. (Patient taking differently: Apply 1 application topically as needed (port access).), Disp: 30 g, Rfl: 2 .  Multiple Vitamin  (MULTIVITAMIN WITH MINERALS) TABS tablet, Take 1 tablet by mouth daily., Disp: , Rfl:  .  neomycin-polymyxin-hydrocortisone (CORTISPORIN) OTIC solution, Apply 1-2 drops to toe after soaking BID (Patient taking differently: 1-2 drops See admin instructions. Apply 1-2 drops to toe after soaking twice daily), Disp: 10 mL, Rfl: 1 .  prochlorperazine (COMPAZINE) 10 MG tablet, Take 1 tablet (10 mg total) by mouth every 6 (six) hours as needed. (Patient taking differently: Take 10 mg by mouth every 6 (six) hours as needed for nausea or vomiting.), Disp: 30 tablet, Rfl: 2 .  triamcinolone (KENALOG) 0.1 %, Apply topically 2 (two) times daily as needed., Disp: , Rfl:  .  lidocaine (XYLOCAINE) 2 % solution, Use as directed 15 mLs in the mouth or throat as needed for mouth pain. Swallow 30 min prior to meals and at bedtime for throat/esophagus pain (Patient not taking: Reported on 05/07/2020), Disp: 250 mL, Rfl: 1 .  LORazepam (ATIVAN) 0.5 MG tablet, Please take 1 tablet 30 minutes before your scan as needed for anxiety (Patient not taking: No sig reported), Disp: 2 tablet, Rfl: 0 .  LORazepam (ATIVAN) 2 MG tablet, Take 1 tab 30 min before radiation treatment to the brain. (Patient not taking: Reported on 05/07/2020), Disp: 1 tablet, Rfl: 0 .  OXYGEN, Inhale 3 L/min into the lungs continuous., Disp: , Rfl:  .  potassium chloride 20 MEQ/15ML (10%) SOLN, Take 15 mLs (20 mEq total) by mouth 2 (two) times daily. (Patient not taking: Reported on 04/12/2020), Disp: 210 mL, Rfl: 0 .  sucralfate (CARAFATE) 1 g tablet, Take 1 tablet (1 g total) by mouth 4 (four) times daily -  with meals and at bedtime. (Patient not taking: No sig reported), Disp: 90 tablet, Rfl: 2   Allergies  Allergen Reactions  . Pseudoephedrine Hypertension  . Gabapentin Other (See Comments)    Raise blood pressure and red rings around eyes Blood vessels popped in her eyes  . Mobic [Meloxicam] Swelling    Inflamed the area that has inflammation and  stabbing pains in the area  . Penicillins Hives     Review of Systems  Constitutional: Negative.   Respiratory: Positive for shortness of breath (on Friday after having her chemo ).   Cardiovascular: Negative.   Neurological: Negative.   Psychiatric/Behavioral: Negative.      Today's Vitals   05/07/20 1115  BP: 112/70  Pulse: 99  Temp: 98.2 F (36.8 C)  TempSrc: Oral  SpO2: 94%  Weight: 147 lb 3.2 oz (66.8 kg)  Height: 5\' 6"  (1.676 m)  PainSc: 8   PainLoc: Shoulder   Body mass index is 23.76 kg/m.   Objective:  Physical Exam Constitutional:      General: She is not  in acute distress.    Appearance: Normal appearance.  Cardiovascular:     Pulses: Normal pulses.     Heart sounds: No murmur heard. No friction rub.  Pulmonary:     Effort: Pulmonary effort is normal. No respiratory distress.     Breath sounds: Normal breath sounds.  Neurological:     General: No focal deficit present.     Mental Status: She is alert and oriented to person, place, and time.     Cranial Nerves: No cranial nerve deficit.  Psychiatric:        Mood and Affect: Mood normal.        Behavior: Behavior normal.        Thought Content: Thought content normal.        Judgment: Judgment normal.         Assessment And Plan:     1. Community acquired pneumonia, unspecified laterality  She was treated for CAP approximately 4 weeks ago and she is having similar symptoms but noticed shortness of breath after her chemo treatments  Will check a follow up CXR  I have advised her to contact oncology in reference to her concerns about the shortness of breath after chemo.   Currently she is not having any new shortness of breath but is now wearing oxygen via [place]  - DG Chest 2 View; Future  2. Abnormal glucose  Chronic, stable  Will check HgbA1c - Hemoglobin A1c - Vitamin B12  3. Tingling in extremities  May be related to her chemo treatments however will check for any metabolic  causes.  She is encouraged to take a vitamin B12 supplement to help with circulation - TSH - Vitamin B12   4. Non-small cell lung cancer, right ([place])  Continuing to see Oncology for chemo therapy    Patient was given opportunity to ask questions. Patient verbalized understanding of the plan and was able to repeat key elements of the plan. All questions were answered to their satisfaction.  Minette Brine, FNP   I, Minette Brine, FNP, have reviewed all documentation for this visit. The documentation on 06/02/20 for the exam, diagnosis, procedures, and orders are all accurate and complete.   THE PATIENT IS ENCOURAGED TO PRACTICE SOCIAL DISTANCING DUE TO THE COVID-19 PANDEMIC.

## 2020-05-08 LAB — HEMOGLOBIN A1C
Est. average glucose Bld gHb Est-mCnc: 91 mg/dL
Hgb A1c MFr Bld: 4.8 % (ref 4.8–5.6)

## 2020-05-08 LAB — TSH: TSH: 1.47 u[IU]/mL (ref 0.450–4.500)

## 2020-05-08 LAB — VITAMIN B12: Vitamin B-12: 526 pg/mL (ref 232–1245)

## 2020-05-09 ENCOUNTER — Telehealth: Payer: Self-pay | Admitting: Hematology and Oncology

## 2020-05-09 NOTE — Telephone Encounter (Signed)
Scheduled appt per 1/28 sch msg - cancelled appt for 2/9 and scheduled for 2/16 - spoke with pt and  She is aware of the change.

## 2020-05-10 ENCOUNTER — Ambulatory Visit
Admission: RE | Admit: 2020-05-10 | Discharge: 2020-05-10 | Disposition: A | Discharge status: Home / Self Care | Payer: BC Managed Care – PPO | Source: Ambulatory Visit | Attending: Nurse Practitioner | Admitting: Nurse Practitioner

## 2020-05-10 DIAGNOSIS — R0602 Shortness of breath: Secondary | ICD-10-CM | POA: Diagnosis not present

## 2020-05-10 DIAGNOSIS — J189 Pneumonia, unspecified organism: Secondary | ICD-10-CM

## 2020-05-15 ENCOUNTER — Other Ambulatory Visit: Payer: Managed Care, Other (non HMO)

## 2020-05-15 ENCOUNTER — Ambulatory Visit: Payer: Managed Care, Other (non HMO) | Admitting: Hematology and Oncology

## 2020-05-15 ENCOUNTER — Ambulatory Visit: Payer: Managed Care, Other (non HMO)

## 2020-05-22 ENCOUNTER — Inpatient Hospital Stay: Payer: BC Managed Care – PPO

## 2020-05-22 ENCOUNTER — Encounter: Payer: Self-pay | Admitting: Hematology and Oncology

## 2020-05-22 ENCOUNTER — Inpatient Hospital Stay: Payer: BC Managed Care – PPO | Attending: Internal Medicine

## 2020-05-22 ENCOUNTER — Other Ambulatory Visit: Payer: Self-pay

## 2020-05-22 ENCOUNTER — Inpatient Hospital Stay: Payer: BC Managed Care – PPO | Admitting: Hematology and Oncology

## 2020-05-22 ENCOUNTER — Telehealth (HOSPITAL_COMMUNITY): Payer: Self-pay

## 2020-05-22 VITALS — BP 149/59 | HR 85 | Temp 98.3°F | Resp 16 | Ht 66.0 in | Wt 148.3 lb

## 2020-05-22 DIAGNOSIS — Z87891 Personal history of nicotine dependence: Secondary | ICD-10-CM | POA: Insufficient documentation

## 2020-05-22 DIAGNOSIS — Z79899 Other long term (current) drug therapy: Secondary | ICD-10-CM | POA: Insufficient documentation

## 2020-05-22 DIAGNOSIS — Z5111 Encounter for antineoplastic chemotherapy: Secondary | ICD-10-CM

## 2020-05-22 DIAGNOSIS — Z923 Personal history of irradiation: Secondary | ICD-10-CM | POA: Diagnosis not present

## 2020-05-22 DIAGNOSIS — Z5112 Encounter for antineoplastic immunotherapy: Secondary | ICD-10-CM | POA: Insufficient documentation

## 2020-05-22 DIAGNOSIS — C7931 Secondary malignant neoplasm of brain: Secondary | ICD-10-CM

## 2020-05-22 DIAGNOSIS — C3491 Malignant neoplasm of unspecified part of right bronchus or lung: Secondary | ICD-10-CM | POA: Insufficient documentation

## 2020-05-22 DIAGNOSIS — Z95828 Presence of other vascular implants and grafts: Secondary | ICD-10-CM

## 2020-05-22 LAB — CMP (CANCER CENTER ONLY)
ALT: 16 U/L (ref 0–44)
AST: 26 U/L (ref 15–41)
Albumin: 3.3 g/dL — ABNORMAL LOW (ref 3.5–5.0)
Alkaline Phosphatase: 43 U/L (ref 38–126)
Anion gap: 11 (ref 5–15)
BUN: 10 mg/dL (ref 8–23)
CO2: 27 mmol/L (ref 22–32)
Calcium: 8.8 mg/dL — ABNORMAL LOW (ref 8.9–10.3)
Chloride: 106 mmol/L (ref 98–111)
Creatinine: 0.94 mg/dL (ref 0.44–1.00)
GFR, Estimated: >60 mL/min (ref >60)
Glucose, Bld: 92 mg/dL (ref 70–99)
Potassium: 3.5 mmol/L (ref 3.5–5.1)
Sodium: 144 mmol/L (ref 135–145)
Total Bilirubin: 0.3 mg/dL (ref 0.3–1.2)
Total Protein: 6.4 g/dL — ABNORMAL LOW (ref 6.5–8.1)

## 2020-05-22 LAB — CBC WITH DIFFERENTIAL (CANCER CENTER ONLY)
Abs Immature Granulocytes: 0.01 10*3/uL (ref 0.00–0.07)
Basophils Absolute: 0.1 10*3/uL (ref 0.0–0.1)
Basophils Relative: 1 %
Eosinophils Absolute: 0.2 10*3/uL (ref 0.0–0.5)
Eosinophils Relative: 5 %
HCT: 29.9 % — ABNORMAL LOW (ref 36.0–46.0)
Hemoglobin: 9.4 g/dL — ABNORMAL LOW (ref 12.0–15.0)
Immature Granulocytes: 0 %
Lymphocytes Relative: 15 %
Lymphs Abs: 0.6 10*3/uL — ABNORMAL LOW (ref 0.7–4.0)
MCH: 29.2 pg (ref 26.0–34.0)
MCHC: 31.4 g/dL (ref 30.0–36.0)
MCV: 92.9 fL (ref 80.0–100.0)
Monocytes Absolute: 0.5 10*3/uL (ref 0.1–1.0)
Monocytes Relative: 12 %
Neutro Abs: 2.8 10*3/uL (ref 1.7–7.7)
Neutrophils Relative %: 67 %
Platelet Count: 185 10*3/uL (ref 150–400)
RBC: 3.22 MIL/uL — ABNORMAL LOW (ref 3.87–5.11)
RDW: 15 % (ref 11.5–15.5)
WBC Count: 4.2 10*3/uL (ref 4.0–10.5)
nRBC: 0 % (ref 0.0–0.2)

## 2020-05-22 LAB — MAGNESIUM: Magnesium: 1.6 mg/dL — ABNORMAL LOW (ref 1.7–2.4)

## 2020-05-22 LAB — TSH: TSH: 0.882 u[IU]/mL (ref 0.308–3.960)

## 2020-05-22 MED ORDER — SODIUM CHLORIDE 0.9 % IV SOLN
500.0000 mg/m2 | Freq: Once | INTRAVENOUS | Status: AC
  Administered 2020-05-22: 800 mg via INTRAVENOUS
  Filled 2020-05-22: qty 12

## 2020-05-22 MED ORDER — HEPARIN SOD (PORK) LOCK FLUSH 100 UNIT/ML IV SOLN
500.0000 [IU] | Freq: Once | INTRAVENOUS | Status: AC | PRN
  Administered 2020-05-22: 500 [IU]
  Filled 2020-05-22: qty 5

## 2020-05-22 MED ORDER — SODIUM CHLORIDE 0.9% FLUSH
10.0000 mL | Freq: Once | INTRAVENOUS | Status: AC
  Administered 2020-05-22: 10 mL
  Filled 2020-05-22: qty 10

## 2020-05-22 MED ORDER — PROCHLORPERAZINE MALEATE 10 MG PO TABS
10.0000 mg | ORAL_TABLET | Freq: Once | ORAL | Status: AC
  Administered 2020-05-22: 10 mg via ORAL

## 2020-05-22 MED ORDER — PROCHLORPERAZINE MALEATE 10 MG PO TABS
ORAL_TABLET | ORAL | Status: AC
  Filled 2020-05-22: qty 1

## 2020-05-22 MED ORDER — SODIUM CHLORIDE 0.9% FLUSH
10.0000 mL | INTRAVENOUS | Status: DC | PRN
  Administered 2020-05-22: 10 mL
  Filled 2020-05-22: qty 10

## 2020-05-22 MED ORDER — SODIUM CHLORIDE 0.9 % IV SOLN
200.0000 mg | Freq: Once | INTRAVENOUS | Status: AC
  Administered 2020-05-22: 200 mg via INTRAVENOUS
  Filled 2020-05-22: qty 8

## 2020-05-22 MED ORDER — SODIUM CHLORIDE 0.9 % IV SOLN
Freq: Once | INTRAVENOUS | Status: AC
  Filled 2020-05-22: qty 250

## 2020-05-22 NOTE — Progress Notes (Signed)
[facility] Telephone:([phone]) [phone]   [phone]  PROGRESS NOTE  Patient Care Team: Minette Brine, FNP as PCP - General (General Practice)  Hematological/Oncological History # Metastatic Adenocarcinoma of the Lung #Brain Metastasis 1) 11/08/2019: establish care with Dr. Julien Nordmann and his PA Cassie Heilingoetter. Noted to have widely metastatic lung cancer with involvement of the brain, lymph nodes, and right lung.  2) 12/04/2019: palliative radiation to the right lung mass/cervical adenopathy 3) 9/7/02021: started therapy with Carbo/Pem/Pem 4) 9/29-10/09/2019: patient underwent SRS to the brain mets 5) 03/22/2020: repeat CT C/A/P and neck scheduled. Transfer care to Dr. Lorenso Courier.   6) 03/22/2020: CT C/A/P showed interval significant improvement in previously demonstrated extensive confluent lymphadenopathy in the superior mediastinum and right hilar regions.  Interval History:  Elizabeth Mayer 60 y.o. female with medical history significant for metastatic adenocarcinoma of the lung who presents for follow up. The patient's last visit was on 04/22/2020. In there interim she had a gap in treatment due to inclimate weather.   On exam today Mrs. Mcpeters that she has been "okay in the interim since her last visit.  She notes that she has been having some "irritating side effects" from the pembrolizumab.  She notes that she is having some occasional numbness in her fingers and toes with some occasional body aches.  She reports that she is currently taking vitamin B12 as prescribed.  Fortunately she denies any nausea, vomiting, or diarrhea.  She also endorses some occasional issues with headache and some swelling of her eyelids and the area around her eyes.  A full 10 point ROS is listed below.  MEDICAL HISTORY:  Past Medical History:  Diagnosis Date  . Anemia   . Angina 1982   related to stress  . Asthma    in the past, not now- never inhlaer use  . Complication of  anesthesia    states anesthesia made her hair fall out  . Constipation   . Fatigue   . GERD (gastroesophageal reflux disease)   . Heart murmur 1970s  . Ingrown toenail   . Lung cancer ([place]) 10/2019   metastatic disease to the brain  . On home oxygen therapy    2.2 lpm, 24 hours a day  . Past heart attack 1980-1981   pt states she passed out and woke up in hospital- told she had heart attack, but then dr said he couldn't find anything wrong.  . Pneumonia     SURGICAL HISTORY: Past Surgical History:  Procedure Laterality Date  . CERVICAL DISC SURGERY  2000   Disc removed from neck   . ingrown toe nail surgery Bilateral   . IR IMAGING GUIDED PORT INSERTION  02/16/2020  . RADIOLOGY WITH ANESTHESIA N/A 12/05/2019   Procedure: MRI BRAIN WITH AND WITHOUT CONTRAST;  Surgeon: Radiologist, Medication, MD;  Location: [facility];  Service: Radiology;  Laterality: N/A;  . RADIOLOGY WITH ANESTHESIA N/A 01/02/2020   Procedure: MRI BRAIN WITH AND WITHOUT CONTRAST;  Surgeon: Radiologist, Medication, MD;  Location: [facility];  Service: Radiology;  Laterality: N/A;  . RADIOLOGY WITH ANESTHESIA N/A 02/20/2020   Procedure: MRI WITH ANESTHESIA OF BRAIN WITH AND WITHOUT CONTRAST;  Surgeon: Radiologist, Medication, MD;  Location: [facility];  Service: Radiology;  Laterality: N/A;    SOCIAL HISTORY: Social History   Socioeconomic History  . Marital status: Married    Spouse name: Not on file  . Number of children: 0  . Years of education: Not on file  .  Highest education level: Not on file  Occupational History  . Occupation: Secondary school teacher  Tobacco Use  . Smoking status: Former Smoker    Packs/day: 1.00    Years: 20.00    Pack years: 20.00    Types: Cigarettes    Start date: 38    Quit date: 2001    Years since quitting: 21.1  . Smokeless tobacco: Never Used  Vaping Use  . Vaping Use: Never used  Substance and Sexual Activity  . Alcohol use: No  . Drug use: No  . Sexual activity: Yes    Birth  control/protection: None  Other Topics Concern  . Not on file  Social History Narrative  . Not on file   Social Determinants of Health   Financial Resource Strain: High Risk  . Difficulty of Paying Living Expenses: Hard  Food Insecurity: No Food Insecurity  . Worried About Charity fundraiser in the Last Year: Never true  . Ran Out of Food in the Last Year: Never true  Transportation Needs: No Transportation Needs  . Lack of Transportation (Medical): No  . Lack of Transportation (Non-Medical): No  Physical Activity: Inactive  . Days of Exercise per Week: 0 days  . Minutes of Exercise per Session: 0 min  Stress: Not on file  Social Connections: Moderately Isolated  . Frequency of Communication with Friends and Family: Twice a week  . Frequency of Social Gatherings with Friends and Family: Twice a week  . Attends Religious Services: More than 4 times per year  . Active Member of Clubs or Organizations: No  . Attends Archivist Meetings: Not on file  . Marital Status: Separated  Intimate Partner Violence: Not on file    FAMILY HISTORY: Family History  Problem Relation Age of Onset  . Arthritis Mother   . Heart failure Father     ALLERGIES:  is allergic to pseudoephedrine, gabapentin, mobic [meloxicam], and penicillins.  MEDICATIONS:  Current Outpatient Medications  Medication Sig Dispense Refill  . acetaminophen (TYLENOL) 500 MG tablet Take 1,000 mg by mouth every 6 (six) hours as needed for moderate pain.     Marland Kitchen albuterol (VENTOLIN HFA) 108 (90 Base) MCG/ACT inhaler Inhale 2 puffs into the lungs every 6 (six) hours as needed for wheezing or shortness of breath. 18 g 6  . Artificial Saliva (BIOTENE DRY MOUTH MOISTURIZING) SOLN Use as directed 1 Dose in the mouth or throat as needed (dry mouth).    . budesonide-formoterol (SYMBICORT) 80-4.5 MCG/ACT inhaler Inhale 2 puffs into the lungs in the morning and at bedtime. 1 each 11  . Carboxymethylcellul-Glycerin  (LUBRICATING EYE DROPS OP) Place 1 drop into both eyes daily as needed (irritation).    . Cream Base (WOUND CARE) CREA Apply 1 application topically as needed (wound care).    . fexofenadine (ALLEGRA) 180 MG tablet Take 180 mg by mouth daily.    . folic acid (FOLVITE) 1 MG tablet Take 1 tablet (1 mg total) by mouth daily. 30 tablet 4  . guaiFENesin (MUCINEX) 600 MG 12 hr tablet Take 600 mg by mouth 2 (two) times daily.     Marland Kitchen HYDROcodone-acetaminophen (HYCET) 7.5-325 mg/15 ml solution Take 10 mLs by mouth 2 (two) times daily. For pain from cancer and radiation reactions (Patient taking differently: Take 10 mLs by mouth 2 (two) times daily as needed for moderate pain or severe pain. For pain from cancer and radiation reactions) 473 mL 0  . lidocaine (XYLOCAINE) 2 % solution  Use as directed 15 mLs in the mouth or throat as needed for mouth pain. Swallow 30 min prior to meals and at bedtime for throat/esophagus pain (Patient not taking: Reported on 05/07/2020) 250 mL 1  . lidocaine-prilocaine (EMLA) cream Apply 1 application topically as needed. (Patient taking differently: Apply 1 application topically as needed (port access).) 30 g 2  . LORazepam (ATIVAN) 0.5 MG tablet Please take 1 tablet 30 minutes before your scan as needed for anxiety (Patient not taking: No sig reported) 2 tablet 0  . LORazepam (ATIVAN) 2 MG tablet Take 1 tab 30 min before radiation treatment to the brain. (Patient not taking: Reported on 05/07/2020) 1 tablet 0  . Multiple Vitamin (MULTIVITAMIN WITH MINERALS) TABS tablet Take 1 tablet by mouth daily.    Marland Kitchen neomycin-polymyxin-hydrocortisone (CORTISPORIN) OTIC solution Apply 1-2 drops to toe after soaking BID (Patient taking differently: 1-2 drops See admin instructions. Apply 1-2 drops to toe after soaking twice daily) 10 mL 1  . OXYGEN Inhale 3 L/min into the lungs continuous.    . potassium chloride 20 MEQ/15ML (10%) SOLN Take 15 mLs (20 mEq total) by mouth 2 (two) times daily. (Patient  not taking: Reported on 04/12/2020) 210 mL 0  . prochlorperazine (COMPAZINE) 10 MG tablet Take 1 tablet (10 mg total) by mouth every 6 (six) hours as needed. (Patient taking differently: Take 10 mg by mouth every 6 (six) hours as needed for nausea or vomiting.) 30 tablet 2  . sucralfate (CARAFATE) 1 g tablet Take 1 tablet (1 g total) by mouth 4 (four) times daily -  with meals and at bedtime. (Patient not taking: No sig reported) 90 tablet 2  . triamcinolone (KENALOG) 0.1 % Apply topically 2 (two) times daily as needed.     No current facility-administered medications for this visit.   Facility-Administered Medications Ordered in Other Visits  Medication Dose Route Frequency Provider Last Rate Last Admin  . heparin lock flush 100 unit/mL  500 Units Intracatheter Once PRN Curt Bears, MD      . sodium chloride flush (NS) 0.9 % injection 10 mL  10 mL Intracatheter PRN Curt Bears, MD        REVIEW OF SYSTEMS:   Constitutional: ( - ) fevers, ( - )  chills , ( - ) night sweats Eyes: ( - ) blurriness of vision, ( - ) double vision, ( - ) watery eyes Ears, nose, mouth, throat, and face: ( - ) mucositis, ( - ) sore throat Respiratory: ( - ) cough, ( - ) dyspnea, ( - ) wheezes Cardiovascular: ( - ) palpitation, ( - ) chest discomfort, ( - ) lower extremity swelling Gastrointestinal:  ( - ) nausea, ( - ) heartburn, ( - ) change in bowel habits Skin: ( - ) abnormal skin rashes Lymphatics: ( - ) new lymphadenopathy, ( - ) easy bruising Neurological: ( - ) numbness, ( - ) tingling, ( - ) new weaknesses Behavioral/Psych: ( - ) mood change, ( - ) new changes  All other systems were reviewed with the patient and are negative.  PHYSICAL EXAMINATION: ECOG PERFORMANCE STATUS: 1 - Symptomatic but completely ambulatory  Vitals:   05/22/20 1303  BP: (!) 149/59  Pulse: 85  Resp: 16  Temp: 98.3 F (36.8 C)  SpO2: 98%   Filed Weights   05/22/20 1303  Weight: 148 lb 4.8 oz (67.3 kg)     GENERAL: well appearing middle aged [place] American female. alert, no distress and comfortable  SKIN: skin color, texture, turgor are normal, no rashes or significant lesions EYES: conjunctiva are pink and non-injected, sclera clear LUNGS: clear to auscultation and percussion with normal breathing effort HEART: regular rate & rhythm and no murmurs and no lower extremity edema Musculoskeletal: no cyanosis of digits and no clubbing  PSYCH: alert & oriented x 3, fluent speech NEURO: no focal motor/sensory deficits  LABORATORY DATA:  I have reviewed the data as listed CBC Latest Ref Rng & Units 05/22/2020 05/02/2020 04/12/2020  WBC 4.0 - 10.5 K/uL 4.2 5.1 2.1(L)  Hemoglobin 12.0 - 15.0 g/dL 9.4(L) 9.4(L) 8.4(L)  Hematocrit 36.0 - 46.0 % 29.9(L) 29.9(L) 26.7(L)  Platelets 150 - 400 K/uL 185 225 129(L)    CMP Latest Ref Rng & Units 05/22/2020 05/02/2020 04/12/2020  Glucose 70 - 99 mg/dL 92 82 125(H)  BUN 8 - 23 mg/dL 10 7(L) 6(L)  Creatinine 0.44 - 1.00 mg/dL 0.94 0.78 0.83  Sodium 135 - 145 mmol/L 144 143 142  Potassium 3.5 - 5.1 mmol/L 3.5 3.5 3.2(L)  Chloride 98 - 111 mmol/L 106 106 102  CO2 22 - 32 mmol/L 27 28 28   Calcium 8.9 - 10.3 mg/dL 8.8(L) 9.1 8.8(L)  Total Protein 6.5 - 8.1 g/dL 6.4(L) 6.6 -  Total Bilirubin 0.3 - 1.2 mg/dL 0.3 0.2(L) -  Alkaline Phos 38 - 126 U/L 43 48 -  AST 15 - 41 U/L 26 25 -  ALT 0 - 44 U/L 16 9 -    RADIOGRAPHIC STUDIES: DG Chest 2 View  Result Date: 05/11/2020 CLINICAL DATA:  Follow-up pneumonia, shortness of breath EXAM: CHEST - 2 VIEW COMPARISON:  04/12/2020 FINDINGS: right hilar masslike opacity is again noted. Previously seen peripheral opacities in the right lung as well as left base have improved since prior study. Heart is normal size. Right Port-A-Cath remains in place, unchanged. No effusions or pneumothorax. IMPRESSION: Right hilar mass again noted, unchanged. Previously seen peripheral opacities have improved since prior study. Electronically  Signed   By: Rolm Baptise M.D.   On: 05/11/2020 20:21    ASSESSMENT & PLAN Elizabeth Mayer 60 y.o. female with medical history significant for metastatic adenocarcinoma of the lung who presents for follow up.  After review the labs, the records, discussion with the patient the findings are most consistent with metastatic adenocarcinoma the lung without any targetable mutations.  The patient has been started on carboplatin pemetrexed pembrolizumab every 3 weeks with Dr. Julien Nordmann, however the patient has expressed a desire to transition to another provider.  As such we were happy to take up her care here.  She already discontinued the carboplatin and is on pemetrexed/pembrolizumab therapy. We assumed her care with Cycle 6 on 04/04/2020.     After discussion with the patient and review of her blood work she appears appropriate for continued treatment at this time.  # Metastatic Adenocarcinoma of the Lung #Brain Metastasis --repeat scans showed the patient is having an appropriate response to treatment with Carbo/Pem/Pem chemotherapy.  --continue with maintenance Pem/Pem, Cycle  8 Day 1 today --no targetable mutations noted on prior workup. --continue to follow with Dr. Laqueta Due Onc for metastatic spread to the brain.  --can consider docetaxel/ramucirumab at time of progression.  --RTC in 3 weeks for clinic visit and continued chemo treatments on 06/13/2020  No orders of the defined types were placed in this encounter.   All questions were answered. The patient knows to call the clinic with any problems, questions or concerns.  A total  of more than 30 minutes were spent on this encounter and over half of that time was spent on counseling and coordination of care as outlined above.   Ledell Peoples, MD Department of Hematology/Oncology [place] at [facility] Phone: [phone] Pager: [phone] Email: [email or web address]  05/22/2020 4:10 PM

## 2020-05-22 NOTE — Patient Instructions (Signed)
[facility] Discharge Instructions for Patients Receiving Chemotherapy  Today you received the following chemotherapy agents Keytruda and Alimta  To help prevent nausea and vomiting after your treatment, we encourage you to take your nausea medication as directed.   If you develop nausea and vomiting that is not controlled by your nausea medication, call the clinic.   BELOW ARE SYMPTOMS THAT SHOULD BE REPORTED IMMEDIATELY:  *FEVER GREATER THAN 100.5 F  *CHILLS WITH OR WITHOUT FEVER  NAUSEA AND VOMITING THAT IS NOT CONTROLLED WITH YOUR NAUSEA MEDICATION  *UNUSUAL SHORTNESS OF BREATH  *UNUSUAL BRUISING OR BLEEDING  TENDERNESS IN MOUTH AND THROAT WITH OR WITHOUT PRESENCE OF ULCERS  *URINARY PROBLEMS  *BOWEL PROBLEMS  UNUSUAL RASH Items with * indicate a potential emergency and should be followed up as soon as possible.  Feel free to call the clinic should you have any questions or concerns. The clinic phone number is [phone].  Please show the [facility] at check-in to the Emergency Department and triage nurse.

## 2020-05-22 NOTE — Progress Notes (Signed)
Met with patient at registration to introduce myself as Financial Resource Specialist and to assist with personal expenses while going through treatment.  Discussed one-time $1000 [employer] grant and qualifications to assist with personal expenses while going through treatment.  Also, discussed available copay assistance for Keytruda through [employer]. Gave patient her portion of application to complete and return to me. Previously received completed provider portion. Once I receive her portion, I will fax completed application to [employer] for review and processing.  She has my card for any additional financial questions or concerns.  

## 2020-05-22 NOTE — Progress Notes (Signed)
Received signed paperwork from patient for [employer] for [employer].  Faxed completed application to [employer] for review and processing. Fax received ok per confirmation sheet.

## 2020-05-24 ENCOUNTER — Encounter: Payer: Self-pay | Admitting: Hematology and Oncology

## 2020-05-24 NOTE — Progress Notes (Signed)
Obtained information from Engineer, agricultural for Clinical Intake Worksheet for [employer] for [employer].  Faxed to [employer]. Fax received ok per confirmation sheet.

## 2020-05-27 ENCOUNTER — Telehealth (HOSPITAL_COMMUNITY): Payer: Self-pay

## 2020-05-30 DIAGNOSIS — J449 Chronic obstructive pulmonary disease, unspecified: Secondary | ICD-10-CM | POA: Diagnosis not present

## 2020-05-31 DIAGNOSIS — J449 Chronic obstructive pulmonary disease, unspecified: Secondary | ICD-10-CM | POA: Diagnosis not present

## 2020-05-31 DIAGNOSIS — C3491 Malignant neoplasm of unspecified part of right bronchus or lung: Secondary | ICD-10-CM | POA: Diagnosis not present

## 2020-06-02 ENCOUNTER — Encounter: Payer: Self-pay | Admitting: Nurse Practitioner

## 2020-06-08 ENCOUNTER — Other Ambulatory Visit (HOSPITAL_COMMUNITY)
Admission: RE | Admit: 2020-06-08 | Discharge: 2020-06-08 | Disposition: A | Discharge status: Home / Self Care | Payer: BC Managed Care – PPO | Source: Ambulatory Visit | Attending: Internal Medicine | Admitting: Internal Medicine

## 2020-06-08 DIAGNOSIS — C7801 Secondary malignant neoplasm of right lung: Secondary | ICD-10-CM | POA: Diagnosis not present

## 2020-06-08 DIAGNOSIS — C3491 Malignant neoplasm of unspecified part of right bronchus or lung: Secondary | ICD-10-CM | POA: Diagnosis not present

## 2020-06-08 DIAGNOSIS — C771 Secondary and unspecified malignant neoplasm of intrathoracic lymph nodes: Secondary | ICD-10-CM | POA: Diagnosis not present

## 2020-06-08 DIAGNOSIS — Z01812 Encounter for preprocedural laboratory examination: Secondary | ICD-10-CM | POA: Insufficient documentation

## 2020-06-08 DIAGNOSIS — Z79891 Long term (current) use of opiate analgesic: Secondary | ICD-10-CM | POA: Diagnosis not present

## 2020-06-08 DIAGNOSIS — C7931 Secondary malignant neoplasm of brain: Secondary | ICD-10-CM | POA: Diagnosis not present

## 2020-06-08 DIAGNOSIS — Z20822 Contact with and (suspected) exposure to covid-19: Secondary | ICD-10-CM | POA: Insufficient documentation

## 2020-06-08 DIAGNOSIS — Z79899 Other long term (current) drug therapy: Secondary | ICD-10-CM | POA: Diagnosis not present

## 2020-06-08 DIAGNOSIS — Z9981 Dependence on supplemental oxygen: Secondary | ICD-10-CM | POA: Diagnosis not present

## 2020-06-08 DIAGNOSIS — Z880 Allergy status to penicillin: Secondary | ICD-10-CM | POA: Diagnosis not present

## 2020-06-08 DIAGNOSIS — Z599 Problem related to housing and economic circumstances, unspecified: Secondary | ICD-10-CM | POA: Diagnosis not present

## 2020-06-08 DIAGNOSIS — Z7951 Long term (current) use of inhaled steroids: Secondary | ICD-10-CM | POA: Diagnosis not present

## 2020-06-08 DIAGNOSIS — Z87891 Personal history of nicotine dependence: Secondary | ICD-10-CM | POA: Diagnosis not present

## 2020-06-08 DIAGNOSIS — Z888 Allergy status to other drugs, medicaments and biological substances status: Secondary | ICD-10-CM | POA: Diagnosis not present

## 2020-06-08 DIAGNOSIS — Z886 Allergy status to analgesic agent status: Secondary | ICD-10-CM | POA: Diagnosis not present

## 2020-06-08 LAB — SARS CORONAVIRUS 2 (TAT 6-24 HRS): SARS Coronavirus 2: NEGATIVE

## 2020-06-10 ENCOUNTER — Ambulatory Visit: Payer: BC Managed Care – PPO | Admitting: Internal Medicine

## 2020-06-10 ENCOUNTER — Encounter (HOSPITAL_COMMUNITY): Payer: Self-pay | Admitting: *Deleted

## 2020-06-10 NOTE — Progress Notes (Signed)
Anesthesia Chart Review: Same day workup  Follows with hematology/oncology as well as radiation oncology for history of widely metastatic lung cancer.  Hematological/oncological history summarized per Dr. Libby Maw last note 05/22/2020, "1) 11/08/2019: establish care with Dr. Julien Nordmann and his PA Cassie Heilingoetter. Noted to have widely metastatic lung cancer with involvement of the brain, lymph nodes, and right lung.  2) 12/04/2019: palliative radiation to the right lung mass/cervical adenopathy 3) 9/7/02021: started therapy with Carbo/Pem/Pem 4) 9/29-10/09/2019: patient underwent SRS to the brain mets 5) 03/22/2020: repeat CT C/A/P and neck scheduled. Transfer care to Dr. Lorenso Courier.  6) 03/22/2020: CT C/A/P showed interval significant improvement in previously demonstrated extensive confluent lymphadenopathy in the superior mediastinum and right hilar regions."  Follows with pulmonology for chronic respiratory failure with hypoxia.  She uses 2 L supplemental oxygen continuously 24hrs per day.  Will need DOS labs and eval.  EKG 04/12/20: Sinus rhythm. Rate 86. Borderline right axis deviation. Low voltage, precordial leads. Anteroseptal infarct, age indeterminate. chronic T wave flattening  CHEST - 2 VIEW 05/10/20: COMPARISON:  04/12/2020  FINDINGS: right hilar masslike opacity is again noted. Previously seen peripheral opacities in the right lung as well as left base have improved since prior study. Heart is normal size. Right Port-A-Cath remains in place, unchanged. No effusions or pneumothorax.  IMPRESSION: Right hilar mass again noted, unchanged.  Previously seen peripheral opacities have improved since prior study.  CT chest abdomen pelvis 03/22/2020: IMPRESSION: 1. Interval significant improvement in previously demonstrated extensive confluent lymphadenopathy in the superior mediastinum and right hilar regions. 2. The right infrahilar mass is not easily distinguished from adjacent  atelectasis within the right middle and lower lobes, although appears smaller. 3. Small bilateral pleural effusions and left basilar atelectasis. 4. No evidence of metastatic disease within the abdomen. 5. Stable small left adrenal nodule, likely an adenoma based on stability. 6. Aortic Atherosclerosis (ICD10-I70.0) and Emphysema (ICD10-J43.9).   Elizabeth Mayer [facility] Short Stay Center/Anesthesiology Phone [phone] 06/10/2020 3:36 PM

## 2020-06-10 NOTE — Progress Notes (Signed)
Patient denies fever, cough or chest pain.  PCP - Minette Brine, FNP Cardiologist - n/a Oncology - Dr Narda Rutherford  Chest x-ray - 05/10/20 (2V) EKG - 04/12/20 Stress Test - n/a ECHO - n/a Cardiac Cath - n/a  Anesthesia review: Yes  STOP now taking any Aspirin (unless otherwise instructed by your surgeon), Aleve, Naproxen, Ibuprofen, Motrin, Advil, Goody's, BC's, all herbal medications, fish oil, and all vitamins.   Coronavirus Screening Covid test on 06/08/20 was negative.  Patient verbalized understanding of instructions that were given to them via phone.

## 2020-06-10 NOTE — Anesthesia Preprocedure Evaluation (Signed)
Anesthesia Evaluation  Patient identified by MRN, date of birth, ID band Patient awake    Reviewed: Allergy & Precautions, NPO status , Patient's Chart, lab work & pertinent test results  History of Anesthesia Complications (+) history of anesthetic complications  Airway Mallampati: II  TM Distance: >3 FB Neck ROM: Full    Dental no notable dental hx. (+) Teeth Intact   Pulmonary shortness of breath, with exertion, at rest and Long-Term Oxygen Therapy, asthma , pneumonia, resolved, COPD,  COPD inhaler and oxygen dependent, former smoker,  Non small cell Ca right lung- undergoing ChemoRx+ RT   Pulmonary exam normal breath sounds clear to auscultation + decreased breath sounds      Cardiovascular + angina Normal cardiovascular exam+ Valvular Problems/Murmurs  Rhythm:Regular Rate:Normal     Neuro/Psych  Headaches, Metastatic brain disease-undergoing ChemoRx+ RT  Neuromuscular disease    GI/Hepatic Neg liver ROS, GERD  Medicated and Controlled,  Endo/Other  negative endocrine ROS  Renal/GU negative Renal ROS  negative genitourinary   Musculoskeletal  (+) Arthritis , Osteoarthritis,    Abdominal   Peds  Hematology  (+) anemia ,   Anesthesia Other Findings   Reproductive/Obstetrics                            Anesthesia Physical Anesthesia Plan  ASA: III  Anesthesia Plan: General   Post-op Pain Management:    Induction: Intravenous  PONV Risk Score and Plan: 3 and Treatment may vary due to age or medical condition and Ondansetron  Airway Management Planned: LMA  Additional Equipment:   Intra-op Plan:   Post-operative Plan:   Informed Consent: I have reviewed the patients History and Physical, chart, labs and discussed the procedure including the risks, benefits and alternatives for the proposed anesthesia with the patient or authorized representative who has indicated his/her  understanding and acceptance.     Dental advisory given  Plan Discussed with: Anesthesiologist and CRNA  Anesthesia Plan Comments: (PAT note by Karoline Caldwell, PA-C: Follows with hematology/oncology as well as radiation oncology for history of widely metastatic lung cancer.  Hematological/oncological history summarized per Dr. Libby Maw last note 05/22/2020, "1) 11/08/2019: establish care with Dr. Julien Nordmann and his PA Cassie Heilingoetter. Noted to have widely metastatic lung cancer with involvement of the brain, lymph nodes, and right lung.  2) 12/04/2019: palliative radiation to the right lung mass/cervical adenopathy 3) 9/7/02021: started therapy with Carbo/Pem/Pem 4) 9/29-10/09/2019: patient underwent SRS to the brain mets 5) 03/22/2020: repeat CT C/A/P and neck scheduled. Transfer care to Dr. Lorenso Courier.  6) 03/22/2020: CT C/A/P showed interval significant improvement in previously demonstrated extensive confluent lymphadenopathy in the superior mediastinum and right hilar regions."  Follows with pulmonology for chronic respiratory failure with hypoxia.  She uses 2 L supplemental oxygen continuously 24hrs per day.  Will need DOS labs and eval.  EKG 04/12/20: Sinus rhythm. Rate 86. Borderline right axis deviation. Low voltage, precordial leads. Anteroseptal infarct, age indeterminate. chronic T wave flattening  CHEST - 2 VIEW 05/10/20: COMPARISON:  04/12/2020  FINDINGS: right hilar masslike opacity is again noted. Previously seen peripheral opacities in the right lung as well as left base have improved since prior study. Heart is normal size. Right Port-A-Cath remains in place, unchanged. No effusions or pneumothorax.  IMPRESSION: Right hilar mass again noted, unchanged.  Previously seen peripheral opacities have improved since prior study.  CT chest abdomen pelvis 03/22/2020: IMPRESSION: 1. Interval significant improvement in previously demonstrated  extensive confluent lymphadenopathy in the  superior mediastinum and right hilar regions. 2. The right infrahilar mass is not easily distinguished from adjacent atelectasis within the right middle and lower lobes, although appears smaller. 3. Small bilateral pleural effusions and left basilar atelectasis. 4. No evidence of metastatic disease within the abdomen. 5. Stable small left adrenal nodule, likely an adenoma based on stability. 6. Aortic Atherosclerosis (ICD10-I70.0) and Emphysema (ICD10-J43.9).  )       Anesthesia Quick Evaluation

## 2020-06-11 ENCOUNTER — Encounter (HOSPITAL_COMMUNITY): Admission: RE | Disposition: A | Discharge status: Home / Self Care | Payer: Self-pay | Source: Home / Self Care

## 2020-06-11 ENCOUNTER — Encounter (HOSPITAL_COMMUNITY): Payer: Self-pay

## 2020-06-11 ENCOUNTER — Ambulatory Visit (HOSPITAL_COMMUNITY)
Admission: RE | Admit: 2020-06-11 | Discharge: 2020-06-11 | Disposition: A | Discharge status: Home / Self Care | Payer: BC Managed Care – PPO | Attending: Internal Medicine | Admitting: Internal Medicine

## 2020-06-11 ENCOUNTER — Ambulatory Visit (HOSPITAL_COMMUNITY)
Admission: RE | Admit: 2020-06-11 | Discharge: 2020-06-11 | Disposition: A | Discharge status: Home / Self Care | Payer: BC Managed Care – PPO | Source: Ambulatory Visit | Attending: Internal Medicine | Admitting: Internal Medicine

## 2020-06-11 ENCOUNTER — Other Ambulatory Visit: Payer: Self-pay

## 2020-06-11 ENCOUNTER — Ambulatory Visit (HOSPITAL_COMMUNITY): Payer: BC Managed Care – PPO | Admitting: Certified Registered Nurse Anesthetist

## 2020-06-11 DIAGNOSIS — Z79899 Other long term (current) drug therapy: Secondary | ICD-10-CM | POA: Diagnosis not present

## 2020-06-11 DIAGNOSIS — E876 Hypokalemia: Secondary | ICD-10-CM | POA: Diagnosis not present

## 2020-06-11 DIAGNOSIS — Z599 Problem related to housing and economic circumstances, unspecified: Secondary | ICD-10-CM | POA: Diagnosis not present

## 2020-06-11 DIAGNOSIS — C719 Malignant neoplasm of brain, unspecified: Secondary | ICD-10-CM | POA: Diagnosis not present

## 2020-06-11 DIAGNOSIS — Z87891 Personal history of nicotine dependence: Secondary | ICD-10-CM | POA: Insufficient documentation

## 2020-06-11 DIAGNOSIS — Z20822 Contact with and (suspected) exposure to covid-19: Secondary | ICD-10-CM | POA: Insufficient documentation

## 2020-06-11 DIAGNOSIS — Z79891 Long term (current) use of opiate analgesic: Secondary | ICD-10-CM | POA: Diagnosis not present

## 2020-06-11 DIAGNOSIS — Z7951 Long term (current) use of inhaled steroids: Secondary | ICD-10-CM | POA: Diagnosis not present

## 2020-06-11 DIAGNOSIS — C7801 Secondary malignant neoplasm of right lung: Secondary | ICD-10-CM | POA: Insufficient documentation

## 2020-06-11 DIAGNOSIS — Z9981 Dependence on supplemental oxygen: Secondary | ICD-10-CM | POA: Insufficient documentation

## 2020-06-11 DIAGNOSIS — C3491 Malignant neoplasm of unspecified part of right bronchus or lung: Secondary | ICD-10-CM | POA: Diagnosis not present

## 2020-06-11 DIAGNOSIS — Z886 Allergy status to analgesic agent status: Secondary | ICD-10-CM | POA: Diagnosis not present

## 2020-06-11 DIAGNOSIS — G939 Disorder of brain, unspecified: Secondary | ICD-10-CM | POA: Diagnosis not present

## 2020-06-11 DIAGNOSIS — J3489 Other specified disorders of nose and nasal sinuses: Secondary | ICD-10-CM | POA: Diagnosis not present

## 2020-06-11 DIAGNOSIS — H748X2 Other specified disorders of left middle ear and mastoid: Secondary | ICD-10-CM | POA: Diagnosis not present

## 2020-06-11 DIAGNOSIS — J449 Chronic obstructive pulmonary disease, unspecified: Secondary | ICD-10-CM | POA: Diagnosis not present

## 2020-06-11 DIAGNOSIS — C7931 Secondary malignant neoplasm of brain: Secondary | ICD-10-CM | POA: Diagnosis not present

## 2020-06-11 DIAGNOSIS — Z888 Allergy status to other drugs, medicaments and biological substances status: Secondary | ICD-10-CM | POA: Insufficient documentation

## 2020-06-11 DIAGNOSIS — C771 Secondary and unspecified malignant neoplasm of intrathoracic lymph nodes: Secondary | ICD-10-CM | POA: Diagnosis not present

## 2020-06-11 DIAGNOSIS — Z880 Allergy status to penicillin: Secondary | ICD-10-CM | POA: Diagnosis not present

## 2020-06-11 DIAGNOSIS — C3401 Malignant neoplasm of right main bronchus: Secondary | ICD-10-CM | POA: Diagnosis not present

## 2020-06-11 HISTORY — DX: Headache, unspecified: R51.9

## 2020-06-11 HISTORY — PX: RADIOLOGY WITH ANESTHESIA: SHX6223

## 2020-06-11 HISTORY — DX: Dyspnea, unspecified: R06.00

## 2020-06-11 SURGERY — MRI WITH ANESTHESIA
Anesthesia: General

## 2020-06-11 MED ORDER — CHLORHEXIDINE GLUCONATE 0.12 % MT SOLN
15.0000 mL | Freq: Once | OROMUCOSAL | Status: AC
  Administered 2020-06-11: 15 mL via OROMUCOSAL
  Filled 2020-06-11: qty 15

## 2020-06-11 MED ORDER — ONDANSETRON HCL 4 MG/2ML IJ SOLN
INTRAMUSCULAR | Status: DC | PRN
  Administered 2020-06-11: 4 mg via INTRAVENOUS

## 2020-06-11 MED ORDER — LIDOCAINE 2% (20 MG/ML) 5 ML SYRINGE
INTRAMUSCULAR | Status: DC | PRN
  Administered 2020-06-11: 50 mg via INTRAVENOUS

## 2020-06-11 MED ORDER — OXYCODONE HCL 5 MG/5ML PO SOLN: 5.0000 mg | Freq: Once | ORAL | Status: DC | PRN

## 2020-06-11 MED ORDER — ROCURONIUM BROMIDE 10 MG/ML (PF) SYRINGE: PREFILLED_SYRINGE | INTRAVENOUS | Status: DC | PRN

## 2020-06-11 MED ORDER — ORAL CARE MOUTH RINSE: 15.0000 mL | Freq: Once | OROMUCOSAL | Status: AC

## 2020-06-11 MED ORDER — DEXAMETHASONE SODIUM PHOSPHATE 10 MG/ML IJ SOLN
INTRAMUSCULAR | Status: DC | PRN
  Administered 2020-06-11: 5 mg via INTRAVENOUS

## 2020-06-11 MED ORDER — PROPOFOL 10 MG/ML IV BOLUS
INTRAVENOUS | Status: DC | PRN
  Administered 2020-06-11: 110 mg via INTRAVENOUS

## 2020-06-11 MED ORDER — LACTATED RINGERS IV SOLN: INTRAVENOUS | Status: DC

## 2020-06-11 MED ORDER — FENTANYL CITRATE (PF) 100 MCG/2ML IJ SOLN: 25.0000 ug | INTRAMUSCULAR | Status: DC | PRN

## 2020-06-11 MED ORDER — OXYCODONE HCL 5 MG PO TABS: 5.0000 mg | ORAL_TABLET | Freq: Once | ORAL | Status: DC | PRN

## 2020-06-11 MED ORDER — GADOBUTROL 1 MMOL/ML IV SOLN
6.0000 mL | Freq: Once | INTRAVENOUS | Status: AC | PRN
  Administered 2020-06-11: 6 mL via INTRAVENOUS

## 2020-06-11 MED ORDER — ONDANSETRON HCL 4 MG/2ML IJ SOLN: 4.0000 mg | Freq: Once | INTRAMUSCULAR | Status: DC | PRN

## 2020-06-11 NOTE — Anesthesia Postprocedure Evaluation (Signed)
Anesthesia Post Note  Patient: Elizabeth Mayer  Procedure(s) Performed: MRI WITH ANESTHESIA OF BRAIN WITH AND WITHOUT CONTRAST (N/A )     Patient location during evaluation: PACU Anesthesia Type: General Level of consciousness: awake and alert and oriented Pain management: pain level controlled Vital Signs Assessment: post-procedure vital signs reviewed and stable Respiratory status: spontaneous breathing, nonlabored ventilation, respiratory function stable and patient connected to nasal cannula oxygen Cardiovascular status: blood pressure returned to baseline and stable Postop Assessment: no apparent nausea or vomiting Anesthetic complications: no   No complications documented.  Last Vitals:  Vitals:   06/11/20 1102 06/11/20 1117  BP: (!) 162/78 (!) 159/83  Pulse: 69 66  Resp: (!) 21 (!) 27  Temp:    SpO2: 100% 100%    Last Pain:  Vitals:   06/11/20 1117  TempSrc:   PainSc: 0-No pain                 Alys Dulak A.

## 2020-06-11 NOTE — Progress Notes (Signed)
Patient is here for an MRI procedure.  Called Dr Royce Macadamia to see if labs were needed.  No labs needed Per Dr Royce Macadamia.  Verified T/O.

## 2020-06-11 NOTE — Transfer of Care (Signed)
Immediate Anesthesia Transfer of Care Note  Patient: Danahi A Lauf  Procedure(s) Performed: MRI WITH ANESTHESIA OF BRAIN WITH AND WITHOUT CONTRAST (N/A )  Patient Location: PACU  Anesthesia Type:General  Level of Consciousness: drowsy and patient cooperative  Airway & Oxygen Therapy: Patient Spontanous Breathing and Patient connected to nasal cannula oxygen  Post-op Assessment: Report given to RN and Post -op Vital signs reviewed and stable  Post vital signs: Reviewed and stable  Last Vitals:  Vitals Value Taken Time  BP 152/79 06/11/20 1047  Temp 36.5 C 06/11/20 1047  Pulse 70 06/11/20 1051  Resp 27 06/11/20 1051  SpO2 100 % 06/11/20 1051  Vitals shown include unvalidated device data.  Last Pain:  Vitals:   06/11/20 1047  TempSrc:   PainSc: 0-No pain      Patients Stated Pain Goal: 3 ([id number])  Complications: No complications documented.

## 2020-06-11 NOTE — H&P (Signed)
Anesthesia H&P Update: History and Physical Exam reviewed; patient is OK for planned anesthetic and procedure. ? ?

## 2020-06-11 NOTE — Anesthesia Procedure Notes (Signed)
Procedure Name: LMA Insertion Date/Time: 06/11/2020 10:01 AM Performed by: Georgia Duff, CRNA Pre-anesthesia Checklist: Patient identified, Emergency Drugs available, Suction available and Patient being monitored Patient Re-evaluated:Patient Re-evaluated prior to induction Oxygen Delivery Method: Circle System Utilized Preoxygenation: Pre-oxygenation with 100% oxygen Induction Type: IV induction Ventilation: Mask ventilation without difficulty LMA: LMA inserted LMA Size: 4.0 and 3.0 Number of attempts: 1 Airway Equipment and Method: Bite block Placement Confirmation: positive ETCO2 Tube secured with: Tape Dental Injury: Teeth and Oropharynx as per pre-operative assessment

## 2020-06-12 ENCOUNTER — Encounter (HOSPITAL_COMMUNITY): Payer: Self-pay | Admitting: Radiology

## 2020-06-13 ENCOUNTER — Inpatient Hospital Stay: Payer: BC Managed Care – PPO | Attending: Internal Medicine

## 2020-06-13 ENCOUNTER — Encounter: Payer: Self-pay | Admitting: Hematology and Oncology

## 2020-06-13 ENCOUNTER — Inpatient Hospital Stay: Payer: BC Managed Care – PPO

## 2020-06-13 ENCOUNTER — Other Ambulatory Visit: Payer: Self-pay

## 2020-06-13 ENCOUNTER — Inpatient Hospital Stay: Payer: BC Managed Care – PPO | Admitting: Hematology and Oncology

## 2020-06-13 VITALS — BP 145/71 | HR 93 | Temp 98.5°F | Resp 17 | Ht 66.0 in | Wt 149.3 lb

## 2020-06-13 DIAGNOSIS — R42 Dizziness and giddiness: Secondary | ICD-10-CM | POA: Diagnosis not present

## 2020-06-13 DIAGNOSIS — R202 Paresthesia of skin: Secondary | ICD-10-CM | POA: Insufficient documentation

## 2020-06-13 DIAGNOSIS — I252 Old myocardial infarction: Secondary | ICD-10-CM | POA: Diagnosis not present

## 2020-06-13 DIAGNOSIS — Z9981 Dependence on supplemental oxygen: Secondary | ICD-10-CM | POA: Insufficient documentation

## 2020-06-13 DIAGNOSIS — R6889 Other general symptoms and signs: Secondary | ICD-10-CM | POA: Insufficient documentation

## 2020-06-13 DIAGNOSIS — K219 Gastro-esophageal reflux disease without esophagitis: Secondary | ICD-10-CM | POA: Diagnosis not present

## 2020-06-13 DIAGNOSIS — Z5112 Encounter for antineoplastic immunotherapy: Secondary | ICD-10-CM | POA: Diagnosis not present

## 2020-06-13 DIAGNOSIS — Z87891 Personal history of nicotine dependence: Secondary | ICD-10-CM | POA: Diagnosis not present

## 2020-06-13 DIAGNOSIS — C3491 Malignant neoplasm of unspecified part of right bronchus or lung: Secondary | ICD-10-CM

## 2020-06-13 DIAGNOSIS — Z79899 Other long term (current) drug therapy: Secondary | ICD-10-CM | POA: Diagnosis not present

## 2020-06-13 DIAGNOSIS — Z7951 Long term (current) use of inhaled steroids: Secondary | ICD-10-CM | POA: Diagnosis not present

## 2020-06-13 DIAGNOSIS — Z9221 Personal history of antineoplastic chemotherapy: Secondary | ICD-10-CM | POA: Diagnosis not present

## 2020-06-13 DIAGNOSIS — Z5111 Encounter for antineoplastic chemotherapy: Secondary | ICD-10-CM

## 2020-06-13 DIAGNOSIS — C7931 Secondary malignant neoplasm of brain: Secondary | ICD-10-CM

## 2020-06-13 DIAGNOSIS — Z95828 Presence of other vascular implants and grafts: Secondary | ICD-10-CM

## 2020-06-13 LAB — CBC WITH DIFFERENTIAL (CANCER CENTER ONLY)
Abs Immature Granulocytes: 0.02 10*3/uL (ref 0.00–0.07)
Basophils Absolute: 0.1 10*3/uL (ref 0.0–0.1)
Basophils Relative: 2 %
Eosinophils Absolute: 0.1 10*3/uL (ref 0.0–0.5)
Eosinophils Relative: 3 %
HCT: 31.2 % — ABNORMAL LOW (ref 36.0–46.0)
Hemoglobin: 9.8 g/dL — ABNORMAL LOW (ref 12.0–15.0)
Immature Granulocytes: 0 %
Lymphocytes Relative: 16 %
Lymphs Abs: 0.7 10*3/uL (ref 0.7–4.0)
MCH: 28.9 pg (ref 26.0–34.0)
MCHC: 31.4 g/dL (ref 30.0–36.0)
MCV: 92 fL (ref 80.0–100.0)
Monocytes Absolute: 0.5 10*3/uL (ref 0.1–1.0)
Monocytes Relative: 10 %
Neutro Abs: 3.1 10*3/uL (ref 1.7–7.7)
Neutrophils Relative %: 69 %
Platelet Count: 199 10*3/uL (ref 150–400)
RBC: 3.39 MIL/uL — ABNORMAL LOW (ref 3.87–5.11)
RDW: 16.1 % — ABNORMAL HIGH (ref 11.5–15.5)
WBC Count: 4.5 10*3/uL (ref 4.0–10.5)
nRBC: 0 % (ref 0.0–0.2)

## 2020-06-13 LAB — CMP (CANCER CENTER ONLY)
ALT: 21 U/L (ref 0–44)
AST: 27 U/L (ref 15–41)
Albumin: 3.5 g/dL (ref 3.5–5.0)
Alkaline Phosphatase: 49 U/L (ref 38–126)
Anion gap: 9 (ref 5–15)
BUN: 9 mg/dL (ref 8–23)
CO2: 29 mmol/L (ref 22–32)
Calcium: 9 mg/dL (ref 8.9–10.3)
Chloride: 107 mmol/L (ref 98–111)
Creatinine: 0.92 mg/dL (ref 0.44–1.00)
GFR, Estimated: >60 mL/min (ref >60)
Glucose, Bld: 117 mg/dL — ABNORMAL HIGH (ref 70–99)
Potassium: 3.6 mmol/L (ref 3.5–5.1)
Sodium: 145 mmol/L (ref 135–145)
Total Bilirubin: 0.2 mg/dL — ABNORMAL LOW (ref 0.3–1.2)
Total Protein: 6.5 g/dL (ref 6.5–8.1)

## 2020-06-13 LAB — TSH: TSH: 1.584 u[IU]/mL (ref 0.308–3.960)

## 2020-06-13 LAB — MAGNESIUM: Magnesium: 1.6 mg/dL — ABNORMAL LOW (ref 1.7–2.4)

## 2020-06-13 MED ORDER — HEPARIN SOD (PORK) LOCK FLUSH 100 UNIT/ML IV SOLN
500.0000 [IU] | Freq: Once | INTRAVENOUS | Status: AC | PRN
  Administered 2020-06-13: 500 [IU]
  Filled 2020-06-13: qty 5

## 2020-06-13 MED ORDER — CYANOCOBALAMIN 1000 MCG/ML IJ SOLN
INTRAMUSCULAR | Status: AC
  Filled 2020-06-13: qty 1

## 2020-06-13 MED ORDER — SODIUM CHLORIDE 0.9 % IV SOLN
900.0000 mg | Freq: Once | INTRAVENOUS | Status: AC
Start: 2020-06-13 — End: 2020-06-13
  Administered 2020-06-13: 900 mg via INTRAVENOUS
  Filled 2020-06-13: qty 20

## 2020-06-13 MED ORDER — PROCHLORPERAZINE MALEATE 10 MG PO TABS
10.0000 mg | ORAL_TABLET | Freq: Once | ORAL | Status: AC
  Administered 2020-06-13: 10 mg via ORAL

## 2020-06-13 MED ORDER — SODIUM CHLORIDE 0.9 % IV SOLN
Freq: Once | INTRAVENOUS | Status: AC
  Filled 2020-06-13: qty 250

## 2020-06-13 MED ORDER — SODIUM CHLORIDE 0.9% FLUSH
10.0000 mL | Freq: Once | INTRAVENOUS | Status: AC
  Administered 2020-06-13: 10 mL
  Filled 2020-06-13: qty 10

## 2020-06-13 MED ORDER — SODIUM CHLORIDE 0.9 % IV SOLN
200.0000 mg | Freq: Once | INTRAVENOUS | Status: AC
  Administered 2020-06-13: 200 mg via INTRAVENOUS
  Filled 2020-06-13: qty 8

## 2020-06-13 MED ORDER — CYANOCOBALAMIN 1000 MCG/ML IJ SOLN
1000.0000 ug | Freq: Once | INTRAMUSCULAR | Status: AC
  Administered 2020-06-13: 1000 ug via INTRAMUSCULAR

## 2020-06-13 MED ORDER — SODIUM CHLORIDE 0.9% FLUSH
10.0000 mL | INTRAVENOUS | Status: DC | PRN
  Administered 2020-06-13: 10 mL
  Filled 2020-06-13: qty 10

## 2020-06-13 MED ORDER — PROCHLORPERAZINE MALEATE 10 MG PO TABS
ORAL_TABLET | ORAL | Status: AC
  Filled 2020-06-13: qty 1

## 2020-06-13 NOTE — Patient Instructions (Signed)

## 2020-06-13 NOTE — Patient Instructions (Signed)
[facility] Discharge Instructions for Patients Receiving Chemotherapy  Today you received the following chemotherapy agents Keytruda and Alimta  To help prevent nausea and vomiting after your treatment, we encourage you to take your nausea medication as directed.   If you develop nausea and vomiting that is not controlled by your nausea medication, call the clinic.   BELOW ARE SYMPTOMS THAT SHOULD BE REPORTED IMMEDIATELY:  *FEVER GREATER THAN 100.5 F  *CHILLS WITH OR WITHOUT FEVER  NAUSEA AND VOMITING THAT IS NOT CONTROLLED WITH YOUR NAUSEA MEDICATION  *UNUSUAL SHORTNESS OF BREATH  *UNUSUAL BRUISING OR BLEEDING  TENDERNESS IN MOUTH AND THROAT WITH OR WITHOUT PRESENCE OF ULCERS  *URINARY PROBLEMS  *BOWEL PROBLEMS  UNUSUAL RASH Items with * indicate a potential emergency and should be followed up as soon as possible.  Feel free to call the clinic should you have any questions or concerns. The clinic phone number is [phone].  Please show the [facility] at check-in to the Emergency Department and triage nurse.

## 2020-06-13 NOTE — Progress Notes (Signed)
[place] [phone]   [phone]  PROGRESS NOTE  Patient Care Team: Minette Brine, FNP as PCP - General (General Practice)  Hematological/Oncological History # Metastatic Adenocarcinoma of the Lung #Brain Metastasis 1) 11/08/2019: establish care with Dr. Julien Nordmann and his PA Cassie Heilingoetter. Noted to have widely metastatic lung cancer with involvement of the brain, lymph nodes, and right lung.  2) 12/04/2019: palliative radiation to the right lung mass/cervical adenopathy 3) 9/7/02021: started therapy with Carbo/Pem/Pem 4) 9/29-10/09/2019: patient underwent SRS to the brain mets 5) 03/22/2020: repeat CT C/A/P and neck scheduled. Transfer care to Dr. Lorenso Courier.   6) 03/22/2020: CT C/A/P showed interval significant improvement in previously demonstrated extensive confluent lymphadenopathy in the superior mediastinum and right hilar regions.  Interval History:  Elizabeth Mayer 60 y.o. female with medical history significant for metastatic adenocarcinoma of the lung who presents for follow up. The patient's last visit was on 05/22/2020. In there interim she completed Cycle 8 of Pem/Pem maintenance.   On exam today Elizabeth Mayer notes that she feels quite well.  She reports that she does occasionally have times where she "feels cold" and has sporadic bouts of pain which come and go.  She notes that she is currently taking steroids and that is altering her sleep schedule.  She is not having any issues with nausea, vomiting, or diarrhea.  She is on 3 L of oxygen and notes that this level has been stable.  Otherwise she has not noticed any side effects as a result of her chemotherapy treatment.  She is willing and able to proceed with treatment today.  A full 10 point ROS is listed below.  MEDICAL HISTORY:  Past Medical History:  Diagnosis Date  . Anemia   . Angina 1982   related to stress  . Asthma    in the past,  no current problems  . Complication of  anesthesia    states anesthesia made her hair fall out  . Constipation   . Dyspnea    on oxygen at home - 2L via [place]  . Fatigue   . GERD (gastroesophageal reflux disease)    patient denies this dx  . Headache   . Heart murmur 1970s   no problems currently  . Ingrown toenail   . Lung cancer ([place]) 10/2019   metastatic disease to the brain  . On home oxygen therapy    2.2 lpm, 24 hours a day  . Past heart attack 1980-1981   pt states she passed out and woke up in hospital- told she had heart attack, but then dr said he couldn't find anything wrong.  . Pneumonia    x 1    SURGICAL HISTORY: Past Surgical History:  Procedure Laterality Date  . CERVICAL DISC SURGERY  2000   Disc removed from neck   . ingrown toe nail surgery Bilateral   . IR IMAGING GUIDED PORT INSERTION  02/16/2020  . MULTIPLE TOOTH EXTRACTIONS     for braces  . RADIOLOGY WITH ANESTHESIA N/A 12/05/2019   Procedure: MRI BRAIN WITH AND WITHOUT CONTRAST;  Surgeon: Radiologist, Medication, MD;  Location: [facility];  Service: Radiology;  Laterality: N/A;  . RADIOLOGY WITH ANESTHESIA N/A 01/02/2020   Procedure: MRI BRAIN WITH AND WITHOUT CONTRAST;  Surgeon: Radiologist, Medication, MD;  Location: [facility];  Service: Radiology;  Laterality: N/A;  . RADIOLOGY WITH ANESTHESIA N/A 02/20/2020   Procedure: MRI WITH ANESTHESIA OF BRAIN WITH AND WITHOUT CONTRAST;  Surgeon: Radiologist, Medication,  MD;  Location: [facility];  Service: Radiology;  Laterality: N/A;  . RADIOLOGY WITH ANESTHESIA N/A 06/11/2020   Procedure: MRI WITH ANESTHESIA OF BRAIN WITH AND WITHOUT CONTRAST;  Surgeon: Radiologist, Medication, MD;  Location: [facility];  Service: Radiology;  Laterality: N/A;  . TONSILLECTOMY      SOCIAL HISTORY: Social History   Socioeconomic History  . Marital status: Married    Spouse name: Not on file  . Number of children: 0  . Years of education: Not on file  . Highest education level: Not on file  Occupational History  . Occupation:  Secondary school teacher  Tobacco Use  . Smoking status: Former Smoker    Packs/day: 1.00    Years: 20.00    Pack years: 20.00    Types: Cigarettes    Start date: 59    Quit date: 2001    Years since quitting: 21.2  . Smokeless tobacco: Never Used  Vaping Use  . Vaping Use: Never used  Substance and Sexual Activity  . Alcohol use: No  . Drug use: No  . Sexual activity: Yes    Birth control/protection: None, Post-menopausal  Other Topics Concern  . Not on file  Social History Narrative  . Not on file   Social Determinants of Health   Financial Resource Strain: High Risk  . Difficulty of Paying Living Expenses: Hard  Food Insecurity: No Food Insecurity  . Worried About Charity fundraiser in the Last Year: Never true  . Ran Out of Food in the Last Year: Never true  Transportation Needs: No Transportation Needs  . Lack of Transportation (Medical): No  . Lack of Transportation (Non-Medical): No  Physical Activity: Inactive  . Days of Exercise per Week: 0 days  . Minutes of Exercise per Session: 0 min  Stress: Not on file  Social Connections: Moderately Isolated  . Frequency of Communication with Friends and Family: Twice a week  . Frequency of Social Gatherings with Friends and Family: Twice a week  . Attends Religious Services: More than 4 times per year  . Active Member of Clubs or Organizations: No  . Attends Archivist Meetings: Not on file  . Marital Status: Separated  Intimate Partner Violence: Not on file    FAMILY HISTORY: Family History  Problem Relation Age of Onset  . Arthritis Mother   . Heart failure Father     ALLERGIES:  is allergic to pseudoephedrine, gabapentin, mobic [meloxicam], and penicillins.  MEDICATIONS:  Current Outpatient Medications  Medication Sig Dispense Refill  . acetaminophen (TYLENOL) 500 MG tablet Take 1,000 mg by mouth every 6 (six) hours as needed for moderate pain.     Marland Kitchen albuterol (VENTOLIN HFA) 108 (90 Base) MCG/ACT  inhaler Inhale 2 puffs into the lungs every 6 (six) hours as needed for wheezing or shortness of breath. 18 g 6  . Artificial Saliva (BIOTENE DRY MOUTH MOISTURIZING) SOLN Use as directed 1 Dose in the mouth or throat as needed (dry mouth).    . budesonide-formoterol (SYMBICORT) 80-4.5 MCG/ACT inhaler Inhale 2 puffs into the lungs in the morning and at bedtime. 1 each 11  . camphor-menthol (SARNA) lotion Apply 1 application topically as needed for itching.    . Carboxymethylcellul-Glycerin (LUBRICATING EYE DROPS OP) Place 1 drop into both eyes daily as needed (irritation).    . Cream Base (WOUND CARE) CREA Apply 1 application topically as needed (irritation.).    Marland Kitchen fexofenadine (ALLEGRA) 180 MG tablet Take 180 mg by mouth  daily.    . folic acid (FOLVITE) 1 MG tablet Take 1 tablet (1 mg total) by mouth daily. 30 tablet 4  . guaiFENesin (MUCINEX) 600 MG 12 hr tablet Take 600 mg by mouth 2 (two) times daily.     Marland Kitchen HYDROcodone-acetaminophen (HYCET) 7.5-325 mg/15 ml solution Take 10 mLs by mouth 2 (two) times daily. For pain from cancer and radiation reactions (Patient taking differently: Take 10 mLs by mouth 2 (two) times daily as needed for moderate pain or severe pain. For pain from cancer and radiation reactions) 473 mL 0  . lidocaine (XYLOCAINE) 2 % solution Use as directed 15 mLs in the mouth or throat as needed for mouth pain. Swallow 30 min prior to meals and at bedtime for throat/esophagus pain (Patient not taking: No sig reported) 250 mL 1  . lidocaine-prilocaine (EMLA) cream Apply 1 application topically as needed. (Patient taking differently: Apply 1 application topically as needed (port access).) 30 g 2  . LORazepam (ATIVAN) 0.5 MG tablet Please take 1 tablet 30 minutes before your scan as needed for anxiety (Patient not taking: No sig reported) 2 tablet 0  . LORazepam (ATIVAN) 2 MG tablet Take 1 tab 30 min before radiation treatment to the brain. (Patient not taking: No sig reported) 1 tablet 0   . Multiple Vitamin (MULTIVITAMIN WITH MINERALS) TABS tablet Take 1 tablet by mouth daily.    Marland Kitchen neomycin-polymyxin-hydrocortisone (CORTISPORIN) OTIC solution Apply 1-2 drops to toe after soaking BID (Patient taking differently: 1-2 drops See admin instructions. Apply 1-2 drops to toe after soaking twice daily) 10 mL 1  . OXYGEN Inhale 3 L/min into the lungs continuous.    . potassium chloride 20 MEQ/15ML (10%) SOLN Take 15 mLs (20 mEq total) by mouth 2 (two) times daily. (Patient not taking: No sig reported) 210 mL 0  . prochlorperazine (COMPAZINE) 10 MG tablet Take 1 tablet (10 mg total) by mouth every 6 (six) hours as needed. (Patient not taking: No sig reported) 30 tablet 2  . sucralfate (CARAFATE) 1 g tablet Take 1 tablet (1 g total) by mouth 4 (four) times daily -  with meals and at bedtime. (Patient not taking: No sig reported) 90 tablet 2  . triamcinolone ointment (KENALOG) 0.1 % Apply 1 application topically in the morning and at bedtime.     No current facility-administered medications for this visit.    REVIEW OF SYSTEMS:   Constitutional: ( - ) fevers, ( - )  chills , ( - ) night sweats Eyes: ( - ) blurriness of vision, ( - ) double vision, ( - ) watery eyes Ears, nose, mouth, throat, and face: ( - ) mucositis, ( - ) sore throat Respiratory: ( - ) cough, ( - ) dyspnea, ( - ) wheezes Cardiovascular: ( - ) palpitation, ( - ) chest discomfort, ( - ) lower extremity swelling Gastrointestinal:  ( - ) nausea, ( - ) heartburn, ( - ) change in bowel habits Skin: ( - ) abnormal skin rashes Lymphatics: ( - ) new lymphadenopathy, ( - ) easy bruising Neurological: ( - ) numbness, ( - ) tingling, ( - ) new weaknesses Behavioral/Psych: ( - ) mood change, ( - ) new changes  All other systems were reviewed with the patient and are negative.  PHYSICAL EXAMINATION: ECOG PERFORMANCE STATUS: 1 - Symptomatic but completely ambulatory  Vitals:   06/13/20 1238  BP: (!) 145/71  Pulse: 93  Resp: 17   Temp: 98.5 F (36.9 C)  SpO2: 99%   Filed Weights   06/13/20 1238  Weight: 149 lb 4.8 oz (67.7 kg)    GENERAL: well appearing middle aged [place] American female. alert, no distress and comfortable SKIN: skin color, texture, turgor are normal, no rashes or significant lesions EYES: conjunctiva are pink and non-injected, sclera clear LUNGS: clear to auscultation and percussion with normal breathing effort HEART: regular rate & rhythm and no murmurs and no lower extremity edema Musculoskeletal: no cyanosis of digits and no clubbing  PSYCH: alert & oriented x 3, fluent speech NEURO: no focal motor/sensory deficits  LABORATORY DATA:  I have reviewed the data as listed CBC Latest Ref Rng & Units 06/13/2020 05/22/2020 05/02/2020  WBC 4.0 - 10.5 K/uL 4.5 4.2 5.1  Hemoglobin 12.0 - 15.0 g/dL 9.8(L) 9.4(L) 9.4(L)  Hematocrit 36.0 - 46.0 % 31.2(L) 29.9(L) 29.9(L)  Platelets 150 - 400 K/uL 199 185 225    CMP Latest Ref Rng & Units 06/13/2020 05/22/2020 05/02/2020  Glucose 70 - 99 mg/dL 117(H) 92 82  BUN 8 - 23 mg/dL 9 10 7(L)  Creatinine 0.44 - 1.00 mg/dL 0.92 0.94 0.78  Sodium 135 - 145 mmol/L 145 144 143  Potassium 3.5 - 5.1 mmol/L 3.6 3.5 3.5  Chloride 98 - 111 mmol/L 107 106 106  CO2 22 - 32 mmol/L 29 27 28   Calcium 8.9 - 10.3 mg/dL 9.0 8.8(L) 9.1  Total Protein 6.5 - 8.1 g/dL 6.5 6.4(L) 6.6  Total Bilirubin 0.3 - 1.2 mg/dL 0.2(L) 0.3 0.2(L)  Alkaline Phos 38 - 126 U/L 49 43 48  AST 15 - 41 U/L 27 26 25   ALT 0 - 44 U/L 21 16 9     RADIOGRAPHIC STUDIES: MR BRAIN W WO CONTRAST  Result Date: 06/11/2020 CLINICAL DATA:  Brain/CNS neoplasm.  Assess treatment response. EXAM: MRI HEAD WITHOUT AND WITH CONTRAST TECHNIQUE: Multiplanar, multiecho pulse sequences of the brain and surrounding structures were obtained without and with intravenous contrast. CONTRAST:  12mL GADAVIST GADOBUTROL 1 MMOL/ML IV SOLN COMPARISON:  MRI of the brain February 20, 2020 FINDINGS: Brain: No acute infarction,  hemorrhage, hydrocephalus or extra-axial collection. Today's study was performed with a different postcontrast protocol and is partially degraded by motion which may decrease the sensitivity for small enhancing lesions. Enhancing lesions are again demonstrated scattered throughout the brain parenchyma, as described below and labeled on series 10: Stable lesions 1. Left superior frontal gyrus, 9 mm (9 mm on prior), image 41; 2. Right superior frontal gyrus, 6 mm (6 mm on prior), image 40; 3. Right precentral gyrus, 4 mm (4 mm on prior), image 38; 4. Medial left parietal, 2 mm (2-3 mm on prior), image 34; 5. Right anterior temporal lobe, 5 mm (5 mm on prior), 18. Smaller lesion 1. Left superior frontal gyrus, 2 mm (3 mm on prior) image 35; 2. Right parietal lobe, 3 mm (6 mm on prior), no leptomeningeal spread, image 34. Possible new lesion 1. Right parietal lobe, 2 mm (new from prior), image 38. This lesion not well seen on sagittal and coronal images raising suspicion for artifact. Vascular: Normal flow voids. Skull and upper cervical spine: Normal marrow signal. Sinuses/Orbits: Mild mucosal thickening throughout the paranasal sinuses. The orbits are maintained. Other: Minimal left mastoid effusion. IMPRESSION: 1. Seven lesions enhancing are stable or decreased in size when compared to prior MRI. The other lesions described on prior MRI are no longer seen. 2. One possible new lesion is identified on axial postcontrast images. However, this lesion is not  identified on sagittal or coronal images and may represent artifact. Attention on follow-up suggested. Electronically Signed   By: Pedro Earls M.D.   On: 06/11/2020 15:00    ASSESSMENT & PLAN Elizabeth Mayer 60 y.o. female with medical history significant for metastatic adenocarcinoma of the lung who presents for follow up.    After review the labs, the records, discussion with the patient the findings are most consistent with metastatic  adenocarcinoma the lung without any targetable mutations.  The patient has been started on carboplatin pemetrexed pembrolizumab every 3 weeks with Dr. Julien Nordmann, however the patient has expressed a desire to transition to another provider.  As such we were happy to take up her care here.  She already discontinued the carboplatin and is on maintenance pemetrexed/pembrolizumab therapy. We assumed her care with Cycle 6 on 04/04/2020. Today is Cycle 9 Day 1.     After discussion with the patient and review of her blood work she appears appropriate for continued treatment at this time.  # Metastatic Adenocarcinoma of the Lung #Brain Metastasis --repeat scans showed the patient is having an appropriate response to treatment with Carbo/Pem/Pem chemotherapy. Next scan due early April 2022.   --continue with maintenance Pem/Pem, Cycle  9 Day 1 today --no targetable mutations noted on prior workup. --continue to follow with Dr. Laqueta Due Onc for metastatic spread to the brain.  --can consider docetaxel/ramucirumab at time of progression.  --RTC in 3 weeks for clinic visit and continued chemo treatments on 07/04/2020  No orders of the defined types were placed in this encounter.   All questions were answered. The patient knows to call the clinic with any problems, questions or concerns.  A total of more than 30 minutes were spent on this encounter and over half of that time was spent on counseling and coordination of care as outlined above.   Ledell Peoples, MD Department of Hematology/Oncology [place] [facility] Phone: [phone] Pager: [phone] Email: [email or web address]  06/17/2020 2:40 PM

## 2020-06-13 NOTE — Progress Notes (Signed)
Patient with new weight 67.7kg BSA: 1.77m2  Dose of Alimta 500 mg/m2 = 890 mg rounded for 900 mg  Provider ok to increase dose based on new weight.  Future doses modified to new BSA.  T.O. Dr Claudean Severance Aurora Mask, PharmD 06/13/20 @ [phone]

## 2020-06-17 ENCOUNTER — Telehealth: Payer: Self-pay | Admitting: Internal Medicine

## 2020-06-17 ENCOUNTER — Ambulatory Visit: Payer: BC Managed Care – PPO | Admitting: Internal Medicine

## 2020-06-17 ENCOUNTER — Inpatient Hospital Stay: Payer: BC Managed Care – PPO

## 2020-06-17 NOTE — Telephone Encounter (Signed)
R/s appt per 3/14 sch msg. Pt aware.

## 2020-06-20 ENCOUNTER — Encounter: Payer: Self-pay | Admitting: Hematology and Oncology

## 2020-06-20 ENCOUNTER — Inpatient Hospital Stay (HOSPITAL_BASED_OUTPATIENT_CLINIC_OR_DEPARTMENT_OTHER): Payer: BC Managed Care – PPO | Admitting: Internal Medicine

## 2020-06-20 ENCOUNTER — Other Ambulatory Visit: Payer: Self-pay

## 2020-06-20 VITALS — BP 138/75 | HR 85 | Temp 97.6°F | Resp 15

## 2020-06-20 DIAGNOSIS — Z87891 Personal history of nicotine dependence: Secondary | ICD-10-CM | POA: Diagnosis not present

## 2020-06-20 DIAGNOSIS — C7931 Secondary malignant neoplasm of brain: Secondary | ICD-10-CM

## 2020-06-20 DIAGNOSIS — Z7951 Long term (current) use of inhaled steroids: Secondary | ICD-10-CM | POA: Diagnosis not present

## 2020-06-20 DIAGNOSIS — R6889 Other general symptoms and signs: Secondary | ICD-10-CM | POA: Diagnosis not present

## 2020-06-20 DIAGNOSIS — I252 Old myocardial infarction: Secondary | ICD-10-CM | POA: Diagnosis not present

## 2020-06-20 DIAGNOSIS — Z79899 Other long term (current) drug therapy: Secondary | ICD-10-CM | POA: Diagnosis not present

## 2020-06-20 DIAGNOSIS — C3491 Malignant neoplasm of unspecified part of right bronchus or lung: Secondary | ICD-10-CM | POA: Diagnosis not present

## 2020-06-20 DIAGNOSIS — K219 Gastro-esophageal reflux disease without esophagitis: Secondary | ICD-10-CM | POA: Diagnosis not present

## 2020-06-20 DIAGNOSIS — R42 Dizziness and giddiness: Secondary | ICD-10-CM | POA: Diagnosis not present

## 2020-06-20 DIAGNOSIS — Z5111 Encounter for antineoplastic chemotherapy: Secondary | ICD-10-CM | POA: Diagnosis not present

## 2020-06-20 DIAGNOSIS — Z9221 Personal history of antineoplastic chemotherapy: Secondary | ICD-10-CM | POA: Diagnosis not present

## 2020-06-20 DIAGNOSIS — R202 Paresthesia of skin: Secondary | ICD-10-CM | POA: Diagnosis not present

## 2020-06-20 DIAGNOSIS — Z9981 Dependence on supplemental oxygen: Secondary | ICD-10-CM | POA: Diagnosis not present

## 2020-06-20 DIAGNOSIS — Z5112 Encounter for antineoplastic immunotherapy: Secondary | ICD-10-CM | POA: Diagnosis not present

## 2020-06-20 NOTE — Progress Notes (Signed)
Met with patient at registration to obtain income documentation for grant.  Patient approved for one-time $1000 [employer] grant to assist with personal expenses while going through treatment. Discussed in detail expenses and how they are covered. She has a copy of the approval letter and expense sheet along with the Outpatient pharmacy information.  She has my card for any additional financial questions or concerns.

## 2020-06-20 NOTE — Progress Notes (Signed)
[facility] at [address] [place], [place] [address] [phone]   Interval Evaluation  Date of Service: 06/20/20 Patient Name: Elizabeth Mayer Patient MRN: [id number] Patient DOB: [birth year] Provider: Ventura Sellers, MD  Identifying Statement:  Elizabeth Mayer is a 60 y.o. female with Brain metastases ([place]) [C79.31]   Primary Cancer:  Oncologic History: Oncology History  Non-small cell lung cancer, right ([place])  11/08/2019 Initial Diagnosis   Non-small cell lung cancer, right ([place])   12/14/2019 - 12/14/2019 Chemotherapy   The patient had cemiplimab-rwlc (LIBTAYO) 350 mg in sodium chloride 0.9 % 100 mL chemo infusion, 350 mg, Intravenous, Once, 0 of 6 cycles  for chemotherapy treatment.    12/14/2019 -  Chemotherapy    Patient is on Treatment Plan: LUNG CARBOPLATIN / PEMETREXED / PEMBROLIZUMAB Q21D INDUCTION X 4 CYCLES / MAINTENANCE PEMETREXED + PEMBROLIZUMAB       CNS Oncologic History 01/12/20: SRS to 22 targets Elizabeth Mayer)  Interval History:  Elizabeth Mayer presents today for follow up after MRI brain.  She describes no new or progressive neurologic deficits.  Continues to experience mild daily headaches, has been dosing hydrocodone daily for the past couple of weeks.  No issues with her gait, no seizures.  H+P (03/14/20) Patient presents today for follow up after recent MRI brain.  She describes no new or progressive neurologic deficits.  No seizures or headaches.  Still independent with gait and activities of daily living.  Continues on chemotherapy with Dr. Julien Nordmann.  Currently taking decadron 1mg  daily.  Medications: Current Outpatient Medications on File Prior to Visit  Medication Sig Dispense Refill  . acetaminophen (TYLENOL) 500 MG tablet Take 1,000 mg by mouth every 6 (six) hours as needed for moderate pain.     Marland Kitchen albuterol (VENTOLIN HFA) 108 (90 Base) MCG/ACT inhaler Inhale 2 puffs into the lungs every 6 (six) hours as needed for  wheezing or shortness of breath. 18 g 6  . Artificial Saliva (BIOTENE DRY MOUTH MOISTURIZING) SOLN Use as directed 1 Dose in the mouth or throat as needed (dry mouth).    . budesonide-formoterol (SYMBICORT) 80-4.5 MCG/ACT inhaler Inhale 2 puffs into the lungs in the morning and at bedtime. 1 each 11  . camphor-menthol (SARNA) lotion Apply 1 application topically as needed for itching.    . Carboxymethylcellul-Glycerin (LUBRICATING EYE DROPS OP) Place 1 drop into both eyes daily as needed (irritation).    . Cream Base (WOUND CARE) CREA Apply 1 application topically as needed (irritation.).    Marland Kitchen fexofenadine (ALLEGRA) 180 MG tablet Take 180 mg by mouth daily.    . folic acid (FOLVITE) 1 MG tablet Take 1 tablet (1 mg total) by mouth daily. 30 tablet 4  . guaiFENesin (MUCINEX) 600 MG 12 hr tablet Take 600 mg by mouth 2 (two) times daily.     Marland Kitchen HYDROcodone-acetaminophen (HYCET) 7.5-325 mg/15 ml solution Take 10 mLs by mouth 2 (two) times daily. For pain from cancer and radiation reactions (Patient taking differently: Take 10 mLs by mouth 2 (two) times daily as needed for moderate pain or severe pain. For pain from cancer and radiation reactions) 473 mL 0  . lidocaine (XYLOCAINE) 2 % solution Use as directed 15 mLs in the mouth or throat as needed for mouth pain. Swallow 30 min prior to meals and at bedtime for throat/esophagus pain (Patient not taking: No sig reported) 250 mL 1  . lidocaine-prilocaine (EMLA) cream Apply 1 application topically  as needed. (Patient taking differently: Apply 1 application topically as needed (port access).) 30 g 2  . LORazepam (ATIVAN) 0.5 MG tablet Please take 1 tablet 30 minutes before your scan as needed for anxiety (Patient not taking: No sig reported) 2 tablet 0  . LORazepam (ATIVAN) 2 MG tablet Take 1 tab 30 min before radiation treatment to the brain. (Patient not taking: No sig reported) 1 tablet 0  . Multiple Vitamin (MULTIVITAMIN WITH MINERALS) TABS tablet Take 1  tablet by mouth daily.    Marland Kitchen neomycin-polymyxin-hydrocortisone (CORTISPORIN) OTIC solution Apply 1-2 drops to toe after soaking BID (Patient taking differently: 1-2 drops See admin instructions. Apply 1-2 drops to toe after soaking twice daily) 10 mL 1  . OXYGEN Inhale 3 L/min into the lungs continuous.    . potassium chloride 20 MEQ/15ML (10%) SOLN Take 15 mLs (20 mEq total) by mouth 2 (two) times daily. (Patient not taking: No sig reported) 210 mL 0  . prochlorperazine (COMPAZINE) 10 MG tablet Take 1 tablet (10 mg total) by mouth every 6 (six) hours as needed. (Patient not taking: No sig reported) 30 tablet 2  . sucralfate (CARAFATE) 1 g tablet Take 1 tablet (1 g total) by mouth 4 (four) times daily -  with meals and at bedtime. (Patient not taking: No sig reported) 90 tablet 2  . triamcinolone ointment (KENALOG) 0.1 % Apply 1 application topically in the morning and at bedtime.     No current facility-administered medications on file prior to visit.    Allergies:  Allergies  Allergen Reactions  . Pseudoephedrine Hypertension  . Gabapentin Other (See Comments)    Raise blood pressure and red rings around eyes Blood vessels popped in her eyes  . Mobic [Meloxicam] Swelling    Inflamed the area that has inflammation and stabbing pains in the area  . Penicillins Hives   Past Medical History:  Past Medical History:  Diagnosis Date  . Anemia   . Angina 1982   related to stress  . Asthma    in the past,  no current problems  . Complication of anesthesia    states anesthesia made her hair fall out  . Constipation   . Dyspnea    on oxygen at home - 2L via [place]  . Fatigue   . GERD (gastroesophageal reflux disease)    patient denies this dx  . Headache   . Heart murmur 1970s   no problems currently  . Ingrown toenail   . Lung cancer ([place]) 10/2019   metastatic disease to the brain  . On home oxygen therapy    2.2 lpm, 24 hours a day  . Past heart attack 1980-1981   pt states she  passed out and woke up in hospital- told she had heart attack, but then dr said he couldn't find anything wrong.  . Pneumonia    x 1   Past Surgical History:  Past Surgical History:  Procedure Laterality Date  . CERVICAL DISC SURGERY  2000   Disc removed from neck   . ingrown toe nail surgery Bilateral   . IR IMAGING GUIDED PORT INSERTION  02/16/2020  . MULTIPLE TOOTH EXTRACTIONS     for braces  . RADIOLOGY WITH ANESTHESIA N/A 12/05/2019   Procedure: MRI BRAIN WITH AND WITHOUT CONTRAST;  Surgeon: Radiologist, Medication, MD;  Location: [facility];  Service: Radiology;  Laterality: N/A;  . RADIOLOGY WITH ANESTHESIA N/A 01/02/2020   Procedure: MRI BRAIN WITH AND WITHOUT CONTRAST;  Surgeon:  Radiologist, Medication, MD;  Location: [facility];  Service: Radiology;  Laterality: N/A;  . RADIOLOGY WITH ANESTHESIA N/A 02/20/2020   Procedure: MRI WITH ANESTHESIA OF BRAIN WITH AND WITHOUT CONTRAST;  Surgeon: Radiologist, Medication, MD;  Location: [facility];  Service: Radiology;  Laterality: N/A;  . RADIOLOGY WITH ANESTHESIA N/A 06/11/2020   Procedure: MRI WITH ANESTHESIA OF BRAIN WITH AND WITHOUT CONTRAST;  Surgeon: Radiologist, Medication, MD;  Location: [facility];  Service: Radiology;  Laterality: N/A;  . TONSILLECTOMY     Social History:  Social History   Socioeconomic History  . Marital status: Married    Spouse name: Not on file  . Number of children: 0  . Years of education: Not on file  . Highest education level: Not on file  Occupational History  . Occupation: Secondary school teacher  Tobacco Use  . Smoking status: Former Smoker    Packs/day: 1.00    Years: 20.00    Pack years: 20.00    Types: Cigarettes    Start date: 63    Quit date: 2001    Years since quitting: 21.2  . Smokeless tobacco: Never Used  Vaping Use  . Vaping Use: Never used  Substance and Sexual Activity  . Alcohol use: No  . Drug use: No  . Sexual activity: Yes    Birth control/protection: None, Post-menopausal  Other Topics  Concern  . Not on file  Social History Narrative  . Not on file   Social Determinants of Health   Financial Resource Strain: High Risk  . Difficulty of Paying Living Expenses: Hard  Food Insecurity: No Food Insecurity  . Worried About Charity fundraiser in the Last Year: Never true  . Ran Out of Food in the Last Year: Never true  Transportation Needs: No Transportation Needs  . Lack of Transportation (Medical): No  . Lack of Transportation (Non-Medical): No  Physical Activity: Inactive  . Days of Exercise per Week: 0 days  . Minutes of Exercise per Session: 0 min  Stress: Not on file  Social Connections: Moderately Isolated  . Frequency of Communication with Friends and Family: Twice a week  . Frequency of Social Gatherings with Friends and Family: Twice a week  . Attends Religious Services: More than 4 times per year  . Active Member of Clubs or Organizations: No  . Attends Archivist Meetings: Not on file  . Marital Status: Separated  Intimate Partner Violence: Not on file   Family History:  Family History  Problem Relation Age of Onset  . Arthritis Mother   . Heart failure Father     Review of Systems: Constitutional: Doesn't report fevers, chills or abnormal weight loss Eyes: Doesn't report blurriness of vision Ears, nose, mouth, throat, and face: Doesn't report sore throat Respiratory: Doesn't report cough, dyspnea or wheezes Cardiovascular: Doesn't report palpitation, chest discomfort  Gastrointestinal:  Doesn't report nausea, constipation, diarrhea GU: Doesn't report incontinence Skin: Doesn't report skin rashes Neurological: Per HPI Musculoskeletal: Doesn't report joint pain Behavioral/Psych: Doesn't report anxiety  Physical Exam: Vitals:   06/20/20 1436  BP: 138/75  Pulse: 85  Resp: 15  Temp: 97.6 F (36.4 C)  SpO2: 100%   KPS: 90. General: With supplemental O2 Head: Normal EENT: No conjunctival injection or scleral icterus.  Lungs:  Resp effort normal Cardiac: Regular rate Abdomen: Non-distended abdomen Skin: No rashes cyanosis or petechiae. Extremities: No clubbing or edema  Neurologic Exam: Mental Status: Awake, alert, attentive to examiner. Oriented to self and environment.  Language is fluent with intact comprehension.  Cranial Nerves: Visual acuity is grossly normal. Visual fields are full. Extra-ocular movements intact. No ptosis. Face is symmetric Motor: Tone and bulk are normal. Power is full in both arms and legs. Reflexes are symmetric, no pathologic reflexes present.  Sensory: Intact to light touch Gait: Normal.   Labs: I have reviewed the data as listed    Component Value Date/Time   NA 145 06/13/2020 1221   NA 139 07/31/2019 1518   K 3.6 06/13/2020 1221   CL 107 06/13/2020 1221   CO2 29 06/13/2020 1221   GLUCOSE 117 (H) 06/13/2020 1221   BUN 9 06/13/2020 1221   BUN 8 07/31/2019 1518   CREATININE 0.92 06/13/2020 1221   CALCIUM 9.0 06/13/2020 1221   PROT 6.5 06/13/2020 1221   PROT 6.4 01/19/2019 1129   ALBUMIN 3.5 06/13/2020 1221   ALBUMIN 4.0 01/19/2019 1129   AST 27 06/13/2020 1221   ALT 21 06/13/2020 1221   ALKPHOS 49 06/13/2020 1221   BILITOT 0.2 (L) 06/13/2020 1221   GFRNONAA >60 06/13/2020 1221   GFRAA >60 01/08/2020 1342   GFRAA >60 01/04/2020 0907   Lab Results  Component Value Date   WBC 4.5 06/13/2020   NEUTROABS 3.1 06/13/2020   HGB 9.8 (L) 06/13/2020   HCT 31.2 (L) 06/13/2020   MCV 92.0 06/13/2020   PLT 199 06/13/2020    Imaging:  MR BRAIN W WO CONTRAST  Result Date: 06/11/2020 CLINICAL DATA:  Brain/CNS neoplasm.  Assess treatment response. EXAM: MRI HEAD WITHOUT AND WITH CONTRAST TECHNIQUE: Multiplanar, multiecho pulse sequences of the brain and surrounding structures were obtained without and with intravenous contrast. CONTRAST:  41mL GADAVIST GADOBUTROL 1 MMOL/ML IV SOLN COMPARISON:  MRI of the brain February 20, 2020 FINDINGS: Brain: No acute infarction, hemorrhage,  hydrocephalus or extra-axial collection. Today's study was performed with a different postcontrast protocol and is partially degraded by motion which may decrease the sensitivity for small enhancing lesions. Enhancing lesions are again demonstrated scattered throughout the brain parenchyma, as described below and labeled on series 10: Stable lesions 1. Left superior frontal gyrus, 9 mm (9 mm on prior), image 41; 2. Right superior frontal gyrus, 6 mm (6 mm on prior), image 40; 3. Right precentral gyrus, 4 mm (4 mm on prior), image 38; 4. Medial left parietal, 2 mm (2-3 mm on prior), image 34; 5. Right anterior temporal lobe, 5 mm (5 mm on prior), 18. Smaller lesion 1. Left superior frontal gyrus, 2 mm (3 mm on prior) image 35; 2. Right parietal lobe, 3 mm (6 mm on prior), no leptomeningeal spread, image 34. Possible new lesion 1. Right parietal lobe, 2 mm (new from prior), image 38. This lesion not well seen on sagittal and coronal images raising suspicion for artifact. Vascular: Normal flow voids. Skull and upper cervical spine: Normal marrow signal. Sinuses/Orbits: Mild mucosal thickening throughout the paranasal sinuses. The orbits are maintained. Other: Minimal left mastoid effusion. IMPRESSION: 1. Seven lesions enhancing are stable or decreased in size when compared to prior MRI. The other lesions described on prior MRI are no longer seen. 2. One possible new lesion is identified on axial postcontrast images. However, this lesion is not identified on sagittal or coronal images and may represent artifact. Attention on follow-up suggested. Electronically Signed   By: Pedro Earls M.D.   On: 06/11/2020 15:00    [facility] Clinician Interpretation: I have personally reviewed the radiological images as listed.  My interpretation,  in the context of the patient's clinical presentation, is treatment effect vs true progression   Assessment/Plan Brain metastases ([place]) [C79.31]  Elizabeth Mayer is  clinically stable today.  Brain MRI demonstrates stable disease, apart from tiny enhancing lesion in R parietal lobe which could represent new metastasis vs imaging artifact.  This lesion was reviewed and discussed at brain/spine tumor board this week.  Recommend careful imaging follow up only, at this time.  For headaches, counseled on proper use of analgesia, also sleep hygeine and stress management.  If headaches worsen we can consider oral headache prophylaxis.  We appreciate the opportunity to participate in the care of Elizabeth Mayer.   We ask that Elizabeth Mayer return to clinic in 3 months following next brain MRI, or sooner as needed.  All questions were answered. The patient knows to call the clinic with any problems, questions or concerns. No barriers to learning were detected.  The total time spent in the encounter was 30 minutes and more than 50% was on counseling and review of test results   Ventura Sellers, MD Medical Director of Neuro-Oncology [facility] at [place] 06/20/20 9:44 AM

## 2020-06-21 ENCOUNTER — Other Ambulatory Visit: Payer: Self-pay | Admitting: Radiation Therapy

## 2020-06-27 DIAGNOSIS — J449 Chronic obstructive pulmonary disease, unspecified: Secondary | ICD-10-CM | POA: Diagnosis not present

## 2020-06-28 DIAGNOSIS — C3491 Malignant neoplasm of unspecified part of right bronchus or lung: Secondary | ICD-10-CM | POA: Diagnosis not present

## 2020-06-28 DIAGNOSIS — J449 Chronic obstructive pulmonary disease, unspecified: Secondary | ICD-10-CM | POA: Diagnosis not present

## 2020-07-04 ENCOUNTER — Other Ambulatory Visit: Payer: BC Managed Care – PPO

## 2020-07-04 ENCOUNTER — Inpatient Hospital Stay: Payer: BC Managed Care – PPO

## 2020-07-04 ENCOUNTER — Ambulatory Visit: Payer: BC Managed Care – PPO | Admitting: Hematology and Oncology

## 2020-07-04 ENCOUNTER — Other Ambulatory Visit: Payer: Self-pay | Admitting: *Deleted

## 2020-07-04 ENCOUNTER — Encounter: Payer: Self-pay | Admitting: Hematology and Oncology

## 2020-07-04 ENCOUNTER — Other Ambulatory Visit: Payer: Self-pay

## 2020-07-04 ENCOUNTER — Inpatient Hospital Stay (HOSPITAL_BASED_OUTPATIENT_CLINIC_OR_DEPARTMENT_OTHER): Payer: BC Managed Care – PPO | Admitting: Hematology and Oncology

## 2020-07-04 VITALS — BP 157/77 | HR 84 | Temp 98.5°F | Resp 18 | Wt 148.6 lb

## 2020-07-04 DIAGNOSIS — Z9221 Personal history of antineoplastic chemotherapy: Secondary | ICD-10-CM | POA: Diagnosis not present

## 2020-07-04 DIAGNOSIS — Z95828 Presence of other vascular implants and grafts: Secondary | ICD-10-CM

## 2020-07-04 DIAGNOSIS — Z5112 Encounter for antineoplastic immunotherapy: Secondary | ICD-10-CM

## 2020-07-04 DIAGNOSIS — C3491 Malignant neoplasm of unspecified part of right bronchus or lung: Secondary | ICD-10-CM

## 2020-07-04 DIAGNOSIS — R202 Paresthesia of skin: Secondary | ICD-10-CM | POA: Diagnosis not present

## 2020-07-04 DIAGNOSIS — Z7951 Long term (current) use of inhaled steroids: Secondary | ICD-10-CM | POA: Diagnosis not present

## 2020-07-04 DIAGNOSIS — C7931 Secondary malignant neoplasm of brain: Secondary | ICD-10-CM

## 2020-07-04 DIAGNOSIS — Z87891 Personal history of nicotine dependence: Secondary | ICD-10-CM | POA: Diagnosis not present

## 2020-07-04 DIAGNOSIS — R42 Dizziness and giddiness: Secondary | ICD-10-CM | POA: Diagnosis not present

## 2020-07-04 DIAGNOSIS — R6889 Other general symptoms and signs: Secondary | ICD-10-CM | POA: Diagnosis not present

## 2020-07-04 DIAGNOSIS — K219 Gastro-esophageal reflux disease without esophagitis: Secondary | ICD-10-CM | POA: Diagnosis not present

## 2020-07-04 DIAGNOSIS — Z5111 Encounter for antineoplastic chemotherapy: Secondary | ICD-10-CM | POA: Diagnosis not present

## 2020-07-04 DIAGNOSIS — Z79899 Other long term (current) drug therapy: Secondary | ICD-10-CM | POA: Diagnosis not present

## 2020-07-04 DIAGNOSIS — I252 Old myocardial infarction: Secondary | ICD-10-CM | POA: Diagnosis not present

## 2020-07-04 DIAGNOSIS — Z9981 Dependence on supplemental oxygen: Secondary | ICD-10-CM | POA: Diagnosis not present

## 2020-07-04 LAB — CBC WITH DIFFERENTIAL (CANCER CENTER ONLY)
Abs Immature Granulocytes: 0.01 10*3/uL (ref 0.00–0.07)
Basophils Absolute: 0 10*3/uL (ref 0.0–0.1)
Basophils Relative: 1 %
Eosinophils Absolute: 0.2 10*3/uL (ref 0.0–0.5)
Eosinophils Relative: 4 %
HCT: 30.3 % — ABNORMAL LOW (ref 36.0–46.0)
Hemoglobin: 9.7 g/dL — ABNORMAL LOW (ref 12.0–15.0)
Immature Granulocytes: 0 %
Lymphocytes Relative: 17 %
Lymphs Abs: 0.7 10*3/uL (ref 0.7–4.0)
MCH: 29.6 pg (ref 26.0–34.0)
MCHC: 32 g/dL (ref 30.0–36.0)
MCV: 92.4 fL (ref 80.0–100.0)
Monocytes Absolute: 0.6 10*3/uL (ref 0.1–1.0)
Monocytes Relative: 13 %
Neutro Abs: 2.7 10*3/uL (ref 1.7–7.7)
Neutrophils Relative %: 65 %
Platelet Count: 202 10*3/uL (ref 150–400)
RBC: 3.28 MIL/uL — ABNORMAL LOW (ref 3.87–5.11)
RDW: 15.7 % — ABNORMAL HIGH (ref 11.5–15.5)
WBC Count: 4.2 10*3/uL (ref 4.0–10.5)
nRBC: 0 % (ref 0.0–0.2)

## 2020-07-04 LAB — MAGNESIUM: Magnesium: 1.7 mg/dL (ref 1.7–2.4)

## 2020-07-04 LAB — CMP (CANCER CENTER ONLY)
ALT: 17 U/L (ref 0–44)
AST: 25 U/L (ref 15–41)
Albumin: 3.4 g/dL — ABNORMAL LOW (ref 3.5–5.0)
Alkaline Phosphatase: 58 U/L (ref 38–126)
Anion gap: 10 (ref 5–15)
BUN: 8 mg/dL (ref 8–23)
CO2: 30 mmol/L (ref 22–32)
Calcium: 8.7 mg/dL — ABNORMAL LOW (ref 8.9–10.3)
Chloride: 105 mmol/L (ref 98–111)
Creatinine: 0.87 mg/dL (ref 0.44–1.00)
GFR, Estimated: >60 mL/min (ref >60)
Glucose, Bld: 84 mg/dL (ref 70–99)
Potassium: 3.4 mmol/L — ABNORMAL LOW (ref 3.5–5.1)
Sodium: 145 mmol/L (ref 135–145)
Total Bilirubin: <0.2 mg/dL — ABNORMAL LOW (ref 0.3–1.2)
Total Protein: 6.6 g/dL (ref 6.5–8.1)

## 2020-07-04 LAB — TSH: TSH: 1.828 u[IU]/mL (ref 0.308–3.960)

## 2020-07-04 MED ORDER — PROCHLORPERAZINE MALEATE 10 MG PO TABS
10.0000 mg | ORAL_TABLET | Freq: Once | ORAL | Status: AC
  Administered 2020-07-04: 10 mg via ORAL

## 2020-07-04 MED ORDER — SODIUM CHLORIDE 0.9 % IV SOLN
Freq: Once | INTRAVENOUS | Status: AC
  Filled 2020-07-04: qty 250

## 2020-07-04 MED ORDER — SODIUM CHLORIDE 0.9 % IV SOLN
200.0000 mg | Freq: Once | INTRAVENOUS | Status: AC
  Administered 2020-07-04: 200 mg via INTRAVENOUS
  Filled 2020-07-04: qty 8

## 2020-07-04 MED ORDER — SODIUM CHLORIDE 0.9% FLUSH
10.0000 mL | INTRAVENOUS | Status: DC | PRN
  Administered 2020-07-04: 10 mL
  Filled 2020-07-04: qty 10

## 2020-07-04 MED ORDER — HEPARIN SOD (PORK) LOCK FLUSH 100 UNIT/ML IV SOLN
500.0000 [IU] | Freq: Once | INTRAVENOUS | Status: AC | PRN
  Administered 2020-07-04: 500 [IU]
  Filled 2020-07-04: qty 5

## 2020-07-04 MED ORDER — PROCHLORPERAZINE MALEATE 10 MG PO TABS
ORAL_TABLET | ORAL | Status: AC
  Filled 2020-07-04: qty 1

## 2020-07-04 MED ORDER — SODIUM CHLORIDE 0.9 % IV SOLN
500.0000 mg/m2 | Freq: Once | INTRAVENOUS | Status: AC
  Administered 2020-07-04: 900 mg via INTRAVENOUS
  Filled 2020-07-04: qty 20

## 2020-07-04 MED ORDER — ACETAMINOPHEN 500 MG PO TABS
ORAL_TABLET | ORAL | Status: AC
  Filled 2020-07-04: qty 2

## 2020-07-04 MED ORDER — SODIUM CHLORIDE 0.9% FLUSH
10.0000 mL | Freq: Once | INTRAVENOUS | Status: AC
Start: 2020-07-04 — End: 2020-07-04
  Administered 2020-07-04: 10 mL
  Filled 2020-07-04: qty 10

## 2020-07-04 MED ORDER — ACETAMINOPHEN 500 MG PO TABS
1000.0000 mg | ORAL_TABLET | Freq: Once | ORAL | Status: AC
  Administered 2020-07-04: 1000 mg via ORAL

## 2020-07-04 NOTE — Patient Instructions (Signed)
[facility] Discharge Instructions for Patients Receiving Chemotherapy  Today you received the following chemotherapy agents Keytruda and Alimta  To help prevent nausea and vomiting after your treatment, we encourage you to take your nausea medication as directed.   If you develop nausea and vomiting that is not controlled by your nausea medication, call the clinic.   BELOW ARE SYMPTOMS THAT SHOULD BE REPORTED IMMEDIATELY:  *FEVER GREATER THAN 100.5 F  *CHILLS WITH OR WITHOUT FEVER  NAUSEA AND VOMITING THAT IS NOT CONTROLLED WITH YOUR NAUSEA MEDICATION  *UNUSUAL SHORTNESS OF BREATH  *UNUSUAL BRUISING OR BLEEDING  TENDERNESS IN MOUTH AND THROAT WITH OR WITHOUT PRESENCE OF ULCERS  *URINARY PROBLEMS  *BOWEL PROBLEMS  UNUSUAL RASH Items with * indicate a potential emergency and should be followed up as soon as possible.  Feel free to call the clinic should you have any questions or concerns. The clinic phone number is [phone].  Please show the [facility] at check-in to the Emergency Department and triage nurse.

## 2020-07-04 NOTE — Progress Notes (Signed)
Met with patient at registration to provide available assistance application for Alimta through [place]. Patient was very appreciative and signed application.Left physician form on desk for him to sign and RN to return to me. Patient received a gift card today from grant ad submitted another expense.  She has my card for any additional financial questions or concerns.

## 2020-07-04 NOTE — Progress Notes (Signed)
In regards to [employer] for [place], once benefit investigation was completed, it was determined patient was not being left with OOP for Keytruda as OOP had been satisfied.

## 2020-07-04 NOTE — Progress Notes (Signed)
Received signed physician form from RN.  Faxed completed application [employer] for copay assistance with Alimta.  Fax received ok per confirmation sheet.

## 2020-07-04 NOTE — Progress Notes (Signed)
[place] [phone]   [phone]  PROGRESS NOTE  Patient Care Team: Minette Brine, FNP as PCP - General (General Practice)  Hematological/Oncological History # Metastatic Adenocarcinoma of the Lung #Brain Metastasis 1) 11/08/2019: establish care with Dr. Julien Nordmann and his PA Cassie Heilingoetter. Noted to have widely metastatic lung cancer with involvement of the brain, lymph nodes, and right lung.  2) 12/04/2019: palliative radiation to the right lung mass/cervical adenopathy 3) 9/7/02021: started therapy with Carbo/Pem/Pem 4) 9/29-10/09/2019: patient underwent SRS to the brain mets 5) 03/22/2020: repeat CT C/A/P and neck scheduled. Transfer care to Dr. Lorenso Courier.   6) 03/22/2020: CT C/A/P showed interval significant improvement in previously demonstrated extensive confluent lymphadenopathy in the superior mediastinum and right hilar regions.  Interval History:  Elizabeth Mayer 60 y.o. female with medical history significant for metastatic adenocarcinoma of the lung who presents for follow up. The patient's last visit was on 06/13/2020. In there interim she completed Cycle 9 of Pem/Pem maintenance.   On exam today Elizabeth Mayer notes been well overall in the interim since her last visit.  She notes that she has been having some issues with dry eyes and some occasional dizzy spells.  She has a rare shortness of breath and occasional palpitations.  She does note some occasional tingling in her toes.  She reports that her appetite is "so-so".  She notes that her energy has also been so-so.  She notes that she very rarely takes her pain medication.  She recently saw an eye doctor and received some drops to help with her dry eyes.  She is on 3 L of oxygen and notes that this level has been stable.  Otherwise she has not noticed any side effects as a result of her chemotherapy treatment.  She is willing and able to proceed with treatment today.  A full 10 point ROS is  listed below.  MEDICAL HISTORY:  Past Medical History:  Diagnosis Date  . Anemia   . Angina 1982   related to stress  . Asthma    in the past,  no current problems  . Complication of anesthesia    states anesthesia made her hair fall out  . Constipation   . Dyspnea    on oxygen at home - 2L via [place]  . Fatigue   . GERD (gastroesophageal reflux disease)    patient denies this dx  . Headache   . Heart murmur 1970s   no problems currently  . Ingrown toenail   . Lung cancer ([place]) 10/2019   metastatic disease to the brain  . On home oxygen therapy    2.2 lpm, 24 hours a day  . Past heart attack 1980-1981   pt states she passed out and woke up in hospital- told she had heart attack, but then dr said he couldn't find anything wrong.  . Pneumonia    x 1    SURGICAL HISTORY: Past Surgical History:  Procedure Laterality Date  . CERVICAL DISC SURGERY  2000   Disc removed from neck   . ingrown toe nail surgery Bilateral   . IR IMAGING GUIDED PORT INSERTION  02/16/2020  . MULTIPLE TOOTH EXTRACTIONS     for braces  . RADIOLOGY WITH ANESTHESIA N/A 12/05/2019   Procedure: MRI BRAIN WITH AND WITHOUT CONTRAST;  Surgeon: Radiologist, Medication, MD;  Location: [facility];  Service: Radiology;  Laterality: N/A;  . RADIOLOGY WITH ANESTHESIA N/A 01/02/2020   Procedure: MRI BRAIN WITH AND WITHOUT  CONTRAST;  Surgeon: Radiologist, Medication, MD;  Location: [facility];  Service: Radiology;  Laterality: N/A;  . RADIOLOGY WITH ANESTHESIA N/A 02/20/2020   Procedure: MRI WITH ANESTHESIA OF BRAIN WITH AND WITHOUT CONTRAST;  Surgeon: Radiologist, Medication, MD;  Location: [facility];  Service: Radiology;  Laterality: N/A;  . RADIOLOGY WITH ANESTHESIA N/A 06/11/2020   Procedure: MRI WITH ANESTHESIA OF BRAIN WITH AND WITHOUT CONTRAST;  Surgeon: Radiologist, Medication, MD;  Location: [facility];  Service: Radiology;  Laterality: N/A;  . TONSILLECTOMY      SOCIAL HISTORY: Social History   Socioeconomic History  .  Marital status: Married    Spouse name: Not on file  . Number of children: 0  . Years of education: Not on file  . Highest education level: Not on file  Occupational History  . Occupation: Secondary school teacher  Tobacco Use  . Smoking status: Former Smoker    Packs/day: 1.00    Years: 20.00    Pack years: 20.00    Types: Cigarettes    Start date: 42    Quit date: 2001    Years since quitting: 21.2  . Smokeless tobacco: Never Used  Vaping Use  . Vaping Use: Never used  Substance and Sexual Activity  . Alcohol use: No  . Drug use: No  . Sexual activity: Yes    Birth control/protection: None, Post-menopausal  Other Topics Concern  . Not on file  Social History Narrative  . Not on file   Social Determinants of Health   Financial Resource Strain: High Risk  . Difficulty of Paying Living Expenses: Hard  Food Insecurity: No Food Insecurity  . Worried About Charity fundraiser in the Last Year: Never true  . Ran Out of Food in the Last Year: Never true  Transportation Needs: No Transportation Needs  . Lack of Transportation (Medical): No  . Lack of Transportation (Non-Medical): No  Physical Activity: Inactive  . Days of Exercise per Week: 0 days  . Minutes of Exercise per Session: 0 min  Stress: Not on file  Social Connections: Moderately Isolated  . Frequency of Communication with Friends and Family: Twice a week  . Frequency of Social Gatherings with Friends and Family: Twice a week  . Attends Religious Services: More than 4 times per year  . Active Member of Clubs or Organizations: No  . Attends Archivist Meetings: Not on file  . Marital Status: Separated  Intimate Partner Violence: Not on file    FAMILY HISTORY: Family History  Problem Relation Age of Onset  . Arthritis Mother   . Heart failure Father     ALLERGIES:  is allergic to pseudoephedrine, gabapentin, mobic [meloxicam], and penicillins.  MEDICATIONS:  Current Outpatient Medications   Medication Sig Dispense Refill  . Carboxymethylcellulose Sodium (REFRESH CELLUVISC OP) Apply to eye.    Marland Kitchen acetaminophen (TYLENOL) 500 MG tablet Take 1,000 mg by mouth every 6 (six) hours as needed for moderate pain.     Marland Kitchen albuterol (VENTOLIN HFA) 108 (90 Base) MCG/ACT inhaler Inhale 2 puffs into the lungs every 6 (six) hours as needed for wheezing or shortness of breath. 18 g 6  . Artificial Saliva (BIOTENE DRY MOUTH MOISTURIZING) SOLN Use as directed 1 Dose in the mouth or throat as needed (dry mouth).    . budesonide-formoterol (SYMBICORT) 80-4.5 MCG/ACT inhaler Inhale 2 puffs into the lungs in the morning and at bedtime. 1 each 11  . camphor-menthol (SARNA) lotion Apply 1 application topically as needed  for itching.    . Carboxymethylcellul-Glycerin (LUBRICATING EYE DROPS OP) Place 1 drop into both eyes daily as needed (irritation).    . Cream Base (WOUND CARE) CREA Apply 1 application topically as needed (irritation.).    Marland Kitchen fexofenadine (ALLEGRA) 180 MG tablet Take 180 mg by mouth daily.    . folic acid (FOLVITE) 1 MG tablet Take 1 tablet (1 mg total) by mouth daily. 30 tablet 4  . guaiFENesin (MUCINEX) 600 MG 12 hr tablet Take 600 mg by mouth 2 (two) times daily.     Marland Kitchen HYDROcodone-acetaminophen (HYCET) 7.5-325 mg/15 ml solution Take 10 mLs by mouth 2 (two) times daily. For pain from cancer and radiation reactions (Patient taking differently: Take 10 mLs by mouth 2 (two) times daily as needed for moderate pain or severe pain. For pain from cancer and radiation reactions) 473 mL 0  . lidocaine (XYLOCAINE) 2 % solution Use as directed 15 mLs in the mouth or throat as needed for mouth pain. Swallow 30 min prior to meals and at bedtime for throat/esophagus pain (Patient not taking: No sig reported) 250 mL 1  . lidocaine-prilocaine (EMLA) cream Apply 1 application topically as needed. (Patient taking differently: Apply 1 application topically as needed (port access).) 30 g 2  . LORazepam (ATIVAN) 0.5  MG tablet Please take 1 tablet 30 minutes before your scan as needed for anxiety (Patient not taking: No sig reported) 2 tablet 0  . LORazepam (ATIVAN) 2 MG tablet Take 1 tab 30 min before radiation treatment to the brain. (Patient not taking: No sig reported) 1 tablet 0  . Multiple Vitamin (MULTIVITAMIN WITH MINERALS) TABS tablet Take 1 tablet by mouth daily.    Marland Kitchen neomycin-polymyxin-hydrocortisone (CORTISPORIN) OTIC solution Apply 1-2 drops to toe after soaking BID (Patient taking differently: 1-2 drops See admin instructions. Apply 1-2 drops to toe after soaking twice daily) 10 mL 1  . OXYGEN Inhale 3 L/min into the lungs continuous.    . potassium chloride 20 MEQ/15ML (10%) SOLN Take 15 mLs (20 mEq total) by mouth 2 (two) times daily. (Patient not taking: No sig reported) 210 mL 0  . prochlorperazine (COMPAZINE) 10 MG tablet Take 1 tablet (10 mg total) by mouth every 6 (six) hours as needed. (Patient not taking: No sig reported) 30 tablet 2  . sucralfate (CARAFATE) 1 g tablet Take 1 tablet (1 g total) by mouth 4 (four) times daily -  with meals and at bedtime. (Patient not taking: No sig reported) 90 tablet 2  . triamcinolone ointment (KENALOG) 0.1 % Apply 1 application topically in the morning and at bedtime.     No current facility-administered medications for this visit.    REVIEW OF SYSTEMS:   Constitutional: ( - ) fevers, ( - )  chills , ( - ) night sweats Eyes: ( - ) blurriness of vision, ( - ) double vision, ( - ) watery eyes Ears, nose, mouth, throat, and face: ( - ) mucositis, ( - ) sore throat Respiratory: ( - ) cough, ( - ) dyspnea, ( - ) wheezes Cardiovascular: ( - ) palpitation, ( - ) chest discomfort, ( - ) lower extremity swelling Gastrointestinal:  ( - ) nausea, ( - ) heartburn, ( - ) change in bowel habits Skin: ( - ) abnormal skin rashes Lymphatics: ( - ) new lymphadenopathy, ( - ) easy bruising Neurological: ( - ) numbness, ( - ) tingling, ( - ) new  weaknesses Behavioral/Psych: ( - ) mood change, ( - )  new changes  All other systems were reviewed with the patient and are negative.  PHYSICAL EXAMINATION: ECOG PERFORMANCE STATUS: 1 - Symptomatic but completely ambulatory  Vitals:   07/04/20 1329  BP: (!) 157/77  Pulse: 84  Resp: 18  Temp: 98.5 F (36.9 C)  SpO2: 100%   Filed Weights   07/04/20 1329  Weight: 148 lb 9.6 oz (67.4 kg)    GENERAL: well appearing middle aged [place] American female. alert, no distress and comfortable SKIN: skin color, texture, turgor are normal, no rashes or significant lesions EYES: conjunctiva are pink and non-injected, sclera clear LUNGS: clear to auscultation and percussion with normal breathing effort HEART: regular rate & rhythm and no murmurs and no lower extremity edema Musculoskeletal: no cyanosis of digits and no clubbing  PSYCH: alert & oriented x 3, fluent speech NEURO: no focal motor/sensory deficits  LABORATORY DATA:  I have reviewed the data as listed CBC Latest Ref Rng & Units 07/04/2020 06/13/2020 05/22/2020  WBC 4.0 - 10.5 K/uL 4.2 4.5 4.2  Hemoglobin 12.0 - 15.0 g/dL 9.7(L) 9.8(L) 9.4(L)  Hematocrit 36.0 - 46.0 % 30.3(L) 31.2(L) 29.9(L)  Platelets 150 - 400 K/uL 202 199 185    CMP Latest Ref Rng & Units 06/13/2020 05/22/2020 05/02/2020  Glucose 70 - 99 mg/dL 117(H) 92 82  BUN 8 - 23 mg/dL 9 10 7(L)  Creatinine 0.44 - 1.00 mg/dL 0.92 0.94 0.78  Sodium 135 - 145 mmol/L 145 144 143  Potassium 3.5 - 5.1 mmol/L 3.6 3.5 3.5  Chloride 98 - 111 mmol/L 107 106 106  CO2 22 - 32 mmol/L 29 27 28   Calcium 8.9 - 10.3 mg/dL 9.0 8.8(L) 9.1  Total Protein 6.5 - 8.1 g/dL 6.5 6.4(L) 6.6  Total Bilirubin 0.3 - 1.2 mg/dL 0.2(L) 0.3 0.2(L)  Alkaline Phos 38 - 126 U/L 49 43 48  AST 15 - 41 U/L 27 26 25   ALT 0 - 44 U/L 21 16 9     RADIOGRAPHIC STUDIES: MR BRAIN W WO CONTRAST  Result Date: 06/11/2020 CLINICAL DATA:  Brain/CNS neoplasm.  Assess treatment response. EXAM: MRI HEAD WITHOUT AND  WITH CONTRAST TECHNIQUE: Multiplanar, multiecho pulse sequences of the brain and surrounding structures were obtained without and with intravenous contrast. CONTRAST:  29mL GADAVIST GADOBUTROL 1 MMOL/ML IV SOLN COMPARISON:  MRI of the brain February 20, 2020 FINDINGS: Brain: No acute infarction, hemorrhage, hydrocephalus or extra-axial collection. Today's study was performed with a different postcontrast protocol and is partially degraded by motion which may decrease the sensitivity for small enhancing lesions. Enhancing lesions are again demonstrated scattered throughout the brain parenchyma, as described below and labeled on series 10: Stable lesions 1. Left superior frontal gyrus, 9 mm (9 mm on prior), image 41; 2. Right superior frontal gyrus, 6 mm (6 mm on prior), image 40; 3. Right precentral gyrus, 4 mm (4 mm on prior), image 38; 4. Medial left parietal, 2 mm (2-3 mm on prior), image 34; 5. Right anterior temporal lobe, 5 mm (5 mm on prior), 18. Smaller lesion 1. Left superior frontal gyrus, 2 mm (3 mm on prior) image 35; 2. Right parietal lobe, 3 mm (6 mm on prior), no leptomeningeal spread, image 34. Possible new lesion 1. Right parietal lobe, 2 mm (new from prior), image 38. This lesion not well seen on sagittal and coronal images raising suspicion for artifact. Vascular: Normal flow voids. Skull and upper cervical spine: Normal marrow signal. Sinuses/Orbits: Mild mucosal thickening throughout the paranasal sinuses. The orbits are  maintained. Other: Minimal left mastoid effusion. IMPRESSION: 1. Seven lesions enhancing are stable or decreased in size when compared to prior MRI. The other lesions described on prior MRI are no longer seen. 2. One possible new lesion is identified on axial postcontrast images. However, this lesion is not identified on sagittal or coronal images and may represent artifact. Attention on follow-up suggested. Electronically Signed   By: Pedro Earls M.D.   On:  06/11/2020 15:00    ASSESSMENT & PLAN Elizabeth Mayer 60 y.o. female with medical history significant for metastatic adenocarcinoma of the lung who presents for follow up.    After review the labs, the records, discussion with the patient the findings are most consistent with metastatic adenocarcinoma the lung without any targetable mutations.  The patient has been started on carboplatin pemetrexed pembrolizumab every 3 weeks with Dr. Julien Nordmann, however the patient has expressed a desire to transition to another provider.  As such we were happy to take up her care here.  She already discontinued the carboplatin and is on maintenance pemetrexed/pembrolizumab therapy. We assumed her care with Cycle 6 on 04/04/2020. Today is Cycle 10 Day 1.     After discussion with the patient and review of her blood work she appears appropriate for continued treatment at this time.  # Metastatic Adenocarcinoma of the Lung #Brain Metastasis --repeat scans showed the patient is having an appropriate response to treatment with Carbo/Pem/Pem chemotherapy. Next scan due early April 2022.   --continue with maintenance Pem/Pem, Cycle 10 Day 1 today --no targetable mutations noted on prior workup. --continue to follow with Dr. Laqueta Due Onc for metastatic spread to the brain.  --can consider docetaxel/ramucirumab at time of progression.  --RTC in 3 weeks for clinic visit and continued chemo treatments on 07/25/2020  No orders of the defined types were placed in this encounter.   All questions were answered. The patient knows to call the clinic with any problems, questions or concerns.  A total of more than 30 minutes were spent on this encounter and over half of that time was spent on counseling and coordination of care as outlined above.   Ledell Peoples, MD Department of Hematology/Oncology [place] [facility] Phone: [phone] Pager: [phone] Email:  [email or web address]  07/04/2020 1:45 PM

## 2020-07-07 ENCOUNTER — Encounter: Payer: Self-pay | Admitting: Hematology and Oncology

## 2020-07-08 ENCOUNTER — Other Ambulatory Visit: Payer: Self-pay | Admitting: Hematology and Oncology

## 2020-07-08 ENCOUNTER — Telehealth: Payer: Self-pay | Admitting: *Deleted

## 2020-07-08 DIAGNOSIS — C3491 Malignant neoplasm of unspecified part of right bronchus or lung: Secondary | ICD-10-CM

## 2020-07-08 MED ORDER — LORAZEPAM 0.5 MG PO TABS: ORAL_TABLET | ORAL | 0 refills | Status: DC

## 2020-07-08 NOTE — Telephone Encounter (Signed)
Received call from patient seeking to clarify her treatment appts. Reviewed them with her and she voiced understanding of the every 3 weeks schedule.  She is also requesting her usual Lorazepam 0.5 mg tablet prior to her CT scan on 07/12/20  This needs to be sent to her [facility]

## 2020-07-09 ENCOUNTER — Encounter: Payer: Self-pay | Admitting: Internal Medicine

## 2020-07-09 ENCOUNTER — Telehealth: Payer: Self-pay | Admitting: Hematology and Oncology

## 2020-07-09 NOTE — Progress Notes (Signed)
Received approval letter from [facility] for copay for Alimta.  Patient approved for $25,000 03/06/20 - 07/04/21. His copay will be no more than $25 after insurance pays their portion if he is left with a balance.  A copy of the approval letter will be mailed by Ralph Leyden to patient for his records only.  A copy given to [facility] for billing/copay claim submissions.

## 2020-07-09 NOTE — Telephone Encounter (Signed)
Scheduled per los. Called and spoke with patient. Confirmed appt 

## 2020-07-12 ENCOUNTER — Ambulatory Visit (HOSPITAL_COMMUNITY): Payer: BC Managed Care – PPO

## 2020-07-15 ENCOUNTER — Other Ambulatory Visit: Payer: Self-pay

## 2020-07-15 ENCOUNTER — Encounter (HOSPITAL_COMMUNITY): Payer: Self-pay

## 2020-07-15 ENCOUNTER — Ambulatory Visit (HOSPITAL_COMMUNITY)
Admission: RE | Admit: 2020-07-15 | Discharge: 2020-07-15 | Disposition: A | Discharge status: Home / Self Care | Payer: BC Managed Care – PPO | Source: Ambulatory Visit | Attending: Hematology and Oncology | Admitting: Hematology and Oncology

## 2020-07-15 DIAGNOSIS — I700 Atherosclerosis of aorta: Secondary | ICD-10-CM | POA: Diagnosis not present

## 2020-07-15 DIAGNOSIS — C3491 Malignant neoplasm of unspecified part of right bronchus or lung: Secondary | ICD-10-CM | POA: Insufficient documentation

## 2020-07-15 DIAGNOSIS — J432 Centrilobular emphysema: Secondary | ICD-10-CM | POA: Diagnosis not present

## 2020-07-15 DIAGNOSIS — C3490 Malignant neoplasm of unspecified part of unspecified bronchus or lung: Secondary | ICD-10-CM | POA: Diagnosis not present

## 2020-07-15 DIAGNOSIS — M5137 Other intervertebral disc degeneration, lumbosacral region: Secondary | ICD-10-CM | POA: Diagnosis not present

## 2020-07-15 DIAGNOSIS — D259 Leiomyoma of uterus, unspecified: Secondary | ICD-10-CM | POA: Diagnosis not present

## 2020-07-15 MED ORDER — HEPARIN SOD (PORK) LOCK FLUSH 100 UNIT/ML IV SOLN
INTRAVENOUS | Status: AC
  Filled 2020-07-15: qty 5

## 2020-07-15 MED ORDER — IOHEXOL 300 MG/ML  SOLN
100.0000 mL | Freq: Once | INTRAMUSCULAR | Status: AC | PRN
  Administered 2020-07-15: 100 mL via INTRAVENOUS

## 2020-07-17 ENCOUNTER — Telehealth: Payer: Self-pay | Admitting: *Deleted

## 2020-07-17 NOTE — Telephone Encounter (Signed)
TCT patient regarding recent scan results.  No answer. Was able to leave vm message for pt to call back regarding her scan results. @ [phone]

## 2020-07-17 NOTE — Telephone Encounter (Signed)
-----   Message from Orson Slick, MD sent at 07/17/2020  9:18 AM EDT ----- Please let Mrs. Hazan know that her CT scan showed excellent response to chemotherapy. There is no evidence of tumor growth or spread of tumor. We will continue on her current chemotherapy regimen as scheduled.  ----- Message ----- From: Interface, Rad Results In Sent: 07/17/2020   9:16 AM EDT To: Orson Slick, MD

## 2020-07-25 ENCOUNTER — Inpatient Hospital Stay (HOSPITAL_BASED_OUTPATIENT_CLINIC_OR_DEPARTMENT_OTHER): Payer: BC Managed Care – PPO | Admitting: Hematology and Oncology

## 2020-07-25 ENCOUNTER — Other Ambulatory Visit: Payer: Self-pay

## 2020-07-25 ENCOUNTER — Encounter: Payer: Self-pay | Admitting: Hematology and Oncology

## 2020-07-25 ENCOUNTER — Telehealth: Payer: Self-pay | Admitting: *Deleted

## 2020-07-25 ENCOUNTER — Other Ambulatory Visit: Payer: BC Managed Care – PPO

## 2020-07-25 ENCOUNTER — Inpatient Hospital Stay: Payer: BC Managed Care – PPO | Attending: Internal Medicine

## 2020-07-25 ENCOUNTER — Inpatient Hospital Stay: Payer: BC Managed Care – PPO

## 2020-07-25 VITALS — BP 162/80 | HR 84 | Temp 97.8°F | Resp 17 | Ht 66.0 in | Wt 150.2 lb

## 2020-07-25 DIAGNOSIS — C3491 Malignant neoplasm of unspecified part of right bronchus or lung: Secondary | ICD-10-CM

## 2020-07-25 DIAGNOSIS — Z5111 Encounter for antineoplastic chemotherapy: Secondary | ICD-10-CM

## 2020-07-25 DIAGNOSIS — Z79899 Other long term (current) drug therapy: Secondary | ICD-10-CM | POA: Insufficient documentation

## 2020-07-25 DIAGNOSIS — C770 Secondary and unspecified malignant neoplasm of lymph nodes of head, face and neck: Secondary | ICD-10-CM | POA: Insufficient documentation

## 2020-07-25 DIAGNOSIS — Z87891 Personal history of nicotine dependence: Secondary | ICD-10-CM | POA: Insufficient documentation

## 2020-07-25 DIAGNOSIS — Z5112 Encounter for antineoplastic immunotherapy: Secondary | ICD-10-CM

## 2020-07-25 DIAGNOSIS — Z95828 Presence of other vascular implants and grafts: Secondary | ICD-10-CM

## 2020-07-25 DIAGNOSIS — C7931 Secondary malignant neoplasm of brain: Secondary | ICD-10-CM | POA: Diagnosis not present

## 2020-07-25 LAB — CMP (CANCER CENTER ONLY)
ALT: 18 U/L (ref 0–44)
AST: 37 U/L (ref 15–41)
Albumin: 3.4 g/dL — ABNORMAL LOW (ref 3.5–5.0)
Alkaline Phosphatase: 46 U/L (ref 38–126)
Anion gap: 11 (ref 5–15)
BUN: 7 mg/dL — ABNORMAL LOW (ref 8–23)
CO2: 29 mmol/L (ref 22–32)
Calcium: 8.9 mg/dL (ref 8.9–10.3)
Chloride: 104 mmol/L (ref 98–111)
Creatinine: 0.86 mg/dL (ref 0.44–1.00)
GFR, Estimated: >60 mL/min (ref >60)
Glucose, Bld: 83 mg/dL (ref 70–99)
Potassium: 3.4 mmol/L — ABNORMAL LOW (ref 3.5–5.1)
Sodium: 144 mmol/L (ref 135–145)
Total Bilirubin: 0.3 mg/dL (ref 0.3–1.2)
Total Protein: 6.4 g/dL — ABNORMAL LOW (ref 6.5–8.1)

## 2020-07-25 LAB — CBC WITH DIFFERENTIAL (CANCER CENTER ONLY)
Abs Immature Granulocytes: 0.01 10*3/uL (ref 0.00–0.07)
Basophils Absolute: 0 10*3/uL (ref 0.0–0.1)
Basophils Relative: 1 %
Eosinophils Absolute: 0.1 10*3/uL (ref 0.0–0.5)
Eosinophils Relative: 4 %
HCT: 28.6 % — ABNORMAL LOW (ref 36.0–46.0)
Hemoglobin: 9.1 g/dL — ABNORMAL LOW (ref 12.0–15.0)
Immature Granulocytes: 0 %
Lymphocytes Relative: 18 %
Lymphs Abs: 0.5 10*3/uL — ABNORMAL LOW (ref 0.7–4.0)
MCH: 29.7 pg (ref 26.0–34.0)
MCHC: 31.8 g/dL (ref 30.0–36.0)
MCV: 93.5 fL (ref 80.0–100.0)
Monocytes Absolute: 0.7 10*3/uL (ref 0.1–1.0)
Monocytes Relative: 24 %
Neutro Abs: 1.5 10*3/uL — ABNORMAL LOW (ref 1.7–7.7)
Neutrophils Relative %: 53 %
Platelet Count: 148 10*3/uL — ABNORMAL LOW (ref 150–400)
RBC: 3.06 MIL/uL — ABNORMAL LOW (ref 3.87–5.11)
RDW: 15.9 % — ABNORMAL HIGH (ref 11.5–15.5)
WBC Count: 2.8 10*3/uL — ABNORMAL LOW (ref 4.0–10.5)
nRBC: 0 % (ref 0.0–0.2)

## 2020-07-25 LAB — MAGNESIUM: Magnesium: 1.7 mg/dL (ref 1.7–2.4)

## 2020-07-25 LAB — TSH: TSH: 1.903 u[IU]/mL (ref 0.308–3.960)

## 2020-07-25 MED ORDER — SODIUM CHLORIDE 0.9 % IV SOLN
Freq: Once | INTRAVENOUS | Status: AC
  Filled 2020-07-25: qty 250

## 2020-07-25 MED ORDER — FOLIC ACID 1 MG PO TABS: 1.0000 mg | ORAL_TABLET | Freq: Every day | ORAL | 2 refills | Status: DC

## 2020-07-25 MED ORDER — HEPARIN SOD (PORK) LOCK FLUSH 100 UNIT/ML IV SOLN
500.0000 [IU] | Freq: Once | INTRAVENOUS | Status: AC | PRN
  Administered 2020-07-25: 500 [IU]
  Filled 2020-07-25: qty 5

## 2020-07-25 MED ORDER — SODIUM CHLORIDE 0.9 % IV SOLN
200.0000 mg | Freq: Once | INTRAVENOUS | Status: AC
  Administered 2020-07-25: 200 mg via INTRAVENOUS
  Filled 2020-07-25: qty 8

## 2020-07-25 MED ORDER — SODIUM CHLORIDE 0.9 % IV SOLN
500.0000 mg/m2 | Freq: Once | INTRAVENOUS | Status: AC
  Administered 2020-07-25: 900 mg via INTRAVENOUS
  Filled 2020-07-25: qty 20

## 2020-07-25 MED ORDER — PROCHLORPERAZINE MALEATE 10 MG PO TABS
ORAL_TABLET | ORAL | Status: AC
  Filled 2020-07-25: qty 1

## 2020-07-25 MED ORDER — PROCHLORPERAZINE MALEATE 10 MG PO TABS
10.0000 mg | ORAL_TABLET | Freq: Once | ORAL | Status: AC
  Administered 2020-07-25: 10 mg via ORAL

## 2020-07-25 MED ORDER — SODIUM CHLORIDE 0.9% FLUSH
10.0000 mL | INTRAVENOUS | Status: DC | PRN
  Administered 2020-07-25: 10 mL
  Filled 2020-07-25: qty 10
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
## 2020-07-25 NOTE — Patient Instructions (Signed)
Tunneled Central Venous Catheter Flushing Guide  It is important to flush your tunneled central venous catheter each time you use it, both before and after you use it. Flushing your catheter will help prevent it from clogging. What are the risks? Risks may include:  Infection.  Air getting into the catheter and bloodstream. Supplies needed:  A clean pair of gloves.  A disinfecting wipe. Use an alcohol wipe, chlorhexidine wipe, or iodine wipe as told by your health care provider.  A 10 mL syringe that has been prefilled with saline solution.  An empty 10 mL syringe, if a substance called heparin was injected into your catheter. How to flush your catheter When you flush your catheter, make sure you follow any specific instructions from your health care provider or the manufacturer. These are general guidelines. Flushing your catheter before use If there is heparin in your catheter: 1. Wash your hands with soap and water. 2. Put on gloves. 3. Scrub the injection cap for a minimum of 15 seconds with a disinfecting wipe. 4. Unclamp the catheter. 5. Attach the empty syringe to the injection cap. 6. Pull the syringe plunger back and withdraw 10 mL of blood. 7. Place the syringe into an appropriate waste container. 8. Scrub the injection cap for 15 seconds with a disinfecting wipe. 9. Attach the prefilled syringe to the injection cap. 10. Flush the catheter by pushing the plunger forward until all the liquid from the syringe is in the catheter. 11. Remove the syringe from the injection cap. 12. Clamp the catheter. If there is no heparin in your catheter: 1. Wash your hands with soap and water. 2. Put on gloves. 3. Scrub the injection cap for 15 seconds with a disinfecting wipe. 4. Unclamp the catheter. 5. Attach the prefilled syringe to the injection cap. 6. Flush the catheter by pushing the plunger forward until 5 mL of the liquid from the syringe is in the catheter. 7. Pull back on  the syringe until you see blood in the catheter. 8. If you have been asked to collect any blood, follow your health care provider's instructions. Otherwise, flush the catheter with the rest of the solution from the syringe. 9. Remove the syringe from the injection cap. 10. Clamp the catheter.   Flushing your catheter after use 1. Wash your hands with soap and water. 2. Put on gloves. 3. Scrub the injection cap for 15 seconds with a disinfecting wipe. 4. Unclamp the catheter. 5. Attach the prefilled syringe to the injection cap. 6. Flush the catheter by pushing the plunger forward until all of the liquid from the syringe is in the catheter. 7. Remove the syringe from the injection cap. 8. Clamp the catheter. Problems and solutions  If blood cannot be completely cleared from the injection cap, you may need to have the injection cap replaced.  If the catheter is difficult to flush, use the pulsing method. The pulsing method involves pushing only a few milliliters of solution into the catheter at a time and pausing between pushes.  If you do not see blood in the catheter when you pull back on the syringe, change your body position, such as by raising your arms above your head. Take a deep breath and cough. Then, pull back on the syringe. If you still do not see blood, flush the catheter with a small amount of solution. Then, change positions again and take a breath or cough. Pull back on the syringe again. If you still do not   see blood, finish flushing the catheter and contact your health care provider. Do not use your catheter until your health care provider says it is okay. General tips  Have someone help you flush your catheter, if possible.  Do not force fluid through your catheter.  Do not use a syringe that is larger or smaller than 10 mL. Using a smaller syringe can make the catheter burst.  Do not use your catheter without flushing it first if it has heparin in it. Contact a health  care provider if:  You cannot see any blood in the catheter when you flush it before using it.  Your catheter is difficult to flush. Get help right away if:  You cannot flush the catheter.  The catheter leaks when you flush it or when there is fluid in it.  There are cracks or breaks in the catheter. Summary  It is important to flush your tunneled central venous catheter each time you use it, both before and after you use it.  Scrub the injection cap for 15 seconds with a disinfecting wipe before and after you flush it.  When you flush your catheter, make sure you follow any specific instructions from your health care provider or the manufacturer.  Get help right away if you cannot flush the catheter. This information is not intended to replace advice given to you by your health care provider. Make sure you discuss any questions you have with your health care provider. Document Revised: 06/01/2019 Document Reviewed: 06/08/2018 Elsevier Patient Education  2021 [employer]vier Inc.  

## 2020-07-25 NOTE — Progress Notes (Signed)
[place] [phone]   [phone]  PROGRESS NOTE  Patient Care Team: Minette Brine, FNP as PCP - General (General Practice)  Hematological/Oncological History # Metastatic Adenocarcinoma of the Lung #Brain Metastasis 1) 11/08/2019: establish care with Dr. Julien Nordmann and his PA Cassie Heilingoetter. Noted to have widely metastatic lung cancer with involvement of the brain, lymph nodes, and right lung.  2) 12/04/2019: palliative radiation to the right lung mass/cervical adenopathy 3) 9/7/02021: started therapy with Carbo/Pem/Pem 4) 9/29-10/09/2019: patient underwent SRS to the brain mets 5) 03/22/2020: repeat CT C/A/P and neck scheduled. Transfer care to Dr. Lorenso Courier.   6) 03/22/2020: CT C/A/P showed interval significant improvement in previously demonstrated extensive confluent lymphadenopathy in the superior mediastinum and right hilar regions.  Interval History:  Elizabeth Mayer 60 y.o. female with medical history significant for metastatic adenocarcinoma of the lung who presents for follow up. The patient's last visit was on 07/04/2020. In there interim she completed Cycle 10 of Pem/Pem maintenance.   On exam today Elizabeth Mayer notes she no major changes in her health in the last 3 weeks.  She reports that she is "still aboveground".  She notes that her "body is not liking chemo".  And it takes a good long while for this to get out of her system.  She does that she has had some vision changes with some dryness of her eyes which have been helped with eyedrops.  She notes that she does occasionally have some dizzy spells and "feels bad".  She elaborates on this by saying that she feels fatigued/wiped out.  She reports her appetite is "okay".  She is doing her best to drink more shakes.  She otherwise denies any nausea, vomiting, or diarrhea.  She is on 3 L of oxygen and notes that this level has been stable.  Otherwise she has not noticed any side effects as a result  of her chemotherapy treatment.  She is willing and able to proceed with treatment today.  A full 10 point ROS is listed below.  MEDICAL HISTORY:  Past Medical History:  Diagnosis Date  . Anemia   . Angina 1982   related to stress  . Asthma    in the past,  no current problems  . Complication of anesthesia    states anesthesia made her hair fall out  . Constipation   . Dyspnea    on oxygen at home - 2L via [place]  . Fatigue   . GERD (gastroesophageal reflux disease)    patient denies this dx  . Headache   . Heart murmur 1970s   no problems currently  . Ingrown toenail   . Lung cancer ([place]) 10/2019   metastatic disease to the brain  . On home oxygen therapy    2.2 lpm, 24 hours a day  . Past heart attack 1980-1981   pt states she passed out and woke up in hospital- told she had heart attack, but then dr said he couldn't find anything wrong.  . Pneumonia    x 1    SURGICAL HISTORY: Past Surgical History:  Procedure Laterality Date  . CERVICAL DISC SURGERY  2000   Disc removed from neck   . ingrown toe nail surgery Bilateral   . IR IMAGING GUIDED PORT INSERTION  02/16/2020  . MULTIPLE TOOTH EXTRACTIONS     for braces  . RADIOLOGY WITH ANESTHESIA N/A 12/05/2019   Procedure: MRI BRAIN WITH AND WITHOUT CONTRAST;  Surgeon: Radiologist, Medication, MD;  Location: [place];  Service: Radiology;  Laterality: N/A;  . RADIOLOGY WITH ANESTHESIA N/A 01/02/2020   Procedure: MRI BRAIN WITH AND WITHOUT CONTRAST;  Surgeon: Radiologist, Medication, MD;  Location: [place];  Service: Radiology;  Laterality: N/A;  . RADIOLOGY WITH ANESTHESIA N/A 02/20/2020   Procedure: MRI WITH ANESTHESIA OF BRAIN WITH AND WITHOUT CONTRAST;  Surgeon: Radiologist, Medication, MD;  Location: [place];  Service: Radiology;  Laterality: N/A;  . RADIOLOGY WITH ANESTHESIA N/A 06/11/2020   Procedure: MRI WITH ANESTHESIA OF BRAIN WITH AND WITHOUT CONTRAST;  Surgeon: Radiologist, Medication, MD;  Location: [place];  Service:  Radiology;  Laterality: N/A;  . TONSILLECTOMY      SOCIAL HISTORY: Social History   Socioeconomic History  . Marital status: Married    Spouse name: Not on file  . Number of children: 0  . Years of education: Not on file  . Highest education level: Not on file  Occupational History  . Occupation: Secondary school teacher  Tobacco Use  . Smoking status: Former Smoker    Packs/day: 1.00    Years: 20.00    Pack years: 20.00    Types: Cigarettes    Start date: 6    Quit date: 2001    Years since quitting: 21.3  . Smokeless tobacco: Never Used  Vaping Use  . Vaping Use: Never used  Substance and Sexual Activity  . Alcohol use: No  . Drug use: No  . Sexual activity: Yes    Birth control/protection: None, Post-menopausal  Other Topics Concern  . Not on file  Social History Narrative  . Not on file   Social Determinants of Health   Financial Resource Strain: High Risk  . Difficulty of Paying Living Expenses: Hard  Food Insecurity: No Food Insecurity  . Worried About Charity fundraiser in the Last Year: Never true  . Ran Out of Food in the Last Year: Never true  Transportation Needs: No Transportation Needs  . Lack of Transportation (Medical): No  . Lack of Transportation (Non-Medical): No  Physical Activity: Inactive  . Days of Exercise per Week: 0 days  . Minutes of Exercise per Session: 0 min  Stress: Not on file  Social Connections: Moderately Isolated  . Frequency of Communication with Friends and Family: Twice a week  . Frequency of Social Gatherings with Friends and Family: Twice a week  . Attends Religious Services: More than 4 times per year  . Active Member of Clubs or Organizations: No  . Attends Archivist Meetings: Not on file  . Marital Status: Separated  Intimate Partner Violence: Not on file    FAMILY HISTORY: Family History  Problem Relation Age of Onset  . Arthritis Mother   . Heart failure Father     ALLERGIES:  is allergic to  pseudoephedrine, gabapentin, mobic [meloxicam], and penicillins.  MEDICATIONS:  Current Outpatient Medications  Medication Sig Dispense Refill  . acetaminophen (TYLENOL) 500 MG tablet Take 1,000 mg by mouth every 6 (six) hours as needed for moderate pain.     Marland Kitchen albuterol (VENTOLIN HFA) 108 (90 Base) MCG/ACT inhaler Inhale 2 puffs into the lungs every 6 (six) hours as needed for wheezing or shortness of breath. 18 g 6  . Artificial Saliva (BIOTENE DRY MOUTH MOISTURIZING) SOLN Use as directed 1 Dose in the mouth or throat as needed (dry mouth).    . budesonide-formoterol (SYMBICORT) 80-4.5 MCG/ACT inhaler Inhale 2 puffs into the lungs in the morning and at bedtime. 1 each  11  . camphor-menthol (SARNA) lotion Apply 1 application topically as needed for itching.    . Carboxymethylcellul-Glycerin (LUBRICATING EYE DROPS OP) Place 1 drop into both eyes daily as needed (irritation).    . Carboxymethylcellulose Sodium (REFRESH CELLUVISC OP) Apply to eye.    . Cream Base (WOUND CARE) CREA Apply 1 application topically as needed (irritation.).    Marland Kitchen fexofenadine (ALLEGRA) 180 MG tablet Take 180 mg by mouth daily.    . folic acid (FOLVITE) 1 MG tablet Take 1 tablet (1 mg total) by mouth daily. 90 tablet 2  . guaiFENesin (MUCINEX) 600 MG 12 hr tablet Take 600 mg by mouth 2 (two) times daily.     Marland Kitchen HYDROcodone-acetaminophen (HYCET) 7.5-325 mg/15 ml solution Take 10 mLs by mouth 2 (two) times daily. For pain from cancer and radiation reactions (Patient taking differently: Take 10 mLs by mouth 2 (two) times daily as needed for moderate pain or severe pain. For pain from cancer and radiation reactions) 473 mL 0  . lidocaine (XYLOCAINE) 2 % solution Use as directed 15 mLs in the mouth or throat as needed for mouth pain. Swallow 30 min prior to meals and at bedtime for throat/esophagus pain (Patient not taking: No sig reported) 250 mL 1  . lidocaine-prilocaine (EMLA) cream Apply 1 application topically as needed.  (Patient taking differently: Apply 1 application topically as needed (port access).) 30 g 2  . LORazepam (ATIVAN) 0.5 MG tablet Please take 1 tablet 30 minutes before your scan as needed for anxiety 2 tablet 0  . LORazepam (ATIVAN) 2 MG tablet Take 1 tab 30 min before radiation treatment to the brain. (Patient not taking: No sig reported) 1 tablet 0  . Multiple Vitamin (MULTIVITAMIN WITH MINERALS) TABS tablet Take 1 tablet by mouth daily.    Marland Kitchen neomycin-polymyxin-hydrocortisone (CORTISPORIN) OTIC solution Apply 1-2 drops to toe after soaking BID (Patient taking differently: 1-2 drops See admin instructions. Apply 1-2 drops to toe after soaking twice daily) 10 mL 1  . OXYGEN Inhale 3 L/min into the lungs continuous.    . potassium chloride 20 MEQ/15ML (10%) SOLN Take 15 mLs (20 mEq total) by mouth 2 (two) times daily. (Patient not taking: No sig reported) 210 mL 0  . prochlorperazine (COMPAZINE) 10 MG tablet Take 1 tablet (10 mg total) by mouth every 6 (six) hours as needed. (Patient not taking: No sig reported) 30 tablet 2  . sucralfate (CARAFATE) 1 g tablet Take 1 tablet (1 g total) by mouth 4 (four) times daily -  with meals and at bedtime. (Patient not taking: No sig reported) 90 tablet 2  . triamcinolone ointment (KENALOG) 0.1 % Apply 1 application topically in the morning and at bedtime.     No current facility-administered medications for this visit.    REVIEW OF SYSTEMS:   Constitutional: ( - ) fevers, ( - )  chills , ( - ) night sweats Eyes: ( - ) blurriness of vision, ( - ) double vision, ( - ) watery eyes Ears, nose, mouth, throat, and face: ( - ) mucositis, ( - ) sore throat Respiratory: ( - ) cough, ( - ) dyspnea, ( - ) wheezes Cardiovascular: ( - ) palpitation, ( - ) chest discomfort, ( - ) lower extremity swelling Gastrointestinal:  ( - ) nausea, ( - ) heartburn, ( - ) change in bowel habits Skin: ( - ) abnormal skin rashes Lymphatics: ( - ) new lymphadenopathy, ( - ) easy  bruising Neurological: ( - )  numbness, ( - ) tingling, ( - ) new weaknesses Behavioral/Psych: ( - ) mood change, ( - ) new changes  All other systems were reviewed with the patient and are negative.  PHYSICAL EXAMINATION: ECOG PERFORMANCE STATUS: 1 - Symptomatic but completely ambulatory  Vitals:   07/25/20 1228  BP: (!) 162/80  Pulse: 84  Resp: 17  Temp: 97.8 F (36.6 C)  SpO2: 100%   Filed Weights   07/25/20 1228  Weight: 150 lb 3.2 oz (68.1 kg)    GENERAL: well appearing middle aged [place] American female. alert, no distress and comfortable SKIN: skin color, texture, turgor are normal, no rashes or significant lesions EYES: conjunctiva are pink and non-injected, sclera clear LUNGS: clear to auscultation and percussion with normal breathing effort HEART: regular rate & rhythm and no murmurs and no lower extremity edema Musculoskeletal: no cyanosis of digits and no clubbing  PSYCH: alert & oriented x 3, fluent speech NEURO: no focal motor/sensory deficits  LABORATORY DATA:  I have reviewed the data as listed CBC Latest Ref Rng & Units 07/25/2020 07/04/2020 06/13/2020  WBC 4.0 - 10.5 K/uL 2.8(L) 4.2 4.5  Hemoglobin 12.0 - 15.0 g/dL 9.1(L) 9.7(L) 9.8(L)  Hematocrit 36.0 - 46.0 % 28.6(L) 30.3(L) 31.2(L)  Platelets 150 - 400 K/uL 148(L) 202 199    CMP Latest Ref Rng & Units 07/25/2020 07/04/2020 06/13/2020  Glucose 70 - 99 mg/dL 83 84 117(H)  BUN 8 - 23 mg/dL 7(L) 8 9  Creatinine 0.44 - 1.00 mg/dL 0.86 0.87 0.92  Sodium 135 - 145 mmol/L 144 145 145  Potassium 3.5 - 5.1 mmol/L 3.4(L) 3.4(L) 3.6  Chloride 98 - 111 mmol/L 104 105 107  CO2 22 - 32 mmol/L 29 30 29   Calcium 8.9 - 10.3 mg/dL 8.9 8.7(L) 9.0  Total Protein 6.5 - 8.1 g/dL 6.4(L) 6.6 6.5  Total Bilirubin 0.3 - 1.2 mg/dL 0.3 <0.2(L) 0.2(L)  Alkaline Phos 38 - 126 U/L 46 58 49  AST 15 - 41 U/L 37 25 27  ALT 0 - 44 U/L 18 17 21     RADIOGRAPHIC STUDIES: CT CHEST ABDOMEN PELVIS W CONTRAST  Result Date:  07/17/2020 CLINICAL DATA:  Metastatic non-small cell lung cancer. Assess treatment response. EXAM: CT CHEST, ABDOMEN, AND PELVIS WITH CONTRAST TECHNIQUE: Multidetector CT imaging of the chest, abdomen and pelvis was performed following the standard protocol during bolus administration of intravenous contrast. CONTRAST:  137mL OMNIPAQUE IOHEXOL 300 MG/ML  SOLN COMPARISON:  Prior CTs 03/22/2020.  Chest CTA 04/12/2020. FINDINGS: CT CHEST FINDINGS Cardiovascular: Right IJ Port-A-Cath extends to the level of the inferior right atrium, unchanged. Atherosclerosis of the aorta and great vessels without acute vascular findings. The heart size is normal. There is no pericardial effusion. Mediastinum/Nodes: Interval improved soft tissue thickening in the superior mediastinum and subcarinal regions without residual or recurrent discrete lymphadenopathy. Possible mild mid esophageal wall thickening which may be treatment related. The thyroid gland and trachea appear unremarkable. Lungs/Pleura: There is no pleural effusion or pneumothorax. Moderate centrilobular and paraseptal emphysema. The previously demonstrated right infrahilar mass is obscured by progressive radiation changes in the right perihilar region with associated architectural distortion and central bronchiectasis. Radiation changes at the lung apices have mildly improved compared with the most recent study, and there is improved aeration of the left lower lobe. No new or enlarging pulmonary nodules identified. Musculoskeletal/Chest wall: No chest wall mass or suspicious osseous findings. CT ABDOMEN AND PELVIS FINDINGS Hepatobiliary: The liver is normal in density without suspicious focal  abnormality. No evidence of gallstones, gallbladder wall thickening or biliary dilatation. Pancreas: Unremarkable. No pancreatic ductal dilatation or surrounding inflammatory changes. Spleen: Normal in size without focal abnormality. Adrenals/Urinary Tract: Stable probable small  left adrenal adenoma. The right adrenal gland appears normal. The kidneys appear normal without evidence of urinary tract calculus, suspicious lesion or hydronephrosis. No bladder abnormalities are seen. Stomach/Bowel: Enteric contrast was administered and has passed into the distal colon. The stomach appears unremarkable for its degree of distension. No evidence of bowel wall thickening, distention or surrounding inflammatory change. The appendix appears normal. Vascular/Lymphatic: There are no enlarged abdominal or pelvic lymph nodes. Aortic and branch vessel atherosclerosis without acute vascular findings. Reproductive: Fibroid uterus.  No adnexal mass. Other: No ascites or peritoneal nodularity.  Intact abdominal wall. Musculoskeletal: No acute or significant osseous findings. Degenerative disc disease at L5-S1. IMPRESSION: 1. Resolved mediastinal adenopathy with improved soft tissue thickening in the superior mediastinum and subcarinal regions. 2. Evolving radiation changes in the right perihilar region and both lung apices. The right infrahilar mass is obscured, but there is no evidence of local recurrence or progressive metastatic disease. 3. Possible mild wall thickening of the midesophagus, likely treatment related. 4. No significant findings in the abdomen or pelvis. 5. Fibroid uterus. Aortic Atherosclerosis (ICD10-I70.0). Emphysema (ICD10-J43.9). Electronically Signed   By: Richardean Sale M.D.   On: 07/17/2020 09:14    ASSESSMENT & PLAN Elizabeth Mayer 60 y.o. female with medical history significant for metastatic adenocarcinoma of the lung who presents for follow up.    After review the labs, the records, discussion with the patient the findings are most consistent with metastatic adenocarcinoma the lung without any targetable mutations.  The patient has been started on carboplatin pemetrexed pembrolizumab every 3 weeks with Dr. Julien Nordmann, however the patient has expressed a desire to transition to  another provider.  As such we were happy to take up her care here.  She already discontinued the carboplatin and is on maintenance pemetrexed/pembrolizumab therapy. We assumed her care with Cycle 6 on 04/04/2020. Today is Cycle 11 Day 1.     After discussion with the patient and review of her blood work she appears appropriate for continued treatment at this time.  # Metastatic Adenocarcinoma of the Lung #Brain Metastasis --repeat scans showed the patient is having an appropriate response to treatment with Carbo/Pem/Pem chemotherapy. Next scan due early July 2022.   --continue with maintenance Pem/Pem, Cycle 11 Day 1 today --no targetable mutations noted on prior workup. --continue to follow with Dr. Laqueta Due Onc for metastatic spread to the brain.  --can consider docetaxel/ramucirumab at time of progression.  --RTC in 3 weeks for clinic visit and continued chemo treatments on 08/15/2020  No orders of the defined types were placed in this encounter.   All questions were answered. The patient knows to call the clinic with any problems, questions or concerns.  A total of more than 30 minutes were spent on this encounter and over half of that time was spent on counseling and coordination of care as outlined above.   Ledell Peoples, MD Department of Hematology/Oncology [place] [facility] Phone: [phone] Pager: [phone] Email: [email or web address]  07/27/2020 2:15 PM

## 2020-07-28 DIAGNOSIS — J449 Chronic obstructive pulmonary disease, unspecified: Secondary | ICD-10-CM | POA: Diagnosis not present

## 2020-07-29 DIAGNOSIS — J449 Chronic obstructive pulmonary disease, unspecified: Secondary | ICD-10-CM | POA: Diagnosis not present

## 2020-07-29 DIAGNOSIS — C3491 Malignant neoplasm of unspecified part of right bronchus or lung: Secondary | ICD-10-CM | POA: Diagnosis not present

## 2020-08-08 ENCOUNTER — Telehealth (HOSPITAL_COMMUNITY): Payer: Self-pay

## 2020-08-14 NOTE — Progress Notes (Signed)
[place] [phone]   [phone]  PROGRESS NOTE  Patient Care Team: Minette Brine, FNP as PCP - General (General Practice)  Hematological/Oncological History # Metastatic Adenocarcinoma of the Lung #Brain Metastasis 1) 11/08/2019: establish care with Dr. Julien Nordmann and his PA Cassie Heilingoetter. Noted to have widely metastatic lung cancer with involvement of the brain, lymph nodes, and right lung.  2) 12/04/2019: palliative radiation to the right lung mass/cervical adenopathy 3) 9/7/02021: started therapy with Carbo/Pem/Pem 4) 9/29-10/09/2019: patient underwent SRS to the brain mets 5) 03/22/2020: repeat CT C/A/P and neck scheduled. Transfer care to Dr. Lorenso Courier.   6) 03/22/2020: CT C/A/P showed interval significant improvement in previously demonstrated extensive confluent lymphadenopathy in the superior mediastinum and right hilar regions. 7) 08/15/2020: Holding chemotherapy due to poor Hgb and fatigue. Allowing patient a brief 'chemo holiday' to rebound appropriately from last cycle.   Interval History:  Elizabeth Mayer 60 y.o. female with medical history significant for metastatic adenocarcinoma of the lung who presents for follow up. The patient's last visit was on 07/04/2020. In there interim she completed Cycle 11 of Pem/Pem maintenance.   On exam today Elizabeth Mayer notes she feels a lot more wiped out than usual.  She reports that her energy levels have been low for the past 3 weeks.  She notes that her eating has been fair and she has been "fighting with her appetite" and force herself to eat because she does not wish to lose weight.  She notes that she does not always have the "get up and go" necessary to do the things she needs to do.  She reports that she feels cold and also having a pain in her left ankle which has been worsening.  She otherwise denies any nausea, vomiting, or diarrhea.  She is on 3 L of oxygen and notes that this level has been  stable.  Otherwise she has not noticed any side effects as a result of her chemotherapy treatment.  She has requested a brief break and holding on treatment today.  A full 10 point ROS is listed below.  MEDICAL HISTORY:  Past Medical History:  Diagnosis Date  . Anemia   . Angina 1982   related to stress  . Asthma    in the past,  no current problems  . Complication of anesthesia    states anesthesia made her hair fall out  . Constipation   . Dyspnea    on oxygen at home - 2L via [place]  . Fatigue   . GERD (gastroesophageal reflux disease)    patient denies this dx  . Headache   . Heart murmur 1970s   no problems currently  . Ingrown toenail   . Lung cancer ([place]) 10/2019   metastatic disease to the brain  . On home oxygen therapy    2.2 lpm, 24 hours a day  . Past heart attack 1980-1981   pt states she passed out and woke up in hospital- told she had heart attack, but then dr said he couldn't find anything wrong.  . Pneumonia    x 1    SURGICAL HISTORY: Past Surgical History:  Procedure Laterality Date  . CERVICAL DISC SURGERY  2000   Disc removed from neck   . ingrown toe nail surgery Bilateral   . IR IMAGING GUIDED PORT INSERTION  02/16/2020  . MULTIPLE TOOTH EXTRACTIONS     for braces  . RADIOLOGY WITH ANESTHESIA N/A 12/05/2019   Procedure: MRI  BRAIN WITH AND WITHOUT CONTRAST;  Surgeon: Radiologist, Medication, MD;  Location: [facility];  Service: Radiology;  Laterality: N/A;  . RADIOLOGY WITH ANESTHESIA N/A 01/02/2020   Procedure: MRI BRAIN WITH AND WITHOUT CONTRAST;  Surgeon: Radiologist, Medication, MD;  Location: [facility];  Service: Radiology;  Laterality: N/A;  . RADIOLOGY WITH ANESTHESIA N/A 02/20/2020   Procedure: MRI WITH ANESTHESIA OF BRAIN WITH AND WITHOUT CONTRAST;  Surgeon: Radiologist, Medication, MD;  Location: [facility];  Service: Radiology;  Laterality: N/A;  . RADIOLOGY WITH ANESTHESIA N/A 06/11/2020   Procedure: MRI WITH ANESTHESIA OF BRAIN WITH AND WITHOUT CONTRAST;   Surgeon: Radiologist, Medication, MD;  Location: [facility];  Service: Radiology;  Laterality: N/A;  . TONSILLECTOMY      SOCIAL HISTORY: Social History   Socioeconomic History  . Marital status: Married    Spouse name: Not on file  . Number of children: 0  . Years of education: Not on file  . Highest education level: Not on file  Occupational History  . Occupation: Secondary school teacher  Tobacco Use  . Smoking status: Former Smoker    Packs/day: 1.00    Years: 20.00    Pack years: 20.00    Types: Cigarettes    Start date: 56    Quit date: 2001    Years since quitting: 21.3  . Smokeless tobacco: Never Used  Vaping Use  . Vaping Use: Never used  Substance and Sexual Activity  . Alcohol use: No  . Drug use: No  . Sexual activity: Yes    Birth control/protection: None, Post-menopausal  Other Topics Concern  . Not on file  Social History Narrative  . Not on file   Social Determinants of Health   Financial Resource Strain: High Risk  . Difficulty of Paying Living Expenses: Hard  Food Insecurity: No Food Insecurity  . Worried About Charity fundraiser in the Last Year: Never true  . Ran Out of Food in the Last Year: Never true  Transportation Needs: No Transportation Needs  . Lack of Transportation (Medical): No  . Lack of Transportation (Non-Medical): No  Physical Activity: Inactive  . Days of Exercise per Week: 0 days  . Minutes of Exercise per Session: 0 min  Stress: Not on file  Social Connections: Moderately Isolated  . Frequency of Communication with Friends and Family: Twice a week  . Frequency of Social Gatherings with Friends and Family: Twice a week  . Attends Religious Services: More than 4 times per year  . Active Member of Clubs or Organizations: No  . Attends Archivist Meetings: Not on file  . Marital Status: Separated  Intimate Partner Violence: Not on file    FAMILY HISTORY: Family History  Problem Relation Age of Onset  . Arthritis Mother    . Heart failure Father     ALLERGIES:  is allergic to pseudoephedrine, gabapentin, mobic [meloxicam], and penicillins.  MEDICATIONS:  Current Outpatient Medications  Medication Sig Dispense Refill  . acetaminophen (TYLENOL) 500 MG tablet Take 1,000 mg by mouth every 6 (six) hours as needed for moderate pain.     Marland Kitchen albuterol (VENTOLIN HFA) 108 (90 Base) MCG/ACT inhaler Inhale 2 puffs into the lungs every 6 (six) hours as needed for wheezing or shortness of breath. 18 g 6  . Artificial Saliva (BIOTENE DRY MOUTH MOISTURIZING) SOLN Use as directed 1 Dose in the mouth or throat as needed (dry mouth).    . budesonide-formoterol (SYMBICORT) 80-4.5 MCG/ACT inhaler Inhale 2 puffs  into the lungs in the morning and at bedtime. 1 each 11  . camphor-menthol (SARNA) lotion Apply 1 application topically as needed for itching.    . Carboxymethylcellul-Glycerin (LUBRICATING EYE DROPS OP) Place 1 drop into both eyes daily as needed (irritation).    . Carboxymethylcellulose Sodium (REFRESH CELLUVISC OP) Apply to eye.    . Cream Base (WOUND CARE) CREA Apply 1 application topically as needed (irritation.).    Marland Kitchen fexofenadine (ALLEGRA) 180 MG tablet Take 180 mg by mouth daily.    . folic acid (FOLVITE) 1 MG tablet Take 1 tablet (1 mg total) by mouth daily. 90 tablet 2  . guaiFENesin (MUCINEX) 600 MG 12 hr tablet Take 600 mg by mouth 2 (two) times daily.     Marland Kitchen HYDROcodone-acetaminophen (HYCET) 7.5-325 mg/15 ml solution Take 10 mLs by mouth 2 (two) times daily. For pain from cancer and radiation reactions (Patient taking differently: Take 10 mLs by mouth 2 (two) times daily as needed for moderate pain or severe pain. For pain from cancer and radiation reactions) 473 mL 0  . lidocaine (XYLOCAINE) 2 % solution Use as directed 15 mLs in the mouth or throat as needed for mouth pain. Swallow 30 min prior to meals and at bedtime for throat/esophagus pain (Patient not taking: No sig reported) 250 mL 1  . lidocaine-prilocaine  (EMLA) cream Apply 1 application topically as needed. (Patient taking differently: Apply 1 application topically as needed (port access).) 30 g 2  . LORazepam (ATIVAN) 0.5 MG tablet Please take 1 tablet 30 minutes before your scan as needed for anxiety 2 tablet 0  . LORazepam (ATIVAN) 2 MG tablet Take 1 tab 30 min before radiation treatment to the brain. (Patient not taking: No sig reported) 1 tablet 0  . Multiple Vitamin (MULTIVITAMIN WITH MINERALS) TABS tablet Take 1 tablet by mouth daily.    Marland Kitchen neomycin-polymyxin-hydrocortisone (CORTISPORIN) OTIC solution Apply 1-2 drops to toe after soaking BID (Patient taking differently: 1-2 drops See admin instructions. Apply 1-2 drops to toe after soaking twice daily) 10 mL 1  . OXYGEN Inhale 3 L/min into the lungs continuous.    . potassium chloride 20 MEQ/15ML (10%) SOLN Take 15 mLs (20 mEq total) by mouth 2 (two) times daily. (Patient not taking: No sig reported) 210 mL 0  . prochlorperazine (COMPAZINE) 10 MG tablet Take 1 tablet (10 mg total) by mouth every 6 (six) hours as needed. (Patient not taking: No sig reported) 30 tablet 2  . sucralfate (CARAFATE) 1 g tablet Take 1 tablet (1 g total) by mouth 4 (four) times daily -  with meals and at bedtime. (Patient not taking: No sig reported) 90 tablet 2  . triamcinolone ointment (KENALOG) 0.1 % Apply 1 application topically in the morning and at bedtime.     No current facility-administered medications for this visit.    REVIEW OF SYSTEMS:   Constitutional: ( - ) fevers, ( - )  chills , ( - ) night sweats Eyes: ( - ) blurriness of vision, ( - ) double vision, ( - ) watery eyes Ears, nose, mouth, throat, and face: ( - ) mucositis, ( - ) sore throat Respiratory: ( - ) cough, ( - ) dyspnea, ( - ) wheezes Cardiovascular: ( - ) palpitation, ( - ) chest discomfort, ( - ) lower extremity swelling Gastrointestinal:  ( - ) nausea, ( - ) heartburn, ( - ) change in bowel habits Skin: ( - ) abnormal skin  rashes Lymphatics: ( - )  new lymphadenopathy, ( - ) easy bruising Neurological: ( - ) numbness, ( - ) tingling, ( - ) new weaknesses Behavioral/Psych: ( - ) mood change, ( - ) new changes  All other systems were reviewed with the patient and are negative.  PHYSICAL EXAMINATION: ECOG PERFORMANCE STATUS: 1 - Symptomatic but completely ambulatory  There were no vitals filed for this visit. There were no vitals filed for this visit.  GENERAL: well appearing middle aged [place] American female. alert, no distress and comfortable SKIN: skin color, texture, turgor are normal, no rashes or significant lesions EYES: conjunctiva are pink and non-injected, sclera clear LUNGS: clear to auscultation and percussion with normal breathing effort HEART: regular rate & rhythm and no murmurs and no lower extremity edema Musculoskeletal: no cyanosis of digits and no clubbing  PSYCH: alert & oriented x 3, fluent speech NEURO: no focal motor/sensory deficits  LABORATORY DATA:  I have reviewed the data as listed CBC Latest Ref Rng & Units 07/25/2020 07/04/2020 06/13/2020  WBC 4.0 - 10.5 K/uL 2.8(L) 4.2 4.5  Hemoglobin 12.0 - 15.0 g/dL 9.1(L) 9.7(L) 9.8(L)  Hematocrit 36.0 - 46.0 % 28.6(L) 30.3(L) 31.2(L)  Platelets 150 - 400 K/uL 148(L) 202 199    CMP Latest Ref Rng & Units 07/25/2020 07/04/2020 06/13/2020  Glucose 70 - 99 mg/dL 83 84 117(H)  BUN 8 - 23 mg/dL 7(L) 8 9  Creatinine 0.44 - 1.00 mg/dL 0.86 0.87 0.92  Sodium 135 - 145 mmol/L 144 145 145  Potassium 3.5 - 5.1 mmol/L 3.4(L) 3.4(L) 3.6  Chloride 98 - 111 mmol/L 104 105 107  CO2 22 - 32 mmol/L 29 30 29   Calcium 8.9 - 10.3 mg/dL 8.9 8.7(L) 9.0  Total Protein 6.5 - 8.1 g/dL 6.4(L) 6.6 6.5  Total Bilirubin 0.3 - 1.2 mg/dL 0.3 <0.2(L) 0.2(L)  Alkaline Phos 38 - 126 U/L 46 58 49  AST 15 - 41 U/L 37 25 27  ALT 0 - 44 U/L 18 17 21     RADIOGRAPHIC STUDIES: No results found.  ASSESSMENT & PLAN Janda A Knotek 60 y.o. female with medical history  significant for metastatic adenocarcinoma of the lung who presents for follow up.    After review the labs, the records, discussion with the patient the findings are most consistent with metastatic adenocarcinoma the lung without any targetable mutations.  The patient has been started on carboplatin pemetrexed pembrolizumab every 3 weeks with Dr. Julien Nordmann, however the patient has expressed a desire to transition to another provider.  As such we were happy to take up her care here.  She already discontinued the carboplatin and is on maintenance pemetrexed/pembrolizumab therapy. We assumed her care with Cycle 6 on 04/04/2020. Today was to be Cycle 12 Day 1, however we are holding due to fatigue, anemia, and failure of her labs to rebound from prior cycle.     After discussion with the patient and review of her blood work she appears appropriate for continued treatment at this time.  # Metastatic Adenocarcinoma of the Lung #Brain Metastasis --repeat scans showed the patient is having an appropriate response to treatment with Carbo/Pem/Pem chemotherapy. Next scan due early July 2022.   --continue with maintenance Pem/Pem, HOLDING Cycle 12 Day 1 . Plan to restart/readdress in 2 weeks time --no targetable mutations noted on prior workup. --continue to follow with Dr. Laqueta Due Onc for metastatic spread to the brain.  --can consider docetaxel/ramucirumab at time of progression.  --RTC in 2 weeks for clinic visit and continued chemo  treatments on 08/29/2020  No orders of the defined types were placed in this encounter.   All questions were answered. The patient knows to call the clinic with any problems, questions or concerns.  A total of more than 30 minutes were spent on this encounter and over half of that time was spent on counseling and coordination of care as outlined above.   Ledell Peoples, MD Department of Hematology/Oncology [place] at [facility] Phone:  [phone] Pager: [phone] Email: [email or web address]  08/14/2020 8:39 PM

## 2020-08-15 ENCOUNTER — Inpatient Hospital Stay: Payer: BC Managed Care – PPO

## 2020-08-15 ENCOUNTER — Other Ambulatory Visit: Payer: Self-pay

## 2020-08-15 ENCOUNTER — Encounter: Payer: Self-pay | Admitting: Hematology and Oncology

## 2020-08-15 ENCOUNTER — Inpatient Hospital Stay: Payer: BC Managed Care – PPO | Attending: Internal Medicine | Admitting: Hematology and Oncology

## 2020-08-15 ENCOUNTER — Other Ambulatory Visit: Payer: BC Managed Care – PPO

## 2020-08-15 VITALS — BP 161/74 | HR 82 | Temp 98.7°F | Resp 18 | Wt 149.8 lb

## 2020-08-15 DIAGNOSIS — Z5111 Encounter for antineoplastic chemotherapy: Secondary | ICD-10-CM | POA: Diagnosis not present

## 2020-08-15 DIAGNOSIS — Z5112 Encounter for antineoplastic immunotherapy: Secondary | ICD-10-CM | POA: Diagnosis not present

## 2020-08-15 DIAGNOSIS — C3491 Malignant neoplasm of unspecified part of right bronchus or lung: Secondary | ICD-10-CM | POA: Diagnosis not present

## 2020-08-15 DIAGNOSIS — Z79899 Other long term (current) drug therapy: Secondary | ICD-10-CM | POA: Insufficient documentation

## 2020-08-15 DIAGNOSIS — Z95828 Presence of other vascular implants and grafts: Secondary | ICD-10-CM

## 2020-08-15 DIAGNOSIS — Z7951 Long term (current) use of inhaled steroids: Secondary | ICD-10-CM | POA: Diagnosis not present

## 2020-08-15 DIAGNOSIS — C7931 Secondary malignant neoplasm of brain: Secondary | ICD-10-CM | POA: Diagnosis not present

## 2020-08-15 DIAGNOSIS — Z9981 Dependence on supplemental oxygen: Secondary | ICD-10-CM | POA: Diagnosis not present

## 2020-08-15 DIAGNOSIS — Z87891 Personal history of nicotine dependence: Secondary | ICD-10-CM | POA: Diagnosis not present

## 2020-08-15 LAB — CBC WITH DIFFERENTIAL (CANCER CENTER ONLY)
Abs Immature Granulocytes: 0.01 10*3/uL (ref 0.00–0.07)
Basophils Absolute: 0 10*3/uL (ref 0.0–0.1)
Basophils Relative: 1 %
Eosinophils Absolute: 0.1 10*3/uL (ref 0.0–0.5)
Eosinophils Relative: 4 %
HCT: 26.9 % — ABNORMAL LOW (ref 36.0–46.0)
Hemoglobin: 8.7 g/dL — ABNORMAL LOW (ref 12.0–15.0)
Immature Granulocytes: 0 %
Lymphocytes Relative: 19 %
Lymphs Abs: 0.6 10*3/uL — ABNORMAL LOW (ref 0.7–4.0)
MCH: 30.6 pg (ref 26.0–34.0)
MCHC: 32.3 g/dL (ref 30.0–36.0)
MCV: 94.7 fL (ref 80.0–100.0)
Monocytes Absolute: 0.6 10*3/uL (ref 0.1–1.0)
Monocytes Relative: 17 %
Neutro Abs: 2 10*3/uL (ref 1.7–7.7)
Neutrophils Relative %: 59 %
Platelet Count: 179 10*3/uL (ref 150–400)
RBC: 2.84 MIL/uL — ABNORMAL LOW (ref 3.87–5.11)
RDW: 15.1 % (ref 11.5–15.5)
WBC Count: 3.4 10*3/uL — ABNORMAL LOW (ref 4.0–10.5)
nRBC: 0 % (ref 0.0–0.2)

## 2020-08-15 LAB — CMP (CANCER CENTER ONLY)
ALT: 14 U/L (ref 0–44)
AST: 27 U/L (ref 15–41)
Albumin: 3.2 g/dL — ABNORMAL LOW (ref 3.5–5.0)
Alkaline Phosphatase: 48 U/L (ref 38–126)
Anion gap: 9 (ref 5–15)
BUN: 7 mg/dL — ABNORMAL LOW (ref 8–23)
CO2: 31 mmol/L (ref 22–32)
Calcium: 8.9 mg/dL (ref 8.9–10.3)
Chloride: 103 mmol/L (ref 98–111)
Creatinine: 1.16 mg/dL — ABNORMAL HIGH (ref 0.44–1.00)
GFR, Estimated: 54 mL/min — ABNORMAL LOW (ref >60)
Glucose, Bld: 79 mg/dL (ref 70–99)
Potassium: 2.9 mmol/L — ABNORMAL LOW (ref 3.5–5.1)
Sodium: 143 mmol/L (ref 135–145)
Total Bilirubin: <0.2 mg/dL — ABNORMAL LOW (ref 0.3–1.2)
Total Protein: 6.5 g/dL (ref 6.5–8.1)

## 2020-08-15 LAB — MAGNESIUM: Magnesium: 1.6 mg/dL — ABNORMAL LOW (ref 1.7–2.4)

## 2020-08-15 LAB — TSH: TSH: 1.701 u[IU]/mL (ref 0.308–3.960)

## 2020-08-15 MED ORDER — SODIUM CHLORIDE 0.9% FLUSH
10.0000 mL | Freq: Once | INTRAVENOUS | Status: AC
Start: 2020-08-15 — End: 2020-08-15
  Administered 2020-08-15: 10 mL
  Filled 2020-08-15: qty 10

## 2020-08-15 MED ORDER — HEPARIN SOD (PORK) LOCK FLUSH 100 UNIT/ML IV SOLN
500.0000 [IU] | Freq: Once | INTRAVENOUS | Status: AC
  Administered 2020-08-15: 500 [IU]
  Filled 2020-08-15: qty 5

## 2020-08-15 NOTE — Patient Instructions (Signed)

## 2020-08-16 ENCOUNTER — Other Ambulatory Visit: Payer: Self-pay | Admitting: Hematology and Oncology

## 2020-08-16 DIAGNOSIS — E876 Hypokalemia: Secondary | ICD-10-CM

## 2020-08-16 MED ORDER — POTASSIUM CHLORIDE 20 MEQ/15ML (10%) PO SOLN: 20.0000 meq | Freq: Two times a day (BID) | ORAL | 0 refills | Status: DC

## 2020-08-22 ENCOUNTER — Telehealth: Payer: Self-pay | Admitting: *Deleted

## 2020-08-22 NOTE — Telephone Encounter (Signed)
Received vm message from patient. She states she is having some sinus congestion, chest congestion and also has a boil on her bottom between her vagina and her anus.  Denies fevers but states she has had some chills. Discussed with Dr. Lorenso Courier and he wants her to be seen by her PCP about this issues.  Spoke with patient and advised her of the above. She states she will call her PCP in the morning.

## 2020-08-25 ENCOUNTER — Encounter: Payer: Self-pay | Admitting: Nurse Practitioner

## 2020-08-26 ENCOUNTER — Encounter: Payer: Self-pay | Admitting: Nurse Practitioner

## 2020-08-26 ENCOUNTER — Other Ambulatory Visit: Payer: Self-pay

## 2020-08-26 ENCOUNTER — Ambulatory Visit (INDEPENDENT_AMBULATORY_CARE_PROVIDER_SITE_OTHER): Payer: BC Managed Care – PPO | Admitting: Nurse Practitioner

## 2020-08-26 VITALS — BP 134/76 | HR 87 | Temp 98.0°F | Ht 64.4 in | Wt 146.8 lb

## 2020-08-26 DIAGNOSIS — R350 Frequency of micturition: Secondary | ICD-10-CM | POA: Diagnosis not present

## 2020-08-26 DIAGNOSIS — R1011 Right upper quadrant pain: Secondary | ICD-10-CM | POA: Diagnosis not present

## 2020-08-26 DIAGNOSIS — Z1231 Encounter for screening mammogram for malignant neoplasm of breast: Secondary | ICD-10-CM

## 2020-08-26 DIAGNOSIS — E876 Hypokalemia: Secondary | ICD-10-CM | POA: Diagnosis not present

## 2020-08-26 DIAGNOSIS — D72820 Lymphocytosis (symptomatic): Secondary | ICD-10-CM

## 2020-08-26 DIAGNOSIS — R42 Dizziness and giddiness: Secondary | ICD-10-CM

## 2020-08-26 DIAGNOSIS — Z114 Encounter for screening for human immunodeficiency virus [HIV]: Secondary | ICD-10-CM

## 2020-08-26 DIAGNOSIS — C7931 Secondary malignant neoplasm of brain: Secondary | ICD-10-CM

## 2020-08-26 DIAGNOSIS — C3491 Malignant neoplasm of unspecified part of right bronchus or lung: Secondary | ICD-10-CM

## 2020-08-26 LAB — POCT URINALYSIS DIPSTICK
Bilirubin, UA: NEGATIVE
Blood, UA: NEGATIVE
Glucose, UA: NEGATIVE
Ketones, UA: NEGATIVE
Leukocytes, UA: NEGATIVE
Nitrite, UA: NEGATIVE
Protein, UA: NEGATIVE
Spec Grav, UA: 1.015 (ref 1.010–1.025)
Urobilinogen, UA: 0.2 E.U./dL
pH, UA: 6.5 (ref 5.0–8.0)

## 2020-08-26 MED ORDER — NITROFURANTOIN MONOHYD MACRO 100 MG PO CAPS: 100.0000 mg | ORAL_CAPSULE | Freq: Two times a day (BID) | ORAL | 0 refills | Status: AC

## 2020-08-26 NOTE — Progress Notes (Addendum)
I,Yamilka Roman [employer] as a Education administrator for [employer], FNP.,have documented all relevant documentation on the behalf of Minette Brine, FNP,as directed by  Minette Brine, FNP while in the presence of Minette Brine, [place]. This visit occurred during the SARS-CoV-2 public health emergency.  Safety protocols were in place, including screening questions prior to the visit, additional usage of staff PPE, and extensive cleaning of exam room while observing appropriate contact time as indicated for disinfecting solutions.  Subjective:     Patient ID: Elizabeth Mayer , female    DOB: 04-Mar-1961 , 60 y.o.   MRN: [id number]   Chief Complaint  Patient presents with   Abdominal Pain    HPI  She feels like she has a urinary tract infection - she has dizziness, dull abdomen pain. Reports she always uses the bathroom all the time. She is drinking water to flush the chemo.   Wt Readings from Last 3 Encounters: 08/26/20 : 146 lb 12.8 oz (66.6 kg) 08/15/20 : 149 lb 12.8 oz (67.9 kg) 07/25/20 : 150 lb 3.2 oz (68.1 kg)  She has been off the chemo for 2 weeks and the tumor began growing again. Reports after taking a laxative at the end of the stool was phlegm. Denies diarrhea.   Abdominal Pain This is a recurrent problem. The current episode started in the past 7 days. The onset quality is gradual. The problem occurs intermittently. The problem has been unchanged. The pain is located in the RUQ. The pain is at a severity of 8/10. The quality of the pain is dull. Associated symptoms include frequency. Pertinent negatives include no fever, headaches, nausea or vomiting. The pain is aggravated by movement. The pain is relieved by Nothing.    Past Medical History:  Diagnosis Date   Anemia    Angina 1982   related to stress   Asthma    in the past,  no current problems   Complication of anesthesia    states anesthesia made her hair fall out   Constipation    Dyspnea    on oxygen at home - 2L via [place]    Fatigue    GERD (gastroesophageal reflux disease)    patient denies this dx   Headache    Heart murmur 1970s   no problems currently   Ingrown toenail    Lung cancer ([place]) 10/2019   metastatic disease to the brain   On home oxygen therapy    2.2 lpm, 24 hours a day   Past heart attack 1980-1981   pt states she passed out and woke up in hospital- told she had heart attack, but then dr said he couldn't find anything wrong.   Pneumonia    x 1     Family History  Problem Relation Age of Onset   Arthritis Mother    Heart failure Father      Current Outpatient Medications:    acetaminophen (TYLENOL) 500 MG tablet, Take 1,000 mg by mouth every 6 (six) hours as needed for moderate pain. , Disp: , Rfl:    albuterol (VENTOLIN HFA) 108 (90 Base) MCG/ACT inhaler, Inhale 2 puffs into the lungs every 6 (six) hours as needed for wheezing or shortness of breath., Disp: 18 g, Rfl: 6   budesonide-formoterol (SYMBICORT) 80-4.5 MCG/ACT inhaler, Inhale 2 puffs into the lungs in the morning and at bedtime., Disp: 1 each, Rfl: 11   camphor-menthol (SARNA) lotion, Apply 1 application topically as needed for itching., Disp: , Rfl:  Carboxymethylcellulose Sodium (REFRESH CELLUVISC OP), Place 1-2 drops into both eyes See admin instructions. Instill 1 drop into both eyes twice daily (scheduled) & then use as needed throughout the day for dry/irritated eyes, Disp: , Rfl:    fexofenadine (ALLEGRA) 180 MG tablet, Take 180 mg by mouth in the morning., Disp: , Rfl:    folic acid (FOLVITE) 1 MG tablet, Take 1 tablet (1 mg total) by mouth daily. (Patient taking differently: Take 1 mg by mouth in the morning.), Disp: 90 tablet, Rfl: 2   guaiFENesin (MUCINEX) 600 MG 12 hr tablet, Take 600 mg by mouth 2 (two) times daily. , Disp: , Rfl:    HYDROcodone-acetaminophen (HYCET) 7.5-325 mg/15 ml solution, Take 10 mLs by mouth 2 (two) times daily. For pain from cancer and radiation reactions (Patient taking differently: Take  10 mLs by mouth 2 (two) times daily as needed for moderate pain or severe pain. For pain from cancer and radiation reactions), Disp: 473 mL, Rfl: 0   lidocaine (XYLOCAINE) 2 % solution, Use as directed 15 mLs in the mouth or throat as needed for mouth pain. Swallow 30 min prior to meals and at bedtime for throat/esophagus pain (Patient not taking: Reported on 09/04/2020), Disp: 250 mL, Rfl: 1   lidocaine-prilocaine (EMLA) cream, Apply 1 application topically as needed. (Patient taking differently: Apply 1 application topically as needed (port access).), Disp: 30 g, Rfl: 2   LORazepam (ATIVAN) 0.5 MG tablet, Please take 1 tablet 30 minutes before your scan as needed for anxiety (Patient not taking: Reported on 09/04/2020), Disp: 2 tablet, Rfl: 0   Multiple Vitamin (MULTIVITAMIN WITH MINERALS) TABS tablet, Take 1 tablet by mouth daily., Disp: , Rfl:    OXYGEN, Inhale 3 L/min into the lungs continuous., Disp: , Rfl:    potassium chloride 20 MEQ/15ML (10%) SOLN, Take 15 mLs (20 mEq total) by mouth 2 (two) times daily. (Patient not taking: Reported on 09/04/2020), Disp: 210 mL, Rfl: 0   triamcinolone ointment (KENALOG) 0.1 %, Apply 1 application topically in the morning and at bedtime., Disp: , Rfl:    Chlorphen-PE-Acetaminophen (NOREL AD PO), Take 1 tablet by mouth at bedtime., Disp: , Rfl:    fluticasone (FLONASE) 50 MCG/ACT nasal spray, Place 1 spray into both nostrils at bedtime., Disp: , Rfl:    prochlorperazine (COMPAZINE) 10 MG tablet, Take 1 tablet (10 mg total) by mouth every 6 (six) hours as needed. (Patient taking differently: Take 10 mg by mouth every 6 (six) hours as needed (nausea with chemo).), Disp: 30 tablet, Rfl: 2   Allergies  Allergen Reactions   Pseudoephedrine Hypertension   Gabapentin Other (See Comments)    Raise blood pressure and red rings around eyes Blood vessels popped in her eyes   Mobic [Meloxicam] Swelling    Inflamed the area that has inflammation and stabbing pains in the  area   Penicillins Hives     Review of Systems  Constitutional:  Negative for fever.  HENT:  Positive for postnasal drip. Negative for congestion and tinnitus.   Respiratory: Negative.  Negative for wheezing.   Cardiovascular: Negative.   Gastrointestinal:  Positive for abdominal pain. Negative for nausea and vomiting.  Genitourinary:  Positive for frequency.  Neurological:  Negative for dizziness and headaches.  Psychiatric/Behavioral: Negative.      Today's Vitals   08/26/20 1438  BP: 134/76  Pulse: 87  Temp: 98 F (36.7 C)  TempSrc: Oral  Weight: 146 lb 12.8 oz (66.6 kg)  Height: 5'  4.4" (1.636 m)  PainSc: 3   PainLoc: Neck   Body mass index is 24.89 kg/m.   Objective:  Physical Exam Constitutional:      General: She is not in acute distress.    Appearance: Normal appearance.  Cardiovascular:     Pulses: Normal pulses.     Heart sounds: No murmur heard.   No friction rub.  Pulmonary:     Effort: Pulmonary effort is normal. No respiratory distress.     Breath sounds: Normal breath sounds.  Neurological:     General: No focal deficit present.     Mental Status: She is alert and oriented to person, place, and time.     Cranial Nerves: No cranial nerve deficit.  Psychiatric:        Mood and Affect: Mood normal.        Behavior: Behavior normal.        Thought Content: Thought content normal.        Judgment: Judgment normal.        Assessment And Plan:     1. Right upper quadrant abdominal pain Urinalysis is normal, will send urine culture due to symptoms - POCT Urinalysis Dipstick (40973) - Urine Culture  2. Hypokalemia Will recheck her potassium level  3. Elevated lymphocytes Will check CXR due to elevated lymphocytes - DG Chest 2 View; Future  4. Dizziness Will check thyroid levels.  She is encouraged to stay well hydrated with water - CMP14+EGFR - TSH  5. Urinary frequency Will treat for urinary tract infection due to the symptoms pending  results will change medication - Urine Culture - nitrofurantoin, macrocrystal-monohydrate, (MACROBID) 100 MG capsule; Take 1 capsule (100 mg total) by mouth 2 (two) times daily for 5 days.  Dispense: 10 capsule; Refill: 0  6. Encounter for screening mammogram for malignant neoplasm of breast Pt instructed on Self Breast Exam.According to ACOG guidelines Women aged 31 and older are recommended to get an annual mammogram. Form completed and given to patient contact the [facility] for appointment scheduing.  Pt encouraged to get annual mammogram - MM Digital Screening; Future  7. Encounter for HIV (human immunodeficiency virus) test - HIV Antibody (routine testing w rflx)   8. Non-small cell lung cancer, right ([place]) Continue follow up with Oncology Continue wearing your oxygen as directed  9. Brain metastases [facility]) Continue follow up with Oncology    Patient was given opportunity to ask questions. Patient verbalized understanding of the plan and was able to repeat key elements of the plan. All questions were answered to their satisfaction.  Minette Brine, FNP    I, Minette Brine, FNP, have reviewed all documentation for this visit. The documentation on 08/26/20 for the exam, diagnosis, procedures, and orders are all accurate and complete.  IF YOU HAVE BEEN REFERRED TO A SPECIALIST, IT MAY TAKE 1-2 WEEKS TO SCHEDULE/PROCESS THE REFERRAL. IF YOU HAVE NOT HEARD FROM US/SPECIALIST IN TWO WEEKS, PLEASE GIVE [place] A CALL AT [phone].   THE PATIENT IS ENCOURAGED TO PRACTICE SOCIAL DISTANCING DUE TO THE COVID-19 PANDEMIC.

## 2020-08-27 ENCOUNTER — Encounter: Payer: Self-pay | Admitting: Internal Medicine

## 2020-08-27 ENCOUNTER — Encounter: Payer: Self-pay | Admitting: Nurse Practitioner

## 2020-08-27 DIAGNOSIS — J449 Chronic obstructive pulmonary disease, unspecified: Secondary | ICD-10-CM | POA: Diagnosis not present

## 2020-08-27 LAB — CMP14+EGFR
ALT: 11 IU/L (ref 0–32)
AST: 29 IU/L (ref 0–40)
Albumin/Globulin Ratio: 1.5 (ref 1.2–2.2)
Albumin: 4.1 g/dL (ref 3.8–4.8)
Alkaline Phosphatase: 59 IU/L (ref 44–121)
BUN/Creatinine Ratio: 10 — ABNORMAL LOW (ref 12–28)
BUN: 12 mg/dL (ref 8–27)
Bilirubin Total: <0.2 mg/dL (ref 0.0–1.2)
CO2: 24 mmol/L (ref 20–29)
Calcium: 9.3 mg/dL (ref 8.7–10.3)
Chloride: 102 mmol/L (ref 96–106)
Creatinine, Ser: 1.18 mg/dL — ABNORMAL HIGH (ref 0.57–1.00)
Globulin, Total: 2.7 g/dL (ref 1.5–4.5)
Glucose: 74 mg/dL (ref 65–99)
Potassium: 4.2 mmol/L (ref 3.5–5.2)
Sodium: 141 mmol/L (ref 134–144)
Total Protein: 6.8 g/dL (ref 6.0–8.5)
eGFR: 53 mL/min/{1.73_m2} — ABNORMAL LOW (ref >59)

## 2020-08-27 LAB — TSH: TSH: 2.24 u[IU]/mL (ref 0.450–4.500)

## 2020-08-27 LAB — HIV ANTIBODY (ROUTINE TESTING W REFLEX): HIV Screen 4th Generation wRfx: NONREACTIVE

## 2020-08-28 ENCOUNTER — Encounter: Payer: Self-pay | Admitting: Physician Assistant

## 2020-08-28 ENCOUNTER — Encounter: Payer: Self-pay | Admitting: Internal Medicine

## 2020-08-28 ENCOUNTER — Ambulatory Visit
Admission: RE | Admit: 2020-08-28 | Discharge: 2020-08-28 | Disposition: A | Discharge status: Home / Self Care | Payer: BC Managed Care – PPO | Source: Ambulatory Visit | Attending: Nurse Practitioner | Admitting: Nurse Practitioner

## 2020-08-28 DIAGNOSIS — J449 Chronic obstructive pulmonary disease, unspecified: Secondary | ICD-10-CM | POA: Diagnosis not present

## 2020-08-28 DIAGNOSIS — D72820 Lymphocytosis (symptomatic): Secondary | ICD-10-CM

## 2020-08-28 DIAGNOSIS — R059 Cough, unspecified: Secondary | ICD-10-CM | POA: Diagnosis not present

## 2020-08-28 DIAGNOSIS — C3491 Malignant neoplasm of unspecified part of right bronchus or lung: Secondary | ICD-10-CM | POA: Diagnosis not present

## 2020-08-28 LAB — URINE CULTURE

## 2020-08-29 ENCOUNTER — Encounter: Payer: Self-pay | Admitting: Hematology and Oncology

## 2020-08-29 ENCOUNTER — Other Ambulatory Visit: Payer: Self-pay

## 2020-08-29 ENCOUNTER — Inpatient Hospital Stay: Payer: BC Managed Care – PPO

## 2020-08-29 ENCOUNTER — Inpatient Hospital Stay (HOSPITAL_BASED_OUTPATIENT_CLINIC_OR_DEPARTMENT_OTHER): Payer: BC Managed Care – PPO | Admitting: Hematology and Oncology

## 2020-08-29 VITALS — BP 161/91 | HR 89 | Temp 97.5°F | Resp 17 | Ht 64.4 in | Wt 144.7 lb

## 2020-08-29 DIAGNOSIS — Z95828 Presence of other vascular implants and grafts: Secondary | ICD-10-CM | POA: Diagnosis not present

## 2020-08-29 DIAGNOSIS — Z79899 Other long term (current) drug therapy: Secondary | ICD-10-CM | POA: Diagnosis not present

## 2020-08-29 DIAGNOSIS — C3491 Malignant neoplasm of unspecified part of right bronchus or lung: Secondary | ICD-10-CM

## 2020-08-29 DIAGNOSIS — C7931 Secondary malignant neoplasm of brain: Secondary | ICD-10-CM

## 2020-08-29 DIAGNOSIS — Z9981 Dependence on supplemental oxygen: Secondary | ICD-10-CM | POA: Diagnosis not present

## 2020-08-29 DIAGNOSIS — Z5111 Encounter for antineoplastic chemotherapy: Secondary | ICD-10-CM

## 2020-08-29 DIAGNOSIS — Z5112 Encounter for antineoplastic immunotherapy: Secondary | ICD-10-CM | POA: Diagnosis not present

## 2020-08-29 DIAGNOSIS — Z7951 Long term (current) use of inhaled steroids: Secondary | ICD-10-CM | POA: Diagnosis not present

## 2020-08-29 DIAGNOSIS — Z87891 Personal history of nicotine dependence: Secondary | ICD-10-CM | POA: Diagnosis not present

## 2020-08-29 LAB — CBC WITH DIFFERENTIAL (CANCER CENTER ONLY)
Abs Immature Granulocytes: 0.02 10*3/uL (ref 0.00–0.07)
Basophils Absolute: 0 10*3/uL (ref 0.0–0.1)
Basophils Relative: 1 %
Eosinophils Absolute: 0.1 10*3/uL (ref 0.0–0.5)
Eosinophils Relative: 2 %
HCT: 30.2 % — ABNORMAL LOW (ref 36.0–46.0)
Hemoglobin: 9.9 g/dL — ABNORMAL LOW (ref 12.0–15.0)
Immature Granulocytes: 0 %
Lymphocytes Relative: 15 %
Lymphs Abs: 0.7 10*3/uL (ref 0.7–4.0)
MCH: 30.7 pg (ref 26.0–34.0)
MCHC: 32.8 g/dL (ref 30.0–36.0)
MCV: 93.8 fL (ref 80.0–100.0)
Monocytes Absolute: 0.5 10*3/uL (ref 0.1–1.0)
Monocytes Relative: 10 %
Neutro Abs: 3.3 10*3/uL (ref 1.7–7.7)
Neutrophils Relative %: 72 %
Platelet Count: 179 10*3/uL (ref 150–400)
RBC: 3.22 MIL/uL — ABNORMAL LOW (ref 3.87–5.11)
RDW: 14.6 % (ref 11.5–15.5)
WBC Count: 4.6 10*3/uL (ref 4.0–10.5)
nRBC: 0 % (ref 0.0–0.2)

## 2020-08-29 LAB — CMP (CANCER CENTER ONLY)
ALT: 10 U/L (ref 0–44)
AST: 25 U/L (ref 15–41)
Albumin: 3.6 g/dL (ref 3.5–5.0)
Alkaline Phosphatase: 51 U/L (ref 38–126)
Anion gap: 11 (ref 5–15)
BUN: 14 mg/dL (ref 8–23)
CO2: 30 mmol/L (ref 22–32)
Calcium: 9.5 mg/dL (ref 8.9–10.3)
Chloride: 104 mmol/L (ref 98–111)
Creatinine: 1.16 mg/dL — ABNORMAL HIGH (ref 0.44–1.00)
GFR, Estimated: 54 mL/min — ABNORMAL LOW (ref >60)
Glucose, Bld: 100 mg/dL — ABNORMAL HIGH (ref 70–99)
Potassium: 3.8 mmol/L (ref 3.5–5.1)
Sodium: 145 mmol/L (ref 135–145)
Total Bilirubin: 0.3 mg/dL (ref 0.3–1.2)
Total Protein: 7 g/dL (ref 6.5–8.1)

## 2020-08-29 MED ORDER — PROCHLORPERAZINE MALEATE 10 MG PO TABS
10.0000 mg | ORAL_TABLET | Freq: Once | ORAL | Status: AC
Start: 2020-08-29 — End: 2020-08-29
  Administered 2020-08-29: 10 mg via ORAL

## 2020-08-29 MED ORDER — CYANOCOBALAMIN 1000 MCG/ML IJ SOLN
1000.0000 ug | Freq: Once | INTRAMUSCULAR | Status: AC
  Administered 2020-08-29: 1000 ug via INTRAMUSCULAR

## 2020-08-29 MED ORDER — SODIUM CHLORIDE 0.9% FLUSH
10.0000 mL | Freq: Once | INTRAVENOUS | Status: AC
  Administered 2020-08-29: 10 mL
  Filled 2020-08-29: qty 10

## 2020-08-29 MED ORDER — SODIUM CHLORIDE 0.9 % IV SOLN
500.0000 mg/m2 | Freq: Once | INTRAVENOUS | Status: AC
  Administered 2020-08-29: 900 mg via INTRAVENOUS
  Filled 2020-08-29: qty 20

## 2020-08-29 MED ORDER — SODIUM CHLORIDE 0.9 % IV SOLN
200.0000 mg | Freq: Once | INTRAVENOUS | Status: AC
  Administered 2020-08-29: 200 mg via INTRAVENOUS
  Filled 2020-08-29: qty 8

## 2020-08-29 MED ORDER — SODIUM CHLORIDE 0.9 % IV SOLN
Freq: Once | INTRAVENOUS | Status: AC
  Filled 2020-08-29: qty 250

## 2020-08-29 MED ORDER — SODIUM CHLORIDE 0.9% FLUSH
10.0000 mL | INTRAVENOUS | Status: DC | PRN
  Filled 2020-08-29: qty 10

## 2020-08-29 MED ORDER — CYANOCOBALAMIN 1000 MCG/ML IJ SOLN
INTRAMUSCULAR | Status: AC
  Filled 2020-08-29: qty 1

## 2020-08-29 MED ORDER — PROCHLORPERAZINE MALEATE 10 MG PO TABS
ORAL_TABLET | ORAL | Status: AC
  Filled 2020-08-29: qty 1

## 2020-08-29 MED ORDER — HEPARIN SOD (PORK) LOCK FLUSH 100 UNIT/ML IV SOLN
500.0000 [IU] | Freq: Once | INTRAVENOUS | Status: DC | PRN
  Filled 2020-08-29: qty 5

## 2020-08-29 NOTE — Progress Notes (Signed)
[facility] [phone]   [phone]  PROGRESS NOTE  Patient Care Team: Minette Brine, FNP as PCP - General (General Practice)  Hematological/Oncological History # Metastatic Adenocarcinoma of the Lung #Brain Metastasis 1) 11/08/2019: establish care with Dr. Julien Nordmann and his PA Cassie Heilingoetter. Noted to have widely metastatic lung cancer with involvement of the brain, lymph nodes, and right lung.  2) 12/04/2019: palliative radiation to the right lung mass/cervical adenopathy 3) 9/7/02021: started therapy with Carbo/Pem/Pem 4) 9/29-10/09/2019: patient underwent SRS to the brain mets 5) 03/22/2020: repeat CT C/A/P and neck scheduled. Transfer care to Dr. Lorenso Courier.   6) 03/22/2020: CT C/A/P showed interval significant improvement in previously demonstrated extensive confluent lymphadenopathy in the superior mediastinum and right hilar regions. 7) 08/15/2020: Holding chemotherapy due to poor Hgb and fatigue. Allowing patient a brief 'chemo holiday' to rebound appropriately from last cycle.  8) 08/29/2020: restart chemotherapy, Cycle 12 Day 1  Interval History:  Elizabeth Mayer 60 y.o. female with medical history significant for metastatic adenocarcinoma of the lung who presents for follow up. The patient's last visit was on 08/15/2020. In the interim she took a brief chemotherapy holiday.  On exam today Elizabeth Mayer notes she does not feel much better than she did prior to her last visit.  She has had an increase in phlegm production and her primary care provider has provided her with antibiotics.  She notes that she has not had any fevers, chills, sweats with this upper respiratory infection.  She has lost about 5 pounds with a weight as she "does not feel like eating".  She does that she does have some issues with dry eyes and occasional skin rash otherwise she has been well.  Despite not feeling as well her labs have improved and she is willing and able to proceed  with treatment at this time.  She otherwise denies any nausea, vomiting, or diarrhea.  She is on 3 L of oxygen and notes that this level has been stable.  Otherwise she has not noticed any side effects as a result of her chemotherapy treatment.  A full 10 point ROS is listed below.  MEDICAL HISTORY:  Past Medical History:  Diagnosis Date  . Anemia   . Angina 1982   related to stress  . Asthma    in the past,  no current problems  . Complication of anesthesia    states anesthesia made her hair fall out  . Constipation   . Dyspnea    on oxygen at home - 2L via [place]  . Fatigue   . GERD (gastroesophageal reflux disease)    patient denies this dx  . Headache   . Heart murmur 1970s   no problems currently  . Ingrown toenail   . Lung cancer ([place]) 10/2019   metastatic disease to the brain  . On home oxygen therapy    2.2 lpm, 24 hours a day  . Past heart attack 1980-1981   pt states she passed out and woke up in hospital- told she had heart attack, but then dr said he couldn't find anything wrong.  . Pneumonia    x 1    SURGICAL HISTORY: Past Surgical History:  Procedure Laterality Date  . CERVICAL DISC SURGERY  2000   Disc removed from neck   . ingrown toe nail surgery Bilateral   . IR IMAGING GUIDED PORT INSERTION  02/16/2020  . MULTIPLE TOOTH EXTRACTIONS     for braces  . RADIOLOGY  WITH ANESTHESIA N/A 12/05/2019   Procedure: MRI BRAIN WITH AND WITHOUT CONTRAST;  Surgeon: Radiologist, Medication, MD;  Location: [facility];  Service: Radiology;  Laterality: N/A;  . RADIOLOGY WITH ANESTHESIA N/A 01/02/2020   Procedure: MRI BRAIN WITH AND WITHOUT CONTRAST;  Surgeon: Radiologist, Medication, MD;  Location: [facility];  Service: Radiology;  Laterality: N/A;  . RADIOLOGY WITH ANESTHESIA N/A 02/20/2020   Procedure: MRI WITH ANESTHESIA OF BRAIN WITH AND WITHOUT CONTRAST;  Surgeon: Radiologist, Medication, MD;  Location: [facility];  Service: Radiology;  Laterality: N/A;  . RADIOLOGY WITH ANESTHESIA N/A  06/11/2020   Procedure: MRI WITH ANESTHESIA OF BRAIN WITH AND WITHOUT CONTRAST;  Surgeon: Radiologist, Medication, MD;  Location: [facility];  Service: Radiology;  Laterality: N/A;  . TONSILLECTOMY      SOCIAL HISTORY: Social History   Socioeconomic History  . Marital status: Married    Spouse name: Not on file  . Number of children: 0  . Years of education: Not on file  . Highest education level: Not on file  Occupational History  . Occupation: Secondary school teacher  Tobacco Use  . Smoking status: Former Smoker    Packs/day: 1.00    Years: 20.00    Pack years: 20.00    Types: Cigarettes    Start date: 54    Quit date: 2001    Years since quitting: 21.4  . Smokeless tobacco: Never Used  Vaping Use  . Vaping Use: Never used  Substance and Sexual Activity  . Alcohol use: No  . Drug use: No  . Sexual activity: Yes    Birth control/protection: None, Post-menopausal  Other Topics Concern  . Not on file  Social History Narrative  . Not on file   Social Determinants of Health   Financial Resource Strain: High Risk  . Difficulty of Paying Living Expenses: Hard  Food Insecurity: No Food Insecurity  . Worried About Charity fundraiser in the Last Year: Never true  . Ran Out of Food in the Last Year: Never true  Transportation Needs: No Transportation Needs  . Lack of Transportation (Medical): No  . Lack of Transportation (Non-Medical): No  Physical Activity: Inactive  . Days of Exercise per Week: 0 days  . Minutes of Exercise per Session: 0 min  Stress: Not on file  Social Connections: Moderately Isolated  . Frequency of Communication with Friends and Family: Twice a week  . Frequency of Social Gatherings with Friends and Family: Twice a week  . Attends Religious Services: More than 4 times per year  . Active Member of Clubs or Organizations: No  . Attends Archivist Meetings: Not on file  . Marital Status: Separated  Intimate Partner Violence: Not on file     FAMILY HISTORY: Family History  Problem Relation Age of Onset  . Arthritis Mother   . Heart failure Father     ALLERGIES:  is allergic to pseudoephedrine, gabapentin, mobic [meloxicam], and penicillins.  MEDICATIONS:  Current Outpatient Medications  Medication Sig Dispense Refill  . acetaminophen (TYLENOL) 500 MG tablet Take 1,000 mg by mouth every 6 (six) hours as needed for moderate pain.     Marland Kitchen albuterol (VENTOLIN HFA) 108 (90 Base) MCG/ACT inhaler Inhale 2 puffs into the lungs every 6 (six) hours as needed for wheezing or shortness of breath. 18 g 6  . budesonide-formoterol (SYMBICORT) 80-4.5 MCG/ACT inhaler Inhale 2 puffs into the lungs in the morning and at bedtime. 1 each 11  . camphor-menthol (SARNA) lotion  Apply 1 application topically as needed for itching.    . Carboxymethylcellulose Sodium (REFRESH CELLUVISC OP) Apply to eye.    . fexofenadine (ALLEGRA) 180 MG tablet Take 180 mg by mouth daily.    . folic acid (FOLVITE) 1 MG tablet Take 1 tablet (1 mg total) by mouth daily. 90 tablet 2  . guaiFENesin (MUCINEX) 600 MG 12 hr tablet Take 600 mg by mouth 2 (two) times daily.     Marland Kitchen HYDROcodone-acetaminophen (HYCET) 7.5-325 mg/15 ml solution Take 10 mLs by mouth 2 (two) times daily. For pain from cancer and radiation reactions (Patient taking differently: Take 10 mLs by mouth 2 (two) times daily as needed for moderate pain or severe pain. For pain from cancer and radiation reactions) 473 mL 0  . lidocaine (XYLOCAINE) 2 % solution Use as directed 15 mLs in the mouth or throat as needed for mouth pain. Swallow 30 min prior to meals and at bedtime for throat/esophagus pain 250 mL 1  . lidocaine-prilocaine (EMLA) cream Apply 1 application topically as needed. (Patient taking differently: Apply 1 application topically as needed (port access).) 30 g 2  . LORazepam (ATIVAN) 0.5 MG tablet Please take 1 tablet 30 minutes before your scan as needed for anxiety 2 tablet 0  . Multiple Vitamin  (MULTIVITAMIN WITH MINERALS) TABS tablet Take 1 tablet by mouth daily.    . nitrofurantoin, macrocrystal-monohydrate, (MACROBID) 100 MG capsule Take 1 capsule (100 mg total) by mouth 2 (two) times daily for 5 days. 10 capsule 0  . OXYGEN Inhale 3 L/min into the lungs continuous.    . potassium chloride 20 MEQ/15ML (10%) SOLN Take 15 mLs (20 mEq total) by mouth 2 (two) times daily. 210 mL 0  . prochlorperazine (COMPAZINE) 10 MG tablet Take 1 tablet (10 mg total) by mouth every 6 (six) hours as needed. (Patient not taking: No sig reported) 30 tablet 2  . triamcinolone ointment (KENALOG) 0.1 % Apply 1 application topically in the morning and at bedtime.     No current facility-administered medications for this visit.   Facility-Administered Medications Ordered in Other Visits  Medication Dose Route Frequency Provider Last Rate Last Admin  . heparin lock flush 100 unit/mL  500 Units Intracatheter Once PRN Ledell Peoples IV, MD      . sodium chloride flush (NS) 0.9 % injection 10 mL  10 mL Intracatheter PRN Orson Slick, MD        REVIEW OF SYSTEMS:   Constitutional: ( - ) fevers, ( - )  chills , ( - ) night sweats Eyes: ( - ) blurriness of vision, ( - ) double vision, ( - ) watery eyes Ears, nose, mouth, throat, and face: ( - ) mucositis, ( - ) sore throat Respiratory: ( - ) cough, ( - ) dyspnea, ( - ) wheezes Cardiovascular: ( - ) palpitation, ( - ) chest discomfort, ( - ) lower extremity swelling Gastrointestinal:  ( - ) nausea, ( - ) heartburn, ( - ) change in bowel habits Skin: ( - ) abnormal skin rashes Lymphatics: ( - ) new lymphadenopathy, ( - ) easy bruising Neurological: ( - ) numbness, ( - ) tingling, ( - ) new weaknesses Behavioral/Psych: ( - ) mood change, ( - ) new changes  All other systems were reviewed with the patient and are negative.  PHYSICAL EXAMINATION: ECOG PERFORMANCE STATUS: 1 - Symptomatic but completely ambulatory  Vitals:   08/29/20 1143  BP: (!) 161/91  Pulse: 89  Resp: 17  Temp: (!) 97.5 F (36.4 C)  SpO2: 100%   Filed Weights   08/29/20 1143  Weight: 144 lb 11.2 oz (65.6 kg)    GENERAL: well appearing middle aged [place] American female. alert, no distress and comfortable SKIN: skin color, texture, turgor are normal, no rashes or significant lesions EYES: conjunctiva are pink and non-injected, sclera clear LUNGS: clear to auscultation and percussion with normal breathing effort HEART: regular rate & rhythm and no murmurs and no lower extremity edema Musculoskeletal: no cyanosis of digits and no clubbing  PSYCH: alert & oriented x 3, fluent speech NEURO: no focal motor/sensory deficits  LABORATORY DATA:  I have reviewed the data as listed CBC Latest Ref Rng & Units 08/29/2020 08/15/2020 07/25/2020  WBC 4.0 - 10.5 K/uL 4.6 3.4(L) 2.8(L)  Hemoglobin 12.0 - 15.0 g/dL 9.9(L) 8.7(L) 9.1(L)  Hematocrit 36.0 - 46.0 % 30.2(L) 26.9(L) 28.6(L)  Platelets 150 - 400 K/uL 179 179 148(L)    CMP Latest Ref Rng & Units 08/29/2020 08/26/2020 08/15/2020  Glucose 70 - 99 mg/dL 100(H) 74 79  BUN 8 - 23 mg/dL 14 12 7(L)  Creatinine 0.44 - 1.00 mg/dL 1.16(H) 1.18(H) 1.16(H)  Sodium 135 - 145 mmol/L 145 141 143  Potassium 3.5 - 5.1 mmol/L 3.8 4.2 2.9(L)  Chloride 98 - 111 mmol/L 104 102 103  CO2 22 - 32 mmol/L 30 24 31   Calcium 8.9 - 10.3 mg/dL 9.5 9.3 8.9  Total Protein 6.5 - 8.1 g/dL 7.0 6.8 6.5  Total Bilirubin 0.3 - 1.2 mg/dL 0.3 <0.2 <0.2(L)  Alkaline Phos 38 - 126 U/L 51 59 48  AST 15 - 41 U/L 25 29 27   ALT 0 - 44 U/L 10 11 14     RADIOGRAPHIC STUDIES: No results found.  ASSESSMENT & PLAN Elizabeth Mayer 60 y.o. female with medical history significant for metastatic adenocarcinoma of the lung who presents for follow up.    After review the labs, the records, discussion with the patient the findings are most consistent with metastatic adenocarcinoma the lung without any targetable mutations.  The patient has been started on carboplatin  pemetrexed pembrolizumab every 3 weeks with Dr. Julien Nordmann, however the patient has expressed a desire to transition to another provider.  As such we were happy to take up her care here.  She already discontinued the carboplatin and is on maintenance pemetrexed/pembrolizumab therapy. We assumed her care with Cycle 6 on 04/04/2020. Today was to be Cycle 12 Day 1, however we are holding due to fatigue, anemia, and failure of her labs to rebound from prior cycle.     After discussion with the patient and review of her blood work she appears appropriate for continued treatment at this time.  # Metastatic Adenocarcinoma of the Lung #Brain Metastasis --repeat scans showed the patient is having an appropriate response to treatment with Carbo/Pem/Pem chemotherapy. Next scan due early July 2022.   --continue with maintenance Pem/Pem, proceed with Cycle 12 Day 1 today --patient had a brief 2 week chemotherapy holiday due to fatigue and poor labs prior to Cycle 12.  --no targetable mutations noted on prior workup. --continue to follow with Dr. Laqueta Due Onc for metastatic spread to the brain.  --can consider docetaxel/ramucirumab at time of progression.  --RTC in 3 weeks for clinic visit and continued chemo treatments  No orders of the defined types were placed in this encounter.   All questions were answered. The patient knows to call the clinic with  any problems, questions or concerns.  A total of more than 30 minutes were spent on this encounter and over half of that time was spent on counseling and coordination of care as outlined above.   Ledell Peoples, MD Department of Hematology/Oncology [facility] Phone: [phone] Pager: [phone] Email: [email or web address]  08/29/2020 5:46 PM

## 2020-09-03 ENCOUNTER — Telehealth: Payer: Self-pay | Admitting: *Deleted

## 2020-09-03 NOTE — Telephone Encounter (Signed)
Please have schedule for an urgent visit in [facility] or my clinic in the next 1-2 days.

## 2020-09-03 NOTE — Telephone Encounter (Signed)
Patient called to report that since she got her last chemo treatment on 08/29/2020 and since then she has been bedridden over the weekend.  She reports having a pain in her kidney area.  She reports hydrating atleast 64 ozs of water daily.  She reports history of urinary tract infections.  She completed the antibiotic for UTI on Saturday and since that was finished she started having the back pain around her kidney area.  She reports taking tylenol for the patient.  She denies having any pain with urinating.  Denies any noticeable blood in her urine.  Denies having a foul odor.  She also reports seeing some floaters.  Reports a lot of fatigue post treatment.    Routed to Dr Lorenso Courier for advisement  On if she needs to be evaluated here in the office or if further dosing of medication needs to be done for uti.

## 2020-09-04 ENCOUNTER — Inpatient Hospital Stay: Payer: BC Managed Care – PPO | Attending: Internal Medicine | Admitting: Physician Assistant

## 2020-09-04 ENCOUNTER — Inpatient Hospital Stay: Payer: BC Managed Care – PPO

## 2020-09-04 ENCOUNTER — Other Ambulatory Visit: Payer: Self-pay

## 2020-09-04 VITALS — BP 136/68 | HR 95 | Temp 97.2°F | Resp 19 | Ht 64.4 in | Wt 143.5 lb

## 2020-09-04 DIAGNOSIS — C7931 Secondary malignant neoplasm of brain: Secondary | ICD-10-CM | POA: Insufficient documentation

## 2020-09-04 DIAGNOSIS — Z79899 Other long term (current) drug therapy: Secondary | ICD-10-CM | POA: Diagnosis not present

## 2020-09-04 DIAGNOSIS — C3491 Malignant neoplasm of unspecified part of right bronchus or lung: Secondary | ICD-10-CM | POA: Diagnosis not present

## 2020-09-04 DIAGNOSIS — Z8249 Family history of ischemic heart disease and other diseases of the circulatory system: Secondary | ICD-10-CM | POA: Insufficient documentation

## 2020-09-04 DIAGNOSIS — Z87891 Personal history of nicotine dependence: Secondary | ICD-10-CM | POA: Diagnosis not present

## 2020-09-04 DIAGNOSIS — Z8261 Family history of arthritis: Secondary | ICD-10-CM | POA: Diagnosis not present

## 2020-09-04 DIAGNOSIS — M549 Dorsalgia, unspecified: Secondary | ICD-10-CM

## 2020-09-04 DIAGNOSIS — R42 Dizziness and giddiness: Secondary | ICD-10-CM | POA: Insufficient documentation

## 2020-09-04 DIAGNOSIS — R5383 Other fatigue: Secondary | ICD-10-CM | POA: Insufficient documentation

## 2020-09-04 DIAGNOSIS — M546 Pain in thoracic spine: Secondary | ICD-10-CM | POA: Insufficient documentation

## 2020-09-04 DIAGNOSIS — R590 Localized enlarged lymph nodes: Secondary | ICD-10-CM | POA: Diagnosis not present

## 2020-09-04 DIAGNOSIS — R531 Weakness: Secondary | ICD-10-CM | POA: Insufficient documentation

## 2020-09-04 LAB — URINALYSIS, COMPLETE (UACMP) WITH MICROSCOPIC
Bilirubin Urine: NEGATIVE
Glucose, UA: NEGATIVE mg/dL
Hgb urine dipstick: NEGATIVE
Ketones, ur: NEGATIVE mg/dL
Leukocytes,Ua: NEGATIVE
Nitrite: NEGATIVE
Protein, ur: NEGATIVE mg/dL
Specific Gravity, Urine: 1.005 (ref 1.005–1.030)
pH: 6 (ref 5.0–8.0)

## 2020-09-04 LAB — CBC WITH DIFFERENTIAL (CANCER CENTER ONLY)
Abs Immature Granulocytes: 0.01 10*3/uL (ref 0.00–0.07)
Basophils Absolute: 0.1 10*3/uL (ref 0.0–0.1)
Basophils Relative: 2 %
Eosinophils Absolute: 0.2 10*3/uL (ref 0.0–0.5)
Eosinophils Relative: 5 %
HCT: 31.5 % — ABNORMAL LOW (ref 36.0–46.0)
Hemoglobin: 9.9 g/dL — ABNORMAL LOW (ref 12.0–15.0)
Immature Granulocytes: 0 %
Lymphocytes Relative: 16 %
Lymphs Abs: 0.5 10*3/uL — ABNORMAL LOW (ref 0.7–4.0)
MCH: 30.5 pg (ref 26.0–34.0)
MCHC: 31.4 g/dL (ref 30.0–36.0)
MCV: 96.9 fL (ref 80.0–100.0)
Monocytes Absolute: 0.1 10*3/uL (ref 0.1–1.0)
Monocytes Relative: 3 %
Neutro Abs: 2.5 10*3/uL (ref 1.7–7.7)
Neutrophils Relative %: 74 %
Platelet Count: 138 10*3/uL — ABNORMAL LOW (ref 150–400)
RBC: 3.25 MIL/uL — ABNORMAL LOW (ref 3.87–5.11)
RDW: 13.6 % (ref 11.5–15.5)
WBC Count: 3.4 10*3/uL — ABNORMAL LOW (ref 4.0–10.5)
nRBC: 0 % (ref 0.0–0.2)

## 2020-09-04 LAB — CMP (CANCER CENTER ONLY)
ALT: 19 U/L (ref 0–44)
AST: 47 U/L — ABNORMAL HIGH (ref 15–41)
Albumin: 3.5 g/dL (ref 3.5–5.0)
Alkaline Phosphatase: 45 U/L (ref 38–126)
Anion gap: 12 (ref 5–15)
BUN: 14 mg/dL (ref 8–23)
CO2: 28 mmol/L (ref 22–32)
Calcium: 9.3 mg/dL (ref 8.9–10.3)
Chloride: 101 mmol/L (ref 98–111)
Creatinine: 1.04 mg/dL — ABNORMAL HIGH (ref 0.44–1.00)
GFR, Estimated: >60 mL/min (ref >60)
Glucose, Bld: 72 mg/dL (ref 70–99)
Potassium: 3.7 mmol/L (ref 3.5–5.1)
Sodium: 141 mmol/L (ref 135–145)
Total Bilirubin: 0.3 mg/dL (ref 0.3–1.2)
Total Protein: 7.2 g/dL (ref 6.5–8.1)

## 2020-09-04 LAB — MAGNESIUM: Magnesium: 1.8 mg/dL (ref 1.7–2.4)

## 2020-09-04 LAB — TSH: TSH: 1.961 u[IU]/mL (ref 0.308–3.960)

## 2020-09-04 NOTE — Progress Notes (Signed)
[facility] [phone]   [phone]  PROGRESS NOTE  Patient Care Team: Minette Brine, FNP as PCP - General (General Practice)  Hematological/Oncological History # Metastatic Adenocarcinoma of the Lung #Brain Metastasis 1) 11/08/2019: establish care with Dr. Julien Nordmann and his PA Cassie Heilingoetter. Noted to have widely metastatic lung cancer with involvement of the brain, lymph nodes, and right lung.  2) 12/04/2019: palliative radiation to the right lung mass/cervical adenopathy 3) 9/7/02021: started therapy with Carbo/Pem/Pem 4) 9/29-10/09/2019: patient underwent SRS to the brain mets 5) 03/22/2020: repeat CT C/A/P and neck scheduled. Transfer care to Dr. Lorenso Courier.   6) 03/22/2020: CT C/A/P showed interval significant improvement in previously demonstrated extensive confluent lymphadenopathy in the superior mediastinum and right hilar regions. 7) 08/15/2020: Holding chemotherapy due to poor Hgb and fatigue. Allowing patient a brief 'chemo holiday' to rebound appropriately from last cycle.  8) 08/29/2020: restarted chemotherapy, Cycle 12 Day 1  Interval History:  Elizabeth Mayer 60 y.o. female with medical history significant for metastatic adenocarcinoma of the lung who presents for follow up. The patient's last visit was on 08/29/2020 when she received Cycle 12, Day 1 of carbo/pem/pem.   On exam today Elizabeth Mayer notes progressive fatigue since the last treatment. She has been in bed for greater than half the day. Her appetite is fair but she is trying to eat and stay hydrated. Patient denies any nausea, vomiting or abdominal pain. Her bowel movements are regular without any constipation or diarrhea. She denies any urinary symptoms including dysuria, hematuria or foul-smelling urine. She notes mid back pain that radiates to her flanks, bilaterally. She rates the pain as 9 out of 10 on a pain scale. She currently takes two Tylenol 650 mg tablets up to twice a day  with improvement of pain. At this time, she does not require using Hycet for pain relief. She notes episodes of dizziness and presyncope. She reports that the dizziness is trigger with movement. Patient denies any headaches, vision changes or other focal deficits.  She is on 3 L of oxygen and notes that this level has been stable.  She denies any fevers, chills, night sweats, chest pain or cough. She has no other complaints.  A full 10 point ROS is listed below.  MEDICAL HISTORY:  Past Medical History:  Diagnosis Date  . Anemia   . Angina 1982   related to stress  . Asthma    in the past,  no current problems  . Complication of anesthesia    states anesthesia made her hair fall out  . Constipation   . Dyspnea    on oxygen at home - 2L via [place]  . Fatigue   . GERD (gastroesophageal reflux disease)    patient denies this dx  . Headache   . Heart murmur 1970s   no problems currently  . Ingrown toenail   . Lung cancer ([place]) 10/2019   metastatic disease to the brain  . On home oxygen therapy    2.2 lpm, 24 hours a day  . Past heart attack 1980-1981   pt states she passed out and woke up in hospital- told she had heart attack, but then dr said he couldn't find anything wrong.  . Pneumonia    x 1    SURGICAL HISTORY: Past Surgical History:  Procedure Laterality Date  . CERVICAL DISC SURGERY  2000   Disc removed from neck   . ingrown toe nail surgery Bilateral   . IR  IMAGING GUIDED PORT INSERTION  02/16/2020  . MULTIPLE TOOTH EXTRACTIONS     for braces  . RADIOLOGY WITH ANESTHESIA N/A 12/05/2019   Procedure: MRI BRAIN WITH AND WITHOUT CONTRAST;  Surgeon: Radiologist, Medication, MD;  Location: [facility];  Service: Radiology;  Laterality: N/A;  . RADIOLOGY WITH ANESTHESIA N/A 01/02/2020   Procedure: MRI BRAIN WITH AND WITHOUT CONTRAST;  Surgeon: Radiologist, Medication, MD;  Location: [facility];  Service: Radiology;  Laterality: N/A;  . RADIOLOGY WITH ANESTHESIA N/A 02/20/2020   Procedure:  MRI WITH ANESTHESIA OF BRAIN WITH AND WITHOUT CONTRAST;  Surgeon: Radiologist, Medication, MD;  Location: [facility];  Service: Radiology;  Laterality: N/A;  . RADIOLOGY WITH ANESTHESIA N/A 06/11/2020   Procedure: MRI WITH ANESTHESIA OF BRAIN WITH AND WITHOUT CONTRAST;  Surgeon: Radiologist, Medication, MD;  Location: [facility];  Service: Radiology;  Laterality: N/A;  . TONSILLECTOMY      SOCIAL HISTORY: Social History   Socioeconomic History  . Marital status: Married    Spouse name: Not on file  . Number of children: 0  . Years of education: Not on file  . Highest education level: Not on file  Occupational History  . Occupation: Secondary school teacher  Tobacco Use  . Smoking status: Former Smoker    Packs/day: 1.00    Years: 20.00    Pack years: 20.00    Types: Cigarettes    Start date: 57    Quit date: 2001    Years since quitting: 21.4  . Smokeless tobacco: Never Used  Vaping Use  . Vaping Use: Never used  Substance and Sexual Activity  . Alcohol use: No  . Drug use: No  . Sexual activity: Yes    Birth control/protection: None, Post-menopausal  Other Topics Concern  . Not on file  Social History Narrative  . Not on file   Social Determinants of Health   Financial Resource Strain: High Risk  . Difficulty of Paying Living Expenses: Hard  Food Insecurity: No Food Insecurity  . Worried About Charity fundraiser in the Last Year: Never true  . Ran Out of Food in the Last Year: Never true  Transportation Needs: No Transportation Needs  . Lack of Transportation (Medical): No  . Lack of Transportation (Non-Medical): No  Physical Activity: Inactive  . Days of Exercise per Week: 0 days  . Minutes of Exercise per Session: 0 min  Stress: Not on file  Social Connections: Moderately Isolated  . Frequency of Communication with Friends and Family: Twice a week  . Frequency of Social Gatherings with Friends and Family: Twice a week  . Attends Religious Services: More than 4 times per year   . Active Member of Clubs or Organizations: No  . Attends Archivist Meetings: Not on file  . Marital Status: Separated  Intimate Partner Violence: Not on file    FAMILY HISTORY: Family History  Problem Relation Age of Onset  . Arthritis Mother   . Heart failure Father     ALLERGIES:  is allergic to pseudoephedrine, gabapentin, mobic [meloxicam], and penicillins.  MEDICATIONS:  Current Outpatient Medications  Medication Sig Dispense Refill  . acetaminophen (TYLENOL) 500 MG tablet Take 1,000 mg by mouth every 6 (six) hours as needed for moderate pain.     Marland Kitchen albuterol (VENTOLIN HFA) 108 (90 Base) MCG/ACT inhaler Inhale 2 puffs into the lungs every 6 (six) hours as needed for wheezing or shortness of breath. 18 g 6  . budesonide-formoterol (SYMBICORT) 80-4.5 MCG/ACT inhaler  Inhale 2 puffs into the lungs in the morning and at bedtime. 1 each 11  . camphor-menthol (SARNA) lotion Apply 1 application topically as needed for itching.    . Carboxymethylcellulose Sodium (REFRESH CELLUVISC OP) Place 1-2 drops into both eyes See admin instructions. Instill 1 drop into both eyes twice daily (scheduled) & then use as needed throughout the day for dry/irritated eyes    . Chlorphen-PE-Acetaminophen (NOREL AD PO) Take 1 tablet by mouth at bedtime.    . fexofenadine (ALLEGRA) 180 MG tablet Take 180 mg by mouth in the morning.    . folic acid (FOLVITE) 1 MG tablet Take 1 tablet (1 mg total) by mouth daily. (Patient taking differently: Take 1 mg by mouth in the morning.) 90 tablet 2  . guaiFENesin (MUCINEX) 600 MG 12 hr tablet Take 600 mg by mouth 2 (two) times daily.     Marland Kitchen HYDROcodone-acetaminophen (HYCET) 7.5-325 mg/15 ml solution Take 10 mLs by mouth 2 (two) times daily. For pain from cancer and radiation reactions (Patient taking differently: Take 10 mLs by mouth 2 (two) times daily as needed for moderate pain or severe pain. For pain from cancer and radiation reactions) 473 mL 0  .  lidocaine-prilocaine (EMLA) cream Apply 1 application topically as needed. (Patient taking differently: Apply 1 application topically as needed (port access).) 30 g 2  . LORazepam (ATIVAN) 0.5 MG tablet Please take 1 tablet 30 minutes before your scan as needed for anxiety (Patient not taking: Reported on 09/04/2020) 2 tablet 0  . Multiple Vitamin (MULTIVITAMIN WITH MINERALS) TABS tablet Take 1 tablet by mouth daily.    . OXYGEN Inhale 3 L/min into the lungs continuous.    . potassium chloride 20 MEQ/15ML (10%) SOLN Take 15 mLs (20 mEq total) by mouth 2 (two) times daily. (Patient not taking: Reported on 09/04/2020) 210 mL 0  . prochlorperazine (COMPAZINE) 10 MG tablet Take 1 tablet (10 mg total) by mouth every 6 (six) hours as needed. (Patient taking differently: Take 10 mg by mouth every 6 (six) hours as needed (nausea with chemo).) 30 tablet 2  . triamcinolone ointment (KENALOG) 0.1 % Apply 1 application topically in the morning and at bedtime.    . fluticasone (FLONASE) 50 MCG/ACT nasal spray Place 1 spray into both nostrils at bedtime.    . lidocaine (XYLOCAINE) 2 % solution Use as directed 15 mLs in the mouth or throat as needed for mouth pain. Swallow 30 min prior to meals and at bedtime for throat/esophagus pain (Patient not taking: Reported on 09/04/2020) 250 mL 1   No current facility-administered medications for this visit.    REVIEW OF SYSTEMS:   Constitutional: ( - ) fevers, ( - )  chills , ( - ) night sweats Eyes: ( - ) blurriness of vision, ( - ) double vision, ( - ) watery eyes Ears, nose, mouth, throat, and face: ( - ) mucositis, ( - ) sore throat Respiratory: ( - ) cough, ( - ) dyspnea, ( - ) wheezes Cardiovascular: ( - ) palpitation, ( - ) chest discomfort, ( - ) lower extremity swelling Gastrointestinal:  ( - ) nausea, ( - ) heartburn, ( - ) change in bowel habits Skin: ( - ) abnormal skin rashes Lymphatics: ( - ) new lymphadenopathy, ( - ) easy bruising Neurological: ( - )  numbness, ( - ) tingling, ( - ) new weaknesses Behavioral/Psych: ( - ) mood change, ( - ) new changes  All other systems were  reviewed with the patient and are negative.  PHYSICAL EXAMINATION: ECOG PERFORMANCE STATUS: 3 - Symptomatic, >50% confined to bed  Vitals:   09/04/20 1041  BP: 136/68  Pulse: 95  Resp: 19  Temp: (!) 97.2 F (36.2 C)  SpO2: 97%   Filed Weights   09/04/20 1041  Weight: 143 lb 8 oz (65.1 kg)    GENERAL: well appearing middle aged [place] American female. alert, no distress and comfortable SKIN: skin color, texture, turgor are normal, no rashes or significant lesions EYES: conjunctiva are pink and non-injected, sclera clear LUNGS: clear to auscultation and percussion with normal breathing effort HEART: regular rate & rhythm and no murmurs and no lower extremity edema Musculoskeletal: no cyanosis of digits and no clubbing. Tenderness to palpation in thoracic vertebrae and right CVA tenderness.  PSYCH: alert & oriented x 3, fluent speech NEURO: no focal motor/sensory deficits   LABORATORY DATA:  I have reviewed the data as listed CBC Latest Ref Rng & Units 09/04/2020 08/29/2020 08/15/2020  WBC 4.0 - 10.5 K/uL 3.4(L) 4.6 3.4(L)  Hemoglobin 12.0 - 15.0 g/dL 9.9(L) 9.9(L) 8.7(L)  Hematocrit 36.0 - 46.0 % 31.5(L) 30.2(L) 26.9(L)  Platelets 150 - 400 K/uL 138(L) 179 179    CMP Latest Ref Rng & Units 09/04/2020 08/29/2020 08/26/2020  Glucose 70 - 99 mg/dL 72 100(H) 74  BUN 8 - 23 mg/dL 14 14 12   Creatinine 0.44 - 1.00 mg/dL 1.04(H) 1.16(H) 1.18(H)  Sodium 135 - 145 mmol/L 141 145 141  Potassium 3.5 - 5.1 mmol/L 3.7 3.8 4.2  Chloride 98 - 111 mmol/L 101 104 102  CO2 22 - 32 mmol/L 28 30 24   Calcium 8.9 - 10.3 mg/dL 9.3 9.5 9.3  Total Protein 6.5 - 8.1 g/dL 7.2 7.0 6.8  Total Bilirubin 0.3 - 1.2 mg/dL 0.3 0.3 <0.2  Alkaline Phos 38 - 126 U/L 45 51 59  AST 15 - 41 U/L 47(H) 25 29  ALT 0 - 44 U/L 19 10 11     RADIOGRAPHIC STUDIES: DG Chest 2 View  Result  Date: 08/29/2020 CLINICAL DATA:  Cough question pneumonia; history of non-small cell lung cancer RIGHT lung post radiation therapy EXAM: CHEST - 2 VIEW COMPARISON:  09/27/2020 FINDINGS: RIGHT jugular Port-A-Cath with tip projecting over SVC. Normal heart size, mediastinal contours, and pulmonary vascularity. Chronic volume loss in the RIGHT hemithorax with elevation of the diaphragm and slight mediastinal shift the a shin to the RIGHT. Persistent perihilar opacities RIGHT lung unchanged likely related to prior radiation therapy. No acute infiltrate, pleural effusion, or pneumothorax. No acute osseous findings. IMPRESSION: Chronic RIGHT perihilar opacities and RIGHT hemithorax volume loss likely related to prior treatment for non-small cell lung cancer RIGHT lung. No acute abnormalities. Electronically Signed   By: Lavonia Dana M.D.   On: 08/29/2020 18:27    ASSESSMENT & PLAN Elizabeth Mayer 60 y.o. female with medical history significant for metastatic adenocarcinoma of the lung who presents for follow up.    After review the labs, the records, discussion with the patient the findings are most consistent with metastatic adenocarcinoma the lung without any targetable mutations.  The patient has been started on carboplatin pemetrexed pembrolizumab every 3 weeks with Dr. Julien Nordmann, however the patient has expressed a desire to transition to another provider.  As such we were happy to take up her care here.  She already discontinued the carboplatin and is on maintenance pemetrexed/pembrolizumab therapy. We assumed her care with Cycle 6 on 04/04/2020.  Patient returns today  for a follow up due to progressive fatigue, mid back pain and episodes of dizziness/presyncope. Labs from today were reviewed without any intervention needed. UA was unremarkable for UTI. Plan to obtain repeat CT imaging to rule out progressive disease. In addition, patient is scheduled for a brain MRI on 09/10/2020.   # Metastatic Adenocarcinoma  of the Lung #Brain Metastasis --Most recent scan from 07/17/2020 showed the patient is having an appropriate response to treatment with Carbo/Pem/Pem chemotherapy.   --patient had a brief 2 week chemotherapy holiday due to fatigue and poor labs prior to Cycle 12.  --no targetable mutations noted on prior workup. --continue to follow with Dr. Laqueta Due Onc for metastatic spread to the brain.  --can consider docetaxel/ramucirumab at time of progression.  --MRI brain on 09/10/2020 and we will request repeat CT CAP in one week to evaluate for progressive disease.   # Fatigue: --Uncertain etiology but differentials include chemotherapy/immunotherapy side effects and progressive disease.  --Monitor closely  # Mid back pain: --Evaluate further with upcoming CT scan.  # Dizziness/Presyncope: --Evaluate further with upcoming brain MRI scheduled for 09/10/2020.   Orders Placed This Encounter  Procedures  . Culture, Urine    Standing Status:   Future    Number of Occurrences:   1    Standing Expiration Date:   09/04/2021  . CT CHEST ABDOMEN PELVIS W CONTRAST    Standing Status:   Future    Standing Expiration Date:   09/04/2021    Order Specific Question:   If indicated for the ordered procedure, I authorize the administration of contrast media per Radiology protocol    Answer:   Yes    Order Specific Question:   Preferred imaging location?    Answer:   [facility]    Order Specific Question:   Release to patient    Answer:   Immediate    Order Specific Question:   Is Oral Contrast requested for this exam?    Answer:   Yes, Per Radiology protocol    Order Specific Question:   Reason for Exam (SYMPTOM  OR DIAGNOSIS REQUIRED)    Answer:   metastatic lung caner.  Marland Kitchen CBC with Differential (Cancer Center Only)    Standing Status:   Future    Number of Occurrences:   1    Standing Expiration Date:   09/04/2021  . CMP ([facility] only)    Standing Status:   Future    Number of Occurrences:    1    Standing Expiration Date:   09/04/2021  . Urinalysis, Complete w Microscopic    Standing Status:   Future    Number of Occurrences:   1    Standing Expiration Date:   09/04/2021    All questions were answered. The patient knows to call the clinic with any problems, questions or concerns.  I have spent a total of 35 minutes minutes of face-to-face and non-face-to-face time, preparing to see the patient, obtaining and/or reviewing separately obtained history, performing a medically appropriate examination, counseling and educating the patient, ordering tests, documenting clinical information in the electronic health record and care coordination.    Lincoln Brigham PA-C   Department of Hematology/Oncology [place] at [facility] Phone: [phone]  09/04/2020 3:24 PM   Patient was seen with Dr. Lorenso Courier.   I have read the above note and personally examined the patient. I agree with the assessment and plan as noted above.  Briefly Mrs.  Mayer presents with worsening fatigue after receiving chemotherapy (cycle 12) last week.  She is not having any focal symptoms such as fevers, chills, sweats, nausea, vomiting or diarrhea.  It is a generalized fatigue that she is experiencing which was present prior to the administration of the chemotherapy, but has subsequently worsened.  Recommend proceeding with repeat brain MRI and CT scan of the chest abdomen pelvis in order to assess for progressive disease.   Ledell Peoples, MD Department of Hematology/Oncology [place] at [facility] Phone: [phone] Pager: [phone] Email: [email or web address]

## 2020-09-05 ENCOUNTER — Other Ambulatory Visit: Payer: BC Managed Care – PPO

## 2020-09-05 ENCOUNTER — Ambulatory Visit: Payer: BC Managed Care – PPO | Admitting: Hematology and Oncology

## 2020-09-05 ENCOUNTER — Ambulatory Visit: Payer: BC Managed Care – PPO

## 2020-09-05 LAB — URINE CULTURE: Culture: <10000 — AB

## 2020-09-06 ENCOUNTER — Ambulatory Visit (HOSPITAL_COMMUNITY): Payer: BC Managed Care – PPO

## 2020-09-06 ENCOUNTER — Encounter (HOSPITAL_COMMUNITY): Payer: Self-pay | Admitting: *Deleted

## 2020-09-06 ENCOUNTER — Telehealth: Payer: Self-pay | Admitting: Internal Medicine

## 2020-09-06 NOTE — Telephone Encounter (Signed)
Scheduled appt per 6/2 sch msg. Called pt, no answer. Left msg with appt date and time.

## 2020-09-09 ENCOUNTER — Other Ambulatory Visit: Payer: Self-pay | Admitting: *Deleted

## 2020-09-09 ENCOUNTER — Other Ambulatory Visit: Payer: Self-pay

## 2020-09-09 ENCOUNTER — Encounter (HOSPITAL_COMMUNITY): Payer: Self-pay | Admitting: *Deleted

## 2020-09-09 ENCOUNTER — Encounter (HOSPITAL_COMMUNITY): Payer: BC Managed Care – PPO

## 2020-09-09 ENCOUNTER — Telehealth: Payer: Self-pay | Admitting: *Deleted

## 2020-09-09 ENCOUNTER — Encounter: Payer: Self-pay | Admitting: Physician Assistant

## 2020-09-09 ENCOUNTER — Encounter: Payer: Self-pay | Admitting: Internal Medicine

## 2020-09-09 DIAGNOSIS — M7989 Other specified soft tissue disorders: Secondary | ICD-10-CM

## 2020-09-09 NOTE — Telephone Encounter (Signed)
Received call from patient stating that she is having more swelling in her legs and more in the left leg than the right. Also c/o tenderness/pain in her left ankle /leg as well. No h/o blood clots.  Dr. Lorenso Courier made aware.

## 2020-09-09 NOTE — Progress Notes (Signed)
Received call from patient saying her ride just cancelled on her last minute to bring her to the hospital. The person bringing her was also the one to stay with her for 24hrs after anesthesia. OR desk notified, MRI notified. Patient will reach out to PCP to have them reschedule procedure.

## 2020-09-09 NOTE — Progress Notes (Addendum)
Elizabeth Mayer denies chest pain , patient has shortness of breath , "due to Lung Cancer"- she uses Oxygen at 2Ll continuously. Elizabeth Mayer denies s/s of Covid and has not been exposed to anyone with Covid to her knowledge.

## 2020-09-10 ENCOUNTER — Telehealth: Payer: Self-pay | Admitting: *Deleted

## 2020-09-10 ENCOUNTER — Ambulatory Visit (HOSPITAL_COMMUNITY): Admission: RE | Admit: 2020-09-10 | Payer: BC Managed Care – PPO | Source: Home / Self Care

## 2020-09-10 ENCOUNTER — Ambulatory Visit (HOSPITAL_COMMUNITY): Admission: RE | Admit: 2020-09-10 | Payer: BC Managed Care – PPO | Source: Ambulatory Visit

## 2020-09-10 ENCOUNTER — Encounter (HOSPITAL_COMMUNITY): Admission: RE | Payer: Self-pay | Source: Home / Self Care

## 2020-09-10 SURGERY — MRI WITH ANESTHESIA
Anesthesia: General

## 2020-09-10 NOTE — Telephone Encounter (Signed)
Patient called to report she had to cancel her scheduled MRI with sedation because her transportation fell through.  Routed request to centralized scheduling to see if they can reach out to patient to reschedule.

## 2020-09-11 ENCOUNTER — Other Ambulatory Visit: Payer: Self-pay

## 2020-09-11 ENCOUNTER — Ambulatory Visit (HOSPITAL_COMMUNITY)
Admission: RE | Admit: 2020-09-11 | Discharge: 2020-09-11 | Disposition: A | Discharge status: Home / Self Care | Payer: BC Managed Care – PPO | Source: Ambulatory Visit | Attending: Hematology and Oncology | Admitting: Hematology and Oncology

## 2020-09-11 DIAGNOSIS — M7989 Other specified soft tissue disorders: Secondary | ICD-10-CM | POA: Diagnosis not present

## 2020-09-11 NOTE — Progress Notes (Signed)
Lower extremity venous has been completed.   Preliminary results in CV Proc.   Abram Sander 09/11/2020 9:23 AM

## 2020-09-12 ENCOUNTER — Ambulatory Visit (HOSPITAL_COMMUNITY)
Admission: RE | Admit: 2020-09-12 | Discharge: 2020-09-12 | Disposition: A | Discharge status: Home / Self Care | Payer: BC Managed Care – PPO | Source: Ambulatory Visit | Attending: Physician Assistant | Admitting: Physician Assistant

## 2020-09-12 ENCOUNTER — Telehealth (HOSPITAL_COMMUNITY): Payer: Self-pay

## 2020-09-12 ENCOUNTER — Telehealth: Payer: Self-pay | Admitting: *Deleted

## 2020-09-12 DIAGNOSIS — C3491 Malignant neoplasm of unspecified part of right bronchus or lung: Secondary | ICD-10-CM

## 2020-09-12 DIAGNOSIS — M47814 Spondylosis without myelopathy or radiculopathy, thoracic region: Secondary | ICD-10-CM | POA: Diagnosis not present

## 2020-09-12 DIAGNOSIS — M5137 Other intervertebral disc degeneration, lumbosacral region: Secondary | ICD-10-CM | POA: Diagnosis not present

## 2020-09-12 DIAGNOSIS — I313 Pericardial effusion (noninflammatory): Secondary | ICD-10-CM | POA: Diagnosis not present

## 2020-09-12 DIAGNOSIS — R0600 Dyspnea, unspecified: Secondary | ICD-10-CM | POA: Diagnosis not present

## 2020-09-12 DIAGNOSIS — R5383 Other fatigue: Secondary | ICD-10-CM | POA: Diagnosis not present

## 2020-09-12 DIAGNOSIS — D259 Leiomyoma of uterus, unspecified: Secondary | ICD-10-CM | POA: Diagnosis not present

## 2020-09-12 DIAGNOSIS — C3490 Malignant neoplasm of unspecified part of unspecified bronchus or lung: Secondary | ICD-10-CM | POA: Diagnosis not present

## 2020-09-12 MED ORDER — SODIUM CHLORIDE (PF) 0.9 % IJ SOLN
INTRAMUSCULAR | Status: AC
  Filled 2020-09-12: qty 50

## 2020-09-12 MED ORDER — HEPARIN SOD (PORK) LOCK FLUSH 100 UNIT/ML IV SOLN: 500.0000 [IU] | Freq: Once | INTRAVENOUS | Status: DC

## 2020-09-12 MED ORDER — HEPARIN SOD (PORK) LOCK FLUSH 100 UNIT/ML IV SOLN
INTRAVENOUS | Status: AC
  Administered 2020-09-12: 500 [IU]
  Filled 2020-09-12: qty 5

## 2020-09-12 MED ORDER — IOHEXOL 300 MG/ML  SOLN
75.0000 mL | Freq: Once | INTRAMUSCULAR | Status: AC | PRN
  Administered 2020-09-12: 75 mL via INTRAVENOUS

## 2020-09-12 NOTE — Telephone Encounter (Signed)
TCT patient regarding her recent doppler study of her lower extremities.Marland Kitchen spoke with her and advised that she did not have any blood clots in her legs.  Pt relieved to hear this. She states she still has swelling in her legs and it is very difficult to stand up very long. She is aware of her upcoming appts with Dr. Lorenso Courier and for labs/treatment.

## 2020-09-12 NOTE — Telephone Encounter (Signed)
-----   Message from Orson Slick, MD sent at 09/11/2020 10:09 AM EDT ----- Please let Mrs. Mcgonagle know that her doppler showed no evidence of a DVT in either leg.  ----- Message ----- From: Interface, [employer] Sent: 09/11/2020   9:23 AM EDT To: Orson Slick, MD

## 2020-09-17 ENCOUNTER — Ambulatory Visit: Payer: BC Managed Care – PPO | Admitting: Internal Medicine

## 2020-09-18 DIAGNOSIS — L700 Acne vulgaris: Secondary | ICD-10-CM | POA: Diagnosis not present

## 2020-09-19 ENCOUNTER — Inpatient Hospital Stay: Payer: BC Managed Care – PPO

## 2020-09-19 ENCOUNTER — Inpatient Hospital Stay (HOSPITAL_BASED_OUTPATIENT_CLINIC_OR_DEPARTMENT_OTHER): Payer: BC Managed Care – PPO | Admitting: Hematology and Oncology

## 2020-09-19 ENCOUNTER — Other Ambulatory Visit: Payer: Self-pay

## 2020-09-19 VITALS — BP 156/73 | HR 85 | Temp 97.4°F | Resp 17 | Ht 64.4 in | Wt 146.5 lb

## 2020-09-19 DIAGNOSIS — R5383 Other fatigue: Secondary | ICD-10-CM

## 2020-09-19 DIAGNOSIS — C3491 Malignant neoplasm of unspecified part of right bronchus or lung: Secondary | ICD-10-CM

## 2020-09-19 DIAGNOSIS — C7931 Secondary malignant neoplasm of brain: Secondary | ICD-10-CM

## 2020-09-19 DIAGNOSIS — Z95828 Presence of other vascular implants and grafts: Secondary | ICD-10-CM

## 2020-09-19 LAB — CBC WITH DIFFERENTIAL (CANCER CENTER ONLY)
Abs Immature Granulocytes: 0.02 10*3/uL (ref 0.00–0.07)
Basophils Absolute: 0 10*3/uL (ref 0.0–0.1)
Basophils Relative: 1 %
Eosinophils Absolute: 0.2 10*3/uL (ref 0.0–0.5)
Eosinophils Relative: 6 %
HCT: 29.1 % — ABNORMAL LOW (ref 36.0–46.0)
Hemoglobin: 9.5 g/dL — ABNORMAL LOW (ref 12.0–15.0)
Immature Granulocytes: 1 %
Lymphocytes Relative: 22 %
Lymphs Abs: 0.8 10*3/uL (ref 0.7–4.0)
MCH: 31 pg (ref 26.0–34.0)
MCHC: 32.6 g/dL (ref 30.0–36.0)
MCV: 95.1 fL (ref 80.0–100.0)
Monocytes Absolute: 0.4 10*3/uL (ref 0.1–1.0)
Monocytes Relative: 10 %
Neutro Abs: 2.2 10*3/uL (ref 1.7–7.7)
Neutrophils Relative %: 60 %
Platelet Count: 155 10*3/uL (ref 150–400)
RBC: 3.06 MIL/uL — ABNORMAL LOW (ref 3.87–5.11)
RDW: 13.8 % (ref 11.5–15.5)
WBC Count: 3.6 10*3/uL — ABNORMAL LOW (ref 4.0–10.5)
nRBC: 0 % (ref 0.0–0.2)

## 2020-09-19 LAB — CMP (CANCER CENTER ONLY)
ALT: 13 U/L (ref 0–44)
AST: 25 U/L (ref 15–41)
Albumin: 3.5 g/dL (ref 3.5–5.0)
Alkaline Phosphatase: 53 U/L (ref 38–126)
Anion gap: 10 (ref 5–15)
BUN: 7 mg/dL — ABNORMAL LOW (ref 8–23)
CO2: 27 mmol/L (ref 22–32)
Calcium: 9.2 mg/dL (ref 8.9–10.3)
Chloride: 106 mmol/L (ref 98–111)
Creatinine: 1.04 mg/dL — ABNORMAL HIGH (ref 0.44–1.00)
GFR, Estimated: >60 mL/min (ref >60)
Glucose, Bld: 130 mg/dL — ABNORMAL HIGH (ref 70–99)
Potassium: 3.2 mmol/L — ABNORMAL LOW (ref 3.5–5.1)
Sodium: 143 mmol/L (ref 135–145)
Total Bilirubin: 0.3 mg/dL (ref 0.3–1.2)
Total Protein: 6.9 g/dL (ref 6.5–8.1)

## 2020-09-19 LAB — TSH: TSH: 2.457 u[IU]/mL (ref 0.308–3.960)

## 2020-09-19 LAB — MAGNESIUM: Magnesium: 1.7 mg/dL (ref 1.7–2.4)

## 2020-09-19 MED ORDER — HEPARIN SOD (PORK) LOCK FLUSH 100 UNIT/ML IV SOLN
500.0000 [IU] | Freq: Once | INTRAVENOUS | Status: AC | PRN
  Administered 2020-09-19: 500 [IU]
  Filled 2020-09-19: qty 5

## 2020-09-19 MED ORDER — SODIUM CHLORIDE 0.9% FLUSH
10.0000 mL | INTRAVENOUS | Status: DC | PRN
  Administered 2020-09-19: 10 mL
  Filled 2020-09-19: qty 10

## 2020-09-19 MED ORDER — SODIUM CHLORIDE 0.9% FLUSH
10.0000 mL | Freq: Once | INTRAVENOUS | Status: AC
  Administered 2020-09-19: 10 mL
  Filled 2020-09-19: qty 10

## 2020-09-21 ENCOUNTER — Encounter: Payer: Self-pay | Admitting: Internal Medicine

## 2020-09-21 ENCOUNTER — Encounter: Payer: Self-pay | Admitting: Physician Assistant

## 2020-09-21 NOTE — Progress Notes (Signed)
Elizabeth Mayer   [phone]  PROGRESS NOTE  Patient Care Team: Minette Brine, FNP as PCP - General (General Practice)  Hematological/Oncological History # Metastatic Adenocarcinoma of the Lung #Brain Metastasis 1) 11/08/2019: establish care with Dr. Julien Nordmann and his PA Cassie Heilingoetter. Noted to have widely metastatic lung cancer with involvement of the brain, lymph nodes, and right lung.  2) 12/04/2019: palliative radiation to the right lung mass/cervical adenopathy 3) 9/7/02021: started therapy with Carbo/Pem/Pem 4) 9/29-10/09/2019: patient underwent SRS to the brain mets 5) 03/22/2020: repeat CT C/A/P and neck scheduled. Transfer care to Dr. Lorenso Courier.   6) 03/22/2020: CT C/A/P showed interval significant improvement in previously demonstrated extensive confluent lymphadenopathy in the superior mediastinum and right hilar regions. 7) 08/15/2020: Holding chemotherapy due to poor Hgb and fatigue. Allowing patient a brief 'chemo holiday' to rebound appropriately from last cycle.  8) 08/29/2020: restarted chemotherapy, Cycle 12 Day 1 09/19/2020: patient requested to HOLD Cycle 13 in setting of fatigue/weakness.   Interval History:  Elizabeth Mayer 60 y.o. female with medical history significant for metastatic adenocarcinoma of the lung who presents for follow up. The patient's last visit was on 08/29/2020 when she received Cycle 12, Day 1 of carbo/pem/pem.   On exam today Elizabeth Mayer reports that she is "not doing great".  She notes that she is quite sore above her ankles and is having some pains on her left side.  She reports that her last cycle of chemotherapy kept her "nearly bedbound" for several days.  She is not having any focal nausea, vomiting, or diarrhea but was reporting being "off balance". She notes episodes of dizziness and presyncope.  She reports that her energy is "so-so".  She continues to have an appetite and her weight has been steady.   Patient denies any headaches, vision changes or other focal deficits.  She is on 3 L of oxygen and notes that this level has been stable.  She denies any fevers, chills, night sweats, chest pain or cough. She has no other complaints.  A full 10 point ROS is listed below.  MEDICAL HISTORY:  Past Medical History:  Diagnosis Date   Anemia    Angina 1982   related to stress   Asthma    in the past,  no current problems   Complication of anesthesia    states anesthesia made her hair fall out   Constipation    Dyspnea    on oxygen at home - 2L via [place]   Fatigue    GERD (gastroesophageal reflux disease)    patient denies this dx   Headache    Heart murmur 1970s   no problems currently   Ingrown toenail    Lung cancer ([place]) 10/2019   metastatic disease to the brain   On home oxygen therapy    2.2 lpm, 24 hours a day   Past heart attack 1980-1981   pt states she passed out and woke up in hospital- told she had heart attack, but then dr said he couldn't find anything wrong.   Pneumonia    x 1    SURGICAL HISTORY: Past Surgical History:  Procedure Laterality Date   CERVICAL DISC SURGERY  2000   Disc removed from neck    ingrown toe nail surgery Bilateral    IR IMAGING GUIDED PORT INSERTION  02/16/2020   MULTIPLE TOOTH EXTRACTIONS     for braces   RADIOLOGY WITH ANESTHESIA N/A 12/05/2019   Procedure: MRI  BRAIN WITH AND WITHOUT CONTRAST;  Surgeon: Radiologist, Medication, MD;  Location: [place];  Service: Radiology;  Laterality: N/A;   RADIOLOGY WITH ANESTHESIA N/A 01/02/2020   Procedure: MRI BRAIN WITH AND WITHOUT CONTRAST;  Surgeon: Radiologist, Medication, MD;  Location: [place];  Service: Radiology;  Laterality: N/A;   RADIOLOGY WITH ANESTHESIA N/A 02/20/2020   Procedure: MRI WITH ANESTHESIA OF BRAIN WITH AND WITHOUT CONTRAST;  Surgeon: Radiologist, Medication, MD;  Location: [place];  Service: Radiology;  Laterality: N/A;   RADIOLOGY WITH ANESTHESIA N/A 06/11/2020   Procedure: MRI WITH  ANESTHESIA OF BRAIN WITH AND WITHOUT CONTRAST;  Surgeon: Radiologist, Medication, MD;  Location: [place];  Service: Radiology;  Laterality: N/A;   TONSILLECTOMY      SOCIAL HISTORY: Social History   Socioeconomic History   Marital status: Married    Spouse name: Not on file   Number of children: 0   Years of education: Not on file   Highest education level: Not on file  Occupational History   Occupation: Secondary school teacher  Tobacco Use   Smoking status: Former    Packs/day: 1.00    Years: 20.00    Pack years: 20.00    Types: Cigarettes    Start date: 62    Quit date: 2001    Years since quitting: 21.4   Smokeless tobacco: Never  Vaping Use   Vaping Use: Never used  Substance and Sexual Activity   Alcohol use: No   Drug use: No   Sexual activity: Yes    Birth control/protection: None, Post-menopausal  Other Topics Concern   Not on file  Social History Narrative   Not on file   Social Determinants of Health   Financial Resource Strain: High Risk   Difficulty of Paying Living Expenses: Hard  Food Insecurity: No Food Insecurity   Worried About Running Out of Food in the Last Year: Never true   Ran Out of Food in the Last Year: Never true  Transportation Needs: No Transportation Needs   Lack of Transportation (Medical): No   Lack of Transportation (Non-Medical): No  Physical Activity: Inactive   Days of Exercise per Week: 0 days   Minutes of Exercise per Session: 0 min  Stress: Not on file  Social Connections: Moderately Isolated   Frequency of Communication with Friends and Family: Twice a week   Frequency of Social Gatherings with Friends and Family: Twice a week   Attends Religious Services: More than 4 times per year   Active Member of [employer] or Organizations: No   Attends Music therapist: Not on file   Marital Status: Separated  Intimate Partner Violence: Not on file    FAMILY HISTORY: Family History  Problem Relation Age of Onset   Arthritis  Mother    Heart failure Father     ALLERGIES:  is allergic to pseudoephedrine, gabapentin, mobic [meloxicam], and penicillins.  MEDICATIONS:  Current Outpatient Medications  Medication Sig Dispense Refill   acetaminophen (TYLENOL) 500 MG tablet Take 1,000 mg by mouth every 6 (six) hours as needed for moderate pain.      albuterol (VENTOLIN HFA) 108 (90 Base) MCG/ACT inhaler Inhale 2 puffs into the lungs every 6 (six) hours as needed for wheezing or shortness of breath. 18 g 6   budesonide-formoterol (SYMBICORT) 80-4.5 MCG/ACT inhaler Inhale 2 puffs into the lungs in the morning and at bedtime. 1 each 11   camphor-menthol (SARNA) lotion Apply 1 application topically as needed for itching.  Carboxymethylcellulose Sodium (REFRESH CELLUVISC OP) Place 1-2 drops into both eyes See admin instructions. Instill 1 drop into both eyes twice daily (scheduled) & then use as needed throughout the day for dry/irritated eyes     Chlorphen-PE-Acetaminophen (NOREL AD PO) Take 1 tablet by mouth at bedtime.     fexofenadine (ALLEGRA) 180 MG tablet Take 180 mg by mouth in the morning.     fluticasone (FLONASE) 50 MCG/ACT nasal spray Place 1 spray into both nostrils at bedtime.     folic acid (FOLVITE) 1 MG tablet Take 1 tablet (1 mg total) by mouth daily. (Patient taking differently: Take 1 mg by mouth in the morning.) 90 tablet 2   guaiFENesin (MUCINEX) 600 MG 12 hr tablet Take 600 mg by mouth 2 (two) times daily.      HYDROcodone-acetaminophen (HYCET) 7.5-325 mg/15 ml solution Take 10 mLs by mouth 2 (two) times daily. For pain from cancer and radiation reactions (Patient taking differently: Take 10 mLs by mouth 2 (two) times daily as needed for moderate pain or severe pain. For pain from cancer and radiation reactions) 473 mL 0   lidocaine (XYLOCAINE) 2 % solution Use as directed 15 mLs in the mouth or throat as needed for mouth pain. Swallow 30 min prior to meals and at bedtime for throat/esophagus pain  (Patient not taking: Reported on 09/04/2020) 250 mL 1   lidocaine-prilocaine (EMLA) cream Apply 1 application topically as needed. (Patient taking differently: Apply 1 application topically as needed (port access).) 30 g 2   LORazepam (ATIVAN) 0.5 MG tablet Please take 1 tablet 30 minutes before your scan as needed for anxiety (Patient not taking: Reported on 09/04/2020) 2 tablet 0   Multiple Vitamin (MULTIVITAMIN WITH MINERALS) TABS tablet Take 1 tablet by mouth daily.     OXYGEN Inhale 3 L/min into the lungs continuous.     potassium chloride 20 MEQ/15ML (10%) SOLN Take 15 mLs (20 mEq total) by mouth 2 (two) times daily. (Patient not taking: Reported on 09/04/2020) 210 mL 0   prochlorperazine (COMPAZINE) 10 MG tablet Take 1 tablet (10 mg total) by mouth every 6 (six) hours as needed. (Patient taking differently: Take 10 mg by mouth every 6 (six) hours as needed (nausea with chemo).) 30 tablet 2   triamcinolone ointment (KENALOG) 0.1 % Apply 1 application topically in the morning and at bedtime.     Current Facility-Administered Medications  Medication Dose Route Frequency Provider Last Rate Last Admin   sodium chloride flush (NS) 0.9 % injection 10 mL  10 mL Intracatheter PRN Orson Slick, MD   10 mL at 09/19/20 1032    REVIEW OF SYSTEMS:   Constitutional: ( - ) fevers, ( - )  chills , ( - ) night sweats Eyes: ( - ) blurriness of vision, ( - ) double vision, ( - ) watery eyes Ears, nose, mouth, throat, and face: ( - ) mucositis, ( - ) sore throat Respiratory: ( - ) cough, ( - ) dyspnea, ( - ) wheezes Cardiovascular: ( - ) palpitation, ( - ) chest discomfort, ( - ) lower extremity swelling Gastrointestinal:  ( - ) nausea, ( - ) heartburn, ( - ) change in bowel habits Skin: ( - ) abnormal skin rashes Lymphatics: ( - ) new lymphadenopathy, ( - ) easy bruising Neurological: ( - ) numbness, ( - ) tingling, ( - ) new weaknesses Behavioral/Psych: ( - ) mood change, ( - ) new changes  All other  systems were reviewed with the patient and are negative.  PHYSICAL EXAMINATION: ECOG PERFORMANCE STATUS: 3 - Symptomatic, >50% confined to bed  Vitals:   09/19/20 0959  BP: (!) 156/73  Pulse: 85  Resp: 17  Temp: (!) 97.4 F (36.3 C)  SpO2: 100%   Filed Weights   09/19/20 0959  Weight: 146 lb 8 oz (66.5 kg)    GENERAL: well appearing middle aged [place] American female. alert, no distress and comfortable SKIN: skin color, texture, turgor are normal, no rashes or significant lesions EYES: conjunctiva are pink and non-injected, sclera clear LUNGS: clear to auscultation and percussion with normal breathing effort HEART: regular rate & rhythm and no murmurs and no lower extremity edema Musculoskeletal: no cyanosis of digits and no clubbing.  PSYCH: alert & oriented x 3, fluent speech NEURO: no focal motor/sensory deficits   LABORATORY DATA:  I have reviewed the data as listed CBC Latest Ref Rng & Units 09/19/2020 09/04/2020 08/29/2020  WBC 4.0 - 10.5 K/uL 3.6(L) 3.4(L) 4.6  Hemoglobin 12.0 - 15.0 g/dL 9.5(L) 9.9(L) 9.9(L)  Hematocrit 36.0 - 46.0 % 29.1(L) 31.5(L) 30.2(L)  Platelets 150 - 400 K/uL 155 138(L) 179    CMP Latest Ref Rng & Units 09/19/2020 09/04/2020 08/29/2020  Glucose 70 - 99 mg/dL 130(H) 72 100(H)  BUN 8 - 23 mg/dL 7(L) 14 14  Creatinine 0.44 - 1.00 mg/dL 1.04(H) 1.04(H) 1.16(H)  Sodium 135 - 145 mmol/L 143 141 145  Potassium 3.5 - 5.1 mmol/L 3.2(L) 3.7 3.8  Chloride 98 - 111 mmol/L 106 101 104  CO2 22 - 32 mmol/L 27 28 30   Calcium 8.9 - 10.3 mg/dL 9.2 9.3 9.5  Total Protein 6.5 - 8.1 g/dL 6.9 7.2 7.0  Total Bilirubin 0.3 - 1.2 mg/dL 0.3 0.3 0.3  Alkaline Phos 38 - 126 U/L 53 45 51  AST 15 - 41 U/L 25 47(H) 25  ALT 0 - 44 U/L 13 19 10     RADIOGRAPHIC STUDIES: DG Chest 2 View  Result Date: 08/29/2020 CLINICAL DATA:  Cough question pneumonia; history of non-small cell lung cancer RIGHT lung post radiation therapy EXAM: CHEST - 2 VIEW COMPARISON:  09/27/2020  FINDINGS: RIGHT jugular Port-A-Cath with tip projecting over SVC. Normal heart size, mediastinal contours, and pulmonary vascularity. Chronic volume loss in the RIGHT hemithorax with elevation of the diaphragm and slight mediastinal shift the a shin to the RIGHT. Persistent perihilar opacities RIGHT lung unchanged likely related to prior radiation therapy. No acute infiltrate, pleural effusion, or pneumothorax. No acute osseous findings. IMPRESSION: Chronic RIGHT perihilar opacities and RIGHT hemithorax volume loss likely related to prior treatment for non-small cell lung cancer RIGHT lung. No acute abnormalities. Electronically Signed   By: Lavonia Dana M.D.   On: 08/29/2020 18:27   CT CHEST ABDOMEN PELVIS W CONTRAST  Result Date: 09/15/2020 CLINICAL DATA:  Metastatic non-small cell lung cancer. Worsening dyspnea, fatigue and back pain, on oxygen therapy. Restaging. EXAM: CT CHEST, ABDOMEN, AND PELVIS WITH CONTRAST TECHNIQUE: Multidetector CT imaging of the chest, abdomen and pelvis was performed following the standard protocol during bolus administration of intravenous contrast. CONTRAST:  84mL OMNIPAQUE IOHEXOL 300 MG/ML  SOLN COMPARISON:  07/15/2020 CT chest, abdomen and pelvis. FINDINGS: CT CHEST FINDINGS Cardiovascular: Normal heart size. Stable trace pericardial effusion/thickening. Right internal jugular Port-A-Cath terminates near the cavoatrial junction. Atherosclerotic nonaneurysmal thoracic aorta. Top-normal caliber main pulmonary artery (3.2 cm diameter). No central pulmonary emboli. Mediastinum/Nodes: No discrete thyroid nodules. Unremarkable esophagus. No pathologically enlarged axillary nodes.  Enlarged short axis diameter 1.6 cm left supraclavicular node (series 2/image 3), previously 1.7 cm, not appreciably changed. Mildly enlarged 1.1 cm short axis diameter right paratracheal node (series 2/image 15), previously 1.1 cm using similar measurement technique, stable. No new pathologically enlarged  mediastinal nodes. Mildly enlarged 1.4 cm short axis diameter right infrahilar node (series 2/image 25), previously 1.5 cm, not appreciably changed. No new pathologically enlarged hilar nodes. Lungs/Pleura: No pneumothorax. No pleural effusion. Moderate centrilobular emphysema. Dense patchy sharply marginated consolidation throughout the perihilar right lung with associated bronchiectasis, volume loss and distortion, compatible with evolving postradiation change. New indistinct 0.7 cm nodular focus of consolidation in the peripheral superior segment right lower lobe (series 4/image 60). Similar bandlike regions of consolidation at both lung apices compatible with evolving postradiation change. No additional new significant pulmonary nodules. Musculoskeletal: No aggressive appearing focal osseous lesions. Mild thoracic spondylosis. CT ABDOMEN PELVIS FINDINGS Hepatobiliary: Normal liver with no liver mass. Normal gallbladder with no radiopaque cholelithiasis. No biliary ductal dilatation. Pancreas: Normal, with no mass or duct dilation. Spleen: Normal size. No mass. Adrenals/Urinary Tract: Normal adrenals. Normal kidneys with no hydronephrosis and no renal mass. Normal bladder. Stomach/Bowel: Normal non-distended stomach. Normal caliber small bowel with no small bowel wall thickening. Normal appendix. Oral contrast transits to the colon. Normal large bowel with no diverticulosis, large bowel wall thickening or pericolonic fat stranding. Vascular/Lymphatic: Atherosclerotic nonaneurysmal abdominal aorta. Patent portal, splenic, hepatic and renal veins. No pathologically enlarged lymph nodes in the abdomen or pelvis. Reproductive: Stable enlarged myomatous uterus with dominant anterior uterine body 4.3 cm fibroid. No adnexal masses. Other: No pneumoperitoneum, ascites or focal fluid collection. Musculoskeletal: No aggressive appearing focal osseous lesions. Marked degenerative disc disease at L5-S1. IMPRESSION: 1. New  indistinct 0.7 cm nodular focus of consolidation in the peripheral superior segment right lower lobe, potentially inflammatory/treatment related. Recommend attention on follow-up chest CT in 3 months. 2. Otherwise no potential findings of new or progressive metastatic disease. Stable mild left supraclavicular, right paratracheal and right infrahilar adenopathy. 3. Otherwise expected evolution of postradiation change in the perihilar right lung and bilateral lung apices. 4. Stable enlarged myomatous uterus. 5. Aortic Atherosclerosis (ICD10-I70.0) and Emphysema (ICD10-J43.9). Electronically Signed   By: Ilona Sorrel M.D.   On: 09/15/2020 13:17   VAS [place] LOWER EXTREMITY VENOUS (DVT)  Result Date: 09/11/2020  Lower Venous DVT Study Patient Name:  Elizabeth Mayer  Date of Exam:   09/11/2020 Medical Rec #: [id number]        Accession #:    [id number] Date of Birth: 04-20-1960         Patient Gender: F Patient Age:   59Y Exam Location:  [facility] Procedure:      VAS [place] LOWER EXTREMITY VENOUS (DVT) Referring Phys: [id number] Elizabeth Mayer IV --------------------------------------------------------------------------------  Indications: Swelling.  Comparison Study: no prior Performing Technologist: Archie Patten RVS  Examination Guidelines: A complete evaluation includes B-mode imaging, spectral Doppler, color Doppler, and power Doppler as needed of all accessible portions of each vessel. Bilateral testing is considered an integral part of a complete examination. Limited examinations for reoccurring indications may be performed as noted. The reflux portion of the exam is performed with the patient in reverse Trendelenburg.  +---------+---------------+---------+-----------+----------+--------------+ RIGHT    CompressibilityPhasicitySpontaneityPropertiesThrombus Aging +---------+---------------+---------+-----------+----------+--------------+ CFV      Full           Yes      Yes                                  +---------+---------------+---------+-----------+----------+--------------+  SFJ      Full                                                        +---------+---------------+---------+-----------+----------+--------------+ FV Prox  Full                                                        +---------+---------------+---------+-----------+----------+--------------+ FV Mid   Full                                                        +---------+---------------+---------+-----------+----------+--------------+ FV DistalFull                                                        +---------+---------------+---------+-----------+----------+--------------+ PFV      Full                                                        +---------+---------------+---------+-----------+----------+--------------+ POP      Full           Yes      Yes                                 +---------+---------------+---------+-----------+----------+--------------+ PTV      Full                                                        +---------+---------------+---------+-----------+----------+--------------+ PERO     Full                                                        +---------+---------------+---------+-----------+----------+--------------+   +---------+---------------+---------+-----------+----------+--------------+ LEFT     CompressibilityPhasicitySpontaneityPropertiesThrombus Aging +---------+---------------+---------+-----------+----------+--------------+ CFV      Full           Yes      Yes                                 +---------+---------------+---------+-----------+----------+--------------+ SFJ      Full                                                        +---------+---------------+---------+-----------+----------+--------------+  FV Prox  Full                                                         +---------+---------------+---------+-----------+----------+--------------+ FV Mid   Full                                                        +---------+---------------+---------+-----------+----------+--------------+ FV DistalFull                                                        +---------+---------------+---------+-----------+----------+--------------+ PFV      Full                                                        +---------+---------------+---------+-----------+----------+--------------+ POP      Full           Yes      Yes                                 +---------+---------------+---------+-----------+----------+--------------+ PTV      Full                                                        +---------+---------------+---------+-----------+----------+--------------+ PERO     Full                                                        +---------+---------------+---------+-----------+----------+--------------+     Summary: BILATERAL: - No evidence of deep vein thrombosis seen in the lower extremities, bilaterally. -No evidence of popliteal cyst, bilaterally.   *See table(s) above for measurements and observations. Electronically signed by Jamelle Haring on 09/11/2020 at 37:15:22 PM.    Final      ASSESSMENT & PLAN Elizabeth Mayer 60 y.o. female with medical history significant for metastatic adenocarcinoma of the lung who presents for follow up.    After review the labs, the records, discussion with the patient the findings are most consistent with metastatic adenocarcinoma the lung without any targetable mutations.  The patient has been started on carboplatin pemetrexed pembrolizumab every 3 weeks with Dr. Julien Nordmann, however the patient has expressed a desire to transition to another provider.  As such we were happy to take up her care here.  She already discontinued the carboplatin and is on maintenance pemetrexed/pembrolizumab therapy. We assumed her  care with Cycle 6 on 04/04/2020.  Patient returns today for a follow up due  to progressive fatigue, mid back pain and episodes of dizziness/presyncope. Labs from today were reviewed without any intervention needed. UA was unremarkable for UTI. Repeat CT imaging did not show progressive disease. Brain MRI delayed as patient had trouble finding a ride.   # Metastatic Adenocarcinoma of the Lung #Brain Metastasis --Most recent scan from 09/12/2020 showed the patient is having an excellent response to treatment with Carbo/Pem/Pem chemotherapy.   --patient is requesting another brief 2 week chemotherapy holiday due to fatigue and not feeling up to treatment. Encourage her to consider pressing forward, but she notes it is taking too much out of her.  --no targetable mutations noted on prior workup. --continue to follow with Dr. Laqueta Due Onc for metastatic spread to the brain.  --can consider docetaxel/ramucirumab at time of progression.  --RTC in 2-3 weeks to resume chemotherapy.   # Fatigue: --Uncertain etiology but differentials include chemotherapy/immunotherapy side effects and progressive disease.  --Monitor closely  # Mid back pain: --Evaluate further with upcoming CT scan.  # Dizziness/Presyncope: --Evaluate further with upcoming brain MRI scheduled for 09/10/2020.   No orders of the defined types were placed in this encounter.   All questions were answered. The patient knows to call the clinic with any problems, questions or concerns.  I have spent a total of 30 minutes minutes of face-to-face and non-face-to-face time, preparing to see the patient, obtaining and/or reviewing separately obtained history, performing a medically appropriate examination, counseling and educating the patient, ordering tests, documenting clinical information in the electronic health record and care coordination.   Ledell Peoples, MD Department of Hematology/Oncology [place] [facility] Phone: [phone] Pager: [phone] Email: [email or web address]   09/21/2020 12:08 PM

## 2020-09-23 ENCOUNTER — Telehealth: Payer: Self-pay | Admitting: Hematology and Oncology

## 2020-09-23 NOTE — Telephone Encounter (Signed)
Scheduled appts per 6/18 sch msg. Pt aware.

## 2020-09-26 ENCOUNTER — Other Ambulatory Visit: Payer: BC Managed Care – PPO

## 2020-09-26 ENCOUNTER — Ambulatory Visit: Payer: BC Managed Care – PPO | Admitting: Hematology and Oncology

## 2020-09-26 ENCOUNTER — Ambulatory Visit: Payer: BC Managed Care – PPO

## 2020-10-01 ENCOUNTER — Ambulatory Visit (HOSPITAL_COMMUNITY): Payer: BC Managed Care – PPO

## 2020-10-03 ENCOUNTER — Inpatient Hospital Stay: Payer: BC Managed Care – PPO

## 2020-10-03 ENCOUNTER — Ambulatory Visit: Payer: BC Managed Care – PPO | Admitting: Hematology and Oncology

## 2020-10-03 ENCOUNTER — Other Ambulatory Visit: Payer: BC Managed Care – PPO

## 2020-10-04 ENCOUNTER — Ambulatory Visit: Payer: BC Managed Care – PPO

## 2020-10-04 ENCOUNTER — Encounter (HOSPITAL_COMMUNITY): Payer: Self-pay

## 2020-10-04 ENCOUNTER — Other Ambulatory Visit: Payer: BC Managed Care – PPO

## 2020-10-04 ENCOUNTER — Other Ambulatory Visit: Payer: Self-pay

## 2020-10-04 NOTE — Progress Notes (Signed)
Patient denies fever, cough or chest pain.   PCP - Minette Brine, FNP Cardiologist - n/a Oncology - Dr Narda Rutherford Pulmonologist- Dr. Lamonte Sakai   Chest x-ray- 08/29/20 EKG - 04/12/20 Stress Test - n/a ECHO - n/a Cardiac Cath - n/a   Anesthesia review: Yes   STOP now taking any Aspirin (unless otherwise instructed by your surgeon), Aleve, Naproxen, Ibuprofen, Motrin, Advil, Goody's, BC's, all herbal medications, fish oil, and all vitamins.

## 2020-10-08 ENCOUNTER — Other Ambulatory Visit: Payer: BC Managed Care – PPO

## 2020-10-08 ENCOUNTER — Ambulatory Visit: Payer: BC Managed Care – PPO

## 2020-10-08 ENCOUNTER — Telehealth: Payer: Self-pay | Admitting: Hematology and Oncology

## 2020-10-08 NOTE — Progress Notes (Signed)
Anesthesia Chart Review: Same day workup  Follows with hematology/oncology as well as radiation oncology for history of widely metastatic lung cancer.  Hematological/oncological history summarized per Dr. Libby Maw last note 6/16//2022, "1) 11/08/2019: establish care with Dr. Julien Nordmann and his PA Cassie Heilingoetter. Noted to have widely metastatic lung cancer with involvement of the brain, lymph nodes, and right lung. 2) 12/04/2019: palliative radiation to the right lung mass/cervical adenopathy 3) 9/7/02021: started therapy with Carbo/Pem/Pem 4) 9/29-10/09/2019: patient underwent SRS to the brain mets 5) 03/22/2020: repeat CT C/A/P and neck scheduled. Transfer care to Dr. Lorenso Courier.   6) 03/22/2020: CT C/A/P showed interval significant improvement in previously demonstrated extensive confluent lymphadenopathy in the superior mediastinum and right hilar regions. 7) 08/15/2020: Holding chemotherapy due to poor Hgb and fatigue. Allowing patient a brief 'chemo holiday' to rebound appropriately from last cycle. 8) 08/29/2020: restarted chemotherapy, Cycle 12 Day 1 09/19/2020: patient requested to HOLD Cycle 13 in setting of fatigue/weakness. "   Follows with pulmonology for chronic respiratory failure with hypoxia.  She uses 2 L supplemental oxygen continuously 24hrs per day.   CMP from 09/19/2020 reviewed, unremarkable.  CBC from 09/19/2020 reviewed, anemia with hemoglobin 9.5, appears near recent baseline.   EKG 04/12/20: Sinus rhythm. Rate 86. Borderline right axis deviation. Low voltage, precordial leads. Anteroseptal infarct, age indeterminate. chronic T wave flattening   CHEST - 2 VIEW 05/10/20: COMPARISON:  04/12/2020   FINDINGS: right hilar masslike opacity is again noted. Previously seen peripheral opacities in the right lung as well as left base have improved since prior study. Heart is normal size. Right Port-A-Cath remains in place, unchanged. No effusions or pneumothorax.   IMPRESSION: Right hilar mass  again noted, unchanged.   Previously seen peripheral opacities have improved since prior study.   CT chest abdomen pelvis 03/22/2020: IMPRESSION: 1. Interval significant improvement in previously demonstrated extensive confluent lymphadenopathy in the superior mediastinum and right hilar regions. 2. The right infrahilar mass is not easily distinguished from adjacent atelectasis within the right middle and lower lobes, although appears smaller. 3. Small bilateral pleural effusions and left basilar atelectasis. 4. No evidence of metastatic disease within the abdomen. 5. Stable small left adrenal nodule, likely an adenoma based on stability. 6. Aortic Atherosclerosis (ICD10-I70.0) and Emphysema (ICD10-J43.9).   [facility]/Anesthesiology Phone [phone] 10/08/2020 10:22 AM

## 2020-10-08 NOTE — Anesthesia Preprocedure Evaluation (Addendum)
Anesthesia Evaluation  Patient identified by MRN, date of birth, ID band Patient awake    Reviewed: Allergy & Precautions, NPO status , Patient's Chart, lab work & pertinent test results  History of Anesthesia Complications (+) history of anesthetic complications  Airway Mallampati: II  TM Distance: >3 FB Neck ROM: Full    Dental  (+) Dental Advisory Given, Teeth Intact   Pulmonary shortness of breath, asthma , COPD, former smoker,    Pulmonary exam normal breath sounds clear to auscultation       Cardiovascular + angina Normal cardiovascular exam+ Valvular Problems/Murmurs  Rhythm:Regular Rate:Normal     Neuro/Psych  Headaches,  Neuromuscular disease    GI/Hepatic Neg liver ROS, GERD  ,  Endo/Other  negative endocrine ROS  Renal/GU negative Renal ROS     Musculoskeletal  (+) Arthritis ,   Abdominal   Peds  Hematology  (+) Blood dyscrasia, anemia ,   Anesthesia Other Findings   Reproductive/Obstetrics                                                           Anesthesia Evaluation  Patient identified by MRN, date of birth, ID band Patient awake    Reviewed: Allergy & Precautions, NPO status , Patient's Chart, lab work & pertinent test results  History of Anesthesia Complications (+) history of anesthetic complications  Airway Mallampati: II  TM Distance: >3 FB Neck ROM: Full    Dental no notable dental hx. (+) Teeth Intact   Pulmonary shortness of breath, with exertion, at rest and Long-Term Oxygen Therapy, asthma , pneumonia, resolved, COPD,  COPD inhaler and oxygen dependent, former smoker,  Non small cell Ca right lung- undergoing ChemoRx+ RT   Pulmonary exam normal breath sounds clear to auscultation + decreased breath sounds      Cardiovascular + angina Normal cardiovascular exam+ Valvular Problems/Murmurs  Rhythm:Regular Rate:Normal     Neuro/Psych   Headaches, Metastatic brain disease-undergoing ChemoRx+ RT  Neuromuscular disease    GI/Hepatic Neg liver ROS, GERD  Medicated and Controlled,  Endo/Other  negative endocrine ROS  Renal/GU negative Renal ROS  negative genitourinary   Musculoskeletal  (+) Arthritis , Osteoarthritis,    Abdominal   Peds  Hematology  (+) anemia ,   Anesthesia Other Findings   Reproductive/Obstetrics                            Anesthesia Physical Anesthesia Plan  ASA: III  Anesthesia Plan: General   Post-op Pain Management:    Induction: Intravenous  PONV Risk Score and Plan: 3 and Treatment may vary due to age or medical condition and Ondansetron  Airway Management Planned: LMA  Additional Equipment:   Intra-op Plan:   Post-operative Plan:   Informed Consent: I have reviewed the patients History and Physical, chart, labs and discussed the procedure including the risks, benefits and alternatives for the proposed anesthesia with the patient or authorized representative who has indicated his/her understanding and acceptance.     Dental advisory given  Plan Discussed with: Anesthesiologist and CRNA  Anesthesia Plan Comments: (PAT note by Karoline Caldwell, PA-C: Follows with hematology/oncology as well as radiation oncology for history of widely metastatic lung cancer.  Hematological/oncological history summarized per Dr. Libby Maw last note 05/22/2020, "  1) 11/08/2019: establish care with Dr. Julien Nordmann and his PA [facility]. Noted to have widely metastatic lung cancer with involvement of the brain, lymph nodes, and right lung.  2) 12/04/2019: palliative radiation to the right lung mass/cervical adenopathy 3) 9/7/02021: started therapy with Carbo/Pem/Pem 4) 9/29-10/09/2019: patient underwent SRS to the brain mets 5) 03/22/2020: repeat CT C/A/P and neck scheduled. Transfer care to Dr. Lorenso Courier.  6) 03/22/2020: CT C/A/P showed interval significant improvement in previously  demonstrated extensive confluent lymphadenopathy in the superior mediastinum and right hilar regions."  Follows with pulmonology for chronic respiratory failure with hypoxia.  She uses 2 L supplemental oxygen continuously 24hrs per day.  Will need DOS labs and eval.  EKG 04/12/20: Sinus rhythm. Rate 86. Borderline right axis deviation. Low voltage, precordial leads. Anteroseptal infarct, age indeterminate. chronic T wave flattening  CHEST - 2 VIEW 05/10/20: COMPARISON:  04/12/2020  FINDINGS: right hilar masslike opacity is again noted. Previously seen peripheral opacities in the right lung as well as left base have improved since prior study. Heart is normal size. Right Port-A-Cath remains in place, unchanged. No effusions or pneumothorax.  IMPRESSION: Right hilar mass again noted, unchanged.  Previously seen peripheral opacities have improved since prior study.  CT chest abdomen pelvis 03/22/2020: IMPRESSION: 1. Interval significant improvement in previously demonstrated extensive confluent lymphadenopathy in the superior mediastinum and right hilar regions. 2. The right infrahilar mass is not easily distinguished from adjacent atelectasis within the right middle and lower lobes, although appears smaller. 3. Small bilateral pleural effusions and left basilar atelectasis. 4. No evidence of metastatic disease within the abdomen. 5. Stable small left adrenal nodule, likely an adenoma based on stability. 6. Aortic Atherosclerosis (ICD10-I70.0) and Emphysema (ICD10-J43.9).  )       Anesthesia Quick Evaluation  Anesthesia Physical Anesthesia Plan  ASA: 3  Anesthesia Plan: General   Post-op Pain Management:    Induction: Intravenous  PONV Risk Score and Plan: 3 and Ondansetron, Dexamethasone and Treatment may vary due to age or medical condition  Airway Management Planned: LMA  Additional Equipment:   Intra-op Plan:   Post-operative Plan: Extubation in  OR  Informed Consent: I have reviewed the patients History and Physical, chart, labs and discussed the procedure including the risks, benefits and alternatives for the proposed anesthesia with the patient or authorized representative who has indicated his/her understanding and acceptance.     Dental advisory given  Plan Discussed with:   Anesthesia Plan Comments: (PAT note by Karoline Caldwell, PA-C: Follows with hematology/oncology as well as radiation oncology for history of widely metastatic lung cancer. Hematological/oncological history summarized per Dr. Libby Maw last note 6/16//2022, "1) 11/08/2019: establish care with Dr. Julien Nordmann and his PA Cassie Heilingoetter. Noted to have widely metastatic lung cancer with involvement of the brain, lymph nodes, and right lung. 2) 12/04/2019: palliative radiation to the right lung mass/cervical adenopathy 3) 9/7/02021: started therapy with Carbo/Pem/Pem 4) 9/29-10/09/2019: patient underwent SRS to the brain mets 5) 03/22/2020: repeat CT C/A/P and neck scheduled. Transfer care to Dr. Lorenso Courier.  6) 03/22/2020: CT C/A/P showed interval significant improvement in previously demonstrated extensive confluent lymphadenopathy in the superior mediastinum and right hilar regions. 7) 08/15/2020: Holding chemotherapy due to poor Hgb and fatigue. Allowing patient a brief 'chemo holiday' to rebound appropriately from last cycle. 8) 08/29/2020: restarted chemotherapy, Cycle 12 Day 1 09/19/2020: patient requested to HOLD Cycle 13 in setting of fatigue/weakness."  Follows with pulmonology for chronic respiratory failure with hypoxia. She uses 2 L supplemental  oxygen continuously 24hrs per day.  CMP from 09/19/2020 reviewed, unremarkable.  CBC from 09/19/2020 reviewed, anemia with hemoglobin 9.5, appears near recent baseline.  EKG 04/12/20:Sinus rhythm. Rate 86.Borderline right axis deviation.Low voltage, precordial leads.Anteroseptal infarct, age indeterminate.chronic T wave  flattening  CHEST - 2 VIEW2/4/22: COMPARISON: 04/12/2020  FINDINGS: right hilar masslike opacity is again noted. Previously seen peripheral opacities in the right lung as well as left base have improved since prior study. Heart is normal size. Right Port-A-Cath remains in place, unchanged. No effusions or pneumothorax.  IMPRESSION: Right hilar mass again noted, unchanged.  Previously seen peripheral opacities have improved since prior study.  CT chest abdomen pelvis 03/22/2020: IMPRESSION: 1. Interval significant improvement in previously demonstrated extensive confluent lymphadenopathy in the superior mediastinum and right hilar regions. 2. The right infrahilar mass is not easily distinguished from adjacent atelectasis within the right middle and lower lobes, although appears smaller. 3. Small bilateral pleural effusions and left basilar atelectasis. 4. No evidence of metastatic disease within the abdomen. 5. Stable small left adrenal nodule, likely an adenoma based on stability. 6. Aortic Atherosclerosis (ICD10-I70.0) and Emphysema (ICD10-J43.9).  )      Anesthesia Quick Evaluation

## 2020-10-08 NOTE — Telephone Encounter (Signed)
Sch per 06.30 shc msg,pt aware

## 2020-10-09 ENCOUNTER — Telehealth: Payer: Self-pay | Admitting: *Deleted

## 2020-10-09 ENCOUNTER — Ambulatory Visit: Payer: BC Managed Care – PPO | Admitting: Hematology and Oncology

## 2020-10-09 ENCOUNTER — Other Ambulatory Visit: Payer: BC Managed Care – PPO

## 2020-10-09 NOTE — Telephone Encounter (Signed)
TCT patient and reminded of her appts for this week.  Including her brain MRI with anesthesia , her lab and MD appt on Friday and her infusion appt on Friday at the [place] office. Pt voiced understanding and stated she had transportation for these appts.

## 2020-10-10 ENCOUNTER — Ambulatory Visit (HOSPITAL_COMMUNITY)
Admission: RE | Admit: 2020-10-10 | Discharge: 2020-10-10 | Disposition: A | Discharge status: Home / Self Care | Payer: Self-pay | Attending: Hematology and Oncology | Admitting: Hematology and Oncology

## 2020-10-10 ENCOUNTER — Inpatient Hospital Stay: Payer: Self-pay

## 2020-10-10 ENCOUNTER — Other Ambulatory Visit: Payer: Self-pay

## 2020-10-10 ENCOUNTER — Ambulatory Visit (HOSPITAL_COMMUNITY)
Admission: RE | Admit: 2020-10-10 | Discharge: 2020-10-10 | Disposition: A | Discharge status: Home / Self Care | Payer: BC Managed Care – PPO | Source: Ambulatory Visit | Attending: Internal Medicine | Admitting: Internal Medicine

## 2020-10-10 ENCOUNTER — Ambulatory Visit (HOSPITAL_COMMUNITY): Payer: BC Managed Care – PPO

## 2020-10-10 ENCOUNTER — Encounter (HOSPITAL_COMMUNITY)
Admission: RE | Disposition: A | Discharge status: Home / Self Care | Payer: Self-pay | Source: Home / Self Care | Attending: Hematology and Oncology

## 2020-10-10 ENCOUNTER — Ambulatory Visit (HOSPITAL_COMMUNITY): Payer: BC Managed Care – PPO | Admitting: Physician Assistant

## 2020-10-10 ENCOUNTER — Encounter (HOSPITAL_COMMUNITY): Payer: Self-pay | Admitting: Hematology and Oncology

## 2020-10-10 ENCOUNTER — Encounter (HOSPITAL_COMMUNITY): Payer: Self-pay

## 2020-10-10 DIAGNOSIS — C7931 Secondary malignant neoplasm of brain: Secondary | ICD-10-CM

## 2020-10-10 DIAGNOSIS — C3490 Malignant neoplasm of unspecified part of unspecified bronchus or lung: Secondary | ICD-10-CM | POA: Insufficient documentation

## 2020-10-10 HISTORY — PX: RADIOLOGY WITH ANESTHESIA: SHX6223

## 2020-10-10 SURGERY — MRI WITH ANESTHESIA
Anesthesia: General

## 2020-10-10 MED ORDER — GADOBUTROL 1 MMOL/ML IV SOLN
6.0000 mL | Freq: Once | INTRAVENOUS | Status: AC | PRN
  Administered 2020-10-10: 6 mL via INTRAVENOUS

## 2020-10-10 MED ORDER — LIDOCAINE 2% (20 MG/ML) 5 ML SYRINGE
INTRAMUSCULAR | Status: DC | PRN
  Administered 2020-10-10: 60 mg via INTRAVENOUS

## 2020-10-10 MED ORDER — PROMETHAZINE HCL 25 MG/ML IJ SOLN
6.2500 mg | INTRAMUSCULAR | Status: DC | PRN
Start: 2020-10-10 — End: 2020-10-10

## 2020-10-10 MED ORDER — MEPERIDINE HCL 25 MG/ML IJ SOLN: 6.2500 mg | INTRAMUSCULAR | Status: DC | PRN

## 2020-10-10 MED ORDER — ONDANSETRON HCL 4 MG/2ML IJ SOLN
INTRAMUSCULAR | Status: DC | PRN
  Administered 2020-10-10: 4 mg via INTRAVENOUS

## 2020-10-10 MED ORDER — FENTANYL CITRATE (PF) 100 MCG/2ML IJ SOLN: 25.0000 ug | INTRAMUSCULAR | Status: DC | PRN

## 2020-10-10 MED ORDER — PHENYLEPHRINE HCL (PRESSORS) 10 MG/ML IV SOLN
INTRAVENOUS | Status: DC | PRN
  Administered 2020-10-10: 160 ug via INTRAVENOUS

## 2020-10-10 MED ORDER — PROPOFOL 10 MG/ML IV BOLUS
INTRAVENOUS | Status: DC | PRN
  Administered 2020-10-10: 120 mg via INTRAVENOUS
  Administered 2020-10-10: 60 mg via INTRAVENOUS

## 2020-10-10 MED ORDER — ORAL CARE MOUTH RINSE: 15.0000 mL | Freq: Once | OROMUCOSAL | Status: AC

## 2020-10-10 MED ORDER — CHLORHEXIDINE GLUCONATE 0.12 % MT SOLN
15.0000 mL | Freq: Once | OROMUCOSAL | Status: AC
  Administered 2020-10-10: 15 mL via OROMUCOSAL
  Filled 2020-10-10: qty 15

## 2020-10-10 MED ORDER — LACTATED RINGERS IV SOLN: INTRAVENOUS | Status: DC

## 2020-10-10 MED ORDER — DEXAMETHASONE SODIUM PHOSPHATE 4 MG/ML IJ SOLN
INTRAMUSCULAR | Status: DC | PRN
  Administered 2020-10-10: 5 mg via INTRAVENOUS

## 2020-10-10 NOTE — Transfer of Care (Signed)
Immediate Anesthesia Transfer of Care Note  Patient: Elizabeth Mayer  Procedure(s) Performed: MRI WITH ANESTHESIA BRAIN WITH AND WITHOUT CONTRAST  Patient Location: PACU  Anesthesia Type:General  Level of Consciousness: awake, alert , oriented and patient cooperative  Airway & Oxygen Therapy: Patient Spontanous Breathing and Patient connected to nasal cannula oxygen  Post-op Assessment: Report given to RN and Post -op Vital signs reviewed and stable  Post vital signs: Reviewed and stable  Last Vitals:  Vitals Value Taken Time  BP 172/90 10/10/20 1130  Temp 36.6 C 10/10/20 1130  Pulse 66 10/10/20 1142  Resp 23 10/10/20 1142  SpO2 96 % 10/10/20 1142  Vitals shown include unvalidated device data.  Last Pain:  Vitals:   10/10/20 1130  TempSrc:   PainSc: 0-No pain      Patients Stated Pain Goal: 3 (38/88/75 [id number])  Complications: No notable events documented.

## 2020-10-10 NOTE — Anesthesia Procedure Notes (Signed)
Procedure Name: LMA Insertion Date/Time: 10/10/2020 10:27 AM Performed by: Trinna Post., CRNA Pre-anesthesia Checklist: Patient identified, Emergency Drugs available, Suction available, Timeout performed and Patient being monitored Patient Re-evaluated:Patient Re-evaluated prior to induction Oxygen Delivery Method: Circle system utilized Preoxygenation: Pre-oxygenation with 100% oxygen Induction Type: IV induction LMA: LMA inserted LMA Size: 4.0 Placement Confirmation: positive ETCO2

## 2020-10-10 NOTE — H&P (Signed)
Anesthesia H&P Update: History and Physical Exam reviewed; patient is OK for planned anesthetic and procedure. ? ?

## 2020-10-10 NOTE — Anesthesia Postprocedure Evaluation (Signed)
Anesthesia Post Note  Patient: Elizabeth Mayer  Procedure(s) Performed: MRI WITH ANESTHESIA BRAIN WITH AND WITHOUT CONTRAST     Patient location during evaluation: PACU Anesthesia Type: General Level of consciousness: sedated and patient cooperative Pain management: pain level controlled Vital Signs Assessment: post-procedure vital signs reviewed and stable Respiratory status: spontaneous breathing Cardiovascular status: stable Anesthetic complications: no   No notable events documented.  Last Vitals:  Vitals:   10/10/20 1145 10/10/20 1200  BP: (!) 178/85 (!) 187/88  Pulse: 68 70  Resp: 17 20  Temp:  36.5 C  SpO2: 98% 97%    Last Pain:  Vitals:   10/10/20 1200  TempSrc:   PainSc: 0-No pain                 Nolon Nations

## 2020-10-11 ENCOUNTER — Inpatient Hospital Stay: Payer: Self-pay

## 2020-10-11 ENCOUNTER — Other Ambulatory Visit: Payer: BC Managed Care – PPO

## 2020-10-11 ENCOUNTER — Encounter (HOSPITAL_COMMUNITY): Payer: Self-pay | Admitting: Radiology

## 2020-10-11 ENCOUNTER — Inpatient Hospital Stay (HOSPITAL_BASED_OUTPATIENT_CLINIC_OR_DEPARTMENT_OTHER): Payer: Self-pay | Admitting: Hematology and Oncology

## 2020-10-11 ENCOUNTER — Inpatient Hospital Stay: Payer: Self-pay | Attending: Internal Medicine

## 2020-10-11 VITALS — BP 163/77 | HR 82 | Temp 97.2°F | Resp 20 | Ht 64.4 in | Wt 147.1 lb

## 2020-10-11 DIAGNOSIS — C3491 Malignant neoplasm of unspecified part of right bronchus or lung: Secondary | ICD-10-CM

## 2020-10-11 DIAGNOSIS — J45909 Unspecified asthma, uncomplicated: Secondary | ICD-10-CM | POA: Insufficient documentation

## 2020-10-11 DIAGNOSIS — Z95828 Presence of other vascular implants and grafts: Secondary | ICD-10-CM

## 2020-10-11 DIAGNOSIS — Z5111 Encounter for antineoplastic chemotherapy: Secondary | ICD-10-CM | POA: Insufficient documentation

## 2020-10-11 DIAGNOSIS — Z5112 Encounter for antineoplastic immunotherapy: Secondary | ICD-10-CM

## 2020-10-11 DIAGNOSIS — C7931 Secondary malignant neoplasm of brain: Secondary | ICD-10-CM | POA: Insufficient documentation

## 2020-10-11 DIAGNOSIS — Z79899 Other long term (current) drug therapy: Secondary | ICD-10-CM | POA: Insufficient documentation

## 2020-10-11 DIAGNOSIS — I252 Old myocardial infarction: Secondary | ICD-10-CM | POA: Insufficient documentation

## 2020-10-11 DIAGNOSIS — Z7951 Long term (current) use of inhaled steroids: Secondary | ICD-10-CM | POA: Insufficient documentation

## 2020-10-11 LAB — CBC WITH DIFFERENTIAL (CANCER CENTER ONLY)
Abs Immature Granulocytes: 0.01 10*3/uL (ref 0.00–0.07)
Basophils Absolute: 0 10*3/uL (ref 0.0–0.1)
Basophils Relative: 1 %
Eosinophils Absolute: 0.1 10*3/uL (ref 0.0–0.5)
Eosinophils Relative: 2 %
HCT: 30.2 % — ABNORMAL LOW (ref 36.0–46.0)
Hemoglobin: 10 g/dL — ABNORMAL LOW (ref 12.0–15.0)
Immature Granulocytes: 0 %
Lymphocytes Relative: 25 %
Lymphs Abs: 1.2 10*3/uL (ref 0.7–4.0)
MCH: 31 pg (ref 26.0–34.0)
MCHC: 33.1 g/dL (ref 30.0–36.0)
MCV: 93.5 fL (ref 80.0–100.0)
Monocytes Absolute: 0.5 10*3/uL (ref 0.1–1.0)
Monocytes Relative: 9 %
Neutro Abs: 3.1 10*3/uL (ref 1.7–7.7)
Neutrophils Relative %: 63 %
Platelet Count: 142 10*3/uL — ABNORMAL LOW (ref 150–400)
RBC: 3.23 MIL/uL — ABNORMAL LOW (ref 3.87–5.11)
RDW: 13.1 % (ref 11.5–15.5)
WBC Count: 4.9 10*3/uL (ref 4.0–10.5)
nRBC: 0 % (ref 0.0–0.2)

## 2020-10-11 LAB — CMP (CANCER CENTER ONLY)
ALT: 11 U/L (ref 0–44)
AST: 21 U/L (ref 15–41)
Albumin: 3.4 g/dL — ABNORMAL LOW (ref 3.5–5.0)
Alkaline Phosphatase: 49 U/L (ref 38–126)
Anion gap: 11 (ref 5–15)
BUN: 12 mg/dL (ref 8–23)
CO2: 30 mmol/L (ref 22–32)
Calcium: 9.4 mg/dL (ref 8.9–10.3)
Chloride: 104 mmol/L (ref 98–111)
Creatinine: 1.08 mg/dL — ABNORMAL HIGH (ref 0.44–1.00)
GFR, Estimated: 58 mL/min — ABNORMAL LOW (ref >60)
Glucose, Bld: 96 mg/dL (ref 70–99)
Potassium: 3.3 mmol/L — ABNORMAL LOW (ref 3.5–5.1)
Sodium: 145 mmol/L (ref 135–145)
Total Bilirubin: 0.3 mg/dL (ref 0.3–1.2)
Total Protein: 6.7 g/dL (ref 6.5–8.1)

## 2020-10-11 LAB — TSH: TSH: 0.746 u[IU]/mL (ref 0.308–3.960)

## 2020-10-11 LAB — MAGNESIUM: Magnesium: 1.6 mg/dL — ABNORMAL LOW (ref 1.7–2.4)

## 2020-10-11 MED ORDER — PROCHLORPERAZINE MALEATE 10 MG PO TABS
10.0000 mg | ORAL_TABLET | Freq: Once | ORAL | Status: AC
  Administered 2020-10-11: 10 mg via ORAL
  Filled 2020-10-11: qty 1

## 2020-10-11 MED ORDER — HEPARIN SOD (PORK) LOCK FLUSH 100 UNIT/ML IV SOLN
500.0000 [IU] | Freq: Once | INTRAVENOUS | Status: DC | PRN
  Filled 2020-10-11: qty 5

## 2020-10-11 MED ORDER — SODIUM CHLORIDE 0.9% FLUSH
10.0000 mL | Freq: Once | INTRAVENOUS | Status: AC
  Administered 2020-10-11: 10 mL
  Filled 2020-10-11: qty 10

## 2020-10-11 MED ORDER — SODIUM CHLORIDE 0.9 % IV SOLN
Freq: Once | INTRAVENOUS | Status: AC
Start: 2020-10-11 — End: 2020-10-11
  Filled 2020-10-11: qty 250

## 2020-10-11 MED ORDER — SODIUM CHLORIDE 0.9 % IV SOLN
200.0000 mg | Freq: Once | INTRAVENOUS | Status: AC
  Administered 2020-10-11: 200 mg via INTRAVENOUS
  Filled 2020-10-11: qty 8

## 2020-10-11 MED ORDER — HEPARIN SOD (PORK) LOCK FLUSH 100 UNIT/ML IV SOLN
500.0000 [IU] | Freq: Once | INTRAVENOUS | Status: DC
  Filled 2020-10-11: qty 5

## 2020-10-11 MED ORDER — SODIUM CHLORIDE 0.9 % IV SOLN
500.0000 mg/m2 | Freq: Once | INTRAVENOUS | Status: AC
  Administered 2020-10-11: 900 mg via INTRAVENOUS
  Filled 2020-10-11: qty 16

## 2020-10-11 NOTE — Progress Notes (Signed)
[place] [phone]   [phone]  PROGRESS NOTE  Patient Care Team: Minette Brine, FNP as PCP - General (General Practice)  Hematological/Oncological History # Metastatic Adenocarcinoma of the Lung #Brain Metastasis 1) 11/08/2019: establish care with Dr. Julien Nordmann and his PA Cassie Heilingoetter. Noted to have widely metastatic lung cancer with involvement of the brain, lymph nodes, and right lung.  2) 12/04/2019: palliative radiation to the right lung mass/cervical adenopathy 3) 9/7/02021: started therapy with Carbo/Pem/Pem 4) 9/29-10/09/2019: patient underwent SRS to the brain mets 5) 03/22/2020: repeat CT C/A/P and neck scheduled. Transfer care to Dr. Lorenso Courier.   6) 03/22/2020: CT C/A/P showed interval significant improvement in previously demonstrated extensive confluent lymphadenopathy in the superior mediastinum and right hilar regions. 7) 08/15/2020: Holding chemotherapy due to poor Hgb and fatigue. Allowing patient a brief 'chemo holiday' to rebound appropriately from last cycle.  8) 08/29/2020: restarted chemotherapy, Cycle 12 Day 1 9) 09/19/2020: patient requested to HOLD Cycle 13 in setting of fatigue/weakness.  10) 10/11/2020: restart Cycle 13 Day 1 of maintenance Pem/Pem  Interval History:  Elizabeth Mayer 60 y.o. female with medical history significant for metastatic adenocarcinoma of the lung who presents for follow up. The patient's last visit was on 09/19/2020.   On exam today Elizabeth Mayer reports her energy is much improved from her last visit.  She notes that she is "not feeling as bad as I had been".  Her appetite still has not come back and her intake is poor.  She notes that she does occasionally have some headaches, particularly when she lays down.  She notes that she is trying to moderate her use of pain medications.  She does have occasional episodes of heart palpitations.  She notes that she does have dizzy spells but they are not nearly as  bad as they once were.  She is on 3 L of oxygen and notes that this level has been stable.  She denies any fevers, chills, night sweats, chest pain or cough. She has no other complaints.  A full 10 point ROS is listed below.  MEDICAL HISTORY:  Past Medical History:  Diagnosis Date   Anemia    Angina 1982   related to stress   Asthma    in the past,  no current problems   Complication of anesthesia    states anesthesia made her hair fall out   Constipation    Dyspnea    on oxygen at home - 2L via [place]   Fatigue    GERD (gastroesophageal reflux disease)    patient denies this dx   Headache    Heart murmur 1970s   no problems currently   Ingrown toenail    Lung cancer ([place]) 10/2019   metastatic disease to the brain   On home oxygen therapy    2.2 lpm, 24 hours a day   Past heart attack 1980-1981   pt states she passed out and woke up in hospital- told she had heart attack, but then dr said he couldn't find anything wrong.   Pneumonia    x 1    SURGICAL HISTORY: Past Surgical History:  Procedure Laterality Date   CERVICAL DISC SURGERY  2000   Disc removed from neck    ingrown toe nail surgery Bilateral    IR IMAGING GUIDED PORT INSERTION  02/16/2020   MULTIPLE TOOTH EXTRACTIONS     for braces   RADIOLOGY WITH ANESTHESIA N/A 12/05/2019   Procedure: MRI BRAIN WITH  AND WITHOUT CONTRAST;  Surgeon: Radiologist, Medication, MD;  Location: [facility];  Service: Radiology;  Laterality: N/A;   RADIOLOGY WITH ANESTHESIA N/A 01/02/2020   Procedure: MRI BRAIN WITH AND WITHOUT CONTRAST;  Surgeon: Radiologist, Medication, MD;  Location: [facility];  Service: Radiology;  Laterality: N/A;   RADIOLOGY WITH ANESTHESIA N/A 02/20/2020   Procedure: MRI WITH ANESTHESIA OF BRAIN WITH AND WITHOUT CONTRAST;  Surgeon: Radiologist, Medication, MD;  Location: [facility];  Service: Radiology;  Laterality: N/A;   RADIOLOGY WITH ANESTHESIA N/A 06/11/2020   Procedure: MRI WITH ANESTHESIA OF BRAIN WITH AND WITHOUT CONTRAST;   Surgeon: Radiologist, Medication, MD;  Location: [facility] [place];  Service: Radiology;  Laterality: N/A;   RADIOLOGY WITH ANESTHESIA N/A 10/10/2020   Procedure: MRI WITH ANESTHESIA BRAIN WITH AND WITHOUT CONTRAST;  Surgeon: Radiologist, Medication, MD;  Location: [place] [facility];  Service: Radiology;  Laterality: N/A;   TONSILLECTOMY      SOCIAL HISTORY: Social History   Socioeconomic History   Marital status: Legally Separated    Spouse name: Not on file   Number of children: 0   Years of education: Not on file   Highest education level: Not on file  Occupational History   Occupation: Secondary school teacher  Tobacco Use   Smoking status: Former    Packs/day: 1.00    Years: 20.00    Pack years: 20.00    Types: Cigarettes    Start date: 13    Quit date: 2001    Years since quitting: 21.5   Smokeless tobacco: Never  Vaping Use   Vaping Use: Never used  Substance and Sexual Activity   Alcohol use: No   Drug use: No   Sexual activity: Yes    Birth control/protection: None, Post-menopausal  Other Topics Concern   Not on file  Social History Narrative   Not on file   Social Determinants of Health   Financial Resource Strain: High Risk   Difficulty of Paying Living Expenses: Hard  Food Insecurity: No Food Insecurity   Worried About Running Out of Food in the Last Year: Never true   Ran Out of Food in the Last Year: Never true  Transportation Needs: No Transportation Needs   Lack of Transportation (Medical): No   Lack of Transportation (Non-Medical): No  Physical Activity: Inactive   Days of Exercise per Week: 0 days   Minutes of Exercise per Session: 0 min  Stress: Not on file  Social Connections: Moderately Isolated   Frequency of Communication with Friends and Family: Twice a week   Frequency of Social Gatherings with Friends and Family: Twice a week   Attends Religious Services: More than 4 times per year   Active Member of [employer] or Organizations: No   Attends Arts administrator: Not on file   Marital Status: Separated  Intimate Partner Violence: Not on file    FAMILY HISTORY: Family History  Problem Relation Age of Onset   Arthritis Mother    Heart failure Father     ALLERGIES:  is allergic to pseudoephedrine, gabapentin, mobic [meloxicam], and penicillins.  MEDICATIONS:  Current Outpatient Medications  Medication Sig Dispense Refill   acetaminophen (TYLENOL) 500 MG tablet Take 1,000 mg by mouth every 6 (six) hours as needed for moderate pain.      albuterol (VENTOLIN HFA) 108 (90 Base) MCG/ACT inhaler Inhale 2 puffs into the lungs every 6 (six) hours as needed for wheezing or shortness of breath. (Patient taking differently: Inhale 2 puffs into the  lungs 2 (two) times daily. Additional 2 puff's of needed) 18 g 6   benzoyl peroxide (CERAVE ACNE FOAMING CREAM) 4 % external liquid Apply 1 application topically daily. Face     budesonide-formoterol (SYMBICORT) 80-4.5 MCG/ACT inhaler Inhale 2 puffs into the lungs in the morning and at bedtime. 1 each 11   camphor-menthol (SARNA) lotion Apply 1 application topically daily as needed for itching.     Carboxymethylcellulose Sodium (REFRESH CELLUVISC OP) Place 2 drops into both eyes See admin instructions. Instill 2  drop into both eyes twice daily (scheduled) & then use as needed throughout the day for dry/irritated eyes     clindamycin (CLEOCIN T) 1 % lotion Apply 1 application topically daily.     Cyanocobalamin (B-12 COMPLIANCE INJECTION) 1000 MCG/ML KIT Inject 1,000 mcg as directed See admin instructions. As needed give at Chemo     fexofenadine (ALLEGRA) 180 MG tablet Take 180 mg by mouth in the morning.     fluticasone (FLONASE) 50 MCG/ACT nasal spray Place 1 spray into both nostrils daily.     folic acid (FOLVITE) 1 MG tablet Take 1 tablet (1 mg total) by mouth daily. (Patient taking differently: Take 1 mg by mouth in the morning.) 90 tablet 2   guaiFENesin (MUCINEX) 600 MG 12 hr tablet Take 600 mg by  mouth 2 (two) times daily.      HYDROcodone-acetaminophen (HYCET) 7.5-325 mg/15 ml solution Take 10 mLs by mouth 2 (two) times daily. For pain from cancer and radiation reactions (Patient taking differently: Take 10 mLs by mouth 2 (two) times daily as needed for moderate pain or severe pain. For pain from cancer and radiation reactions) 473 mL 0   lidocaine (XYLOCAINE) 2 % solution Use as directed 15 mLs in the mouth or throat as needed for mouth pain. Swallow 30 min prior to meals and at bedtime for throat/esophagus pain 250 mL 1   lidocaine-prilocaine (EMLA) cream Apply 1 application topically as needed. (Patient taking differently: Apply 1 application topically as needed (port access).) 30 g 2   LORazepam (ATIVAN) 0.5 MG tablet Please take 1 tablet 30 minutes before your scan as needed for anxiety (Patient taking differently: Take 0.5 mg by mouth See admin instructions. Please take 1 tablet 30 minutes before your scan as needed for anxiety) 2 tablet 0   Multiple Vitamin (MULTIVITAMIN WITH MINERALS) TABS tablet Take 1 tablet by mouth daily. Alive     OXYGEN Inhale 3 L/min into the lungs continuous.     potassium chloride 20 MEQ/15ML (10%) SOLN Take 15 mLs (20 mEq total) by mouth 2 (two) times daily. 210 mL 0   prochlorperazine (COMPAZINE) 10 MG tablet Take 1 tablet (10 mg total) by mouth every 6 (six) hours as needed. (Patient taking differently: Take 10 mg by mouth every 6 (six) hours as needed (nausea with chemo).) 30 tablet 2   Soap & Cleansers (MEDERMA AG FACIAL TONER) LIQD Apply 1 application topically daily.     triamcinolone ointment (KENALOG) 0.1 % Apply 1 application topically in the morning and at bedtime.     No current facility-administered medications for this visit.   Facility-Administered Medications Ordered in Other Visits  Medication Dose Route Frequency Provider Last Rate Last Admin   heparin lock flush 100 unit/mL  500 Units Intracatheter Once PRN Orson Slick, MD         REVIEW OF SYSTEMS:   Constitutional: ( - ) fevers, ( - )  chills , ( - )  night sweats Eyes: ( - ) blurriness of vision, ( - ) double vision, ( - ) watery eyes Ears, nose, mouth, throat, and face: ( - ) mucositis, ( - ) sore throat Respiratory: ( - ) cough, ( - ) dyspnea, ( - ) wheezes Cardiovascular: ( - ) palpitation, ( - ) chest discomfort, ( - ) lower extremity swelling Gastrointestinal:  ( - ) nausea, ( - ) heartburn, ( - ) change in bowel habits Skin: ( - ) abnormal skin rashes Lymphatics: ( - ) new lymphadenopathy, ( - ) easy bruising Neurological: ( - ) numbness, ( - ) tingling, ( - ) new weaknesses Behavioral/Psych: ( - ) mood change, ( - ) new changes  All other systems were reviewed with the patient and are negative.  PHYSICAL EXAMINATION: ECOG PERFORMANCE STATUS: 3 - Symptomatic, >50% confined to bed  Vitals:   10/11/20 1054  BP: (!) 163/77  Pulse: 82  Resp: 20  Temp: (!) 97.2 F (36.2 C)  SpO2: 100%   Filed Weights   10/11/20 1054  Weight: 147 lb 1.6 oz (66.7 kg)    GENERAL: well appearing middle aged [place] American female. alert, no distress and comfortable SKIN: skin color, texture, turgor are normal, no rashes or significant lesions EYES: conjunctiva are pink and non-injected, sclera clear LUNGS: clear to auscultation and percussion with normal breathing effort HEART: regular rate & rhythm and no murmurs and no lower extremity edema Musculoskeletal: no cyanosis of digits and no clubbing.  PSYCH: alert & oriented x 3, fluent speech NEURO: no focal motor/sensory deficits   LABORATORY DATA:  I have reviewed the data as listed CBC Latest Ref Rng & Units 10/11/2020 09/19/2020 09/04/2020  WBC 4.0 - 10.5 K/uL 4.9 3.6(L) 3.4(L)  Hemoglobin 12.0 - 15.0 g/dL 10.0(L) 9.5(L) 9.9(L)  Hematocrit 36.0 - 46.0 % 30.2(L) 29.1(L) 31.5(L)  Platelets 150 - 400 K/uL 142(L) 155 138(L)    CMP Latest Ref Rng & Units 10/11/2020 09/19/2020 09/04/2020  Glucose 70 - 99 mg/dL 96  130(H) 72  BUN 8 - 23 mg/dL 12 7(L) 14  Creatinine 0.44 - 1.00 mg/dL 1.08(H) 1.04(H) 1.04(H)  Sodium 135 - 145 mmol/L 145 143 141  Potassium 3.5 - 5.1 mmol/L 3.3(L) 3.2(L) 3.7  Chloride 98 - 111 mmol/L 104 106 101  CO2 22 - 32 mmol/L _0 Calcium 8.9 - 10.3 mg/dL 9.4 9.2 9.3  Total Protein 6.5 - 8.1 g/dL 6.7 6.9 7.2  Total Bilirubin 0.3 - 1.2 mg/dL 0.3 0.3 0.3  Alkaline Phos 38 - 126 U/L 49 53 45  AST 15 - 41 U/L 21 25 47(H)  ALT 0 - 44 U/L _1 RADIOGRAPHIC STUDIES: MR Brain W Wo Contrast  Result Date: 10/11/2020 CLINICAL DATA:  Assess treatment response.  Metastatic lung cancer. EXAM: MRI HEAD WITHOUT AND WITH CONTRAST TECHNIQUE: Multiplanar, multiecho pulse sequences of the brain and surrounding structures were obtained without and with intravenous contrast. CONTRAST:  35m GADAVIST GADOBUTROL 1 MMOL/ML IV SOLN COMPARISON:  06/11/2020 FINDINGS: BRAIN New Lesions: None. Larger lesions: A high right frontal parietal junction nodule measuring 8 mm on 1100:230 has increased by 2-3 mm and is associated with increased but mild vasogenic edema. Stable or Smaller lesions: 1. 10 mm ring-enhancing lesion in the high and parasagittal left frontal lobe on image 239 2. 4 mm nodule along the cortex of the high and posterior right frontal lobe on 223 3. Punctate subcortical left anterior frontal nodule on  image 222 4. 5 mm nodule in the right parietal cortex on image 196 5. 6 mm ring-enhancing nodule in the anterior right temporal lobe on image 163 A left occipital lesion on prior is no longer seen. Previously questioned right parietal lesion on prior is not redemonstrated Other Brain findings: No incidental infarct, hemorrhage, hydrocephalus, or atrophy. Stable areas of tumor associated chronic blood products. Chronic gliotic appearance along the high left parasagittal metastasis. Vascular: Preserved flow voids Skull and upper cervical spine: Negative for marrow lesion. Sinuses/Orbits: Negative  IMPRESSION: 1. An existing right frontal metastasis has increased by 2 to 3 millimeters and is associated with increased vasogenic edema, presumably treatment related. 2. Other enhancing metastases are stable.  No new metastatic focus. Electronically Signed   By: Monte Fantasia M.D.   On: 10/11/2020 04:25   CT CHEST ABDOMEN PELVIS W CONTRAST  Result Date: 09/15/2020 CLINICAL DATA:  Metastatic non-small cell lung cancer. Worsening dyspnea, fatigue and back pain, on oxygen therapy. Restaging. EXAM: CT CHEST, ABDOMEN, AND PELVIS WITH CONTRAST TECHNIQUE: Multidetector CT imaging of the chest, abdomen and pelvis was performed following the standard protocol during bolus administration of intravenous contrast. CONTRAST:  32m OMNIPAQUE IOHEXOL 300 MG/ML  SOLN COMPARISON:  07/15/2020 CT chest, abdomen and pelvis. FINDINGS: CT CHEST FINDINGS Cardiovascular: Normal heart size. Stable trace pericardial effusion/thickening. Right internal jugular Port-A-Cath terminates near the cavoatrial junction. Atherosclerotic nonaneurysmal thoracic aorta. Top-normal caliber main pulmonary artery (3.2 cm diameter). No central pulmonary emboli. Mediastinum/Nodes: No discrete thyroid nodules. Unremarkable esophagus. No pathologically enlarged axillary nodes. Enlarged short axis diameter 1.6 cm left supraclavicular node (series 2/image 3), previously 1.7 cm, not appreciably changed. Mildly enlarged 1.1 cm short axis diameter right paratracheal node (series 2/image 15), previously 1.1 cm using similar measurement technique, stable. No new pathologically enlarged mediastinal nodes. Mildly enlarged 1.4 cm short axis diameter right infrahilar node (series 2/image 25), previously 1.5 cm, not appreciably changed. No new pathologically enlarged hilar nodes. Lungs/Pleura: No pneumothorax. No pleural effusion. Moderate centrilobular emphysema. Dense patchy sharply marginated consolidation throughout the perihilar right lung with associated  bronchiectasis, volume loss and distortion, compatible with evolving postradiation change. New indistinct 0.7 cm nodular focus of consolidation in the peripheral superior segment right lower lobe (series 4/image 60). Similar bandlike regions of consolidation at both lung apices compatible with evolving postradiation change. No additional new significant pulmonary nodules. Musculoskeletal: No aggressive appearing focal osseous lesions. Mild thoracic spondylosis. CT ABDOMEN PELVIS FINDINGS Hepatobiliary: Normal liver with no liver mass. Normal gallbladder with no radiopaque cholelithiasis. No biliary ductal dilatation. Pancreas: Normal, with no mass or duct dilation. Spleen: Normal size. No mass. Adrenals/Urinary Tract: Normal adrenals. Normal kidneys with no hydronephrosis and no renal mass. Normal bladder. Stomach/Bowel: Normal non-distended stomach. Normal caliber small bowel with no small bowel wall thickening. Normal appendix. Oral contrast transits to the colon. Normal large bowel with no diverticulosis, large bowel wall thickening or pericolonic fat stranding. Vascular/Lymphatic: Atherosclerotic nonaneurysmal abdominal aorta. Patent portal, splenic, hepatic and renal veins. No pathologically enlarged lymph nodes in the abdomen or pelvis. Reproductive: Stable enlarged myomatous uterus with dominant anterior uterine body 4.3 cm fibroid. No adnexal masses. Other: No pneumoperitoneum, ascites or focal fluid collection. Musculoskeletal: No aggressive appearing focal osseous lesions. Marked degenerative disc disease at L5-S1. IMPRESSION: 1. New indistinct 0.7 cm nodular focus of consolidation in the peripheral superior segment right lower lobe, potentially inflammatory/treatment related. Recommend attention on follow-up chest CT in 3 months. 2. Otherwise no potential findings of new or progressive  metastatic disease. Stable mild left supraclavicular, right paratracheal and right infrahilar adenopathy. 3. Otherwise  expected evolution of postradiation change in the perihilar right lung and bilateral lung apices. 4. Stable enlarged myomatous uterus. 5. Aortic Atherosclerosis (ICD10-I70.0) and Emphysema (ICD10-J43.9). Electronically Signed   By: Ilona Sorrel M.D.   On: 09/15/2020 13:17     ASSESSMENT & PLAN Elizabeth Mayer 60 y.o. female with medical history significant for metastatic adenocarcinoma of the lung who presents for follow up.    After review the labs, the records, discussion with the patient the findings are most consistent with metastatic adenocarcinoma the lung without any targetable mutations.  The patient has been started on carboplatin pemetrexed pembrolizumab every 3 weeks with Dr. Julien Nordmann, however the patient has expressed a desire to transition to another provider.  As such we were happy to take up her care here.  She already discontinued the carboplatin and is on maintenance pemetrexed/pembrolizumab therapy. We assumed her care with Cycle 6 on 04/04/2020.  At this time we will proceed with maintenance Pem/Pem therapy.  Her scans have been stable and she has been tolerating the therapy well.  # Metastatic Adenocarcinoma of the Lung #Brain Metastasis --Most recent scan from 09/12/2020 showed the patient is having an excellent response to treatment with Carbo/Pem/Pem chemotherapy.    --no targetable mutations noted on prior workup. --continue to follow with Dr. Laqueta Due Onc for metastatic spread to the brain. Last MRI on 7/7/ 2022 showed concern for right frontal metastasis has increased by 2 to 3 millimeters.  --can consider docetaxel/ramucirumab at time of progression.  --RTC in 3 weeks for next cycle of chemotherapy.   # Fatigue: --Uncertain etiology but differentials include chemotherapy/immunotherapy side effects and progressive disease.  --Monitor closely  # Mid back pain: --likely MSK in nature, no concerning findings on last CT scan.  --continue to monitor   #  Dizziness/Presyncope: --improving, potentially due to dehydration or electrolyte disturbance --continue to monitor    No orders of the defined types were placed in this encounter.   All questions were answered. The patient knows to call the clinic with any problems, questions or concerns.  I have spent a total of 30 minutes minutes of face-to-face and non-face-to-face time, preparing to see the patient, obtaining and/or reviewing separately obtained history, performing a medically appropriate examination, counseling and educating the patient, ordering tests, documenting clinical information in the electronic health record and care coordination.   Ledell Peoples, MD Department of Hematology/Oncology [place] [facility] Phone: [phone] Pager: [phone] Email: [email or web address]   10/11/2020 3:32 PM

## 2020-10-11 NOTE — Patient Instructions (Signed)

## 2020-10-11 NOTE — Patient Instructions (Signed)
[facility]  Discharge Instructions: Thank you for choosing [facility] to provide your oncology and hematology care.   If you have a lab appointment with the [facility], please go directly to the [facility] and check in at the registration area.   Wear comfortable clothing and clothing appropriate for easy access to any Portacath or PICC line.   We strive to give you quality time with your provider. You may need to reschedule your appointment if you arrive late (15 or more minutes).  Arriving late affects you and other patients whose appointments are after yours.  Also, if you miss three or more appointments without notifying the office, you may be dismissed from the clinic at the provider's discretion.      For prescription refill requests, have your pharmacy contact our office and allow 72 hours for refills to be completed.    Today you received the following chemotherapy and/or immunotherapy agents KEYTRUDA and ALimta      To help prevent nausea and vomiting after your treatment, we encourage you to take your nausea medication as directed.  BELOW ARE SYMPTOMS THAT SHOULD BE REPORTED IMMEDIATELY: *FEVER GREATER THAN 100.4 F (38 C) OR HIGHER *CHILLS OR SWEATING *NAUSEA AND VOMITING THAT IS NOT CONTROLLED WITH YOUR NAUSEA MEDICATION *UNUSUAL SHORTNESS OF BREATH *UNUSUAL BRUISING OR BLEEDING *URINARY PROBLEMS (pain or burning when urinating, or frequent urination) *BOWEL PROBLEMS (unusual diarrhea, constipation, pain near the anus) TENDERNESS IN MOUTH AND THROAT WITH OR WITHOUT PRESENCE OF ULCERS (sore throat, sores in mouth, or a toothache) UNUSUAL RASH, SWELLING OR PAIN  UNUSUAL VAGINAL DISCHARGE OR ITCHING   Items with * indicate a potential emergency and should be followed up as soon as possible or go to the Emergency Department if any problems should occur.  Please show the CHEMOTHERAPY ALERT CARD or IMMUNOTHERAPY ALERT CARD at  check-in to the Emergency Department and triage nurse.  Should you have questions after your visit or need to cancel or reschedule your appointment, please contact [facility]  Dept: [phone]  and follow the prompts.  Office hours are 8:00 a.m. to 4:30 p.m. Monday - Friday. Please note that voicemails left after 4:00 p.m. may not be returned until the following business day.  We are closed weekends and major holidays. You have access to a nurse at all times for urgent questions. Please call the main number to the clinic Dept: [phone] and follow the prompts.   For any non-urgent questions, you may also contact your provider using MyChart. We now offer e-Visits for anyone 8 and older to request care online for non-urgent symptoms. For details visit [email or web address].   Also download the MyChart app! Go to the app store, search "MyChart", open the app, select [facility], and log in with your MyChart username and password.  Due to Covid, a mask is required upon entering the hospital/clinic. If you do not have a mask, one will be given to you upon arrival. For doctor visits, patients may have 1 support person aged 62 or older with them. For treatment visits, patients cannot have anyone with them due to current Covid guidelines and our immunocompromised population.   Pembrolizumab injection What is this medication? PEMBROLIZUMAB (pem broe liz ue mab) is a monoclonal antibody. It is used totreat certain types of cancer. This medicine may be used for other purposes; ask your health care provider orpharmacist if you have questions. COMMON BRAND NAME(S): Keytruda What  should I tell my care team before I take this medication? They need to know if you have any of these conditions: autoimmune diseases like Crohn's disease, ulcerative colitis, or lupus have had or planning to have an allogeneic stem cell transplant (uses someone else's stem cells) history of organ  transplant history of chest radiation nervous system problems like myasthenia gravis or Guillain-Barre syndrome an unusual or allergic reaction to pembrolizumab, other medicines, foods, dyes, or preservatives pregnant or trying to get pregnant breast-feeding How should I use this medication? This medicine is for infusion into a vein. It is given by a health careprofessional in a hospital or clinic setting. A special MedGuide will be given to you before each treatment. Be sure to readthis information carefully each time. Talk to your pediatrician regarding the use of this medicine in children. While this drug may be prescribed for children as young as 6 months for selectedconditions, precautions do apply. Overdosage: If you think you have taken too much of this medicine contact apoison control center or emergency room at once. NOTE: This medicine is only for you. Do not share this medicine with others. What if I miss a dose? It is important not to miss your dose. Call your doctor or health careprofessional if you are unable to keep an appointment. What may interact with this medication? Interactions have not been studied. This list may not describe all possible interactions. Give your health care provider a list of all the medicines, herbs, non-prescription drugs, or dietary supplements you use. Also tell them if you smoke, drink alcohol, or use illegaldrugs. Some items may interact with your medicine. What should I watch for while using this medication? Your condition will be monitored carefully while you are receiving thismedicine. You may need blood work done while you are taking this medicine. Do not become pregnant while taking this medicine or for 4 months after stopping it. Women should inform their doctor if they wish to become pregnant or think they might be pregnant. There is a potential for serious side effects to an unborn child. Talk to your health care professional or pharmacist for  more information. Do not breast-feed an infant while taking this medicine orfor 4 months after the last dose. What side effects may I notice from receiving this medication? Side effects that you should report to your doctor or health care professionalas soon as possible: allergic reactions like skin rash, itching or hives, swelling of the face, lips, or tongue bloody or black, tarry breathing problems changes in vision chest pain chills confusion constipation cough diarrhea dizziness or feeling faint or lightheaded fast or irregular heartbeat fever flushing joint pain low blood counts - this medicine may decrease the number of white blood cells, red blood cells and platelets. You may be at increased risk for infections and bleeding. muscle pain muscle weakness pain, tingling, numbness in the hands or feet persistent headache redness, blistering, peeling or loosening of the skin, including inside the mouth signs and symptoms of high blood sugar such as dizziness; dry mouth; dry skin; fruity breath; nausea; stomach pain; increased hunger or thirst; increased urination signs and symptoms of kidney injury like trouble passing urine or change in the amount of urine signs and symptoms of liver injury like dark urine, light-colored stools, loss of appetite, nausea, right upper belly pain, yellowing of the eyes or skin sweating swollen lymph nodes weight loss Side effects that usually do not require medical attention (report to yourdoctor or health care professional if  they continue or are bothersome): decreased appetite hair loss tiredness This list may not describe all possible side effects. Call your doctor for medical advice about side effects. You may report side effects to FDA at1-800-FDA-1088. Where should I keep my medication? This drug is given in a hospital or clinic and will not be stored at home. NOTE: This sheet is a summary. It may not cover all possible information. If you  have questions about this medicine, talk to your doctor, pharmacist, orhealth care provider.  2022 Elsevier/Gold Standard (2019-02-22 21:44:53) Pemetrexed injection What is this medication? PEMETREXED (PEM e TREX ed) is a chemotherapy drug used to treat lung cancers like non-small cell lung cancer and mesothelioma. It may also be used to treatother cancers. This medicine may be used for other purposes; ask your health care provider orpharmacist if you have questions. COMMON BRAND NAME(S): Alimta What should I tell my care team before I take this medication? They need to know if you have any of these conditions: infection (especially a virus infection such as chickenpox, cold sores, or herpes) kidney disease low blood counts, like low white cell, platelet, or red cell counts lung or breathing disease, like asthma radiation therapy an unusual or allergic reaction to pemetrexed, other medicines, foods, dyes, or preservative pregnant or trying to get pregnant breast-feeding How should I use this medication? This drug is given as an infusion into a vein. It is administered in a hospitalor clinic by a specially trained health care professional. Talk to your pediatrician regarding the use of this medicine in children.Special care may be needed. Overdosage: If you think you have taken too much of this medicine contact apoison control center or emergency room at once. NOTE: This medicine is only for you. Do not share this medicine with others. What if I miss a dose? It is important not to miss your dose. Call your doctor or health careprofessional if you are unable to keep an appointment. What may interact with this medication? This medicine may interact with the following medications: Ibuprofen This list may not describe all possible interactions. Give your health care provider a list of all the medicines, herbs, non-prescription drugs, or dietary supplements you use. Also tell them if you smoke,  drink alcohol, or use illegaldrugs. Some items may interact with your medicine. What should I watch for while using this medication? Visit your doctor for checks on your progress. This drug may make you feel generally unwell. This is not uncommon, as chemotherapy can affect healthy cells as well as cancer cells. Report any side effects. Continue your course oftreatment even though you feel ill unless your doctor tells you to stop. In some cases, you may be given additional medicines to help with side effects.Follow all directions for their use. Call your doctor or health care professional for advice if you get a fever, chills or sore throat, or other symptoms of a cold or flu. Do not treat yourself. This drug decreases your body's ability to fight infections. Try toavoid being around people who are sick. This medicine may increase your risk to bruise or bleed. Call your doctor orhealth care professional if you notice any unusual bleeding. Be careful brushing and flossing your teeth or using a toothpick because you may get an infection or bleed more easily. If you have any dental work done,tell your dentist you are receiving this medicine. Avoid taking products that contain aspirin, acetaminophen, ibuprofen, naproxen, or ketoprofen unless instructed by your doctor. These medicines may hide  afever. Call your doctor or health care professional if you get diarrhea or mouthsores. Do not treat yourself. To protect your kidneys, drink water or other fluids as directed while you aretaking this medicine. Do not become pregnant while taking this medicine or for 6 months after stopping it. Women should inform their doctor if they wish to become pregnant or think they might be pregnant. Men should not father a child while taking this medicine and for 3 months after stopping it. This may interfere with the ability to father a child. You should talk to your doctor or health care professional if you are concerned about  your fertility. There is a potential for serious side effects to an unborn child. Talk to your health care professional or pharmacist for more information. Do not breast-feed an infantwhile taking this medicine or for 1 week after stopping it. What side effects may I notice from receiving this medication? Side effects that you should report to your doctor or health care professionalas soon as possible: allergic reactions like skin rash, itching or hives, swelling of the face, lips, or tongue breathing problems redness, blistering, peeling or loosening of the skin, including inside the mouth signs and symptoms of bleeding such as bloody or black, tarry stools; red or dark-brown urine; spitting up blood or brown material that looks like coffee grounds; red spots on the skin; unusual bruising or bleeding from the eye, gums, or nose signs and symptoms of infection like fever or chills; cough; sore throat; pain or trouble passing urine signs and symptoms of kidney injury like trouble passing urine or change in the amount of urine signs and symptoms of liver injury like dark yellow or brown urine; general ill feeling or flu-like symptoms; light-colored stools; loss of appetite; nausea; right upper belly pain; unusually weak or tired; yellowing of the eyes or skin Side effects that usually do not require medical attention (report to yourdoctor or health care professional if they continue or are bothersome): constipation mouth sores nausea, vomiting unusually weak or tired This list may not describe all possible side effects. Call your doctor for medical advice about side effects. You may report side effects to FDA at1-800-FDA-1088. Where should I keep my medication? This drug is given in a hospital or clinic and will not be stored at home. NOTE: This sheet is a summary. It may not cover all possible information. If you have questions about this medicine, talk to your doctor, pharmacist, orhealth care  provider.  2022 Elsevier/Gold Standard (2017-05-12 16:11:33)

## 2020-10-11 NOTE — Progress Notes (Deleted)
  The note originally documented on this encounter has been moved the the encounter in which it belongs.  

## 2020-10-12 ENCOUNTER — Encounter: Payer: Self-pay | Admitting: Physician Assistant

## 2020-10-12 ENCOUNTER — Encounter: Payer: Self-pay | Admitting: Internal Medicine

## 2020-10-14 ENCOUNTER — Other Ambulatory Visit: Payer: Self-pay

## 2020-10-14 ENCOUNTER — Inpatient Hospital Stay (HOSPITAL_BASED_OUTPATIENT_CLINIC_OR_DEPARTMENT_OTHER): Payer: Self-pay | Admitting: Internal Medicine

## 2020-10-14 ENCOUNTER — Encounter: Payer: Self-pay | Admitting: Internal Medicine

## 2020-10-14 ENCOUNTER — Encounter: Payer: BC Managed Care – PPO | Admitting: Nurse Practitioner

## 2020-10-14 ENCOUNTER — Ambulatory Visit: Payer: BC Managed Care – PPO | Admitting: Internal Medicine

## 2020-10-14 VITALS — BP 143/62 | HR 90 | Temp 98.0°F | Resp 19 | Wt 142.4 lb

## 2020-10-14 DIAGNOSIS — C7931 Secondary malignant neoplasm of brain: Secondary | ICD-10-CM

## 2020-10-14 NOTE — Progress Notes (Signed)
[facility] [place], [place] [address] [phone]   Interval Evaluation  Date of Service: 10/14/20 Patient Name: Elizabeth Mayer Patient MRN: [id number] Patient DOB: 1960-06-30 Provider: Ventura Sellers, MD  Identifying Statement:  ELGENE CORAL is a 60 y.o. female with Brain metastases ([place]) [C79.31]   Primary Cancer:  Oncologic History: Oncology History  Non-small cell lung cancer, right ([facility])  11/08/2019 Initial Diagnosis   Non-small cell lung cancer, right ([place])    12/14/2019 - 12/14/2019 Chemotherapy   The patient had cemiplimab-rwlc (LIBTAYO) 350 mg in sodium chloride 0.9 % 100 mL chemo infusion, 350 mg, Intravenous, Once, 0 of 6 cycles   for chemotherapy treatment.     12/14/2019 -  Chemotherapy    Patient is on Treatment Plan: LUNG CARBOPLATIN / PEMETREXED / PEMBROLIZUMAB Q21D INDUCTION X 4 CYCLES / MAINTENANCE PEMETREXED + PEMBROLIZUMAB        CNS Oncologic History 01/12/20: SRS to 22 targets Isidore Moos)  Interval History:  Rondia A Poplin presents today for follow up after MRI brain. No new or progressive changes.  Headaches have been frequent since her most recent course of chemotherapy.  Apart from that frequency has been sporadic.  No issues with her gait, no seizures.  H+P (03/14/20) Patient presents today for follow up after recent MRI brain.  She describes no new or progressive neurologic deficits.  No seizures or headaches.  Still independent with gait and activities of daily living.  Continues on chemotherapy with Dr. Julien Nordmann.  Currently taking decadron 37m daily.  Medications: Current Outpatient Medications on File Prior to Visit  Medication Sig Dispense Refill   acetaminophen (TYLENOL) 500 MG tablet Take 1,000 mg by mouth every 6 (six) hours as needed for moderate pain.      albuterol (VENTOLIN HFA) 108 (90 Base) MCG/ACT inhaler Inhale 2 puffs into the lungs every 6 (six) hours as needed for wheezing or  shortness of breath. (Patient taking differently: Inhale 2 puffs into the lungs 2 (two) times daily. Additional 2 puff's of needed) 18 g 6   benzoyl peroxide (CERAVE ACNE FOAMING CREAM) 4 % external liquid Apply 1 application topically daily. Face     budesonide-formoterol (SYMBICORT) 80-4.5 MCG/ACT inhaler Inhale 2 puffs into the lungs in the morning and at bedtime. 1 each 11   camphor-menthol (SARNA) lotion Apply 1 application topically daily as needed for itching.     Carboxymethylcellulose Sodium (REFRESH CELLUVISC OP) Place 2 drops into both eyes See admin instructions. Instill 2  drop into both eyes twice daily (scheduled) & then use as needed throughout the day for dry/irritated eyes     clindamycin (CLEOCIN T) 1 % lotion Apply 1 application topically daily.     Cyanocobalamin (B-12 COMPLIANCE INJECTION) 1000 MCG/ML KIT Inject 1,000 mcg as directed See admin instructions. As needed give at Chemo     fexofenadine (ALLEGRA) 180 MG tablet Take 180 mg by mouth in the morning.     fluticasone (FLONASE) 50 MCG/ACT nasal spray Place 1 spray into both nostrils daily.     folic acid (FOLVITE) 1 MG tablet Take 1 tablet (1 mg total) by mouth daily. (Patient taking differently: Take 1 mg by mouth in the morning.) 90 tablet 2   guaiFENesin (MUCINEX) 600 MG 12 hr tablet Take 600 mg by mouth 2 (two) times daily.      HYDROcodone-acetaminophen (HYCET) 7.5-325 mg/15 ml solution Take 10 mLs by mouth 2 (two) times daily.  For pain from cancer and radiation reactions (Patient taking differently: Take 10 mLs by mouth 2 (two) times daily as needed for moderate pain or severe pain. For pain from cancer and radiation reactions) 473 mL 0   lidocaine (XYLOCAINE) 2 % solution Use as directed 15 mLs in the mouth or throat as needed for mouth pain. Swallow 30 min prior to meals and at bedtime for throat/esophagus pain 250 mL 1   lidocaine-prilocaine (EMLA) cream Apply 1 application topically as needed. (Patient taking  differently: Apply 1 application topically as needed (port access).) 30 g 2   LORazepam (ATIVAN) 0.5 MG tablet Please take 1 tablet 30 minutes before your scan as needed for anxiety (Patient taking differently: Take 0.5 mg by mouth See admin instructions. Please take 1 tablet 30 minutes before your scan as needed for anxiety) 2 tablet 0   Multiple Vitamin (MULTIVITAMIN WITH MINERALS) TABS tablet Take 1 tablet by mouth daily. Alive     OXYGEN Inhale 3 L/min into the lungs continuous.     potassium chloride 20 MEQ/15ML (10%) SOLN Take 15 mLs (20 mEq total) by mouth 2 (two) times daily. 210 mL 0   prochlorperazine (COMPAZINE) 10 MG tablet Take 1 tablet (10 mg total) by mouth every 6 (six) hours as needed. (Patient taking differently: Take 10 mg by mouth every 6 (six) hours as needed (nausea with chemo).) 30 tablet 2   Soap & Cleansers (MEDERMA AG FACIAL TONER) LIQD Apply 1 application topically daily.     triamcinolone ointment (KENALOG) 0.1 % Apply 1 application topically in the morning and at bedtime.     No current facility-administered medications on file prior to visit.    Allergies:  Allergies  Allergen Reactions   Pseudoephedrine Hypertension   Gabapentin Other (See Comments)    Raise blood pressure and red rings around eyes Blood vessels popped in her eyes   Mobic [Meloxicam] Swelling    Inflamed the area that has inflammation and stabbing pains in the area   Penicillins Hives    Reaction: Childhood   Past Medical History:  Past Medical History:  Diagnosis Date   Anemia    Angina 1982   related to stress   Asthma    in the past,  no current problems   Complication of anesthesia    states anesthesia made her hair fall out   Constipation    Dyspnea    on oxygen at home - 2L via [place]   Fatigue    GERD (gastroesophageal reflux disease)    patient denies this dx   Headache    Heart murmur 1970s   no problems currently   Ingrown toenail    Lung cancer ([facility]) 10/2019    metastatic disease to the brain   On home oxygen therapy    2.2 lpm, 24 hours a day   Past heart attack 1980-1981   pt states she passed out and woke up in hospital- told she had heart attack, but then dr said he couldn't find anything wrong.   Pneumonia    x 1   Past Surgical History:  Past Surgical History:  Procedure Laterality Date   CERVICAL DISC SURGERY  2000   Disc removed from neck    ingrown toe nail surgery Bilateral    IR IMAGING GUIDED PORT INSERTION  02/16/2020   MULTIPLE TOOTH EXTRACTIONS     for braces   RADIOLOGY WITH ANESTHESIA N/A 12/05/2019   Procedure: MRI BRAIN WITH AND WITHOUT CONTRAST;  Surgeon: Radiologist, Medication, MD;  Location: [facility];  Service: Radiology;  Laterality: N/A;   RADIOLOGY WITH ANESTHESIA N/A 01/02/2020   Procedure: MRI BRAIN WITH AND WITHOUT CONTRAST;  Surgeon: Radiologist, Medication, MD;  Location: [facility];  Service: Radiology;  Laterality: N/A;   RADIOLOGY WITH ANESTHESIA N/A 02/20/2020   Procedure: MRI WITH ANESTHESIA OF BRAIN WITH AND WITHOUT CONTRAST;  Surgeon: Radiologist, Medication, MD;  Location: [facility];  Service: Radiology;  Laterality: N/A;   RADIOLOGY WITH ANESTHESIA N/A 06/11/2020   Procedure: MRI WITH ANESTHESIA OF BRAIN WITH AND WITHOUT CONTRAST;  Surgeon: Radiologist, Medication, MD;  Location: [facility];  Service: Radiology;  Laterality: N/A;   RADIOLOGY WITH ANESTHESIA N/A 10/10/2020   Procedure: MRI WITH ANESTHESIA BRAIN WITH AND WITHOUT CONTRAST;  Surgeon: Radiologist, Medication, MD;  Location: [facility];  Service: Radiology;  Laterality: N/A;   TONSILLECTOMY     Social History:  Social History   Socioeconomic History   Marital status: Legally Separated    Spouse name: Not on file   Number of children: 0   Years of education: Not on file   Highest education level: Not on file  Occupational History   Occupation: Secondary school teacher  Tobacco Use   Smoking status: Former    Packs/day: 1.00    Years: 20.00    Pack years: 20.00     Types: Cigarettes    Start date: 66    Quit date: 2001    Years since quitting: 21.5   Smokeless tobacco: Never  Vaping Use   Vaping Use: Never used  Substance and Sexual Activity   Alcohol use: No   Drug use: No   Sexual activity: Yes    Birth control/protection: None, Post-menopausal  Other Topics Concern   Not on file  Social History Narrative   Not on file   Social Determinants of Health   Financial Resource Strain: High Risk   Difficulty of Paying Living Expenses: Hard  Food Insecurity: No Food Insecurity   Worried About Running Out of Food in the Last Year: Never true   Ran Out of Food in the Last Year: Never true  Transportation Needs: No Transportation Needs   Lack of Transportation (Medical): No   Lack of Transportation (Non-Medical): No  Physical Activity: Inactive   Days of Exercise per Week: 0 days   Minutes of Exercise per Session: 0 min  Stress: Not on file  Social Connections: Moderately Isolated   Frequency of Communication with Friends and Family: Twice a week   Frequency of Social Gatherings with Friends and Family: Twice a week   Attends Religious Services: More than 4 times per year   Active Member of [employer] or Organizations: No   Attends Music therapist: Not on file   Marital Status: Separated  Intimate Partner Violence: Not on file   Family History:  Family History  Problem Relation Age of Onset   Arthritis Mother    Heart failure Father     Review of Systems: Constitutional: Doesn't report fevers, chills or abnormal weight loss Eyes: Doesn't report blurriness of vision Ears, nose, mouth, throat, and face: Doesn't report sore throat Respiratory: Doesn't report cough, dyspnea or wheezes Cardiovascular: Doesn't report palpitation, chest discomfort  Gastrointestinal:  Doesn't report nausea, constipation, diarrhea GU: Doesn't report incontinence Skin: Doesn't report skin rashes Neurological: Per HPI Musculoskeletal: Doesn't  report joint pain Behavioral/Psych: Doesn't report anxiety  Physical Exam: Vitals:   10/14/20 1116  BP: (!) 143/62  Pulse: 90  Resp: 19  Temp: 98 F (36.7 C)  SpO2: 100%    KPS: 90. General: With supplemental O2 Head: Normal EENT: No conjunctival injection or scleral icterus.  Lungs: Resp effort normal Cardiac: Regular rate Abdomen: Non-distended abdomen Skin: No rashes cyanosis or petechiae. Extremities: No clubbing or edema  Neurologic Exam: Mental Status: Awake, alert, attentive to examiner. Oriented to self and environment. Language is fluent with intact comprehension.  Cranial Nerves: Visual acuity is grossly normal. Visual fields are full. Extra-ocular movements intact. No ptosis. Face is symmetric Motor: Tone and bulk are normal. Power is full in both arms and legs. Reflexes are symmetric, no pathologic reflexes present.  Sensory: Intact to light touch Gait: Normal.   Labs: I have reviewed the data as listed    Component Value Date/Time   NA 145 10/11/2020 1014   NA 141 08/26/2020 1546   K 3.3 (L) 10/11/2020 1014   CL 104 10/11/2020 1014   CO2 30 10/11/2020 1014   GLUCOSE 96 10/11/2020 1014   BUN 12 10/11/2020 1014   BUN 12 08/26/2020 1546   CREATININE 1.08 (H) 10/11/2020 1014   CALCIUM 9.4 10/11/2020 1014   PROT 6.7 10/11/2020 1014   PROT 6.8 08/26/2020 1546   ALBUMIN 3.4 (L) 10/11/2020 1014   ALBUMIN 4.1 08/26/2020 1546   AST 21 10/11/2020 1014   ALT 11 10/11/2020 1014   ALKPHOS 49 10/11/2020 1014   BILITOT 0.3 10/11/2020 1014   GFRNONAA 58 (L) 10/11/2020 1014   GFRAA >60 01/08/2020 1342   GFRAA >60 01/04/2020 0907   Lab Results  Component Value Date   WBC 4.9 10/11/2020   NEUTROABS 3.1 10/11/2020   HGB 10.0 (L) 10/11/2020   HCT 30.2 (L) 10/11/2020   MCV 93.5 10/11/2020   PLT 142 (L) 10/11/2020    Imaging:  MR Brain W Wo Contrast  Result Date: 10/11/2020 CLINICAL DATA:  Assess treatment response.  Metastatic lung cancer. EXAM: MRI HEAD  WITHOUT AND WITH CONTRAST TECHNIQUE: Multiplanar, multiecho pulse sequences of the brain and surrounding structures were obtained without and with intravenous contrast. CONTRAST:  86m GADAVIST GADOBUTROL 1 MMOL/ML IV SOLN COMPARISON:  06/11/2020 FINDINGS: BRAIN New Lesions: None. Larger lesions: A high right frontal parietal junction nodule measuring 8 mm on 1100:230 has increased by 2-3 mm and is associated with increased but mild vasogenic edema. Stable or Smaller lesions: 1. 10 mm ring-enhancing lesion in the high and parasagittal left frontal lobe on image 239 2. 4 mm nodule along the cortex of the high and posterior right frontal lobe on 223 3. Punctate subcortical left anterior frontal nodule on image 222 4. 5 mm nodule in the right parietal cortex on image 196 5. 6 mm ring-enhancing nodule in the anterior right temporal lobe on image 163 A left occipital lesion on prior is no longer seen. Previously questioned right parietal lesion on prior is not redemonstrated Other Brain findings: No incidental infarct, hemorrhage, hydrocephalus, or atrophy. Stable areas of tumor associated chronic blood products. Chronic gliotic appearance along the high left parasagittal metastasis. Vascular: Preserved flow voids Skull and upper cervical spine: Negative for marrow lesion. Sinuses/Orbits: Negative IMPRESSION: 1. An existing right frontal metastasis has increased by 2 to 3 millimeters and is associated with increased vasogenic edema, presumably treatment related. 2. Other enhancing metastases are stable.  No new metastatic focus. Electronically Signed   By: JMonte FantasiaM.D.   On: 10/11/2020 04:25     CJasperClinician Interpretation: I have personally reviewed the radiological  images as listed.  My interpretation, in the context of the patient's clinical presentation, is treatment effect vs true progression   Assessment/Plan Brain metastases ([facility]) [C79.31]  Nickola A Boehlke is clinically stable today.  Brain MRI  demonstrates mostly stable disease.  High R front metastasis is slightly larger with some edema, consistent with suspected post-SRS treatment effect.  Recommend continued imaging monitoring only at this time.  For headaches, con't PRN analgesia.  If frequency does not improve following recovery from infusion, can consider amytryptiline.   We appreciate the opportunity to participate in the care of [place].   We ask that Gravette return to clinic in 3 months following next brain MRI, or sooner as needed.  All questions were answered. The patient knows to call the clinic with any problems, questions or concerns. No barriers to learning were detected.  The total time spent in the encounter was 30 minutes and more than 50% was on counseling and review of test results   Ventura Sellers, MD Medical Director of Neuro-Oncology [facility] at [facility] 10/14/20 11:15 AM

## 2020-10-15 ENCOUNTER — Other Ambulatory Visit: Payer: Self-pay | Admitting: Radiation Therapy

## 2020-11-07 ENCOUNTER — Ambulatory Visit: Payer: BC Managed Care – PPO

## 2020-11-12 ENCOUNTER — Telehealth: Payer: Self-pay | Admitting: Hematology and Oncology

## 2020-11-12 NOTE — Telephone Encounter (Signed)
Scheduled per provider request. Called and left msg.

## 2020-11-13 ENCOUNTER — Telehealth: Payer: Self-pay | Admitting: *Deleted

## 2020-11-13 ENCOUNTER — Other Ambulatory Visit: Payer: Self-pay | Admitting: *Deleted

## 2020-11-13 DIAGNOSIS — C3491 Malignant neoplasm of unspecified part of right bronchus or lung: Secondary | ICD-10-CM

## 2020-11-13 DIAGNOSIS — C7931 Secondary malignant neoplasm of brain: Secondary | ICD-10-CM

## 2020-11-13 NOTE — Telephone Encounter (Signed)
Received call from pt. She states she is concerned about coming in fro treatment on Friday as her BCBS dropped her and currently she has no insurance. She has applied for medicaid this past week but was unable to turn in her bill for her treatment on 10/11/20 as it has not been prepared yet.  Pt is afraid of incurring another big bill.  Please advise

## 2020-11-13 NOTE — Progress Notes (Signed)
SW referral placed

## 2020-11-14 ENCOUNTER — Telehealth: Payer: Self-pay | Admitting: Hematology and Oncology

## 2020-11-14 ENCOUNTER — Encounter: Payer: Self-pay | Admitting: General Practice

## 2020-11-14 NOTE — Progress Notes (Signed)
[facility] CSW Progress Notes  Called patient at request of [facility] [employer] copy.  Patient concerned that tomorrow's treatment will not be paid for - she wants to do all she can to reduce her financial burden for treatment.   She applied on her own to DSS for Medicaid in July 2022 ([place])..  She received Social Security disability in March 2022.  Her financial advocate and [facility] are working on her behalf to determine eligibility for any treatment assistance.  CSW will submit her application for [employer] gas card program - card will be mailed to her home.    Edwyna Shell, LCSW Clinical Social Worker Phone:  [phone]

## 2020-11-14 NOTE — Telephone Encounter (Signed)
R/s appts per 8/11 sch msg. Pt aware.

## 2020-11-15 ENCOUNTER — Ambulatory Visit: Payer: Self-pay | Admitting: Hematology and Oncology

## 2020-11-15 ENCOUNTER — Ambulatory Visit: Payer: Self-pay

## 2020-11-15 ENCOUNTER — Other Ambulatory Visit: Payer: Self-pay

## 2020-11-18 ENCOUNTER — Encounter: Payer: Self-pay | Admitting: Internal Medicine

## 2020-11-18 ENCOUNTER — Encounter: Payer: Self-pay | Admitting: Physician Assistant

## 2020-11-19 NOTE — Progress Notes (Signed)
..  Patient is receiving Replacement Medication. Medication: Alimta (pemetrexed) Manufacture: [employer] Approval Dates: Approved from 11/19/2020 until 11/18/2021. ID: [id number] Reason: Self Pay First DOS: 10/11/2020.

## 2020-11-20 NOTE — Progress Notes (Addendum)
..  Patient is receiving Assistance Medication - Supplied Externally. Medication: Keytruda (pembrolizumab) Manufacture: [employer] Approval Dates: Approved from 11/20/2020 until 11/20/2021. ID: [id number] Reason: Self Pay First DOS: 11/29/2020. Marland KitchenJuan Quam, CPhT IV Drug Replacement Specialist [facility] Phone: [phone]

## 2020-11-29 ENCOUNTER — Inpatient Hospital Stay (HOSPITAL_BASED_OUTPATIENT_CLINIC_OR_DEPARTMENT_OTHER): Payer: Medicaid Other | Admitting: Hematology and Oncology

## 2020-11-29 ENCOUNTER — Other Ambulatory Visit: Payer: Self-pay

## 2020-11-29 ENCOUNTER — Inpatient Hospital Stay: Payer: Medicaid Other

## 2020-11-29 ENCOUNTER — Inpatient Hospital Stay: Payer: Medicaid Other | Attending: Internal Medicine

## 2020-11-29 ENCOUNTER — Encounter: Payer: Self-pay | Admitting: Hematology and Oncology

## 2020-11-29 VITALS — BP 168/80 | HR 78 | Temp 97.3°F | Resp 19 | Ht 64.4 in | Wt 144.3 lb

## 2020-11-29 DIAGNOSIS — C3491 Malignant neoplasm of unspecified part of right bronchus or lung: Secondary | ICD-10-CM

## 2020-11-29 DIAGNOSIS — Z7951 Long term (current) use of inhaled steroids: Secondary | ICD-10-CM | POA: Insufficient documentation

## 2020-11-29 DIAGNOSIS — I252 Old myocardial infarction: Secondary | ICD-10-CM | POA: Diagnosis not present

## 2020-11-29 DIAGNOSIS — Z5111 Encounter for antineoplastic chemotherapy: Secondary | ICD-10-CM | POA: Insufficient documentation

## 2020-11-29 DIAGNOSIS — Z79899 Other long term (current) drug therapy: Secondary | ICD-10-CM | POA: Insufficient documentation

## 2020-11-29 DIAGNOSIS — C7931 Secondary malignant neoplasm of brain: Secondary | ICD-10-CM | POA: Diagnosis not present

## 2020-11-29 DIAGNOSIS — Z5112 Encounter for antineoplastic immunotherapy: Secondary | ICD-10-CM | POA: Diagnosis not present

## 2020-11-29 DIAGNOSIS — Z95828 Presence of other vascular implants and grafts: Secondary | ICD-10-CM | POA: Diagnosis not present

## 2020-11-29 DIAGNOSIS — J45909 Unspecified asthma, uncomplicated: Secondary | ICD-10-CM | POA: Diagnosis not present

## 2020-11-29 LAB — CBC WITH DIFFERENTIAL (CANCER CENTER ONLY)
Abs Immature Granulocytes: 0.01 10*3/uL (ref 0.00–0.07)
Basophils Absolute: 0.1 10*3/uL (ref 0.0–0.1)
Basophils Relative: 1 %
Eosinophils Absolute: 0.3 10*3/uL (ref 0.0–0.5)
Eosinophils Relative: 6 %
HCT: 33.1 % — ABNORMAL LOW (ref 36.0–46.0)
Hemoglobin: 10.7 g/dL — ABNORMAL LOW (ref 12.0–15.0)
Immature Granulocytes: 0 %
Lymphocytes Relative: 19 %
Lymphs Abs: 0.9 10*3/uL (ref 0.7–4.0)
MCH: 30.1 pg (ref 26.0–34.0)
MCHC: 32.3 g/dL (ref 30.0–36.0)
MCV: 93.2 fL (ref 80.0–100.0)
Monocytes Absolute: 0.4 10*3/uL (ref 0.1–1.0)
Monocytes Relative: 8 %
Neutro Abs: 3.1 10*3/uL (ref 1.7–7.7)
Neutrophils Relative %: 66 %
Platelet Count: 138 10*3/uL — ABNORMAL LOW (ref 150–400)
RBC: 3.55 MIL/uL — ABNORMAL LOW (ref 3.87–5.11)
RDW: 13.2 % (ref 11.5–15.5)
WBC Count: 4.8 10*3/uL (ref 4.0–10.5)
nRBC: 0 % (ref 0.0–0.2)

## 2020-11-29 LAB — CMP (CANCER CENTER ONLY)
ALT: 9 U/L (ref 0–44)
AST: 21 U/L (ref 15–41)
Albumin: 3.6 g/dL (ref 3.5–5.0)
Alkaline Phosphatase: 52 U/L (ref 38–126)
Anion gap: 8 (ref 5–15)
BUN: 13 mg/dL (ref 8–23)
CO2: 29 mmol/L (ref 22–32)
Calcium: 9.3 mg/dL (ref 8.9–10.3)
Chloride: 107 mmol/L (ref 98–111)
Creatinine: 1.17 mg/dL — ABNORMAL HIGH (ref 0.44–1.00)
GFR, Estimated: 53 mL/min — ABNORMAL LOW (ref >60)
Glucose, Bld: 93 mg/dL (ref 70–99)
Potassium: 3.8 mmol/L (ref 3.5–5.1)
Sodium: 144 mmol/L (ref 135–145)
Total Bilirubin: 0.3 mg/dL (ref 0.3–1.2)
Total Protein: 6.7 g/dL (ref 6.5–8.1)

## 2020-11-29 LAB — MAGNESIUM: Magnesium: 1.8 mg/dL (ref 1.7–2.4)

## 2020-11-29 LAB — TSH: TSH: 1.546 u[IU]/mL (ref 0.308–3.960)

## 2020-11-29 MED ORDER — SODIUM CHLORIDE 0.9% FLUSH
10.0000 mL | Freq: Once | INTRAVENOUS | Status: AC
  Administered 2020-11-29: 10 mL

## 2020-11-29 MED ORDER — SODIUM CHLORIDE 0.9 % IV SOLN: Freq: Once | INTRAVENOUS | Status: AC

## 2020-11-29 MED ORDER — SODIUM CHLORIDE 0.9 % IV SOLN
500.0000 mg/m2 | Freq: Once | INTRAVENOUS | Status: AC
  Administered 2020-11-29: 900 mg via INTRAVENOUS
  Filled 2020-11-29: qty 20

## 2020-11-29 MED ORDER — HEPARIN SOD (PORK) LOCK FLUSH 100 UNIT/ML IV SOLN
500.0000 [IU] | Freq: Once | INTRAVENOUS | Status: AC | PRN
  Administered 2020-11-29: 500 [IU]

## 2020-11-29 MED ORDER — SODIUM CHLORIDE 0.9 % IV SOLN
200.0000 mg | Freq: Once | INTRAVENOUS | Status: AC
  Administered 2020-11-29: 200 mg via INTRAVENOUS
  Filled 2020-11-29: qty 8

## 2020-11-29 MED ORDER — PROCHLORPERAZINE MALEATE 10 MG PO TABS
10.0000 mg | ORAL_TABLET | Freq: Once | ORAL | Status: AC
  Administered 2020-11-29: 10 mg via ORAL
  Filled 2020-11-29: qty 1

## 2020-11-29 MED ORDER — SODIUM CHLORIDE 0.9% FLUSH
10.0000 mL | INTRAVENOUS | Status: DC | PRN
Start: 2020-11-29 — End: 2020-11-29
  Administered 2020-11-29: 10 mL

## 2020-11-29 MED ORDER — CYANOCOBALAMIN 1000 MCG/ML IJ SOLN
1000.0000 ug | Freq: Once | INTRAMUSCULAR | Status: AC
  Administered 2020-11-29: 1000 ug via INTRAMUSCULAR
  Filled 2020-11-29: qty 1

## 2020-11-29 NOTE — Progress Notes (Signed)
Met with patient at registration to provide GFE(Good Faith Estimate). Patient states she has applied for Medicaid and submitted her supporting documents and spoke with her worker. She is awaiting decision.  She has my card for any additional financial questions or concerns.

## 2020-11-29 NOTE — Patient Instructions (Signed)
[facility] ONCOLOGY  Discharge Instructions: Thank you for choosing [facility] to provide your oncology and hematology care.   If you have a lab appointment with the [facility], please go directly to the [facility] and check in at the registration area.   Wear comfortable clothing and clothing appropriate for easy access to any Portacath or PICC line.   We strive to give you quality time with your provider. You may need to reschedule your appointment if you arrive late (15 or more minutes).  Arriving late affects you and other patients whose appointments are after yours.  Also, if you miss three or more appointments without notifying the office, you may be dismissed from the clinic at the provider's discretion.      For prescription refill requests, have your pharmacy contact our office and allow 72 hours for refills to be completed.    Today you received the following chemotherapy and/or immunotherapy agents Keytruda and alimta      To help prevent nausea and vomiting after your treatment, we encourage you to take your nausea medication as directed.  BELOW ARE SYMPTOMS THAT SHOULD BE REPORTED IMMEDIATELY: *FEVER GREATER THAN 100.4 F (38 C) OR HIGHER *CHILLS OR SWEATING *NAUSEA AND VOMITING THAT IS NOT CONTROLLED WITH YOUR NAUSEA MEDICATION *UNUSUAL SHORTNESS OF BREATH *UNUSUAL BRUISING OR BLEEDING *URINARY PROBLEMS (pain or burning when urinating, or frequent urination) *BOWEL PROBLEMS (unusual diarrhea, constipation, pain near the anus) TENDERNESS IN MOUTH AND THROAT WITH OR WITHOUT PRESENCE OF ULCERS (sore throat, sores in mouth, or a toothache) UNUSUAL RASH, SWELLING OR PAIN  UNUSUAL VAGINAL DISCHARGE OR ITCHING   Items with * indicate a potential emergency and should be followed up as soon as possible or go to the Emergency Department if any problems should occur.  Please show the CHEMOTHERAPY ALERT CARD or IMMUNOTHERAPY ALERT CARD at  check-in to the Emergency Department and triage nurse.  Should you have questions after your visit or need to cancel or reschedule your appointment, please contact [facility]  Dept: [phone]  and follow the prompts.  Office hours are 8:00 a.m. to 4:30 p.m. Monday - Friday. Please note that voicemails left after 4:00 p.m. may not be returned until the following business day.  We are closed weekends and major holidays. You have access to a nurse at all times for urgent questions. Please call the main number to the clinic Dept: [phone] and follow the prompts.   For any non-urgent questions, you may also contact your provider using MyChart. We now offer e-Visits for anyone 78 and older to request care online for non-urgent symptoms. For details visit [email or web address].   Also download the MyChart app! Go to the app store, search "MyChart", open the app, select [facility], and log in with your MyChart username and password.  Due to Covid, a mask is required upon entering the hospital/clinic. If you do not have a mask, one will be given to you upon arrival. For doctor visits, patients may have 1 support person aged 30 or older with them. For treatment visits, patients cannot have anyone with them due to current Covid guidelines and our immunocompromised population.

## 2020-11-29 NOTE — Progress Notes (Signed)
[place] [phone]   [phone]  PROGRESS NOTE  Patient Care Team: Minette Brine, FNP as PCP - General (General Practice)  Hematological/Oncological History # Metastatic Adenocarcinoma of the Lung #Brain Metastasis 1) 11/08/2019: establish care with Dr. Julien Nordmann and his PA Cassie Heilingoetter. Noted to have widely metastatic lung cancer with involvement of the brain, lymph nodes, and right lung.  2) 12/04/2019: palliative radiation to the right lung mass/cervical adenopathy 3) 9/7/02021: started therapy with Carbo/Pem/Pem 4) 9/29-10/09/2019: patient underwent SRS to the brain mets 5) 03/22/2020: repeat CT C/A/P and neck scheduled. Transfer care to Dr. Lorenso Courier.   6) 03/22/2020: CT C/A/P showed interval significant improvement in previously demonstrated extensive confluent lymphadenopathy in the superior mediastinum and right hilar regions. 7) 08/15/2020: Holding chemotherapy due to poor Hgb and fatigue. Allowing patient a brief 'chemo holiday' to rebound appropriately from last cycle.  8) 08/29/2020: restarted chemotherapy, Cycle 12 Day 1 9) 09/19/2020: patient requested to HOLD Cycle 13 in setting of fatigue/weakness.  10) 11/29/2020: Cycle 14 Day 1 of chemotherapy, delayed start due to insurance issues.   Interval History:  Elizabeth Mayer 60 y.o. female with medical history significant for metastatic adenocarcinoma of the lung who presents for follow up. The patient's last visit was on 10/11/2020 when she received Cycle 13, Day 1 of carbo/pem/pem. Unfortunately chemotherapy was held in the interim since then due to insurance issues, which have been resolved.   On exam today Elizabeth Mayer reports she has been well overall in the interim since her last visit.  She does have headaches "off-and-on".  She notes that the main reason we have not been able to see her in timely fashion is due to insurance issues.  These have subsequently been resolved.  Patient does  have Medicaid pending.  Overall she notes that she has been tolerating chemotherapy well and did not have any major side effects after her last treatment.  She is on 3 L of oxygen and notes that this level has been stable.  She denies any fevers, chills, night sweats, chest pain or cough. She has no other complaints.  A full 10 point ROS is listed below.  MEDICAL HISTORY:  Past Medical History:  Diagnosis Date   Anemia    Angina 1982   related to stress   Asthma    in the past,  no current problems   Complication of anesthesia    states anesthesia made her hair fall out   Constipation    Dyspnea    on oxygen at home - 2L via [place]   Fatigue    GERD (gastroesophageal reflux disease)    patient denies this dx   Headache    Heart murmur 1970s   no problems currently   Ingrown toenail    Lung cancer ([place]) 10/2019   metastatic disease to the brain   On home oxygen therapy    2.2 lpm, 24 hours a day   Past heart attack 1980-1981   pt states she passed out and woke up in hospital- told she had heart attack, but then dr said he couldn't find anything wrong.   Pneumonia    x 1    SURGICAL HISTORY: Past Surgical History:  Procedure Laterality Date   CERVICAL DISC SURGERY  2000   Disc removed from neck    ingrown toe nail surgery Bilateral    IR IMAGING GUIDED PORT INSERTION  02/16/2020   MULTIPLE TOOTH EXTRACTIONS     for braces  RADIOLOGY WITH ANESTHESIA N/A 12/05/2019   Procedure: MRI BRAIN WITH AND WITHOUT CONTRAST;  Surgeon: Radiologist, Medication, MD;  Location: [facility];  Service: Radiology;  Laterality: N/A;   RADIOLOGY WITH ANESTHESIA N/A 01/02/2020   Procedure: MRI BRAIN WITH AND WITHOUT CONTRAST;  Surgeon: Radiologist, Medication, MD;  Location: [facility];  Service: Radiology;  Laterality: N/A;   RADIOLOGY WITH ANESTHESIA N/A 02/20/2020   Procedure: MRI WITH ANESTHESIA OF BRAIN WITH AND WITHOUT CONTRAST;  Surgeon: Radiologist, Medication, MD;  Location: [facility];  Service: Radiology;   Laterality: N/A;   RADIOLOGY WITH ANESTHESIA N/A 06/11/2020   Procedure: MRI WITH ANESTHESIA OF BRAIN WITH AND WITHOUT CONTRAST;  Surgeon: Radiologist, Medication, MD;  Location: [facility];  Service: Radiology;  Laterality: N/A;   RADIOLOGY WITH ANESTHESIA N/A 10/10/2020   Procedure: MRI WITH ANESTHESIA BRAIN WITH AND WITHOUT CONTRAST;  Surgeon: Radiologist, Medication, MD;  Location: [facility];  Service: Radiology;  Laterality: N/A;   TONSILLECTOMY      SOCIAL HISTORY: Social History   Socioeconomic History   Marital status: Legally Separated    Spouse name: Not on file   Number of children: 0   Years of education: Not on file   Highest education level: Not on file  Occupational History   Occupation: Secondary school teacher  Tobacco Use   Smoking status: Former    Packs/day: 1.00    Years: 20.00    Pack years: 20.00    Types: Cigarettes    Start date: 33    Quit date: 2001    Years since quitting: 21.6   Smokeless tobacco: Never  Vaping Use   Vaping Use: Never used  Substance and Sexual Activity   Alcohol use: No   Drug use: No   Sexual activity: Yes    Birth control/protection: None, Post-menopausal  Other Topics Concern   Not on file  Social History Narrative   Not on file   Social Determinants of Health   Financial Resource Strain: Not on file  Food Insecurity: Not on file  Transportation Needs: Not on file  Physical Activity: Not on file  Stress: Not on file  Social Connections: Not on file  Intimate Partner Violence: Not on file    FAMILY HISTORY: Family History  Problem Relation Age of Onset   Arthritis Mother    Heart failure Father     ALLERGIES:  is allergic to pseudoephedrine, gabapentin, mobic [meloxicam], and penicillins.  MEDICATIONS:  Current Outpatient Medications  Medication Sig Dispense Refill   acetaminophen (TYLENOL) 500 MG tablet Take 1,000 mg by mouth every 6 (six) hours as needed for moderate pain.      albuterol (VENTOLIN HFA) 108 (90 Base)  MCG/ACT inhaler Inhale 2 puffs into the lungs every 6 (six) hours as needed for wheezing or shortness of breath. (Patient taking differently: Inhale 2 puffs into the lungs 2 (two) times daily. Additional 2 puff's of needed) 18 g 6   benzoyl peroxide (CERAVE ACNE FOAMING CREAM) 4 % external liquid Apply 1 application topically daily. Face     budesonide-formoterol (SYMBICORT) 80-4.5 MCG/ACT inhaler Inhale 2 puffs into the lungs in the morning and at bedtime. 1 each 11   camphor-menthol (SARNA) lotion Apply 1 application topically daily as needed for itching.     Carboxymethylcellulose Sodium (REFRESH CELLUVISC OP) Place 2 drops into both eyes See admin instructions. Instill 2  drop into both eyes twice daily (scheduled) & then use as needed throughout the day for dry/irritated eyes  clindamycin (CLEOCIN T) 1 % lotion Apply 1 application topically daily.     Cyanocobalamin (B-12 COMPLIANCE INJECTION) 1000 MCG/ML KIT Inject 1,000 mcg as directed See admin instructions. As needed give at Chemo     fexofenadine (ALLEGRA) 180 MG tablet Take 180 mg by mouth in the morning.     fluticasone (FLONASE) 50 MCG/ACT nasal spray Place 1 spray into both nostrils daily.     folic acid (FOLVITE) 1 MG tablet Take 1 tablet (1 mg total) by mouth daily. (Patient taking differently: Take 1 mg by mouth in the morning.) 90 tablet 2   guaiFENesin (MUCINEX) 600 MG 12 hr tablet Take 600 mg by mouth 2 (two) times daily.      HYDROcodone-acetaminophen (HYCET) 7.5-325 mg/15 ml solution Take 10 mLs by mouth 2 (two) times daily. For pain from cancer and radiation reactions (Patient taking differently: Take 10 mLs by mouth 2 (two) times daily as needed for moderate pain or severe pain. For pain from cancer and radiation reactions) 473 mL 0   lidocaine (XYLOCAINE) 2 % solution Use as directed 15 mLs in the mouth or throat as needed for mouth pain. Swallow 30 min prior to meals and at bedtime for throat/esophagus pain 250 mL 1    lidocaine-prilocaine (EMLA) cream Apply 1 application topically as needed. (Patient taking differently: Apply 1 application topically as needed (port access).) 30 g 2   LORazepam (ATIVAN) 0.5 MG tablet Please take 1 tablet 30 minutes before your scan as needed for anxiety (Patient taking differently: Take 0.5 mg by mouth See admin instructions. Please take 1 tablet 30 minutes before your scan as needed for anxiety) 2 tablet 0   Multiple Vitamin (MULTIVITAMIN WITH MINERALS) TABS tablet Take 1 tablet by mouth daily. Alive     OXYGEN Inhale 3 L/min into the lungs continuous.     potassium chloride 20 MEQ/15ML (10%) SOLN Take 15 mLs (20 mEq total) by mouth 2 (two) times daily. 210 mL 0   prochlorperazine (COMPAZINE) 10 MG tablet Take 1 tablet (10 mg total) by mouth every 6 (six) hours as needed. (Patient taking differently: Take 10 mg by mouth every 6 (six) hours as needed (nausea with chemo).) 30 tablet 2   Soap & Cleansers (MEDERMA AG FACIAL TONER) LIQD Apply 1 application topically daily.     triamcinolone ointment (KENALOG) 0.1 % Apply 1 application topically in the morning and at bedtime.     No current facility-administered medications for this visit.    REVIEW OF SYSTEMS:   Constitutional: ( - ) fevers, ( - )  chills , ( - ) night sweats Eyes: ( - ) blurriness of vision, ( - ) double vision, ( - ) watery eyes Ears, nose, mouth, throat, and face: ( - ) mucositis, ( - ) sore throat Respiratory: ( - ) cough, ( - ) dyspnea, ( - ) wheezes Cardiovascular: ( - ) palpitation, ( - ) chest discomfort, ( - ) lower extremity swelling Gastrointestinal:  ( - ) nausea, ( - ) heartburn, ( - ) change in bowel habits Skin: ( - ) abnormal skin rashes Lymphatics: ( - ) new lymphadenopathy, ( - ) easy bruising Neurological: ( - ) numbness, ( - ) tingling, ( - ) new weaknesses Behavioral/Psych: ( - ) mood change, ( - ) new changes  All other systems were reviewed with the patient and are negative.  PHYSICAL  EXAMINATION: ECOG PERFORMANCE STATUS: 3 - Symptomatic, >50% confined to bed  Vitals:  11/29/20 1201  BP: (!) 168/80  Pulse: 78  Resp: 19  Temp: (!) 97.3 F (36.3 C)  SpO2: 97%   Filed Weights   11/29/20 1201  Weight: 144 lb 4.8 oz (65.5 kg)    GENERAL: well appearing middle aged [place] American female. alert, no distress and comfortable SKIN: skin color, texture, turgor are normal, no rashes or significant lesions EYES: conjunctiva are pink and non-injected, sclera clear LUNGS: clear to auscultation and percussion with normal breathing effort HEART: regular rate & rhythm and no murmurs and no lower extremity edema Musculoskeletal: no cyanosis of digits and no clubbing.  PSYCH: alert & oriented x 3, fluent speech NEURO: no focal motor/sensory deficits   LABORATORY DATA:  I have reviewed the data as listed CBC Latest Ref Rng & Units 11/29/2020 10/11/2020 09/19/2020  WBC 4.0 - 10.5 K/uL 4.8 4.9 3.6(L)  Hemoglobin 12.0 - 15.0 g/dL 10.7(L) 10.0(L) 9.5(L)  Hematocrit 36.0 - 46.0 % 33.1(L) 30.2(L) 29.1(L)  Platelets 150 - 400 K/uL 138(L) 142(L) 155    CMP Latest Ref Rng & Units 11/29/2020 10/11/2020 09/19/2020  Glucose 70 - 99 mg/dL 93 96 130(H)  BUN 8 - 23 mg/dL 13 12 7(L)  Creatinine 0.44 - 1.00 mg/dL 1.17(H) 1.08(H) 1.04(H)  Sodium 135 - 145 mmol/L 144 145 143  Potassium 3.5 - 5.1 mmol/L 3.8 3.3(L) 3.2(L)  Chloride 98 - 111 mmol/L 107 104 106  CO2 22 - 32 mmol/L _0 Calcium 8.9 - 10.3 mg/dL 9.3 9.4 9.2  Total Protein 6.5 - 8.1 g/dL 6.7 6.7 6.9  Total Bilirubin 0.3 - 1.2 mg/dL 0.3 0.3 0.3  Alkaline Phos 38 - 126 U/L 52 49 53  AST 15 - 41 U/L _1 ALT 0 - 44 U/L _2 RADIOGRAPHIC STUDIES: No results found.   ASSESSMENT & PLAN Elizabeth Mayer 60 y.o. female with medical history significant for metastatic adenocarcinoma of the lung who presents for follow up.    After review the labs, the records, discussion with the patient the findings are most  consistent with metastatic adenocarcinoma the lung without any targetable mutations.  The patient has been started on carboplatin pemetrexed pembrolizumab every 3 weeks with Dr. Julien Nordmann, however the patient has expressed a desire to transition to another provider.  As such we were happy to take up her care here.  She already discontinued the carboplatin and is on maintenance pemetrexed/pembrolizumab therapy. We assumed her care with Cycle 6 on 04/04/2020.  Patient returns today for a follow up due to progressive fatigue, mid back pain and episodes of dizziness/presyncope. Labs from today were reviewed without any intervention needed. UA was unremarkable for UTI. Repeat CT imaging did not show progressive disease. Brain MRI delayed as patient had trouble finding a ride.   # Metastatic Adenocarcinoma of the Lung #Brain Metastasis --Most recent scan from 09/12/2020 showed the patient is having an excellent response to treatment with Carbo/Pem/Pem chemotherapy.   --no targetable mutations noted on prior workup. --continue to follow with Dr. Laqueta Due Onc for metastatic spread to the brain.  --can consider docetaxel/ramucirumab at time of progression.  Plan: --CT scan due in Sept 2022 --today is Cycle 14 Day 1 of maintenance Pem/Pem --monitor CMP, CBC, and TSH with each treatment --RTC in 3 weeks for Cycle 15 of chemotherapy  # Fatigue: --Uncertain etiology but differentials include chemotherapy/immunotherapy side effects and progressive disease.  --Monitor closely  # Mid back pain: --Evaluate further with upcoming  CT scan.   Orders Placed This Encounter  Procedures   CT CHEST ABDOMEN PELVIS W CONTRAST    Standing Status:   Future    Standing Expiration Date:   11/29/2021    Order Specific Question:   If indicated for the ordered procedure, I authorize the administration of contrast media per Radiology protocol    Answer:   Yes    Order Specific Question:   Preferred imaging location?     Answer:   [facility]    Order Specific Question:   Is Oral Contrast requested for this exam?    Answer:   Yes, Per Radiology protocol    Order Specific Question:   Reason for Exam (SYMPTOM  OR DIAGNOSIS REQUIRED)    Answer:   metastatic lung cancer, assess treatment response     All questions were answered. The patient knows to call the clinic with any problems, questions or concerns.  I have spent a total of 30 minutes minutes of face-to-face and non-face-to-face time, preparing to see the patient, obtaining and/or reviewing separately obtained history, performing a medically appropriate examination, counseling and educating the patient, ordering tests, documenting clinical information in the electronic health record and care coordination.   Ledell Peoples, MD Department of Hematology/Oncology [place] [facility] Phone: [phone] Pager: [phone] Email: [email or web address]   12/10/2020 9:30 AM

## 2020-12-10 ENCOUNTER — Encounter: Payer: Self-pay | Admitting: Physician Assistant

## 2020-12-10 ENCOUNTER — Encounter: Payer: Self-pay | Admitting: Internal Medicine

## 2020-12-13 ENCOUNTER — Encounter: Payer: Self-pay | Admitting: Internal Medicine

## 2020-12-13 ENCOUNTER — Telehealth: Payer: Self-pay | Admitting: *Deleted

## 2020-12-13 ENCOUNTER — Encounter: Payer: Self-pay | Admitting: Physician Assistant

## 2020-12-13 NOTE — Telephone Encounter (Signed)
Received call from pt.  She states she now has her medical insurance in place and would like to get her chemotherapy scheduled. Advised I would send scheduling message.  High priority scheduling message sent for appt on 12/20/20

## 2020-12-16 ENCOUNTER — Ambulatory Visit (HOSPITAL_COMMUNITY)
Admission: RE | Admit: 2020-12-16 | Discharge: 2020-12-16 | Disposition: A | Discharge status: Home / Self Care | Payer: Medicaid Other | Source: Ambulatory Visit | Attending: Hematology and Oncology | Admitting: Hematology and Oncology

## 2020-12-16 ENCOUNTER — Other Ambulatory Visit: Payer: Self-pay

## 2020-12-16 ENCOUNTER — Encounter (HOSPITAL_COMMUNITY): Payer: Self-pay

## 2020-12-16 DIAGNOSIS — C3491 Malignant neoplasm of unspecified part of right bronchus or lung: Secondary | ICD-10-CM | POA: Insufficient documentation

## 2020-12-16 MED ORDER — HEPARIN SOD (PORK) LOCK FLUSH 100 UNIT/ML IV SOLN
INTRAVENOUS | Status: AC
  Filled 2020-12-16: qty 5

## 2020-12-16 MED ORDER — HEPARIN SOD (PORK) LOCK FLUSH 100 UNIT/ML IV SOLN
500.0000 [IU] | Freq: Once | INTRAVENOUS | Status: AC
  Administered 2020-12-16: 500 [IU] via INTRAVENOUS

## 2020-12-16 MED ORDER — IOHEXOL 350 MG/ML SOLN
75.0000 mL | Freq: Once | INTRAVENOUS | Status: AC | PRN
  Administered 2020-12-16: 75 mL via INTRAVENOUS

## 2020-12-17 ENCOUNTER — Telehealth: Payer: Self-pay | Admitting: *Deleted

## 2020-12-17 NOTE — Telephone Encounter (Signed)
12/16/2020:  Connected with Brenleigh Lavell Luster regarding  new [employer] Disability form brought in with April 2022 copy completed April 2022 per Dr. Lorenso Courier with letter indicating incomplete.  Asked if she submitted billing information or communicated with (SW) H.I.M.     "I signed release last April for everything to be sent.however [employer] did not receive any information from Dr. Julien Nordmann who I saw before Dr. Lorenso Courier.  Brought in what I received from [facility] which was only page 3 of form."    New form in queue to complete for Dr. Julien Nordmann.  Will call pt. for pick-up.    Advised she will need supportive medical records and billing information [UB04 (hospital bill) or  HCFA 1500 (non-hospital bill)] generally expected to accompany physician statement as a whole for review to validate claim.   No authorization noted in EMR however authorization for disclosure/ROI expire within 90-days.  Requested January 2020 through January 2021 records.  Provided billing number [phone] to request records or [facility]  may accept EOB statements.    "I may be able to come to office tomorrow to sign ROI at entry registration desk."

## 2020-12-17 NOTE — Telephone Encounter (Signed)
Connected with Rexene Edison [phone] to advise regarding physician statement and records from 04/25/2018 through 04/26/2019.  Initial new patient visit on 11/08/2019 is beyond date range.   Sky reports above information sent to auditor.  Requests provider letter indicating no supporting documents or medical records exist for dates requested.  03/22/2020 was the initial visit with Dr. Lorenso Courier.  Received attending from Dr. Lorenso Courier, no new form or information needed per [facility].

## 2020-12-18 ENCOUNTER — Encounter: Payer: Self-pay | Admitting: Medical Oncology

## 2020-12-18 ENCOUNTER — Encounter: Payer: Self-pay | Admitting: Physician Assistant

## 2020-12-18 ENCOUNTER — Encounter: Payer: Self-pay | Admitting: Internal Medicine

## 2020-12-19 ENCOUNTER — Other Ambulatory Visit: Payer: Self-pay

## 2020-12-19 ENCOUNTER — Ambulatory Visit: Payer: Self-pay | Admitting: Hematology and Oncology

## 2020-12-19 NOTE — Progress Notes (Signed)
..  Patient Assist/Replace for the following has been terminated. Medication: Keytruda (pembrolizumab) Reason for Termination: Medicaid starting 12/05/2020 Last DOS: 11/29/2020. Marland KitchenJuan Quam, CPhT IV Drug Replacement Specialist [place] Phone: [phone]

## 2020-12-19 NOTE — Progress Notes (Signed)
..  Patient Assist/Replace for the following has been terminated. Medication: Alimta (pemetrexed) Reason for Termination: Medicaid starting 12/05/2020. Last DOS: 11/29/2020. Marland KitchenJuan Quam, CPhT IV Drug Replacement Specialist [place] Phone: [phone]

## 2020-12-20 ENCOUNTER — Ambulatory Visit: Payer: Medicaid Other

## 2020-12-20 ENCOUNTER — Ambulatory Visit: Payer: Self-pay | Admitting: Hematology and Oncology

## 2020-12-20 ENCOUNTER — Other Ambulatory Visit: Payer: Self-pay

## 2020-12-23 ENCOUNTER — Encounter: Payer: Self-pay | Admitting: Physician Assistant

## 2020-12-23 ENCOUNTER — Encounter: Payer: Self-pay | Admitting: Internal Medicine

## 2020-12-25 ENCOUNTER — Encounter: Payer: Self-pay | Admitting: Physician Assistant

## 2020-12-25 ENCOUNTER — Encounter: Payer: Self-pay | Admitting: Internal Medicine

## 2020-12-26 ENCOUNTER — Other Ambulatory Visit: Payer: Self-pay

## 2020-12-26 ENCOUNTER — Ambulatory Visit
Admission: RE | Admit: 2020-12-26 | Discharge: 2020-12-26 | Disposition: A | Discharge status: Home / Self Care | Payer: Medicaid Other | Source: Ambulatory Visit | Attending: Nurse Practitioner | Admitting: Nurse Practitioner

## 2020-12-26 DIAGNOSIS — Z1231 Encounter for screening mammogram for malignant neoplasm of breast: Secondary | ICD-10-CM

## 2020-12-27 ENCOUNTER — Inpatient Hospital Stay: Payer: Medicaid Other

## 2020-12-27 ENCOUNTER — Inpatient Hospital Stay: Payer: Medicaid Other | Attending: Internal Medicine | Admitting: Hematology and Oncology

## 2020-12-27 VITALS — BP 165/79 | HR 87 | Temp 97.2°F | Resp 18 | Ht 64.4 in | Wt 147.1 lb

## 2020-12-27 DIAGNOSIS — Z95828 Presence of other vascular implants and grafts: Secondary | ICD-10-CM | POA: Diagnosis not present

## 2020-12-27 DIAGNOSIS — D6481 Anemia due to antineoplastic chemotherapy: Secondary | ICD-10-CM | POA: Diagnosis not present

## 2020-12-27 DIAGNOSIS — C7931 Secondary malignant neoplasm of brain: Secondary | ICD-10-CM

## 2020-12-27 DIAGNOSIS — C3491 Malignant neoplasm of unspecified part of right bronchus or lung: Secondary | ICD-10-CM | POA: Diagnosis not present

## 2020-12-27 DIAGNOSIS — Z5111 Encounter for antineoplastic chemotherapy: Secondary | ICD-10-CM | POA: Diagnosis not present

## 2020-12-27 DIAGNOSIS — M549 Dorsalgia, unspecified: Secondary | ICD-10-CM | POA: Diagnosis not present

## 2020-12-27 DIAGNOSIS — Z5112 Encounter for antineoplastic immunotherapy: Secondary | ICD-10-CM | POA: Diagnosis present

## 2020-12-27 DIAGNOSIS — Z87891 Personal history of nicotine dependence: Secondary | ICD-10-CM | POA: Diagnosis not present

## 2020-12-27 DIAGNOSIS — R5383 Other fatigue: Secondary | ICD-10-CM | POA: Diagnosis not present

## 2020-12-27 DIAGNOSIS — C778 Secondary and unspecified malignant neoplasm of lymph nodes of multiple regions: Secondary | ICD-10-CM | POA: Insufficient documentation

## 2020-12-27 LAB — CBC WITH DIFFERENTIAL (CANCER CENTER ONLY)
Abs Immature Granulocytes: 0.02 10*3/uL (ref 0.00–0.07)
Basophils Absolute: 0.1 10*3/uL (ref 0.0–0.1)
Basophils Relative: 1 %
Eosinophils Absolute: 0.1 10*3/uL (ref 0.0–0.5)
Eosinophils Relative: 3 %
HCT: 31.8 % — ABNORMAL LOW (ref 36.0–46.0)
Hemoglobin: 10.5 g/dL — ABNORMAL LOW (ref 12.0–15.0)
Immature Granulocytes: 1 %
Lymphocytes Relative: 20 %
Lymphs Abs: 0.8 10*3/uL (ref 0.7–4.0)
MCH: 30.3 pg (ref 26.0–34.0)
MCHC: 33 g/dL (ref 30.0–36.0)
MCV: 91.9 fL (ref 80.0–100.0)
Monocytes Absolute: 0.5 10*3/uL (ref 0.1–1.0)
Monocytes Relative: 12 %
Neutro Abs: 2.6 10*3/uL (ref 1.7–7.7)
Neutrophils Relative %: 63 %
Platelet Count: 156 10*3/uL (ref 150–400)
RBC: 3.46 MIL/uL — ABNORMAL LOW (ref 3.87–5.11)
RDW: 13.9 % (ref 11.5–15.5)
WBC Count: 4 10*3/uL (ref 4.0–10.5)
nRBC: 0 % (ref 0.0–0.2)

## 2020-12-27 LAB — CMP (CANCER CENTER ONLY)
ALT: 14 U/L (ref 0–44)
AST: 29 U/L (ref 15–41)
Albumin: 3.6 g/dL (ref 3.5–5.0)
Alkaline Phosphatase: 51 U/L (ref 38–126)
Anion gap: 10 (ref 5–15)
BUN: 11 mg/dL (ref 8–23)
CO2: 28 mmol/L (ref 22–32)
Calcium: 9.4 mg/dL (ref 8.9–10.3)
Chloride: 106 mmol/L (ref 98–111)
Creatinine: 1.07 mg/dL — ABNORMAL HIGH (ref 0.44–1.00)
GFR, Estimated: 59 mL/min — ABNORMAL LOW (ref >60)
Glucose, Bld: 97 mg/dL (ref 70–99)
Potassium: 4 mmol/L (ref 3.5–5.1)
Sodium: 144 mmol/L (ref 135–145)
Total Bilirubin: 0.3 mg/dL (ref 0.3–1.2)
Total Protein: 7.1 g/dL (ref 6.5–8.1)

## 2020-12-27 LAB — MAGNESIUM: Magnesium: 1.7 mg/dL (ref 1.7–2.4)

## 2020-12-27 LAB — TSH: TSH: 1.3 u[IU]/mL (ref 0.308–3.960)

## 2020-12-27 MED ORDER — SODIUM CHLORIDE 0.9 % IV SOLN
200.0000 mg | Freq: Once | INTRAVENOUS | Status: AC
  Administered 2020-12-27: 200 mg via INTRAVENOUS
  Filled 2020-12-27: qty 8

## 2020-12-27 MED ORDER — SODIUM CHLORIDE 0.9 % IV SOLN: Freq: Once | INTRAVENOUS | Status: AC

## 2020-12-27 MED ORDER — HEPARIN SOD (PORK) LOCK FLUSH 100 UNIT/ML IV SOLN
500.0000 [IU] | Freq: Once | INTRAVENOUS | Status: AC | PRN
  Administered 2020-12-27: 500 [IU]

## 2020-12-27 MED ORDER — SODIUM CHLORIDE 0.9% FLUSH: 10.0000 mL | Freq: Once | INTRAVENOUS | Status: DC

## 2020-12-27 MED ORDER — PROCHLORPERAZINE MALEATE 10 MG PO TABS
10.0000 mg | ORAL_TABLET | Freq: Once | ORAL | Status: AC
  Administered 2020-12-27: 10 mg via ORAL
  Filled 2020-12-27: qty 1

## 2020-12-27 MED ORDER — SODIUM CHLORIDE 0.9% FLUSH
10.0000 mL | INTRAVENOUS | Status: DC | PRN
  Administered 2020-12-27: 10 mL

## 2020-12-27 MED ORDER — SODIUM CHLORIDE 0.9 % IV SOLN
500.0000 mg/m2 | Freq: Once | INTRAVENOUS | Status: AC
  Administered 2020-12-27: 900 mg via INTRAVENOUS
  Filled 2020-12-27: qty 20

## 2020-12-27 NOTE — Progress Notes (Signed)
[facility] Telephone:([phone]) [phone]   Fax:([phone]) [phone]  PROGRESS NOTE  Patient Care Team: Minette Brine, FNP as PCP - General (General Practice)  Hematological/Oncological History # Metastatic Adenocarcinoma of the Lung #Brain Metastasis 1) 11/08/2019: establish care with Dr. Julien Nordmann and his PA Cassie Heilingoetter. Noted to have widely metastatic lung cancer with involvement of the brain, lymph nodes, and right lung.  2) 12/04/2019: palliative radiation to the right lung mass/cervical adenopathy 3) 9/7/02021: started therapy with Carbo/Pem/Pem 4) 9/29-10/09/2019: patient underwent SRS to the brain mets 5) 03/22/2020: repeat CT C/A/P and neck scheduled. Transfer care to Dr. Lorenso Courier.   6) 03/22/2020: CT C/A/P showed interval significant improvement in previously demonstrated extensive confluent lymphadenopathy in the superior mediastinum and right hilar regions. 7) 08/15/2020: Holding chemotherapy due to poor Hgb and fatigue. Allowing patient a brief 'chemo holiday' to rebound appropriately from last cycle.  8) 08/29/2020: restarted chemotherapy, Cycle 12 Day 1 9) 09/19/2020: patient requested to HOLD Cycle 13 in setting of fatigue/weakness.  10) 11/29/2020: Cycle 14 Day 1 of chemotherapy, delayed start due to insurance issues.   Interval History:  Elizabeth Mayer 60 y.o. female with medical history significant for metastatic adenocarcinoma of the lung who presents for follow up. The patient's last visit was on 11/29/2020 when she received Cycle 14, Day 1 of pem/pem maintenance.  In the interim since her last visit she underwent a CT scan of the chest abdomen pelvis on 12/16/2020 which showed no evidence of progression.  On exam today Elizabeth Mayer reports she had an allergic reaction with her last CT scan.  She notes that she had feelings of lightheadedness and dizziness shortly after the CT scan.  She notes that these are similar feelings when she has been receiving chemotherapy.  She  notes that her appetite has been so-so and that it comes to food she has to "force it".  She notes that she has to eat and has been quite successful increasing her weight by 5 pounds.  She fortunately not having any nausea, ming, or diarrhea.  She continues to take Tylenol for her pain and tries to avoid the hydrocodone because it causes constipation.  Overall she notes that she has been tolerating chemotherapy well and did not have any major side effects after her last treatment.  She is on 3 L of oxygen and notes that this level has been stable.  She denies any fevers, chills, night sweats, chest pain or cough. She has no other complaints.  A full 10 point ROS is listed below.  MEDICAL HISTORY:  Past Medical History:  Diagnosis Date   Anemia    Angina 1982   related to stress   Asthma    in the past,  no current problems   Complication of anesthesia    states anesthesia made her hair fall out   Constipation    Dyspnea    on oxygen at home - 2L via [place]   Fatigue    GERD (gastroesophageal reflux disease)    patient denies this dx   Headache    Heart murmur 1970s   no problems currently   Ingrown toenail    Lung cancer ([place]) 10/2019   metastatic disease to the brain   On home oxygen therapy    2.2 lpm, 24 hours a day   Past heart attack 1980-1981   pt states she passed out and woke up in hospital- told she had heart attack, but then dr said he couldn't find  anything wrong.   Pneumonia    x 1    SURGICAL HISTORY: Past Surgical History:  Procedure Laterality Date   CERVICAL DISC SURGERY  2000   Disc removed from neck    ingrown toe nail surgery Bilateral    IR IMAGING GUIDED PORT INSERTION  02/16/2020   MULTIPLE TOOTH EXTRACTIONS     for braces   RADIOLOGY WITH ANESTHESIA N/A 12/05/2019   Procedure: MRI BRAIN WITH AND WITHOUT CONTRAST;  Surgeon: Radiologist, Medication, MD;  Location: [facility];  Service: Radiology;  Laterality: N/A;   RADIOLOGY WITH ANESTHESIA N/A 01/02/2020    Procedure: MRI BRAIN WITH AND WITHOUT CONTRAST;  Surgeon: Radiologist, Medication, MD;  Location: [facility];  Service: Radiology;  Laterality: N/A;   RADIOLOGY WITH ANESTHESIA N/A 02/20/2020   Procedure: MRI WITH ANESTHESIA OF BRAIN WITH AND WITHOUT CONTRAST;  Surgeon: Radiologist, Medication, MD;  Location: [facility];  Service: Radiology;  Laterality: N/A;   RADIOLOGY WITH ANESTHESIA N/A 06/11/2020   Procedure: MRI WITH ANESTHESIA OF BRAIN WITH AND WITHOUT CONTRAST;  Surgeon: Radiologist, Medication, MD;  Location: [facility];  Service: Radiology;  Laterality: N/A;   RADIOLOGY WITH ANESTHESIA N/A 10/10/2020   Procedure: MRI WITH ANESTHESIA BRAIN WITH AND WITHOUT CONTRAST;  Surgeon: Radiologist, Medication, MD;  Location: [facility];  Service: Radiology;  Laterality: N/A;   TONSILLECTOMY      SOCIAL HISTORY: Social History   Socioeconomic History   Marital status: Legally Separated    Spouse name: Not on file   Number of children: 0   Years of education: Not on file   Highest education level: Not on file  Occupational History   Occupation: Secondary school teacher  Tobacco Use   Smoking status: Former    Packs/day: 1.00    Years: 20.00    Pack years: 20.00    Types: Cigarettes    Start date: 20    Quit date: 2001    Years since quitting: 21.7   Smokeless tobacco: Never  Vaping Use   Vaping Use: Never used  Substance and Sexual Activity   Alcohol use: No   Drug use: No   Sexual activity: Yes    Birth control/protection: None, Post-menopausal  Other Topics Concern   Not on file  Social History Narrative   Not on file   Social Determinants of Health   Financial Resource Strain: Not on file  Food Insecurity: Not on file  Transportation Needs: Not on file  Physical Activity: Not on file  Stress: Not on file  Social Connections: Not on file  Intimate Partner Violence: Not on file    FAMILY HISTORY: Family History  Problem Relation Age of Onset   Arthritis Mother    Heart failure Father      ALLERGIES:  is allergic to pseudoephedrine, gabapentin, mobic [meloxicam], and penicillins.  MEDICATIONS:  Current Outpatient Medications  Medication Sig Dispense Refill   acetaminophen (TYLENOL) 500 MG tablet Take 1,000 mg by mouth every 6 (six) hours as needed for moderate pain.      albuterol (VENTOLIN HFA) 108 (90 Base) MCG/ACT inhaler Inhale 2 puffs into the lungs every 6 (six) hours as needed for wheezing or shortness of breath. (Patient taking differently: Inhale 2 puffs into the lungs 2 (two) times daily. Additional 2 puff's of needed) 18 g 6   benzoyl peroxide (CERAVE ACNE FOAMING CREAM) 4 % external liquid Apply 1 application topically daily. Face     budesonide-formoterol (SYMBICORT) 80-4.5 MCG/ACT inhaler Inhale 2 puffs into the  lungs in the morning and at bedtime. 1 each 11   camphor-menthol (SARNA) lotion Apply 1 application topically daily as needed for itching.     Carboxymethylcellulose Sodium (REFRESH CELLUVISC OP) Place 2 drops into both eyes See admin instructions. Instill 2  drop into both eyes twice daily (scheduled) & then use as needed throughout the day for dry/irritated eyes     clindamycin (CLEOCIN T) 1 % lotion Apply 1 application topically daily.     Cyanocobalamin (B-12 COMPLIANCE INJECTION) 1000 MCG/ML KIT Inject 1,000 mcg as directed See admin instructions. As needed give at Chemo     fexofenadine (ALLEGRA) 180 MG tablet Take 180 mg by mouth in the morning.     fluticasone (FLONASE) 50 MCG/ACT nasal spray Place 1 spray into both nostrils daily.     folic acid (FOLVITE) 1 MG tablet Take 1 tablet (1 mg total) by mouth daily. (Patient taking differently: Take 1 mg by mouth in the morning.) 90 tablet 2   guaiFENesin (MUCINEX) 600 MG 12 hr tablet Take 600 mg by mouth 2 (two) times daily.      HYDROcodone-acetaminophen (HYCET) 7.5-325 mg/15 ml solution Take 10 mLs by mouth 2 (two) times daily. For pain from cancer and radiation reactions (Patient taking differently:  Take 10 mLs by mouth 2 (two) times daily as needed for moderate pain or severe pain. For pain from cancer and radiation reactions) 473 mL 0   lidocaine (XYLOCAINE) 2 % solution Use as directed 15 mLs in the mouth or throat as needed for mouth pain. Swallow 30 min prior to meals and at bedtime for throat/esophagus pain 250 mL 1   lidocaine-prilocaine (EMLA) cream Apply 1 application topically as needed. (Patient taking differently: Apply 1 application topically as needed (port access).) 30 g 2   LORazepam (ATIVAN) 0.5 MG tablet Please take 1 tablet 30 minutes before your scan as needed for anxiety (Patient taking differently: Take 0.5 mg by mouth See admin instructions. Please take 1 tablet 30 minutes before your scan as needed for anxiety) 2 tablet 0   Multiple Vitamin (MULTIVITAMIN WITH MINERALS) TABS tablet Take 1 tablet by mouth daily. Alive     OXYGEN Inhale 3 L/min into the lungs continuous.     potassium chloride 20 MEQ/15ML (10%) SOLN Take 15 mLs (20 mEq total) by mouth 2 (two) times daily. 210 mL 0   prochlorperazine (COMPAZINE) 10 MG tablet Take 1 tablet (10 mg total) by mouth every 6 (six) hours as needed. (Patient taking differently: Take 10 mg by mouth every 6 (six) hours as needed (nausea with chemo).) 30 tablet 2   Soap & Cleansers (MEDERMA AG FACIAL TONER) LIQD Apply 1 application topically daily.     triamcinolone ointment (KENALOG) 0.1 % Apply 1 application topically in the morning and at bedtime.     No current facility-administered medications for this visit.    REVIEW OF SYSTEMS:   Constitutional: ( - ) fevers, ( - )  chills , ( - ) night sweats Eyes: ( - ) blurriness of vision, ( - ) double vision, ( - ) watery eyes Ears, nose, mouth, throat, and face: ( - ) mucositis, ( - ) sore throat Respiratory: ( - ) cough, ( - ) dyspnea, ( - ) wheezes Cardiovascular: ( - ) palpitation, ( - ) chest discomfort, ( - ) lower extremity swelling Gastrointestinal:  ( - ) nausea, ( - )  heartburn, ( - ) change in bowel habits Skin: ( - )  abnormal skin rashes Lymphatics: ( - ) new lymphadenopathy, ( - ) easy bruising Neurological: ( - ) numbness, ( - ) tingling, ( - ) new weaknesses Behavioral/Psych: ( - ) mood change, ( - ) new changes  All other systems were reviewed with the patient and are negative.  PHYSICAL EXAMINATION: ECOG PERFORMANCE STATUS: 3 - Symptomatic, >50% confined to bed  Vitals:   12/27/20 0944  BP: (!) 165/79  Pulse: 87  Resp: 18  Temp: (!) 97.2 F (36.2 C)  SpO2: 100%   Filed Weights   12/27/20 0944  Weight: 147 lb 1.6 oz (66.7 kg)    GENERAL: well appearing middle aged [place] American female. alert, no distress and comfortable SKIN: skin color, texture, turgor are normal, no rashes or significant lesions EYES: conjunctiva are pink and non-injected, sclera clear LUNGS: clear to auscultation and percussion with normal breathing effort HEART: regular rate & rhythm and no murmurs and no lower extremity edema Musculoskeletal: no cyanosis of digits and no clubbing.  PSYCH: alert & oriented x 3, fluent speech NEURO: no focal motor/sensory deficits   LABORATORY DATA:  I have reviewed the data as listed CBC Latest Ref Rng & Units 12/27/2020 11/29/2020 10/11/2020  WBC 4.0 - 10.5 K/uL 4.0 4.8 4.9  Hemoglobin 12.0 - 15.0 g/dL 10.5(L) 10.7(L) 10.0(L)  Hematocrit 36.0 - 46.0 % 31.8(L) 33.1(L) 30.2(L)  Platelets 150 - 400 K/uL 156 138(L) 142(L)    CMP Latest Ref Rng & Units 12/27/2020 11/29/2020 10/11/2020  Glucose 70 - 99 mg/dL 97 93 96  BUN 8 - 23 mg/dL _0 Creatinine 0.44 - 1.00 mg/dL 1.07(H) 1.17(H) 1.08(H)  Sodium 135 - 145 mmol/L 144 144 145  Potassium 3.5 - 5.1 mmol/L 4.0 3.8 3.3(L)  Chloride 98 - 111 mmol/L 106 107 104  CO2 22 - 32 mmol/L _1 Calcium 8.9 - 10.3 mg/dL 9.4 9.3 9.4  Total Protein 6.5 - 8.1 g/dL 7.1 6.7 6.7  Total Bilirubin 0.3 - 1.2 mg/dL 0.3 0.3 0.3  Alkaline Phos 38 - 126 U/L 51 52 49  AST 15 - 41 U/L _2 ALT 0 - 44 U/L _3 RADIOGRAPHIC STUDIES: CT CHEST ABDOMEN PELVIS W CONTRAST  Result Date: 12/17/2020 CLINICAL DATA:  Metastatic non-small cell lung cancer restaging, assess treatment response, ongoing chemotherapy and immunotherapy, XRT complete EXAM: CT CHEST, ABDOMEN, AND PELVIS WITH CONTRAST TECHNIQUE: Multidetector CT imaging of the chest, abdomen and pelvis was performed following the standard protocol during bolus administration of intravenous contrast. CONTRAST:  35m OMNIPAQUE IOHEXOL 350 MG/ML SOLN, additional oral enteric contrast COMPARISON:  09/12/2020 FINDINGS: CT CHEST FINDINGS Cardiovascular: Right chest port catheter. Aortic atherosclerosis. Normal heart size. No pericardial effusion. Mediastinum/Nodes: Unchanged post treatment right hilar and pretracheal soft tissue thickening. No discretely enlarged mediastinal, hilar, or axillary lymph nodes. Enlarged left supraclavicular lymph nodes described on prior examination are not well appreciated. Thyroid gland, trachea, and esophagus demonstrate no significant findings. Lungs/Pleura: Mild centrilobular emphysema. Unchanged post treatment/post radiation consolidation and fibrosis of the perihilar right lung. Bandlike scarring of the left lung base. A small focus of consolidation noted in the peripheral superior segment right lower lobe on prior examination is almost completely resolved (series 8, image 64). No pleural effusion or pneumothorax. Musculoskeletal: No chest wall mass or suspicious bone lesions identified. CT ABDOMEN PELVIS FINDINGS Hepatobiliary: No solid liver abnormality is seen. No gallstones, gallbladder wall thickening, or biliary dilatation. Pancreas: Unremarkable. No pancreatic  ductal dilatation or surrounding inflammatory changes. Spleen: Normal in size without significant abnormality. Adrenals/Urinary Tract: Adrenal glands are unremarkable. Kidneys are normal, without renal calculi, solid lesion, or hydronephrosis.  Bladder is unremarkable. Stomach/Bowel: Stomach is within normal limits. Appendix appears normal. No evidence of bowel wall thickening, distention, or inflammatory changes. Vascular/Lymphatic: Aortic atherosclerosis. No enlarged abdominal or pelvic lymph nodes. Reproductive: Uterine fibroids. Other: No abdominal wall hernia or abnormality. No abdominopelvic ascites. Musculoskeletal: No acute or significant osseous findings. IMPRESSION: 1. Unchanged post treatment/post radiation consolidation and fibrosis of the perihilar right lung. 2. Unchanged post treatment right hilar and pretracheal soft tissue thickening. No discretely enlarged mediastinal, hilar, or axillary lymph nodes. Enlarged left supraclavicular lymph nodes described on prior examination are not well appreciated. 3. No evidence of distant metastatic disease in the abdomen or pelvis. 4. A small focus of nodular consolidation noted in the superior segment right lower lobe on prior examination is almost completely resolved, consistent with resolution of nonspecific infection or inflammation. 5. Emphysema. Aortic Atherosclerosis (ICD10-I70.0) and Emphysema (ICD10-J43.9). Electronically Signed   By: Eddie Candle M.D.   On: 12/17/2020 10:53     ASSESSMENT & PLAN Elizabeth Mayer 60 y.o. female with medical history significant for metastatic adenocarcinoma of the lung who presents for follow up.    After review the labs, the records, discussion with the patient the findings are most consistent with metastatic adenocarcinoma the lung without any targetable mutations.  The patient has been started on carboplatin pemetrexed pembrolizumab every 3 weeks with Dr. Julien Nordmann, however the patient has expressed a desire to transition to another provider.  As such we were happy to take up her care here.  She already discontinued the carboplatin and is on maintenance pemetrexed/pembrolizumab therapy. We assumed her care with Cycle 6 on 04/04/2020.  Patient returns  today for a follow up due to progressive fatigue, mid back pain and episodes of dizziness/presyncope. Labs from today were reviewed without any intervention needed. UA was unremarkable for UTI. Repeat CT imaging did not show progressive disease at last check.  # Metastatic Adenocarcinoma of the Lung #Brain Metastasis --Most recent scan from 09/12/2020 showed the patient is having an excellent response to treatment with Carbo/Pem/Pem chemotherapy.   --no targetable mutations noted on prior workup. --continue to follow with Dr. Laqueta Due Onc for metastatic spread to the brain.  --can consider docetaxel/ramucirumab at time of progression.  Plan: --CT scan shows stable disease in Sept 2022. Repeat due Dec 2022 --today is Cycle 15 Day 1 of maintenance Pem/Pem --monitor CMP, CBC, and TSH with each treatment --RTC in 3 weeks for Cycle 16 of chemotherapy  #Chemotherapy Induced Anemia --Hgb 10.5 today, stable from prior  # Fatigue: --Uncertain etiology but likely chemotherapy/immunotherapy side effects  --Monitor closely  # Mid back pain: --no findings of metastasis to spine or structure problems with back.    No orders of the defined types were placed in this encounter.  All questions were answered. The patient knows to call the clinic with any problems, questions or concerns.  I have spent a total of 30 minutes minutes of face-to-face and non-face-to-face time, preparing to see the patient, obtaining and/or reviewing separately obtained history, performing a medically appropriate examination, counseling and educating the patient, ordering tests, documenting clinical information in the electronic health record and care coordination.   Ledell Peoples, MD Department of Hematology/Oncology [facility] Phone: [phone] Pager: [phone] Email: [email or web address]   12/27/2020 10:14 AM

## 2020-12-27 NOTE — Patient Instructions (Signed)
[facility]  Discharge Instructions: Thank you for choosing [facility] to provide your oncology and hematology care.   If you have a lab appointment with the [facility], please go directly to the [facility] and check in at the registration area.   Wear comfortable clothing and clothing appropriate for easy access to any Portacath or PICC line.   We strive to give you quality time with your provider. You may need to reschedule your appointment if you arrive late (15 or more minutes).  Arriving late affects you and other patients whose appointments are after yours.  Also, if you miss three or more appointments without notifying the office, you may be dismissed from the clinic at the provider's discretion.      For prescription refill requests, have your pharmacy contact our office and allow 72 hours for refills to be completed.    Today you received the following chemotherapy and/or immunotherapy agents Keytruda and Alimta      To help prevent nausea and vomiting after your treatment, we encourage you to take your nausea medication as directed.  BELOW ARE SYMPTOMS THAT SHOULD BE REPORTED IMMEDIATELY: *FEVER GREATER THAN 100.4 F (38 C) OR HIGHER *CHILLS OR SWEATING *NAUSEA AND VOMITING THAT IS NOT CONTROLLED WITH YOUR NAUSEA MEDICATION *UNUSUAL SHORTNESS OF BREATH *UNUSUAL BRUISING OR BLEEDING *URINARY PROBLEMS (pain or burning when urinating, or frequent urination) *BOWEL PROBLEMS (unusual diarrhea, constipation, pain near the anus) TENDERNESS IN MOUTH AND THROAT WITH OR WITHOUT PRESENCE OF ULCERS (sore throat, sores in mouth, or a toothache) UNUSUAL RASH, SWELLING OR PAIN  UNUSUAL VAGINAL DISCHARGE OR ITCHING   Items with * indicate a potential emergency and should be followed up as soon as possible or go to the Emergency Department if any problems should occur.  Please show the CHEMOTHERAPY ALERT CARD or IMMUNOTHERAPY ALERT CARD at  check-in to the Emergency Department and triage nurse.  Should you have questions after your visit or need to cancel or reschedule your appointment, please contact [facility]  Dept: [phone]  and follow the prompts.  Office hours are 8:00 a.m. to 4:30 p.m. Monday - Friday. Please note that voicemails left after 4:00 p.m. may not be returned until the following business day.  We are closed weekends and major holidays. You have access to a nurse at all times for urgent questions. Please call the main number to the clinic Dept: [phone] and follow the prompts.   For any non-urgent questions, you may also contact your provider using MyChart. We now offer e-Visits for anyone 5 and older to request care online for non-urgent symptoms. For details visit [email or web address].   Also download the MyChart app! Go to the app store, search "MyChart", open the app, select [facility], and log in with your MyChart username and password.  Due to Covid, a mask is required upon entering the hospital/clinic. If you do not have a mask, one will be given to you upon arrival. For doctor visits, patients may have 1 support person aged 83 or older with them. For treatment visits, patients cannot have anyone with them due to current Covid guidelines and our immunocompromised population.

## 2021-01-02 ENCOUNTER — Encounter: Payer: Self-pay | Admitting: Internal Medicine

## 2021-01-02 ENCOUNTER — Encounter: Payer: Self-pay | Admitting: Physician Assistant

## 2021-01-09 ENCOUNTER — Other Ambulatory Visit: Payer: Self-pay | Admitting: Emergency Medicine

## 2021-01-10 ENCOUNTER — Ambulatory Visit: Payer: Self-pay

## 2021-01-10 ENCOUNTER — Other Ambulatory Visit: Payer: Self-pay

## 2021-01-10 ENCOUNTER — Ambulatory Visit: Payer: Self-pay | Admitting: Hematology and Oncology

## 2021-01-13 ENCOUNTER — Ambulatory Visit: Payer: Self-pay | Admitting: Internal Medicine

## 2021-01-17 ENCOUNTER — Inpatient Hospital Stay: Payer: Medicaid Other

## 2021-01-17 ENCOUNTER — Other Ambulatory Visit: Payer: Self-pay

## 2021-01-17 ENCOUNTER — Inpatient Hospital Stay (HOSPITAL_BASED_OUTPATIENT_CLINIC_OR_DEPARTMENT_OTHER): Payer: Medicaid Other | Admitting: Hematology and Oncology

## 2021-01-17 ENCOUNTER — Inpatient Hospital Stay: Payer: Medicaid Other | Attending: Internal Medicine

## 2021-01-17 VITALS — BP 156/77 | HR 82 | Temp 97.6°F | Resp 18 | Ht 64.4 in | Wt 145.6 lb

## 2021-01-17 DIAGNOSIS — R519 Headache, unspecified: Secondary | ICD-10-CM | POA: Insufficient documentation

## 2021-01-17 DIAGNOSIS — Z5112 Encounter for antineoplastic immunotherapy: Secondary | ICD-10-CM | POA: Insufficient documentation

## 2021-01-17 DIAGNOSIS — C7931 Secondary malignant neoplasm of brain: Secondary | ICD-10-CM

## 2021-01-17 DIAGNOSIS — Z87891 Personal history of nicotine dependence: Secondary | ICD-10-CM | POA: Diagnosis not present

## 2021-01-17 DIAGNOSIS — C3491 Malignant neoplasm of unspecified part of right bronchus or lung: Secondary | ICD-10-CM | POA: Diagnosis not present

## 2021-01-17 DIAGNOSIS — Z95828 Presence of other vascular implants and grafts: Secondary | ICD-10-CM

## 2021-01-17 DIAGNOSIS — Z5111 Encounter for antineoplastic chemotherapy: Secondary | ICD-10-CM | POA: Diagnosis not present

## 2021-01-17 DIAGNOSIS — C779 Secondary and unspecified malignant neoplasm of lymph node, unspecified: Secondary | ICD-10-CM | POA: Diagnosis not present

## 2021-01-17 LAB — TSH: TSH: 1.41 u[IU]/mL (ref 0.308–3.960)

## 2021-01-17 LAB — CBC WITH DIFFERENTIAL (CANCER CENTER ONLY)
Abs Immature Granulocytes: 0.01 K/uL (ref 0.00–0.07)
Basophils Absolute: 0 K/uL (ref 0.0–0.1)
Basophils Relative: 1 %
Eosinophils Absolute: 0.1 K/uL (ref 0.0–0.5)
Eosinophils Relative: 3 %
HCT: 30.6 % — ABNORMAL LOW (ref 36.0–46.0)
Hemoglobin: 10 g/dL — ABNORMAL LOW (ref 12.0–15.0)
Immature Granulocytes: 0 %
Lymphocytes Relative: 23 %
Lymphs Abs: 1 K/uL (ref 0.7–4.0)
MCH: 30.1 pg (ref 26.0–34.0)
MCHC: 32.7 g/dL (ref 30.0–36.0)
MCV: 92.2 fL (ref 80.0–100.0)
Monocytes Absolute: 0.5 K/uL (ref 0.1–1.0)
Monocytes Relative: 11 %
Neutro Abs: 2.7 K/uL (ref 1.7–7.7)
Neutrophils Relative %: 62 %
Platelet Count: 164 K/uL (ref 150–400)
RBC: 3.32 MIL/uL — ABNORMAL LOW (ref 3.87–5.11)
RDW: 14.2 % (ref 11.5–15.5)
WBC Count: 4.4 K/uL (ref 4.0–10.5)
nRBC: 0 % (ref 0.0–0.2)

## 2021-01-17 LAB — CMP (CANCER CENTER ONLY)
ALT: 13 U/L (ref 0–44)
AST: 25 U/L (ref 15–41)
Albumin: 3.6 g/dL (ref 3.5–5.0)
Alkaline Phosphatase: 44 U/L (ref 38–126)
Anion gap: 7 (ref 5–15)
BUN: 15 mg/dL (ref 8–23)
CO2: 28 mmol/L (ref 22–32)
Calcium: 8.9 mg/dL (ref 8.9–10.3)
Chloride: 104 mmol/L (ref 98–111)
Creatinine: 1.1 mg/dL — ABNORMAL HIGH (ref 0.44–1.00)
GFR, Estimated: 57 mL/min — ABNORMAL LOW
Glucose, Bld: 92 mg/dL (ref 70–99)
Potassium: 3.8 mmol/L (ref 3.5–5.1)
Sodium: 139 mmol/L (ref 135–145)
Total Bilirubin: 0.3 mg/dL (ref 0.3–1.2)
Total Protein: 6.9 g/dL (ref 6.5–8.1)

## 2021-01-17 LAB — MAGNESIUM: Magnesium: 1.9 mg/dL (ref 1.7–2.4)

## 2021-01-17 MED ORDER — SODIUM CHLORIDE 0.9 % IV SOLN: Freq: Once | INTRAVENOUS | Status: AC

## 2021-01-17 MED ORDER — SODIUM CHLORIDE 0.9% FLUSH
10.0000 mL | INTRAVENOUS | Status: DC | PRN
Start: 2021-01-17 — End: 2021-01-17
  Administered 2021-01-17: 10 mL

## 2021-01-17 MED ORDER — SODIUM CHLORIDE 0.9% FLUSH
10.0000 mL | Freq: Once | INTRAVENOUS | Status: AC
  Administered 2021-01-17: 10 mL

## 2021-01-17 MED ORDER — PROCHLORPERAZINE MALEATE 10 MG PO TABS
10.0000 mg | ORAL_TABLET | Freq: Once | ORAL | Status: AC
  Administered 2021-01-17: 10 mg via ORAL
  Filled 2021-01-17: qty 1

## 2021-01-17 MED ORDER — SODIUM CHLORIDE 0.9 % IV SOLN
200.0000 mg | Freq: Once | INTRAVENOUS | Status: AC
  Administered 2021-01-17: 200 mg via INTRAVENOUS
  Filled 2021-01-17: qty 8

## 2021-01-17 MED ORDER — HEPARIN SOD (PORK) LOCK FLUSH 100 UNIT/ML IV SOLN
500.0000 [IU] | Freq: Once | INTRAVENOUS | Status: AC | PRN
  Administered 2021-01-17: 500 [IU]

## 2021-01-17 MED ORDER — ALTEPLASE 2 MG IJ SOLR
2.0000 mg | Freq: Once | INTRAMUSCULAR | Status: DC | PRN
  Filled 2021-01-17: qty 2

## 2021-01-17 MED ORDER — SODIUM CHLORIDE 0.9 % IV SOLN
500.0000 mg/m2 | Freq: Once | INTRAVENOUS | Status: AC
  Administered 2021-01-17: 900 mg via INTRAVENOUS
  Filled 2021-01-17: qty 20

## 2021-01-17 NOTE — Progress Notes (Signed)
[place] [phone]   [phone]  PROGRESS NOTE  Patient Care Team: Minette Brine, FNP as PCP - General (General Practice)  Hematological/Oncological History # Metastatic Adenocarcinoma of the Lung #Brain Metastasis 1) 11/08/2019: establish care with Dr. Julien Nordmann and his PA Cassie Heilingoetter. Noted to have widely metastatic lung cancer with involvement of the brain, lymph nodes, and right lung.  2) 12/04/2019: palliative radiation to the right lung mass/cervical adenopathy 3) 9/7/02021: started therapy with Carbo/Pem/Pem 4) 9/29-10/09/2019: patient underwent SRS to the brain mets 5) 03/22/2020: repeat CT C/A/P and neck scheduled. Transfer care to Dr. Lorenso Courier.   6) 03/22/2020: CT C/A/P showed interval significant improvement in previously demonstrated extensive confluent lymphadenopathy in the superior mediastinum and right hilar regions. 7) 08/15/2020: Holding chemotherapy due to poor Hgb and fatigue. Allowing patient a brief 'chemo holiday' to rebound appropriately from last cycle.  8) 08/29/2020: restarted chemotherapy, Cycle 12 Day 1 9) 09/19/2020: patient requested to HOLD Cycle 13 in setting of fatigue/weakness.  10) 11/29/2020: Cycle 14 Day 1 of chemotherapy, delayed start due to insurance issues.   Interval History:  Karlissa A Wehrenberg 60 y.o. female with medical history significant for metastatic adenocarcinoma of the lung who presents for follow up. The patient's last visit was on 12/27/2020 when she received Cycle 15, Day 1 of pem/pem maintenance.  In the interim since her last visit she has had no other changed in her health.   On exam today Mrs. Schrecengost reports she feels well overall.  She notes that she has been doing her best to try to wean herself off of her oxygen.  She continues to use it at night but tries to use it sporadically throughout the day.  Overall she reports that she feels better than her last visit.  She does still have occasional  episodes of dizziness and feeling off balance, as well as neuropathy in the feet and rarely the hands.  She denies any nausea, vomiting, or diarrhea.  Overall she notes that she has been tolerating chemotherapy well and did not have any major side effects after her last treatment.   She denies any fevers, chills, night sweats, chest pain or cough. She has no other complaints.  A full 10 point ROS is listed below.  MEDICAL HISTORY:  Past Medical History:  Diagnosis Date   Anemia    Angina 1982   related to stress   Asthma    in the past,  no current problems   Complication of anesthesia    states anesthesia made her hair fall out   Constipation    Dyspnea    on oxygen at home - 2L via [place]   Fatigue    GERD (gastroesophageal reflux disease)    patient denies this dx   Headache    Heart murmur 1970s   no problems currently   Ingrown toenail    Lung cancer ([place]) 10/2019   metastatic disease to the brain   On home oxygen therapy    2.2 lpm, 24 hours a day   Past heart attack 1980-1981   pt states she passed out and woke up in hospital- told she had heart attack, but then dr said he couldn't find anything wrong.   Pneumonia    x 1    SURGICAL HISTORY: Past Surgical History:  Procedure Laterality Date   CERVICAL DISC SURGERY  2000   Disc removed from neck    ingrown toe nail surgery Bilateral    IR  IMAGING GUIDED PORT INSERTION  02/16/2020   MULTIPLE TOOTH EXTRACTIONS     for braces   RADIOLOGY WITH ANESTHESIA N/A 12/05/2019   Procedure: MRI BRAIN WITH AND WITHOUT CONTRAST;  Surgeon: Radiologist, Medication, MD;  Location: [place];  Service: Radiology;  Laterality: N/A;   RADIOLOGY WITH ANESTHESIA N/A 01/02/2020   Procedure: MRI BRAIN WITH AND WITHOUT CONTRAST;  Surgeon: Radiologist, Medication, MD;  Location: [facility];  Service: Radiology;  Laterality: N/A;   RADIOLOGY WITH ANESTHESIA N/A 02/20/2020   Procedure: MRI WITH ANESTHESIA OF BRAIN WITH AND WITHOUT CONTRAST;  Surgeon:  Radiologist, Medication, MD;  Location: [facility] [facility];  Service: Radiology;  Laterality: N/A;   RADIOLOGY WITH ANESTHESIA N/A 06/11/2020   Procedure: MRI WITH ANESTHESIA OF BRAIN WITH AND WITHOUT CONTRAST;  Surgeon: Radiologist, Medication, MD;  Location: [facility];  Service: Radiology;  Laterality: N/A;   RADIOLOGY WITH ANESTHESIA N/A 10/10/2020   Procedure: MRI WITH ANESTHESIA BRAIN WITH AND WITHOUT CONTRAST;  Surgeon: Radiologist, Medication, MD;  Location: [facility];  Service: Radiology;  Laterality: N/A;   TONSILLECTOMY      SOCIAL HISTORY: Social History   Socioeconomic History   Marital status: Legally Separated    Spouse name: Not on file   Number of children: 0   Years of education: Not on file   Highest education level: Not on file  Occupational History   Occupation: Secondary school teacher  Tobacco Use   Smoking status: Former    Packs/day: 1.00    Years: 20.00    Pack years: 20.00    Types: Cigarettes    Start date: 38    Quit date: 2001    Years since quitting: 21.7   Smokeless tobacco: Never  Vaping Use   Vaping Use: Never used  Substance and Sexual Activity   Alcohol use: No   Drug use: No   Sexual activity: Yes    Birth control/protection: None, Post-menopausal  Other Topics Concern   Not on file  Social History Narrative   Not on file   Social Determinants of Health   Financial Resource Strain: Not on file  Food Insecurity: Not on file  Transportation Needs: Not on file  Physical Activity: Not on file  Stress: Not on file  Social Connections: Not on file  Intimate Partner Violence: Not on file    FAMILY HISTORY: Family History  Problem Relation Age of Onset   Arthritis Mother    Heart failure Father     ALLERGIES:  is allergic to pseudoephedrine, gabapentin, mobic [meloxicam], and penicillins.  MEDICATIONS:  Current Outpatient Medications  Medication Sig Dispense Refill   acetaminophen (TYLENOL) 500 MG tablet Take 1,000 mg by mouth every 6 (six) hours as needed  for moderate pain.      benzoyl peroxide (CERAVE ACNE FOAMING CREAM) 4 % external liquid Apply 1 application topically daily. Face     budesonide-formoterol (SYMBICORT) 80-4.5 MCG/ACT inhaler Inhale 2 puffs into the lungs in the morning and at bedtime. 1 each 11   camphor-menthol (SARNA) lotion Apply 1 application topically daily as needed for itching.     Carboxymethylcellulose Sodium (REFRESH CELLUVISC OP) Place 2 drops into both eyes See admin instructions. Instill 2  drop into both eyes twice daily (scheduled) & then use as needed throughout the day for dry/irritated eyes     clindamycin (CLEOCIN T) 1 % lotion Apply 1 application topically daily.     Cyanocobalamin (B-12 COMPLIANCE INJECTION) 1000 MCG/ML KIT Inject 1,000 mcg as directed See admin  instructions. As needed give at Chemo     fexofenadine (ALLEGRA) 180 MG tablet Take 180 mg by mouth in the morning.     fluticasone (FLONASE) 50 MCG/ACT nasal spray Place 1 spray into both nostrils daily.     folic acid (FOLVITE) 1 MG tablet Take 1 tablet (1 mg total) by mouth daily. (Patient taking differently: Take 1 mg by mouth in the morning.) 90 tablet 2   guaiFENesin (MUCINEX) 600 MG 12 hr tablet Take 600 mg by mouth 2 (two) times daily.      HYDROcodone-acetaminophen (HYCET) 7.5-325 mg/15 ml solution Take 10 mLs by mouth 2 (two) times daily. For pain from cancer and radiation reactions (Patient taking differently: Take 10 mLs by mouth 2 (two) times daily as needed for moderate pain or severe pain. For pain from cancer and radiation reactions) 473 mL 0   lidocaine (XYLOCAINE) 2 % solution Use as directed 15 mLs in the mouth or throat as needed for mouth pain. Swallow 30 min prior to meals and at bedtime for throat/esophagus pain 250 mL 1   lidocaine-prilocaine (EMLA) cream Apply 1 application topically as needed. (Patient taking differently: Apply 1 application topically as needed (port access).) 30 g 2   LORazepam (ATIVAN) 0.5 MG tablet Please take  1 tablet 30 minutes before your scan as needed for anxiety (Patient taking differently: Take 0.5 mg by mouth See admin instructions. Please take 1 tablet 30 minutes before your scan as needed for anxiety) 2 tablet 0   Multiple Vitamin (MULTIVITAMIN WITH MINERALS) TABS tablet Take 1 tablet by mouth daily. Alive     OXYGEN Inhale 3 L/min into the lungs continuous.     potassium chloride 20 MEQ/15ML (10%) SOLN Take 15 mLs (20 mEq total) by mouth 2 (two) times daily. 210 mL 0   PROAIR HFA 108 (90 Base) MCG/ACT inhaler INHALE 2 PUFFS BY MOUTH EVERY 6 HOURS AS NEEDED FOR WHEEZING OR SHORTNESS OF BREATH 9 g 0   prochlorperazine (COMPAZINE) 10 MG tablet Take 1 tablet (10 mg total) by mouth every 6 (six) hours as needed. (Patient taking differently: Take 10 mg by mouth every 6 (six) hours as needed (nausea with chemo).) 30 tablet 2   Soap & Cleansers (MEDERMA AG FACIAL TONER) LIQD Apply 1 application topically daily.     triamcinolone ointment (KENALOG) 0.1 % Apply 1 application topically in the morning and at bedtime.     No current facility-administered medications for this visit.    REVIEW OF SYSTEMS:   Constitutional: ( - ) fevers, ( - )  chills , ( - ) night sweats Eyes: ( - ) blurriness of vision, ( - ) double vision, ( - ) watery eyes Ears, nose, mouth, throat, and face: ( - ) mucositis, ( - ) sore throat Respiratory: ( - ) cough, ( - ) dyspnea, ( - ) wheezes Cardiovascular: ( - ) palpitation, ( - ) chest discomfort, ( - ) lower extremity swelling Gastrointestinal:  ( - ) nausea, ( - ) heartburn, ( - ) change in bowel habits Skin: ( - ) abnormal skin rashes Lymphatics: ( - ) new lymphadenopathy, ( - ) easy bruising Neurological: ( - ) numbness, ( - ) tingling, ( - ) new weaknesses Behavioral/Psych: ( - ) mood change, ( - ) new changes  All other systems were reviewed with the patient and are negative.  PHYSICAL EXAMINATION: ECOG PERFORMANCE STATUS: 3 - Symptomatic, >50% confined to  bed  Vitals:  01/17/21 1151  BP: (!) 156/77  Pulse: 82  Resp: 18  Temp: 97.6 F (36.4 C)  SpO2: 98%   Filed Weights   01/17/21 1151  Weight: 145 lb 9.6 oz (66 kg)    GENERAL: well appearing middle aged [place] American female. alert, no distress and comfortable SKIN: skin color, texture, turgor are normal, no rashes or significant lesions EYES: conjunctiva are pink and non-injected, sclera clear LUNGS: clear to auscultation and percussion with normal breathing effort HEART: regular rate & rhythm and no murmurs and no lower extremity edema Musculoskeletal: no cyanosis of digits and no clubbing.  PSYCH: alert & oriented x 3, fluent speech NEURO: no focal motor/sensory deficits   LABORATORY DATA:  I have reviewed the data as listed CBC Latest Ref Rng & Units 01/17/2021 12/27/2020 11/29/2020  WBC 4.0 - 10.5 K/uL 4.4 4.0 4.8  Hemoglobin 12.0 - 15.0 g/dL 10.0(L) 10.5(L) 10.7(L)  Hematocrit 36.0 - 46.0 % 30.6(L) 31.8(L) 33.1(L)  Platelets 150 - 400 K/uL 164 156 138(L)    CMP Latest Ref Rng & Units 01/17/2021 12/27/2020 11/29/2020  Glucose 70 - 99 mg/dL 92 97 93  BUN 8 - 23 mg/dL 15 11 13   Creatinine 0.44 - 1.00 mg/dL 1.10(H) 1.07(H) 1.17(H)  Sodium 135 - 145 mmol/L 139 144 144  Potassium 3.5 - 5.1 mmol/L 3.8 4.0 3.8  Chloride 98 - 111 mmol/L 104 106 107  CO2 22 - 32 mmol/L 28 28 29   Calcium 8.9 - 10.3 mg/dL 8.9 9.4 9.3  Total Protein 6.5 - 8.1 g/dL 6.9 7.1 6.7  Total Bilirubin 0.3 - 1.2 mg/dL 0.3 0.3 0.3  Alkaline Phos 38 - 126 U/L 44 51 52  AST 15 - 41 U/L 25 29 21   ALT 0 - 44 U/L 13 14 9     RADIOGRAPHIC STUDIES: MM 3D SCREEN BREAST BILATERAL  Result Date: 12/31/2020 CLINICAL DATA:  Screening. EXAM: DIGITAL SCREENING BILATERAL MAMMOGRAM WITH TOMOSYNTHESIS AND CAD TECHNIQUE: Bilateral screening digital craniocaudal and mediolateral oblique mammograms were obtained. Bilateral screening digital breast tomosynthesis was performed. The images were evaluated with computer-aided  detection. COMPARISON:  Previous exam(s). ACR Breast Density Category c: The breast tissue is heterogeneously dense, which may obscure small masses. FINDINGS: There are no findings suspicious for malignancy. IMPRESSION: No mammographic evidence of malignancy. A result letter of this screening mammogram will be mailed directly to the patient. RECOMMENDATION: Screening mammogram in one year. (Code:SM-B-01Y) BI-RADS CATEGORY  1: Negative. Electronically Signed   By: Kristopher Oppenheim M.D.   On: 12/31/2020 16:28     ASSESSMENT & PLAN Kevina A Kirschenbaum 60 y.o. female with medical history significant for metastatic adenocarcinoma of the lung who presents for follow up.    After review the labs, the records, discussion with the patient the findings are most consistent with metastatic adenocarcinoma the lung without any targetable mutations.  The patient has been started on carboplatin pemetrexed pembrolizumab every 3 weeks with Dr. Julien Nordmann, however the patient has expressed a desire to transition to another provider.  As such we were happy to take up her care here.  She already discontinued the carboplatin and is on maintenance pemetrexed/pembrolizumab therapy. We assumed her care with Cycle 6 on 04/04/2020.  Patient returns today for a follow up due to progressive fatigue, mid back pain and episodes of dizziness/presyncope. Labs from today were reviewed without any intervention needed. UA was unremarkable for UTI. Repeat CT imaging did not show progressive disease at last check.  # Metastatic Adenocarcinoma of the  Lung #Brain Metastasis --Most recent scan from 09/12/2020 showed the patient is having an excellent response to treatment with Carbo/Pem/Pem chemotherapy.   --no targetable mutations noted on prior workup. --continue to follow with Dr. Laqueta Due Onc for metastatic spread to the brain.  --can consider docetaxel/ramucirumab at time of progression.  Plan: --CT scan shows stable disease in Sept 2022.  Repeat due Dec 2022 --today is Cycle 16 Day 1 of maintenance Pem/Pem --monitor CMP, CBC, and TSH with each treatment --RTC in 3 weeks for Cycle 16 of chemotherapy  #Chemotherapy Induced Anemia --Hgb 10.0 today, stable from prior --continue to monitor   # Fatigue: --Uncertain etiology but likely chemotherapy/immunotherapy side effects  --Monitor closely  # Mid back pain: --no findings of metastasis to spine or structural problems with back on last imaging.    No orders of the defined types were placed in this encounter.  All questions were answered. The patient knows to call the clinic with any problems, questions or concerns.  I have spent a total of 30 minutes minutes of face-to-face and non-face-to-face time, preparing to see the patient, obtaining and/or reviewing separately obtained history, performing a medically appropriate examination, counseling and educating the patient, ordering tests, documenting clinical information in the electronic health record and care coordination.   Ledell Peoples, MD Department of Hematology/Oncology [place] at [facility] Phone: [phone] Pager: [phone] Email: [email or web address]   01/17/2021 12:39 PM

## 2021-01-17 NOTE — Progress Notes (Signed)
Had another RN to assess pt's portacath.  Blood return was obtain.

## 2021-01-17 NOTE — Patient Instructions (Signed)
[facility]  Discharge Instructions: Thank you for choosing [facility] to provide your oncology and hematology care.   If you have a lab appointment with the [facility], please go directly to the [facility] and check in at the registration area.   Wear comfortable clothing and clothing appropriate for easy access to any Portacath or PICC line.   We strive to give you quality time with your provider. You may need to reschedule your appointment if you arrive late (15 or more minutes).  Arriving late affects you and other patients whose appointments are after yours.  Also, if you miss three or more appointments without notifying the office, you may be dismissed from the clinic at the provider's discretion.      For prescription refill requests, have your pharmacy contact our office and allow 72 hours for refills to be completed.    Today you received the following chemotherapy and/or immunotherapy agents Pembrolizumab and Pemetrexed      To help prevent nausea and vomiting after your treatment, we encourage you to take your nausea medication as directed.  BELOW ARE SYMPTOMS THAT SHOULD BE REPORTED IMMEDIATELY: *FEVER GREATER THAN 100.4 F (38 C) OR HIGHER *CHILLS OR SWEATING *NAUSEA AND VOMITING THAT IS NOT CONTROLLED WITH YOUR NAUSEA MEDICATION *UNUSUAL SHORTNESS OF BREATH *UNUSUAL BRUISING OR BLEEDING *URINARY PROBLEMS (pain or burning when urinating, or frequent urination) *BOWEL PROBLEMS (unusual diarrhea, constipation, pain near the anus) TENDERNESS IN MOUTH AND THROAT WITH OR WITHOUT PRESENCE OF ULCERS (sore throat, sores in mouth, or a toothache) UNUSUAL RASH, SWELLING OR PAIN  UNUSUAL VAGINAL DISCHARGE OR ITCHING   Items with * indicate a potential emergency and should be followed up as soon as possible or go to the Emergency Department if any problems should occur.  Please show the CHEMOTHERAPY ALERT CARD or IMMUNOTHERAPY ALERT  CARD at check-in to the Emergency Department and triage nurse.  Should you have questions after your visit or need to cancel or reschedule your appointment, please contact [facility]  Dept: [phone]  and follow the prompts.  Office hours are 8:00 a.m. to 4:30 p.m. Monday - Friday. Please note that voicemails left after 4:00 p.m. may not be returned until the following business day.  We are closed weekends and major holidays. You have access to a nurse at all times for urgent questions. Please call the main number to the clinic Dept: [phone] and follow the prompts.   For any non-urgent questions, you may also contact your provider using MyChart. We now offer e-Visits for anyone 62 and older to request care online for non-urgent symptoms. For details visit [email or web address].   Also download the MyChart app! Go to the app store, search "MyChart", open the app, select [facility], and log in with your MyChart username and password.  Due to Covid, a mask is required upon entering the hospital/clinic. If you do not have a mask, one will be given to you upon arrival. For doctor visits, patients may have 1 support person aged 27 or older with them. For treatment visits, patients cannot have anyone with them due to current Covid guidelines and our immunocompromised population.

## 2021-01-22 ENCOUNTER — Telehealth: Payer: Self-pay | Admitting: Hematology and Oncology

## 2021-01-22 NOTE — Telephone Encounter (Signed)
Called patient regarding upcoming October and November appointments, patient hs been called and notified.

## 2021-01-23 ENCOUNTER — Other Ambulatory Visit (HOSPITAL_COMMUNITY): Payer: Medicaid Other

## 2021-01-28 ENCOUNTER — Encounter (HOSPITAL_COMMUNITY): Payer: Self-pay | Admitting: *Deleted

## 2021-01-28 ENCOUNTER — Other Ambulatory Visit: Payer: Self-pay

## 2021-01-28 NOTE — Progress Notes (Addendum)
Spoke with pt for pre-op call. Pt has hx of metastatic lung cancer. Pt states she is not diabetic and does not have a cardiac history.   H&P in Epic dated 01/17/21.  Pt's surgery is scheduled as ambulatory so no Covid test is required prior to surgery.

## 2021-01-30 ENCOUNTER — Encounter (HOSPITAL_COMMUNITY): Payer: Self-pay | Admitting: Internal Medicine

## 2021-01-30 ENCOUNTER — Encounter: Payer: Self-pay | Admitting: Internal Medicine

## 2021-01-30 ENCOUNTER — Encounter: Payer: Self-pay | Admitting: Physician Assistant

## 2021-01-30 ENCOUNTER — Encounter (HOSPITAL_COMMUNITY)
Admission: RE | Disposition: A | Discharge status: Home / Self Care | Payer: Self-pay | Source: Home / Self Care | Attending: Internal Medicine

## 2021-01-30 ENCOUNTER — Ambulatory Visit (HOSPITAL_COMMUNITY)
Admission: RE | Admit: 2021-01-30 | Discharge: 2021-01-30 | Disposition: A | Discharge status: Home / Self Care | Payer: Medicaid Other | Source: Ambulatory Visit | Attending: Internal Medicine | Admitting: Internal Medicine

## 2021-01-30 ENCOUNTER — Ambulatory Visit (HOSPITAL_COMMUNITY): Payer: Medicaid Other | Admitting: Anesthesiology

## 2021-01-30 ENCOUNTER — Ambulatory Visit (HOSPITAL_COMMUNITY)
Admission: RE | Admit: 2021-01-30 | Discharge: 2021-01-30 | Disposition: A | Discharge status: Home / Self Care | Payer: Medicaid Other | Attending: Internal Medicine | Admitting: Internal Medicine

## 2021-01-30 DIAGNOSIS — D6481 Anemia due to antineoplastic chemotherapy: Secondary | ICD-10-CM | POA: Diagnosis not present

## 2021-01-30 DIAGNOSIS — Z87891 Personal history of nicotine dependence: Secondary | ICD-10-CM | POA: Insufficient documentation

## 2021-01-30 DIAGNOSIS — C3491 Malignant neoplasm of unspecified part of right bronchus or lung: Secondary | ICD-10-CM | POA: Insufficient documentation

## 2021-01-30 DIAGNOSIS — G629 Polyneuropathy, unspecified: Secondary | ICD-10-CM | POA: Insufficient documentation

## 2021-01-30 DIAGNOSIS — Z923 Personal history of irradiation: Secondary | ICD-10-CM | POA: Diagnosis not present

## 2021-01-30 DIAGNOSIS — R42 Dizziness and giddiness: Secondary | ICD-10-CM | POA: Insufficient documentation

## 2021-01-30 DIAGNOSIS — Z9221 Personal history of antineoplastic chemotherapy: Secondary | ICD-10-CM | POA: Diagnosis not present

## 2021-01-30 DIAGNOSIS — K219 Gastro-esophageal reflux disease without esophagitis: Secondary | ICD-10-CM | POA: Insufficient documentation

## 2021-01-30 DIAGNOSIS — M549 Dorsalgia, unspecified: Secondary | ICD-10-CM | POA: Insufficient documentation

## 2021-01-30 DIAGNOSIS — R519 Headache, unspecified: Secondary | ICD-10-CM | POA: Insufficient documentation

## 2021-01-30 DIAGNOSIS — D759 Disease of blood and blood-forming organs, unspecified: Secondary | ICD-10-CM | POA: Insufficient documentation

## 2021-01-30 DIAGNOSIS — Z981 Arthrodesis status: Secondary | ICD-10-CM | POA: Diagnosis not present

## 2021-01-30 DIAGNOSIS — J449 Chronic obstructive pulmonary disease, unspecified: Secondary | ICD-10-CM | POA: Insufficient documentation

## 2021-01-30 DIAGNOSIS — C7931 Secondary malignant neoplasm of brain: Secondary | ICD-10-CM

## 2021-01-30 DIAGNOSIS — M1990 Unspecified osteoarthritis, unspecified site: Secondary | ICD-10-CM | POA: Insufficient documentation

## 2021-01-30 DIAGNOSIS — R5383 Other fatigue: Secondary | ICD-10-CM | POA: Diagnosis not present

## 2021-01-30 DIAGNOSIS — Z9981 Dependence on supplemental oxygen: Secondary | ICD-10-CM | POA: Diagnosis not present

## 2021-01-30 HISTORY — PX: RADIOLOGY WITH ANESTHESIA: SHX6223

## 2021-01-30 SURGERY — MRI WITH ANESTHESIA
Anesthesia: General

## 2021-01-30 MED ORDER — CHLORHEXIDINE GLUCONATE 0.12 % MT SOLN: 15.0000 mL | Freq: Once | OROMUCOSAL | Status: AC

## 2021-01-30 MED ORDER — LIDOCAINE 2% (20 MG/ML) 5 ML SYRINGE
INTRAMUSCULAR | Status: DC | PRN
  Administered 2021-01-30: 60 mg via INTRAVENOUS

## 2021-01-30 MED ORDER — PROPOFOL 10 MG/ML IV BOLUS
INTRAVENOUS | Status: DC | PRN
  Administered 2021-01-30: 130 mg via INTRAVENOUS

## 2021-01-30 MED ORDER — PHENYLEPHRINE HCL-NACL 20-0.9 MG/250ML-% IV SOLN
INTRAVENOUS | Status: DC | PRN
  Administered 2021-01-30: 20 ug/min via INTRAVENOUS

## 2021-01-30 MED ORDER — GADOBUTROL 1 MMOL/ML IV SOLN
6.0000 mL | Freq: Once | INTRAVENOUS | Status: AC | PRN
  Administered 2021-01-30: 6 mL via INTRAVENOUS

## 2021-01-30 MED ORDER — FENTANYL CITRATE (PF) 250 MCG/5ML IJ SOLN
INTRAMUSCULAR | Status: DC | PRN
  Administered 2021-01-30: 50 ug via INTRAVENOUS

## 2021-01-30 MED ORDER — ONDANSETRON HCL 4 MG/2ML IJ SOLN
INTRAMUSCULAR | Status: DC | PRN
  Administered 2021-01-30: 4 mg via INTRAVENOUS

## 2021-01-30 MED ORDER — DEXAMETHASONE SODIUM PHOSPHATE 10 MG/ML IJ SOLN
INTRAMUSCULAR | Status: DC | PRN
  Administered 2021-01-30: 10 mg via INTRAVENOUS

## 2021-01-30 MED ORDER — CHLORHEXIDINE GLUCONATE 0.12 % MT SOLN
OROMUCOSAL | Status: AC
  Administered 2021-01-30: 15 mL via OROMUCOSAL
  Filled 2021-01-30: qty 15

## 2021-01-30 MED ORDER — ONDANSETRON HCL 4 MG/2ML IJ SOLN: 4.0000 mg | Freq: Once | INTRAMUSCULAR | Status: DC | PRN

## 2021-01-30 MED ORDER — LACTATED RINGERS IV SOLN: INTRAVENOUS | Status: DC

## 2021-01-30 MED ORDER — ORAL CARE MOUTH RINSE: 15.0000 mL | Freq: Once | OROMUCOSAL | Status: AC

## 2021-01-30 MED ORDER — FENTANYL CITRATE (PF) 100 MCG/2ML IJ SOLN: 25.0000 ug | INTRAMUSCULAR | Status: DC | PRN

## 2021-01-30 NOTE — Anesthesia Procedure Notes (Signed)
Procedure Name: LMA Insertion Date/Time: 01/30/2021 10:19 AM Performed by: Vonna Drafts, CRNA Pre-anesthesia Checklist: Patient identified, Emergency Drugs available, Suction available, Patient being monitored and Timeout performed Patient Re-evaluated:Patient Re-evaluated prior to induction Oxygen Delivery Method: Circle system utilized Preoxygenation: Pre-oxygenation with 100% oxygen Induction Type: IV induction LMA: LMA inserted LMA Size: 3.0 Number of attempts: 1 Dental Injury: Teeth and Oropharynx as per pre-operative assessment

## 2021-01-30 NOTE — Anesthesia Preprocedure Evaluation (Addendum)
Anesthesia Evaluation  Patient identified by MRN, date of birth, ID band Patient awake    Reviewed: Allergy & Precautions, NPO status , Patient's Chart, lab work & pertinent test results  Airway Mallampati: II  TM Distance: >3 FB Neck ROM: Full    Dental  (+) Teeth Intact, Dental Advisory Given   Pulmonary asthma , COPD,  COPD inhaler and oxygen dependent, former smoker,  Lung ca with mets    Pulmonary exam normal breath sounds clear to auscultation       Cardiovascular Normal cardiovascular exam Rhythm:Regular Rate:Normal     Neuro/Psych  Headaches, BRAIN METASTASES S/p ACDF  Neuromuscular disease    GI/Hepatic Neg liver ROS, GERD  ,  Endo/Other  negative endocrine ROS  Renal/GU negative Renal ROS     Musculoskeletal  (+) Arthritis ,   Abdominal   Peds  Hematology  (+) Blood dyscrasia, anemia ,   Anesthesia Other Findings Day of surgery medications reviewed with the patient.  Reproductive/Obstetrics                            Anesthesia Physical Anesthesia Plan  ASA: 4  Anesthesia Plan: General   Post-op Pain Management:    Induction: Intravenous  PONV Risk Score and Plan: 3 and Dexamethasone and Ondansetron  Airway Management Planned: LMA  Additional Equipment:   Intra-op Plan:   Post-operative Plan: Extubation in OR  Informed Consent: I have reviewed the patients History and Physical, chart, labs and discussed the procedure including the risks, benefits and alternatives for the proposed anesthesia with the patient or authorized representative who has indicated his/her understanding and acceptance.     Dental advisory given  Plan Discussed with: CRNA  Anesthesia Plan Comments:        Anesthesia Quick Evaluation

## 2021-01-30 NOTE — Transfer of Care (Signed)
Immediate Anesthesia Transfer of Care Note  Patient: [facility]  Procedure(s) Performed: MRI BRAIN WITH AND WITHOUT CONTRASTWITH ANESTHESIA  Patient Location: PACU  Anesthesia Type:General  Level of Consciousness: drowsy  Airway & Oxygen Therapy: Patient Spontanous Breathing  Post-op Assessment: Report given to RN and Post -op Vital signs reviewed and stable  Post vital signs: Reviewed and stable  Last Vitals:  Vitals Value Taken Time  BP 146/75 01/30/21 1144  Temp    Pulse    Resp 16 01/30/21 1145  SpO2    Vitals shown include unvalidated device data.  Last Pain:  Vitals:   01/30/21 0847  TempSrc:   PainSc: 6       Patients Stated Pain Goal: 4 (21/62/44 6950)  Complications: No notable events documented.

## 2021-01-31 ENCOUNTER — Other Ambulatory Visit: Payer: Self-pay

## 2021-01-31 ENCOUNTER — Encounter (HOSPITAL_COMMUNITY): Payer: Self-pay | Admitting: Radiology

## 2021-01-31 ENCOUNTER — Ambulatory Visit: Payer: Self-pay

## 2021-01-31 ENCOUNTER — Ambulatory Visit: Payer: Self-pay | Admitting: Hematology and Oncology

## 2021-01-31 NOTE — Anesthesia Postprocedure Evaluation (Signed)
Anesthesia Post Note  Patient: [facility]  Procedure(s) Performed: MRI BRAIN WITH AND WITHOUT CONTRASTWITH ANESTHESIA     Patient location during evaluation: PACU Anesthesia Type: General Level of consciousness: awake and alert Pain management: pain level controlled Vital Signs Assessment: post-procedure vital signs reviewed and stable Respiratory status: spontaneous breathing, nonlabored ventilation and respiratory function stable Cardiovascular status: blood pressure returned to baseline and stable Postop Assessment: no apparent nausea or vomiting Anesthetic complications: no   No notable events documented.  Last Vitals:  Vitals:   01/30/21 1145 01/30/21 1200  BP:  136/67  Pulse:  66  Resp:  18  Temp: 36.4 C   SpO2: 95% 98%    Last Pain:  Vitals:   01/30/21 1200  TempSrc:   PainSc: Asleep                 Catalina Gravel

## 2021-02-03 ENCOUNTER — Other Ambulatory Visit: Payer: Self-pay | Admitting: Radiation Oncology

## 2021-02-03 ENCOUNTER — Other Ambulatory Visit: Payer: Self-pay

## 2021-02-03 ENCOUNTER — Encounter: Payer: Self-pay | Admitting: Physician Assistant

## 2021-02-03 ENCOUNTER — Inpatient Hospital Stay (HOSPITAL_BASED_OUTPATIENT_CLINIC_OR_DEPARTMENT_OTHER): Payer: Medicaid Other | Admitting: Internal Medicine

## 2021-02-03 ENCOUNTER — Other Ambulatory Visit: Payer: Self-pay | Admitting: Emergency Medicine

## 2021-02-03 ENCOUNTER — Encounter: Payer: Self-pay | Admitting: Internal Medicine

## 2021-02-03 VITALS — BP 149/62 | HR 86 | Temp 97.7°F | Resp 20 | Ht 65.5 in | Wt 148.4 lb

## 2021-02-03 DIAGNOSIS — C7931 Secondary malignant neoplasm of brain: Secondary | ICD-10-CM | POA: Diagnosis not present

## 2021-02-03 DIAGNOSIS — C7949 Secondary malignant neoplasm of other parts of nervous system: Secondary | ICD-10-CM

## 2021-02-03 DIAGNOSIS — Z5111 Encounter for antineoplastic chemotherapy: Secondary | ICD-10-CM | POA: Diagnosis not present

## 2021-02-03 NOTE — Progress Notes (Signed)
[facility] [place], [place] [address] [phone]   Interval Evaluation  Date of Service: 02/03/21 Patient Name: Elizabeth Mayer Patient MRN: [id number] Patient DOB: 1960/12/04 Provider: Ventura Sellers, MD  Identifying Statement:  Elizabeth Mayer is a 60 y.o. female with Brain metastases ([place]) [C79.31]   Primary Cancer:  Oncologic History: Oncology History  Non-small cell lung cancer, right ([place])  11/08/2019 Initial Diagnosis   Non-small cell lung cancer, right ([place])   12/14/2019 - 12/14/2019 Chemotherapy   The patient had cemiplimab-rwlc (LIBTAYO) 350 mg in sodium chloride 0.9 % 100 mL chemo infusion, 350 mg, Intravenous, Once, 0 of 6 cycles  for chemotherapy treatment.     12/14/2019 -  Chemotherapy   Patient is on Treatment Plan : LUNG CARBOplatin / Pemetrexed / Pembrolizumab q21d Induction x 4 cycles / Maintenance Pemetrexed + Pembrolizumab      CNS Oncologic History 01/12/20: SRS to 22 targets Elizabeth Mayer)  Interval History:  Elizabeth Mayer presents today for follow up after recent MRI brain with anesthesia.  No new or progressive changes.  Denies any severe headaches this past month, though does acknowledge some visual floaters, also not new.  No issues with her gait, continues on pem/pem maintenance with Dr. Lorenso Courier.    H+P (03/14/20) Patient presents today for follow up after recent MRI brain.  She describes no new or progressive neurologic deficits.  No seizures or headaches.  Still independent with gait and activities of daily living.  Continues on chemotherapy with Dr. Julien Nordmann.  Currently taking decadron 59m daily.  Medications: Current Outpatient Medications on File Prior to Visit  Medication Sig Dispense Refill   acetaminophen (TYLENOL) 500 MG tablet Take 1,000 mg by mouth every 6 (six) hours as needed for moderate pain.      benzoyl peroxide (CERAVE ACNE FOAMING CREAM) 4 % external liquid Apply 1 application  topically 2 (two) times daily. Face     budesonide-formoterol (SYMBICORT) 80-4.5 MCG/ACT inhaler Inhale 2 puffs into the lungs in the morning and at bedtime. 1 each 11   camphor-menthol (SARNA) lotion Apply 1 application topically daily as needed for itching.     Carboxymethylcellulose Sodium (REFRESH CELLUVISC OP) Place 2 drops into both eyes See admin instructions. Instill 2  drop into both eyes twice daily (scheduled) & then use as needed throughout the day for dry/irritated eyes     clindamycin (CLEOCIN T) 1 % lotion Apply 1 application topically 2 (two) times daily.     Cyanocobalamin (B-12 COMPLIANCE INJECTION) 1000 MCG/ML KIT Inject 1,000 mcg as directed See admin instructions. As needed give at Chemo     fexofenadine (ALLEGRA) 180 MG tablet Take 180 mg by mouth in the morning.     fluticasone (FLONASE) 50 MCG/ACT nasal spray Place 1 spray into both nostrils daily.     folic acid (FOLVITE) 1 MG tablet Take 1 tablet (1 mg total) by mouth daily. (Patient taking differently: Take 1 mg by mouth in the morning.) 90 tablet 2   guaiFENesin (MUCINEX) 600 MG 12 hr tablet Take 600 mg by mouth 2 (two) times daily.      HYDROcodone-acetaminophen (HYCET) 7.5-325 mg/15 ml solution Take 10 mLs by mouth 2 (two) times daily. For pain from cancer and radiation reactions (Patient taking differently: Take 10 mLs by mouth 2 (two) times daily as needed for moderate pain or severe pain. For pain from cancer and radiation reactions) 473 mL 0  lidocaine (XYLOCAINE) 2 % solution Use as directed 15 mLs in the mouth or throat as needed for mouth pain. Swallow 30 min prior to meals and at bedtime for throat/esophagus pain 250 mL 1   lidocaine-prilocaine (EMLA) cream Apply 1 application topically as needed. (Patient taking differently: Apply 1 application topically as needed (port access). Every three weeks) 30 g 2   Multiple Vitamin (MULTIVITAMIN WITH MINERALS) TABS tablet Take 2 tablets by mouth daily. Alive/ gummies      OXYGEN Inhale 3 L/min into the lungs continuous.     potassium chloride 20 MEQ/15ML (10%) SOLN Take 15 mLs (20 mEq total) by mouth 2 (two) times daily. (Patient taking differently: Take 20 mEq by mouth daily as needed (Low patassium).) 210 mL 0   PROAIR HFA 108 (90 Base) MCG/ACT inhaler INHALE 2 PUFFS BY MOUTH EVERY 6 HOURS AS NEEDED FOR WHEEZING OR SHORTNESS OF BREATH (Patient taking differently: Inhale 2 puffs into the lungs See admin instructions. Take twice a day and as many time as needed during the day) 9 g 0   prochlorperazine (COMPAZINE) 10 MG tablet Take 1 tablet (10 mg total) by mouth every 6 (six) hours as needed. (Patient taking differently: Take 10 mg by mouth every 6 (six) hours as needed (nausea with chemo).) 30 tablet 2   Skin Protectants, Misc. (EUCERIN) cream Apply 1 application topically as needed (Ezcema).     Soap & Cleansers (MEDERMA AG FACIAL TONER) LIQD Apply 1 application topically 2 (two) times daily. Advance dry skin     triamcinolone ointment (KENALOG) 0.1 % Apply 1 application topically in the morning and at bedtime.     No current facility-administered medications on file prior to visit.    Allergies:  Allergies  Allergen Reactions   Pseudoephedrine Hypertension   Contrast Media [Iodinated Diagnostic Agents] Other (See Comments)    Burning/dizzy Think it was a change in the brand never had a problem before   Gabapentin Other (See Comments)    Raise blood pressure and red rings around eyes Blood vessels popped in her eyes   Mobic [Meloxicam] Swelling    Inflamed the area that has inflammation and stabbing pains in the area   Penicillins Hives    Reaction: Childhood   Past Medical History:  Past Medical History:  Diagnosis Date   Anemia    Angina 1982   related to stress   Asthma    in the past,  no current problems   Complication of anesthesia    states anesthesia made her hair fall out   Constipation    Dyspnea    on oxygen at home - 2L via [place]    Fatigue    GERD (gastroesophageal reflux disease)    patient denies this dx   Headache    Heart murmur 1970s   no problems currently   Ingrown toenail    Lung cancer ([place]) 10/2019   metastatic disease to the brain   On home oxygen therapy    2.2 lpm, 24 hours a day   Past heart attack 1980-1981   pt states she passed out and woke up in hospital- told she had heart attack, but then dr said he couldn't find anything wrong.   Pneumonia    x 1   Past Surgical History:  Past Surgical History:  Procedure Laterality Date   CERVICAL DISC SURGERY  2000   Disc removed from neck    ingrown toe nail surgery Bilateral    IR  IMAGING GUIDED PORT INSERTION  02/16/2020   MULTIPLE TOOTH EXTRACTIONS     for braces   RADIOLOGY WITH ANESTHESIA N/A 12/05/2019   Procedure: MRI BRAIN WITH AND WITHOUT CONTRAST;  Surgeon: Radiologist, Medication, MD;  Location: [facility];  Service: Radiology;  Laterality: N/A;   RADIOLOGY WITH ANESTHESIA N/A 01/02/2020   Procedure: MRI BRAIN WITH AND WITHOUT CONTRAST;  Surgeon: Radiologist, Medication, MD;  Location: [facility];  Service: Radiology;  Laterality: N/A;   RADIOLOGY WITH ANESTHESIA N/A 02/20/2020   Procedure: MRI WITH ANESTHESIA OF BRAIN WITH AND WITHOUT CONTRAST;  Surgeon: Radiologist, Medication, MD;  Location: [facility];  Service: Radiology;  Laterality: N/A;   RADIOLOGY WITH ANESTHESIA N/A 06/11/2020   Procedure: MRI WITH ANESTHESIA OF BRAIN WITH AND WITHOUT CONTRAST;  Surgeon: Radiologist, Medication, MD;  Location: [facility];  Service: Radiology;  Laterality: N/A;   RADIOLOGY WITH ANESTHESIA N/A 10/10/2020   Procedure: MRI WITH ANESTHESIA BRAIN WITH AND WITHOUT CONTRAST;  Surgeon: Radiologist, Medication, MD;  Location: [facility];  Service: Radiology;  Laterality: N/A;   RADIOLOGY WITH ANESTHESIA N/A 01/30/2021   Procedure: MRI BRAIN WITH AND WITHOUT CONTRASTWITH ANESTHESIA;  Surgeon: Radiologist, Medication, MD;  Location: [facility];  Service: Radiology;  Laterality: N/A;    TONSILLECTOMY     Social History:  Social History   Socioeconomic History   Marital status: Legally Separated    Spouse name: Not on file   Number of children: 0   Years of education: Not on file   Highest education level: Not on file  Occupational History   Occupation: Secondary school teacher  Tobacco Use   Smoking status: Former    Packs/day: 1.00    Years: 20.00    Pack years: 20.00    Types: Cigarettes    Start date: 16    Quit date: 2001    Years since quitting: 21.8   Smokeless tobacco: Never  Vaping Use   Vaping Use: Never used  Substance and Sexual Activity   Alcohol use: No   Drug use: No   Sexual activity: Yes    Birth control/protection: None, Post-menopausal  Other Topics Concern   Not on file  Social History Narrative   Not on file   Social Determinants of Health   Financial Resource Strain: Not on file  Food Insecurity: Not on file  Transportation Needs: Not on file  Physical Activity: Not on file  Stress: Not on file  Social Connections: Not on file  Intimate Partner Violence: Not on file   Family History:  Family History  Problem Relation Age of Onset   Arthritis Mother    Heart failure Father     Review of Systems: Constitutional: Doesn't report fevers, chills or abnormal weight loss Eyes: Doesn't report blurriness of vision Ears, nose, mouth, throat, and face: Doesn't report sore throat Respiratory: Doesn't report cough, dyspnea or wheezes Cardiovascular: Doesn't report palpitation, chest discomfort  Gastrointestinal:  Doesn't report nausea, constipation, diarrhea GU: Doesn't report incontinence Skin: Doesn't report skin rashes Neurological: Per HPI Musculoskeletal: Doesn't report joint pain Behavioral/Psych: Doesn't report anxiety  Physical Exam: Vitals:   02/03/21 1116  BP: (!) 149/62  Pulse: 86  Resp: 20  Temp: 97.7 F (36.5 C)  SpO2: 100%   KPS: 90. General: With supplemental O2 Head: Normal EENT: No conjunctival injection  or scleral icterus.  Lungs: Resp effort normal Cardiac: Regular rate Abdomen: Non-distended abdomen Skin: No rashes cyanosis or petechiae. Extremities: No clubbing or edema  Neurologic Exam: Mental Status: Awake,  alert, attentive to examiner. Oriented to self and environment. Language is fluent with intact comprehension.  Cranial Nerves: Visual acuity is grossly normal. Visual fields are full. Extra-ocular movements intact. No ptosis. Face is symmetric Motor: Tone and bulk are normal. Power is full in both arms and legs. Reflexes are symmetric, no pathologic reflexes present.  Sensory: Intact to light touch Gait: Normal.   Labs: I have reviewed the data as listed    Component Value Date/Time   NA 139 01/17/2021 1127   NA 141 08/26/2020 1546   K 3.8 01/17/2021 1127   CL 104 01/17/2021 1127   CO2 28 01/17/2021 1127   GLUCOSE 92 01/17/2021 1127   BUN 15 01/17/2021 1127   BUN 12 08/26/2020 1546   CREATININE 1.10 (H) 01/17/2021 1127   CALCIUM 8.9 01/17/2021 1127   PROT 6.9 01/17/2021 1127   PROT 6.8 08/26/2020 1546   ALBUMIN 3.6 01/17/2021 1127   ALBUMIN 4.1 08/26/2020 1546   AST 25 01/17/2021 1127   ALT 13 01/17/2021 1127   ALKPHOS 44 01/17/2021 1127   BILITOT 0.3 01/17/2021 1127   GFRNONAA 57 (L) 01/17/2021 1127   GFRAA >60 01/08/2020 1342   GFRAA >60 01/04/2020 0907   Lab Results  Component Value Date   WBC 4.4 01/17/2021   NEUTROABS 2.7 01/17/2021   HGB 10.0 (L) 01/17/2021   HCT 30.6 (L) 01/17/2021   MCV 92.2 01/17/2021   PLT 164 01/17/2021    Imaging:  MR BRAIN W WO CONTRAST  Result Date: 01/30/2021 CLINICAL DATA:  Brain metastases. Brain/CNS neoplasm, assess treatment response. Metastatic lung cancer status post SRS. EXAM: MRI HEAD WITHOUT AND WITH CONTRAST TECHNIQUE: Multiplanar, multiecho pulse sequences of the brain and surrounding structures were obtained without and with intravenous contrast. CONTRAST:  52m GADAVIST GADOBUTROL 1 MMOL/ML IV SOLN  COMPARISON:  Prior brain MRI examinations 10/10/2020 and earlier. FINDINGS: Brain: Mild generalized cerebral atrophy. Again demonstrated are multiple enhancing intracranial metastases, as follows. Stable 10 mm ring-enhancing lesion within the high medial left frontal lobe (series 1200, image 235). Stable mild surrounding T2 FLAIR hyperintensity. Stable adjacent punctate enhancing lesion within the high medial left frontal lobe (series 1200, image 236). Stable punctate enhancing lesion within the high anterior left frontal lobe (series 1200, image 214). Stable 8 mm ring-enhancing lesion within the medial right frontal lobe precentral gyrus (series 1200, image 232). Stable mild surrounding T2 FLAIR hyperintensity. Stable 3 mm enhancing lesion more laterally within the right frontal lobe precentral gyrus (series 1200, image 224). Stable 4 mm nodular enhancing cortical lesion within the right parietal lobe (series 1200, image 203). Stable 6 mm ring-enhancing lesion within the anterior right temporal lobe (series 1200, image 158). There is recurrent enhancement at site of a treated lesion within the anterior left frontal lobe, with enhancement measuring 5 mm on the current exam (series 1200, image 199). There is no appreciable surrounding edema. As before, there are chronic blood products associated with many of the above lesions. Background mild multifocal T2 FLAIR hyperintense signal abnormality, likely reflecting a combination of chronic small vessel ischemic disease and post-treatment changes. There is no acute infarct. No extra-axial fluid collection. No midline shift. Vascular: Maintained flow voids within the proximal large arterial vessels. Skull and upper cervical spine: No focal suspicious marrow lesion. C5-C6 vertebral body ankylosis. Sinuses/Orbits: Visualized orbits show no acute finding. Trace scattered paranasal sinus mucosal thickening. Other: Small left mastoid effusion. IMPRESSION: Recurrent enhancement  at site of a treated lesion within the anterior left  frontal lobe, measuring 5 mm. Enhancement has not been present at this site since the prior brain MRI of 02/20/2020. No appreciable surrounding edema. The remaining intracranial metastases have remained stable since the prior MRI of 10/10/2020, as detailed. Electronically Signed   By: Kellie Simmering D.O.   On: 01/30/2021 12:10     [facility] Clinician Interpretation: I have personally reviewed the radiological images as listed.  My interpretation, in the context of the patient's clinical presentation, is progressive disease   Assessment/Plan Brain metastases ([place]) [C79.31]  Ader A Frede is clinically stable today.  Brain MRI demonstrates novel focus of enhancement within left mesial frontal lobe c/w new metastasis, though some faint enhancement was visible on prior study.  This was reviewed in conjunction with SRS treatment plan and dosimetry map from last year with Dr. Isidore Mayer.    We did discuss and recommend salvage radiosurgery for this new target given likelihood of active disease.  She is agreeable with this plan, but will need oral sedation for SIM and treatment like last year.  For headaches, con't PRN analgesia.   We appreciate the opportunity to participate in the care of Elizabeth Mayer.  Radiation coordinator will call her to arrange SIM time.  We ask that [place] return to clinic in 3 months following post-SRS MRI, or sooner as needed.  All questions were answered. The patient knows to call the clinic with any problems, questions or concerns. No barriers to learning were detected.  The total time spent in the encounter was 30 minutes and more than 50% was on counseling and review of test results   Ventura Sellers, MD Medical Director of Neuro-Oncology [facility] at [place] 02/03/21 10:32 AM

## 2021-02-03 NOTE — Progress Notes (Signed)
Radiation Oncology         [phone] ________________________________  Outpatient Re-evaluation  Name: Elizabeth Mayer MRN: [id number]  Date: 02/04/2021  DOB: 12/04/1960  CC:Moore, Janece, FNP  Moore, Janece, FNP   REFERRING PHYSICIAN: Moore, Janece, FNP  DIAGNOSIS:     ICD-10-CM   1. Brain metastases (HCC)  C79.31 LORazepam (ATIVAN) injection 1 mg      Metastatic Adenocarcinoma of the Lung, brain mets  Non-small cell lung cancer of the right lung diagnosed on 11/08/19.  HISTORY OF PRESENT ILLNESS::Elizabeth Mayer is a 60 y.o. female who presents today to re-evaluation of brain metastasis, she was last seen for follow-up on 02/23/2020. Since her last visit, the patient continued on chemotherapy under Dr. Mohamed consisting of carboplatin, pemetrexed and pembrolizumab, and is now on maintenance consisting of pemetrexed and pembrolizumab followed by Dr. Dorsey.   CT of the chest abdomen and pelvis on 12/16/20 demonstrated unchanged post treatment/post radiation consolidation and fibrosis of the perihilar right lung and right hilar and pretracheal soft tissue thickening. No discretely enlarged mediastinal, hilar, or axillary lymph nodes were appreciated. No evidence of distant metastatic disease in the abdomen or pelvis seen.  MRI of the brain with and without contrast on 01/30/21 demonstrated recurrent enhancement at site of a treated lesion within the anterior left frontal lobe, measuring 5 mm. Enhancement has not been present at this site since the prior brain MRI of 02/20/2020. No appreciable surrounding edema was seen. The remaining intracranial metastases have remained stable since the prior MRI of 10/10/2020.   The patient's imaging was discussed at our tumor board earlier this week.  I determined that the left frontal lobe lesion has not been previously treated with radiation.  Based on its behavior it is suspicious for progressive disease.  Patient most recently followed up with  Dr. Vaslow on 02/03/21. During this visit, the patient was noted to be doing well overall with no new or progressive findings. She denied any significant headaches though did report some visual floaters which is not new for her. Given new finding on recent MRI of focus of enhancement within left mesial frontal lobe and possible new metastasis, the patient was recommended salvage radiosurgery which she is agreeable to.     Dose of Decadron, if applicable: Not currently prescribed  Recent neurologic symptoms, if any:  Seizures: None Headaches: Reports occasional headaches (describes them as sharp/acute, and states they resolve on their own without need for OTC pain medication) Nausea: Denies any issues, but does report a decreased appetite  Dizziness/ataxia: Reports occasionally feeling dizzy/off balance when she stands, but denies any falls Difficulty with hand coordination: Patient denies Focal numbness/weakness: Reports mild weakness to her upper extremities and numbness/sharpness sensation to her feet/hands Visual deficits/changes: Continues to deal with "white floaters" and occasional difficulty focusing (states floaters resolve after she closes her eyes). Also reports some difficulty using her left eye because of a persistent eyelid droop Confusion/Memory deficits: Patient denies any new or worsening concerns  PREVIOUS RADIATION THERAPY:   Radiation Treatment Date for Brain SRS:   01/12/2020 Site Technique Total Dose (Gy) Dose per Fx (Gy) Completed Fx Beam Energies               Brain: Brain_SRS (22 targets) IMRT 20/20 20 1/1 6XFFF    Radiation Treatment Dates: 11/15/2019 through 12/06/2019 (Dr. Kinard) Site Technique Total Dose (Gy) Dose per Fx (Gy) Completed Fx Beam Energies  Lung, Right: Lung_Rt 3D 35/35 2.5 14/14 6X, 10X, 15X       PAST MEDICAL HISTORY:  has a past medical history of Anemia, Angina (4481), Asthma, Complication of anesthesia, Constipation, Dyspnea, Fatigue, GERD  (gastroesophageal reflux disease), Headache, Heart murmur (1970s), Ingrown toenail, Lung cancer ([place]) (10/2019), On home oxygen therapy, Past heart attack ([id number]), and Pneumonia.    PAST SURGICAL HISTORY: Past Surgical History:  Procedure Laterality Date   CERVICAL DISC SURGERY  2000   Disc removed from neck    ingrown toe nail surgery Bilateral    IR IMAGING GUIDED PORT INSERTION  02/16/2020   MULTIPLE TOOTH EXTRACTIONS     for braces   RADIOLOGY WITH ANESTHESIA N/A 12/05/2019   Procedure: MRI BRAIN WITH AND WITHOUT CONTRAST;  Surgeon: Radiologist, Medication, MD;  Location: [facility];  Service: Radiology;  Laterality: N/A;   RADIOLOGY WITH ANESTHESIA N/A 01/02/2020   Procedure: MRI BRAIN WITH AND WITHOUT CONTRAST;  Surgeon: Radiologist, Medication, MD;  Location: [facility];  Service: Radiology;  Laterality: N/A;   RADIOLOGY WITH ANESTHESIA N/A 02/20/2020   Procedure: MRI WITH ANESTHESIA OF BRAIN WITH AND WITHOUT CONTRAST;  Surgeon: Radiologist, Medication, MD;  Location: [facility];  Service: Radiology;  Laterality: N/A;   RADIOLOGY WITH ANESTHESIA N/A 06/11/2020   Procedure: MRI WITH ANESTHESIA OF BRAIN WITH AND WITHOUT CONTRAST;  Surgeon: Radiologist, Medication, MD;  Location: [facility];  Service: Radiology;  Laterality: N/A;   RADIOLOGY WITH ANESTHESIA N/A 10/10/2020   Procedure: MRI WITH ANESTHESIA BRAIN WITH AND WITHOUT CONTRAST;  Surgeon: Radiologist, Medication, MD;  Location: [facility];  Service: Radiology;  Laterality: N/A;   RADIOLOGY WITH ANESTHESIA N/A 01/30/2021   Procedure: MRI BRAIN WITH AND WITHOUT CONTRASTWITH ANESTHESIA;  Surgeon: Radiologist, Medication, MD;  Location: [facility];  Service: Radiology;  Laterality: N/A;   TONSILLECTOMY      FAMILY HISTORY: family history includes Arthritis in her mother; Heart failure in her father.  SOCIAL HISTORY:  reports that she quit smoking about 21 years ago. Her smoking use included cigarettes. She started smoking about 42 years ago. She has a 20.00  pack-year smoking history. She has never used smokeless tobacco. She reports that she does not drink alcohol and does not use drugs.  ALLERGIES: Pseudoephedrine, Contrast media [iodinated diagnostic agents], Gabapentin, Mobic [meloxicam], and Penicillins  MEDICATIONS:  Current Outpatient Medications  Medication Sig Dispense Refill   acetaminophen (TYLENOL) 500 MG tablet Take 1,000 mg by mouth every 6 (six) hours as needed for moderate pain.      benzoyl peroxide (CERAVE ACNE FOAMING CREAM) 4 % external liquid Apply 1 application topically 2 (two) times daily. Face     camphor-menthol (SARNA) lotion Apply 1 application topically daily as needed for itching.     Carboxymethylcellulose Sodium (REFRESH CELLUVISC OP) Place 2 drops into both eyes See admin instructions. Instill 2  drop into both eyes twice daily (scheduled) & then use as needed throughout the day for dry/irritated eyes     clindamycin (CLEOCIN T) 1 % lotion Apply 1 application topically 2 (two) times daily.     Cyanocobalamin (B-12 COMPLIANCE INJECTION) 1000 MCG/ML KIT Inject 1,000 mcg as directed See admin instructions. As needed give at Chemo     fexofenadine (ALLEGRA) 180 MG tablet Take 180 mg by mouth in the morning.     fluticasone (FLONASE) 50 MCG/ACT nasal spray Place 1 spray into both nostrils daily.     folic acid (FOLVITE) 1 MG tablet Take 1 tablet (1 mg total) by mouth daily. (Patient taking differently: Take 1 mg by mouth in the morning.) 90  tablet 2   guaiFENesin (MUCINEX) 600 MG 12 hr tablet Take 600 mg by mouth 2 (two) times daily.      HYDROcodone-acetaminophen (HYCET) 7.5-325 mg/15 ml solution Take 10 mLs by mouth 2 (two) times daily. For pain from cancer and radiation reactions (Patient taking differently: Take 10 mLs by mouth 2 (two) times daily as needed for moderate pain or severe pain. For pain from cancer and radiation reactions) 473 mL 0   lidocaine (XYLOCAINE) 2 % solution Use as directed 15 mLs in the mouth or  throat as needed for mouth pain. Swallow 30 min prior to meals and at bedtime for throat/esophagus pain 250 mL 1   lidocaine-prilocaine (EMLA) cream Apply 1 application topically as needed. (Patient taking differently: Apply 1 application topically as needed (port access). Every three weeks) 30 g 2   Multiple Vitamin (MULTIVITAMIN WITH MINERALS) TABS tablet Take 2 tablets by mouth daily. Alive/ gummies     OXYGEN Inhale 3 L/min into the lungs continuous.     potassium chloride 20 MEQ/15ML (10%) SOLN Take 15 mLs (20 mEq total) by mouth 2 (two) times daily. (Patient taking differently: Take 20 mEq by mouth daily as needed (Low patassium).) 210 mL 0   PROAIR HFA 108 (90 Base) MCG/ACT inhaler INHALE 2 PUFFS BY MOUTH EVERY 6 HOURS AS NEEDED FOR WHEEZING OR SHORTNESS OF BREATH (Patient taking differently: Inhale 2 puffs into the lungs See admin instructions. Take twice a day and as many time as needed during the day) 9 g 0   prochlorperazine (COMPAZINE) 10 MG tablet Take 1 tablet (10 mg total) by mouth every 6 (six) hours as needed. (Patient taking differently: Take 10 mg by mouth every 6 (six) hours as needed (nausea with chemo).) 30 tablet 2   Skin Protectants, Misc. (EUCERIN) cream Apply 1 application topically as needed (Ezcema).     Soap & Cleansers (MEDERMA AG FACIAL TONER) LIQD Apply 1 application topically 2 (two) times daily. Advance dry skin     SYMBICORT 80-4.5 MCG/ACT inhaler INHALE 2 PUFFS BY MOUTH IN THE MORNING AND AT BEDTIME 11 g 0   triamcinolone ointment (KENALOG) 0.1 % Apply 1 application topically in the morning and at bedtime.     No current facility-administered medications for this encounter.    REVIEW OF SYSTEMS:  As above.   PHYSICAL EXAM:  height is 5' 5.5" (1.664 m) and weight is 147 lb (66.7 kg). Her temporal temperature is 97.6 F (36.4 C). Her blood pressure is 170/79 (abnormal) and her pulse is 85. Her respiration is 18 and oxygen saturation is 100%.   General: Alert and  oriented, in no acute distress  HEENT: Head is normocephalic. Extraocular movements are intact.  Musculoskeletal: Ambulatory  neurologic:  No obvious focalities. Speech is fluent. Coordination is intact. Psychiatric: Judgment and insight are intact. Affect is appropriate.  KPS = 90  100 - Normal; no complaints; no evidence of disease. 90   - Able to carry on normal activity; minor signs or symptoms of disease. 80   - Normal activity with effort; some signs or symptoms of disease. 9   - Cares for self; unable to carry on normal activity or to do active work. 60   - Requires occasional assistance, but is able to care for most of his personal needs. 50   - Requires considerable assistance and frequent medical care. 68   - Disabled; requires special care and assistance. 30   - Severely disabled; hospital admission is  indicated although death not imminent. 20   - Very sick; hospital admission necessary; active supportive treatment necessary. 10   - Moribund; fatal processes progressing rapidly. 0     - Dead  Karnofsky DA, Abelmann WH, Craver LS and Burchenal JH (1948) The use of the nitrogen mustards in the palliative treatment of carcinoma: with particular reference to bronchogenic carcinoma Cancer 1 634-56  LABORATORY DATA:  Lab Results  Component Value Date   WBC 4.4 01/17/2021   HGB 10.0 (L) 01/17/2021   HCT 30.6 (L) 01/17/2021   MCV 92.2 01/17/2021   PLT 164 01/17/2021   CMP     Component Value Date/Time   NA 139 01/17/2021 1127   NA 141 08/26/2020 1546   K 3.8 01/17/2021 1127   CL 104 01/17/2021 1127   CO2 28 01/17/2021 1127   GLUCOSE 92 01/17/2021 1127   BUN 15 01/17/2021 1127   BUN 12 08/26/2020 1546   CREATININE 1.10 (H) 01/17/2021 1127   CALCIUM 8.9 01/17/2021 1127   PROT 6.9 01/17/2021 1127   PROT 6.8 08/26/2020 1546   ALBUMIN 3.6 01/17/2021 1127   ALBUMIN 4.1 08/26/2020 1546   AST 25 01/17/2021 1127   ALT 13 01/17/2021 1127   ALKPHOS 44 01/17/2021 1127    BILITOT 0.3 01/17/2021 1127   GFRNONAA 57 (L) 01/17/2021 1127   GFRAA >60 01/08/2020 1342   GFRAA >60 01/04/2020 0907         RADIOGRAPHY: MR BRAIN W WO CONTRAST  Result Date: 01/30/2021 CLINICAL DATA:  Brain metastases. Brain/CNS neoplasm, assess treatment response. Metastatic lung cancer status post SRS. EXAM: MRI HEAD WITHOUT AND WITH CONTRAST TECHNIQUE: Multiplanar, multiecho pulse sequences of the brain and surrounding structures were obtained without and with intravenous contrast. CONTRAST:  6mL GADAVIST GADOBUTROL 1 MMOL/ML IV SOLN COMPARISON:  Prior brain MRI examinations 10/10/2020 and earlier. FINDINGS: Brain: Mild generalized cerebral atrophy. Again demonstrated are multiple enhancing intracranial metastases, as follows. Stable 10 mm ring-enhancing lesion within the high medial left frontal lobe (series 1200, image 235). Stable mild surrounding T2 FLAIR hyperintensity. Stable adjacent punctate enhancing lesion within the high medial left frontal lobe (series 1200, image 236). Stable punctate enhancing lesion within the high anterior left frontal lobe (series 1200, image 214). Stable 8 mm ring-enhancing lesion within the medial right frontal lobe precentral gyrus (series 1200, image 232). Stable mild surrounding T2 FLAIR hyperintensity. Stable 3 mm enhancing lesion more laterally within the right frontal lobe precentral gyrus (series 1200, image 224). Stable 4 mm nodular enhancing cortical lesion within the right parietal lobe (series 1200, image 203). Stable 6 mm ring-enhancing lesion within the anterior right temporal lobe (series 1200, image 158). There is recurrent enhancement at site of a treated lesion within the anterior left frontal lobe, with enhancement measuring 5 mm on the current exam (series 1200, image 199). There is no appreciable surrounding edema. As before, there are chronic blood products associated with many of the above lesions. Background mild multifocal T2 FLAIR  hyperintense signal abnormality, likely reflecting a combination of chronic small vessel ischemic disease and post-treatment changes. There is no acute infarct. No extra-axial fluid collection. No midline shift. Vascular: Maintained flow voids within the proximal large arterial vessels. Skull and upper cervical spine: No focal suspicious marrow lesion. C5-C6 vertebral body ankylosis. Sinuses/Orbits: Visualized orbits show no acute finding. Trace scattered paranasal sinus mucosal thickening. Other: Small left mastoid effusion. IMPRESSION: Recurrent enhancement at site of a treated lesion within the anterior left frontal lobe, measuring   5 mm. Enhancement has not been present at this site since the prior brain MRI of 02/20/2020. No appreciable surrounding edema. The remaining intracranial metastases have remained stable since the prior MRI of 10/10/2020, as detailed. Electronically Signed   By: Kyle  Golden D.O.   On: 01/30/2021 12:10      IMPRESSION/PLAN: This is a very pleasant 60 year old woman with metastatic disease to the brain.  I had a lengthy discussion with the patient after reviewing their MRI results with them.  There is a single lesion that appears to demonstrate new disease in the left frontal lobe.  I recommend radiosurgery to this lesion.  She understands that the risks of radiosurgery may include but not necessarily be limited to headache, dizziness, edema in the brain, radionecrosis, possible necessity for medication or surgical treatment to address refractory disease and or inflammatory response to treatment.  Brain injury is a possibility but the risks of treatment are far decreased when we can treat the lesion although it is very small.  After lengthy discussion, the patient would like to proceed with stereotactic brain radiosurgery to their metastatic disease.  We will proceed with treatment planning and deliver her treatment next week   Anticipate 20 Gray in 1 fraction to the lesion in  the left frontal lobe.  Lorazepam given today, IM , to help her with claustrophobia while wearing mask.  On date of service, in total, I spent 30 minutes on this encounter. Patient was seen in person.  __________________________________________   Sarah Squire, MD  This document serves as a record of services personally performed by Sarah Squire, MD. It was created on her behalf by Elisa Frazier, a trained medical scribe. The creation of this record is based on the scribe's personal observations and the provider's statements to them. This document has been checked and approved by the attending provider.  

## 2021-02-04 ENCOUNTER — Encounter: Payer: Self-pay | Admitting: Radiation Oncology

## 2021-02-04 ENCOUNTER — Ambulatory Visit
Admission: RE | Admit: 2021-02-04 | Discharge: 2021-02-04 | Disposition: A | Discharge status: Home / Self Care | Payer: Medicaid Other | Source: Ambulatory Visit | Attending: Radiation Oncology | Admitting: Radiation Oncology

## 2021-02-04 ENCOUNTER — Ambulatory Visit: Payer: Medicaid Other

## 2021-02-04 ENCOUNTER — Ambulatory Visit: Payer: Medicaid Other | Admitting: Radiation Oncology

## 2021-02-04 VITALS — BP 170/79 | HR 85 | Temp 97.6°F | Resp 18 | Ht 65.5 in | Wt 147.0 lb

## 2021-02-04 DIAGNOSIS — C7931 Secondary malignant neoplasm of brain: Secondary | ICD-10-CM | POA: Insufficient documentation

## 2021-02-04 DIAGNOSIS — R410 Disorientation, unspecified: Secondary | ICD-10-CM | POA: Insufficient documentation

## 2021-02-04 DIAGNOSIS — Z87891 Personal history of nicotine dependence: Secondary | ICD-10-CM | POA: Insufficient documentation

## 2021-02-04 DIAGNOSIS — R270 Ataxia, unspecified: Secondary | ICD-10-CM | POA: Insufficient documentation

## 2021-02-04 DIAGNOSIS — Z510 Encounter for antineoplastic radiation therapy: Secondary | ICD-10-CM | POA: Insufficient documentation

## 2021-02-04 DIAGNOSIS — R110 Nausea: Secondary | ICD-10-CM | POA: Diagnosis not present

## 2021-02-04 DIAGNOSIS — C3491 Malignant neoplasm of unspecified part of right bronchus or lung: Secondary | ICD-10-CM | POA: Insufficient documentation

## 2021-02-04 DIAGNOSIS — R200 Anesthesia of skin: Secondary | ICD-10-CM | POA: Diagnosis not present

## 2021-02-04 DIAGNOSIS — R42 Dizziness and giddiness: Secondary | ICD-10-CM | POA: Insufficient documentation

## 2021-02-04 MED ORDER — LORAZEPAM 2 MG/ML IJ SOLN
1.0000 mg | Freq: Once | INTRAMUSCULAR | Status: AC
  Administered 2021-02-04: 1 mg via INTRAMUSCULAR

## 2021-02-04 NOTE — Progress Notes (Signed)
Location/Histology of Brain Tumor:  MRI Brain w/ & w/o Contrast 01/30/2021 --IMPRESSION: Recurrent enhancement at site of a treated lesion within the anterior left frontal lobe, measuring 5 mm. Enhancement has not been present at this site since the prior brain MRI of 02/20/2020. No appreciable surrounding edema. The remaining intracranial metastases have remained stable since the prior MRI of 10/10/2020, as detailed.  Dose of Decadron, if applicable: Not currently prescribed  Recent neurologic symptoms, if any:  Seizures: None Headaches: Reports occasional headaches (describes them as sharp/acute, and states they resolve on their own without need for OTC pain medication) Nausea: Denies any issues, but does report a decreased appetite  Dizziness/ataxia: Reports occasionally feeling dizzy/off balance when she stands, but denies any falls Difficulty with hand coordination: Patient denies Focal numbness/weakness: Reports mild weakness to her upper extremities and numbness/sharpness sensation to her feet/hands Visual deficits/changes: Continues to deal with "white floaters" and occasional difficulty focusing (states floaters resolve after she closes her eyes). Also reports some difficulty using her left eye because of a persistent eyelid droop Confusion/Memory deficits: Patient denies any new or worsening concerns  SAFETY ISSUES: Prior radiation?  01/12/2020 Site Technique Total Dose (Gy) Dose per Fx (Gy) Completed Fx Beam Energies               Brain: Brain_SRS IMRT 20/20 20 1/1 6XFFF   11/15/2019 through 12/06/2019 Site Technique Total Dose (Gy) Dose per Fx (Gy) Completed Fx Beam Energies  Lung, Right: Lung_Rt 3D 35/35 2.5 14/14 6X, 10X, 15X   Pacemaker/ICD? No Possible current pregnancy? No--postmenopausal Is the patient on methotrexate? No  Additional Complaints / other details: Nothing else of note

## 2021-02-05 DIAGNOSIS — Z510 Encounter for antineoplastic radiation therapy: Secondary | ICD-10-CM | POA: Diagnosis not present

## 2021-02-06 ENCOUNTER — Encounter: Payer: Self-pay | Admitting: Internal Medicine

## 2021-02-06 ENCOUNTER — Ambulatory Visit (INDEPENDENT_AMBULATORY_CARE_PROVIDER_SITE_OTHER): Payer: Medicaid Other | Admitting: Emergency Medicine

## 2021-02-06 ENCOUNTER — Encounter: Payer: Self-pay | Admitting: Nurse Practitioner

## 2021-02-06 ENCOUNTER — Encounter: Payer: Self-pay | Admitting: Emergency Medicine

## 2021-02-06 ENCOUNTER — Ambulatory Visit (INDEPENDENT_AMBULATORY_CARE_PROVIDER_SITE_OTHER): Payer: Medicaid Other | Admitting: Nurse Practitioner

## 2021-02-06 ENCOUNTER — Other Ambulatory Visit: Payer: Self-pay

## 2021-02-06 ENCOUNTER — Encounter: Payer: Self-pay | Admitting: Physician Assistant

## 2021-02-06 VITALS — BP 150/80 | HR 81 | Temp 98.3°F | Ht 65.0 in | Wt 146.2 lb

## 2021-02-06 DIAGNOSIS — Z0000 Encounter for general adult medical examination without abnormal findings: Secondary | ICD-10-CM

## 2021-02-06 DIAGNOSIS — J9611 Chronic respiratory failure with hypoxia: Secondary | ICD-10-CM | POA: Diagnosis not present

## 2021-02-06 DIAGNOSIS — Z0001 Encounter for general adult medical examination with abnormal findings: Secondary | ICD-10-CM

## 2021-02-06 DIAGNOSIS — R1084 Generalized abdominal pain: Secondary | ICD-10-CM

## 2021-02-06 DIAGNOSIS — Z01419 Encounter for gynecological examination (general) (routine) without abnormal findings: Secondary | ICD-10-CM

## 2021-02-06 DIAGNOSIS — J449 Chronic obstructive pulmonary disease, unspecified: Secondary | ICD-10-CM

## 2021-02-06 DIAGNOSIS — M542 Cervicalgia: Secondary | ICD-10-CM | POA: Diagnosis not present

## 2021-02-06 DIAGNOSIS — R21 Rash and other nonspecific skin eruption: Secondary | ICD-10-CM | POA: Diagnosis not present

## 2021-02-06 DIAGNOSIS — R030 Elevated blood-pressure reading, without diagnosis of hypertension: Secondary | ICD-10-CM

## 2021-02-06 DIAGNOSIS — Z86018 Personal history of other benign neoplasm: Secondary | ICD-10-CM

## 2021-02-06 DIAGNOSIS — C3491 Malignant neoplasm of unspecified part of right bronchus or lung: Secondary | ICD-10-CM | POA: Diagnosis not present

## 2021-02-06 DIAGNOSIS — C7931 Secondary malignant neoplasm of brain: Secondary | ICD-10-CM

## 2021-02-06 MED ORDER — CYCLOBENZAPRINE HCL 10 MG PO TABS: 10.0000 mg | ORAL_TABLET | Freq: Three times a day (TID) | ORAL | 0 refills | Status: DC | PRN

## 2021-02-06 NOTE — Assessment & Plan Note (Signed)
Please continue Symbicort 2 puffs twice a day.  Rinse and gargle after using. Keep your albuterol available use 2 puffs when needed for shortness of breath, chest tightness, wheezing. COVID-19 vaccine is up-to-date. Get the flu shot this fall.  Talked to oncology regarding the correct timing to get this vaccination around your chemotherapy treatments. Follow with Dr. Lamonte Sakai in 12 months or sooner if you have any problems.

## 2021-02-06 NOTE — Patient Instructions (Signed)
Please continue Symbicort 2 puffs twice a day.  Rinse and gargle after using. Keep your albuterol available use 2 puffs when needed for shortness of breath, chest tightness, wheezing. Wear your oxygen at 2 L/min while sleeping and also with exertion. COVID-19 vaccine is up-to-date. Get the flu shot this fall.  Talked to oncology regarding the correct timing to get this vaccination around your chemotherapy treatments. Follow with Dr. Lamonte Sakai in 12 months or sooner if you have any problems.

## 2021-02-06 NOTE — Assessment & Plan Note (Signed)
Wear your oxygen at 2 L/min while sleeping and also with exertion.

## 2021-02-06 NOTE — Patient Instructions (Signed)
Health Maintenance, Female Adopting a healthy lifestyle and getting preventive care are important in promoting health and wellness. Ask your health care provider about: The right schedule for you to have regular tests and exams. Things you can do on your own to prevent diseases and keep yourself healthy. What should I know about diet, weight, and exercise? Eat a healthy diet  Eat a diet that includes plenty of vegetables, fruits, low-fat dairy products, and lean protein. Do not eat a lot of foods that are high in solid fats, added sugars, or sodium. Maintain a healthy weight Body mass index (BMI) is used to identify weight problems. It estimates body fat based on height and weight. Your health care provider can help determine your BMI and help you achieve or maintain a healthy weight. Get regular exercise Get regular exercise. This is one of the most important things you can do for your health. Most adults should: Exercise for at least 150 minutes each week. The exercise should increase your heart rate and make you sweat (moderate-intensity exercise). Do strengthening exercises at least twice a week. This is in addition to the moderate-intensity exercise. Spend less time sitting. Even light physical activity can be beneficial. Watch cholesterol and blood lipids Have your blood tested for lipids and cholesterol at 62 years of age, then have this test every 5 years. Have your cholesterol levels checked more often if: Your lipid or cholesterol levels are high. You are older than 62 years of age. You are at high risk for heart disease. What should I know about cancer screening? Depending on your health history and family history, you may need to have cancer screening at various ages. This may include screening for: Breast cancer. Cervical cancer. Colorectal cancer. Skin cancer. Lung cancer. What should I know about heart disease, diabetes, and high blood pressure? Blood pressure and heart  disease High blood pressure causes heart disease and increases the risk of stroke. This is more likely to develop in people who have high blood pressure readings, are of African descent, or are overweight. Have your blood pressure checked: Every 3-5 years if you are 18-39 years of age. Every year if you are 40 years old or older. Diabetes Have regular diabetes screenings. This checks your fasting blood sugar level. Have the screening done: Once every three years after age 40 if you are at a normal weight and have a low risk for diabetes. More often and at a younger age if you are overweight or have a high risk for diabetes. What should I know about preventing infection? Hepatitis B If you have a higher risk for hepatitis B, you should be screened for this virus. Talk with your health care provider to find out if you are at risk for hepatitis B infection. Hepatitis C Testing is recommended for: Everyone born from 1945 through 1965. Anyone with known risk factors for hepatitis C. Sexually transmitted infections (STIs) Get screened for STIs, including gonorrhea and chlamydia, if: You are sexually active and are younger than 62 years of age. You are older than 62 years of age and your health care provider tells you that you are at risk for this type of infection. Your sexual activity has changed since you were last screened, and you are at increased risk for chlamydia or gonorrhea. Ask your health care provider if you are at risk. Ask your health care provider about whether you are at high risk for HIV. Your health care provider may recommend a prescription medicine   to help prevent HIV infection. If you choose to take medicine to prevent HIV, you should first get tested for HIV. You should then be tested every 3 months for as long as you are taking the medicine. Pregnancy If you are about to stop having your period (premenopausal) and you may become pregnant, seek counseling before you get  pregnant. Take 400 to 800 micrograms (mcg) of folic acid every day if you become pregnant. Ask for birth control (contraception) if you want to prevent pregnancy. Osteoporosis and menopause Osteoporosis is a disease in which the bones lose minerals and strength with aging. This can result in bone fractures. If you are 65 years old or older, or if you are at risk for osteoporosis and fractures, ask your health care provider if you should: Be screened for bone loss. Take a calcium or vitamin D supplement to lower your risk of fractures. Be given hormone replacement therapy (HRT) to treat symptoms of menopause. Follow these instructions at home: Lifestyle Do not use any products that contain nicotine or tobacco, such as cigarettes, e-cigarettes, and chewing tobacco. If you need help quitting, ask your health care provider. Do not use street drugs. Do not share needles. Ask your health care provider for help if you need support or information about quitting drugs. Alcohol use Do not drink alcohol if: Your health care provider tells you not to drink. You are pregnant, may be pregnant, or are planning to become pregnant. If you drink alcohol: Limit how much you use to 0-1 drink a day. Limit intake if you are breastfeeding. Be aware of how much alcohol is in your drink. In the [place] one drink equals one 12 oz bottle of beer (355 mL), one 5 oz glass of wine (148 mL), or one 1 oz glass of hard liquor (44 mL). General instructions Schedule regular health, dental, and eye exams. Stay current with your vaccines. Tell your health care provider if: You often feel depressed. You have ever been abused or do not feel safe at home. Summary Adopting a healthy lifestyle and getting preventive care are important in promoting health and wellness. Follow your health care provider's instructions about healthy diet, exercising, and getting tested or screened for diseases. Follow your health care provider's  instructions on monitoring your cholesterol and blood pressure. This information is not intended to replace advice given to you by your health care provider. Make sure you discuss any questions you have with your health care provider. Document Revised: 05/31/2020 Document Reviewed: 03/16/2018 Elsevier Patient Education  2022 Elsevier Inc.  

## 2021-02-06 NOTE — Assessment & Plan Note (Signed)
Following with Dr. Lorenso Courier and with radiation oncology.  Repeat imaging as per their plans.  Most recent imaging reviewed today.

## 2021-02-06 NOTE — Progress Notes (Signed)
Subjective:    Patient ID: Elizabeth Mayer, female    DOB: 1960/07/05, 60 y.o.   MRN: [id number]  HPI  ROV 02/26/20 --this follow-up visit for 60 year old woman with COPD/asthma. I initially met her to evaluate massive cervical and mediastinal lymphadenopathy. Lymph node biopsy showed metastatic lung adenocarcinoma, under treatment with chemoradiation. She had some increased shortness of breath, green mucus, chest tightness beginning about a week ago. I treated her with doxycycline but also asked her to be Covid tested which was negative 3 days ago. She reports that her cough has improved with the doxycycline.  She has O2 at night 2L/min. She is using it some to ambulate through the home as well.  She is using albuterol about 2-4x a day. She is on symbicort, flovent.   ROV 02/06/21 --pleasant 60 year old woman, former smoker with a history of childhood asthma, adult COPD/asthma.  I met her for evaluation of cervical mediastinal adenopathy that was diagnosed as metastatic adenocarcinoma of the lung.  Course complicated by brain metastases.  I treated her in the past for what appeared to be postobstructive pneumonia.  She has chronic hypoxemic respiratory failure and uses 2 L/min at night, and with exertion.  Maintenance medications include Symbicort.  She uses albuterol approximately 3-4x a day, has to use it more after she does chemo. She reports that she has some palpitations. She has exertional SOB with chores at home, better when she wears her O2. She has intermittent chest tightness that is relieved by the albuterol.  She is planning to undergo salvage brain radiation. COVID vaccine up to date.  Flu shot due.   CT CAP 12/16/20 reviewed by me, showed no evidence active disease in the chest or distant MRI brain does show a metastatic focus 01/30/21.    Review of Systems As per HPI     Objective:   Physical Exam  Today's Vitals   02/06/21 0916  BP: 126/64  Pulse: 82  Temp: 98.2 F (36.8  C)  TempSrc: Oral  SpO2: 100%  Weight: 147 lb 3.2 oz (66.8 kg)  Height: $Remove'5\' 5"'zoizfvJ$  (1.651 m)   Body mass index is 24.5 kg/m.;sm  Gen: Pleasant, well-nourished, in no distress,  normal affect  ENT: No lesions,  mouth clear,  oropharynx clear, no postnasal drip, soft voice, mild hoarseness  Neck: No JVD, no stridor, supraclav mass not palpable.   Lungs: No use of accessory muscles, no crackles or wheezing on normal respiration, no wheeze on forced expiration  Cardiovascular: RRR, heart sounds normal, no murmur or gallops, no peripheral edema  Musculoskeletal: No deformities, no cyanosis or clubbing  Neuro: alert, awake, non focal  Skin: Warm, no lesions or rash     Assessment & Plan:  Asthma with COPD ([place]) Please continue Symbicort 2 puffs twice a day.  Rinse and gargle after using. Keep your albuterol available use 2 puffs when needed for shortness of breath, chest tightness, wheezing. COVID-19 vaccine is up-to-date. Get the flu shot this fall.  Talked to oncology regarding the correct timing to get this vaccination around your chemotherapy treatments. Follow with Dr. Lamonte Sakai in 12 months or sooner if you have any problems.   Chronic respiratory failure with hypoxia ([place]) Wear your oxygen at 2 L/min while sleeping and also with exertion.  Non-small cell lung cancer, right [facility]) Following with Dr. Lorenso Courier and with radiation oncology.  Repeat imaging as per their plans.  Most recent imaging reviewed today.  Baltazar Apo, MD, PhD 02/06/2021, 9:33 AM  [facility] [phone] or if no answer [phone]

## 2021-02-06 NOTE — Progress Notes (Signed)
I,Yamilka J Llittleton,acting as a Education administrator for [employer], FNP.,have documented all relevant documentation on the behalf of Minette Brine, FNP,as directed by  Minette Brine, FNP while in the presence of Minette Brine, [place].   This visit occurred during the SARS-CoV-2 public health emergency.  Safety protocols were in place, including screening questions prior to the visit, additional usage of staff PPE, and extensive cleaning of exam room while observing appropriate contact time as indicated for disinfecting solutions.  Subjective:     Patient ID: Elizabeth Mayer , female    DOB: 18-Nov-1960 , 60 y.o.   MRN: [id number]   Chief Complaint  Patient presents with   Annual Exam    HPI  Patient here for hm  Wt Readings from Last 3 Encounters: 02/06/21 : 146 lb 3.2 oz (66.3 kg) 02/06/21 : 147 lb 3.2 oz (66.8 kg) 02/04/21 : 147 lb (66.7 kg)    They have now found a brain tumor and there are plans to radiate next week.  She continues with chemo for her neck but has been having allergy to the chemo.  She is going to check her oncologist about her flu vaccine.   She is no longer working. She is having dizzy spells and her judgement is off. She is out on disability lives alone.     Past Medical History:  Diagnosis Date   Anemia    Angina 1982   related to stress   Asthma    in the past,  no current problems   Complication of anesthesia    states anesthesia made her hair fall out   Constipation    Dyspnea    on oxygen at home - 2L via [place]   Fatigue    GERD (gastroesophageal reflux disease)    patient denies this dx   Headache    Heart murmur 1970s   no problems currently   Ingrown toenail    Lung cancer ([place]) 10/2019   metastatic disease to the brain   On home oxygen therapy    2.2 lpm, 24 hours a day   Past heart attack 1980-1981   pt states she passed out and woke up in hospital- told she had heart attack, but then dr said he couldn't find anything wrong.   Pneumonia    x 1      Family History  Problem Relation Age of Onset   Arthritis Mother    Heart failure Father      Current Outpatient Medications:    acetaminophen (TYLENOL) 500 MG tablet, Take 1,000 mg by mouth every 6 (six) hours as needed for moderate pain. , Disp: , Rfl:    benzoyl peroxide (CERAVE ACNE FOAMING CREAM) 4 % external liquid, Apply 1 application topically 2 (two) times daily. Face, Disp: , Rfl:    camphor-menthol (SARNA) lotion, Apply 1 application topically daily as needed for itching., Disp: , Rfl:    Carboxymethylcellulose Sodium (REFRESH CELLUVISC OP), Place 2 drops into both eyes See admin instructions. Instill 2  drop into both eyes twice daily (scheduled) & then use as needed throughout the day for dry/irritated eyes, Disp: , Rfl:    clindamycin (CLEOCIN T) 1 % lotion, Apply 1 application topically 2 (two) times daily., Disp: , Rfl:    Cyanocobalamin (B-12 COMPLIANCE INJECTION) 1000 MCG/ML KIT, Inject 1,000 mcg as directed See admin instructions. As needed give at Chemo, Disp: , Rfl:    cyclobenzaprine (FLEXERIL) 10 MG tablet, Take 1 tablet (10 mg total) by  mouth 3 (three) times daily as needed for muscle spasms., Disp: 30 tablet, Rfl: 0   fexofenadine (ALLEGRA) 180 MG tablet, Take 180 mg by mouth in the morning., Disp: , Rfl:    fluticasone (FLONASE) 50 MCG/ACT nasal spray, Place 1 spray into both nostrils daily., Disp: , Rfl:    folic acid (FOLVITE) 1 MG tablet, Take 1 tablet (1 mg total) by mouth daily. (Patient taking differently: Take 1 mg by mouth in the morning.), Disp: 90 tablet, Rfl: 2   guaiFENesin (MUCINEX) 600 MG 12 hr tablet, Take 600 mg by mouth 2 (two) times daily. , Disp: , Rfl:    lidocaine (XYLOCAINE) 2 % solution, Use as directed 15 mLs in the mouth or throat as needed for mouth pain. Swallow 30 min prior to meals and at bedtime for throat/esophagus pain, Disp: 250 mL, Rfl: 1   lidocaine-prilocaine (EMLA) cream, Apply 1 application topically as needed. (Patient taking  differently: Apply 1 application topically as needed (port access). Every three weeks), Disp: 30 g, Rfl: 2   Multiple Vitamin (MULTIVITAMIN WITH MINERALS) TABS tablet, Take 2 tablets by mouth daily. Alive/ gummies, Disp: , Rfl:    OXYGEN, Inhale 3 L/min into the lungs continuous., Disp: , Rfl:    potassium chloride 20 MEQ/15ML (10%) SOLN, Take 15 mLs (20 mEq total) by mouth 2 (two) times daily. (Patient taking differently: Take 20 mEq by mouth daily as needed (Low patassium).), Disp: 210 mL, Rfl: 0   PROAIR HFA 108 (90 Base) MCG/ACT inhaler, INHALE 2 PUFFS BY MOUTH EVERY 6 HOURS AS NEEDED FOR WHEEZING OR SHORTNESS OF BREATH (Patient taking differently: Inhale 2 puffs into the lungs See admin instructions. Take twice a day and as many time as needed during the day), Disp: 9 g, Rfl: 0   prochlorperazine (COMPAZINE) 10 MG tablet, Take 1 tablet (10 mg total) by mouth every 6 (six) hours as needed. (Patient taking differently: Take 10 mg by mouth every 6 (six) hours as needed (nausea with chemo).), Disp: 30 tablet, Rfl: 2   Skin Protectants, Misc. (EUCERIN) cream, Apply 1 application topically as needed (Ezcema)., Disp: , Rfl:    Soap & Cleansers (MEDERMA AG FACIAL TONER) LIQD, Apply 1 application topically 2 (two) times daily. Advance dry skin, Disp: , Rfl:    SYMBICORT 80-4.5 MCG/ACT inhaler, INHALE 2 PUFFS BY MOUTH IN THE MORNING AND AT BEDTIME, Disp: 11 g, Rfl: 0   triamcinolone ointment (KENALOG) 0.1 %, Apply 1 application topically in the morning and at bedtime., Disp: , Rfl:    HYDROcodone-acetaminophen (NORCO/VICODIN) 5-325 MG tablet, Take 1 tablet by mouth every 6 (six) hours as needed for moderate pain., Disp: 30 tablet, Rfl: 0   ondansetron (ZOFRAN) 8 MG tablet, Take 1 tablet (8 mg total) by mouth every 8 (eight) hours as needed for nausea or vomiting., Disp: 20 tablet, Rfl: 0   Allergies  Allergen Reactions   Pseudoephedrine Hypertension   Contrast Media [Iodinated Diagnostic Agents] Other  (See Comments)    Burning/dizzy Think it was a change in the brand never had a problem before   Gabapentin Other (See Comments)    Raise blood pressure and red rings around eyes Blood vessels popped in her eyes   Mobic [Meloxicam] Swelling    Inflamed the area that has inflammation and stabbing pains in the area   Penicillins Hives    Reaction: Childhood      The patient states she is post menopausal status for birth control. Last LMP  was No LMP recorded (lmp unknown). Patient is postmenopausal.. Negative for Dysmenorrhea and Negative for Menorrhagia. Negative for: breast discharge, breast lump(s), breast pain and breast self exam. Associated symptoms include abnormal vaginal bleeding. Pertinent negatives include abnormal bleeding (hematology), anxiety, decreased libido, depression, difficulty falling sleep, dyspareunia, history of infertility, nocturia, sexual dysfunction, sleep disturbances, urinary incontinence, urinary urgency, vaginal discharge and vaginal itching. Diet regular; appetite is here and there especially after chemo.The patient states her exercise level is minimal - she is trying to stay active.   . The patient's tobacco use is:  Social History   Tobacco Use  Smoking Status Former   Packs/day: 1.00   Years: 20.00   Pack years: 20.00   Types: Cigarettes   Start date: 22   Quit date: 2001   Years since quitting: 21.8  Smokeless Tobacco Never  . She has been exposed to passive smoke. The patient's alcohol use is:  Social History   Substance and Sexual Activity  Alcohol Use No   Additional information: Last pap unknown, next one scheduled for will refer to GYN.    Review of Systems  Constitutional: Negative.   HENT: Negative.    Eyes: Negative.   Respiratory: Negative.    Cardiovascular: Negative.   Gastrointestinal: Negative.   Endocrine: Negative.   Genitourinary: Negative.   Musculoskeletal: Negative.   Skin: Negative.   Allergic/Immunologic: Negative.    Neurological: Negative.   Hematological: Negative.   Psychiatric/Behavioral: Negative.      Today's Vitals   02/06/21 1149  BP: (!) 150/80  Pulse: 81  Temp: 98.3 F (36.8 C)  Weight: 146 lb 3.2 oz (66.3 kg)  Height: 5' 5"  (1.651 m)  PainSc: 5   PainLoc: Neck   Body mass index is 24.33 kg/m.   Objective:  Physical Exam Vitals reviewed.  Constitutional:      General: She is not in acute distress.    Appearance: Normal appearance. She is well-developed.  HENT:     Head: Normocephalic and atraumatic.     Right Ear: Hearing, tympanic membrane, ear canal and external ear normal. There is no impacted cerumen.     Left Ear: Hearing, tympanic membrane, ear canal and external ear normal. There is no impacted cerumen.     Nose:     Comments: Deferred - masked    Mouth/Throat:     Comments: Deferred - masked Eyes:     General: Lids are normal.     Extraocular Movements: Extraocular movements intact.     Conjunctiva/sclera: Conjunctivae normal.     Pupils: Pupils are equal, round, and reactive to light.     Funduscopic exam:    Right eye: No papilledema.        Left eye: No papilledema.  Neck:     Thyroid: No thyroid mass.     Vascular: No carotid bruit.  Cardiovascular:     Rate and Rhythm: Normal rate and regular rhythm.     Pulses: Normal pulses.     Heart sounds: Normal heart sounds. No murmur heard. Pulmonary:     Effort: Pulmonary effort is normal. No respiratory distress.     Breath sounds: Normal breath sounds. No wheezing.  Chest:     Chest wall: No mass.  Breasts:    Tanner Score is 5.     Right: Normal. No mass or tenderness.     Left: Normal. No mass or tenderness.  Abdominal:     General: Abdomen is flat. Bowel sounds are normal.  There is no distension.     Palpations: Abdomen is soft.     Tenderness: There is no abdominal tenderness.  Genitourinary:    Rectum: Guaiac result negative.  Musculoskeletal:        General: No swelling. Normal range of  motion.     Cervical back: Full passive range of motion without pain, normal range of motion and neck supple.     Right lower leg: No edema.     Left lower leg: No edema.  Lymphadenopathy:     Upper Body:     Right upper body: No supraclavicular, axillary or pectoral adenopathy.     Left upper body: No supraclavicular, axillary or pectoral adenopathy.  Skin:    General: Skin is warm and dry.     Capillary Refill: Capillary refill takes less than 2 seconds.     Findings: Rash present.  Neurological:     General: No focal deficit present.     Mental Status: She is alert and oriented to person, place, and time.     Cranial Nerves: No cranial nerve deficit.     Sensory: No sensory deficit.  Psychiatric:        Mood and Affect: Mood normal.        Behavior: Behavior normal.        Thought Content: Thought content normal.        Judgment: Judgment normal.        Assessment And Plan:     1. Encounter for general adult medical examination w/o abnormal findings Behavior modifications discussed and diet history reviewed.   Pt will continue to exercise regularly and modify diet with low GI, plant based foods and decrease intake of processed foods.  Recommend intake of daily multivitamin, Vitamin D, and calcium.  Recommend mammogram and colonoscopy (up to date) for preventive screenings, as well as recommend immunizations that include influenza, TDAP, and Shingles (she is to speak with her Oncologist about her vaccines.)  2. Generalized abdominal pain Comments: She is having persistent abdomen pain, she has a history of a fibroid will refer to GYN - Ambulatory referral to Gynecology  3. Non-small cell lung cancer, right ([place]) Comments: Continue follow up with Oncology  4. Brain metastases ([place]) Comments: Continue follow up with Oncology  5. History of uterine fibroid - Ambulatory referral to Gynecology  6. Encounter for gynecological examination  7. Rash and nonspecific skin  eruption - Ambulatory referral to Dermatology  8. Elevated blood-pressure reading without diagnosis of hypertension Comments: Blood pressure today is slightly elevated, encouraged to avoid high salt foods.   9. Neck pain Comments: she will try 1000 mg of tylenol daily.  - cyclobenzaprine (FLEXERIL) 10 MG tablet; Take 1 tablet (10 mg total) by mouth 3 (three) times daily as needed for muscle spasms.  Dispense: 30 tablet; Refill: 0     Patient was given opportunity to ask questions. Patient verbalized understanding of the plan and was able to repeat key elements of the plan. All questions were answered to their satisfaction.   Minette Brine, FNP   I, Minette Brine, FNP, have reviewed all documentation for this visit. The documentation on 02/14/21 for the exam, diagnosis, procedures, and orders are all accurate and complete.  THE PATIENT IS ENCOURAGED TO PRACTICE SOCIAL DISTANCING DUE TO THE COVID-19 PANDEMIC.

## 2021-02-07 ENCOUNTER — Inpatient Hospital Stay: Payer: Medicaid Other | Attending: Internal Medicine

## 2021-02-07 ENCOUNTER — Inpatient Hospital Stay (HOSPITAL_BASED_OUTPATIENT_CLINIC_OR_DEPARTMENT_OTHER): Payer: Medicaid Other | Admitting: Hematology and Oncology

## 2021-02-07 ENCOUNTER — Inpatient Hospital Stay: Payer: Medicaid Other

## 2021-02-07 VITALS — BP 149/74 | HR 78 | Temp 98.4°F | Resp 17 | Wt 146.8 lb

## 2021-02-07 DIAGNOSIS — Z5112 Encounter for antineoplastic immunotherapy: Secondary | ICD-10-CM | POA: Diagnosis present

## 2021-02-07 DIAGNOSIS — C3491 Malignant neoplasm of unspecified part of right bronchus or lung: Secondary | ICD-10-CM

## 2021-02-07 DIAGNOSIS — C7931 Secondary malignant neoplasm of brain: Secondary | ICD-10-CM | POA: Diagnosis not present

## 2021-02-07 DIAGNOSIS — Z95828 Presence of other vascular implants and grafts: Secondary | ICD-10-CM | POA: Diagnosis not present

## 2021-02-07 DIAGNOSIS — C779 Secondary and unspecified malignant neoplasm of lymph node, unspecified: Secondary | ICD-10-CM | POA: Diagnosis not present

## 2021-02-07 DIAGNOSIS — Z23 Encounter for immunization: Secondary | ICD-10-CM | POA: Diagnosis not present

## 2021-02-07 DIAGNOSIS — Z5111 Encounter for antineoplastic chemotherapy: Secondary | ICD-10-CM | POA: Diagnosis not present

## 2021-02-07 DIAGNOSIS — E538 Deficiency of other specified B group vitamins: Secondary | ICD-10-CM | POA: Diagnosis not present

## 2021-02-07 LAB — TSH: TSH: 1.471 u[IU]/mL (ref 0.308–3.960)

## 2021-02-07 LAB — CBC WITH DIFFERENTIAL (CANCER CENTER ONLY)
Abs Immature Granulocytes: 0.01 10*3/uL (ref 0.00–0.07)
Basophils Absolute: 0 10*3/uL (ref 0.0–0.1)
Basophils Relative: 1 %
Eosinophils Absolute: 0.1 10*3/uL (ref 0.0–0.5)
Eosinophils Relative: 2 %
HCT: 29.9 % — ABNORMAL LOW (ref 36.0–46.0)
Hemoglobin: 9.5 g/dL — ABNORMAL LOW (ref 12.0–15.0)
Immature Granulocytes: 0 %
Lymphocytes Relative: 18 %
Lymphs Abs: 0.8 10*3/uL (ref 0.7–4.0)
MCH: 30 pg (ref 26.0–34.0)
MCHC: 31.8 g/dL (ref 30.0–36.0)
MCV: 94.3 fL (ref 80.0–100.0)
Monocytes Absolute: 0.5 10*3/uL (ref 0.1–1.0)
Monocytes Relative: 12 %
Neutro Abs: 3 10*3/uL (ref 1.7–7.7)
Neutrophils Relative %: 67 %
Platelet Count: 182 10*3/uL (ref 150–400)
RBC: 3.17 MIL/uL — ABNORMAL LOW (ref 3.87–5.11)
RDW: 15 % (ref 11.5–15.5)
WBC Count: 4.5 10*3/uL (ref 4.0–10.5)
nRBC: 0 % (ref 0.0–0.2)

## 2021-02-07 LAB — CMP (CANCER CENTER ONLY)
ALT: 12 U/L (ref 0–44)
AST: 20 U/L (ref 15–41)
Albumin: 3.3 g/dL — ABNORMAL LOW (ref 3.5–5.0)
Alkaline Phosphatase: 47 U/L (ref 38–126)
Anion gap: 8 (ref 5–15)
BUN: 15 mg/dL (ref 8–23)
CO2: 27 mmol/L (ref 22–32)
Calcium: 8.7 mg/dL — ABNORMAL LOW (ref 8.9–10.3)
Chloride: 108 mmol/L (ref 98–111)
Creatinine: 0.87 mg/dL (ref 0.44–1.00)
GFR, Estimated: >60 mL/min (ref >60)
Glucose, Bld: 80 mg/dL (ref 70–99)
Potassium: 3.8 mmol/L (ref 3.5–5.1)
Sodium: 143 mmol/L (ref 135–145)
Total Bilirubin: <0.2 mg/dL — ABNORMAL LOW (ref 0.3–1.2)
Total Protein: 6.4 g/dL — ABNORMAL LOW (ref 6.5–8.1)

## 2021-02-07 LAB — MAGNESIUM: Magnesium: 2 mg/dL (ref 1.7–2.4)

## 2021-02-07 MED ORDER — HEPARIN SOD (PORK) LOCK FLUSH 100 UNIT/ML IV SOLN: 500.0000 [IU] | Freq: Once | INTRAVENOUS | Status: DC | PRN

## 2021-02-07 MED ORDER — HEPARIN SOD (PORK) LOCK FLUSH 100 UNIT/ML IV SOLN
500.0000 [IU] | Freq: Once | INTRAVENOUS | Status: AC
  Administered 2021-02-07: 500 [IU] via INTRAVENOUS

## 2021-02-07 MED ORDER — CYANOCOBALAMIN 1000 MCG/ML IJ SOLN
1000.0000 ug | Freq: Once | INTRAMUSCULAR | Status: AC
  Administered 2021-02-07: 1000 ug via INTRAMUSCULAR
  Filled 2021-02-07: qty 1

## 2021-02-07 MED ORDER — SODIUM CHLORIDE 0.9% FLUSH: 10.0000 mL | INTRAVENOUS | Status: DC | PRN

## 2021-02-07 MED ORDER — SODIUM CHLORIDE 0.9% FLUSH
10.0000 mL | INTRAVENOUS | Status: DC | PRN
  Administered 2021-02-07: 10 mL

## 2021-02-07 MED ORDER — ALTEPLASE 2 MG IJ SOLR: 2.0000 mg | Freq: Once | INTRAMUSCULAR | Status: DC | PRN

## 2021-02-07 MED ORDER — SODIUM CHLORIDE 0.9 % IV SOLN
500.0000 mg/m2 | Freq: Once | INTRAVENOUS | Status: AC
  Administered 2021-02-07: 900 mg via INTRAVENOUS
  Filled 2021-02-07: qty 20

## 2021-02-07 MED ORDER — SODIUM CHLORIDE 0.9% FLUSH
10.0000 mL | Freq: Once | INTRAVENOUS | Status: AC
  Administered 2021-02-07: 10 mL

## 2021-02-07 MED ORDER — HYDROCODONE-ACETAMINOPHEN 5-325 MG PO TABS
1.0000 | ORAL_TABLET | Freq: Four times a day (QID) | ORAL | 0 refills | Status: DC | PRN
Start: 2021-02-07 — End: 2023-09-19

## 2021-02-07 MED ORDER — SODIUM CHLORIDE 0.9 % IV SOLN: Freq: Once | INTRAVENOUS | Status: AC

## 2021-02-07 MED ORDER — PROCHLORPERAZINE MALEATE 10 MG PO TABS
10.0000 mg | ORAL_TABLET | Freq: Once | ORAL | Status: AC
  Administered 2021-02-07: 10 mg via ORAL
  Filled 2021-02-07: qty 1

## 2021-02-07 MED ORDER — SODIUM CHLORIDE 0.9 % IV SOLN
200.0000 mg | Freq: Once | INTRAVENOUS | Status: AC
  Administered 2021-02-07: 200 mg via INTRAVENOUS
  Filled 2021-02-07: qty 8

## 2021-02-07 MED ORDER — INFLUENZA VAC SPLIT QUAD 0.5 ML IM SUSY
0.5000 mL | PREFILLED_SYRINGE | Freq: Once | INTRAMUSCULAR | Status: AC
  Administered 2021-02-07: 0.5 mL via INTRAMUSCULAR

## 2021-02-07 NOTE — Patient Instructions (Signed)
[facility]  Discharge Instructions: Thank you for choosing [facility] to provide your oncology and hematology care.   If you have a lab appointment with the [facility], please go directly to the [facility] and check in at the registration area.   Wear comfortable clothing and clothing appropriate for easy access to any Portacath or PICC line.   We strive to give you quality time with your provider. You may need to reschedule your appointment if you arrive late (15 or more minutes).  Arriving late affects you and other patients whose appointments are after yours.  Also, if you miss three or more appointments without notifying the office, you may be dismissed from the clinic at the provider's discretion.      For prescription refill requests, have your pharmacy contact our office and allow 72 hours for refills to be completed.    Today you received the following chemotherapy and/or immunotherapy agents:  Pembrolizumab,  Pemetrexed.   To help prevent nausea and vomiting after your treatment, we encourage you to take your nausea medication as directed.  BELOW ARE SYMPTOMS THAT SHOULD BE REPORTED IMMEDIATELY: *FEVER GREATER THAN 100.4 F (38 C) OR HIGHER *CHILLS OR SWEATING *NAUSEA AND VOMITING THAT IS NOT CONTROLLED WITH YOUR NAUSEA MEDICATION *UNUSUAL SHORTNESS OF BREATH *UNUSUAL BRUISING OR BLEEDING *URINARY PROBLEMS (pain or burning when urinating, or frequent urination) *BOWEL PROBLEMS (unusual diarrhea, constipation, pain near the anus) TENDERNESS IN MOUTH AND THROAT WITH OR WITHOUT PRESENCE OF ULCERS (sore throat, sores in mouth, or a toothache) UNUSUAL RASH, SWELLING OR PAIN  UNUSUAL VAGINAL DISCHARGE OR ITCHING   Items with * indicate a potential emergency and should be followed up as soon as possible or go to the Emergency Department if any problems should occur.  Please show the CHEMOTHERAPY ALERT CARD or IMMUNOTHERAPY ALERT CARD  at check-in to the Emergency Department and triage nurse.  Should you have questions after your visit or need to cancel or reschedule your appointment, please contact [facility]  Dept: [phone]  and follow the prompts.  Office hours are 8:00 a.m. to 4:30 p.m. Monday - Friday. Please note that voicemails left after 4:00 p.m. may not be returned until the following business day.  We are closed weekends and major holidays. You have access to a nurse at all times for urgent questions. Please call the main number to the clinic Dept: [phone] and follow the prompts.   For any non-urgent questions, you may also contact your provider using MyChart. We now offer e-Visits for anyone 45 and older to request care online for non-urgent symptoms. For details visit [email or web address].   Also download the MyChart app! Go to the app store, search "MyChart", open the app, select [facility], and log in with your MyChart username and password.  Due to Covid, a mask is required upon entering the hospital/clinic. If you do not have a mask, one will be given to you upon arrival. For doctor visits, patients may have 1 support person aged 70 or older with them. For treatment visits, patients cannot have anyone with them due to current Covid guidelines and our immunocompromised population.

## 2021-02-07 NOTE — Progress Notes (Signed)
[facility] Telephone:([phone]) [phone]   [phone]  PROGRESS NOTE  Patient Care Team: Minette Brine, FNP as PCP - General (General Practice)  Hematological/Oncological History # Metastatic Adenocarcinoma of the Lung #Brain Metastasis 1) 11/08/2019: establish care with Dr. Julien Nordmann and his PA Cassie Heilingoetter. Noted to have widely metastatic lung cancer with involvement of the brain, lymph nodes, and right lung.  2) 12/04/2019: palliative radiation to the right lung mass/cervical adenopathy 3) 9/7/02021: started therapy with Carbo/Pem/Pem 4) 9/29-10/09/2019: patient underwent SRS to the brain mets 5) 03/22/2020: repeat CT C/A/P and neck scheduled. Transfer care to Dr. Lorenso Courier.   6) 03/22/2020: CT C/A/P showed interval significant improvement in previously demonstrated extensive confluent lymphadenopathy in the superior mediastinum and right hilar regions. 7) 08/15/2020: Holding chemotherapy due to poor Hgb and fatigue. Allowing patient a brief 'chemo holiday' to rebound appropriately from last cycle.  8) 08/29/2020: restarted chemotherapy, Cycle 12 Day 1 9) 09/19/2020: patient requested to HOLD Cycle 13 in setting of fatigue/weakness.  10) 11/29/2020: Cycle 14 Day 1 of chemotherapy, delayed start due to insurance issues.   Interval History:  Elizabeth Mayer 60 y.o. female with medical history significant for metastatic adenocarcinoma of the lung who presents for follow up. The patient's last visit was on 01/17/2021 when she received Cycle 16, Day 1 of pem/pem maintenance.  In the interim since her last visit she has had no other changed in her health.   On exam today Elizabeth Mayer reports she has been well overall in the interim since her last visit.  She reports that she has been having some difficulty with a tingling sensation in her ribs sometimes worked its way down to her mid thighs.  She reports it is an uncomfortable feeling in describes as "irritating".  She notes that  she tries not to move when this happens and that the sensation of her skin crawling improves.  She notes that she did try to decrease her oxygen but is currently back on oxygen at her 3 L baseline.  Her weight has been stable though her appetite has been so-so.   She denies any fevers, chills, night sweats, chest pain or cough. She has no other complaints.  A full 10 point ROS is listed below.  MEDICAL HISTORY:  Past Medical History:  Diagnosis Date   Anemia    Angina 1982   related to stress   Asthma    in the past,  no current problems   Complication of anesthesia    states anesthesia made her hair fall out   Constipation    Dyspnea    on oxygen at home - 2L via [place]   Fatigue    GERD (gastroesophageal reflux disease)    patient denies this dx   Headache    Heart murmur 1970s   no problems currently   Ingrown toenail    Lung cancer ([facility]) 10/2019   metastatic disease to the brain   On home oxygen therapy    2.2 lpm, 24 hours a day   Past heart attack 1980-1981   pt states she passed out and woke up in hospital- told she had heart attack, but then dr said he couldn't find anything wrong.   Pneumonia    x 1    SURGICAL HISTORY: Past Surgical History:  Procedure Laterality Date   CERVICAL DISC SURGERY  2000   Disc removed from neck    ingrown toe nail surgery Bilateral    IR IMAGING GUIDED  PORT INSERTION  02/16/2020   MULTIPLE TOOTH EXTRACTIONS     for braces   RADIOLOGY WITH ANESTHESIA N/A 12/05/2019   Procedure: MRI BRAIN WITH AND WITHOUT CONTRAST;  Surgeon: Radiologist, Medication, MD;  Location: [facility];  Service: Radiology;  Laterality: N/A;   RADIOLOGY WITH ANESTHESIA N/A 01/02/2020   Procedure: MRI BRAIN WITH AND WITHOUT CONTRAST;  Surgeon: Radiologist, Medication, MD;  Location: [facility];  Service: Radiology;  Laterality: N/A;   RADIOLOGY WITH ANESTHESIA N/A 02/20/2020   Procedure: MRI WITH ANESTHESIA OF BRAIN WITH AND WITHOUT CONTRAST;  Surgeon: Radiologist, Medication,  MD;  Location: [facility];  Service: Radiology;  Laterality: N/A;   RADIOLOGY WITH ANESTHESIA N/A 06/11/2020   Procedure: MRI WITH ANESTHESIA OF BRAIN WITH AND WITHOUT CONTRAST;  Surgeon: Radiologist, Medication, MD;  Location: [facility];  Service: Radiology;  Laterality: N/A;   RADIOLOGY WITH ANESTHESIA N/A 10/10/2020   Procedure: MRI WITH ANESTHESIA BRAIN WITH AND WITHOUT CONTRAST;  Surgeon: Radiologist, Medication, MD;  Location: [facility];  Service: Radiology;  Laterality: N/A;   RADIOLOGY WITH ANESTHESIA N/A 01/30/2021   Procedure: MRI BRAIN WITH AND WITHOUT CONTRASTWITH ANESTHESIA;  Surgeon: Radiologist, Medication, MD;  Location: [facility];  Service: Radiology;  Laterality: N/A;   TONSILLECTOMY      SOCIAL HISTORY: Social History   Socioeconomic History   Marital status: Legally Separated    Spouse name: Not on file   Number of children: 0   Years of education: Not on file   Highest education level: Not on file  Occupational History   Occupation: Secondary school teacher  Tobacco Use   Smoking status: Former    Packs/day: 1.00    Years: 20.00    Pack years: 20.00    Types: Cigarettes    Start date: 31    Quit date: 2001    Years since quitting: 21.8   Smokeless tobacco: Never  Vaping Use   Vaping Use: Never used  Substance and Sexual Activity   Alcohol use: No   Drug use: No   Sexual activity: Yes    Birth control/protection: None, Post-menopausal  Other Topics Concern   Not on file  Social History Narrative   Not on file   Social Determinants of Health   Financial Resource Strain: Not on file  Food Insecurity: Not on file  Transportation Needs: Not on file  Physical Activity: Not on file  Stress: Not on file  Social Connections: Not on file  Intimate Partner Violence: Not on file    FAMILY HISTORY: Family History  Problem Relation Age of Onset   Arthritis Mother    Heart failure Father     ALLERGIES:  is allergic to pseudoephedrine, contrast media [iodinated diagnostic  agents], gabapentin, mobic [meloxicam], and penicillins.  MEDICATIONS:  Current Outpatient Medications  Medication Sig Dispense Refill   HYDROcodone-acetaminophen (NORCO/VICODIN) 5-325 MG tablet Take 1 tablet by mouth every 6 (six) hours as needed for moderate pain. 30 tablet 0   acetaminophen (TYLENOL) 500 MG tablet Take 1,000 mg by mouth every 6 (six) hours as needed for moderate pain.      benzoyl peroxide (CERAVE ACNE FOAMING CREAM) 4 % external liquid Apply 1 application topically 2 (two) times daily. Face     camphor-menthol (SARNA) lotion Apply 1 application topically daily as needed for itching.     Carboxymethylcellulose Sodium (REFRESH CELLUVISC OP) Place 2 drops into both eyes See admin instructions. Instill 2  drop into both eyes twice daily (scheduled) & then use as  needed throughout the day for dry/irritated eyes     clindamycin (CLEOCIN T) 1 % lotion Apply 1 application topically 2 (two) times daily.     Cyanocobalamin (B-12 COMPLIANCE INJECTION) 1000 MCG/ML KIT Inject 1,000 mcg as directed See admin instructions. As needed give at Chemo     cyclobenzaprine (FLEXERIL) 10 MG tablet Take 1 tablet (10 mg total) by mouth 3 (three) times daily as needed for muscle spasms. 30 tablet 0   fexofenadine (ALLEGRA) 180 MG tablet Take 180 mg by mouth in the morning.     fluticasone (FLONASE) 50 MCG/ACT nasal spray Place 1 spray into both nostrils daily.     folic acid (FOLVITE) 1 MG tablet Take 1 tablet (1 mg total) by mouth daily. (Patient taking differently: Take 1 mg by mouth in the morning.) 90 tablet 2   guaiFENesin (MUCINEX) 600 MG 12 hr tablet Take 600 mg by mouth 2 (two) times daily.      lidocaine (XYLOCAINE) 2 % solution Use as directed 15 mLs in the mouth or throat as needed for mouth pain. Swallow 30 min prior to meals and at bedtime for throat/esophagus pain 250 mL 1   lidocaine-prilocaine (EMLA) cream Apply 1 application topically as needed. (Patient taking differently: Apply 1  application topically as needed (port access). Every three weeks) 30 g 2   Multiple Vitamin (MULTIVITAMIN WITH MINERALS) TABS tablet Take 2 tablets by mouth daily. Alive/ gummies     OXYGEN Inhale 3 L/min into the lungs continuous.     potassium chloride 20 MEQ/15ML (10%) SOLN Take 15 mLs (20 mEq total) by mouth 2 (two) times daily. (Patient taking differently: Take 20 mEq by mouth daily as needed (Low patassium).) 210 mL 0   PROAIR HFA 108 (90 Base) MCG/ACT inhaler INHALE 2 PUFFS BY MOUTH EVERY 6 HOURS AS NEEDED FOR WHEEZING OR SHORTNESS OF BREATH (Patient taking differently: Inhale 2 puffs into the lungs See admin instructions. Take twice a day and as many time as needed during the day) 9 g 0   prochlorperazine (COMPAZINE) 10 MG tablet Take 1 tablet (10 mg total) by mouth every 6 (six) hours as needed. (Patient taking differently: Take 10 mg by mouth every 6 (six) hours as needed (nausea with chemo).) 30 tablet 2   Skin Protectants, Misc. (EUCERIN) cream Apply 1 application topically as needed (Ezcema).     Soap & Cleansers (MEDERMA AG FACIAL TONER) LIQD Apply 1 application topically 2 (two) times daily. Advance dry skin     SYMBICORT 80-4.5 MCG/ACT inhaler INHALE 2 PUFFS BY MOUTH IN THE MORNING AND AT BEDTIME 11 g 0   triamcinolone ointment (KENALOG) 0.1 % Apply 1 application topically in the morning and at bedtime.     No current facility-administered medications for this visit.   Facility-Administered Medications Ordered in Other Visits  Medication Dose Route Frequency Provider Last Rate Last Admin   alteplase (CATHFLO ACTIVASE) injection 2 mg  2 mg Intracatheter Once PRN Ledell Peoples IV, MD       heparin lock flush 100 unit/mL  500 Units Intravenous Once Ledell Peoples IV, MD       sodium chloride flush (NS) 0.9 % injection 10 mL  10 mL Intracatheter PRN Ledell Peoples IV, MD   10 mL at 02/07/21 1525   sodium chloride flush (NS) 0.9 % injection 10 mL  10 mL Intravenous PRN Orson Slick, MD        REVIEW OF SYSTEMS:  Constitutional: ( - ) fevers, ( - )  chills , ( - ) night sweats Eyes: ( - ) blurriness of vision, ( - ) double vision, ( - ) watery eyes Ears, nose, mouth, throat, and face: ( - ) mucositis, ( - ) sore throat Respiratory: ( - ) cough, ( - ) dyspnea, ( - ) wheezes Cardiovascular: ( - ) palpitation, ( - ) chest discomfort, ( - ) lower extremity swelling Gastrointestinal:  ( - ) nausea, ( - ) heartburn, ( - ) change in bowel habits Skin: ( - ) abnormal skin rashes Lymphatics: ( - ) new lymphadenopathy, ( - ) easy bruising Neurological: ( - ) numbness, ( - ) tingling, ( - ) new weaknesses Behavioral/Psych: ( - ) mood change, ( - ) new changes  All other systems were reviewed with the patient and are negative.  PHYSICAL EXAMINATION: ECOG PERFORMANCE STATUS: 3 - Symptomatic, >50% confined to bed  Vitals:   02/07/21 1143  BP: (!) 149/74  Pulse: 78  Resp: 17  Temp: 98.4 F (36.9 C)  SpO2: 100%    Filed Weights   02/07/21 1143  Weight: 146 lb 12.8 oz (66.6 kg)    GENERAL: well appearing middle aged [place] American female. alert, no distress and comfortable SKIN: skin color, texture, turgor are normal, no rashes or significant lesions EYES: conjunctiva are pink and non-injected, sclera clear LUNGS: clear to auscultation and percussion with normal breathing effort HEART: regular rate & rhythm and no murmurs and no lower extremity edema Musculoskeletal: no cyanosis of digits and no clubbing.  PSYCH: alert & oriented x 3, fluent speech NEURO: no focal motor/sensory deficits   LABORATORY DATA:  I have reviewed the data as listed CBC Latest Ref Rng & Units 02/07/2021 01/17/2021 12/27/2020  WBC 4.0 - 10.5 K/uL 4.5 4.4 4.0  Hemoglobin 12.0 - 15.0 g/dL 9.5(L) 10.0(L) 10.5(L)  Hematocrit 36.0 - 46.0 % 29.9(L) 30.6(L) 31.8(L)  Platelets 150 - 400 K/uL 182 164 156    CMP Latest Ref Rng & Units 02/07/2021 01/17/2021 12/27/2020  Glucose 70 - 99 mg/dL 80  92 97  BUN 8 - 23 mg/dL 15 15 11   Creatinine 0.44 - 1.00 mg/dL 0.87 1.10(H) 1.07(H)  Sodium 135 - 145 mmol/L 143 139 144  Potassium 3.5 - 5.1 mmol/L 3.8 3.8 4.0  Chloride 98 - 111 mmol/L 108 104 106  CO2 22 - 32 mmol/L 27 28 28   Calcium 8.9 - 10.3 mg/dL 8.7(L) 8.9 9.4  Total Protein 6.5 - 8.1 g/dL 6.4(L) 6.9 7.1  Total Bilirubin 0.3 - 1.2 mg/dL <0.2(L) 0.3 0.3  Alkaline Phos 38 - 126 U/L 47 44 51  AST 15 - 41 U/L 20 25 29   ALT 0 - 44 U/L 12 13 14     RADIOGRAPHIC STUDIES: MR BRAIN W WO CONTRAST  Result Date: 01/30/2021 CLINICAL DATA:  Brain metastases. Brain/CNS neoplasm, assess treatment response. Metastatic lung cancer status post SRS. EXAM: MRI HEAD WITHOUT AND WITH CONTRAST TECHNIQUE: Multiplanar, multiecho pulse sequences of the brain and surrounding structures were obtained without and with intravenous contrast. CONTRAST:  25m GADAVIST GADOBUTROL 1 MMOL/ML IV SOLN COMPARISON:  Prior brain MRI examinations 10/10/2020 and earlier. FINDINGS: Brain: Mild generalized cerebral atrophy. Again demonstrated are multiple enhancing intracranial metastases, as follows. Stable 10 mm ring-enhancing lesion within the high medial left frontal lobe (series 1200, image 235). Stable mild surrounding T2 FLAIR hyperintensity. Stable adjacent punctate enhancing lesion within the high medial left frontal lobe (series 1200, image  236). Stable punctate enhancing lesion within the high anterior left frontal lobe (series 1200, image 214). Stable 8 mm ring-enhancing lesion within the medial right frontal lobe precentral gyrus (series 1200, image 232). Stable mild surrounding T2 FLAIR hyperintensity. Stable 3 mm enhancing lesion more laterally within the right frontal lobe precentral gyrus (series 1200, image 224). Stable 4 mm nodular enhancing cortical lesion within the right parietal lobe (series 1200, image 203). Stable 6 mm ring-enhancing lesion within the anterior right temporal lobe (series 1200, image 158). There  is recurrent enhancement at site of a treated lesion within the anterior left frontal lobe, with enhancement measuring 5 mm on the current exam (series 1200, image 199). There is no appreciable surrounding edema. As before, there are chronic blood products associated with many of the above lesions. Background mild multifocal T2 FLAIR hyperintense signal abnormality, likely reflecting a combination of chronic small vessel ischemic disease and post-treatment changes. There is no acute infarct. No extra-axial fluid collection. No midline shift. Vascular: Maintained flow voids within the proximal large arterial vessels. Skull and upper cervical spine: No focal suspicious marrow lesion. C5-C6 vertebral body ankylosis. Sinuses/Orbits: Visualized orbits show no acute finding. Trace scattered paranasal sinus mucosal thickening. Other: Small left mastoid effusion. IMPRESSION: Recurrent enhancement at site of a treated lesion within the anterior left frontal lobe, measuring 5 mm. Enhancement has not been present at this site since the prior brain MRI of 02/20/2020. No appreciable surrounding edema. The remaining intracranial metastases have remained stable since the prior MRI of 10/10/2020, as detailed. Electronically Signed   By: Kellie Simmering D.O.   On: 01/30/2021 12:10     ASSESSMENT & PLAN Elizabeth Mayer 60 y.o. female with medical history significant for metastatic adenocarcinoma of the lung who presents for follow up.    After review the labs, the records, discussion with the patient the findings are most consistent with metastatic adenocarcinoma the lung without any targetable mutations.  The patient has been started on carboplatin pemetrexed pembrolizumab every 3 weeks with Dr. Julien Nordmann, however the patient has expressed a desire to transition to another provider.  As such we were happy to take up her care here.  She already discontinued the carboplatin and is on maintenance pemetrexed/pembrolizumab therapy. We  assumed her care with Cycle 6 on 04/04/2020.  Patient returns today for a follow up due to progressive fatigue, mid back pain and episodes of dizziness/presyncope. Labs from today were reviewed without any intervention needed. UA was unremarkable for UTI. Repeat CT imaging did not show progressive disease at last check.  # Metastatic Adenocarcinoma of the Lung #Brain Metastasis --Most recent scan from 09/12/2020 showed the patient is having an excellent response to treatment with Carbo/Pem/Pem chemotherapy.   --no targetable mutations noted on prior workup. --continue to follow with Dr. Laqueta Due Onc for metastatic spread to the brain.  --can consider docetaxel/ramucirumab at time of progression.  Plan: --CT scan shows stable disease in Sept 2022. Repeat due Dec 2022 --today is Cycle 17 Day 1 of maintenance Pem/Pem --monitor CMP, CBC, and TSH with each treatment --RTC in 3 weeks for Cycle 18 of chemotherapy  #Chemotherapy Induced Anemia --Hgb 9.5 today, stable from prior --continue to monitor   # Fatigue: --Uncertain etiology but likely chemotherapy/immunotherapy side effects  --Monitor closely  # Mid back pain: --no findings of metastasis to spine or structural problems with back on last imaging.    No orders of the defined types were placed in this encounter.  All questions  were answered. The patient knows to call the clinic with any problems, questions or concerns.  I have spent a total of 30 minutes minutes of face-to-face and non-face-to-face time, preparing to see the patient, obtaining and/or reviewing separately obtained history, performing a medically appropriate examination, counseling and educating the patient, ordering tests, documenting clinical information in the electronic health record and care coordination.   Ledell Peoples, MD Department of Hematology/Oncology [place] at [facility] Phone: [phone] Pager: [phone] Email:  [email or web address]   02/07/2021 4:00 PM

## 2021-02-10 ENCOUNTER — Other Ambulatory Visit: Payer: Self-pay | Admitting: *Deleted

## 2021-02-10 MED ORDER — ONDANSETRON HCL 8 MG PO TABS: 8.0000 mg | ORAL_TABLET | Freq: Three times a day (TID) | ORAL | 0 refills | Status: DC | PRN

## 2021-02-11 ENCOUNTER — Other Ambulatory Visit: Payer: Self-pay

## 2021-02-11 ENCOUNTER — Ambulatory Visit
Admission: RE | Admit: 2021-02-11 | Discharge: 2021-02-11 | Disposition: A | Discharge status: Home / Self Care | Payer: Medicaid Other | Source: Ambulatory Visit | Attending: Radiation Oncology | Admitting: Radiation Oncology

## 2021-02-11 ENCOUNTER — Encounter: Payer: Self-pay | Admitting: Radiation Oncology

## 2021-02-11 DIAGNOSIS — Z510 Encounter for antineoplastic radiation therapy: Secondary | ICD-10-CM | POA: Diagnosis not present

## 2021-02-11 DIAGNOSIS — C7931 Secondary malignant neoplasm of brain: Secondary | ICD-10-CM

## 2021-02-11 DIAGNOSIS — C7949 Secondary malignant neoplasm of other parts of nervous system: Secondary | ICD-10-CM

## 2021-02-11 MED ORDER — LORAZEPAM 2 MG/ML IJ SOLN
2.0000 mg | Freq: Once | INTRAMUSCULAR | Status: AC
  Administered 2021-02-11: 2 mg via INTRAVENOUS
  Filled 2021-02-11: qty 1

## 2021-02-11 NOTE — Progress Notes (Signed)
Nurse monitoring complete status post 1 of 1 SRS treatments. Patient without complaints. Patient denies new or worsening neurologic symptoms. Vitals stable. Instructed patient to avoid strenuous activity for the next 24 hours. Instructed patient to call [phone] with needs related to treatment after hours or over the weekend. Patient verbalized understanding and agreement. Patient aware of 1 month F/U with Dr. Isidore Moos in December. Patient assisted out of clinic via wheelchair (due to being more relaxed from IM Ativan) to friend's vehicle without incident  Vitals:   02/11/21 1210 02/11/21 1310  BP: (!) 142/71 140/68  Pulse: 84 81  Resp:  18  Temp:  (!) 97.4 F (36.3 C)  SpO2:  100%

## 2021-02-11 NOTE — Progress Notes (Signed)
  Radiation Oncology         [phone] ________________________________  Name: KIANDRA SANGUINETTI MRN: [id number]  Date: 02/11/2021  DOB: October 31, 1960  Stereotactic Treatment Procedure Note  SPECIAL TREATMENT PROCEDURE  Outpatient    ICD-10-CM   1. Secondary malignant neoplasm of brain and spinal cord (HCC)  C79.31 LORazepam (ATIVAN) injection 2 mg   C79.49       3D TREATMENT PLANNING AND DOSIMETRY:  The patient's radiation plan was reviewed and approved by neurosurgery and radiation oncology prior to treatment.  It showed 3-dimensional radiation distributions overlaid onto the planning CT/MRI image set.  The [facility] for the target structures as well as the organs at risk were reviewed. The documentation of the 3D plan and dosimetry are filed in the radiation oncology EMR.  NARRATIVE:  Tykesha A Metheny was brought to the TrueBeam stereotactic radiation treatment machine and placed supine on the CT couch. The head frame was applied, and the patient was set up for stereotactic radiosurgery.  Neurosurgery was present for the set-up and delivery  SIMULATION VERIFICATION:  In the couch zero-angle position, the patient underwent Exactrac imaging using the Brainlab system with orthogonal KV images.  These were carefully aligned and repeated to confirm treatment position for each of the isocenters.  The Exactrac snap film verification was repeated at each couch angle.  SPECIAL TREATMENT PROCEDURE: Trysta A Hounshell received stereotactic radiosurgery to the following targets:   5 mm left frontal metastasis was treated to 20 Gray in 1 fraction using 3D SRS technique.  6 MV flattening filter free photons were used.  ExacTrac Snap verification was performed for each couch angle.  This constitutes a special treatment procedure due to the ablative dose delivered and the technical nature of treatment.  This highly technical modality of treatment ensures that the ablative dose is centered on the patient's tumor  while sparing normal tissues from excessive dose and risk of detrimental effects.  STEREOTACTIC TREATMENT MANAGEMENT:  Following delivery, the patient was transported to nursing in stable condition and monitored for possible acute effects.  Vital signs were recorded BP 140/68 (BP Location: Left Arm, Patient Position: Sitting)   Pulse 81   Temp (!) 97.4 F (36.3 C) (Temporal)   Resp 18   LMP  (LMP Unknown)   SpO2 100% . The patient tolerated treatment without significant acute effects, and was discharged to home in stable condition.    PLAN: Follow-up in one month.  ________________________________   Eppie Gibson, MD

## 2021-02-11 NOTE — Op Note (Signed)
  Name: Elizabeth Mayer  MRN: [id number]  Date: 02/11/2021   DOB: Sep 18, 1960  Stereotactic Radiosurgery Operative Note  PRE-OPERATIVE DIAGNOSIS:  Solitary Brain Metastasis  POST-OPERATIVE DIAGNOSIS:  Solitary Brain Metastasis  PROCEDURE:  Stereotactic Radiosurgery  SURGEON:  Charlie Pitter, MD  NARRATIVE: The patient underwent a radiation treatment planning session in the radiation oncology simulation suite under the care of the radiation oncology physician and physicist.  I participated closely in the radiation treatment planning afterwards. The patient underwent planning CT which was fused to 3T high resolution MRI with 1 mm axial slices.  These images were fused on the planning system.  We contoured the gross target volumes and subsequently expanded this to yield the Planning Target Volume. I actively participated in the planning process.  I helped to define and review the target contours and also the contours of the optic pathway, eyes, brainstem and selected nearby organs at risk.  All the dose constraints for critical structures were reviewed and compared to AAPM Task Group 101.  The prescription dose conformity was reviewed.  I approved the plan electronically.    Accordingly, Elizabeth Mayer was brought to the TrueBeam stereotactic radiation treatment linac and placed in the custom immobilization mask.  The patient was aligned according to the IR fiducial markers with BrainLab Exactrac, then orthogonal x-rays were used in ExacTrac with the 6DOF robotic table and the shifts were made to align the patient  Elizabeth Mayer received stereotactic radiosurgery uneventfully.    The detailed description of the procedure is recorded in the radiation oncology procedure note.  I was present for the duration of the procedure.  DISPOSITION:  Following delivery, the patient was transported to nursing in stable condition and monitored for possible acute effects to be discharged to home in stable condition  with follow-up in one month.  Charlie Pitter, MD 02/11/2021 1:02 PM

## 2021-02-13 ENCOUNTER — Telehealth: Payer: Self-pay | Admitting: *Deleted

## 2021-02-13 NOTE — Telephone Encounter (Signed)
TCT patient to check on her status.  Spoke with her. She said between the steroids she had taken for nausea and the ativan she had for her SRS, she felt a bit out of it yesterday. She is resting today. She has completed the steroids.  She is just tired.  No c/o nausea/vomiting. Advised to call back with any questions or concerns.

## 2021-02-24 ENCOUNTER — Other Ambulatory Visit: Payer: Self-pay | Admitting: Emergency Medicine

## 2021-02-28 ENCOUNTER — Other Ambulatory Visit: Payer: Self-pay

## 2021-02-28 ENCOUNTER — Ambulatory Visit: Payer: Medicaid Other | Admitting: Physician Assistant

## 2021-02-28 ENCOUNTER — Inpatient Hospital Stay: Payer: Medicaid Other

## 2021-02-28 ENCOUNTER — Inpatient Hospital Stay (HOSPITAL_BASED_OUTPATIENT_CLINIC_OR_DEPARTMENT_OTHER): Payer: Medicaid Other | Admitting: Hematology and Oncology

## 2021-02-28 ENCOUNTER — Other Ambulatory Visit: Payer: Self-pay | Admitting: Physician Assistant

## 2021-02-28 VITALS — BP 170/79 | HR 79 | Temp 98.1°F | Wt 152.5 lb

## 2021-02-28 VITALS — BP 165/70 | HR 82 | Temp 96.8°F | Resp 18 | Wt 151.1 lb

## 2021-02-28 DIAGNOSIS — Z95828 Presence of other vascular implants and grafts: Secondary | ICD-10-CM

## 2021-02-28 DIAGNOSIS — C7931 Secondary malignant neoplasm of brain: Secondary | ICD-10-CM

## 2021-02-28 DIAGNOSIS — Z5112 Encounter for antineoplastic immunotherapy: Secondary | ICD-10-CM

## 2021-02-28 DIAGNOSIS — C3491 Malignant neoplasm of unspecified part of right bronchus or lung: Secondary | ICD-10-CM

## 2021-02-28 DIAGNOSIS — Z5111 Encounter for antineoplastic chemotherapy: Secondary | ICD-10-CM | POA: Diagnosis not present

## 2021-02-28 DIAGNOSIS — M549 Dorsalgia, unspecified: Secondary | ICD-10-CM

## 2021-02-28 LAB — CBC WITH DIFFERENTIAL (CANCER CENTER ONLY)
Abs Immature Granulocytes: 0.01 10*3/uL (ref 0.00–0.07)
Basophils Absolute: 0.1 10*3/uL (ref 0.0–0.1)
Basophils Relative: 2 %
Eosinophils Absolute: 0.1 10*3/uL (ref 0.0–0.5)
Eosinophils Relative: 2 %
HCT: 30.3 % — ABNORMAL LOW (ref 36.0–46.0)
Hemoglobin: 9.7 g/dL — ABNORMAL LOW (ref 12.0–15.0)
Immature Granulocytes: 0 %
Lymphocytes Relative: 21 %
Lymphs Abs: 0.8 10*3/uL (ref 0.7–4.0)
MCH: 30.8 pg (ref 26.0–34.0)
MCHC: 32 g/dL (ref 30.0–36.0)
MCV: 96.2 fL (ref 80.0–100.0)
Monocytes Absolute: 0.4 10*3/uL (ref 0.1–1.0)
Monocytes Relative: 11 %
Neutro Abs: 2.6 10*3/uL (ref 1.7–7.7)
Neutrophils Relative %: 64 %
Platelet Count: 180 10*3/uL (ref 150–400)
RBC: 3.15 MIL/uL — ABNORMAL LOW (ref 3.87–5.11)
RDW: 15.6 % — ABNORMAL HIGH (ref 11.5–15.5)
WBC Count: 4 10*3/uL (ref 4.0–10.5)
nRBC: 0 % (ref 0.0–0.2)

## 2021-02-28 LAB — CMP (CANCER CENTER ONLY)
ALT: 23 U/L (ref 0–44)
AST: 31 U/L (ref 15–41)
Albumin: 3.4 g/dL — ABNORMAL LOW (ref 3.5–5.0)
Alkaline Phosphatase: 50 U/L (ref 38–126)
Anion gap: 10 (ref 5–15)
BUN: 9 mg/dL (ref 8–23)
CO2: 27 mmol/L (ref 22–32)
Calcium: 9 mg/dL (ref 8.9–10.3)
Chloride: 108 mmol/L (ref 98–111)
Creatinine: 1.15 mg/dL — ABNORMAL HIGH (ref 0.44–1.00)
GFR, Estimated: 54 mL/min — ABNORMAL LOW (ref >60)
Glucose, Bld: 90 mg/dL (ref 70–99)
Potassium: 3.7 mmol/L (ref 3.5–5.1)
Sodium: 145 mmol/L (ref 135–145)
Total Bilirubin: 0.3 mg/dL (ref 0.3–1.2)
Total Protein: 6.7 g/dL (ref 6.5–8.1)

## 2021-02-28 LAB — TSH: TSH: 1.455 u[IU]/mL (ref 0.308–3.960)

## 2021-02-28 LAB — MAGNESIUM: Magnesium: 1.6 mg/dL — ABNORMAL LOW (ref 1.7–2.4)

## 2021-02-28 MED ORDER — SODIUM CHLORIDE 0.9 % IV SOLN
500.0000 mg/m2 | Freq: Once | INTRAVENOUS | Status: AC
  Administered 2021-02-28: 900 mg via INTRAVENOUS
  Filled 2021-02-28: qty 20

## 2021-02-28 MED ORDER — PROCHLORPERAZINE MALEATE 10 MG PO TABS
10.0000 mg | ORAL_TABLET | Freq: Once | ORAL | Status: AC
  Administered 2021-02-28: 10 mg via ORAL
  Filled 2021-02-28: qty 1

## 2021-02-28 MED ORDER — HEPARIN SOD (PORK) LOCK FLUSH 100 UNIT/ML IV SOLN
500.0000 [IU] | Freq: Once | INTRAVENOUS | Status: AC | PRN
  Administered 2021-02-28: 500 [IU]

## 2021-02-28 MED ORDER — ACETAMINOPHEN 325 MG PO TABS: 650.0000 mg | ORAL_TABLET | Freq: Four times a day (QID) | ORAL | Status: DC | PRN

## 2021-02-28 MED ORDER — SODIUM CHLORIDE 0.9 % IV SOLN: Freq: Once | INTRAVENOUS | Status: AC

## 2021-02-28 MED ORDER — SODIUM CHLORIDE 0.9% FLUSH: 10.0000 mL | INTRAVENOUS | Status: DC | PRN

## 2021-02-28 MED ORDER — SODIUM CHLORIDE 0.9 % IV SOLN
200.0000 mg | Freq: Once | INTRAVENOUS | Status: AC
  Administered 2021-02-28: 200 mg via INTRAVENOUS
  Filled 2021-02-28: qty 8

## 2021-02-28 MED ORDER — SODIUM CHLORIDE 0.9% FLUSH
10.0000 mL | Freq: Once | INTRAVENOUS | Status: AC
  Administered 2021-02-28: 10 mL

## 2021-02-28 MED ORDER — ACETAMINOPHEN 325 MG PO TABS
650.0000 mg | ORAL_TABLET | Freq: Once | ORAL | Status: AC
  Administered 2021-02-28: 650 mg via ORAL

## 2021-02-28 NOTE — Progress Notes (Signed)
[facility] Telephone:([phone]) [phone]   [phone]  PROGRESS NOTE  Patient Care Team: Minette Brine, FNP as PCP - General (General Practice)  Hematological/Oncological History # Metastatic Adenocarcinoma of the Lung #Brain Metastasis 1) 11/08/2019: establish care with Dr. Julien Nordmann and his PA Cassie Heilingoetter. Noted to have widely metastatic lung cancer with involvement of the brain, lymph nodes, and right lung.  2) 12/04/2019: palliative radiation to the right lung mass/cervical adenopathy 3) 9/7/02021: started therapy with Carbo/Pem/Pem 4) 9/29-10/09/2019: patient underwent SRS to the brain mets 5) 03/22/2020: repeat CT C/A/P and neck scheduled. Transfer care to Dr. Lorenso Courier.   6) 03/22/2020: CT C/A/P showed interval significant improvement in previously demonstrated extensive confluent lymphadenopathy in the superior mediastinum and right hilar regions. 7) 08/15/2020: Holding chemotherapy due to poor Hgb and fatigue. Allowing patient a brief 'chemo holiday' to rebound appropriately from last cycle.  8) 08/29/2020: restarted chemotherapy, Cycle 12 Day 1 9) 09/19/2020: patient requested to HOLD Cycle 13 in setting of fatigue/weakness.  10) 11/29/2020: Cycle 14 Day 1 of chemotherapy, delayed start due to insurance issues.   Interval History:  Elizabeth Mayer 60 y.o. female with medical history significant for metastatic adenocarcinoma of the lung who presents for follow up. The patient's last visit was on 02/07/2021 when she received Cycle 17, Day 1 of pem/pem maintenance.  In the interim since her last visit she has had no other changed in her health.   On exam today Elizabeth Mayer reports she has been feeling cold for the last few weeks.  She reports that her breathing is at baseline and she is currently using 3 L of oxygen still.  She reports her appetite is good and her weight is increased from 146 pounds up to 151 pounds.  She notes that she does occasionally have some  episodes of dizzy spells that come and go and last under a minute.  She notes that she does feel some puffiness in her legs and has had some slight nausea for which she is taking her as needed nausea medications.  Otherwise she has been quite well and had no other major side effects as result of her treatment.  She denies any fevers, chills, night sweats, chest pain or cough. She has no other complaints.  A full 10 point ROS is listed below.  MEDICAL HISTORY:  Past Medical History:  Diagnosis Date   Anemia    Angina 1982   related to stress   Asthma    in the past,  no current problems   Complication of anesthesia    states anesthesia made her hair fall out   Constipation    Dyspnea    on oxygen at home - 2L via [place]   Fatigue    GERD (gastroesophageal reflux disease)    patient denies this dx   Headache    Heart murmur 1970s   no problems currently   Ingrown toenail    Lung cancer ([place]) 10/2019   metastatic disease to the brain   On home oxygen therapy    2.2 lpm, 24 hours a day   Past heart attack 1980-1981   pt states she passed out and woke up in hospital- told she had heart attack, but then dr said he couldn't find anything wrong.   Pneumonia    x 1    SURGICAL HISTORY: Past Surgical History:  Procedure Laterality Date   CERVICAL DISC SURGERY  2000   Disc removed from neck  ingrown toe nail surgery Bilateral    IR IMAGING GUIDED PORT INSERTION  02/16/2020   MULTIPLE TOOTH EXTRACTIONS     for braces   RADIOLOGY WITH ANESTHESIA N/A 12/05/2019   Procedure: MRI BRAIN WITH AND WITHOUT CONTRAST;  Surgeon: Radiologist, Medication, MD;  Location: [facility];  Service: Radiology;  Laterality: N/A;   RADIOLOGY WITH ANESTHESIA N/A 01/02/2020   Procedure: MRI BRAIN WITH AND WITHOUT CONTRAST;  Surgeon: Radiologist, Medication, MD;  Location: [facility];  Service: Radiology;  Laterality: N/A;   RADIOLOGY WITH ANESTHESIA N/A 02/20/2020   Procedure: MRI WITH ANESTHESIA OF BRAIN WITH AND  WITHOUT CONTRAST;  Surgeon: Radiologist, Medication, MD;  Location: [facility];  Service: Radiology;  Laterality: N/A;   RADIOLOGY WITH ANESTHESIA N/A 06/11/2020   Procedure: MRI WITH ANESTHESIA OF BRAIN WITH AND WITHOUT CONTRAST;  Surgeon: Radiologist, Medication, MD;  Location: [place];  Service: Radiology;  Laterality: N/A;   RADIOLOGY WITH ANESTHESIA N/A 10/10/2020   Procedure: MRI WITH ANESTHESIA BRAIN WITH AND WITHOUT CONTRAST;  Surgeon: Radiologist, Medication, MD;  Location: [facility];  Service: Radiology;  Laterality: N/A;   RADIOLOGY WITH ANESTHESIA N/A 01/30/2021   Procedure: MRI BRAIN WITH AND WITHOUT CONTRASTWITH ANESTHESIA;  Surgeon: Radiologist, Medication, MD;  Location: [facility];  Service: Radiology;  Laterality: N/A;   TONSILLECTOMY      SOCIAL HISTORY: Social History   Socioeconomic History   Marital status: Legally Separated    Spouse name: Not on file   Number of children: 0   Years of education: Not on file   Highest education level: Not on file  Occupational History   Occupation: Secondary school teacher  Tobacco Use   Smoking status: Former    Packs/day: 1.00    Years: 20.00    Pack years: 20.00    Types: Cigarettes    Start date: 17    Quit date: 2001    Years since quitting: 21.9   Smokeless tobacco: Never  Vaping Use   Vaping Use: Never used  Substance and Sexual Activity   Alcohol use: No   Drug use: No   Sexual activity: Yes    Birth control/protection: None, Post-menopausal  Other Topics Concern   Not on file  Social History Narrative   Not on file   Social Determinants of Health   Financial Resource Strain: Not on file  Food Insecurity: Not on file  Transportation Needs: Not on file  Physical Activity: Not on file  Stress: Not on file  Social Connections: Not on file  Intimate Partner Violence: Not on file    FAMILY HISTORY: Family History  Problem Relation Age of Onset   Arthritis Mother    Heart failure Father     ALLERGIES:  is allergic to  pseudoephedrine, contrast media [iodinated diagnostic agents], gabapentin, mobic [meloxicam], and penicillins.  MEDICATIONS:  Current Outpatient Medications  Medication Sig Dispense Refill   acetaminophen (TYLENOL) 500 MG tablet Take 1,000 mg by mouth every 6 (six) hours as needed for moderate pain.      benzoyl peroxide (CERAVE ACNE FOAMING CREAM) 4 % external liquid Apply 1 application topically 2 (two) times daily. Face     camphor-menthol (SARNA) lotion Apply 1 application topically daily as needed for itching.     Carboxymethylcellulose Sodium (REFRESH CELLUVISC OP) Place 2 drops into both eyes See admin instructions. Instill 2  drop into both eyes twice daily (scheduled) & then use as needed throughout the day for dry/irritated eyes     clindamycin (CLEOCIN  T) 1 % lotion Apply 1 application topically 2 (two) times daily.     Cyanocobalamin (B-12 COMPLIANCE INJECTION) 1000 MCG/ML KIT Inject 1,000 mcg as directed See admin instructions. As needed give at Chemo     cyclobenzaprine (FLEXERIL) 10 MG tablet Take 1 tablet (10 mg total) by mouth 3 (three) times daily as needed for muscle spasms. 30 tablet 0   fexofenadine (ALLEGRA) 180 MG tablet Take 180 mg by mouth in the morning.     fluticasone (FLONASE) 50 MCG/ACT nasal spray Place 1 spray into both nostrils daily.     folic acid (FOLVITE) 1 MG tablet Take 1 tablet (1 mg total) by mouth daily. (Patient taking differently: Take 1 mg by mouth in the morning.) 90 tablet 2   guaiFENesin (MUCINEX) 600 MG 12 hr tablet Take 600 mg by mouth 2 (two) times daily.      HYDROcodone-acetaminophen (NORCO/VICODIN) 5-325 MG tablet Take 1 tablet by mouth every 6 (six) hours as needed for moderate pain. 30 tablet 0   lidocaine (XYLOCAINE) 2 % solution Use as directed 15 mLs in the mouth or throat as needed for mouth pain. Swallow 30 min prior to meals and at bedtime for throat/esophagus pain 250 mL 1   lidocaine-prilocaine (EMLA) cream Apply 1 application  topically as needed. (Patient taking differently: Apply 1 application topically as needed (port access). Every three weeks) 30 g 2   Multiple Vitamin (MULTIVITAMIN WITH MINERALS) TABS tablet Take 2 tablets by mouth daily. Alive/ gummies     ondansetron (ZOFRAN) 8 MG tablet Take 1 tablet (8 mg total) by mouth every 8 (eight) hours as needed for nausea or vomiting. 20 tablet 0   OXYGEN Inhale 3 L/min into the lungs continuous.     potassium chloride 20 MEQ/15ML (10%) SOLN Take 15 mLs (20 mEq total) by mouth 2 (two) times daily. (Patient taking differently: Take 20 mEq by mouth daily as needed (Low patassium).) 210 mL 0   PROAIR HFA 108 (90 Base) MCG/ACT inhaler Inhale 1-2 puffs into the lungs every 6 (six) hours as needed for wheezing or shortness of breath. 18 g 3   prochlorperazine (COMPAZINE) 10 MG tablet Take 1 tablet (10 mg total) by mouth every 6 (six) hours as needed. (Patient taking differently: Take 10 mg by mouth every 6 (six) hours as needed (nausea with chemo).) 30 tablet 2   Skin Protectants, Misc. (EUCERIN) cream Apply 1 application topically as needed (Ezcema).     Soap & Cleansers (MEDERMA AG FACIAL TONER) LIQD Apply 1 application topically 2 (two) times daily. Advance dry skin     SYMBICORT 80-4.5 MCG/ACT inhaler INHALE 2 PUFFS BY MOUTH IN THE MORNING AND AT BEDTIME 11 g 0   triamcinolone ointment (KENALOG) 0.1 % Apply 1 application topically in the morning and at bedtime.     No current facility-administered medications for this visit.    REVIEW OF SYSTEMS:   Constitutional: ( - ) fevers, ( - )  chills , ( - ) night sweats Eyes: ( - ) blurriness of vision, ( - ) double vision, ( - ) watery eyes Ears, nose, mouth, throat, and face: ( - ) mucositis, ( - ) sore throat Respiratory: ( - ) cough, ( - ) dyspnea, ( - ) wheezes Cardiovascular: ( - ) palpitation, ( - ) chest discomfort, ( - ) lower extremity swelling Gastrointestinal:  ( - ) nausea, ( - ) heartburn, ( - ) change in bowel  habits Skin: ( - )  abnormal skin rashes Lymphatics: ( - ) new lymphadenopathy, ( - ) easy bruising Neurological: ( - ) numbness, ( - ) tingling, ( - ) new weaknesses Behavioral/Psych: ( - ) mood change, ( - ) new changes  All other systems were reviewed with the patient and are negative.  PHYSICAL EXAMINATION: ECOG PERFORMANCE STATUS: 3 - Symptomatic, >50% confined to bed  Vitals:   02/28/21 0958  BP: (!) 165/70  Pulse: 82  Resp: 18  Temp: (!) 96.8 F (36 C)  SpO2: 100%    Filed Weights   02/28/21 0958  Weight: 151 lb 1.6 oz (68.5 kg)    GENERAL: well appearing middle aged [place] American female. alert, no distress and comfortable SKIN: skin color, texture, turgor are normal, no rashes or significant lesions EYES: conjunctiva are pink and non-injected, sclera clear LUNGS: clear to auscultation and percussion with normal breathing effort HEART: regular rate & rhythm and no murmurs and no lower extremity edema Musculoskeletal: no cyanosis of digits and no clubbing.  PSYCH: alert & oriented x 3, fluent speech NEURO: no focal motor/sensory deficits   LABORATORY DATA:  I have reviewed the data as listed CBC Latest Ref Rng & Units 02/28/2021 02/07/2021 01/17/2021  WBC 4.0 - 10.5 K/uL 4.0 4.5 4.4  Hemoglobin 12.0 - 15.0 g/dL 9.7(L) 9.5(L) 10.0(L)  Hematocrit 36.0 - 46.0 % 30.3(L) 29.9(L) 30.6(L)  Platelets 150 - 400 K/uL 180 182 164    CMP Latest Ref Rng & Units 02/28/2021 02/07/2021 01/17/2021  Glucose 70 - 99 mg/dL 90 80 92  BUN 8 - 23 mg/dL 9 15 15   Creatinine 0.44 - 1.00 mg/dL 1.15(H) 0.87 1.10(H)  Sodium 135 - 145 mmol/L 145 143 139  Potassium 3.5 - 5.1 mmol/L 3.7 3.8 3.8  Chloride 98 - 111 mmol/L 108 108 104  CO2 22 - 32 mmol/L 27 27 28   Calcium 8.9 - 10.3 mg/dL 9.0 8.7(L) 8.9  Total Protein 6.5 - 8.1 g/dL 6.7 6.4(L) 6.9  Total Bilirubin 0.3 - 1.2 mg/dL 0.3 <0.2(L) 0.3  Alkaline Phos 38 - 126 U/L 50 47 44  AST 15 - 41 U/L 31 20 25   ALT 0 - 44 U/L 23 12 13      RADIOGRAPHIC STUDIES: No results found.   ASSESSMENT & PLAN Elizabeth Mayer 60 y.o. female with medical history significant for metastatic adenocarcinoma of the lung who presents for follow up.    After review the labs, the records, discussion with the patient the findings are most consistent with metastatic adenocarcinoma the lung without any targetable mutations.  The patient has been started on carboplatin pemetrexed pembrolizumab every 3 weeks with Dr. Julien Nordmann, however the patient has expressed a desire to transition to another provider.  As such we were happy to take up her care here.  She already discontinued the carboplatin and is on maintenance pemetrexed/pembrolizumab therapy. We assumed her care with Cycle 6 on 04/04/2020.  # Metastatic Adenocarcinoma of the Lung #Brain Metastasis --Most recent scan from 09/12/2020 showed the patient is having an excellent response to treatment with Carbo/Pem/Pem chemotherapy.   --no targetable mutations noted on prior workup. --continue to follow with Dr. Laqueta Due Onc for metastatic spread to the brain.  --can consider docetaxel/ramucirumab at time of progression.  Plan: --CT scan shows stable disease in Sept 2022. Repeat due Dec 2022 --today is Cycle 18 Day 1 of maintenance Pem/Pem --monitor CMP, CBC, and TSH with each treatment --RTC in 3 weeks for Cycle 19 of chemotherapy  #Chemotherapy Induced  Anemia --Hgb 9.7 today, stable from prior --continue to monitor   # Fatigue: --Uncertain etiology but likely chemotherapy/immunotherapy side effects  --Monitor closely  # Mid back pain: --no findings of metastasis to spine or structural problems with back on last imaging.    Orders Placed This Encounter  Procedures   CT CHEST ABDOMEN PELVIS W CONTRAST    Standing Status:   Future    Standing Expiration Date:   02/28/2022    Order Specific Question:   Preferred imaging location?    Answer:   [facility]    Order Specific  Question:   Is Oral Contrast requested for this exam?    Answer:   Yes, Per Radiology protocol    Order Specific Question:   Reason for Exam (SYMPTOM  OR DIAGNOSIS REQUIRED)    Answer:   history of metastatic lung cancer, assess for response    All questions were answered. The patient knows to call the clinic with any problems, questions or concerns.  I have spent a total of 30 minutes minutes of face-to-face and non-face-to-face time, preparing to see the patient, obtaining and/or reviewing separately obtained history, performing a medically appropriate examination, counseling and educating the patient, ordering tests, documenting clinical information in the electronic health record and care coordination.   Ledell Peoples, MD Department of Hematology/Oncology [place] [facility] Phone: [phone] Pager: [phone] Email: [email or web address]   03/02/2021 2:21 PM

## 2021-02-28 NOTE — Progress Notes (Signed)
1305 Patient complained of back pain 7/10, requests tylenol. On call notified, ordered and given. Patient expresses relief. Tolerated PEMEtrexed (pembrolizumab infusion well, no concerns voiced. Patient discharged. Stable.

## 2021-02-28 NOTE — Patient Instructions (Signed)
Pembrolizumab injection What is this medication? PEMBROLIZUMAB (pem broe liz ue mab) is a monoclonal antibody. It is used to treat certain types of cancer. This medicine may be used for other purposes; ask your health care provider or pharmacist if you have questions. COMMON BRAND NAME(S): Keytruda What should I tell my care team before I take this medication? They need to know if you have any of these conditions: autoimmune diseases like Crohn's disease, ulcerative colitis, or lupus have had or planning to have an allogeneic stem cell transplant (uses someone else's stem cells) history of organ transplant history of chest radiation nervous system problems like myasthenia gravis or Guillain-Barre syndrome an unusual or allergic reaction to pembrolizumab, other medicines, foods, dyes, or preservatives pregnant or trying to get pregnant breast-feeding How should I use this medication? This medicine is for infusion into a vein. It is given by a health care professional in a hospital or clinic setting. A special MedGuide will be given to you before each treatment. Be sure to read this information carefully each time. Talk to your pediatrician regarding the use of this medicine in children. While this drug may be prescribed for children as young as 6 months for selected conditions, precautions do apply. Overdosage: If you think you have taken too much of this medicine contact a poison control center or emergency room at once. NOTE: This medicine is only for you. Do not share this medicine with others. What if I miss a dose? It is important not to miss your dose. Call your doctor or health care professional if you are unable to keep an appointment. What may interact with this medication? Interactions have not been studied. This list may not describe all possible interactions. Give your health care provider a list of all the medicines, herbs, non-prescription drugs, or dietary supplements you use.  Also tell them if you smoke, drink alcohol, or use illegal drugs. Some items may interact with your medicine. What should I watch for while using this medication? Your condition will be monitored carefully while you are receiving this medicine. You may need blood work done while you are taking this medicine. Do not become pregnant while taking this medicine or for 4 months after stopping it. Women should inform their doctor if they wish to become pregnant or think they might be pregnant. There is a potential for serious side effects to an unborn child. Talk to your health care professional or pharmacist for more information. Do not breast-feed an infant while taking this medicine or for 4 months after the last dose. What side effects may I notice from receiving this medication? Side effects that you should report to your doctor or health care professional as soon as possible: allergic reactions like skin rash, itching or hives, swelling of the face, lips, or tongue bloody or black, tarry breathing problems changes in vision chest pain chills confusion constipation cough diarrhea dizziness or feeling faint or lightheaded fast or irregular heartbeat fever flushing joint pain low blood counts - this medicine may decrease the number of white blood cells, red blood cells and platelets. You may be at increased risk for infections and bleeding. muscle pain muscle weakness pain, tingling, numbness in the hands or feet persistent headache redness, blistering, peeling or loosening of the skin, including inside the mouth signs and symptoms of high blood sugar such as dizziness; dry mouth; dry skin; fruity breath; nausea; stomach pain; increased hunger or thirst; increased urination signs and symptoms of kidney injury like trouble  passing urine or change in the amount of urine signs and symptoms of liver injury like dark urine, light-colored stools, loss of appetite, nausea, right upper belly pain,  yellowing of the eyes or skin sweating swollen lymph nodes weight loss Side effects that usually do not require medical attention (report to your doctor or health care professional if they continue or are bothersome): decreased appetite hair loss tiredness This list may not describe all possible side effects. Call your doctor for medical advice about side effects. You may report side effects to FDA at 1-800-FDA-1088. Where should I keep my medication? This drug is given in a hospital or clinic and will not be stored at home. NOTE: This sheet is a summary. It may not cover all possible information. If you have questions about this medicine, talk to your doctor, pharmacist, or health care provider.  2022 [employer]vier/Gold Standard (2020-12-10 00:00:00)

## 2021-03-02 ENCOUNTER — Encounter: Payer: Self-pay | Admitting: Internal Medicine

## 2021-03-02 ENCOUNTER — Encounter: Payer: Self-pay | Admitting: Physician Assistant

## 2021-03-04 ENCOUNTER — Telehealth: Payer: Self-pay | Admitting: Emergency Medicine

## 2021-03-04 NOTE — Telephone Encounter (Signed)
Called and spoke with patient. She stated that she switched insurance to Medicaid back in August 2022. Per Medicaid, she will need to have another walk test to prove she still needs O2. She confirmed that she was not walked at the last visit because she did not know at the time. Also confirmed her DME is [employer].   I was able to get her scheduled for a walk test on 03/07/21 at 11am. She verbalized understanding.   Nothing further needed at time of call.

## 2021-03-07 ENCOUNTER — Other Ambulatory Visit: Payer: Self-pay

## 2021-03-07 ENCOUNTER — Ambulatory Visit: Payer: Medicaid Other | Admitting: Emergency Medicine

## 2021-03-07 DIAGNOSIS — J9611 Chronic respiratory failure with hypoxia: Secondary | ICD-10-CM

## 2021-03-14 ENCOUNTER — Ambulatory Visit
Admission: RE | Admit: 2021-03-14 | Discharge: 2021-03-14 | Disposition: A | Discharge status: Home / Self Care | Payer: Medicaid Other | Source: Ambulatory Visit | Attending: Radiation Oncology | Admitting: Radiation Oncology

## 2021-03-14 ENCOUNTER — Other Ambulatory Visit: Payer: Self-pay | Admitting: *Deleted

## 2021-03-14 ENCOUNTER — Encounter: Payer: Self-pay | Admitting: Radiation Oncology

## 2021-03-14 ENCOUNTER — Other Ambulatory Visit: Payer: Self-pay

## 2021-03-14 VITALS — BP 124/62 | HR 87 | Resp 19 | Wt 148.2 lb

## 2021-03-14 DIAGNOSIS — R410 Disorientation, unspecified: Secondary | ICD-10-CM | POA: Diagnosis not present

## 2021-03-14 DIAGNOSIS — Z7951 Long term (current) use of inhaled steroids: Secondary | ICD-10-CM | POA: Insufficient documentation

## 2021-03-14 DIAGNOSIS — C3491 Malignant neoplasm of unspecified part of right bronchus or lung: Secondary | ICD-10-CM | POA: Diagnosis not present

## 2021-03-14 DIAGNOSIS — Z923 Personal history of irradiation: Secondary | ICD-10-CM | POA: Insufficient documentation

## 2021-03-14 DIAGNOSIS — R110 Nausea: Secondary | ICD-10-CM | POA: Insufficient documentation

## 2021-03-14 DIAGNOSIS — C7931 Secondary malignant neoplasm of brain: Secondary | ICD-10-CM | POA: Diagnosis present

## 2021-03-14 DIAGNOSIS — R270 Ataxia, unspecified: Secondary | ICD-10-CM | POA: Diagnosis not present

## 2021-03-14 DIAGNOSIS — Z79899 Other long term (current) drug therapy: Secondary | ICD-10-CM | POA: Diagnosis not present

## 2021-03-14 MED ORDER — PREDNISONE 50 MG PO TABS: ORAL_TABLET | ORAL | 0 refills | Status: DC

## 2021-03-14 NOTE — Progress Notes (Signed)
Ms. Reining presents today for follow-up after completing [place] treatment on 02/11/2021 and 01/12/2020   Dose of Decadron, if applicable: Not currently prescribed   Recent neurologic symptoms, if any:  Seizures: Patient denies Headaches: Reports occasional headaches, but states they are rare and tend to resolve on their own Nausea: Reports decrease in nausea, but continues to deal with decreased appetite and interest in eating Wt Readings from Last 3 Encounters:  03/14/21 148 lb 4 oz (67.2 kg)  02/28/21 152 lb 8 oz (69.2 kg)  02/28/21 151 lb 1.6 oz (68.5 kg)   Dizziness/ataxia: Continues to report dizziness and unsteadiness when she ambulates. Denies any falls or loss of balance Difficulty with hand coordination: Patient denies Focal numbness/weakness: Patient denies; does report occasional tingling ("pins and needles") sensation to her upper and lower extremities, as well as down her back Visual deficits/changes: Reports when she's looking at a page/screen with dark text, the text on the right half of the page will lighten/turn gray. She states it resolves quickly after blinking a couple times, and text returns to a uniform dark color Confusion/Memory deficits: Reports an increase in expressive aphasia or trouble finding her words  Additional Complaints / other details: Scheduled for CT C/A/P w/ contrast on 03/18/21 and then F/U with [facility] on 03/21/2021. Last pembrolizumab infusion was 02/28/2021

## 2021-03-14 NOTE — Progress Notes (Signed)
Radiation Oncology         [phone] ________________________________  Name: Elizabeth Mayer MRN: [id number]  Date: 03/14/2021  DOB: May 20, 1960  Follow-Up Visit Note  Outpatient  CC: Elizabeth Brine, FNP  Elizabeth Brine, FNP  Diagnosis and Prior Radiotherapy:    ICD-10-CM   1. Brain metastases ([place])  C79.31       CHIEF COMPLAINT: Here for follow-up and surveillance of brain cancer  Narrative:  The patient returns today for routine follow-up.  Elizabeth Mayer presents today for follow-up after completing [place] treatment on 02/11/2021 and 01/12/2020   Dose of Decadron, if applicable: Not currently prescribed   Recent neurologic symptoms, if any:  Seizures: Patient denies Headaches: Reports occasional headaches, but states they are rare and tend to resolve on their own Nausea: Reports decrease in nausea, but continues to deal with decreased appetite and interest in eating Wt Readings from Last 3 Encounters:  03/14/21 148 lb 4 oz (67.2 kg)  02/28/21 152 lb 8 oz (69.2 kg)  02/28/21 151 lb 1.6 oz (68.5 kg)   Dizziness/ataxia: Continues to report dizziness and unsteadiness when she ambulates. Denies any falls or loss of balance Difficulty with hand coordination: Patient denies Focal numbness/weakness: Patient denies; does report occasional tingling ("pins and needles") sensation to her upper and lower extremities, as well as down her back Visual deficits/changes: Reports when she's looking at a page/screen with dark text, the text on the right half of the page will lighten/turn gray. She states it resolves quickly after blinking a couple times, and text returns to a uniform dark color Confusion/Memory deficits: Reports an increase in expressive aphasia or trouble finding her words  Additional Complaints / other details: Scheduled for CT C/A/P w/ contrast on 03/18/21 and then F/U with [facility] on 03/21/2021. Last pembrolizumab infusion was 02/28/2021                               ALLERGIES:   is allergic to pseudoephedrine, contrast media [iodinated diagnostic agents], gabapentin, mobic [meloxicam], and penicillins.  Meds: Current Outpatient Medications  Medication Sig Dispense Refill   acetaminophen (TYLENOL) 500 MG tablet Take 1,000 mg by mouth every 6 (six) hours as needed for moderate pain.      benzoyl peroxide (CERAVE ACNE FOAMING CREAM) 4 % external liquid Apply 1 application topically 2 (two) times daily. Face     camphor-menthol (SARNA) lotion Apply 1 application topically daily as needed for itching.     Carboxymethylcellulose Sodium (REFRESH CELLUVISC OP) Place 2 drops into both eyes See admin instructions. Instill 2  drop into both eyes twice daily (scheduled) & then use as needed throughout the day for dry/irritated eyes     clindamycin (CLEOCIN T) 1 % lotion Apply 1 application topically 2 (two) times daily.     Cyanocobalamin (B-12 COMPLIANCE INJECTION) 1000 MCG/ML KIT Inject 1,000 mcg as directed See admin instructions. As needed give at Chemo     cyclobenzaprine (FLEXERIL) 10 MG tablet Take 1 tablet (10 mg total) by mouth 3 (three) times daily as needed for muscle spasms. 30 tablet 0   fexofenadine (ALLEGRA) 180 MG tablet Take 180 mg by mouth in the morning.     fluticasone (FLONASE) 50 MCG/ACT nasal spray Place 1 spray into both nostrils daily.     folic acid (FOLVITE) 1 MG tablet Take 1 tablet (1 mg total) by mouth daily. (Patient taking differently: Take 1 mg by mouth in  the morning.) 90 tablet 2   guaiFENesin (MUCINEX) 600 MG 12 hr tablet Take 600 mg by mouth 2 (two) times daily.      HYDROcodone-acetaminophen (NORCO/VICODIN) 5-325 MG tablet Take 1 tablet by mouth every 6 (six) hours as needed for moderate pain. 30 tablet 0   lidocaine (XYLOCAINE) 2 % solution Use as directed 15 mLs in the mouth or throat as needed for mouth pain. Swallow 30 min prior to meals and at bedtime for throat/esophagus pain 250 mL 1   lidocaine-prilocaine (EMLA) cream Apply 1 application  topically as needed. (Patient taking differently: Apply 1 application topically as needed (port access). Every three weeks) 30 g 2   Multiple Vitamin (MULTIVITAMIN WITH MINERALS) TABS tablet Take 2 tablets by mouth daily. Alive/ gummies     ondansetron (ZOFRAN) 8 MG tablet Take 1 tablet (8 mg total) by mouth every 8 (eight) hours as needed for nausea or vomiting. 20 tablet 0   OXYGEN Inhale 3 L/min into the lungs continuous.     potassium chloride 20 MEQ/15ML (10%) SOLN Take 15 mLs (20 mEq total) by mouth 2 (two) times daily. (Patient taking differently: Take 20 mEq by mouth daily as needed (Low patassium).) 210 mL 0   PROAIR HFA 108 (90 Base) MCG/ACT inhaler Inhale 1-2 puffs into the lungs every 6 (six) hours as needed for wheezing or shortness of breath. 18 g 3   prochlorperazine (COMPAZINE) 10 MG tablet Take 1 tablet (10 mg total) by mouth every 6 (six) hours as needed. (Patient taking differently: Take 10 mg by mouth every 6 (six) hours as needed (nausea with chemo).) 30 tablet 2   Skin Protectants, Misc. (EUCERIN) cream Apply 1 application topically as needed (Ezcema).     Soap & Cleansers (MEDERMA AG FACIAL TONER) LIQD Apply 1 application topically 2 (two) times daily. Advance dry skin     SYMBICORT 80-4.5 MCG/ACT inhaler INHALE 2 PUFFS BY MOUTH IN THE MORNING AND AT BEDTIME 11 g 0   triamcinolone ointment (KENALOG) 0.1 % Apply 1 application topically in the morning and at bedtime.     No current facility-administered medications for this encounter.    Physical Findings: The patient is in no acute distress. Patient is alert and oriented.  weight is 148 lb 4 oz (67.2 kg). Her blood pressure is 124/62 and her pulse is 87. Her respiration is 19 and oxygen saturation is 100%. .    General: Alert and oriented, in no acute distress HEENT: Head is normocephalic. Extraocular movements are intact. Oropharynx is clear.  Breathing oxygen through nasal cannula Musculoskeletal: symmetric strength and  muscle tone throughout.  Ambulatory. Neurologic: Cranial nerves II through XII are grossly intact. No obvious focalities. Speech is fluent. Coordination is intact. Psychiatric: Judgment and insight are intact. Affect is appropriate.    Lab Findings: Lab Results  Component Value Date   WBC 4.0 02/28/2021   HGB 9.7 (L) 02/28/2021   HCT 30.3 (L) 02/28/2021   MCV 96.2 02/28/2021   PLT 180 02/28/2021    Radiographic Findings: No results found.  Impression/Plan:   She is doing well overall following radiosurgery and will undergo a MRI of her brain in 2 months.  She will follow-up with Dr. Mickeal Skinner at that time.  He will continue to monitor her for some neurologic symptoms that she has been coping with over time.   We talked about the importance of staying up-to-date with vaccinations including those against flu and COVID.  Right now she is  electing not to get vaccinated about COVID.  I explained the risks that COVID poses to people, especially if they are not vaccinated.  I recommend that she does get vaccinated to reduce the risk of morbidity or death from [place].  At this time she does not seem ready to do so but she did appreciate our discussion today and will think about things.  I will see her back as needed and continue to review her imaging with Dr. Mickeal Skinner at [facility] boards.  On date of service, in total, I spent 20 minutes on this encounter. Patient was seen in person.  _____________________________________   Eppie Gibson, MD

## 2021-03-18 ENCOUNTER — Ambulatory Visit (HOSPITAL_COMMUNITY): Admission: RE | Admit: 2021-03-18 | Payer: Medicaid Other | Source: Ambulatory Visit

## 2021-03-18 ENCOUNTER — Telehealth: Payer: Self-pay

## 2021-03-18 NOTE — Telephone Encounter (Signed)
T/C from pt stating her CT scan is scheduled for today at 4:30 and she just picked up the prednisone,  She was suppose to take it 13 hrs prior.    Pt advised to reschedule scan.  Pt with understanding

## 2021-03-21 ENCOUNTER — Inpatient Hospital Stay (HOSPITAL_BASED_OUTPATIENT_CLINIC_OR_DEPARTMENT_OTHER): Payer: Medicaid Other

## 2021-03-21 ENCOUNTER — Inpatient Hospital Stay: Payer: Medicaid Other

## 2021-03-21 ENCOUNTER — Inpatient Hospital Stay: Payer: Medicaid Other | Attending: Internal Medicine | Admitting: Physician Assistant

## 2021-03-21 ENCOUNTER — Other Ambulatory Visit: Payer: Self-pay | Admitting: Hematology and Oncology

## 2021-03-21 ENCOUNTER — Other Ambulatory Visit: Payer: Self-pay

## 2021-03-21 VITALS — BP 153/63 | HR 87 | Temp 97.5°F | Resp 20 | Wt 148.3 lb

## 2021-03-21 DIAGNOSIS — Z79899 Other long term (current) drug therapy: Secondary | ICD-10-CM | POA: Diagnosis not present

## 2021-03-21 DIAGNOSIS — C7931 Secondary malignant neoplasm of brain: Secondary | ICD-10-CM | POA: Diagnosis not present

## 2021-03-21 DIAGNOSIS — R413 Other amnesia: Secondary | ICD-10-CM | POA: Diagnosis not present

## 2021-03-21 DIAGNOSIS — Z95828 Presence of other vascular implants and grafts: Secondary | ICD-10-CM

## 2021-03-21 DIAGNOSIS — R42 Dizziness and giddiness: Secondary | ICD-10-CM | POA: Diagnosis not present

## 2021-03-21 DIAGNOSIS — C3491 Malignant neoplasm of unspecified part of right bronchus or lung: Secondary | ICD-10-CM | POA: Diagnosis not present

## 2021-03-21 DIAGNOSIS — Z5111 Encounter for antineoplastic chemotherapy: Secondary | ICD-10-CM | POA: Diagnosis present

## 2021-03-21 DIAGNOSIS — Z5112 Encounter for antineoplastic immunotherapy: Secondary | ICD-10-CM | POA: Diagnosis present

## 2021-03-21 LAB — CBC WITH DIFFERENTIAL (CANCER CENTER ONLY)
Abs Immature Granulocytes: 0.01 10*3/uL (ref 0.00–0.07)
Basophils Absolute: 0.1 10*3/uL (ref 0.0–0.1)
Basophils Relative: 1 %
Eosinophils Absolute: 0.1 10*3/uL (ref 0.0–0.5)
Eosinophils Relative: 2 %
HCT: 31 % — ABNORMAL LOW (ref 36.0–46.0)
Hemoglobin: 10 g/dL — ABNORMAL LOW (ref 12.0–15.0)
Immature Granulocytes: 0 %
Lymphocytes Relative: 22 %
Lymphs Abs: 0.9 10*3/uL (ref 0.7–4.0)
MCH: 31.5 pg (ref 26.0–34.0)
MCHC: 32.3 g/dL (ref 30.0–36.0)
MCV: 97.8 fL (ref 80.0–100.0)
Monocytes Absolute: 0.4 10*3/uL (ref 0.1–1.0)
Monocytes Relative: 11 %
Neutro Abs: 2.5 10*3/uL (ref 1.7–7.7)
Neutrophils Relative %: 64 %
Platelet Count: 216 10*3/uL (ref 150–400)
RBC: 3.17 MIL/uL — ABNORMAL LOW (ref 3.87–5.11)
RDW: 15.4 % (ref 11.5–15.5)
WBC Count: 3.9 10*3/uL — ABNORMAL LOW (ref 4.0–10.5)
nRBC: 0 % (ref 0.0–0.2)

## 2021-03-21 LAB — MAGNESIUM: Magnesium: 1.7 mg/dL (ref 1.7–2.4)

## 2021-03-21 LAB — CMP (CANCER CENTER ONLY)
ALT: 16 U/L (ref 0–44)
AST: 28 U/L (ref 15–41)
Albumin: 3.5 g/dL (ref 3.5–5.0)
Alkaline Phosphatase: 50 U/L (ref 38–126)
Anion gap: 9 (ref 5–15)
BUN: 12 mg/dL (ref 8–23)
CO2: 27 mmol/L (ref 22–32)
Calcium: 8.9 mg/dL (ref 8.9–10.3)
Chloride: 108 mmol/L (ref 98–111)
Creatinine: 1.12 mg/dL — ABNORMAL HIGH (ref 0.44–1.00)
GFR, Estimated: 56 mL/min — ABNORMAL LOW (ref >60)
Glucose, Bld: 119 mg/dL — ABNORMAL HIGH (ref 70–99)
Potassium: 3.5 mmol/L (ref 3.5–5.1)
Sodium: 144 mmol/L (ref 135–145)
Total Bilirubin: 0.2 mg/dL — ABNORMAL LOW (ref 0.3–1.2)
Total Protein: 7 g/dL (ref 6.5–8.1)

## 2021-03-21 LAB — TSH: TSH: 1.666 u[IU]/mL (ref 0.308–3.960)

## 2021-03-21 MED ORDER — SODIUM CHLORIDE 0.9 % IV SOLN
200.0000 mg | Freq: Once | INTRAVENOUS | Status: AC
  Administered 2021-03-21: 200 mg via INTRAVENOUS
  Filled 2021-03-21: qty 8

## 2021-03-21 MED ORDER — PROCHLORPERAZINE MALEATE 10 MG PO TABS
10.0000 mg | ORAL_TABLET | Freq: Once | ORAL | Status: AC
  Administered 2021-03-21: 10 mg via ORAL
  Filled 2021-03-21: qty 1

## 2021-03-21 MED ORDER — SODIUM CHLORIDE 0.9% FLUSH
10.0000 mL | Freq: Once | INTRAVENOUS | Status: AC
  Administered 2021-03-21: 10 mL

## 2021-03-21 MED ORDER — SODIUM CHLORIDE 0.9 % IV SOLN
500.0000 mg/m2 | Freq: Once | INTRAVENOUS | Status: AC
  Administered 2021-03-21: 900 mg via INTRAVENOUS
  Filled 2021-03-21: qty 20

## 2021-03-21 MED ORDER — ACETAMINOPHEN 325 MG PO TABS
650.0000 mg | ORAL_TABLET | Freq: Once | ORAL | Status: AC
  Administered 2021-03-21: 650 mg via ORAL
  Filled 2021-03-21: qty 2

## 2021-03-21 MED ORDER — HEPARIN SOD (PORK) LOCK FLUSH 100 UNIT/ML IV SOLN
500.0000 [IU] | Freq: Once | INTRAVENOUS | Status: AC | PRN
  Administered 2021-03-21: 500 [IU]

## 2021-03-21 MED ORDER — SODIUM CHLORIDE 0.9 % IV SOLN: Freq: Once | INTRAVENOUS | Status: AC

## 2021-03-21 MED ORDER — SODIUM CHLORIDE 0.9% FLUSH
10.0000 mL | INTRAVENOUS | Status: DC | PRN
  Administered 2021-03-21: 10 mL

## 2021-03-21 NOTE — Progress Notes (Signed)
[facility] [phone]   [phone]  PROGRESS NOTE  Patient Care Team: Minette Brine, FNP as PCP - General (General Practice)  Hematological/Oncological History # Metastatic Adenocarcinoma of the Lung #Brain Metastasis 1) 11/08/2019: establish care with Dr. Julien Nordmann and his PA Cassie Heilingoetter. Noted to have widely metastatic lung cancer with involvement of the brain, lymph nodes, and right lung.  2) 12/04/2019: palliative radiation to the right lung mass/cervical adenopathy 3) 9/7/02021: started therapy with Carbo/Pem/Pem 4) 9/29-10/09/2019: patient underwent SRS to the brain mets 5) 03/22/2020: repeat CT C/A/P and neck scheduled. Transfer care to Dr. Lorenso Courier.   6) 03/22/2020: CT C/A/P showed interval significant improvement in previously demonstrated extensive confluent lymphadenopathy in the superior mediastinum and right hilar regions. 7) 08/15/2020: Holding chemotherapy due to poor Hgb and fatigue. Allowing patient a brief 'chemo holiday' to rebound appropriately from last cycle.  8) 08/29/2020: restarted chemotherapy, Cycle 12 Day 1 9) 09/19/2020: patient requested to HOLD Cycle 13 in setting of fatigue/weakness.  10) 11/29/2020: Cycle 14 Day 1 of chemotherapy, delayed start due to insurance issues.   Interval History:  Elizabeth Mayer 60 y.o. female with medical history significant for metastatic adenocarcinoma of the lung who presents for follow up. The patient's last visit was on 02/28/2021 when she received Cycle 18, Day 1 of pem/pem maintenance.  In the interim since her last visit she has had no other changed in her health.   On exam today Elizabeth Mayer reports that her energy levels are fairly stable.  She continues to complete her daily activities on her own.  Patient has a good appetite without any recent weight changes.  She denies any nausea or vomiting episodes.  Patient reports intermittent episodes of mid abdominal pain without any known  triggers.  Patient reports constipation and has a bowel movement every other day.  She is currently on stool softeners with improvement of symptoms and denies any straining.  Patient denies easy bruising or signs of bleeding.  She reports intermittent episodes of palpitations that was more noticeable when she exerted herself.  She denies any chest pain or changes in her breathing.  She is currently using 3 L of oxygen.  Patient reports occasional episodes of dizziness that she feels is more frequent but does not last as long as prior.  She denies any syncopal episodes.  Patient reports noticing memory loss in the last 2 to 3 weeks.  She has difficulty forgetting tasks during the day and difficulty with word recognition.  Patient continues to have floaters in the lower field of her left eye that comes and goes.  She denies any fevers, chills, night sweats, chest pain or cough. She has no other complaints.  A full 10 point ROS is listed below.  MEDICAL HISTORY:  Past Medical History:  Diagnosis Date   Anemia    Angina 1982   related to stress   Asthma    in the past,  no current problems   Complication of anesthesia    states anesthesia made her hair fall out   Constipation    Dyspnea    on oxygen at home - 2L via [place]   Fatigue    GERD (gastroesophageal reflux disease)    patient denies this dx   Headache    Heart murmur 1970s   no problems currently   Ingrown toenail    Lung cancer ([place]) 10/2019   metastatic disease to the brain   On home oxygen therapy  2.2 lpm, 24 hours a day   Past heart attack 1980-1981   pt states she passed out and woke up in hospital- told she had heart attack, but then dr said he couldn't find anything wrong.   Pneumonia    x 1    SURGICAL HISTORY: Past Surgical History:  Procedure Laterality Date   CERVICAL DISC SURGERY  2000   Disc removed from neck    ingrown toe nail surgery Bilateral    IR IMAGING GUIDED PORT INSERTION  02/16/2020   MULTIPLE TOOTH  EXTRACTIONS     for braces   RADIOLOGY WITH ANESTHESIA N/A 12/05/2019   Procedure: MRI BRAIN WITH AND WITHOUT CONTRAST;  Surgeon: Radiologist, Medication, MD;  Location: [facility];  Service: Radiology;  Laterality: N/A;   RADIOLOGY WITH ANESTHESIA N/A 01/02/2020   Procedure: MRI BRAIN WITH AND WITHOUT CONTRAST;  Surgeon: Radiologist, Medication, MD;  Location: [facility];  Service: Radiology;  Laterality: N/A;   RADIOLOGY WITH ANESTHESIA N/A 02/20/2020   Procedure: MRI WITH ANESTHESIA OF BRAIN WITH AND WITHOUT CONTRAST;  Surgeon: Radiologist, Medication, MD;  Location: [facility];  Service: Radiology;  Laterality: N/A;   RADIOLOGY WITH ANESTHESIA N/A 06/11/2020   Procedure: MRI WITH ANESTHESIA OF BRAIN WITH AND WITHOUT CONTRAST;  Surgeon: Radiologist, Medication, MD;  Location: [facility];  Service: Radiology;  Laterality: N/A;   RADIOLOGY WITH ANESTHESIA N/A 10/10/2020   Procedure: MRI WITH ANESTHESIA BRAIN WITH AND WITHOUT CONTRAST;  Surgeon: Radiologist, Medication, MD;  Location: [facility];  Service: Radiology;  Laterality: N/A;   RADIOLOGY WITH ANESTHESIA N/A 01/30/2021   Procedure: MRI BRAIN WITH AND WITHOUT CONTRASTWITH ANESTHESIA;  Surgeon: Radiologist, Medication, MD;  Location: [facility];  Service: Radiology;  Laterality: N/A;   TONSILLECTOMY      SOCIAL HISTORY: Social History   Socioeconomic History   Marital status: Legally Separated    Spouse name: Not on file   Number of children: 0   Years of education: Not on file   Highest education level: Not on file  Occupational History   Occupation: Secondary school teacher  Tobacco Use   Smoking status: Former    Packs/day: 1.00    Years: 20.00    Pack years: 20.00    Types: Cigarettes    Start date: 72    Quit date: 2001    Years since quitting: 21.9   Smokeless tobacco: Never  Vaping Use   Vaping Use: Never used  Substance and Sexual Activity   Alcohol use: No   Drug use: No   Sexual activity: Yes    Birth control/protection: None, Post-menopausal   Other Topics Concern   Not on file  Social History Narrative   Not on file   Social Determinants of Health   Financial Resource Strain: Not on file  Food Insecurity: Not on file  Transportation Needs: Not on file  Physical Activity: Not on file  Stress: Not on file  Social Connections: Not on file  Intimate Partner Violence: Not on file    FAMILY HISTORY: Family History  Problem Relation Age of Onset   Arthritis Mother    Heart failure Father     ALLERGIES:  is allergic to pseudoephedrine, contrast media [iodinated diagnostic agents], gabapentin, mobic [meloxicam], and penicillins.  MEDICATIONS:  Current Outpatient Medications  Medication Sig Dispense Refill   acetaminophen (TYLENOL) 500 MG tablet Take 1,000 mg by mouth every 6 (six) hours as needed for moderate pain. Taking BID     benzoyl peroxide (CERAVE ACNE FOAMING  CREAM) 4 % external liquid Apply 1 application topically 2 (two) times daily. Face     camphor-menthol (SARNA) lotion Apply 1 application topically daily as needed for itching.     Carboxymethylcellulose Sodium (REFRESH CELLUVISC OP) Place 2 drops into both eyes See admin instructions. Instill 2  drop into both eyes twice daily (scheduled) & then use as needed throughout the day for dry/irritated eyes     clindamycin (CLEOCIN T) 1 % lotion Apply 1 application topically 2 (two) times daily.     Cyanocobalamin (B-12 COMPLIANCE INJECTION) 1000 MCG/ML KIT Inject 1,000 mcg as directed See admin instructions. As needed give at Chemo     cyclobenzaprine (FLEXERIL) 10 MG tablet Take 1 tablet (10 mg total) by mouth 3 (three) times daily as needed for muscle spasms. 30 tablet 0   fexofenadine (ALLEGRA) 180 MG tablet Take 180 mg by mouth in the morning.     fluticasone (FLONASE) 50 MCG/ACT nasal spray Place 1 spray into both nostrils daily.     guaiFENesin (MUCINEX) 600 MG 12 hr tablet Take 600 mg by mouth 2 (two) times daily.      HYDROcodone-acetaminophen  (NORCO/VICODIN) 5-325 MG tablet Take 1 tablet by mouth every 6 (six) hours as needed for moderate pain. 30 tablet 0   lidocaine (XYLOCAINE) 2 % solution Use as directed 15 mLs in the mouth or throat as needed for mouth pain. Swallow 30 min prior to meals and at bedtime for throat/esophagus pain 250 mL 1   lidocaine-prilocaine (EMLA) cream Apply 1 application topically as needed. (Patient taking differently: Apply 1 application topically as needed (port access). Every three weeks) 30 g 2   Multiple Vitamin (MULTIVITAMIN WITH MINERALS) TABS tablet Take 2 tablets by mouth daily. Alive/ gummies     ondansetron (ZOFRAN) 8 MG tablet Take 1 tablet (8 mg total) by mouth every 8 (eight) hours as needed for nausea or vomiting. 20 tablet 0   OXYGEN Inhale 3 L/min into the lungs continuous.     potassium chloride 20 MEQ/15ML (10%) SOLN Take 15 mLs (20 mEq total) by mouth 2 (two) times daily. (Patient taking differently: Take 20 mEq by mouth daily as needed (Low patassium).) 210 mL 0   predniSONE (DELTASONE) 50 MG tablet Take 1 tablet orally 13 hours, 7 hours and 1 hour before CT scan on 03/18/21 3 tablet 0   PROAIR HFA 108 (90 Base) MCG/ACT inhaler Inhale 1-2 puffs into the lungs every 6 (six) hours as needed for wheezing or shortness of breath. 18 g 3   prochlorperazine (COMPAZINE) 10 MG tablet Take 1 tablet (10 mg total) by mouth every 6 (six) hours as needed. (Patient taking differently: Take 10 mg by mouth every 6 (six) hours as needed (nausea with chemo).) 30 tablet 2   Skin Protectants, Misc. (EUCERIN) cream Apply 1 application topically as needed (Ezcema).     Soap & Cleansers (MEDERMA AG FACIAL TONER) LIQD Apply 1 application topically 2 (two) times daily. Advance dry skin     SYMBICORT 80-4.5 MCG/ACT inhaler INHALE 2 PUFFS BY MOUTH IN THE MORNING AND AT BEDTIME 11 g 0   triamcinolone ointment (KENALOG) 0.1 % Apply 1 application topically in the morning and at bedtime.     folic acid (FOLVITE) 1 MG tablet  Take 1 tablet by mouth once daily 90 tablet 0   No current facility-administered medications for this visit.    REVIEW OF SYSTEMS:   Constitutional: ( - ) fevers, ( - )  chills , ( - ) night sweats Eyes: ( - ) blurriness of vision, ( - ) double vision, ( - ) watery eyes Ears, nose, mouth, throat, and face: ( - ) mucositis, ( - ) sore throat Respiratory: ( - ) cough, ( - ) dyspnea, ( - ) wheezes Cardiovascular: ( + ) palpitation, ( - ) chest discomfort, ( - ) lower extremity swelling Gastrointestinal:  ( - ) nausea, ( - ) heartburn, ( - ) change in bowel habits Skin: ( - ) abnormal skin rashes Lymphatics: ( - ) new lymphadenopathy, ( - ) easy bruising Neurological: ( - ) numbness, ( - ) tingling, ( - ) new weaknesses Behavioral/Psych: ( - ) mood change, ( - ) new changes  All other systems were reviewed with the patient and are negative.  PHYSICAL EXAMINATION: ECOG PERFORMANCE STATUS: 2 - Symptomatic, <50% confined to bed  Vitals:   03/21/21 1029  BP: (!) 153/63  Pulse: 87  Resp: 20  Temp: (!) 97.5 F (36.4 C)  SpO2: 100%     Filed Weights   03/21/21 1029  Weight: 148 lb 4.8 oz (67.3 kg)     GENERAL: well appearing middle aged [place] American female. alert, no distress and comfortable SKIN: skin color, texture, turgor are normal, no rashes or significant lesions EYES: conjunctiva are pink and non-injected, sclera clear LUNGS: clear to auscultation and percussion with normal breathing effort HEART: regular rate & rhythm and no murmurs and no lower extremity edema Musculoskeletal: no cyanosis of digits and no clubbing.  PSYCH: alert & oriented x 3, fluent speech NEURO: no focal motor/sensory deficits   LABORATORY DATA:  I have reviewed the data as listed CBC Latest Ref Rng & Units 03/21/2021 02/28/2021 02/07/2021  WBC 4.0 - 10.5 K/uL 3.9(L) 4.0 4.5  Hemoglobin 12.0 - 15.0 g/dL 10.0(L) 9.7(L) 9.5(L)  Hematocrit 36.0 - 46.0 % 31.0(L) 30.3(L) 29.9(L)  Platelets 150 -  400 K/uL 216 180 182    CMP Latest Ref Rng & Units 03/21/2021 02/28/2021 02/07/2021  Glucose 70 - 99 mg/dL 119(H) 90 80  BUN 8 - 23 mg/dL 12 9 15   Creatinine 0.44 - 1.00 mg/dL 1.12(H) 1.15(H) 0.87  Sodium 135 - 145 mmol/L 144 145 143  Potassium 3.5 - 5.1 mmol/L 3.5 3.7 3.8  Chloride 98 - 111 mmol/L 108 108 108  CO2 22 - 32 mmol/L 27 27 27   Calcium 8.9 - 10.3 mg/dL 8.9 9.0 8.7(L)  Total Protein 6.5 - 8.1 g/dL 7.0 6.7 6.4(L)  Total Bilirubin 0.3 - 1.2 mg/dL 0.2(L) 0.3 <0.2(L)  Alkaline Phos 38 - 126 U/L 50 50 47  AST 15 - 41 U/L 28 31 20   ALT 0 - 44 U/L 16 23 12     RADIOGRAPHIC STUDIES: No results found.   ASSESSMENT & PLAN Elizabeth Mayer 60 y.o. female with medical history significant for metastatic adenocarcinoma of the lung who presents for follow up.    After review the labs, the records, discussion with the patient the findings are most consistent with metastatic adenocarcinoma the lung without any targetable mutations.  The patient has been started on carboplatin pemetrexed pembrolizumab every 3 weeks with Dr. Julien Nordmann, however the patient has expressed a desire to transition to another provider.  As such we were happy to take up her care here.  She already discontinued the carboplatin and is on maintenance pemetrexed/pembrolizumab therapy. We assumed her care with Cycle 6 on 04/04/2020.  # Metastatic Adenocarcinoma of the Lung #Brain Metastasis  --  no targetable mutations noted on prior workup. --continue to follow with Dr. Laqueta Due Onc for metastatic spread to the brain.  --monitor CMP, CBC, and TSH with each treatment --can consider docetaxel/ramucirumab at time of progression.  Plan: --CT scan shows stable disease in Sept 2022. Next one schedule for 03/27/2021.  --today is Cycle 19 Day 1 of maintenance Pem/Pem --Labs today were reviewed and adequate for treatment today.  --RTC in 3 weeks for Cycle 20 of chemotherapy  #Chemotherapy Induced Anemia --Hgb 10.0 today,  stable from prior --continue to monitor   # Fatigue: --Uncertain etiology but likely chemotherapy/immunotherapy side effects  --Monitor closely  # Mid back pain: --no findings of metastasis to spine or structural problems with back on last imaging.    #Memory changes/vision changes/dizziness --Memory loss present for the last 2-3 weeks, mainly with word recognition and difficulty remembering daily tasks.  --Floaters in lower visual field of left eye, occasional.  --Dizziness is more frequent but shorter in duration.  --will request patient to follow up with Dr. Mickeal Skinner in the next 3-4 weeks as she is due for repeat MRI scan of the brain.   No orders of the defined types were placed in this encounter.   All questions were answered. The patient knows to call the clinic with any problems, questions or concerns.  I have spent a total of 30 minutes minutes of face-to-face and non-face-to-face time, preparing to see the patient, obtaining and/or reviewing separately obtained history, performing a medically appropriate examination, counseling and educating the patient, ordering tests, documenting clinical information in the electronic health record and care coordination.   Lincoln Brigham Department of Hematology/Oncology [place] at [facility] Phone: [phone]    03/24/2021 9:42 AM

## 2021-03-21 NOTE — Progress Notes (Signed)
Pt informed RN that she started expericing lower back pain of 7/10. This is not new. RN applied heat to the area. Heat did not help w/ pt's pain. Pt requested PO tylenol if at all possible.  RN made [place], [place] aware.  RN received verbal order for 650 mg PO tylenol to be given in inf today.

## 2021-03-21 NOTE — Progress Notes (Signed)
Port Site -Pain- Dressing is pulled taut and pulling needle downward. Reposition dressing and pain resolved.

## 2021-03-21 NOTE — Patient Instructions (Signed)
[facility] ONCOLOGY  Discharge Instructions: Thank you for choosing [facility] to provide your oncology and hematology care.   If you have a lab appointment with the [facility], please go directly to the [facility] and check in at the registration area.   Wear comfortable clothing and clothing appropriate for easy access to any Portacath or PICC line.   We strive to give you quality time with your provider. You may need to reschedule your appointment if you arrive late (15 or more minutes).  Arriving late affects you and other patients whose appointments are after yours.  Also, if you miss three or more appointments without notifying the office, you may be dismissed from the clinic at the providers discretion.      For prescription refill requests, have your pharmacy contact our office and allow 72 hours for refills to be completed.    Today you received the following chemotherapy and/or immunotherapy agents:  Pembrolizumab,  Pemetrexed.   To help prevent nausea and vomiting after your treatment, we encourage you to take your nausea medication as directed.  BELOW ARE SYMPTOMS THAT SHOULD BE REPORTED IMMEDIATELY: *FEVER GREATER THAN 100.4 F (38 C) OR HIGHER *CHILLS OR SWEATING *NAUSEA AND VOMITING THAT IS NOT CONTROLLED WITH YOUR NAUSEA MEDICATION *UNUSUAL SHORTNESS OF BREATH *UNUSUAL BRUISING OR BLEEDING *URINARY PROBLEMS (pain or burning when urinating, or frequent urination) *BOWEL PROBLEMS (unusual diarrhea, constipation, pain near the anus) TENDERNESS IN MOUTH AND THROAT WITH OR WITHOUT PRESENCE OF ULCERS (sore throat, sores in mouth, or a toothache) UNUSUAL RASH, SWELLING OR PAIN  UNUSUAL VAGINAL DISCHARGE OR ITCHING   Items with * indicate a potential emergency and should be followed up as soon as possible or go to the Emergency Department if any problems should occur.  Please show the CHEMOTHERAPY ALERT CARD or IMMUNOTHERAPY ALERT CARD  at check-in to the Emergency Department and triage nurse.  Should you have questions after your visit or need to cancel or reschedule your appointment, please contact [facility]  Dept: [phone]  and follow the prompts.  Office hours are 8:00 a.m. to 4:30 p.m. Monday - Friday. Please note that voicemails left after 4:00 p.m. may not be returned until the following business day.  We are closed weekends and major holidays. You have access to a nurse at all times for urgent questions. Please call the main number to the clinic Dept: [phone] and follow the prompts.   For any non-urgent questions, you may also contact your provider using MyChart. We now offer e-Visits for anyone 8 and older to request care online for non-urgent symptoms. For details visit [email or web address].   Also download the MyChart app! Go to the app store, search "MyChart", open the app, select [facility], and log in with your MyChart username and password.  Due to Covid, a mask is required upon entering the hospital/clinic. If you do not have a mask, one will be given to you upon arrival. For doctor visits, patients may have 1 support person aged 59 or older with them. For treatment visits, patients cannot have anyone with them due to current Covid guidelines and our immunocompromised population.

## 2021-03-23 ENCOUNTER — Other Ambulatory Visit: Payer: Self-pay

## 2021-03-24 ENCOUNTER — Other Ambulatory Visit: Payer: Self-pay | Admitting: Hematology and Oncology

## 2021-03-24 ENCOUNTER — Encounter: Payer: Self-pay | Admitting: Internal Medicine

## 2021-03-24 ENCOUNTER — Encounter: Payer: Self-pay | Admitting: Physician Assistant

## 2021-03-25 ENCOUNTER — Encounter: Payer: Self-pay | Admitting: Nurse Practitioner

## 2021-03-25 ENCOUNTER — Other Ambulatory Visit: Payer: Self-pay

## 2021-03-25 ENCOUNTER — Emergency Department (HOSPITAL_COMMUNITY): Payer: Medicaid Other

## 2021-03-25 ENCOUNTER — Emergency Department (HOSPITAL_COMMUNITY)
Admission: EM | Admit: 2021-03-25 | Discharge: 2021-03-25 | Disposition: A | Discharge status: Home / Self Care | Payer: Medicaid Other | Attending: Emergency Medicine | Admitting: Emergency Medicine

## 2021-03-25 DIAGNOSIS — Z85118 Personal history of other malignant neoplasm of bronchus and lung: Secondary | ICD-10-CM | POA: Insufficient documentation

## 2021-03-25 DIAGNOSIS — Z87891 Personal history of nicotine dependence: Secondary | ICD-10-CM | POA: Insufficient documentation

## 2021-03-25 DIAGNOSIS — J449 Chronic obstructive pulmonary disease, unspecified: Secondary | ICD-10-CM | POA: Diagnosis not present

## 2021-03-25 DIAGNOSIS — Z7951 Long term (current) use of inhaled steroids: Secondary | ICD-10-CM | POA: Insufficient documentation

## 2021-03-25 DIAGNOSIS — J45909 Unspecified asthma, uncomplicated: Secondary | ICD-10-CM | POA: Insufficient documentation

## 2021-03-25 DIAGNOSIS — R000 Tachycardia, unspecified: Secondary | ICD-10-CM | POA: Diagnosis present

## 2021-03-25 DIAGNOSIS — I480 Paroxysmal atrial fibrillation: Secondary | ICD-10-CM | POA: Insufficient documentation

## 2021-03-25 LAB — CBC
HCT: 34 % — ABNORMAL LOW (ref 36.0–46.0)
Hemoglobin: 10.9 g/dL — ABNORMAL LOW (ref 12.0–15.0)
MCH: 32 pg (ref 26.0–34.0)
MCHC: 32.1 g/dL (ref 30.0–36.0)
MCV: 99.7 fL (ref 80.0–100.0)
Platelets: 183 10*3/uL (ref 150–400)
RBC: 3.41 MIL/uL — ABNORMAL LOW (ref 3.87–5.11)
RDW: 14.9 % (ref 11.5–15.5)
WBC: 5.1 10*3/uL (ref 4.0–10.5)
nRBC: 0 % (ref 0.0–0.2)

## 2021-03-25 LAB — BASIC METABOLIC PANEL
Anion gap: 10 (ref 5–15)
BUN: 14 mg/dL (ref 8–23)
CO2: 23 mmol/L (ref 22–32)
Calcium: 9.3 mg/dL (ref 8.9–10.3)
Chloride: 105 mmol/L (ref 98–111)
Creatinine, Ser: 1.08 mg/dL — ABNORMAL HIGH (ref 0.44–1.00)
GFR, Estimated: 58 mL/min — ABNORMAL LOW (ref >60)
Glucose, Bld: 189 mg/dL — ABNORMAL HIGH (ref 70–99)
Potassium: 4.6 mmol/L (ref 3.5–5.1)
Sodium: 138 mmol/L (ref 135–145)

## 2021-03-25 LAB — MAGNESIUM: Magnesium: 1.8 mg/dL (ref 1.7–2.4)

## 2021-03-25 MED ORDER — DILTIAZEM HCL 30 MG PO TABS: 30.0000 mg | ORAL_TABLET | Freq: Four times a day (QID) | ORAL | 1 refills | Status: DC

## 2021-03-25 MED ORDER — DILTIAZEM HCL 30 MG PO TABS
30.0000 mg | ORAL_TABLET | Freq: Once | ORAL | Status: AC
  Administered 2021-03-25: 08:00:00 30 mg via ORAL
  Filled 2021-03-25: qty 1

## 2021-03-25 MED ORDER — DILTIAZEM HCL-DEXTROSE 125-5 MG/125ML-% IV SOLN (PREMIX)
5.0000 mg/h | INTRAVENOUS | Status: DC
Start: 2021-03-25 — End: 2021-03-25
  Administered 2021-03-25: 06:00:00 5 mg/h via INTRAVENOUS
  Filled 2021-03-25: qty 125

## 2021-03-25 MED ORDER — DILTIAZEM LOAD VIA INFUSION
15.0000 mg | Freq: Once | INTRAVENOUS | Status: AC
  Administered 2021-03-25: 06:00:00 15 mg via INTRAVENOUS
  Filled 2021-03-25: qty 15

## 2021-03-25 NOTE — Discharge Instructions (Addendum)
You were seen today for a high heart rate.  You are found to be in atrial fibrillation.  You need to follow-up with cardiology - a referral was placed to the A Fib clinic, and they should contact you in 2 business days to schedule a follow up appointment.  You will be started on a heart rate control medication called diltiazem.  At this time you will not be started on anticoagulation (blood thinners).  Today you need to call Radiology scheduling and tell them a spot is being held Thursday at 10 am at [facility] for CT scan.  You can direct them to ask "Verdis Frederickson," whom I spoke to on the phone.

## 2021-03-25 NOTE — ED Triage Notes (Signed)
Pt arrived from home with c/c of rapid heart rate. Denies chest pain, shob. Hx of lung cancer with last chemo Friday. Pt took prednisone yesterday for a schedule scan today. Chronic 3L O2 via [place]   140-180s HR, 160/100 20mg  Cardizem, 555ml NSS
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
## 2021-03-27 ENCOUNTER — Encounter: Payer: Self-pay | Admitting: Physician Assistant

## 2021-03-27 ENCOUNTER — Ambulatory Visit (HOSPITAL_BASED_OUTPATIENT_CLINIC_OR_DEPARTMENT_OTHER): Payer: Medicaid Other

## 2021-03-27 ENCOUNTER — Encounter: Payer: Self-pay | Admitting: Internal Medicine

## 2021-03-27 ENCOUNTER — Telehealth: Payer: Self-pay | Admitting: *Deleted

## 2021-03-27 NOTE — Telephone Encounter (Signed)
Received call from pt to discuss her "allergy" to IV contrast.  Pt states that she has had many CT scans with IV contrast without any reactions. Then in September she had another scan (apparently a different brand of IV dye) and felt dizzy and had generalized burning sensation.  At that point, she was listed as allergic the the dye. Pt states she had had her chemo that week and had eaten some new foods.  She went to have another scan and had to take steroids before the scan-Prednisone 50 mg 13 hours, 7 hours and 1 hour prior to scan. After the 2nd dose pt experienced rapid heart rate and went to the ED. She was found to be Afib and RVR and requiring cardiazem to convert to NSR. She has not had any other similar episodes.  Pt states she refuses to take the steroids for her current scan as she is concerned of a repeat episode of AFib.  She states several times that she has not had any problems with previous scans.  Discussed the above with Dr. Lorenso Courier.   Pt is aware of potential risks of doing scan without pre-medications. Per Dr, Lorenso Courier, ok to proceed with scan with no pre meds.  We will remove the IV contrast dye allergy in her record.

## 2021-03-27 NOTE — Progress Notes (Signed)
° °                                                                                                                                                          °  Patient Name: Elizabeth Mayer MRN: [id number] DOB: 24-Nov-1960 Referring Physician: Minette Brine (Profile Not Attached) Date of Service: 02/11/2021 [facility], [place]                                                        End Of Treatment Note  Diagnoses: C79.31-Secondary malignant neoplasm of brain  Cancer Staging:  Cancer Staging  No matching staging information was found for the patient. STAGE IV  Intent: Palliative  Radiation Treatment Dates: 02/11/2021 through 02/11/2021 Site Technique Total Dose (Gy) Dose per Fx (Gy) Completed Fx Beam Energies  Brain: Brain_SRS 3D 20/20 20 1/1 [id number]   Narrative: The patient tolerated radiation therapy relatively well.   Plan: The patient will follow-up with radiation oncology in 8mo .  -----------------------------------  Eppie Gibson, MD

## 2021-04-02 ENCOUNTER — Other Ambulatory Visit: Payer: Self-pay

## 2021-04-02 ENCOUNTER — Ambulatory Visit (INDEPENDENT_AMBULATORY_CARE_PROVIDER_SITE_OTHER): Payer: Medicaid Other | Admitting: Nurse Practitioner

## 2021-04-02 ENCOUNTER — Ambulatory Visit (HOSPITAL_COMMUNITY)
Admission: RE | Admit: 2021-04-02 | Discharge: 2021-04-02 | Disposition: A | Discharge status: Home / Self Care | Payer: Medicaid Other | Source: Ambulatory Visit | Attending: Nurse Practitioner | Admitting: Nurse Practitioner

## 2021-04-02 VITALS — BP 134/76 | HR 74 | Temp 97.8°F | Ht 65.0 in | Wt 149.0 lb

## 2021-04-02 VITALS — BP 168/80 | HR 77 | Ht 65.0 in | Wt 148.2 lb

## 2021-04-02 DIAGNOSIS — I4891 Unspecified atrial fibrillation: Secondary | ICD-10-CM

## 2021-04-02 DIAGNOSIS — Z9981 Dependence on supplemental oxygen: Secondary | ICD-10-CM | POA: Diagnosis not present

## 2021-04-02 DIAGNOSIS — J9611 Chronic respiratory failure with hypoxia: Secondary | ICD-10-CM | POA: Insufficient documentation

## 2021-04-02 DIAGNOSIS — Z79899 Other long term (current) drug therapy: Secondary | ICD-10-CM | POA: Diagnosis not present

## 2021-04-02 DIAGNOSIS — C3490 Malignant neoplasm of unspecified part of unspecified bronchus or lung: Secondary | ICD-10-CM | POA: Diagnosis not present

## 2021-04-02 DIAGNOSIS — I10 Essential (primary) hypertension: Secondary | ICD-10-CM

## 2021-04-02 DIAGNOSIS — I4892 Unspecified atrial flutter: Secondary | ICD-10-CM | POA: Insufficient documentation

## 2021-04-02 DIAGNOSIS — I483 Typical atrial flutter: Secondary | ICD-10-CM | POA: Diagnosis not present

## 2021-04-02 DIAGNOSIS — R9431 Abnormal electrocardiogram [ECG] [EKG]: Secondary | ICD-10-CM | POA: Insufficient documentation

## 2021-04-02 MED ORDER — AMLODIPINE BESYLATE 5 MG PO TABS: 5.0000 mg | ORAL_TABLET | Freq: Every day | ORAL | 11 refills | Status: DC

## 2021-04-02 MED ORDER — AMLODIPINE BESYLATE 5 MG PO TABS: 5.0000 mg | ORAL_TABLET | Freq: Every day | ORAL | 2 refills | Status: DC

## 2021-04-02 NOTE — Patient Instructions (Signed)
Preventing Hypertension Hypertension, also called high blood pressure, is when the force of blood pumping through the arteries is too strong. Arteries are blood vessels that carry blood from the heart throughout the body. Often, hypertension does not cause symptoms until blood pressure is very high. It is important to have your blood pressure checked regularly. Diet and lifestyle changes can help you prevent hypertension, and they may make you feel better overall and improve your quality of life. If you already have hypertension, you may control it with diet and lifestyle changes, as well as with medicine. How can this condition affect me? Over time, hypertension can damage the arteries and decrease blood flow to important parts of the body, including the brain, heart, and kidneys. By keeping your blood pressure in a healthy range, you can help prevent complications like heart attack, heart failure, stroke, kidney failure, and vascular dementia. What can increase my risk? Being an older adult. Older people are more often affected. Having family members who have had high blood pressure. Being obese. Being female. Males are more likely to have high blood pressure. Drinking too much alcohol or caffeine. Smoking or using illegal drugs. Taking certain medicines, such as antidepressants, decongestants, birth control pills, and NSAIDs, such as ibuprofen. Having thyroid problems. Having certain tumors. What actions can I take to prevent or manage this condition? Work with your health care provider to make a hypertension prevention plan that works for you. Follow your plan and keep all follow-up visits as told by your health care provider. Diet changes Maintain a healthy diet. This includes: Eating less salt (sodium). Ask your health care provider how much sodium is safe for you to have. The general recommendation is to have less than 1 tsp (2,300 mg) of sodium a day. Do not add salt to your food. Choose  low-sodium options when grocery shopping and eating out. Limiting fats in your diet. You can do this by eating low-fat or fat-free dairy products and by eating less red meat. Eating more fruits, vegetables, and whole grains. Make a goal to eat: 1-2 cups of fresh fruits and vegetables each day. 3-4 servings of whole grains each day. Avoiding foods and beverages that have added sugars. Eating fish that contain healthy fats (omega-3 fatty acids), such as mackerel or salmon. If you need help putting together a healthy eating plan, try the DASH diet. This diet is high in fruits, vegetables, and whole grains. It is low in sodium, red meat, and added sugars. DASH stands for Dietary Approaches to Stop Hypertension. Lifestyle changes Lose weight if you are overweight. Losing just 3?5% of your body weight can help prevent or control hypertension. For example, if your present weight is 200 lb (91 kg), a loss of 3-5% of your weight means losing 6-10 lb (2.7-4.5 kg). Ask your health care provider to help you with a diet and exercise plan to safely lose weight. Other recommendations usually include: Get enough exercise. Do at least 150 minutes of moderate-intensity exercise each week. You could do this in short exercise sessions several times a day, or you could do longer exercise sessions a few times a week. For example, you could take a brisk 10-minute walk or bike ride, 3 times a day, for 5 days a week. Find ways to reduce stress, such as exercising, meditating, listening to music, or taking a yoga class. If you need help reducing stress, ask your health care provider. Do not use any products that contain nicotine or tobacco, such   as cigarettes, e-cigarettes, and chewing tobacco. If you need help quitting, ask your health care provider. Chemicals in tobacco and nicotine products raise your blood pressure each time you use them. If you need help quitting, ask your health care provider. Learn how to check your  blood pressure at home. Make sure that you know your personal target blood pressure, as told by your health care provider. Try to sleep 7-9 hours per night.  Alcohol use Do not drink alcohol if: Your health care provider tells you not to drink. You are pregnant, may be pregnant, or are planning to become pregnant. If you drink alcohol: Limit how much you use to: 0-1 drink a day for women. 0-2 drinks a day for men. Be aware of how much alcohol is in your drink. In the [place] one drink equals one 12 oz bottle of beer (355 mL), one 5 oz glass of wine (148 mL), or one 1 oz glass of hard liquor (44 mL). Medicines In addition to diet and lifestyle changes, your health care provider may recommend medicines to help lower your blood pressure. In general: You may need to try a few different medicines to find what works best for you. You may need to take more than one medicine. Take over-the-counter and prescription medicines only as told by your health care provider. Questions to ask your health care provider What is my blood pressure goal? How can I lower my risk for high blood pressure? How should I monitor my blood pressure at home? Where to find support Your health care provider can help you prevent hypertension and help you keep your blood pressure at a healthy level. Your local hospital or your community may also provide support services and prevention programs. The American Heart Association offers an online support network at supportnetwork.heart.org Where to find more information Learn more about hypertension from: National Heart, Lung, and Blood Institute: www.nhlbi.nih.gov Centers for Disease Control and Prevention: www.cdc.gov American Academy of Family Physicians: familydoctor.org Learn more about the DASH diet from: National Heart, Lung, and Blood Institute: www.nhlbi.nih.gov Contact a health care provider if: You think you are having a reaction to medicines you have taken. You  have recurrent headaches or feel dizzy. You have swelling in your ankles. You have trouble with your vision. Get help right away if: You have sudden, severe chest, back, or abdominal pain or discomfort. You have shortness of breath. You have a sudden, severe headache. These symptoms may represent a serious problem that is an emergency. Do not wait to see if the symptoms will go away. Get medical help right away. Call your local emergency services (911 in the [place] Do not drive yourself to the hospital.  Summary Hypertension often does not cause any symptoms until blood pressure is very high. It is important to get your blood pressure checked regularly. Diet and lifestyle changes are important steps in preventing hypertension. By keeping your blood pressure in a healthy range, you may prevent complications like heart attack, heart failure, stroke, and kidney failure. Work with your health care provider to make a hypertension prevention plan that works for you. This information is not intended to replace advice given to you by your health care provider. Make sure you discuss any questions you have with your health care provider. Document Revised: 02/21/2019 Document Reviewed: 02/21/2019 Elsevier Patient Education  2022 [employer].  

## 2021-04-02 NOTE — Progress Notes (Signed)
I,Tianna Badgett,acting as a Education administrator for [employer], NP.,have documented all relevant documentation on the behalf of [employer], NP,as directed by  Bary Castilla, NP while in the presence of Bary Castilla, NP.  This visit occurred during the SARS-CoV-2 public health emergency.  Safety protocols were in place, including screening questions prior to the visit, additional usage of staff PPE, and extensive cleaning of exam room while observing appropriate contact time as indicated for disinfecting solutions.  Subjective:     Patient ID: Elizabeth Mayer , female    DOB: 07-Sep-1960 , 60 y.o.   MRN: [id number]   Chief Complaint  Patient presents with   Follow-up    HPI  Patient is here for Ed follow up. She followed up with cardiology. She had palpation, had A-fibb. Episode    Past Medical History:  Diagnosis Date   Anemia    Angina 1982   related to stress   Asthma    in the past,  no current problems   Complication of anesthesia    states anesthesia made her hair fall out   Constipation    Dyspnea    on oxygen at home - 2L via [place]   Fatigue    GERD (gastroesophageal reflux disease)    patient denies this dx   Headache    Heart murmur 1970s   no problems currently   Ingrown toenail    Lung cancer ([place]) 10/2019   metastatic disease to the brain   On home oxygen therapy    2.2 lpm, 24 hours a day   Past heart attack 1980-1981   pt states she passed out and woke up in hospital- told she had heart attack, but then dr said he couldn't find anything wrong.   Pneumonia    x 1     Family History  Problem Relation Age of Onset   Arthritis Mother    Heart failure Father      Current Outpatient Medications:    acetaminophen (TYLENOL) 500 MG tablet, Take 1,000 mg by mouth in the morning and at bedtime. Taking BID, Disp: , Rfl:    benzoyl peroxide (CERAVE ACNE FOAMING CREAM) 4 % external liquid, Apply 1 application topically 2 (two) times daily. Face, Disp: ,  Rfl:    camphor-menthol (SARNA) lotion, Apply 1 application topically daily., Disp: , Rfl:    Carboxymethylcellulose Sodium (REFRESH CELLUVISC OP), Place 2 drops into both eyes in the morning and at bedtime., Disp: , Rfl:    clindamycin (CLEOCIN T) 1 % lotion, Apply 1 application topically 2 (two) times daily., Disp: , Rfl:    Cyanocobalamin (B-12 COMPLIANCE INJECTION) 1000 MCG/ML KIT, Inject 1,000 mcg as directed See admin instructions. As needed give at Chemo, Disp: , Rfl:    cyclobenzaprine (FLEXERIL) 10 MG tablet, Take 1 tablet (10 mg total) by mouth 3 (three) times daily as needed for muscle spasms., Disp: 30 tablet, Rfl: 0   dexamethasone (DECADRON) 4 MG tablet, Take 8 mg by mouth See admin instructions. As needed when given at chemo, Disp: , Rfl:    diltiazem (CARDIZEM) 30 MG tablet, Take 1 tablet (30 mg total) by mouth 4 (four) times daily. (Patient taking differently: Take 30 mg by mouth as needed.), Disp: 120 tablet, Rfl: 1   fexofenadine (ALLEGRA) 180 MG tablet, Take 180 mg by mouth in the morning., Disp: , Rfl:    fluticasone (FLONASE) 50 MCG/ACT nasal spray, Place 1 spray into both nostrils daily., Disp: , Rfl:  folic acid (FOLVITE) 1 MG tablet, Take 1 tablet by mouth once daily, Disp: 90 tablet, Rfl: 0   guaiFENesin (MUCINEX) 600 MG 12 hr tablet, Take 600 mg by mouth 2 (two) times daily. , Disp: , Rfl:    HYDROcodone-acetaminophen (NORCO/VICODIN) 5-325 MG tablet, Take 1 tablet by mouth every 6 (six) hours as needed for moderate pain., Disp: 30 tablet, Rfl: 0   lidocaine (XYLOCAINE) 2 % solution, Use as directed 15 mLs in the mouth or throat as needed for mouth pain. Swallow 30 min prior to meals and at bedtime for throat/esophagus pain, Disp: 250 mL, Rfl: 1   lidocaine-prilocaine (EMLA) cream, Apply 1 application topically as needed. (Patient taking differently: Apply 1 application topically as needed (port access). Every three weeks), Disp: 30 g, Rfl: 2   Multiple Vitamin  (MULTIVITAMIN WITH MINERALS) TABS tablet, Take 2 tablets by mouth daily. Alive/ gummies, Disp: , Rfl:    ondansetron (ZOFRAN) 8 MG tablet, Take 1 tablet (8 mg total) by mouth every 8 (eight) hours as needed for nausea or vomiting., Disp: 20 tablet, Rfl: 0   OXYGEN, Inhale 3 L/min into the lungs continuous., Disp: , Rfl:    PROAIR HFA 108 (90 Base) MCG/ACT inhaler, Inhale 1-2 puffs into the lungs every 6 (six) hours as needed for wheezing or shortness of breath., Disp: 18 g, Rfl: 3   prochlorperazine (COMPAZINE) 10 MG tablet, Take 1 tablet (10 mg total) by mouth every 6 (six) hours as needed. (Patient taking differently: Take 10 mg by mouth every 6 (six) hours as needed (nausea with chemo).), Disp: 30 tablet, Rfl: 2   Soap & Cleansers (MEDERMA AG FACIAL TONER) LIQD, Apply 1 application topically 2 (two) times daily. Advance dry skin, Disp: , Rfl:    SYMBICORT 80-4.5 MCG/ACT inhaler, INHALE 2 PUFFS BY MOUTH IN THE MORNING AND AT BEDTIME (Patient taking differently: Inhale 2 puffs into the lungs in the morning and at bedtime.), Disp: 11 g, Rfl: 0   triamcinolone ointment (KENALOG) 0.1 %, Apply 1 application topically in the morning and at bedtime., Disp: , Rfl:    amLODipine (NORVASC) 5 MG tablet, Take 1 tablet (5 mg total) by mouth daily., Disp: 30 tablet, Rfl: 2   Allergies  Allergen Reactions   Pseudoephedrine Hypertension   Gabapentin Other (See Comments)    Raise blood pressure and red rings around eyes Blood vessels popped in her eyes   Mobic [Meloxicam] Swelling    Inflamed the area that has inflammation and stabbing pains in the area   Penicillins Hives    Reaction: Childhood   Prednisone     Caused A-fib     Review of Systems  Constitutional:  Negative for chills.  Respiratory:  Negative for cough, choking, shortness of breath and wheezing.   Cardiovascular:  Negative for chest pain and palpitations.  Gastrointestinal:  Negative for constipation and diarrhea.  Endocrine: Negative  for polydipsia and polyphagia.  Neurological:  Negative for dizziness, weakness, numbness and headaches.    Today's Vitals   04/02/21 1629  BP: 134/76  Pulse: 74  Temp: 97.8 F (36.6 C)  TempSrc: Oral  Weight: 149 lb (67.6 kg)  Height: $Remove'5\' 5"'lvjOnqK$  (1.651 m)   Body mass index is 24.79 kg/m.  Wt Readings from Last 3 Encounters:  04/02/21 149 lb (67.6 kg)  04/02/21 148 lb 3.2 oz (67.2 kg)  03/25/21 148 lb (67.1 kg)    Objective:  Physical Exam Constitutional:      Appearance: Normal appearance.  HENT:     Head: Normocephalic and atraumatic.  Cardiovascular:     Rate and Rhythm: Normal rate and regular rhythm.     Pulses: Normal pulses.     Heart sounds: Normal heart sounds. No murmur heard. Pulmonary:     Effort: Pulmonary effort is normal. No respiratory distress.     Breath sounds: Normal breath sounds. No wheezing.  Skin:    General: Skin is warm and dry.     Capillary Refill: Capillary refill takes less than 2 seconds.  Neurological:     Mental Status: She is alert and oriented to person, place, and time.        Assessment And Plan:     1. Essential hypertension - amLODipine (NORVASC) 5 MG tablet; Take 1 tablet (5 mg total) by mouth daily.  Dispense: 30 tablet; Refill: 2 -Limit the intake of processed foods and salt intake. You should increase your intake of green vegetables and fruits. Limit the use of alcohol. Limit fast foods and fried foods. Avoid high fatty saturated and trans fat foods. Keep yourself hydrated with drinking water. Avoid red meats. Eat lean meats instead. Exercise for atleast 30-45 min for atleast 4-5 times a week.  -Instructed patient to watch her BP at home. She will send [place] a log. Pt. Verbalized understanding.  -Follow up with her cardiologist.   2. Atrial fibrillation, unspecified type ([place])  -She recently was seen by cardiologist this morning who placed her on diltiazem 30 mg as needed for palpitation/A-fib. This is to be taken as needed as per  her cardiologist.   The patient was encouraged to call or send a message through [facility] for any questions or concerns.   Follow up: if symptoms persist or do not get better.   Side effects and appropriate use of all the medication(s) were discussed with the patient today. Patient advised to use the medication(s) as directed by their healthcare provider. The patient was encouraged to read, review, and understand all associated package inserts and contact our office with any questions or concerns. The patient accepts the risks of the treatment plan and had an opportunity to ask questions.   Patient was given opportunity to ask questions. Patient verbalized understanding of the plan and was able to repeat key elements of the plan. All questions were answered to their satisfaction.  Raman Kinsey Cowsert, DNP   I, Raman Muhammed Teutsch have reviewed all documentation for this visit. The documentation on 08/15/20 for the exam, diagnosis, procedures, and orders are all accurate and complete.   IF YOU HAVE BEEN REFERRED TO A SPECIALIST, IT MAY TAKE 1-2 WEEKS TO SCHEDULE/PROCESS THE REFERRAL. IF YOU HAVE NOT HEARD FROM US/SPECIALIST IN TWO WEEKS, PLEASE GIVE [place] A CALL AT [phone].   THE PATIENT IS ENCOURAGED TO PRACTICE SOCIAL DISTANCING DUE TO THE COVID-19 PANDEMIC.

## 2021-04-02 NOTE — Progress Notes (Signed)
Primary Care Physician: Minette Brine, FNP Referring Physician: ED f/u    Elizabeth Mayer is a 60 y.o. female with a h/o  non-small cell lung cancer, chronic hypoxic respiratory failure on oxygen who presented to ED with palpitations.  Patient reported that she woke up this morning around 3 AM with palpitations.  She was scheduled to have a CT scan later this morning and has been taking prednisone for pretreatment. She took 1 at 9 PM and woke up this morning at 3 AM to take her prednisone.  At that time she had onset of heart racing.  No chest pain or shortness of breath. EKG showed probable typical atrail flutter at 138 bpm.   She has a history of atrial fibrillation remotely but is not currently undergoing any treatment and does not need blood thinners. She did convert in the ER with Cardizem and with a CHA2DS2VASc of 1, anticoagulation was not started. In the afib clinic today, she is in [name] and has not noted any further afib.   Today, she denies symptoms of palpitations, chest pain, shortness of breath, orthopnea, PND, lower extremity edema, dizziness, presyncope, syncope, or neurologic sequela. The patient is tolerating medications without difficulties and is otherwise without complaint today.   Past Medical History:  Diagnosis Date   Anemia    Angina 1982   related to stress   Asthma    in the past,  no current problems   Complication of anesthesia    states anesthesia made her hair fall out   Constipation    Dyspnea    on oxygen at home - 2L via [place]   Fatigue    GERD (gastroesophageal reflux disease)    patient denies this dx   Headache    Heart murmur 1970s   no problems currently   Ingrown toenail    Lung cancer ([place]) 10/2019   metastatic disease to the brain   On home oxygen therapy    2.2 lpm, 24 hours a day   Past heart attack 1980-1981   pt states she passed out and woke up in hospital- told she had heart attack, but then dr said he couldn't find anything wrong.    Pneumonia    x 1   Past Surgical History:  Procedure Laterality Date   CERVICAL DISC SURGERY  2000   Disc removed from neck    ingrown toe nail surgery Bilateral    IR IMAGING GUIDED PORT INSERTION  02/16/2020   MULTIPLE TOOTH EXTRACTIONS     for braces   RADIOLOGY WITH ANESTHESIA N/A 12/05/2019   Procedure: MRI BRAIN WITH AND WITHOUT CONTRAST;  Surgeon: Radiologist, Medication, MD;  Location: [facility];  Service: Radiology;  Laterality: N/A;   RADIOLOGY WITH ANESTHESIA N/A 01/02/2020   Procedure: MRI BRAIN WITH AND WITHOUT CONTRAST;  Surgeon: Radiologist, Medication, MD;  Location: [facility];  Service: Radiology;  Laterality: N/A;   RADIOLOGY WITH ANESTHESIA N/A 02/20/2020   Procedure: MRI WITH ANESTHESIA OF BRAIN WITH AND WITHOUT CONTRAST;  Surgeon: Radiologist, Medication, MD;  Location: [facility];  Service: Radiology;  Laterality: N/A;   RADIOLOGY WITH ANESTHESIA N/A 06/11/2020   Procedure: MRI WITH ANESTHESIA OF BRAIN WITH AND WITHOUT CONTRAST;  Surgeon: Radiologist, Medication, MD;  Location: [facility];  Service: Radiology;  Laterality: N/A;   RADIOLOGY WITH ANESTHESIA N/A 10/10/2020   Procedure: MRI WITH ANESTHESIA BRAIN WITH AND WITHOUT CONTRAST;  Surgeon: Radiologist, Medication, MD;  Location: [facility];  Service: Radiology;  Laterality: N/A;  RADIOLOGY WITH ANESTHESIA N/A 01/30/2021   Procedure: MRI BRAIN WITH AND WITHOUT CONTRASTWITH ANESTHESIA;  Surgeon: Radiologist, Medication, MD;  Location: [facility];  Service: Radiology;  Laterality: N/A;   TONSILLECTOMY      Current Outpatient Medications  Medication Sig Dispense Refill   acetaminophen (TYLENOL) 500 MG tablet Take 1,000 mg by mouth in the morning and at bedtime. Taking BID     benzoyl peroxide (CERAVE ACNE FOAMING CREAM) 4 % external liquid Apply 1 application topically 2 (two) times daily. Face     camphor-menthol (SARNA) lotion Apply 1 application topically daily.     Carboxymethylcellulose Sodium (REFRESH CELLUVISC OP) Place 2 drops into both  eyes in the morning and at bedtime.     clindamycin (CLEOCIN T) 1 % lotion Apply 1 application topically 2 (two) times daily.     Cyanocobalamin (B-12 COMPLIANCE INJECTION) 1000 MCG/ML KIT Inject 1,000 mcg as directed See admin instructions. As needed give at Chemo     cyclobenzaprine (FLEXERIL) 10 MG tablet Take 1 tablet (10 mg total) by mouth 3 (three) times daily as needed for muscle spasms. 30 tablet 0   dexamethasone (DECADRON) 4 MG tablet Take 8 mg by mouth See admin instructions. As needed when given at chemo     diltiazem (CARDIZEM) 30 MG tablet Take 1 tablet (30 mg total) by mouth 4 (four) times daily. 120 tablet 1   fexofenadine (ALLEGRA) 180 MG tablet Take 180 mg by mouth in the morning.     fluticasone (FLONASE) 50 MCG/ACT nasal spray Place 1 spray into both nostrils daily.     folic acid (FOLVITE) 1 MG tablet Take 1 tablet by mouth once daily 90 tablet 0   guaiFENesin (MUCINEX) 600 MG 12 hr tablet Take 600 mg by mouth 2 (two) times daily.      HYDROcodone-acetaminophen (NORCO/VICODIN) 5-325 MG tablet Take 1 tablet by mouth every 6 (six) hours as needed for moderate pain. 30 tablet 0   lidocaine (XYLOCAINE) 2 % solution Use as directed 15 mLs in the mouth or throat as needed for mouth pain. Swallow 30 min prior to meals and at bedtime for throat/esophagus pain 250 mL 1   lidocaine-prilocaine (EMLA) cream Apply 1 application topically as needed. (Patient taking differently: Apply 1 application topically as needed (port access). Every three weeks) 30 g 2   Multiple Vitamin (MULTIVITAMIN WITH MINERALS) TABS tablet Take 2 tablets by mouth daily. Alive/ gummies     ondansetron (ZOFRAN) 8 MG tablet Take 1 tablet (8 mg total) by mouth every 8 (eight) hours as needed for nausea or vomiting. 20 tablet 0   OXYGEN Inhale 3 L/min into the lungs continuous.     PROAIR HFA 108 (90 Base) MCG/ACT inhaler Inhale 1-2 puffs into the lungs every 6 (six) hours as needed for wheezing or shortness of breath. 18  g 3   prochlorperazine (COMPAZINE) 10 MG tablet Take 1 tablet (10 mg total) by mouth every 6 (six) hours as needed. (Patient taking differently: Take 10 mg by mouth every 6 (six) hours as needed (nausea with chemo).) 30 tablet 2   Soap & Cleansers (MEDERMA AG FACIAL TONER) LIQD Apply 1 application topically 2 (two) times daily. Advance dry skin     SYMBICORT 80-4.5 MCG/ACT inhaler INHALE 2 PUFFS BY MOUTH IN THE MORNING AND AT BEDTIME (Patient taking differently: Inhale 2 puffs into the lungs in the morning and at bedtime.) 11 g 0   triamcinolone ointment (KENALOG) 0.1 % Apply 1 application  topically in the morning and at bedtime.     potassium chloride 20 MEQ/15ML (10%) SOLN Take 15 mLs (20 mEq total) by mouth 2 (two) times daily. (Patient not taking: Reported on 04/02/2021) 210 mL 0   predniSONE (DELTASONE) 50 MG tablet Take 1 tablet orally 13 hours, 7 hours and 1 hour before CT scan on 03/18/21 (Patient not taking: Reported on 04/02/2021) 3 tablet 0   No current facility-administered medications for this encounter.    Allergies  Allergen Reactions   Pseudoephedrine Hypertension   Gabapentin Other (See Comments)    Raise blood pressure and red rings around eyes Blood vessels popped in her eyes   Mobic [Meloxicam] Swelling    Inflamed the area that has inflammation and stabbing pains in the area   Penicillins Hives    Reaction: Childhood   Prednisone     Caused A-fib    Social History   Socioeconomic History   Marital status: Legally Separated    Spouse name: Not on file   Number of children: 0   Years of education: Not on file   Highest education level: Not on file  Occupational History   Occupation: Secondary school teacher  Tobacco Use   Smoking status: Former    Packs/day: 1.00    Years: 20.00    Pack years: 20.00    Types: Cigarettes    Start date: 15    Quit date: 2001    Years since quitting: 22.0   Smokeless tobacco: Never  Vaping Use   Vaping Use: Never used   Substance and Sexual Activity   Alcohol use: No   Drug use: No   Sexual activity: Yes    Birth control/protection: None, Post-menopausal  Other Topics Concern   Not on file  Social History Narrative   Not on file   Social Determinants of Health   Financial Resource Strain: Not on file  Food Insecurity: Not on file  Transportation Needs: Not on file  Physical Activity: Not on file  Stress: Not on file  Social Connections: Not on file  Intimate Partner Violence: Not on file    Family History  Problem Relation Age of Onset   Arthritis Mother    Heart failure Father     ROS- All systems are reviewed and negative except as per the HPI above  Physical Exam: Vitals:   04/02/21 1111  BP: (!) 168/80  Pulse: 77  Weight: 67.2 kg  Height: 5' 5"  (1.651 m)   Wt Readings from Last 3 Encounters:  04/02/21 67.2 kg  03/25/21 67.1 kg  03/21/21 67.3 kg    Labs: Lab Results  Component Value Date   NA 138 03/25/2021   K 4.6 03/25/2021   CL 105 03/25/2021   CO2 23 03/25/2021   GLUCOSE 189 (H) 03/25/2021   BUN 14 03/25/2021   CREATININE 1.08 (H) 03/25/2021   CALCIUM 9.3 03/25/2021   MG 1.8 03/25/2021   Lab Results  Component Value Date   INR 0.9 02/16/2020   No results found for: CHOL, HDL, LDLCALC, TRIG   GEN- The patient is well appearing, alert and oriented x 3 today.   Head- normocephalic, atraumatic Eyes-  Sclera clear, conjunctiva pink Ears- hearing intact Oropharynx- clear Neck- supple, no JVP Lymph- no cervical lymphadenopathy Lungs- Clear to ausculation bilaterally, normal work of breathing Heart- Regular rate and rhythm, no murmurs, rubs or gallops, PMI not laterally displaced GI- soft, NT, ND, + BS Extremities- no clubbing, cyanosis, or edema MS- no  significant deformity or atrophy Skin- no rash or lesion Psych- euthymic mood, full affect Neuro- strength and sensation are intact  EKG-NSR at 77 bpm pr int 136 bpm, qrs int 60 ms, qtc 450 ms  ER ekg  shows probable typical  atrial flutter at  138 bpm   Assessment and Plan:  1. Atrail flutter Possibly induced by prednisone dose Now back in SR She has 30 mg Cardizem to use, directions how to use discussed if needed for palpitations   2. CHA2DS2VASc  score of 1 No need for anticoagulation per guidelines   F/u with afib clinic as needed   Butch Penny C. Dayon Witt, [facility] [address] [place], [place] [address] [phone]

## 2021-04-04 ENCOUNTER — Other Ambulatory Visit: Payer: Self-pay | Admitting: Radiation Therapy

## 2021-04-04 DIAGNOSIS — C7931 Secondary malignant neoplasm of brain: Secondary | ICD-10-CM

## 2021-04-09 ENCOUNTER — Other Ambulatory Visit: Payer: Self-pay | Admitting: Emergency Medicine

## 2021-04-10 ENCOUNTER — Other Ambulatory Visit: Payer: Self-pay | Admitting: Physician Assistant

## 2021-04-10 DIAGNOSIS — C3491 Malignant neoplasm of unspecified part of right bronchus or lung: Secondary | ICD-10-CM

## 2021-04-11 ENCOUNTER — Other Ambulatory Visit: Payer: Self-pay

## 2021-04-11 ENCOUNTER — Inpatient Hospital Stay: Payer: Medicaid Other | Attending: Internal Medicine

## 2021-04-11 ENCOUNTER — Ambulatory Visit: Payer: Medicaid Other | Admitting: Hematology and Oncology

## 2021-04-11 ENCOUNTER — Inpatient Hospital Stay (HOSPITAL_BASED_OUTPATIENT_CLINIC_OR_DEPARTMENT_OTHER): Payer: Medicaid Other | Admitting: Physician Assistant

## 2021-04-11 ENCOUNTER — Other Ambulatory Visit: Payer: Self-pay | Admitting: *Deleted

## 2021-04-11 ENCOUNTER — Inpatient Hospital Stay: Payer: Medicaid Other

## 2021-04-11 ENCOUNTER — Other Ambulatory Visit: Payer: Self-pay | Admitting: Hematology

## 2021-04-11 VITALS — BP 155/74 | HR 87 | Temp 97.5°F | Resp 17 | Wt 150.1 lb

## 2021-04-11 DIAGNOSIS — M546 Pain in thoracic spine: Secondary | ICD-10-CM | POA: Diagnosis not present

## 2021-04-11 DIAGNOSIS — C3491 Malignant neoplasm of unspecified part of right bronchus or lung: Secondary | ICD-10-CM | POA: Diagnosis not present

## 2021-04-11 DIAGNOSIS — Z5111 Encounter for antineoplastic chemotherapy: Secondary | ICD-10-CM | POA: Insufficient documentation

## 2021-04-11 DIAGNOSIS — C7931 Secondary malignant neoplasm of brain: Secondary | ICD-10-CM

## 2021-04-11 DIAGNOSIS — E876 Hypokalemia: Secondary | ICD-10-CM

## 2021-04-11 DIAGNOSIS — Z79899 Other long term (current) drug therapy: Secondary | ICD-10-CM | POA: Insufficient documentation

## 2021-04-11 DIAGNOSIS — R5383 Other fatigue: Secondary | ICD-10-CM | POA: Diagnosis not present

## 2021-04-11 DIAGNOSIS — Z5112 Encounter for antineoplastic immunotherapy: Secondary | ICD-10-CM | POA: Diagnosis present

## 2021-04-11 DIAGNOSIS — R42 Dizziness and giddiness: Secondary | ICD-10-CM | POA: Diagnosis not present

## 2021-04-11 DIAGNOSIS — T451X5A Adverse effect of antineoplastic and immunosuppressive drugs, initial encounter: Secondary | ICD-10-CM | POA: Diagnosis not present

## 2021-04-11 DIAGNOSIS — Z95828 Presence of other vascular implants and grafts: Secondary | ICD-10-CM

## 2021-04-11 DIAGNOSIS — D6481 Anemia due to antineoplastic chemotherapy: Secondary | ICD-10-CM | POA: Diagnosis not present

## 2021-04-11 DIAGNOSIS — Z87891 Personal history of nicotine dependence: Secondary | ICD-10-CM | POA: Insufficient documentation

## 2021-04-11 LAB — CBC WITH DIFFERENTIAL (CANCER CENTER ONLY)
Abs Immature Granulocytes: 0.03 10*3/uL (ref 0.00–0.07)
Basophils Absolute: 0.1 10*3/uL (ref 0.0–0.1)
Basophils Relative: 1 %
Eosinophils Absolute: 0.1 10*3/uL (ref 0.0–0.5)
Eosinophils Relative: 2 %
HCT: 31.9 % — ABNORMAL LOW (ref 36.0–46.0)
Hemoglobin: 10.2 g/dL — ABNORMAL LOW (ref 12.0–15.0)
Immature Granulocytes: 0 %
Lymphocytes Relative: 14 %
Lymphs Abs: 1 10*3/uL (ref 0.7–4.0)
MCH: 31.6 pg (ref 26.0–34.0)
MCHC: 32 g/dL (ref 30.0–36.0)
MCV: 98.8 fL (ref 80.0–100.0)
Monocytes Absolute: 0.7 10*3/uL (ref 0.1–1.0)
Monocytes Relative: 11 %
Neutro Abs: 4.8 10*3/uL (ref 1.7–7.7)
Neutrophils Relative %: 72 %
Platelet Count: 195 10*3/uL (ref 150–400)
RBC: 3.23 MIL/uL — ABNORMAL LOW (ref 3.87–5.11)
RDW: 14.3 % (ref 11.5–15.5)
WBC Count: 6.7 10*3/uL (ref 4.0–10.5)
nRBC: 0 % (ref 0.0–0.2)

## 2021-04-11 LAB — CMP (CANCER CENTER ONLY)
ALT: 12 U/L (ref 0–44)
AST: 22 U/L (ref 15–41)
Albumin: 3.9 g/dL (ref 3.5–5.0)
Alkaline Phosphatase: 50 U/L (ref 38–126)
Anion gap: 7 (ref 5–15)
BUN: 17 mg/dL (ref 8–23)
CO2: 30 mmol/L (ref 22–32)
Calcium: 9.5 mg/dL (ref 8.9–10.3)
Chloride: 105 mmol/L (ref 98–111)
Creatinine: 1.13 mg/dL — ABNORMAL HIGH (ref 0.44–1.00)
GFR, Estimated: 55 mL/min — ABNORMAL LOW (ref >60)
Glucose, Bld: 77 mg/dL (ref 70–99)
Potassium: 3.3 mmol/L — ABNORMAL LOW (ref 3.5–5.1)
Sodium: 142 mmol/L (ref 135–145)
Total Bilirubin: 0.3 mg/dL (ref 0.3–1.2)
Total Protein: 7.1 g/dL (ref 6.5–8.1)

## 2021-04-11 LAB — MAGNESIUM: Magnesium: 1.8 mg/dL (ref 1.7–2.4)

## 2021-04-11 LAB — TSH: TSH: 1.538 u[IU]/mL (ref 0.308–3.960)

## 2021-04-11 MED ORDER — CYANOCOBALAMIN 1000 MCG/ML IJ SOLN
1000.0000 ug | Freq: Once | INTRAMUSCULAR | Status: AC
  Administered 2021-04-11: 1000 ug via INTRAMUSCULAR
  Filled 2021-04-11: qty 1

## 2021-04-11 MED ORDER — SODIUM CHLORIDE 0.9% FLUSH
10.0000 mL | INTRAVENOUS | Status: DC | PRN
  Administered 2021-04-11: 10 mL

## 2021-04-11 MED ORDER — SODIUM CHLORIDE 0.9 % IV SOLN: Freq: Once | INTRAVENOUS | Status: AC

## 2021-04-11 MED ORDER — SODIUM CHLORIDE 0.9 % IV SOLN
200.0000 mg | Freq: Once | INTRAVENOUS | Status: AC
  Administered 2021-04-11: 200 mg via INTRAVENOUS
  Filled 2021-04-11: qty 8

## 2021-04-11 MED ORDER — HEPARIN SOD (PORK) LOCK FLUSH 100 UNIT/ML IV SOLN
500.0000 [IU] | Freq: Once | INTRAVENOUS | Status: AC | PRN
  Administered 2021-04-11: 500 [IU]

## 2021-04-11 MED ORDER — POTASSIUM CHLORIDE CRYS ER 20 MEQ PO TBCR: 20.0000 meq | EXTENDED_RELEASE_TABLET | Freq: Once | ORAL | Status: DC

## 2021-04-11 MED ORDER — SODIUM CHLORIDE 0.9 % IV SOLN
500.0000 mg/m2 | Freq: Once | INTRAVENOUS | Status: AC
  Administered 2021-04-11: 900 mg via INTRAVENOUS
  Filled 2021-04-11: qty 20

## 2021-04-11 MED ORDER — POTASSIUM CHLORIDE CRYS ER 20 MEQ PO TBCR
20.0000 meq | EXTENDED_RELEASE_TABLET | Freq: Once | ORAL | Status: AC
  Administered 2021-04-11: 20 meq via ORAL
  Filled 2021-04-11: qty 1

## 2021-04-11 MED ORDER — SODIUM CHLORIDE 0.9% FLUSH
10.0000 mL | Freq: Once | INTRAVENOUS | Status: AC
  Administered 2021-04-11: 10 mL

## 2021-04-11 MED ORDER — ALTEPLASE 2 MG IJ SOLR
2.0000 mg | Freq: Once | INTRAMUSCULAR | Status: AC
  Administered 2021-04-11: 2 mg
  Filled 2021-04-11: qty 2

## 2021-04-11 MED ORDER — BLOOD PRESSURE MONITOR/M CUFF MISC: 0 refills | Status: DC

## 2021-04-11 MED ORDER — PROCHLORPERAZINE MALEATE 10 MG PO TABS
10.0000 mg | ORAL_TABLET | Freq: Once | ORAL | Status: AC
  Administered 2021-04-11: 10 mg via ORAL
  Filled 2021-04-11: qty 1

## 2021-04-11 NOTE — Progress Notes (Signed)
Patient [place] had no blood return sent to lab for blood draw after administering cath flow

## 2021-04-11 NOTE — Patient Instructions (Signed)
[facility]  Discharge Instructions: Thank you for choosing [facility] to provide your oncology and hematology care.   If you have a lab appointment with the [facility], please go directly to the [facility] and check in at the registration area.   Wear comfortable clothing and clothing appropriate for easy access to any Portacath or PICC line.   We strive to give you quality time with your provider. You may need to reschedule your appointment if you arrive late (15 or more minutes).  Arriving late affects you and other patients whose appointments are after yours.  Also, if you miss three or more appointments without notifying the office, you may be dismissed from the clinic at the providers discretion.      For prescription refill requests, have your pharmacy contact our office and allow 72 hours for refills to be completed.    Today you received the following chemotherapy and/or immunotherapy agents Keytruda, Alimta      To help prevent nausea and vomiting after your treatment, we encourage you to take your nausea medication as directed.  BELOW ARE SYMPTOMS THAT SHOULD BE REPORTED IMMEDIATELY: *FEVER GREATER THAN 100.4 F (38 C) OR HIGHER *CHILLS OR SWEATING *NAUSEA AND VOMITING THAT IS NOT CONTROLLED WITH YOUR NAUSEA MEDICATION *UNUSUAL SHORTNESS OF BREATH *UNUSUAL BRUISING OR BLEEDING *URINARY PROBLEMS (pain or burning when urinating, or frequent urination) *BOWEL PROBLEMS (unusual diarrhea, constipation, pain near the anus) TENDERNESS IN MOUTH AND THROAT WITH OR WITHOUT PRESENCE OF ULCERS (sore throat, sores in mouth, or a toothache) UNUSUAL RASH, SWELLING OR PAIN  UNUSUAL VAGINAL DISCHARGE OR ITCHING   Items with * indicate a potential emergency and should be followed up as soon as possible or go to the Emergency Department if any problems should occur.  Please show the CHEMOTHERAPY ALERT CARD or IMMUNOTHERAPY ALERT CARD at  check-in to the Emergency Department and triage nurse.  Should you have questions after your visit or need to cancel or reschedule your appointment, please contact [facility]  Dept: [phone]  and follow the prompts.  Office hours are 8:00 a.m. to 4:30 p.m. Monday - Friday. Please note that voicemails left after 4:00 p.m. may not be returned until the following business day.  We are closed weekends and major holidays. You have access to a nurse at all times for urgent questions. Please call the main number to the clinic Dept: [phone] and follow the prompts.   For any non-urgent questions, you may also contact your provider using MyChart. We now offer e-Visits for anyone 62 and older to request care online for non-urgent symptoms. For details visit [email or web address].   Also download the MyChart app! Go to the app store, search "MyChart", open the app, select [facility], and log in with your MyChart username and password.  Due to Covid, a mask is required upon entering the hospital/clinic. If you do not have a mask, one will be given to you upon arrival. For doctor visits, patients may have 1 support person aged 18 or older with them. For treatment visits, patients cannot have anyone with them due to current Covid guidelines and our immunocompromised population.

## 2021-04-14 ENCOUNTER — Ambulatory Visit (HOSPITAL_BASED_OUTPATIENT_CLINIC_OR_DEPARTMENT_OTHER)
Admission: RE | Admit: 2021-04-14 | Discharge: 2021-04-14 | Disposition: A | Discharge status: Home / Self Care | Payer: Medicaid Other | Source: Ambulatory Visit | Attending: Hematology and Oncology | Admitting: Hematology and Oncology

## 2021-04-14 ENCOUNTER — Encounter: Payer: Self-pay | Admitting: Physician Assistant

## 2021-04-14 ENCOUNTER — Encounter: Payer: Self-pay | Admitting: Internal Medicine

## 2021-04-14 ENCOUNTER — Other Ambulatory Visit: Payer: Self-pay

## 2021-04-14 DIAGNOSIS — C3491 Malignant neoplasm of unspecified part of right bronchus or lung: Secondary | ICD-10-CM | POA: Insufficient documentation

## 2021-04-14 MED ORDER — IOHEXOL 300 MG/ML  SOLN
80.0000 mL | Freq: Once | INTRAMUSCULAR | Status: AC | PRN
  Administered 2021-04-14: 80 mL via INTRAVENOUS

## 2021-04-14 MED ORDER — HEPARIN SOD (PORK) LOCK FLUSH 100 UNIT/ML IV SOLN
500.0000 [IU] | Freq: Once | INTRAVENOUS | Status: AC
  Administered 2021-04-14: 500 [IU] via INTRAVENOUS

## 2021-04-14 NOTE — Progress Notes (Signed)
[place] [phone]) [phone]   [phone]  PROGRESS NOTE  Patient Care Team: Minette Brine, FNP as PCP - General (General Practice)  Hematological/Oncological History # Metastatic Adenocarcinoma of the Lung #Brain Metastasis 1) 11/08/2019: establish care with Dr. Julien Nordmann and his PA Cassie Heilingoetter. Noted to have widely metastatic lung cancer with involvement of the brain, lymph nodes, and right lung.  2) 12/04/2019: palliative radiation to the right lung mass/cervical adenopathy 3) 9/7/02021: started therapy with Carbo/Pem/Pem 4) 9/29-10/09/2019: patient underwent SRS to the brain mets 5) 03/22/2020: repeat CT C/A/P and neck scheduled. Transfer care to Dr. Lorenso Courier.   6) 03/22/2020: CT C/A/P showed interval significant improvement in previously demonstrated extensive confluent lymphadenopathy in the superior mediastinum and right hilar regions. 7) 08/15/2020: Holding chemotherapy due to poor Hgb and fatigue. Allowing patient a brief 'chemo holiday' to rebound appropriately from last cycle.  8) 08/29/2020: restarted chemotherapy, Cycle 12 Day 1 9) 09/19/2020: patient requested to HOLD Cycle 13 in setting of fatigue/weakness.  10) 11/29/2020: Cycle 14 Day 1 of chemotherapy, delayed start due to insurance issues.   Interval History:  Elizabeth Mayer 61 y.o. female with medical history significant for metastatic adenocarcinoma of the lung who presents for follow up. The patient's last visit was on 03/21/2021 when she received Cycle 19, Day 1 of pem/pem maintenance.  In the interim since her last visit she has had no other changed in her health.   On exam today Elizabeth Mayer reports that her energy and appetite are stable. She continues to complete her daily activities on her own.  She denies nausea, vomiting or abdominal pain.  She continues to have some constipation with a bowel movement every other day.  She takes stool softeners with improvement of constipation.   Denies easy bruising or signs of bleeding.  Patient reports improvement of heart palpitations but would like to get a blood pressure cuff to measure her blood pressure and heart rate.  She continues to use 3 L of supplemental oxygen.  She has intermittent episodes of dizziness but denies any syncopal episodes.  She continues to have memory changes that has been present for the last several weeks.  She has difficulty forgetting tasks during the day and difficulty with word recognition.  Patient continues to have floaters in the lower field of her left eye that comes and goes.  She denies any fevers, chills, night sweats, chest pain or cough. She has no other complaints.  A full 10 point ROS is listed below.  MEDICAL HISTORY:  Past Medical History:  Diagnosis Date   Anemia    Angina 1982   related to stress   Asthma    in the past,  no current problems   Complication of anesthesia    states anesthesia made her hair fall out   Constipation    Dyspnea    on oxygen at home - 2L via [place]   Fatigue    GERD (gastroesophageal reflux disease)    patient denies this dx   Headache    Heart murmur 1970s   no problems currently   Ingrown toenail    Lung cancer ([place]) 10/2019   metastatic disease to the brain   On home oxygen therapy    2.2 lpm, 24 hours a day   Past heart attack 1980-1981   pt states she passed out and woke up in hospital- told she had heart attack, but then dr said he couldn't find anything wrong.   Pneumonia  x 1    SURGICAL HISTORY: Past Surgical History:  Procedure Laterality Date   CERVICAL DISC SURGERY  2000   Disc removed from neck    ingrown toe nail surgery Bilateral    IR IMAGING GUIDED PORT INSERTION  02/16/2020   MULTIPLE TOOTH EXTRACTIONS     for braces   RADIOLOGY WITH ANESTHESIA N/A 12/05/2019   Procedure: MRI BRAIN WITH AND WITHOUT CONTRAST;  Surgeon: Radiologist, Medication, MD;  Location: [facility];  Service: Radiology;  Laterality: N/A;   RADIOLOGY WITH  ANESTHESIA N/A 01/02/2020   Procedure: MRI BRAIN WITH AND WITHOUT CONTRAST;  Surgeon: Radiologist, Medication, MD;  Location: [facility];  Service: Radiology;  Laterality: N/A;   RADIOLOGY WITH ANESTHESIA N/A 02/20/2020   Procedure: MRI WITH ANESTHESIA OF BRAIN WITH AND WITHOUT CONTRAST;  Surgeon: Radiologist, Medication, MD;  Location: [facility];  Service: Radiology;  Laterality: N/A;   RADIOLOGY WITH ANESTHESIA N/A 06/11/2020   Procedure: MRI WITH ANESTHESIA OF BRAIN WITH AND WITHOUT CONTRAST;  Surgeon: Radiologist, Medication, MD;  Location: [facility];  Service: Radiology;  Laterality: N/A;   RADIOLOGY WITH ANESTHESIA N/A 10/10/2020   Procedure: MRI WITH ANESTHESIA BRAIN WITH AND WITHOUT CONTRAST;  Surgeon: Radiologist, Medication, MD;  Location: [facility];  Service: Radiology;  Laterality: N/A;   RADIOLOGY WITH ANESTHESIA N/A 01/30/2021   Procedure: MRI BRAIN WITH AND WITHOUT CONTRASTWITH ANESTHESIA;  Surgeon: Radiologist, Medication, MD;  Location: [facility];  Service: Radiology;  Laterality: N/A;   TONSILLECTOMY      SOCIAL HISTORY: Social History   Socioeconomic History   Marital status: Legally Separated    Spouse name: Not on file   Number of children: 0   Years of education: Not on file   Highest education level: Not on file  Occupational History   Occupation: Secondary school teacher  Tobacco Use   Smoking status: Former    Packs/day: 1.00    Years: 20.00    Pack years: 20.00    Types: Cigarettes    Start date: 22    Quit date: 2001    Years since quitting: 22.0   Smokeless tobacco: Never  Vaping Use   Vaping Use: Never used  Substance and Sexual Activity   Alcohol use: No   Drug use: No   Sexual activity: Yes    Birth control/protection: None, Post-menopausal  Other Topics Concern   Not on file  Social History Narrative   Not on file   Social Determinants of Health   Financial Resource Strain: Not on file  Food Insecurity: Not on file  Transportation Needs: Not on file  Physical  Activity: Not on file  Stress: Not on file  Social Connections: Not on file  Intimate Partner Violence: Not on file    FAMILY HISTORY: Family History  Problem Relation Age of Onset   Arthritis Mother    Heart failure Father     ALLERGIES:  is allergic to pseudoephedrine, gabapentin, mobic [meloxicam], penicillins, and prednisone.  MEDICATIONS:  Current Outpatient Medications  Medication Sig Dispense Refill   acetaminophen (TYLENOL) 500 MG tablet Take 1,000 mg by mouth in the morning and at bedtime. Taking BID     amLODipine (NORVASC) 5 MG tablet Take 1 tablet (5 mg total) by mouth daily. 30 tablet 2   benzoyl peroxide (CERAVE ACNE FOAMING CREAM) 4 % external liquid Apply 1 application topically 2 (two) times daily. Face     camphor-menthol (SARNA) lotion Apply 1 application topically daily.     Carboxymethylcellulose Sodium (  REFRESH CELLUVISC OP) Place 2 drops into both eyes in the morning and at bedtime.     clindamycin (CLEOCIN T) 1 % lotion Apply 1 application topically 2 (two) times daily.     cyclobenzaprine (FLEXERIL) 10 MG tablet Take 1 tablet (10 mg total) by mouth 3 (three) times daily as needed for muscle spasms. 30 tablet 0   dexamethasone (DECADRON) 4 MG tablet Take 8 mg by mouth See admin instructions. As needed when given at chemo     diltiazem (CARDIZEM) 30 MG tablet Take 1 tablet (30 mg total) by mouth 4 (four) times daily. (Patient taking differently: Take 30 mg by mouth as needed.) 120 tablet 1   fexofenadine (ALLEGRA) 180 MG tablet Take 180 mg by mouth in the morning.     fluticasone (FLONASE) 50 MCG/ACT nasal spray Place 1 spray into both nostrils daily.     folic acid (FOLVITE) 1 MG tablet Take 1 tablet by mouth once daily 90 tablet 0   guaiFENesin (MUCINEX) 600 MG 12 hr tablet Take 600 mg by mouth 2 (two) times daily.      HYDROcodone-acetaminophen (NORCO/VICODIN) 5-325 MG tablet Take 1 tablet by mouth every 6 (six) hours as needed for moderate pain. 30 tablet 0    lidocaine (XYLOCAINE) 2 % solution Use as directed 15 mLs in the mouth or throat as needed for mouth pain. Swallow 30 min prior to meals and at bedtime for throat/esophagus pain 250 mL 1   lidocaine-prilocaine (EMLA) cream Apply 1 application topically as needed. (Patient taking differently: Apply 1 application topically as needed (port access). Every three weeks) 30 g 2   Multiple Vitamin (MULTIVITAMIN WITH MINERALS) TABS tablet Take 2 tablets by mouth daily. Alive/ gummies     ondansetron (ZOFRAN) 8 MG tablet Take 1 tablet (8 mg total) by mouth every 8 (eight) hours as needed for nausea or vomiting. 20 tablet 0   OXYGEN Inhale 3 L/min into the lungs continuous.     PROAIR HFA 108 (90 Base) MCG/ACT inhaler Inhale 1-2 puffs into the lungs every 6 (six) hours as needed for wheezing or shortness of breath. 18 g 3   prochlorperazine (COMPAZINE) 10 MG tablet Take 1 tablet (10 mg total) by mouth every 6 (six) hours as needed. (Patient taking differently: Take 10 mg by mouth every 6 (six) hours as needed (nausea with chemo).) 30 tablet 2   Soap & Cleansers (MEDERMA AG FACIAL TONER) LIQD Apply 1 application topically 2 (two) times daily. Advance dry skin     SYMBICORT 80-4.5 MCG/ACT inhaler INHALE 2 PUFFS BY MOUTH IN THE MORNING AND AT BEDTIME 11 g 0   triamcinolone ointment (KENALOG) 0.1 % Apply 1 application topically in the morning and at bedtime.     Blood Pressure Monitoring (BLOOD PRESSURE MONITOR/M CUFF) MISC Please dispense 1 medium Blood pressure cuff for home monitoring 1 each 0   No current facility-administered medications for this visit.    REVIEW OF SYSTEMS:   Constitutional: ( - ) fevers, ( - )  chills , ( - ) night sweats Eyes: ( - ) blurriness of vision, ( - ) double vision, ( - ) watery eyes Ears, nose, mouth, throat, and face: ( - ) mucositis, ( - ) sore throat Respiratory: ( - ) cough, ( - ) dyspnea, ( - ) wheezes Cardiovascular: ( + ) palpitation, ( - ) chest discomfort, ( - )  lower extremity swelling Gastrointestinal:  ( - ) nausea, ( - ) heartburn, ( - )  change in bowel habits Skin: ( - ) abnormal skin rashes Lymphatics: ( - ) new lymphadenopathy, ( - ) easy bruising Neurological: ( - ) numbness, ( - ) tingling, ( - ) new weaknesses Behavioral/Psych: ( - ) mood change, ( - ) new changes  All other systems were reviewed with the patient and are negative.  PHYSICAL EXAMINATION: ECOG PERFORMANCE STATUS: 2 - Symptomatic, <50% confined to bed  Vitals:   04/11/21 1116  BP: (!) 155/74  Pulse: 87  Resp: 17  Temp: (!) 97.5 F (36.4 C)  SpO2: 100%     Filed Weights   04/11/21 1116  Weight: 150 lb 2 oz (68.1 kg)     GENERAL: well appearing middle aged [place] American female. alert, no distress and comfortable SKIN: skin color, texture, turgor are normal, no rashes or significant lesions EYES: conjunctiva are pink and non-injected, sclera clear LUNGS: clear to auscultation and percussion with normal breathing effort HEART: regular rate & rhythm and no murmurs and no lower extremity edema Musculoskeletal: no cyanosis of digits and no clubbing.  PSYCH: alert & oriented x 3, fluent speech NEURO: no focal motor/sensory deficits   LABORATORY DATA:  I have reviewed the data as listed CBC Latest Ref Rng & Units 04/11/2021 03/25/2021 03/21/2021  WBC 4.0 - 10.5 K/uL 6.7 5.1 3.9(L)  Hemoglobin 12.0 - 15.0 g/dL 10.2(L) 10.9(L) 10.0(L)  Hematocrit 36.0 - 46.0 % 31.9(L) 34.0(L) 31.0(L)  Platelets 150 - 400 K/uL 195 183 216    CMP Latest Ref Rng & Units 04/11/2021 03/25/2021 03/21/2021  Glucose 70 - 99 mg/dL 77 189(H) 119(H)  BUN 8 - 23 mg/dL 17 14 12   Creatinine 0.44 - 1.00 mg/dL 1.13(H) 1.08(H) 1.12(H)  Sodium 135 - 145 mmol/L 142 138 144  Potassium 3.5 - 5.1 mmol/L 3.3(L) 4.6 3.5  Chloride 98 - 111 mmol/L 105 105 108  CO2 22 - 32 mmol/L 30 23 27   Calcium 8.9 - 10.3 mg/dL 9.5 9.3 8.9  Total Protein 6.5 - 8.1 g/dL 7.1 - 7.0  Total Bilirubin 0.3 - 1.2 mg/dL  0.3 - 0.2(L)  Alkaline Phos 38 - 126 U/L 50 - 50  AST 15 - 41 U/L 22 - 28  ALT 0 - 44 U/L 12 - 16    RADIOGRAPHIC STUDIES: DG Chest Port 1 View  Result Date: 03/25/2021 CLINICAL DATA:  Palpitations. EXAM: PORTABLE CHEST 1 VIEW COMPARISON:  08/28/2020 FINDINGS: 0535 hours. Stable asymmetric elevation right hemidiaphragm. Post treatment changes in the right parahilar lung are stable. Streaky opacity again noted left base, likely atelectatic. No evidence for pulmonary edema or focal airspace consolidation. Cardiopericardial silhouette is at upper limits of normal for size. Telemetry leads overlie the chest. Right Port-A-Cath again noted. IMPRESSION: Stable exam. No acute cardiopulmonary findings. Electronically Signed   By: Misty Stanley M.D.   On: 03/25/2021 05:54     ASSESSMENT & PLAN Elizabeth Mayer 61 y.o. female with medical history significant for metastatic adenocarcinoma of the lung who presents for follow up.    After review the labs, the records, discussion with the patient the findings are most consistent with metastatic adenocarcinoma the lung without any targetable mutations.  The patient has been started on carboplatin pemetrexed pembrolizumab every 3 weeks with Dr. Julien Nordmann, however the patient has expressed a desire to transition to another provider.  As such we were happy to take up her care here.  She already discontinued the carboplatin and is on maintenance pemetrexed/pembrolizumab therapy. We assumed her care  with Cycle 6 on 04/04/2020.  # Metastatic Adenocarcinoma of the Lung #Brain Metastasis  --no targetable mutations noted on prior workup. --continue to follow with Dr. Laqueta Due Onc for metastatic spread to the brain.  --monitor CMP, CBC, and TSH with each treatment --can consider docetaxel/ramucirumab at time of progression.  Plan: --CT scan shows stable disease in Sept 2022. Next one schedule for 03/27/2021.  --today is Cycle 20 Day 1 of maintenance Pem/Pem --Labs  today were reviewed and adequate for treatment today.  --RTC in 3 weeks for Cycle 21 of chemotherapy  #Chemotherapy Induced Anemia --Hgb 10.2 today, stable from prior --continue to monitor   # Fatigue: --Uncertain etiology but likely chemotherapy/immunotherapy side effects  --Monitor closely  # Mid back pain: --no findings of metastasis to spine or structural problems with back on last imaging.    #hypokalemia: --potassium level is 3.3 --gave 20 mEq of oral potassium chloride and encouraged to eat potassium rich foods.   #Memory changes/vision changes/dizziness --Memory loss present for the last several weeks, mainly with word recognition and difficulty remembering daily tasks.  --Floaters in lower visual field of left eye, occasional.  --Dizziness is more frequent but shorter in duration.  --She is due for repeat MRI scan of the brain and follow up with rad onc, awaiting appts.  No orders of the defined types were placed in this encounter.   All questions were answered. The patient knows to call the clinic with any problems, questions or concerns.  I have spent a total of 30 minutes minutes of face-to-face and non-face-to-face time, preparing to see the patient, obtaining and/or reviewing separately obtained history, performing a medically appropriate examination, counseling and educating the patient, ordering tests, documenting clinical information in the electronic health record and care coordination.   Lincoln Brigham Department of Hematology/Oncology [place] [facility] Phone: [phone]    04/14/2021 12:28 PM

## 2021-04-14 NOTE — Progress Notes (Signed)
[facility] [phone]   [phone]  PROGRESS NOTE  Patient Care Team: Minette Brine, FNP as PCP - General (General Practice)  Hematological/Oncological History # Metastatic Adenocarcinoma of the Lung #Brain Metastasis 1) 11/08/2019: establish care with Dr. Julien Nordmann and his PA Cassie Heilingoetter. Noted to have widely metastatic lung cancer with involvement of the brain, lymph nodes, and right lung.  2) 12/04/2019: palliative radiation to the right lung mass/cervical adenopathy 3) 9/7/02021: started therapy with Carbo/Pem/Pem 4) 9/29-10/09/2019: patient underwent SRS to the brain mets 5) 03/22/2020: repeat CT C/A/P and neck scheduled. Transfer care to Dr. Lorenso Courier.   6) 03/22/2020: CT C/A/P showed interval significant improvement in previously demonstrated extensive confluent lymphadenopathy in the superior mediastinum and right hilar regions. 7) 08/15/2020: Holding chemotherapy due to poor Hgb and fatigue. Allowing patient a brief 'chemo holiday' to rebound appropriately from last cycle.  8) 08/29/2020: restarted chemotherapy, Cycle 12 Day 1 9) 09/19/2020: patient requested to HOLD Cycle 13 in setting of fatigue/weakness.  10) 11/29/2020: Cycle 14 Day 1 of chemotherapy, delayed start due to insurance issues.   Interval History:  Elizabeth Mayer 61 y.o. female with medical history significant for metastatic adenocarcinoma of the lung who presents for follow up. The patient's last visit was on 02/28/2021 when she received Cycle 18, Day 1 of pem/pem maintenance.  In the interim since her last visit she has had no other changed in her health.   On exam today Elizabeth Mayer reports that her energy levels are fairly stable.  She continues to complete her daily activities on her own.  Patient has a good appetite without any recent weight changes.  She denies any nausea or vomiting episodes.  Patient reports intermittent episodes of mid abdominal pain without any known  triggers.  Patient reports constipation and has a bowel movement every other day.  She is currently on stool softeners with improvement of symptoms and denies any straining.  Patient denies easy bruising or signs of bleeding.  She reports intermittent episodes of palpitations that was more noticeable when she exerted herself.  She denies any chest pain or changes in her breathing.  She is currently using 3 L of oxygen.  Patient reports occasional episodes of dizziness that she feels is more frequent but does not last as long as prior.  She denies any syncopal episodes.  Patient reports noticing memory loss in the last 2 to 3 weeks.  She has difficulty forgetting tasks during the day and difficulty with word recognition.  Patient continues to have floaters in the lower field of her left eye that comes and goes.  She denies any fevers, chills, night sweats, chest pain or cough. She has no other complaints.  A full 10 point ROS is listed below.  MEDICAL HISTORY:  Past Medical History:  Diagnosis Date   Anemia    Angina 1982   related to stress   Asthma    in the past,  no current problems   Complication of anesthesia    states anesthesia made her hair fall out   Constipation    Dyspnea    on oxygen at home - 2L via [place]   Fatigue    GERD (gastroesophageal reflux disease)    patient denies this dx   Headache    Heart murmur 1970s   no problems currently   Ingrown toenail    Lung cancer ([place]) 10/2019   metastatic disease to the brain   On home oxygen therapy  2.2 lpm, 24 hours a day   Past heart attack 1980-1981   pt states she passed out and woke up in hospital- told she had heart attack, but then dr said he couldn't find anything wrong.   Pneumonia    x 1    SURGICAL HISTORY: Past Surgical History:  Procedure Laterality Date   CERVICAL DISC SURGERY  2000   Disc removed from neck    ingrown toe nail surgery Bilateral    IR IMAGING GUIDED PORT INSERTION  02/16/2020   MULTIPLE TOOTH  EXTRACTIONS     for braces   RADIOLOGY WITH ANESTHESIA N/A 12/05/2019   Procedure: MRI BRAIN WITH AND WITHOUT CONTRAST;  Surgeon: Radiologist, Medication, MD;  Location: [facility];  Service: Radiology;  Laterality: N/A;   RADIOLOGY WITH ANESTHESIA N/A 01/02/2020   Procedure: MRI BRAIN WITH AND WITHOUT CONTRAST;  Surgeon: Radiologist, Medication, MD;  Location: [facility];  Service: Radiology;  Laterality: N/A;   RADIOLOGY WITH ANESTHESIA N/A 02/20/2020   Procedure: MRI WITH ANESTHESIA OF BRAIN WITH AND WITHOUT CONTRAST;  Surgeon: Radiologist, Medication, MD;  Location: [facility];  Service: Radiology;  Laterality: N/A;   RADIOLOGY WITH ANESTHESIA N/A 06/11/2020   Procedure: MRI WITH ANESTHESIA OF BRAIN WITH AND WITHOUT CONTRAST;  Surgeon: Radiologist, Medication, MD;  Location: [facility];  Service: Radiology;  Laterality: N/A;   RADIOLOGY WITH ANESTHESIA N/A 10/10/2020   Procedure: MRI WITH ANESTHESIA BRAIN WITH AND WITHOUT CONTRAST;  Surgeon: Radiologist, Medication, MD;  Location: [facility];  Service: Radiology;  Laterality: N/A;   RADIOLOGY WITH ANESTHESIA N/A 01/30/2021   Procedure: MRI BRAIN WITH AND WITHOUT CONTRASTWITH ANESTHESIA;  Surgeon: Radiologist, Medication, MD;  Location: [facility];  Service: Radiology;  Laterality: N/A;   TONSILLECTOMY      SOCIAL HISTORY: Social History   Socioeconomic History   Marital status: Legally Separated    Spouse name: Not on file   Number of children: 0   Years of education: Not on file   Highest education level: Not on file  Occupational History   Occupation: Secondary school teacher  Tobacco Use   Smoking status: Former    Packs/day: 1.00    Years: 20.00    Pack years: 20.00    Types: Cigarettes    Start date: 60    Quit date: 2001    Years since quitting: 22.0   Smokeless tobacco: Never  Vaping Use   Vaping Use: Never used  Substance and Sexual Activity   Alcohol use: No   Drug use: No   Sexual activity: Yes    Birth control/protection: None, Post-menopausal   Other Topics Concern   Not on file  Social History Narrative   Not on file   Social Determinants of Health   Financial Resource Strain: Not on file  Food Insecurity: Not on file  Transportation Needs: Not on file  Physical Activity: Not on file  Stress: Not on file  Social Connections: Not on file  Intimate Partner Violence: Not on file    FAMILY HISTORY: Family History  Problem Relation Age of Onset   Arthritis Mother    Heart failure Father     ALLERGIES:  is allergic to pseudoephedrine, gabapentin, mobic [meloxicam], penicillins, and prednisone.  MEDICATIONS:  Current Outpatient Medications  Medication Sig Dispense Refill   acetaminophen (TYLENOL) 500 MG tablet Take 1,000 mg by mouth in the morning and at bedtime. Taking BID     amLODipine (NORVASC) 5 MG tablet Take 1 tablet (5 mg total) by  mouth daily. 30 tablet 2   benzoyl peroxide (CERAVE ACNE FOAMING CREAM) 4 % external liquid Apply 1 application topically 2 (two) times daily. Face     camphor-menthol (SARNA) lotion Apply 1 application topically daily.     Carboxymethylcellulose Sodium (REFRESH CELLUVISC OP) Place 2 drops into both eyes in the morning and at bedtime.     clindamycin (CLEOCIN T) 1 % lotion Apply 1 application topically 2 (two) times daily.     cyclobenzaprine (FLEXERIL) 10 MG tablet Take 1 tablet (10 mg total) by mouth 3 (three) times daily as needed for muscle spasms. 30 tablet 0   dexamethasone (DECADRON) 4 MG tablet Take 8 mg by mouth See admin instructions. As needed when given at chemo     diltiazem (CARDIZEM) 30 MG tablet Take 1 tablet (30 mg total) by mouth 4 (four) times daily. (Patient taking differently: Take 30 mg by mouth as needed.) 120 tablet 1   fexofenadine (ALLEGRA) 180 MG tablet Take 180 mg by mouth in the morning.     fluticasone (FLONASE) 50 MCG/ACT nasal spray Place 1 spray into both nostrils daily.     folic acid (FOLVITE) 1 MG tablet Take 1 tablet by mouth once daily 90 tablet 0    guaiFENesin (MUCINEX) 600 MG 12 hr tablet Take 600 mg by mouth 2 (two) times daily.      HYDROcodone-acetaminophen (NORCO/VICODIN) 5-325 MG tablet Take 1 tablet by mouth every 6 (six) hours as needed for moderate pain. 30 tablet 0   lidocaine (XYLOCAINE) 2 % solution Use as directed 15 mLs in the mouth or throat as needed for mouth pain. Swallow 30 min prior to meals and at bedtime for throat/esophagus pain 250 mL 1   lidocaine-prilocaine (EMLA) cream Apply 1 application topically as needed. (Patient taking differently: Apply 1 application topically as needed (port access). Every three weeks) 30 g 2   Multiple Vitamin (MULTIVITAMIN WITH MINERALS) TABS tablet Take 2 tablets by mouth daily. Alive/ gummies     ondansetron (ZOFRAN) 8 MG tablet Take 1 tablet (8 mg total) by mouth every 8 (eight) hours as needed for nausea or vomiting. 20 tablet 0   OXYGEN Inhale 3 L/min into the lungs continuous.     PROAIR HFA 108 (90 Base) MCG/ACT inhaler Inhale 1-2 puffs into the lungs every 6 (six) hours as needed for wheezing or shortness of breath. 18 g 3   prochlorperazine (COMPAZINE) 10 MG tablet Take 1 tablet (10 mg total) by mouth every 6 (six) hours as needed. (Patient taking differently: Take 10 mg by mouth every 6 (six) hours as needed (nausea with chemo).) 30 tablet 2   Soap & Cleansers (MEDERMA AG FACIAL TONER) LIQD Apply 1 application topically 2 (two) times daily. Advance dry skin     SYMBICORT 80-4.5 MCG/ACT inhaler INHALE 2 PUFFS BY MOUTH IN THE MORNING AND AT BEDTIME 11 g 0   triamcinolone ointment (KENALOG) 0.1 % Apply 1 application topically in the morning and at bedtime.     Blood Pressure Monitoring (BLOOD PRESSURE MONITOR/M CUFF) MISC Please dispense 1 medium Blood pressure cuff for home monitoring 1 each 0   No current facility-administered medications for this visit.    REVIEW OF SYSTEMS:   Constitutional: ( - ) fevers, ( - )  chills , ( - ) night sweats Eyes: ( - ) blurriness of vision, (  - ) double vision, ( - ) watery eyes Ears, nose, mouth, throat, and face: ( - ) mucositis, ( - )  sore throat Respiratory: ( - ) cough, ( - ) dyspnea, ( - ) wheezes Cardiovascular: ( + ) palpitation, ( - ) chest discomfort, ( - ) lower extremity swelling Gastrointestinal:  ( - ) nausea, ( - ) heartburn, ( - ) change in bowel habits Skin: ( - ) abnormal skin rashes Lymphatics: ( - ) new lymphadenopathy, ( - ) easy bruising Neurological: ( - ) numbness, ( - ) tingling, ( - ) new weaknesses Behavioral/Psych: ( - ) mood change, ( - ) new changes  All other systems were reviewed with the patient and are negative.  PHYSICAL EXAMINATION: ECOG PERFORMANCE STATUS: 2 - Symptomatic, <50% confined to bed  Vitals:   04/11/21 1116  BP: (!) 155/74  Pulse: 87  Resp: 17  Temp: (!) 97.5 F (36.4 C)  SpO2: 100%     Filed Weights   04/11/21 1116  Weight: 150 lb 2 oz (68.1 kg)     GENERAL: well appearing middle aged [place] American female. alert, no distress and comfortable SKIN: skin color, texture, turgor are normal, no rashes or significant lesions EYES: conjunctiva are pink and non-injected, sclera clear LUNGS: clear to auscultation and percussion with normal breathing effort HEART: regular rate & rhythm and no murmurs and no lower extremity edema Musculoskeletal: no cyanosis of digits and no clubbing.  PSYCH: alert & oriented x 3, fluent speech NEURO: no focal motor/sensory deficits   LABORATORY DATA:  I have reviewed the data as listed CBC Latest Ref Rng & Units 04/11/2021 03/25/2021 03/21/2021  WBC 4.0 - 10.5 K/uL 6.7 5.1 3.9(L)  Hemoglobin 12.0 - 15.0 g/dL 10.2(L) 10.9(L) 10.0(L)  Hematocrit 36.0 - 46.0 % 31.9(L) 34.0(L) 31.0(L)  Platelets 150 - 400 K/uL 195 183 216    CMP Latest Ref Rng & Units 04/11/2021 03/25/2021 03/21/2021  Glucose 70 - 99 mg/dL 77 189(H) 119(H)  BUN 8 - 23 mg/dL 17 14 12   Creatinine 0.44 - 1.00 mg/dL 1.13(H) 1.08(H) 1.12(H)  Sodium 135 - 145 mmol/L 142 138  144  Potassium 3.5 - 5.1 mmol/L 3.3(L) 4.6 3.5  Chloride 98 - 111 mmol/L 105 105 108  CO2 22 - 32 mmol/L 30 23 27   Calcium 8.9 - 10.3 mg/dL 9.5 9.3 8.9  Total Protein 6.5 - 8.1 g/dL 7.1 - 7.0  Total Bilirubin 0.3 - 1.2 mg/dL 0.3 - 0.2(L)  Alkaline Phos 38 - 126 U/L 50 - 50  AST 15 - 41 U/L 22 - 28  ALT 0 - 44 U/L 12 - 16    RADIOGRAPHIC STUDIES: DG Chest Port 1 View  Result Date: 03/25/2021 CLINICAL DATA:  Palpitations. EXAM: PORTABLE CHEST 1 VIEW COMPARISON:  08/28/2020 FINDINGS: 0535 hours. Stable asymmetric elevation right hemidiaphragm. Post treatment changes in the right parahilar lung are stable. Streaky opacity again noted left base, likely atelectatic. No evidence for pulmonary edema or focal airspace consolidation. Cardiopericardial silhouette is at upper limits of normal for size. Telemetry leads overlie the chest. Right Port-A-Cath again noted. IMPRESSION: Stable exam. No acute cardiopulmonary findings. Electronically Signed   By: Misty Stanley M.D.   On: 03/25/2021 05:54     ASSESSMENT & PLAN Elizabeth Mayer 61 y.o. female with medical history significant for metastatic adenocarcinoma of the lung who presents for follow up.    After review the labs, the records, discussion with the patient the findings are most consistent with metastatic adenocarcinoma the lung without any targetable mutations.  The patient has been started on carboplatin pemetrexed pembrolizumab every 3  weeks with Dr. Julien Nordmann, however the patient has expressed a desire to transition to another provider.  As such we were happy to take up her care here.  She already discontinued the carboplatin and is on maintenance pemetrexed/pembrolizumab therapy. We assumed her care with Cycle 6 on 04/04/2020.  # Metastatic Adenocarcinoma of the Lung #Brain Metastasis  --no targetable mutations noted on prior workup. --continue to follow with Dr. Laqueta Due Onc for metastatic spread to the brain.  --monitor CMP, CBC, and TSH  with each treatment --can consider docetaxel/ramucirumab at time of progression.  Plan: --CT scan shows stable disease in Sept 2022. Next one schedule for 03/27/2021.  --today is Cycle 19 Day 1 of maintenance Pem/Pem --Labs today were reviewed and adequate for treatment today.  --RTC in 3 weeks for Cycle 20 of chemotherapy  #Chemotherapy Induced Anemia --Hgb 10.0 today, stable from prior --continue to monitor   # Fatigue: --Uncertain etiology but likely chemotherapy/immunotherapy side effects  --Monitor closely  # Mid back pain: --no findings of metastasis to spine or structural problems with back on last imaging.    #Memory changes/vision changes/dizziness --Memory loss present for the last 2-3 weeks, mainly with word recognition and difficulty remembering daily tasks.  --Floaters in lower visual field of left eye, occasional.  --Dizziness is more frequent but shorter in duration.  --will request patient to follow up with Dr. Mickeal Skinner in the next 3-4 weeks as she is due for repeat MRI scan of the brain.   No orders of the defined types were placed in this encounter.   All questions were answered. The patient knows to call the clinic with any problems, questions or concerns.  I have spent a total of 30 minutes minutes of face-to-face and non-face-to-face time, preparing to see the patient, obtaining and/or reviewing separately obtained history, performing a medically appropriate examination, counseling and educating the patient, ordering tests, documenting clinical information in the electronic health record and care coordination.   Lincoln Brigham Department of Hematology/Oncology [place] at [facility] Phone: [phone]    04/14/2021 12:11 PM

## 2021-04-15 ENCOUNTER — Telehealth: Payer: Self-pay | Admitting: *Deleted

## 2021-04-15 NOTE — Telephone Encounter (Signed)
Patient called to report having received her CT scan yesterday and didn't have any issues until later she developed back pain which is baseline off and on, felt cold all over.  This morning she was having a lot of back pain and took a pain medication and then later started vomiting.  Discussed with Dr Lorenso Courier and he doesn't feel like this is related to a previously mentioned and ruled out allergy to contrast.  He recommended her to follow up with PCP.

## 2021-04-16 ENCOUNTER — Telehealth: Payer: Self-pay | Admitting: *Deleted

## 2021-04-16 NOTE — Telephone Encounter (Signed)
TCT patient regarding recent CT scan results.  Spoke with patent . Informed her that her scans show no progression of disease and no new spread of disease. Pt pleased with this result.  She states she is feeling pretty well today. She is aware of her next appts here @ [facility].

## 2021-04-16 NOTE — Telephone Encounter (Signed)
-----   Message from Orson Slick, MD sent at 04/15/2021 10:31 AM EST ----- Please let Mrs. Broden know that her CT scan shows no progression of disease or new metastatic spread.  Overall this is an excellent finding.  We will continue with her current treatment regimen.  ----- Message ----- From: Interface, Rad Results In Sent: 04/15/2021  10:22 AM EST To: Orson Slick, MD

## 2021-05-02 ENCOUNTER — Inpatient Hospital Stay: Payer: Medicaid Other

## 2021-05-02 ENCOUNTER — Other Ambulatory Visit: Payer: Self-pay | Admitting: Hematology and Oncology

## 2021-05-02 ENCOUNTER — Inpatient Hospital Stay (HOSPITAL_BASED_OUTPATIENT_CLINIC_OR_DEPARTMENT_OTHER): Payer: Medicaid Other | Admitting: Hematology and Oncology

## 2021-05-02 ENCOUNTER — Other Ambulatory Visit: Payer: Self-pay

## 2021-05-02 VITALS — BP 139/74 | HR 92 | Temp 98.1°F | Resp 18 | Wt 148.3 lb

## 2021-05-02 DIAGNOSIS — C3491 Malignant neoplasm of unspecified part of right bronchus or lung: Secondary | ICD-10-CM | POA: Diagnosis not present

## 2021-05-02 DIAGNOSIS — C7931 Secondary malignant neoplasm of brain: Secondary | ICD-10-CM

## 2021-05-02 DIAGNOSIS — Z95828 Presence of other vascular implants and grafts: Secondary | ICD-10-CM | POA: Diagnosis not present

## 2021-05-02 DIAGNOSIS — Z5112 Encounter for antineoplastic immunotherapy: Secondary | ICD-10-CM

## 2021-05-02 DIAGNOSIS — Z5111 Encounter for antineoplastic chemotherapy: Secondary | ICD-10-CM | POA: Diagnosis not present

## 2021-05-02 LAB — CBC WITH DIFFERENTIAL (CANCER CENTER ONLY)
Abs Immature Granulocytes: 0.01 10*3/uL (ref 0.00–0.07)
Basophils Absolute: 0 10*3/uL (ref 0.0–0.1)
Basophils Relative: 1 %
Eosinophils Absolute: 0.1 10*3/uL (ref 0.0–0.5)
Eosinophils Relative: 3 %
HCT: 30.7 % — ABNORMAL LOW (ref 36.0–46.0)
Hemoglobin: 10 g/dL — ABNORMAL LOW (ref 12.0–15.0)
Immature Granulocytes: 0 %
Lymphocytes Relative: 18 %
Lymphs Abs: 0.8 10*3/uL (ref 0.7–4.0)
MCH: 31.8 pg (ref 26.0–34.0)
MCHC: 32.6 g/dL (ref 30.0–36.0)
MCV: 97.8 fL (ref 80.0–100.0)
Monocytes Absolute: 0.5 10*3/uL (ref 0.1–1.0)
Monocytes Relative: 11 %
Neutro Abs: 2.9 10*3/uL (ref 1.7–7.7)
Neutrophils Relative %: 67 %
Platelet Count: 178 10*3/uL (ref 150–400)
RBC: 3.14 MIL/uL — ABNORMAL LOW (ref 3.87–5.11)
RDW: 13.8 % (ref 11.5–15.5)
WBC Count: 4.3 10*3/uL (ref 4.0–10.5)
nRBC: 0 % (ref 0.0–0.2)

## 2021-05-02 LAB — CMP (CANCER CENTER ONLY)
ALT: 10 U/L (ref 0–44)
AST: 23 U/L (ref 15–41)
Albumin: 3.9 g/dL (ref 3.5–5.0)
Alkaline Phosphatase: 42 U/L (ref 38–126)
Anion gap: 6 (ref 5–15)
BUN: 12 mg/dL (ref 8–23)
CO2: 29 mmol/L (ref 22–32)
Calcium: 9.3 mg/dL (ref 8.9–10.3)
Chloride: 106 mmol/L (ref 98–111)
Creatinine: 1.1 mg/dL — ABNORMAL HIGH (ref 0.44–1.00)
GFR, Estimated: 57 mL/min — ABNORMAL LOW (ref >60)
Glucose, Bld: 126 mg/dL — ABNORMAL HIGH (ref 70–99)
Potassium: 3.4 mmol/L — ABNORMAL LOW (ref 3.5–5.1)
Sodium: 141 mmol/L (ref 135–145)
Total Bilirubin: 0.4 mg/dL (ref 0.3–1.2)
Total Protein: 7.1 g/dL (ref 6.5–8.1)

## 2021-05-02 LAB — TSH: TSH: 1.393 u[IU]/mL (ref 0.308–3.960)

## 2021-05-02 MED ORDER — SODIUM CHLORIDE 0.9 % IV SOLN
500.0000 mg/m2 | Freq: Once | INTRAVENOUS | Status: AC
  Administered 2021-05-02: 900 mg via INTRAVENOUS
  Filled 2021-05-02: qty 20

## 2021-05-02 MED ORDER — PROCHLORPERAZINE MALEATE 10 MG PO TABS
10.0000 mg | ORAL_TABLET | Freq: Once | ORAL | Status: AC
  Administered 2021-05-02: 10 mg via ORAL
  Filled 2021-05-02: qty 1

## 2021-05-02 MED ORDER — SODIUM CHLORIDE 0.9% FLUSH
10.0000 mL | Freq: Once | INTRAVENOUS | Status: AC
  Administered 2021-05-02: 10 mL

## 2021-05-02 MED ORDER — HEPARIN SOD (PORK) LOCK FLUSH 100 UNIT/ML IV SOLN
500.0000 [IU] | Freq: Once | INTRAVENOUS | Status: AC | PRN
  Administered 2021-05-02: 500 [IU]

## 2021-05-02 MED ORDER — ACETAMINOPHEN 500 MG PO TABS
1000.0000 mg | ORAL_TABLET | Freq: Once | ORAL | Status: AC
  Administered 2021-05-02: 1000 mg via ORAL
  Filled 2021-05-02: qty 2

## 2021-05-02 MED ORDER — SODIUM CHLORIDE 0.9 % IV SOLN: Freq: Once | INTRAVENOUS | Status: AC

## 2021-05-02 MED ORDER — SODIUM CHLORIDE 0.9% FLUSH
10.0000 mL | INTRAVENOUS | Status: DC | PRN
  Administered 2021-05-02: 10 mL

## 2021-05-02 MED ORDER — SODIUM CHLORIDE 0.9 % IV SOLN
200.0000 mg | Freq: Once | INTRAVENOUS | Status: AC
  Administered 2021-05-02: 200 mg via INTRAVENOUS
  Filled 2021-05-02: qty 200

## 2021-05-02 NOTE — Patient Instructions (Signed)
[facility] ONCOLOGY  Discharge Instructions: Thank you for choosing [facility] to provide your oncology and hematology care.   If you have a lab appointment with the [facility], please go directly to the [facility] and check in at the registration area.   Wear comfortable clothing and clothing appropriate for easy access to any Portacath or PICC line.   We strive to give you quality time with your provider. You may need to reschedule your appointment if you arrive late (15 or more minutes).  Arriving late affects you and other patients whose appointments are after yours.  Also, if you miss three or more appointments without notifying the office, you may be dismissed from the clinic at the providers discretion.      For prescription refill requests, have your pharmacy contact our office and allow 72 hours for refills to be completed.    Today you received the following chemotherapy and/or immunotherapy agents Keytruda, Alimta      To help prevent nausea and vomiting after your treatment, we encourage you to take your nausea medication as directed.  BELOW ARE SYMPTOMS THAT SHOULD BE REPORTED IMMEDIATELY: *FEVER GREATER THAN 100.4 F (38 C) OR HIGHER *CHILLS OR SWEATING *NAUSEA AND VOMITING THAT IS NOT CONTROLLED WITH YOUR NAUSEA MEDICATION *UNUSUAL SHORTNESS OF BREATH *UNUSUAL BRUISING OR BLEEDING *URINARY PROBLEMS (pain or burning when urinating, or frequent urination) *BOWEL PROBLEMS (unusual diarrhea, constipation, pain near the anus) TENDERNESS IN MOUTH AND THROAT WITH OR WITHOUT PRESENCE OF ULCERS (sore throat, sores in mouth, or a toothache) UNUSUAL RASH, SWELLING OR PAIN  UNUSUAL VAGINAL DISCHARGE OR ITCHING   Items with * indicate a potential emergency and should be followed up as soon as possible or go to the Emergency Department if any problems should occur.  Please show the CHEMOTHERAPY ALERT CARD or IMMUNOTHERAPY ALERT CARD at  check-in to the Emergency Department and triage nurse.  Should you have questions after your visit or need to cancel or reschedule your appointment, please contact [facility]  Dept: [phone]  and follow the prompts.  Office hours are 8:00 a.m. to 4:30 p.m. Monday - Friday. Please note that voicemails left after 4:00 p.m. may not be returned until the following business day.  We are closed weekends and major holidays. You have access to a nurse at all times for urgent questions. Please call the main number to the clinic Dept: [phone] and follow the prompts.   For any non-urgent questions, you may also contact your provider using MyChart. We now offer e-Visits for anyone 105 and older to request care online for non-urgent symptoms. For details visit [email or web address].   Also download the MyChart app! Go to the app store, search "MyChart", open the app, select , and log in with your MyChart username and password.  Due to Covid, a mask is required upon entering the hospital/clinic. If you do not have a mask, one will be given to you upon arrival. For doctor visits, patients may have 1 support person aged 34 or older with them. For treatment visits, patients cannot have anyone with them due to current Covid guidelines and our immunocompromised population.

## 2021-05-02 NOTE — Progress Notes (Signed)
[place] [phone]   [phone]  PROGRESS NOTE  Patient Care Team: Minette Brine, FNP as PCP - General (General Practice)  Hematological/Oncological History # Metastatic Adenocarcinoma of the Lung #Brain Metastasis 1) 11/08/2019: establish care with Dr. Julien Nordmann and his PA Cassie Heilingoetter. Noted to have widely metastatic lung cancer with involvement of the brain, lymph nodes, and right lung.  2) 12/04/2019: palliative radiation to the right lung mass/cervical adenopathy 3) 9/7/02021: started therapy with Carbo/Pem/Pem 4) 9/29-10/09/2019: patient underwent SRS to the brain mets 5) 03/22/2020: repeat CT C/A/P and neck scheduled. Transfer care to Dr. Lorenso Courier.   6) 03/22/2020: CT C/A/P showed interval significant improvement in previously demonstrated extensive confluent lymphadenopathy in the superior mediastinum and right hilar regions. 7) 08/15/2020: Holding chemotherapy due to poor Hgb and fatigue. Allowing patient a brief 'chemo holiday' to rebound appropriately from last cycle.  8) 08/29/2020: restarted chemotherapy, Cycle 12 Day 1 9) 09/19/2020: patient requested to HOLD Cycle 13 in setting of fatigue/weakness.  10) 11/29/2020: Cycle 14 Day 1 of chemotherapy, delayed start due to insurance issues.   Interval History:  Elizabeth Mayer 61 y.o. female with medical history significant for metastatic adenocarcinoma of the lung who presents for follow up. The patient's last visit was on 04/11/2021 when she received Cycle 20, Day 1 of pem/pem maintenance.  In the interim since her last visit she has had no other changed in her health.   On exam today Elizabeth Mayer reports she has been well overall in the interim since her last visit.  She notes that she is having some soreness of her gums and her back to teeth and is hoping to see a dentist shortly to get this evaluated.  She notes that she does floss and taking care of her teeth and is unsure why she is having  this gum pain.  She reports he is not using any oxygen today as she was feeling good.  She notes that she is only having mild side effects as result of her treatment.  She does have some occasional episodes of dizziness as well as swelling in her feet but otherwise is willing and able to proceed with treatment today.  She denies any fevers, chills, night sweats, chest pain or cough. She has no other complaints.  A full 10 point ROS is listed below.  MEDICAL HISTORY:  Past Medical History:  Diagnosis Date   Anemia    Angina 1982   related to stress   Asthma    in the past,  no current problems   Complication of anesthesia    states anesthesia made her hair fall out   Constipation    Dyspnea    on oxygen at home - 2L via [place]   Fatigue    GERD (gastroesophageal reflux disease)    patient denies this dx   Headache    Heart murmur 1970s   no problems currently   Ingrown toenail    Lung cancer ([place]) 10/2019   metastatic disease to the brain   On home oxygen therapy    2.2 lpm, 24 hours a day   Past heart attack 1980-1981   pt states she passed out and woke up in hospital- told she had heart attack, but then dr said he couldn't find anything wrong.   Pneumonia    x 1    SURGICAL HISTORY: Past Surgical History:  Procedure Laterality Date   CERVICAL DISC SURGERY  2000   Disc removed  from neck    ingrown toe nail surgery Bilateral    IR IMAGING GUIDED PORT INSERTION  02/16/2020   MULTIPLE TOOTH EXTRACTIONS     for braces   RADIOLOGY WITH ANESTHESIA N/A 12/05/2019   Procedure: MRI BRAIN WITH AND WITHOUT CONTRAST;  Surgeon: Radiologist, Medication, MD;  Location: [place];  Service: Radiology;  Laterality: N/A;   RADIOLOGY WITH ANESTHESIA N/A 01/02/2020   Procedure: MRI BRAIN WITH AND WITHOUT CONTRAST;  Surgeon: Radiologist, Medication, MD;  Location: [place];  Service: Radiology;  Laterality: N/A;   RADIOLOGY WITH ANESTHESIA N/A 02/20/2020   Procedure: MRI WITH ANESTHESIA OF BRAIN WITH AND  WITHOUT CONTRAST;  Surgeon: Radiologist, Medication, MD;  Location: [place];  Service: Radiology;  Laterality: N/A;   RADIOLOGY WITH ANESTHESIA N/A 06/11/2020   Procedure: MRI WITH ANESTHESIA OF BRAIN WITH AND WITHOUT CONTRAST;  Surgeon: Radiologist, Medication, MD;  Location: [place];  Service: Radiology;  Laterality: N/A;   RADIOLOGY WITH ANESTHESIA N/A 10/10/2020   Procedure: MRI WITH ANESTHESIA BRAIN WITH AND WITHOUT CONTRAST;  Surgeon: Radiologist, Medication, MD;  Location: [place];  Service: Radiology;  Laterality: N/A;   RADIOLOGY WITH ANESTHESIA N/A 01/30/2021   Procedure: MRI BRAIN WITH AND WITHOUT CONTRASTWITH ANESTHESIA;  Surgeon: Radiologist, Medication, MD;  Location: [place];  Service: Radiology;  Laterality: N/A;   TONSILLECTOMY      SOCIAL HISTORY: Social History   Socioeconomic History   Marital status: Legally Separated    Spouse name: Not on file   Number of children: 0   Years of education: Not on file   Highest education level: Not on file  Occupational History   Occupation: Secondary school teacher  Tobacco Use   Smoking status: Former    Packs/day: 1.00    Years: 20.00    Pack years: 20.00    Types: Cigarettes    Start date: 63    Quit date: 2001    Years since quitting: 22.0   Smokeless tobacco: Never  Vaping Use   Vaping Use: Never used  Substance and Sexual Activity   Alcohol use: No   Drug use: No   Sexual activity: Yes    Birth control/protection: None, Post-menopausal  Other Topics Concern   Not on file  Social History Narrative   Not on file   Social Determinants of Health   Financial Resource Strain: Not on file  Food Insecurity: Not on file  Transportation Needs: Not on file  Physical Activity: Not on file  Stress: Not on file  Social Connections: Not on file  Intimate Partner Violence: Not on file    FAMILY HISTORY: Family History  Problem Relation Age of Onset   Arthritis Mother    Heart failure Father     ALLERGIES:  is allergic to  pseudoephedrine, gabapentin, mobic [meloxicam], penicillins, and prednisone.  MEDICATIONS:  Current Outpatient Medications  Medication Sig Dispense Refill   acetaminophen (TYLENOL) 500 MG tablet Take 1,000 mg by mouth in the morning and at bedtime. Taking BID     amLODipine (NORVASC) 5 MG tablet Take 1 tablet (5 mg total) by mouth daily. 30 tablet 2   benzoyl peroxide (CERAVE ACNE FOAMING CREAM) 4 % external liquid Apply 1 application topically 2 (two) times daily. Face     Blood Pressure Monitoring (BLOOD PRESSURE MONITOR/M CUFF) MISC Please dispense 1 medium Blood pressure cuff for home monitoring 1 each 0   camphor-menthol (SARNA) lotion Apply 1 application topically daily.     Carboxymethylcellulose Sodium (REFRESH CELLUVISC  OP) Place 2 drops into both eyes in the morning and at bedtime.     clindamycin (CLEOCIN T) 1 % lotion Apply 1 application topically 2 (two) times daily.     cyclobenzaprine (FLEXERIL) 10 MG tablet Take 1 tablet (10 mg total) by mouth 3 (three) times daily as needed for muscle spasms. 30 tablet 0   dexamethasone (DECADRON) 4 MG tablet Take 8 mg by mouth See admin instructions. As needed when given at chemo     diltiazem (CARDIZEM) 30 MG tablet Take 1 tablet (30 mg total) by mouth 4 (four) times daily. (Patient taking differently: Take 30 mg by mouth as needed.) 120 tablet 1   fexofenadine (ALLEGRA) 180 MG tablet Take 180 mg by mouth in the morning.     fluticasone (FLONASE) 50 MCG/ACT nasal spray Place 1 spray into both nostrils daily.     folic acid (FOLVITE) 1 MG tablet Take 1 tablet by mouth once daily 90 tablet 0   guaiFENesin (MUCINEX) 600 MG 12 hr tablet Take 600 mg by mouth 2 (two) times daily.      HYDROcodone-acetaminophen (NORCO/VICODIN) 5-325 MG tablet Take 1 tablet by mouth every 6 (six) hours as needed for moderate pain. 30 tablet 0   lidocaine (XYLOCAINE) 2 % solution Use as directed 15 mLs in the mouth or throat as needed for mouth pain. Swallow 30 min  prior to meals and at bedtime for throat/esophagus pain 250 mL 1   lidocaine-prilocaine (EMLA) cream Apply 1 application topically as needed. (Patient taking differently: Apply 1 application topically as needed (port access). Every three weeks) 30 g 2   Multiple Vitamin (MULTIVITAMIN WITH MINERALS) TABS tablet Take 2 tablets by mouth daily. Alive/ gummies     ondansetron (ZOFRAN) 8 MG tablet Take 1 tablet (8 mg total) by mouth every 8 (eight) hours as needed for nausea or vomiting. 20 tablet 0   OXYGEN Inhale 3 L/min into the lungs continuous.     PROAIR HFA 108 (90 Base) MCG/ACT inhaler Inhale 1-2 puffs into the lungs every 6 (six) hours as needed for wheezing or shortness of breath. 18 g 3   prochlorperazine (COMPAZINE) 10 MG tablet Take 1 tablet (10 mg total) by mouth every 6 (six) hours as needed. (Patient taking differently: Take 10 mg by mouth every 6 (six) hours as needed (nausea with chemo).) 30 tablet 2   Soap & Cleansers (MEDERMA AG FACIAL TONER) LIQD Apply 1 application topically 2 (two) times daily. Advance dry skin     SYMBICORT 80-4.5 MCG/ACT inhaler INHALE 2 PUFFS BY MOUTH IN THE MORNING AND AT BEDTIME 11 g 0   triamcinolone ointment (KENALOG) 0.1 % Apply 1 application topically in the morning and at bedtime.     No current facility-administered medications for this visit.   Facility-Administered Medications Ordered in Other Visits  Medication Dose Route Frequency Provider Last Rate Last Admin   sodium chloride flush (NS) 0.9 % injection 10 mL  10 mL Intracatheter PRN Orson Slick, MD   10 mL at 05/02/21 1405    REVIEW OF SYSTEMS:   Constitutional: ( - ) fevers, ( - )  chills , ( - ) night sweats Eyes: ( - ) blurriness of vision, ( - ) double vision, ( - ) watery eyes Ears, nose, mouth, throat, and face: ( - ) mucositis, ( - ) sore throat Respiratory: ( - ) cough, ( - ) dyspnea, ( - ) wheezes Cardiovascular: ( + ) palpitation, ( - )  chest discomfort, ( - ) lower extremity  swelling Gastrointestinal:  ( - ) nausea, ( - ) heartburn, ( - ) change in bowel habits Skin: ( - ) abnormal skin rashes Lymphatics: ( - ) new lymphadenopathy, ( - ) easy bruising Neurological: ( - ) numbness, ( - ) tingling, ( - ) new weaknesses Behavioral/Psych: ( - ) mood change, ( - ) new changes  All other systems were reviewed with the patient and are negative.  PHYSICAL EXAMINATION: ECOG PERFORMANCE STATUS: 2 - Symptomatic, <50% confined to bed  Vitals:   05/02/21 1144  BP: 139/74  Pulse: 92  Resp: 18  Temp: 98.1 F (36.7 C)  SpO2: 99%     Filed Weights   05/02/21 1144  Weight: 148 lb 4.8 oz (67.3 kg)     GENERAL: well appearing middle aged [place] American female. alert, no distress and comfortable SKIN: skin color, texture, turgor are normal, no rashes or significant lesions EYES: conjunctiva are pink and non-injected, sclera clear LUNGS: clear to auscultation and percussion with normal breathing effort HEART: regular rate & rhythm and no murmurs and no lower extremity edema Musculoskeletal: no cyanosis of digits and no clubbing.  PSYCH: alert & oriented x 3, fluent speech NEURO: no focal motor/sensory deficits   LABORATORY DATA:  I have reviewed the data as listed CBC Latest Ref Rng & Units 05/02/2021 04/11/2021 03/25/2021  WBC 4.0 - 10.5 K/uL 4.3 6.7 5.1  Hemoglobin 12.0 - 15.0 g/dL 10.0(L) 10.2(L) 10.9(L)  Hematocrit 36.0 - 46.0 % 30.7(L) 31.9(L) 34.0(L)  Platelets 150 - 400 K/uL 178 195 183    CMP Latest Ref Rng & Units 05/02/2021 04/11/2021 03/25/2021  Glucose 70 - 99 mg/dL 126(H) 77 189(H)  BUN 8 - 23 mg/dL 12 17 14   Creatinine 0.44 - 1.00 mg/dL 1.10(H) 1.13(H) 1.08(H)  Sodium 135 - 145 mmol/L 141 142 138  Potassium 3.5 - 5.1 mmol/L 3.4(L) 3.3(L) 4.6  Chloride 98 - 111 mmol/L 106 105 105  CO2 22 - 32 mmol/L 29 30 23   Calcium 8.9 - 10.3 mg/dL 9.3 9.5 9.3  Total Protein 6.5 - 8.1 g/dL 7.1 7.1 -  Total Bilirubin 0.3 - 1.2 mg/dL 0.4 0.3 -  Alkaline Phos  38 - 126 U/L 42 50 -  AST 15 - 41 U/L 23 22 -  ALT 0 - 44 U/L 10 12 -    RADIOGRAPHIC STUDIES: CT CHEST ABDOMEN PELVIS W CONTRAST  Result Date: 04/15/2021 CLINICAL DATA:  Metastatic non-small cell lung cancer restaging. Chemotherapy ongoing. EXAM: CT CHEST, ABDOMEN, AND PELVIS WITH CONTRAST TECHNIQUE: Multidetector CT imaging of the chest, abdomen and pelvis was performed following the standard protocol during bolus administration of intravenous contrast. CONTRAST:  79mL OMNIPAQUE IOHEXOL 300 MG/ML  SOLN COMPARISON:  CTs 12/16/2020 and 09/12/2020. FINDINGS: CT CHEST FINDINGS Cardiovascular: No acute vascular findings are identified. Right IJ Port-A-Cath tip is in the mid right atrium. There is atherosclerosis of the aorta, great vessels and coronary arteries. The heart size is normal. There is no pericardial effusion. Mediastinum/Nodes: There are no enlarged mediastinal, hilar or axillary lymph nodes. Stable treatment related soft tissue thickening in the superior and central mediastinal fat with associated mild mid esophageal wall thickening and distension. The thyroid gland and trachea demonstrate no significant findings. Lungs/Pleura: No pleural effusion or pneumothorax. Mild to moderate centrilobular emphysema with stable treatment changes in the right perihilar region and left apex. No evidence of local recurrence or suspicious pulmonary nodularity. Musculoskeletal/Chest wall: No chest wall  mass or suspicious osseous findings. Lower cervical spondylosis noted. CT ABDOMEN AND PELVIS FINDINGS Hepatobiliary: The liver is normal in density without suspicious focal abnormality. Possible small gallstone or polyp. No gallbladder wall thickening or biliary dilatation. Pancreas: Unremarkable. No pancreatic ductal dilatation or surrounding inflammatory changes. Spleen: Normal in size without focal abnormality. Adrenals/Urinary Tract: Both adrenal glands appear normal. The kidneys appear normal without evidence of  urinary tract calculus, suspicious lesion or hydronephrosis. No bladder abnormalities are seen. Stomach/Bowel: Enteric contrast was administered and has passed into the distal small bowel. The stomach appears unremarkable for its degree of distension. No evidence of bowel wall thickening, distention or surrounding inflammatory change. The appendix appears normal. Vascular/Lymphatic: There are no enlarged abdominal or pelvic lymph nodes. Aortic and branch vessel atherosclerosis without evidence of aneurysm or large vessel occlusion. Reproductive: Several uterine fibroids are again noted, similar to previous study. No suspicious adnexal findings. Other: No ascites or peritoneal nodularity.  Intact abdominal wall. Musculoskeletal: No acute or significant osseous findings. Degenerative disc disease at L5-S1. IMPRESSION: 1. Stable CTs of the chest, abdomen and pelvis. No evidence of local recurrence or metastatic disease. 2. Unchanged treatment changes in the right perihilar lung, left lung apex and mediastinal fat. 3. Uterine fibroids. 4. Aortic Atherosclerosis (ICD10-I70.0) and Emphysema (ICD10-J43.9). Electronically Signed   By: Richardean Sale M.D.   On: 04/15/2021 10:19     ASSESSMENT & PLAN Elizabeth Mayer 61 y.o. female with medical history significant for metastatic adenocarcinoma of the lung who presents for follow up.    After review the labs, the records, discussion with the patient the findings are most consistent with metastatic adenocarcinoma the lung without any targetable mutations.  The patient has been started on carboplatin pemetrexed pembrolizumab every 3 weeks with Dr. Julien Nordmann, however the patient has expressed a desire to transition to another provider.  As such we were happy to take up her care here.  She already discontinued the carboplatin and is on maintenance pemetrexed/pembrolizumab therapy. We assumed her care with Cycle 6 on 04/04/2020.  # Metastatic Adenocarcinoma of the  Lung #Brain Metastasis  --no targetable mutations noted on prior workup. --continue to follow with Dr. Laqueta Due Onc for metastatic spread to the brain.  --monitor CMP, CBC, and TSH with each treatment --can consider docetaxel/ramucirumab at time of progression.  Plan: --CT scan shows stable disease in Dec 2022. Next scan in March 2023.  --today is Cycle 21 Day 1 of maintenance Pem/Pem --Labs today were reviewed and adequate for treatment today.  --RTC in 3 weeks for Cycle 21 of chemotherapy  #Chemotherapy Induced Anemia --Hgb 10.0, MCV 97.8 today, stable from prior --continue to monitor   # Fatigue: --Uncertain etiology but likely chemotherapy/immunotherapy side effects  --Monitor closely  # Mid back pain: --no findings of metastasis to spine or structural problems with back on last imaging.    #hypokalemia: --potassium level is 3.4 --prescribed 20 mEq of oral potassium chloride and encouraged to eat potassium rich foods.   #Memory changes/vision changes/dizziness --Memory loss present for the last several weeks, mainly with word recognition and difficulty remembering daily tasks.  --Floaters in lower visual field of left eye, occasional.  --Dizziness is more frequent but shorter in duration.  --She is due for repeat MRI scan of the brain and follow up with rad onc, awaiting appts.  No orders of the defined types were placed in this encounter.   All questions were answered. The patient knows to call the clinic with any problems,  questions or concerns.  I have spent a total of 30 minutes minutes of face-to-face and non-face-to-face time, preparing to see the patient, obtaining and/or reviewing separately obtained history, performing a medically appropriate examination, counseling and educating the patient, ordering tests, documenting clinical information in the electronic health record and care coordination.   Ledell Peoples, MD Department of Hematology/Oncology [place] [facility] Phone: [phone] Pager: [phone] Email: [email or web address]  05/02/2021 3:14 PM

## 2021-05-06 ENCOUNTER — Telehealth: Payer: Self-pay | Admitting: Student in an Organized Health Care Education/Training Program

## 2021-05-06 NOTE — Telephone Encounter (Signed)
I was paged regarding the patient having tachycardia. She was recently diagnosed with Aflutter but had been in NSR. Noted having palpitations and tachycardia to the 110-120s today. Endorses a headache but denies chest pain or SOB. HR when I called her was 99 bpm. She has taken 6 prn diltiazem pills. I told her that since she is largely asymptomatic she does not need to come to the ED. I instructed her to come to the ED if she has sustained tachycardia in the 120s and/or she becomes symptomatic. Otherwise she can call her cardiologist tomorrow to guide management.

## 2021-05-08 ENCOUNTER — Other Ambulatory Visit: Payer: Self-pay

## 2021-05-08 ENCOUNTER — Encounter (HOSPITAL_COMMUNITY): Payer: Self-pay | Admitting: Nurse Practitioner

## 2021-05-08 ENCOUNTER — Ambulatory Visit (HOSPITAL_COMMUNITY)
Admission: RE | Admit: 2021-05-08 | Discharge: 2021-05-08 | Disposition: A | Discharge status: Home / Self Care | Payer: Medicaid Other | Source: Ambulatory Visit | Attending: Nurse Practitioner | Admitting: Nurse Practitioner

## 2021-05-08 VITALS — BP 146/72 | HR 84 | Ht 65.0 in | Wt 142.4 lb

## 2021-05-08 DIAGNOSIS — D6869 Other thrombophilia: Secondary | ICD-10-CM

## 2021-05-08 DIAGNOSIS — J9611 Chronic respiratory failure with hypoxia: Secondary | ICD-10-CM | POA: Insufficient documentation

## 2021-05-08 DIAGNOSIS — R9431 Abnormal electrocardiogram [ECG] [EKG]: Secondary | ICD-10-CM | POA: Diagnosis not present

## 2021-05-08 DIAGNOSIS — Z85118 Personal history of other malignant neoplasm of bronchus and lung: Secondary | ICD-10-CM | POA: Diagnosis not present

## 2021-05-08 DIAGNOSIS — Z7952 Long term (current) use of systemic steroids: Secondary | ICD-10-CM | POA: Insufficient documentation

## 2021-05-08 DIAGNOSIS — I483 Typical atrial flutter: Secondary | ICD-10-CM | POA: Diagnosis not present

## 2021-05-08 DIAGNOSIS — I4892 Unspecified atrial flutter: Secondary | ICD-10-CM | POA: Insufficient documentation

## 2021-05-08 DIAGNOSIS — Z79899 Other long term (current) drug therapy: Secondary | ICD-10-CM | POA: Diagnosis not present

## 2021-05-08 NOTE — Progress Notes (Signed)
Primary Care Physician: Minette Brine, FNP Referring Physician: ED f/u    Elizabeth Mayer is a 61 y.o. female with a h/o  non-small cell lung cancer, chronic hypoxic respiratory failure on oxygen who presented to ED with palpitations.  Patient reported that she woke up this morning around 3 AM with palpitations.  She was scheduled to have a CT scan later this morning and has been taking prednisone for pretreatment. She took 1 at 9 PM and woke up this morning at 3 AM to take her prednisone.  At that time she had onset of heart racing.  No chest pain or shortness of breath. EKG showed probable typical atrail flutter at 138 bpm.   She has a history of atrial fibrillation remotely but is not currently undergoing any treatment and does not need blood thinners. She did convert in the ER with Cardizem and with a CHA2DS2VASc of 1, anticoagulation was not started. In the afib clinic today, she is in [place] and has not noted any further afib.   F/u in afib clinic, 05/08/21. She is in SR now, but went into afib after chemo tx on Friday. HR averaged in the 90's. She feels she went into SR just a few hours earlier. Her first  episode was probably triggered by prednisone use. She did use her 30 mg Cardizem during episode.   Today, she denies symptoms of palpitations, chest pain, shortness of breath, orthopnea, PND, lower extremity edema, dizziness, presyncope, syncope, or neurologic sequela. The patient is tolerating medications without difficulties and is otherwise without complaint today.   Past Medical History:  Diagnosis Date   Anemia    Angina 1982   related to stress   Asthma    in the past,  no current problems   Complication of anesthesia    states anesthesia made her hair fall out   Constipation    Dyspnea    on oxygen at home - 2L via [place]   Fatigue    GERD (gastroesophageal reflux disease)    patient denies this dx   Headache    Heart murmur 1970s   no problems currently   Ingrown toenail     Lung cancer ([place]) 10/2019   metastatic disease to the brain   On home oxygen therapy    2.2 lpm, 24 hours a day   Past heart attack 1980-1981   pt states she passed out and woke up in hospital- told she had heart attack, but then dr said he couldn't find anything wrong.   Pneumonia    x 1   Past Surgical History:  Procedure Laterality Date   CERVICAL DISC SURGERY  2000   Disc removed from neck    ingrown toe nail surgery Bilateral    IR IMAGING GUIDED PORT INSERTION  02/16/2020   MULTIPLE TOOTH EXTRACTIONS     for braces   RADIOLOGY WITH ANESTHESIA N/A 12/05/2019   Procedure: MRI BRAIN WITH AND WITHOUT CONTRAST;  Surgeon: Radiologist, Medication, MD;  Location: [place] [facility];  Service: Radiology;  Laterality: N/A;   RADIOLOGY WITH ANESTHESIA N/A 01/02/2020   Procedure: MRI BRAIN WITH AND WITHOUT CONTRAST;  Surgeon: Radiologist, Medication, MD;  Location: [facility];  Service: Radiology;  Laterality: N/A;   RADIOLOGY WITH ANESTHESIA N/A 02/20/2020   Procedure: MRI WITH ANESTHESIA OF BRAIN WITH AND WITHOUT CONTRAST;  Surgeon: Radiologist, Medication, MD;  Location: [facility];  Service: Radiology;  Laterality: N/A;   RADIOLOGY WITH ANESTHESIA N/A 06/11/2020   Procedure: MRI  WITH ANESTHESIA OF BRAIN WITH AND WITHOUT CONTRAST;  Surgeon: Radiologist, Medication, MD;  Location: [facility];  Service: Radiology;  Laterality: N/A;   RADIOLOGY WITH ANESTHESIA N/A 10/10/2020   Procedure: MRI WITH ANESTHESIA BRAIN WITH AND WITHOUT CONTRAST;  Surgeon: Radiologist, Medication, MD;  Location: [place];  Service: Radiology;  Laterality: N/A;   RADIOLOGY WITH ANESTHESIA N/A 01/30/2021   Procedure: MRI BRAIN WITH AND WITHOUT CONTRASTWITH ANESTHESIA;  Surgeon: Radiologist, Medication, MD;  Location: [place];  Service: Radiology;  Laterality: N/A;   TONSILLECTOMY      Current Outpatient Medications  Medication Sig Dispense Refill   acetaminophen (TYLENOL) 500 MG tablet Take 1,000 mg by mouth in the morning and at bedtime. Taking BID      amLODipine (NORVASC) 5 MG tablet Take 1 tablet (5 mg total) by mouth daily. 30 tablet 2   benzoyl peroxide (CERAVE ACNE FOAMING CREAM) 4 % external liquid Apply 1 application topically 2 (two) times daily. Face     Blood Pressure Monitoring (BLOOD PRESSURE MONITOR/M CUFF) MISC Please dispense 1 medium Blood pressure cuff for home monitoring 1 each 0   camphor-menthol (SARNA) lotion Apply 1 application topically daily.     Carboxymethylcellulose Sodium (REFRESH CELLUVISC OP) Place 2 drops into both eyes in the morning and at bedtime.     clindamycin (CLEOCIN T) 1 % lotion Apply 1 application topically 2 (two) times daily.     cyclobenzaprine (FLEXERIL) 10 MG tablet Take 1 tablet (10 mg total) by mouth 3 (three) times daily as needed for muscle spasms. 30 tablet 0   dexamethasone (DECADRON) 4 MG tablet Take 8 mg by mouth See admin instructions. As needed when given at chemo     fexofenadine (ALLEGRA) 180 MG tablet Take 180 mg by mouth in the morning.     fluticasone (FLONASE) 50 MCG/ACT nasal spray Place 1 spray into both nostrils daily.     folic acid (FOLVITE) 1 MG tablet Take 1 tablet by mouth once daily 90 tablet 0   guaiFENesin (MUCINEX) 600 MG 12 hr tablet Take 600 mg by mouth 2 (two) times daily.      HYDROcodone-acetaminophen (NORCO/VICODIN) 5-325 MG tablet Take 1 tablet by mouth every 6 (six) hours as needed for moderate pain. 30 tablet 0   lidocaine (XYLOCAINE) 2 % solution Use as directed 15 mLs in the mouth or throat as needed for mouth pain. Swallow 30 min prior to meals and at bedtime for throat/esophagus pain 250 mL 1   lidocaine-prilocaine (EMLA) cream Apply 1 application topically as needed. (Patient taking differently: Apply 1 application topically as needed (port access). Every three weeks) 30 g 2   Multiple Vitamin (MULTIVITAMIN WITH MINERALS) TABS tablet Take 2 tablets by mouth daily. Alive/ gummies     ondansetron (ZOFRAN) 8 MG tablet Take 1 tablet (8 mg total) by mouth every 8  (eight) hours as needed for nausea or vomiting. 20 tablet 0   OXYGEN Inhale 3 L/min into the lungs continuous.     PROAIR HFA 108 (90 Base) MCG/ACT inhaler Inhale 1-2 puffs into the lungs every 6 (six) hours as needed for wheezing or shortness of breath. 18 g 3   prochlorperazine (COMPAZINE) 10 MG tablet Take 1 tablet (10 mg total) by mouth every 6 (six) hours as needed. (Patient taking differently: Take 10 mg by mouth every 6 (six) hours as needed (nausea with chemo).) 30 tablet 2   Soap & Cleansers (MEDERMA AG FACIAL TONER) LIQD Apply 1 application topically  2 (two) times daily. Advance dry skin     SYMBICORT 80-4.5 MCG/ACT inhaler INHALE 2 PUFFS BY MOUTH IN THE MORNING AND AT BEDTIME 11 g 0   triamcinolone ointment (KENALOG) 0.1 % Apply 1 application topically in the morning and at bedtime.     diltiazem (CARDIZEM) 30 MG tablet Take 1 tablet (30 mg total) by mouth 4 (four) times daily. (Patient not taking: Reported on 05/08/2021) 120 tablet 1   No current facility-administered medications for this encounter.    Allergies  Allergen Reactions   Pseudoephedrine Hypertension   Gabapentin Other (See Comments)    Raise blood pressure and red rings around eyes Blood vessels popped in her eyes   Mobic [Meloxicam] Swelling    Inflamed the area that has inflammation and stabbing pains in the area   Penicillins Hives    Reaction: Childhood   Prednisone     Caused A-fib    Social History   Socioeconomic History   Marital status: Legally Separated    Spouse name: Not on file   Number of children: 0   Years of education: Not on file   Highest education level: Not on file  Occupational History   Occupation: Secondary school teacher  Tobacco Use   Smoking status: Former    Packs/day: 1.00    Years: 20.00    Pack years: 20.00    Types: Cigarettes    Start date: 4    Quit date: 2001    Years since quitting: 22.1   Smokeless tobacco: Never  Vaping Use   Vaping Use: Never used  Substance and  Sexual Activity   Alcohol use: No   Drug use: No   Sexual activity: Yes    Birth control/protection: None, Post-menopausal  Other Topics Concern   Not on file  Social History Narrative   Not on file   Social Determinants of Health   Financial Resource Strain: Not on file  Food Insecurity: Not on file  Transportation Needs: Not on file  Physical Activity: Not on file  Stress: Not on file  Social Connections: Not on file  Intimate Partner Violence: Not on file    Family History  Problem Relation Age of Onset   Arthritis Mother    Heart failure Father     ROS- All systems are reviewed and negative except as per the HPI above  Physical Exam: Vitals:   05/08/21 1503  BP: (!) 146/72  Pulse: 84  Weight: 64.6 kg  Height: 5\' 5"  (1.651 m)   Wt Readings from Last 3 Encounters:  05/08/21 64.6 kg  05/02/21 67.3 kg  04/11/21 68.1 kg    Labs: Lab Results  Component Value Date   NA 141 05/02/2021   K 3.4 (L) 05/02/2021   CL 106 05/02/2021   CO2 29 05/02/2021   GLUCOSE 126 (H) 05/02/2021   BUN 12 05/02/2021   CREATININE 1.10 (H) 05/02/2021   CALCIUM 9.3 05/02/2021   MG 1.8 04/11/2021   Lab Results  Component Value Date   INR 0.9 02/16/2020   No results found for: CHOL, HDL, LDLCALC, TRIG   GEN- The patient is well appearing, alert and oriented x 3 today.   Head- normocephalic, atraumatic Eyes-  Sclera clear, conjunctiva pink Ears- hearing intact Oropharynx- clear Neck- supple, no JVP Lymph- no cervical lymphadenopathy Lungs- Clear to ausculation bilaterally, normal work of breathing Heart- Regular rate and rhythm, no murmurs, rubs or gallops, PMI not laterally displaced GI- soft, NT, ND, + BS Extremities-  no clubbing, cyanosis, or edema MS- no significant deformity or atrophy Skin- no rash or lesion Psych- euthymic mood, full affect Neuro- strength and sensation are intact  EKG-NSR at 88 bpm pr int 130 bpm, qrs int 60 ms, qtc 444 ms  ER ekg shows  probable typical  atrial flutter at  138 bpm   Assessment and Plan:  1. Atrial  flutter Possibly induced by prednisone dose Recent episode that came on with chemo therapy.  Now back in SR She has 30 mg Cardizem to use,  if needed for palpitations   2. CHA2DS2VASc  score of 1 No need for anticoagulation per guidelines   F/u with afib clinic  in 3 weeks   Butch Penny C. Phelicia Dantes, [facility] [address] [place], [place] [address] [phone]

## 2021-05-13 ENCOUNTER — Ambulatory Visit (INDEPENDENT_AMBULATORY_CARE_PROVIDER_SITE_OTHER): Payer: Medicaid Other | Admitting: Nurse Practitioner

## 2021-05-13 ENCOUNTER — Ambulatory Visit: Payer: Self-pay | Admitting: Radiation Oncology

## 2021-05-13 ENCOUNTER — Encounter: Payer: Self-pay | Admitting: Nurse Practitioner

## 2021-05-13 ENCOUNTER — Other Ambulatory Visit: Payer: Self-pay | Admitting: Emergency Medicine

## 2021-05-13 ENCOUNTER — Other Ambulatory Visit: Payer: Self-pay

## 2021-05-13 VITALS — BP 118/72 | HR 88 | Temp 98.1°F | Wt 146.8 lb

## 2021-05-13 DIAGNOSIS — J449 Chronic obstructive pulmonary disease, unspecified: Secondary | ICD-10-CM

## 2021-05-13 DIAGNOSIS — C3491 Malignant neoplasm of unspecified part of right bronchus or lung: Secondary | ICD-10-CM | POA: Diagnosis not present

## 2021-05-13 DIAGNOSIS — E876 Hypokalemia: Secondary | ICD-10-CM

## 2021-05-13 DIAGNOSIS — Z9989 Dependence on other enabling machines and devices: Secondary | ICD-10-CM

## 2021-05-13 DIAGNOSIS — I119 Hypertensive heart disease without heart failure: Secondary | ICD-10-CM | POA: Diagnosis not present

## 2021-05-13 DIAGNOSIS — I10 Essential (primary) hypertension: Secondary | ICD-10-CM

## 2021-05-13 DIAGNOSIS — J4489 Other specified chronic obstructive pulmonary disease: Secondary | ICD-10-CM

## 2021-05-13 DIAGNOSIS — Z23 Encounter for immunization: Secondary | ICD-10-CM | POA: Diagnosis not present

## 2021-05-13 DIAGNOSIS — I2510 Atherosclerotic heart disease of native coronary artery without angina pectoris: Secondary | ICD-10-CM | POA: Diagnosis not present

## 2021-05-13 MED ORDER — TETANUS-DIPHTH-ACELL PERTUSSIS 5-2.5-18.5 LF-MCG/0.5 IM SUSP: 0.5000 mL | Freq: Once | INTRAMUSCULAR | 0 refills | Status: DC

## 2021-05-13 NOTE — Patient Instructions (Addendum)
Hypertension, Adult Hypertension is another name for high blood pressure. High blood pressure forces your heart to work harder to pump blood. This can cause problems over time. There are two numbers in a blood pressure reading. There is a top number (systolic) over a bottom number (diastolic). It is best to have a blood pressure that is below 120/80. Healthy choices can help lower your blood pressure, or you may need medicine to help lower it. What are the causes? The cause of this condition is not known. Some conditions may be related to high blood pressure. What increases the risk? Smoking. Having type 2 diabetes mellitus, high cholesterol, or both. Not getting enough exercise or physical activity. Being overweight. Having too much fat, sugar, calories, or salt (sodium) in your diet. Drinking too much alcohol. Having long-term (chronic) kidney disease. Having a family history of high blood pressure. Age. Risk increases with age. Race. You may be at higher risk if you are African American. Gender. Men are at higher risk than women before age 62. After age 68, women are at higher risk than men. Having obstructive sleep apnea. Stress. What are the signs or symptoms? High blood pressure may not cause symptoms. Very high blood pressure (hypertensive crisis) may cause: Headache. Feelings of worry or nervousness (anxiety). Shortness of breath. Nosebleed. A feeling of being sick to your stomach (nausea). Throwing up (vomiting). Changes in how you see. Very bad chest pain. Seizures. How is this treated? This condition is treated by making healthy lifestyle changes, such as: Eating healthy foods. Exercising more. Drinking less alcohol. Your health care provider may prescribe medicine if lifestyle changes are not enough to get your blood pressure under control, and if: Your top number is above 130. Your bottom number is above 80. Your personal target blood pressure may vary. Follow  these instructions at home: Eating and drinking  If told, follow the DASH eating plan. To follow this plan: Fill one half of your plate at each meal with fruits and vegetables. Fill one fourth of your plate at each meal with whole grains. Whole grains include whole-wheat pasta, brown rice, and whole-grain bread. Eat or drink low-fat dairy products, such as skim milk or low-fat yogurt. Fill one fourth of your plate at each meal with low-fat (lean) proteins. Low-fat proteins include fish, chicken without skin, eggs, beans, and tofu. Avoid fatty meat, cured and processed meat, or chicken with skin. Avoid pre-made or processed food. Eat less than 1,500 mg of salt each day. Do not drink alcohol if: Your doctor tells you not to drink. You are pregnant, may be pregnant, or are planning to become pregnant. If you drink alcohol: Limit how much you use to: 0-1 drink a day for women. 0-2 drinks a day for men. Be aware of how much alcohol is in your drink. In the [place] one drink equals one 12 oz bottle of beer (355 mL), one 5 oz glass of wine (148 mL), or one 1 oz glass of hard liquor (44 mL). Lifestyle  Work with your doctor to stay at a healthy weight or to lose weight. Ask your doctor what the best weight is for you. Get at least 30 minutes of exercise most days of the week. This may include walking, swimming, or biking. Get at least 30 minutes of exercise that strengthens your muscles (resistance exercise) at least 3 days a week. This may include lifting weights or doing Pilates. Do not use any products that contain nicotine or tobacco, such  as cigarettes, e-cigarettes, and chewing tobacco. If you need help quitting, ask your doctor. Check your blood pressure at home as told by your doctor. Keep all follow-up visits as told by your doctor. This is important. Medicines Take over-the-counter and prescription medicines only as told by your doctor. Follow directions carefully. Do not skip doses of  blood pressure medicine. The medicine does not work as well if you skip doses. Skipping doses also puts you at risk for problems. Ask your doctor about side effects or reactions to medicines that you should watch for. Contact a doctor if you: Think you are having a reaction to the medicine you are taking. Have headaches that keep coming back (recurring). Feel dizzy. Have swelling in your ankles. Have trouble with your vision. Get help right away if you: Get a very bad headache. Start to feel mixed up (confused). Feel weak or numb. Feel faint. Have very bad pain in your: Chest. Belly (abdomen). Throw up more than once. Have trouble breathing. Summary Hypertension is another name for high blood pressure. High blood pressure forces your heart to work harder to pump blood. For most people, a normal blood pressure is less than 120/80. Making healthy choices can help lower blood pressure. If your blood pressure does not get lower with healthy choices, you may need to take medicine. This information is not intended to replace advice given to you by your health care provider. Make sure you discuss any questions you have with your health care provider. Document Revised: 12/01/2017 Document Reviewed: 12/01/2017 Elsevier Patient Education  [place].  Potassium Content of Foods Potassium is a mineral found in many foods and drinks. It affects how the heart works, and helps keep fluids and minerals balanced in the body. The amount of potassium you need each day depends on your age and any medical conditions you may have. Talk to your health care provider or dietitian about how much potassium you need. The following lists of foods provide the general serving size for foods and the approximate amount of potassium in each serving, listed in milligrams (mg). Actual values may vary depending on the product and how it is processed. High in potassium The following foods and beverages have 200  mg or more of potassium per serving: Apricots (raw) - 2 have 200 mg of potassium. Apricots (dry) - 5 have 200 mg of potassium. Artichoke - 1 medium has 345 mg of potassium. Avocado -  fruit has 245 mg of potassium. Banana - 1 medium fruit has 425 mg of potassium. [place] or baked beans (canned) -  cup has 280 mg of potassium. White beans (canned) -  cup has 595 mg potassium. Beef roast - 3 oz has 320 mg of potassium. Ground beef - 3 oz has 270 mg of potassium. Beets (raw or cooked) -  cup has 260 mg of potassium. Bran muffin - 2 oz has 300 mg of potassium. Broccoli (cooked) -  cup has 230 mg of potassium. Brussels sprouts -  cup has 250 mg of potassium. Cantaloupe -  cup has 215 mg of potassium. Cereal, 100% bran -  cup has 200-400 mg of potassium. Cheeseburger -1 single fast food burger has 225-400 mg of potassium. Chicken - 3 oz has 220 mg of potassium. Clams (canned) - 3 oz has 535 mg of potassium. Crab - 3 oz has 225 mg of potassium. Dates - 5 have 270 mg of potassium. Dried beans and peas -  cup has 300-475 mg of  potassium. Figs (dried) - 2 have 260 mg of potassium. Fish (halibut, tuna, cod, snapper) - 3 oz has 480 mg of potassium. Fish (salmon, haddock, swordfish, perch) - 3 oz has 300 mg of potassium. Fish (tuna, canned) - 3 oz has 200 mg of potassium. [place] fries (fast food) - 3 oz has 470 mg of potassium. Granola with fruit and nuts -  cup has 200 mg of potassium. Grapefruit juice -  cup has 200 mg of potassium. Honeydew melon -  cup has 200 mg of potassium. Kale (raw) - 1 cup has 300 mg of potassium. Kiwi - 1 medium fruit has 240 mg of potassium. Kohlrabi, rutabaga, parsnips -  cup has 280 mg of potassium. Lentils -  cup has 365 mg of potassium. Mango - 1 each has 325 mg of potassium. Milk (nonfat, low-fat, whole, buttermilk) - 1 cup has 350-380 mg of potassium. Milk (chocolate) - 1 cup has 420 mg of potassium Molasses - 1 Tbsp has 295 mg of  potassium. Mushrooms -  cup has 280 mg of potassium. Nectarine - 1 each has 275 mg of potassium. Nuts (almonds, peanuts, hazelnuts, [place], cashew, mixed) - 1 oz has 200 mg of potassium. Nuts (pistachios) - 1 oz has 295 mg of potassium. Orange - 1 fruit has 240 mg of potassium. Orange juice -  cup has 235 mg of potassium. Papaya -  medium fruit has 390 mg of potassium. Peanut butter (chunky) - 2 Tbsp has 240 mg of potassium. Peanut butter (smooth) - 2 Tbsp has 210 mg of potassium. Pear - 1 medium (200 mg) of potassium. Pomegranate - 1 whole fruit has 400 mg of potassium. Pomegranate juice -  cup has 215 mg of potassium. Pork - 3 oz has 350 mg of potassium. Potato chips (salted) - 1 oz has 465 mg of potassium. Potato (baked with skin) - 1 medium has 925 mg of potassium. Potato (boiled) -  cup has 255 mg of potassium. Potato (Mashed) -  cup has 330 mg of potassium. Prune juice -  cup has 370 mg of potassium. Prunes - 5 have 305 mg of potassium. Pudding (chocolate) -  cup has 230 mg of potassium. Pumpkin (canned) -  cup has 250 mg of potassium. Raisins (seedless) -  cup has 270 mg of potassium. Seeds (sunflower or pumpkin) - 1 oz has 240 mg of potassium. Soy milk - 1 cup has 300 mg of potassium. Spinach (cooked) - 1/2 cup has 420 mg of potassium. Spinach (canned) -  cup has 370 mg of potassium. Sweet potato (baked with skin) - 1 medium has 450 mg of potassium. Swiss chard -  cup has 480 mg of potassium. Tomato or vegetable juice -  cup has 275 mg of potassium. Tomato (sauce or puree) -  cup has 400-550 mg of potassium. Tomato (raw) - 1 medium has 290 mg of potassium. Tomato (canned) -  cup has 200-300 mg of potassium. [place] - 3 oz has 250 mg of potassium. Wheat germ - 1 oz has 250 mg of potassium. Winter squash -  cup has 250 mg of potassium. Yogurt (plain or fruited) - 6 oz has 260-435 mg of potassium. Zucchini -  cup has 220 mg of potassium. Moderate in  potassium The following foods and beverages have 50-200 mg of potassium per serving: Apple - 1 fruit has 150 mg of potassium Apple juice -  cup has 150 mg of potassium Applesauce -  cup has 90 mg of potassium Apricot  nectar -  cup has 140 mg of potassium Asparagus (small spears) -  cup has 155 mg of potassium Asparagus (large spears) - 6 have 155 mg of potassium Bagel (cinnamon raisin) - 1 four-inch bagel has 130 mg of potassium Bagel (egg or plain) - 1 four- inch bagel has 70 mg of potassium Beans (green) -  cup has 90 mg of potassium Beans (yellow) -  cup has 190 mg of potassium Beer, regular - 12 oz has 100 mg of potassium Beets (canned) -  cup has 125 mg of potassium Blackberries -  cup has 115 mg of potassium Blueberries -  cup has 60 mg of potassium Bread (whole wheat) - 1 slice has 70 mg of potassium Broccoli (raw) -  cup has 145 mg of potassium Cabbage -  cup has 150 mg of potassium Carrots (cooked or raw) -  cup has 180 mg of potassium Cauliflower (raw) -  cup has 150 mg of potassium Celery (raw) -  cup has 155 mg of potassium Cereal, bran flakes -  cup has 120-150 mg of potassium Cheese (cottage) -  cup has 110 mg of potassium Cherries - 10 have 150 mg of potassium Chocolate - 1 oz bar has 165 mg of potassium Coffee (brewed) - 6 oz has 90 mg of potassium Corn -  cup or 1 ear has 195 mg of potassium Cucumbers -  cup has 80 mg of potassium Egg - 1 large egg has 60 mg of potassium Eggplant -  cup has 60 mg of potassium Endive (raw) -  cup has 80 mg of potassium English muffin - 1 has 65 mg of potassium Fish (ocean perch) - 3 oz has 192 mg of potassium Frankfurter, beef or pork - 1 has 75 mg of potassium Fruit cocktail -  cup has 115 mg of potassium Grape juice -  cup has 170 mg of potassium Grapefruit -  fruit has 175 mg of potassium Grapes -  cup has 155 mg of potassium Greens: kale, turnip, collard -  cup has 110-150 mg of potassium Ice cream  or frozen yogurt (chocolate) -  cup has 175 mg of potassium Ice cream or frozen yogurt (vanilla) -  cup has 120-150 mg of potassium Lemons, limes - 1 each has 80 mg of potassium Lettuce - 1 cup has 100 mg of potassium Mixed vegetables -  cup has 150 mg of potassium Mushrooms, raw -  cup has 110 mg of potassium Nuts (walnuts, pecans, or macadamia) - 1 oz has 125 mg of potassium Oatmeal -  cup has 80 mg of potassium Okra -  cup has 110 mg of potassium Onions -  cup has 120 mg of potassium Peach - 1 has 185 mg of potassium Peaches (canned) -  cup has 120 mg of potassium Pears (canned) -  cup has 120 mg of potassium Peas, green (frozen) -  cup has 90 mg of potassium Peppers (Green) -  cup has 130 mg of potassium Peppers (Red) -  cup has 160 mg of potassium Pineapple juice -  cup has 165 mg of potassium Pineapple (fresh or canned) -  cup has 100 mg of potassium Plums - 1 has 105 mg of potassium Pudding, vanilla -  cup has 150 mg of potassium Raspberries -  cup has 90 mg of potassium Rhubarb -  cup has 115 mg of potassium Rice, wild -  cup has 80 mg of potassium Shrimp - 3 oz has 155 mg of potassium  Spinach (raw) - 1 cup has 170 mg of potassium Strawberries -  cup has 125 mg of potassium Summer squash -  cup has 175-200 mg of potassium Swiss chard (raw) - 1 cup has 135 mg of potassium Tangerines - 1 fruit has 140 mg of potassium Tea, brewed - 6 oz has 65 mg of potassium Turnips -  cup has 140 mg of potassium Watermelon -  cup has 85 mg of potassium Wine (Red, table) - 5 oz has 180 mg of potassium Wine (White, table) - 5 oz 100 mg of potassium Low in potassium The following foods and beverages have less than 50 mg of potassium per serving. Bread (white) - 1 slice has 30 mg of potassium Carbonated beverages - 12 oz has less than 5 mg of potassium Cheese - 1 oz has 20-30 mg of potassium Cranberries -  cup has 45 mg of potassium Cranberry juice cocktail -  cup  has 20 mg of potassium Fats and oils - 1 Tbsp has less than 5 mg of potassium Hummus - 1 Tbsp has 32 mg of potassium Nectar (papaya, mango, or pear) -  cup has 35 mg of potassium Rice (white or brown) -  cup has 50 mg of potassium Spaghetti or macaroni (cooked) -  cup has 30 mg of potassium Tortilla, flour or corn - 1 has 50 mg of potassium Waffle - 1 four-inch waffle has 50 mg of potassium Water chestnuts -  cup has 40 mg of potassium Summary Potassium is a mineral found in many foods and drinks. It affects how the heart works, and helps keep fluids and minerals balanced in the body. The amount of potassium you need each day depends on your age and any existing medical conditions you may have. Your health care provider or dietitian may recommend an amount of potassium that you should have each day. This information is not intended to replace advice given to you by your health care provider. Make sure you discuss any questions you have with your health care provider. Document Revised: 03/05/2017 Document Reviewed: 06/17/2016 Elsevier Patient Education  2022 [place] [facility] [employer] at [employer] [facility] [place]  [phone]

## 2021-05-13 NOTE — Progress Notes (Signed)
I,Victoria T Hamilton,acting as a Education administrator for Minette Brine, FNP.,have documented all relevant documentation on the behalf of Minette Brine, FNP,as directed by  Minette Brine, FNP while in the presence of Minette Brine, [place].   This visit occurred during the SARS-CoV-2 public health emergency.  Safety protocols were in place, including screening questions prior to the visit, additional usage of staff PPE, and extensive cleaning of exam room while observing appropriate contact time as indicated for disinfecting solutions.  Subjective:     Patient ID: Elizabeth Mayer , female    DOB: 1960-08-27 , 61 y.o.   MRN: [id number]   Chief Complaint  Patient presents with   Hypertension    HPI  Pt presents for BP f/u.  Continues to have allergic reactions to the chemo and the prednisone has caused her A-fib. She is just getting back to feeling better. The more movement she makes the higher her heart rate is. They are trying to figure out how to control the Afib. She is feeling like it is not worth it. She is getting chemo every 3 weeks and by the time she feels better it is time for another round. She is allergic to the chemo.   Hypertension This is a chronic problem. The current episode started more than 1 year ago. The problem is controlled. There are no associated agents to hypertension. Risk factors for coronary artery disease include sedentary lifestyle. Past treatments include calcium channel blockers.    Past Medical History:  Diagnosis Date   Anemia    Angina 1982   related to stress   Asthma    in the past,  no current problems   Complication of anesthesia    states anesthesia made her hair fall out   Constipation    Dyspnea    on oxygen at home - 2L via [place]   Fatigue    GERD (gastroesophageal reflux disease)    patient denies this dx   Headache    Heart murmur 1970s   no problems currently   Ingrown toenail    Lung cancer ([place]) 10/2019   metastatic disease to the brain   On home  oxygen therapy    2.2 lpm, 24 hours a day   Past heart attack 1980-1981   pt states she passed out and woke up in hospital- told she had heart attack, but then dr said he couldn't find anything wrong.   Pneumonia    x 1     Family History  Problem Relation Age of Onset   Arthritis Mother    Heart failure Father      Current Outpatient Medications:    acetaminophen (TYLENOL) 500 MG tablet, Take 1,000 mg by mouth in the morning and at bedtime. Taking BID, Disp: , Rfl:    amLODipine (NORVASC) 5 MG tablet, Take 1 tablet (5 mg total) by mouth daily., Disp: 30 tablet, Rfl: 2   benzoyl peroxide (CERAVE ACNE FOAMING CREAM) 4 % external liquid, Apply 1 application topically 2 (two) times daily. Face, Disp: , Rfl:    Blood Pressure Monitoring (BLOOD PRESSURE MONITOR/M CUFF) MISC, Please dispense 1 medium Blood pressure cuff for home monitoring, Disp: 1 each, Rfl: 0   camphor-menthol (SARNA) lotion, Apply 1 application topically daily., Disp: , Rfl:    Carboxymethylcellulose Sodium (REFRESH CELLUVISC OP), Place 2 drops into both eyes in the morning and at bedtime., Disp: , Rfl:    clindamycin (CLEOCIN T) 1 % lotion, Apply 1 application topically 2 (two)  times daily., Disp: , Rfl:    cyclobenzaprine (FLEXERIL) 10 MG tablet, Take 1 tablet (10 mg total) by mouth 3 (three) times daily as needed for muscle spasms., Disp: 30 tablet, Rfl: 0   dexamethasone (DECADRON) 4 MG tablet, Take 8 mg by mouth See admin instructions. As needed when given at chemo, Disp: , Rfl:    fexofenadine (ALLEGRA) 180 MG tablet, Take 180 mg by mouth in the morning., Disp: , Rfl:    fluticasone (FLONASE) 50 MCG/ACT nasal spray, Place 1 spray into both nostrils daily., Disp: , Rfl:    folic acid (FOLVITE) 1 MG tablet, Take 1 tablet by mouth once daily, Disp: 90 tablet, Rfl: 0   guaiFENesin (MUCINEX) 600 MG 12 hr tablet, Take 600 mg by mouth 2 (two) times daily. , Disp: , Rfl:    HYDROcodone-acetaminophen (NORCO/VICODIN) 5-325 MG  tablet, Take 1 tablet by mouth every 6 (six) hours as needed for moderate pain., Disp: 30 tablet, Rfl: 0   lidocaine (XYLOCAINE) 2 % solution, Use as directed 15 mLs in the mouth or throat as needed for mouth pain. Swallow 30 min prior to meals and at bedtime for throat/esophagus pain, Disp: 250 mL, Rfl: 1   lidocaine-prilocaine (EMLA) cream, Apply 1 application topically as needed. (Patient taking differently: Apply 1 application topically as needed (port access). Every three weeks), Disp: 30 g, Rfl: 2   Multiple Vitamin (MULTIVITAMIN WITH MINERALS) TABS tablet, Take 2 tablets by mouth daily. Alive/ gummies, Disp: , Rfl:    ondansetron (ZOFRAN) 8 MG tablet, Take 1 tablet (8 mg total) by mouth every 8 (eight) hours as needed for nausea or vomiting., Disp: 20 tablet, Rfl: 0   OXYGEN, Inhale 3 L/min into the lungs continuous., Disp: , Rfl:    PROAIR HFA 108 (90 Base) MCG/ACT inhaler, Inhale 1-2 puffs into the lungs every 6 (six) hours as needed for wheezing or shortness of breath., Disp: 18 g, Rfl: 3   prochlorperazine (COMPAZINE) 10 MG tablet, Take 1 tablet (10 mg total) by mouth every 6 (six) hours as needed. (Patient taking differently: Take 10 mg by mouth every 6 (six) hours as needed (nausea with chemo).), Disp: 30 tablet, Rfl: 2   Soap & Cleansers (MEDERMA AG FACIAL TONER) LIQD, Apply 1 application topically 2 (two) times daily. Advance dry skin, Disp: , Rfl:    SYMBICORT 80-4.5 MCG/ACT inhaler, INHALE 2 PUFFS BY MOUTH IN THE MORNING AND AT BEDTIME, Disp: 11 g, Rfl: 0   Tdap (BOOSTRIX) 5-2.5-18.5 LF-MCG/0.5 injection, Inject 0.5 mLs into the muscle once for 1 dose., Disp: 0.5 mL, Rfl: 0   triamcinolone ointment (KENALOG) 0.1 %, Apply 1 application topically in the morning and at bedtime., Disp: , Rfl:    diltiazem (CARDIZEM) 30 MG tablet, Take 1 tablet (30 mg total) by mouth 4 (four) times daily. (Patient not taking: Reported on 05/08/2021), Disp: 120 tablet, Rfl: 1   Allergies  Allergen Reactions    Pseudoephedrine Hypertension   Gabapentin Other (See Comments)    Raise blood pressure and red rings around eyes Blood vessels popped in her eyes   Mobic [Meloxicam] Swelling    Inflamed the area that has inflammation and stabbing pains in the area   Penicillins Hives    Reaction: Childhood   Prednisone     Caused A-fib     Review of Systems  Constitutional: Negative.   Respiratory: Negative.    Cardiovascular: Negative.   Neurological: Negative.   Psychiatric/Behavioral: Negative.  Today's Vitals   05/13/21 1139  BP: 118/72  Pulse: 88  Temp: 98.1 F (36.7 C)  Weight: 146 lb 12.8 oz (66.6 kg)   Body mass index is 24.43 kg/m.  Wt Readings from Last 3 Encounters:  05/13/21 146 lb 12.8 oz (66.6 kg)  05/08/21 142 lb 6.4 oz (64.6 kg)  05/02/21 148 lb 4.8 oz (67.3 kg)    Objective:  Physical Exam Vitals reviewed.  Constitutional:      General: She is not in acute distress.    Appearance: Normal appearance.  Cardiovascular:     Rate and Rhythm: Normal rate and regular rhythm.     Pulses: Normal pulses.     Heart sounds: Normal heart sounds. No murmur heard. Pulmonary:     Effort: Pulmonary effort is normal. No respiratory distress.     Breath sounds: Normal breath sounds. No wheezing.  Neurological:     General: No focal deficit present.     Mental Status: She is alert and oriented to person, place, and time.     Cranial Nerves: No cranial nerve deficit.     Motor: No weakness.  Psychiatric:        Mood and Affect: Mood normal.        Behavior: Behavior normal.        Thought Content: Thought content normal.        Judgment: Judgment normal.        Assessment And Plan:     1. Essential hypertension Comments: Blood pressure is well controlled, continue current medications  2. Atherosclerotic cardiovascular disease Comments: Diet controlled, will check lipid panel and encouraged to eat a low fat diet.  - Lipid panel  3. Non-small cell lung cancer,  right ([place]) Comments: Long discussion with her on what she would like to have done due to her cancer, per notes she is on chemo for maintenance, encouraged her to speak with Oncology  4. Hypokalemia Comments: Encouraged to eat potassium rich foods, will recheck potassium  - BMP8+eGFR  5. Asthma with COPD ([place]) Comments: Pulmonologist is encouraging her to not wear oxygen when possible.   6. Encounter for immunization - Tdap ([place]) 5-2.5-18.5 LF-MCG/0.5 injection; Inject 0.5 mLs into the muscle once for 1 dose.  Dispense: 0.5 mL; Refill: 0 - Tdap vaccine greater than or equal to 7yo IM     Patient was given opportunity to ask questions. Patient verbalized understanding of the plan and was able to repeat key elements of the plan. All questions were answered to their satisfaction.  Minette Brine, FNP   I, Minette Brine, FNP, have reviewed all documentation for this visit. The documentation on 05/13/21 for the exam, diagnosis, procedures, and orders are all accurate and complete.   IF YOU HAVE BEEN REFERRED TO A SPECIALIST, IT MAY TAKE 1-2 WEEKS TO SCHEDULE/PROCESS THE REFERRAL. IF YOU HAVE NOT HEARD FROM US/SPECIALIST IN TWO WEEKS, PLEASE GIVE [place] A CALL AT [phone].   THE PATIENT IS ENCOURAGED TO PRACTICE SOCIAL DISTANCING DUE TO THE COVID-19 PANDEMIC.

## 2021-05-14 ENCOUNTER — Encounter: Payer: Self-pay | Admitting: Internal Medicine

## 2021-05-14 LAB — BMP8+EGFR
BUN/Creatinine Ratio: 10 — ABNORMAL LOW (ref 12–28)
BUN: 12 mg/dL (ref 8–27)
CO2: 24 mmol/L (ref 20–29)
Calcium: 9.2 mg/dL (ref 8.7–10.3)
Chloride: 106 mmol/L (ref 96–106)
Creatinine, Ser: 1.17 mg/dL — ABNORMAL HIGH (ref 0.57–1.00)
Glucose: 76 mg/dL (ref 70–99)
Potassium: 4.1 mmol/L (ref 3.5–5.2)
Sodium: 145 mmol/L — ABNORMAL HIGH (ref 134–144)
eGFR: 53 mL/min/{1.73_m2} — ABNORMAL LOW (ref >59)

## 2021-05-14 LAB — LIPID PANEL
Chol/HDL Ratio: 2.5 ratio (ref 0.0–4.4)
Cholesterol, Total: 186 mg/dL (ref 100–199)
HDL: 73 mg/dL (ref >39)
LDL Chol Calc (NIH): 101 mg/dL — ABNORMAL HIGH (ref 0–99)
Triglycerides: 61 mg/dL (ref 0–149)
VLDL Cholesterol Cal: 12 mg/dL (ref 5–40)

## 2021-05-16 ENCOUNTER — Ambulatory Visit
Admission: RE | Admit: 2021-05-16 | Discharge: 2021-05-16 | Disposition: A | Discharge status: Home / Self Care | Payer: Medicaid Other | Source: Ambulatory Visit | Attending: Radiation Oncology | Admitting: Radiation Oncology

## 2021-05-16 ENCOUNTER — Encounter: Payer: Self-pay | Admitting: Radiation Oncology

## 2021-05-16 ENCOUNTER — Other Ambulatory Visit: Payer: Self-pay

## 2021-05-16 VITALS — BP 136/68 | HR 92 | Temp 96.9°F | Resp 18 | Ht 65.0 in | Wt 147.2 lb

## 2021-05-16 DIAGNOSIS — C3491 Malignant neoplasm of unspecified part of right bronchus or lung: Secondary | ICD-10-CM | POA: Insufficient documentation

## 2021-05-16 DIAGNOSIS — Z79899 Other long term (current) drug therapy: Secondary | ICD-10-CM | POA: Insufficient documentation

## 2021-05-16 DIAGNOSIS — C7931 Secondary malignant neoplasm of brain: Secondary | ICD-10-CM | POA: Diagnosis present

## 2021-05-16 NOTE — Progress Notes (Signed)
Radiation Oncology         [phone] ________________________________  Name: Elizabeth Mayer MRN: [id number]  Date: 05/16/2021  DOB: [birth year]  Follow-Up Visit Note  Outpatient  CC: Minette Brine, FNP  Minette Brine, FNP  Diagnosis and Prior Radiotherapy:    ICD-10-CM   1. Brain metastases [facility])  C79.31        Radiation Treatment Dates: 02/11/2021 through 02/11/2021 Site Technique Total Dose (Gy) Dose per Fx (Gy) Completed Fx Beam Energies  Brain: Brain_SRS 3D 20/20 20 1/1 [id number]    CHIEF COMPLAINT: Here for follow-up and surveillance of brain cancer   Narrative:    She presents today for physical exam and completion of H+P forms to allow sedation during MRI of brain, scheduled next week.  Neurologically she is stable.    ALLERGIES:  is allergic to pseudoephedrine, gabapentin, mobic [meloxicam], penicillins, and prednisone.  Meds: Current Outpatient Medications  Medication Sig Dispense Refill   acetaminophen (TYLENOL) 500 MG tablet Take 1,000 mg by mouth in the morning and at bedtime. Taking BID     amLODipine (NORVASC) 5 MG tablet Take 1 tablet (5 mg total) by mouth daily. 30 tablet 2   benzoyl peroxide (CERAVE ACNE FOAMING CREAM) 4 % external liquid Apply 1 application topically 2 (two) times daily. Face     Blood Pressure Monitoring (BLOOD PRESSURE MONITOR/M CUFF) MISC Please dispense 1 medium Blood pressure cuff for home monitoring 1 each 0   camphor-menthol (SARNA) lotion Apply 1 application topically daily.     Carboxymethylcellulose Sodium (REFRESH CELLUVISC OP) Place 2 drops into both eyes in the morning and at bedtime.     clindamycin (CLEOCIN T) 1 % lotion Apply 1 application topically 2 (two) times daily.     cyclobenzaprine (FLEXERIL) 10 MG tablet Take 1 tablet (10 mg total) by mouth 3 (three) times daily as needed for muscle spasms. 30 tablet 0   dexamethasone (DECADRON) 4 MG tablet Take 8 mg by mouth See admin instructions. As needed when given at chemo      diltiazem (CARDIZEM) 30 MG tablet Take 1 tablet (30 mg total) by mouth 4 (four) times daily. (Patient not taking: Reported on 05/08/2021) 120 tablet 1   fexofenadine (ALLEGRA) 180 MG tablet Take 180 mg by mouth in the morning.     fluticasone (FLONASE) 50 MCG/ACT nasal spray Place 1 spray into both nostrils daily.     folic acid (FOLVITE) 1 MG tablet Take 1 tablet by mouth once daily 90 tablet 0   guaiFENesin (MUCINEX) 600 MG 12 hr tablet Take 600 mg by mouth 2 (two) times daily.      HYDROcodone-acetaminophen (NORCO/VICODIN) 5-325 MG tablet Take 1 tablet by mouth every 6 (six) hours as needed for moderate pain. 30 tablet 0   lidocaine (XYLOCAINE) 2 % solution Use as directed 15 mLs in the mouth or throat as needed for mouth pain. Swallow 30 min prior to meals and at bedtime for throat/esophagus pain 250 mL 1   lidocaine-prilocaine (EMLA) cream Apply 1 application topically as needed. (Patient taking differently: Apply 1 application topically as needed (port access). Every three weeks) 30 g 2   Multiple Vitamin (MULTIVITAMIN WITH MINERALS) TABS tablet Take 2 tablets by mouth daily. Alive/ gummies     ondansetron (ZOFRAN) 8 MG tablet Take 1 tablet (8 mg total) by mouth every 8 (eight) hours as needed for nausea or vomiting. 20 tablet 0   OXYGEN Inhale 3 L/min into the lungs continuous.  PROAIR HFA 108 (90 Base) MCG/ACT inhaler Inhale 1-2 puffs into the lungs every 6 (six) hours as needed for wheezing or shortness of breath. 18 g 3   prochlorperazine (COMPAZINE) 10 MG tablet Take 1 tablet (10 mg total) by mouth every 6 (six) hours as needed. (Patient taking differently: Take 10 mg by mouth every 6 (six) hours as needed (nausea with chemo).) 30 tablet 2   Soap & Cleansers (MEDERMA AG FACIAL TONER) LIQD Apply 1 application topically 2 (two) times daily. Advance dry skin     SYMBICORT 80-4.5 MCG/ACT inhaler INHALE 2 PUFFS BY MOUTH IN THE MORNING AND AT BEDTIME 11 g 5   triamcinolone ointment (KENALOG) 0.1  % Apply 1 application topically in the morning and at bedtime.     No current facility-administered medications for this encounter.    Physical Findings: The patient is in no acute distress. Patient is alert and oriented.  height is 5\' 5"  (1.651 m) and weight is 147 lb 4 oz (66.8 kg). Her temporal temperature is 96.9 F (36.1 C) (abnormal). Her blood pressure is 136/68 and her pulse is 92. Her respiration is 18 and oxygen saturation is 100%. .    General: Alert and oriented, in no acute distress HEENT: Head is normocephalic. Extraocular movements are intact. Oropharynx is clear.  Breathing oxygen through nasal cannula Neck: no masses  to palpation Musculoskeletal: symmetric strength and muscle tone throughout.  Ambulatory. Heart RRR Chest CTAB Abd: NT/ND Neurologic: Cranial nerves II through XII are grossly intact. No obvious focalities. Speech is fluent. Coordination is intact. Psychiatric: Judgment and insight are intact. Affect is appropriate.    Lab Findings: Lab Results  Component Value Date   WBC 4.3 05/02/2021   HGB 10.0 (L) 05/02/2021   HCT 30.7 (L) 05/02/2021   MCV 97.8 05/02/2021   PLT 178 05/02/2021    Radiographic Findings: No results found.  Impression/Plan:   She is doing well overall following radiosurgery and will undergo a MRI of her brain next week. I filled out forms for sedation during scans.  We will call her with results.  She has followup with Dr. Mickeal Skinner and Lorenso Courier in the next 1-6 weeks as well.   I will see her back as needed and continue to review her imaging with Dr. Mickeal Skinner at tumor boards.  On date of service, in total, I spent 20 minutes on this encounter. Patient was seen in person.  _____________________________________   Eppie Gibson, MD

## 2021-05-23 ENCOUNTER — Other Ambulatory Visit: Payer: Self-pay

## 2021-05-23 ENCOUNTER — Inpatient Hospital Stay: Payer: Medicaid Other | Attending: Internal Medicine

## 2021-05-23 ENCOUNTER — Inpatient Hospital Stay (HOSPITAL_BASED_OUTPATIENT_CLINIC_OR_DEPARTMENT_OTHER): Payer: Medicaid Other | Admitting: Hematology and Oncology

## 2021-05-23 ENCOUNTER — Inpatient Hospital Stay: Payer: Medicaid Other

## 2021-05-23 ENCOUNTER — Other Ambulatory Visit: Payer: Self-pay | Admitting: Hematology and Oncology

## 2021-05-23 VITALS — BP 152/72 | HR 94 | Temp 97.1°F | Resp 19 | Ht 65.0 in | Wt 147.8 lb

## 2021-05-23 DIAGNOSIS — C3490 Malignant neoplasm of unspecified part of unspecified bronchus or lung: Secondary | ICD-10-CM | POA: Diagnosis not present

## 2021-05-23 DIAGNOSIS — C3491 Malignant neoplasm of unspecified part of right bronchus or lung: Secondary | ICD-10-CM

## 2021-05-23 DIAGNOSIS — Z95828 Presence of other vascular implants and grafts: Secondary | ICD-10-CM | POA: Diagnosis not present

## 2021-05-23 DIAGNOSIS — E876 Hypokalemia: Secondary | ICD-10-CM | POA: Diagnosis not present

## 2021-05-23 DIAGNOSIS — Z5111 Encounter for antineoplastic chemotherapy: Secondary | ICD-10-CM | POA: Insufficient documentation

## 2021-05-23 DIAGNOSIS — D6481 Anemia due to antineoplastic chemotherapy: Secondary | ICD-10-CM | POA: Diagnosis not present

## 2021-05-23 DIAGNOSIS — Z5112 Encounter for antineoplastic immunotherapy: Secondary | ICD-10-CM | POA: Diagnosis not present

## 2021-05-23 DIAGNOSIS — C7931 Secondary malignant neoplasm of brain: Secondary | ICD-10-CM | POA: Diagnosis not present

## 2021-05-23 DIAGNOSIS — Z79899 Other long term (current) drug therapy: Secondary | ICD-10-CM | POA: Insufficient documentation

## 2021-05-23 LAB — CMP (CANCER CENTER ONLY)
ALT: 9 U/L (ref 0–44)
AST: 21 U/L (ref 15–41)
Albumin: 3.7 g/dL (ref 3.5–5.0)
Alkaline Phosphatase: 40 U/L (ref 38–126)
Anion gap: 5 (ref 5–15)
BUN: 13 mg/dL (ref 8–23)
CO2: 31 mmol/L (ref 22–32)
Calcium: 9.2 mg/dL (ref 8.9–10.3)
Chloride: 106 mmol/L (ref 98–111)
Creatinine: 1.07 mg/dL — ABNORMAL HIGH (ref 0.44–1.00)
GFR, Estimated: 59 mL/min — ABNORMAL LOW (ref >60)
Glucose, Bld: 84 mg/dL (ref 70–99)
Potassium: 3.6 mmol/L (ref 3.5–5.1)
Sodium: 142 mmol/L (ref 135–145)
Total Bilirubin: 0.3 mg/dL (ref 0.3–1.2)
Total Protein: 6.6 g/dL (ref 6.5–8.1)

## 2021-05-23 LAB — CBC WITH DIFFERENTIAL (CANCER CENTER ONLY)
Abs Immature Granulocytes: 0.02 10*3/uL (ref 0.00–0.07)
Basophils Absolute: 0.1 10*3/uL (ref 0.0–0.1)
Basophils Relative: 1 %
Eosinophils Absolute: 0.1 10*3/uL (ref 0.0–0.5)
Eosinophils Relative: 3 %
HCT: 28.7 % — ABNORMAL LOW (ref 36.0–46.0)
Hemoglobin: 9.4 g/dL — ABNORMAL LOW (ref 12.0–15.0)
Immature Granulocytes: 0 %
Lymphocytes Relative: 17 %
Lymphs Abs: 0.8 10*3/uL (ref 0.7–4.0)
MCH: 31.8 pg (ref 26.0–34.0)
MCHC: 32.8 g/dL (ref 30.0–36.0)
MCV: 97 fL (ref 80.0–100.0)
Monocytes Absolute: 0.6 10*3/uL (ref 0.1–1.0)
Monocytes Relative: 13 %
Neutro Abs: 3 10*3/uL (ref 1.7–7.7)
Neutrophils Relative %: 66 %
Platelet Count: 158 10*3/uL (ref 150–400)
RBC: 2.96 MIL/uL — ABNORMAL LOW (ref 3.87–5.11)
RDW: 13.3 % (ref 11.5–15.5)
WBC Count: 4.5 10*3/uL (ref 4.0–10.5)
nRBC: 0 % (ref 0.0–0.2)

## 2021-05-23 LAB — TSH: TSH: 1.156 u[IU]/mL (ref 0.308–3.960)

## 2021-05-23 MED ORDER — PROCHLORPERAZINE MALEATE 10 MG PO TABS
10.0000 mg | ORAL_TABLET | Freq: Once | ORAL | Status: AC
  Administered 2021-05-23: 10 mg via ORAL
  Filled 2021-05-23: qty 1

## 2021-05-23 MED ORDER — SODIUM CHLORIDE 0.9% FLUSH
10.0000 mL | Freq: Once | INTRAVENOUS | Status: AC
  Administered 2021-05-23: 10 mL

## 2021-05-23 MED ORDER — SODIUM CHLORIDE 0.9 % IV SOLN
200.0000 mg | Freq: Once | INTRAVENOUS | Status: AC
  Administered 2021-05-23: 200 mg via INTRAVENOUS
  Filled 2021-05-23: qty 200

## 2021-05-23 MED ORDER — HEPARIN SOD (PORK) LOCK FLUSH 100 UNIT/ML IV SOLN
500.0000 [IU] | Freq: Once | INTRAVENOUS | Status: AC | PRN
  Administered 2021-05-23: 500 [IU]

## 2021-05-23 MED ORDER — SODIUM CHLORIDE 0.9% FLUSH
10.0000 mL | INTRAVENOUS | Status: DC | PRN
  Administered 2021-05-23: 10 mL

## 2021-05-23 MED ORDER — SODIUM CHLORIDE 0.9 % IV SOLN: Freq: Once | INTRAVENOUS | Status: AC

## 2021-05-23 MED ORDER — SODIUM CHLORIDE 0.9 % IV SOLN
500.0000 mg/m2 | Freq: Once | INTRAVENOUS | Status: AC
  Administered 2021-05-23: 900 mg via INTRAVENOUS
  Filled 2021-05-23: qty 20

## 2021-05-23 NOTE — Patient Instructions (Signed)
[facility]  Discharge Instructions: Thank you for choosing [facility] to provide your oncology and hematology care.   If you have a lab appointment with the Cancer Center, please go directly to the Cancer Center and check in at the registration area.   Wear comfortable clothing and clothing appropriate for easy access to any Portacath or PICC line.   We strive to give you quality time with your provider. You may need to reschedule your appointment if you arrive late (15 or more minutes).  Arriving late affects you and other patients whose appointments are after yours.  Also, if you miss three or more appointments without notifying the office, you may be dismissed from the clinic at the provider's discretion.      For prescription refill requests, have your pharmacy contact our office and allow 72 hours for refills to be completed.    Today you received the following chemotherapy and/or immunotherapy agents Pembrolizumab (Keytruda) and Pemetrexed (Alimta)      To help prevent nausea and vomiting after your treatment, we encourage you to take your nausea medication as directed.  BELOW ARE SYMPTOMS THAT SHOULD BE REPORTED IMMEDIATELY: *FEVER GREATER THAN 100.4 F (38 C) OR HIGHER *CHILLS OR SWEATING *NAUSEA AND VOMITING THAT IS NOT CONTROLLED WITH YOUR NAUSEA MEDICATION *UNUSUAL SHORTNESS OF BREATH *UNUSUAL BRUISING OR BLEEDING *URINARY PROBLEMS (pain or burning when urinating, or frequent urination) *BOWEL PROBLEMS (unusual diarrhea, constipation, pain near the anus) TENDERNESS IN MOUTH AND THROAT WITH OR WITHOUT PRESENCE OF ULCERS (sore throat, sores in mouth, or a toothache) UNUSUAL RASH, SWELLING OR PAIN  UNUSUAL VAGINAL DISCHARGE OR ITCHING   Items with * indicate a potential emergency and should be followed up as soon as possible or go to the Emergency Department if any problems should occur.  Please show the CHEMOTHERAPY ALERT CARD or  IMMUNOTHERAPY ALERT CARD at check-in to the Emergency Department and triage nurse.  Should you have questions after your visit or need to cancel or reschedule your appointment, please contact [facility]  Dept: [phone]  and follow the prompts.  Office hours are 8:00 a.m. to 4:30 p.m. Monday - Friday. Please note that voicemails left after 4:00 p.m. may not be returned until the following business day.  We are closed weekends and major holidays. You have access to a nurse at all times for urgent questions. Please call the main number to the clinic Dept: [phone] and follow the prompts.   For any non-urgent questions, you may also contact your provider using MyChart. We now offer e-Visits for anyone 18 and older to request care online for non-urgent symptoms. For details visit [email or web address].   Also download the MyChart app! Go to the app store, search "MyChart", open the app, select [facility], and log in with your MyChart username and password.  Due to Covid, a mask is required upon entering the hospital/clinic. If you do not have a mask, one will be given to you upon arrival. For doctor visits, patients may have 1 support person aged 18 or older with them. For treatment visits, patients cannot have anyone with them due to current Covid guidelines and our immunocompromised population.   

## 2021-05-23 NOTE — Progress Notes (Signed)
[facility] [phone]   [phone]  PROGRESS NOTE  Patient Care Team: Minette Brine, FNP as PCP - General (General Practice)  Hematological/Oncological History # Metastatic Adenocarcinoma of the Lung #Brain Metastasis 1) 11/08/2019: establish care with Dr. Julien Nordmann and his PA Cassie Heilingoetter. Noted to have widely metastatic lung cancer with involvement of the brain, lymph nodes, and right lung.  2) 12/04/2019: palliative radiation to the right lung mass/cervical adenopathy 3) 9/7/02021: started therapy with Carbo/Pem/Pem 4) 9/29-10/09/2019: patient underwent SRS to the brain mets 5) 03/22/2020: repeat CT C/A/P and neck scheduled. Transfer care to Dr. Lorenso Courier.   6) 03/22/2020: CT C/A/P showed interval significant improvement in previously demonstrated extensive confluent lymphadenopathy in the superior mediastinum and right hilar regions. 7) 08/15/2020: Holding chemotherapy due to poor Hgb and fatigue. Allowing patient a brief 'chemo holiday' to rebound appropriately from last cycle.  8) 08/29/2020: restarted chemotherapy, Cycle 12 Day 1 9) 09/19/2020: patient requested to HOLD Cycle 13 in setting of fatigue/weakness.  10) 11/29/2020: Cycle 14 Day 1 of chemotherapy, delayed start due to insurance issues.   Interval History:  Elizabeth Mayer 61 y.o. female with medical history significant for metastatic adenocarcinoma of the lung who presents for follow up. The patient's last visit was on 05/02/2021 when she received Cycle 21, Day 1 of pem/pem maintenance.  In the interim since her last visit she has had no other changed in her health.   On exam today Elizabeth Mayer reports he is currently being evaluated by cardiologist for palpitations and possible atrial fibrillation that she is having after chemotherapy treatments.  She notes that it typically occurs within the first week of her treatment.  She notes that she feels like her heart is racing and that it causes  her some fatigue.  On occasion she does have dizziness with this.  Otherwise she has been well in the interim since her last visit.  She is not having any vomiting or diarrhea but does occasionally have some bouts of nausea which responded well to her as needed medications.  She notes that her appetite comes and goes and she does not have much desire to eat.  She notes that she denies any fevers, chills, night sweats, chest pain or cough. She has no other complaints.  A full 10 point ROS is listed below.  MEDICAL HISTORY:  Past Medical History:  Diagnosis Date   Anemia    Angina 1982   related to stress   Asthma    in the past,  no current problems   Complication of anesthesia    states anesthesia made her hair fall out   Constipation    Dyspnea    on oxygen at home - 2L via [place]   Fatigue    GERD (gastroesophageal reflux disease)    patient denies this dx   Headache    Heart murmur 1970s   no problems currently   Ingrown toenail    Lung cancer ([place]) 10/2019   metastatic disease to the brain   On home oxygen therapy    2.2 lpm, 24 hours a day   Past heart attack 1980-1981   pt states she passed out and woke up in hospital- told she had heart attack, but then dr said he couldn't find anything wrong.   Pneumonia    x 1    SURGICAL HISTORY: Past Surgical History:  Procedure Laterality Date   CERVICAL DISC SURGERY  2000   Disc removed from neck  ingrown toe nail surgery Bilateral    IR IMAGING GUIDED PORT INSERTION  02/16/2020   MULTIPLE TOOTH EXTRACTIONS     for braces   RADIOLOGY WITH ANESTHESIA N/A 12/05/2019   Procedure: MRI BRAIN WITH AND WITHOUT CONTRAST;  Surgeon: Radiologist, Medication, MD;  Location: [facility];  Service: Radiology;  Laterality: N/A;   RADIOLOGY WITH ANESTHESIA N/A 01/02/2020   Procedure: MRI BRAIN WITH AND WITHOUT CONTRAST;  Surgeon: Radiologist, Medication, MD;  Location: [facility];  Service: Radiology;  Laterality: N/A;   RADIOLOGY WITH ANESTHESIA N/A  02/20/2020   Procedure: MRI WITH ANESTHESIA OF BRAIN WITH AND WITHOUT CONTRAST;  Surgeon: Radiologist, Medication, MD;  Location: [facility];  Service: Radiology;  Laterality: N/A;   RADIOLOGY WITH ANESTHESIA N/A 06/11/2020   Procedure: MRI WITH ANESTHESIA OF BRAIN WITH AND WITHOUT CONTRAST;  Surgeon: Radiologist, Medication, MD;  Location: [facility];  Service: Radiology;  Laterality: N/A;   RADIOLOGY WITH ANESTHESIA N/A 10/10/2020   Procedure: MRI WITH ANESTHESIA BRAIN WITH AND WITHOUT CONTRAST;  Surgeon: Radiologist, Medication, MD;  Location: [facility];  Service: Radiology;  Laterality: N/A;   RADIOLOGY WITH ANESTHESIA N/A 01/30/2021   Procedure: MRI BRAIN WITH AND WITHOUT CONTRASTWITH ANESTHESIA;  Surgeon: Radiologist, Medication, MD;  Location: [facility];  Service: Radiology;  Laterality: N/A;   TONSILLECTOMY      SOCIAL HISTORY: Social History   Socioeconomic History   Marital status: Legally Separated    Spouse name: Not on file   Number of children: 0   Years of education: Not on file   Highest education level: Not on file  Occupational History   Occupation: Secondary school teacher  Tobacco Use   Smoking status: Former    Packs/day: 1.00    Years: 20.00    Pack years: 20.00    Types: Cigarettes    Start date: 71    Quit date: 2001    Years since quitting: 22.1   Smokeless tobacco: Never  Vaping Use   Vaping Use: Never used  Substance and Sexual Activity   Alcohol use: No   Drug use: No   Sexual activity: Yes    Birth control/protection: None, Post-menopausal  Other Topics Concern   Not on file  Social History Narrative   Not on file   Social Determinants of Health   Financial Resource Strain: Not on file  Food Insecurity: Not on file  Transportation Needs: Not on file  Physical Activity: Not on file  Stress: Not on file  Social Connections: Not on file  Intimate Partner Violence: Not on file    FAMILY HISTORY: Family History  Problem Relation Age of Onset   Arthritis Mother     Heart failure Father     ALLERGIES:  is allergic to pseudoephedrine, gabapentin, mobic [meloxicam], penicillins, and prednisone.  MEDICATIONS:  Current Outpatient Medications  Medication Sig Dispense Refill   acetaminophen (TYLENOL) 500 MG tablet Take 1,000 mg by mouth in the morning and at bedtime. Taking BID     amLODipine (NORVASC) 5 MG tablet Take 1 tablet (5 mg total) by mouth daily. 30 tablet 2   benzoyl peroxide (CERAVE ACNE FOAMING CREAM) 4 % external liquid Apply 1 application topically 2 (two) times daily. Face     Blood Pressure Monitoring (BLOOD PRESSURE MONITOR/M CUFF) MISC Please dispense 1 medium Blood pressure cuff for home monitoring 1 each 0   camphor-menthol (SARNA) lotion Apply 1 application topically daily.     Carboxymethylcellulose Sodium (REFRESH CELLUVISC OP) Place 2 drops into  both eyes in the morning and at bedtime.     clindamycin (CLEOCIN T) 1 % lotion Apply 1 application topically 2 (two) times daily.     cyclobenzaprine (FLEXERIL) 10 MG tablet Take 1 tablet (10 mg total) by mouth 3 (three) times daily as needed for muscle spasms. 30 tablet 0   dexamethasone (DECADRON) 4 MG tablet Take 8 mg by mouth See admin instructions. As needed when given at chemo     diltiazem (CARDIZEM) 30 MG tablet Take 1 tablet (30 mg total) by mouth 4 (four) times daily. (Patient not taking: Reported on 05/08/2021) 120 tablet 1   fexofenadine (ALLEGRA) 180 MG tablet Take 180 mg by mouth in the morning.     fluticasone (FLONASE) 50 MCG/ACT nasal spray Place 1 spray into both nostrils daily.     folic acid (FOLVITE) 1 MG tablet Take 1 tablet by mouth once daily 90 tablet 0   guaiFENesin (MUCINEX) 600 MG 12 hr tablet Take 600 mg by mouth 2 (two) times daily.      HYDROcodone-acetaminophen (NORCO/VICODIN) 5-325 MG tablet Take 1 tablet by mouth every 6 (six) hours as needed for moderate pain. 30 tablet 0   lidocaine (XYLOCAINE) 2 % solution Use as directed 15 mLs in the mouth or throat as  needed for mouth pain. Swallow 30 min prior to meals and at bedtime for throat/esophagus pain 250 mL 1   lidocaine-prilocaine (EMLA) cream Apply 1 application topically as needed. (Patient taking differently: Apply 1 application topically as needed (port access). Every three weeks) 30 g 2   Multiple Vitamin (MULTIVITAMIN WITH MINERALS) TABS tablet Take 2 tablets by mouth daily. Alive/ gummies     ondansetron (ZOFRAN) 8 MG tablet Take 1 tablet (8 mg total) by mouth every 8 (eight) hours as needed for nausea or vomiting. 20 tablet 0   OXYGEN Inhale 3 L/min into the lungs continuous.     PROAIR HFA 108 (90 Base) MCG/ACT inhaler Inhale 1-2 puffs into the lungs every 6 (six) hours as needed for wheezing or shortness of breath. 18 g 3   prochlorperazine (COMPAZINE) 10 MG tablet Take 1 tablet (10 mg total) by mouth every 6 (six) hours as needed. (Patient taking differently: Take 10 mg by mouth every 6 (six) hours as needed (nausea with chemo).) 30 tablet 2   Soap & Cleansers (MEDERMA AG FACIAL TONER) LIQD Apply 1 application topically 2 (two) times daily. Advance dry skin     SYMBICORT 80-4.5 MCG/ACT inhaler INHALE 2 PUFFS BY MOUTH IN THE MORNING AND AT BEDTIME 11 g 5   triamcinolone ointment (KENALOG) 0.1 % Apply 1 application topically in the morning and at bedtime.     No current facility-administered medications for this visit.    REVIEW OF SYSTEMS:   Constitutional: ( - ) fevers, ( - )  chills , ( - ) night sweats Eyes: ( - ) blurriness of vision, ( - ) double vision, ( - ) watery eyes Ears, nose, mouth, throat, and face: ( - ) mucositis, ( - ) sore throat Respiratory: ( - ) cough, ( - ) dyspnea, ( - ) wheezes Cardiovascular: ( + ) palpitation, ( - ) chest discomfort, ( - ) lower extremity swelling Gastrointestinal:  ( - ) nausea, ( - ) heartburn, ( - ) change in bowel habits Skin: ( - ) abnormal skin rashes Lymphatics: ( - ) new lymphadenopathy, ( - ) easy bruising Neurological: ( - ) numbness,  ( - ) tingling, ( - )  new weaknesses Behavioral/Psych: ( - ) mood change, ( - ) new changes  All other systems were reviewed with the patient and are negative.  PHYSICAL EXAMINATION: ECOG PERFORMANCE STATUS: 2 - Symptomatic, <50% confined to bed  Vitals:   05/23/21 1111  BP: (!) 152/72  Pulse: 94  Resp: 19  Temp: (!) 97.1 F (36.2 C)  SpO2: 96%     Filed Weights   05/23/21 1111  Weight: 147 lb 12.8 oz (67 kg)     GENERAL: well appearing middle aged [place] American female. alert, no distress and comfortable SKIN: skin color, texture, turgor are normal, no rashes or significant lesions EYES: conjunctiva are pink and non-injected, sclera clear LUNGS: clear to auscultation and percussion with normal breathing effort HEART: regular rate & rhythm and no murmurs and no lower extremity edema Musculoskeletal: no cyanosis of digits and no clubbing.  PSYCH: alert & oriented x 3, fluent speech NEURO: no focal motor/sensory deficits   LABORATORY DATA:  I have reviewed the data as listed CBC Latest Ref Rng & Units 05/23/2021 05/02/2021 04/11/2021  WBC 4.0 - 10.5 K/uL 4.5 4.3 6.7  Hemoglobin 12.0 - 15.0 g/dL 9.4(L) 10.0(L) 10.2(L)  Hematocrit 36.0 - 46.0 % 28.7(L) 30.7(L) 31.9(L)  Platelets 150 - 400 K/uL 158 178 195    CMP Latest Ref Rng & Units 05/23/2021 05/13/2021 05/02/2021  Glucose 70 - 99 mg/dL 84 76 126(H)  BUN 8 - 23 mg/dL 13 12 12   Creatinine 0.44 - 1.00 mg/dL 1.07(H) 1.17(H) 1.10(H)  Sodium 135 - 145 mmol/L 142 145(H) 141  Potassium 3.5 - 5.1 mmol/L 3.6 4.1 3.4(L)  Chloride 98 - 111 mmol/L 106 106 106  CO2 22 - 32 mmol/L 31 24 29   Calcium 8.9 - 10.3 mg/dL 9.2 9.2 9.3  Total Protein 6.5 - 8.1 g/dL 6.6 - 7.1  Total Bilirubin 0.3 - 1.2 mg/dL 0.3 - 0.4  Alkaline Phos 38 - 126 U/L 40 - 42  AST 15 - 41 U/L 21 - 23  ALT 0 - 44 U/L 9 - 10    RADIOGRAPHIC STUDIES: No results found.   ASSESSMENT & PLAN Elizabeth Mayer 61 y.o. female with medical history significant for  metastatic adenocarcinoma of the lung who presents for follow up.    After review the labs, the records, discussion with the patient the findings are most consistent with metastatic adenocarcinoma the lung without any targetable mutations.  The patient has been started on carboplatin pemetrexed pembrolizumab every 3 weeks with Dr. Julien Nordmann, however the patient has expressed a desire to transition to another provider.  As such we were happy to take up her care here.  She already discontinued the carboplatin and is on maintenance pemetrexed/pembrolizumab therapy. We assumed her care with Cycle 6 on 04/04/2020.  # Metastatic Adenocarcinoma of the Lung #Brain Metastasis  --no targetable mutations noted on prior workup. --continue to follow with Dr. Laqueta Due Onc for metastatic spread to the brain.  --monitor CMP, CBC, and TSH with each treatment --can consider docetaxel/ramucirumab at time of progression.  Plan: --CT scan shows stable disease in Jan 2023. Next scan in April 2023.  --today is Cycle 22 Day 1 of maintenance Pem/Pem --Labs today were reviewed and adequate for treatment today.  --RTC in 3 weeks for Cycle 23 of chemotherapy  #Chemotherapy Induced Anemia --Hgb 9.4, MCV 97.0 today, stable from prior --continue to monitor   # Fatigue: --Uncertain etiology but likely chemotherapy/immunotherapy side effects  --Monitor closely  # Mid back pain: --  no findings of metastasis to spine or structural problems with back on last imaging.    #hypokalemia: --potassium level is 3.6 --prescribed 20 mEq of oral potassium chloride and encouraged to eat potassium rich foods.   #Memory changes/vision changes/dizziness --Memory loss present for the last several weeks, mainly with word recognition and difficulty remembering daily tasks.  --Floaters in lower visual field of left eye, occasional.  --Dizziness is more frequent but shorter in duration.  --She is due for repeat MRI scan of the brain and  follow up with rad onc, awaiting appts.  No orders of the defined types were placed in this encounter.   All questions were answered. The patient knows to call the clinic with any problems, questions or concerns.  I have spent a total of 30 minutes minutes of face-to-face and non-face-to-face time, preparing to see the patient, obtaining and/or reviewing separately obtained history, performing a medically appropriate examination, counseling and educating the patient, ordering tests, documenting clinical information in the electronic health record and care coordination.   Ledell Peoples, MD Department of Hematology/Oncology [place] [facility] Phone: [phone] Pager: [phone] Email: [email or web address]  05/27/2021 10:52 AM

## 2021-05-26 ENCOUNTER — Telehealth: Payer: Self-pay | Admitting: Hematology and Oncology

## 2021-05-26 NOTE — Telephone Encounter (Signed)
Scheduled per 2/17 los, pt has been called and confirmed

## 2021-05-27 ENCOUNTER — Ambulatory Visit (HOSPITAL_COMMUNITY)
Admission: RE | Admit: 2021-05-27 | Discharge: 2021-05-27 | Disposition: A | Discharge status: Home / Self Care | Payer: Medicaid Other | Source: Ambulatory Visit | Attending: Nurse Practitioner | Admitting: Nurse Practitioner

## 2021-05-27 ENCOUNTER — Encounter (HOSPITAL_COMMUNITY): Payer: Self-pay | Admitting: Nurse Practitioner

## 2021-05-27 ENCOUNTER — Other Ambulatory Visit: Payer: Self-pay

## 2021-05-27 ENCOUNTER — Telehealth: Payer: Self-pay | Admitting: *Deleted

## 2021-05-27 ENCOUNTER — Encounter: Payer: Self-pay | Admitting: Physician Assistant

## 2021-05-27 ENCOUNTER — Encounter: Payer: Self-pay | Admitting: Internal Medicine

## 2021-05-27 VITALS — BP 134/66 | HR 96 | Ht 65.0 in | Wt 141.4 lb

## 2021-05-27 DIAGNOSIS — C7800 Secondary malignant neoplasm of unspecified lung: Secondary | ICD-10-CM | POA: Diagnosis not present

## 2021-05-27 DIAGNOSIS — I4892 Unspecified atrial flutter: Secondary | ICD-10-CM | POA: Insufficient documentation

## 2021-05-27 DIAGNOSIS — I480 Paroxysmal atrial fibrillation: Secondary | ICD-10-CM | POA: Insufficient documentation

## 2021-05-27 DIAGNOSIS — D6869 Other thrombophilia: Secondary | ICD-10-CM | POA: Diagnosis not present

## 2021-05-27 DIAGNOSIS — J9611 Chronic respiratory failure with hypoxia: Secondary | ICD-10-CM | POA: Insufficient documentation

## 2021-05-27 NOTE — Progress Notes (Signed)
Primary Care Physician: Minette Brine, FNP Referring Physician: ED f/u  Oncologist: Dr. Brantley Stage is a 61 y.o. female with a h/o  non-small cell lung cancer, chronic hypoxic respiratory failure on oxygen who presented to ED with palpitations.  Patient reported that she woke up this morning around 3 AM with palpitations.  She was scheduled to have a CT scan later this morning and has been taking prednisone for pretreatment. She took 1 at 9 PM and woke up this morning at 3 AM to take her prednisone.  At that time she had onset of heart racing.  No chest pain or shortness of breath. EKG showed probable typical atrail flutter at 138 bpm.   She has a history of atrial fibrillation remotely but is not currently undergoing any treatment and does not need blood thinners. She did convert in the ER with Cardizem and with a CHA2DS2VASc of 1, anticoagulation was not started. In the afib clinic today, she is in [place] and has not noted any further afib.   F/u in afib clinic, 05/08/21. She is in SR now, but went into afib after chemo tx on Friday. HR averaged in the 90's. She feels she went into SR just a few hours earlier. Her first  episode was probably triggered by prednisone use. She did use her 30 mg Cardizem during episode.   F/u in afib clinic, 05/27/21. She  is in SR but again feels that she went into afib after her chemo on Friday and went back into SR this am. She is using the Cardizem 30 mg as needed for afib which she states slows it down and it does not to seem to bother her as much. She states that she is not tolerating her chemo well.   Today, she denies symptoms of palpitations, chest pain, shortness of breath, orthopnea, PND, lower extremity edema, dizziness, presyncope, syncope, or neurologic sequela. The patient is tolerating medications without difficulties and is otherwise without complaint today.   Past Medical History:  Diagnosis Date   Anemia    Angina 1982   related to stress    Asthma    in the past,  no current problems   Complication of anesthesia    states anesthesia made her hair fall out   Constipation    Dyspnea    on oxygen at home - 2L via    Fatigue    GERD (gastroesophageal reflux disease)    patient denies this dx   Headache    Heart murmur 1970s   no problems currently   Ingrown toenail    Lung cancer ([place]) 10/2019   metastatic disease to the brain   On home oxygen therapy    2.2 lpm, 24 hours a day   Past heart attack 1980-1981   pt states she passed out and woke up in hospital- told she had heart attack, but then dr said he couldn't find anything wrong.   Pneumonia    x 1   Past Surgical History:  Procedure Laterality Date   CERVICAL DISC SURGERY  2000   Disc removed from neck    ingrown toe nail surgery Bilateral    IR IMAGING GUIDED PORT INSERTION  02/16/2020   MULTIPLE TOOTH EXTRACTIONS     for braces   RADIOLOGY WITH ANESTHESIA N/A 12/05/2019   Procedure: MRI BRAIN WITH AND WITHOUT CONTRAST;  Surgeon: Radiologist, Medication, MD;  Location: [facility];  Service: Radiology;  Laterality: N/A;  RADIOLOGY WITH ANESTHESIA N/A 01/02/2020   Procedure: MRI BRAIN WITH AND WITHOUT CONTRAST;  Surgeon: Radiologist, Medication, MD;  Location: [facility];  Service: Radiology;  Laterality: N/A;   RADIOLOGY WITH ANESTHESIA N/A 02/20/2020   Procedure: MRI WITH ANESTHESIA OF BRAIN WITH AND WITHOUT CONTRAST;  Surgeon: Radiologist, Medication, MD;  Location: [facility];  Service: Radiology;  Laterality: N/A;   RADIOLOGY WITH ANESTHESIA N/A 06/11/2020   Procedure: MRI WITH ANESTHESIA OF BRAIN WITH AND WITHOUT CONTRAST;  Surgeon: Radiologist, Medication, MD;  Location: [facility];  Service: Radiology;  Laterality: N/A;   RADIOLOGY WITH ANESTHESIA N/A 10/10/2020   Procedure: MRI WITH ANESTHESIA BRAIN WITH AND WITHOUT CONTRAST;  Surgeon: Radiologist, Medication, MD;  Location: [facility];  Service: Radiology;  Laterality: N/A;   RADIOLOGY WITH ANESTHESIA N/A 01/30/2021    Procedure: MRI BRAIN WITH AND WITHOUT CONTRASTWITH ANESTHESIA;  Surgeon: Radiologist, Medication, MD;  Location: [facility];  Service: Radiology;  Laterality: N/A;   TONSILLECTOMY      Current Outpatient Medications  Medication Sig Dispense Refill   acetaminophen (TYLENOL) 500 MG tablet Take 1,000 mg by mouth in the morning and at bedtime. Taking BID     amLODipine (NORVASC) 5 MG tablet Take 1 tablet (5 mg total) by mouth daily. 30 tablet 2   benzoyl peroxide (CERAVE ACNE FOAMING CREAM) 4 % external liquid Apply 1 application topically 2 (two) times daily. Face     Blood Pressure Monitoring (BLOOD PRESSURE MONITOR/M CUFF) MISC Please dispense 1 medium Blood pressure cuff for home monitoring 1 each 0   camphor-menthol (SARNA) lotion Apply 1 application topically daily.     Carboxymethylcellulose Sodium (REFRESH CELLUVISC OP) Place 2 drops into both eyes in the morning and at bedtime.     clindamycin (CLEOCIN T) 1 % lotion Apply 1 application topically 2 (two) times daily.     cyclobenzaprine (FLEXERIL) 10 MG tablet Take 1 tablet (10 mg total) by mouth 3 (three) times daily as needed for muscle spasms. 30 tablet 0   dexamethasone (DECADRON) 4 MG tablet Take 8 mg by mouth See admin instructions. As needed when given at chemo     fexofenadine (ALLEGRA) 180 MG tablet Take 180 mg by mouth in the morning.     fluticasone (FLONASE) 50 MCG/ACT nasal spray Place 1 spray into both nostrils daily.     folic acid (FOLVITE) 1 MG tablet Take 1 tablet by mouth once daily 90 tablet 0   guaiFENesin (MUCINEX) 600 MG 12 hr tablet Take 600 mg by mouth 2 (two) times daily.      HYDROcodone-acetaminophen (NORCO/VICODIN) 5-325 MG tablet Take 1 tablet by mouth every 6 (six) hours as needed for moderate pain. 30 tablet 0   lidocaine (XYLOCAINE) 2 % solution Use as directed 15 mLs in the mouth or throat as needed for mouth pain. Swallow 30 min prior to meals and at bedtime for throat/esophagus pain 250 mL 1    lidocaine-prilocaine (EMLA) cream Apply 1 application topically as needed. (Patient taking differently: Apply 1 application topically as needed (port access). Every three weeks) 30 g 2   Multiple Vitamin (MULTIVITAMIN WITH MINERALS) TABS tablet Take 2 tablets by mouth daily. Alive/ gummies     ondansetron (ZOFRAN) 8 MG tablet Take 1 tablet (8 mg total) by mouth every 8 (eight) hours as needed for nausea or vomiting. 20 tablet 0   OXYGEN Inhale 3 L/min into the lungs continuous.     PROAIR HFA 108 (90 Base) MCG/ACT inhaler Inhale 1-2  puffs into the lungs every 6 (six) hours as needed for wheezing or shortness of breath. 18 g 3   prochlorperazine (COMPAZINE) 10 MG tablet Take 1 tablet (10 mg total) by mouth every 6 (six) hours as needed. (Patient taking differently: Take 10 mg by mouth every 6 (six) hours as needed (nausea with chemo).) 30 tablet 2   Soap & Cleansers (MEDERMA AG FACIAL TONER) LIQD Apply 1 application topically 2 (two) times daily. Advance dry skin     SYMBICORT 80-4.5 MCG/ACT inhaler INHALE 2 PUFFS BY MOUTH IN THE MORNING AND AT BEDTIME 11 g 5   triamcinolone ointment (KENALOG) 0.1 % Apply 1 application topically in the morning and at bedtime.     diltiazem (CARDIZEM) 30 MG tablet Take 1 tablet (30 mg total) by mouth 4 (four) times daily. (Patient not taking: Reported on 05/08/2021) 120 tablet 1   No current facility-administered medications for this encounter.    Allergies  Allergen Reactions   Pseudoephedrine Hypertension   Gabapentin Other (See Comments)    Raise blood pressure and red rings around eyes Blood vessels popped in her eyes   Mobic [Meloxicam] Swelling    Inflamed the area that has inflammation and stabbing pains in the area   Penicillins Hives    Reaction: Childhood   Prednisone     Caused A-fib    Social History   Socioeconomic History   Marital status: Legally Separated    Spouse name: Not on file   Number of children: 0   Years of education: Not on  file   Highest education level: Not on file  Occupational History   Occupation: Secondary school teacher  Tobacco Use   Smoking status: Former    Packs/day: 1.00    Years: 20.00    Pack years: 20.00    Types: Cigarettes    Start date: 47    Quit date: 2001    Years since quitting: 22.1   Smokeless tobacco: Never  Vaping Use   Vaping Use: Never used  Substance and Sexual Activity   Alcohol use: No   Drug use: No   Sexual activity: Yes    Birth control/protection: None, Post-menopausal  Other Topics Concern   Not on file  Social History Narrative   Not on file   Social Determinants of Health   Financial Resource Strain: Not on file  Food Insecurity: Not on file  Transportation Needs: Not on file  Physical Activity: Not on file  Stress: Not on file  Social Connections: Not on file  Intimate Partner Violence: Not on file    Family History  Problem Relation Age of Onset   Arthritis Mother    Heart failure Father     ROS- All systems are reviewed and negative except as per the HPI above  Physical Exam: Vitals:   05/27/21 1501  BP: 134/66  Pulse: 96  Weight: 64.1 kg  Height: 5\' 5"  (1.651 m)   Wt Readings from Last 3 Encounters:  05/27/21 64.1 kg  05/23/21 67 kg  05/16/21 66.8 kg    Labs: Lab Results  Component Value Date   NA 142 05/23/2021   K 3.6 05/23/2021   CL 106 05/23/2021   CO2 31 05/23/2021   GLUCOSE 84 05/23/2021   BUN 13 05/23/2021   CREATININE 1.07 (H) 05/23/2021   CALCIUM 9.2 05/23/2021   MG 1.8 04/11/2021   Lab Results  Component Value Date   INR 0.9 02/16/2020   Lab Results  Component Value  Date   CHOL 186 05/13/2021   HDL 73 05/13/2021   LDLCALC 101 (H) 05/13/2021   TRIG 61 05/13/2021     GEN- The patient is well appearing, alert and oriented x 3 today.   Head- normocephalic, atraumatic Eyes-  Sclera clear, conjunctiva pink Ears- hearing intact Oropharynx- clear Neck- supple, no JVP Lymph- no cervical lymphadenopathy Lungs-  Clear to ausculation bilaterally, normal work of breathing Heart- Regular rate and rhythm, no murmurs, rubs or gallops, PMI not laterally displaced GI- soft, NT, ND, + BS Extremities- no clubbing, cyanosis, or edema MS- no significant deformity or atrophy Skin- no rash or lesion Psych- euthymic mood, full affect Neuro- strength and sensation are intact  EKG-NSR at 96 bpm, pr int 118 ms, qrs int 62 ms, qtc  507 ms      Assessment and Plan:  1. Atrial  flutter Possibly induced by prednisone dose Recent episode that came on with chemo therapy.  States it happened again last Friday with last chemo treatment Now back in SR Will place a 2 week zio patch to determine afib burden and see if antiarrythmic's may be needed  She has 30 mg Cardizem to use  if needed, for breakthrough afib   2. CHA2DS2VASc  score of 1 No need for anticoagulation per guidelines   F/u with afib clinic  in 6 weeks   Butch Penny C. Stephany Poorman, [facility] [address] [place], [place] [address] [phone]

## 2021-05-27 NOTE — Telephone Encounter (Signed)
Received vm from pt stating that she had chemotherapy on Friday & notes that she has a dry spot on her R Labia that she noticed after some discomfort in the area.  She also reports low grade fever, chills, nausea, & vomited a little yesterday & took zofran & has that under control.  She also states that her A-fib is active.  Returned call to pt & she reports fever has been around 100 degrees.  She has an appt with Cardiologist today.  She also report h/a & feeling wiped out.  She has an appt with GYN 4/11.  Encouraged her to call them for sooner appt.  She denies any drainage.  Discussed with Dr Lorenso Courier & he agrees with assessment & probably not related to treatment on Friday.

## 2021-05-28 ENCOUNTER — Encounter: Payer: Self-pay | Admitting: Internal Medicine

## 2021-05-28 ENCOUNTER — Encounter: Payer: Self-pay | Admitting: Physician Assistant

## 2021-06-10 ENCOUNTER — Other Ambulatory Visit: Payer: Self-pay

## 2021-06-10 ENCOUNTER — Encounter (HOSPITAL_COMMUNITY): Payer: Self-pay | Admitting: *Deleted

## 2021-06-10 ENCOUNTER — Other Ambulatory Visit: Payer: Self-pay | Admitting: Hematology and Oncology

## 2021-06-10 NOTE — Progress Notes (Signed)
PCP - Minette Brine, FNP ?Cardiologist - Roderic Palau, NP ?Oncology - Dr Lorenso Courier ?Pulmonology - Dr Baltazar Apo ? ?CT Chest x-ray - 04/14/21 ?EKG - 05/27/21 ?Stress Test - n/a ?ECHO - n/a ?Cardiac Cath - n/a ? ?ICD Pacemaker/Loop - n/a ? ?Sleep Study -  n/a ?CPAP - none ? ?Anesthesia review: Yes ? ?STOP now taking any Aspirin (unless otherwise instructed by your surgeon), Aleve, Naproxen, Ibuprofen, Motrin, Advil, Goody's, BC's, all herbal medications, fish oil, and all vitamins.  ? ?Coronavirus Screening ?Covid test n/a Ambulatory Surgery  ?Do you have any of the following symptoms:  ?Cough yes/no: No ?Fever (>100.53F)  yes/no: No ?Runny nose yes/no: No ?Sore throat yes/no: No ?Difficulty breathing/shortness of breath  Yes ? ?Have you traveled in the last 14 days and where? yes/no: No ? ?Patient verbalized understanding of instructions that were given via phone. ?

## 2021-06-11 ENCOUNTER — Other Ambulatory Visit: Payer: Self-pay | Admitting: Oncology

## 2021-06-12 ENCOUNTER — Ambulatory Visit (HOSPITAL_BASED_OUTPATIENT_CLINIC_OR_DEPARTMENT_OTHER): Payer: Medicaid Other | Admitting: Physician Assistant

## 2021-06-12 ENCOUNTER — Encounter (HOSPITAL_COMMUNITY)
Admission: RE | Disposition: A | Discharge status: Home / Self Care | Payer: Self-pay | Source: Home / Self Care | Attending: Radiation Oncology

## 2021-06-12 ENCOUNTER — Ambulatory Visit (HOSPITAL_COMMUNITY): Payer: Medicaid Other | Admitting: Physician Assistant

## 2021-06-12 ENCOUNTER — Encounter (HOSPITAL_COMMUNITY): Payer: Self-pay | Admitting: Radiation Oncology

## 2021-06-12 ENCOUNTER — Ambulatory Visit (HOSPITAL_COMMUNITY)
Admission: RE | Admit: 2021-06-12 | Discharge: 2021-06-12 | Disposition: A | Discharge status: Home / Self Care | Payer: Medicaid Other | Source: Ambulatory Visit | Attending: Radiation Oncology | Admitting: Radiation Oncology

## 2021-06-12 ENCOUNTER — Other Ambulatory Visit: Payer: Self-pay

## 2021-06-12 ENCOUNTER — Ambulatory Visit (HOSPITAL_COMMUNITY)
Admission: RE | Admit: 2021-06-12 | Discharge: 2021-06-12 | Disposition: A | Discharge status: Home / Self Care | Payer: Medicaid Other | Attending: Radiation Oncology | Admitting: Radiation Oncology

## 2021-06-12 DIAGNOSIS — K219 Gastro-esophageal reflux disease without esophagitis: Secondary | ICD-10-CM | POA: Diagnosis not present

## 2021-06-12 DIAGNOSIS — Z981 Arthrodesis status: Secondary | ICD-10-CM | POA: Diagnosis not present

## 2021-06-12 DIAGNOSIS — Z87891 Personal history of nicotine dependence: Secondary | ICD-10-CM | POA: Insufficient documentation

## 2021-06-12 DIAGNOSIS — C800 Disseminated malignant neoplasm, unspecified: Secondary | ICD-10-CM

## 2021-06-12 DIAGNOSIS — J449 Chronic obstructive pulmonary disease, unspecified: Secondary | ICD-10-CM | POA: Insufficient documentation

## 2021-06-12 DIAGNOSIS — Z9981 Dependence on supplemental oxygen: Secondary | ICD-10-CM | POA: Diagnosis not present

## 2021-06-12 DIAGNOSIS — C3490 Malignant neoplasm of unspecified part of unspecified bronchus or lung: Secondary | ICD-10-CM | POA: Diagnosis not present

## 2021-06-12 DIAGNOSIS — I10 Essential (primary) hypertension: Secondary | ICD-10-CM | POA: Insufficient documentation

## 2021-06-12 DIAGNOSIS — C7931 Secondary malignant neoplasm of brain: Secondary | ICD-10-CM | POA: Diagnosis present

## 2021-06-12 HISTORY — DX: Essential (primary) hypertension: I10

## 2021-06-12 HISTORY — PX: RADIOLOGY WITH ANESTHESIA: SHX6223

## 2021-06-12 SURGERY — MRI WITH ANESTHESIA
Anesthesia: General

## 2021-06-12 MED ORDER — LACTATED RINGERS IV SOLN: INTRAVENOUS | Status: DC

## 2021-06-12 MED ORDER — FENTANYL CITRATE (PF) 250 MCG/5ML IJ SOLN
INTRAMUSCULAR | Status: DC | PRN
Start: 2021-06-12 — End: 2021-06-12
  Administered 2021-06-12: 50 ug via INTRAVENOUS

## 2021-06-12 MED ORDER — CHLORHEXIDINE GLUCONATE 0.12 % MT SOLN
OROMUCOSAL | Status: AC
  Administered 2021-06-12: 09:00:00 15 mL via OROMUCOSAL
  Filled 2021-06-12: qty 15

## 2021-06-12 MED ORDER — OXYCODONE HCL 5 MG/5ML PO SOLN: 5.0000 mg | Freq: Once | ORAL | Status: DC | PRN

## 2021-06-12 MED ORDER — OXYCODONE HCL 5 MG PO TABS: 5.0000 mg | ORAL_TABLET | Freq: Once | ORAL | Status: DC | PRN

## 2021-06-12 MED ORDER — HYDROMORPHONE HCL 1 MG/ML IJ SOLN: 0.2500 mg | INTRAMUSCULAR | Status: DC | PRN

## 2021-06-12 MED ORDER — AMISULPRIDE (ANTIEMETIC) 5 MG/2ML IV SOLN: 10.0000 mg | Freq: Once | INTRAVENOUS | Status: DC | PRN

## 2021-06-12 MED ORDER — CHLORHEXIDINE GLUCONATE 0.12 % MT SOLN: 15.0000 mL | Freq: Once | OROMUCOSAL | Status: AC

## 2021-06-12 MED ORDER — PROMETHAZINE HCL 25 MG/ML IJ SOLN: 6.2500 mg | INTRAMUSCULAR | Status: DC | PRN

## 2021-06-12 MED ORDER — LIDOCAINE 2% (20 MG/ML) 5 ML SYRINGE
INTRAMUSCULAR | Status: DC | PRN
  Administered 2021-06-12: 60 mg via INTRAVENOUS

## 2021-06-12 MED ORDER — PHENYLEPHRINE 40 MCG/ML (10ML) SYRINGE FOR IV PUSH (FOR BLOOD PRESSURE SUPPORT)
PREFILLED_SYRINGE | INTRAVENOUS | Status: DC | PRN
  Administered 2021-06-12: 80 ug via INTRAVENOUS
  Administered 2021-06-12: 40 ug via INTRAVENOUS

## 2021-06-12 MED ORDER — PHENYLEPHRINE HCL-NACL 20-0.9 MG/250ML-% IV SOLN
INTRAVENOUS | Status: DC | PRN
Start: 2021-06-12 — End: 2021-06-12
  Administered 2021-06-12: 15 ug/min via INTRAVENOUS

## 2021-06-12 MED ORDER — MEPERIDINE HCL 25 MG/ML IJ SOLN: 6.2500 mg | INTRAMUSCULAR | Status: DC | PRN

## 2021-06-12 MED ORDER — ORAL CARE MOUTH RINSE: 15.0000 mL | Freq: Once | OROMUCOSAL | Status: AC

## 2021-06-12 MED ORDER — ONDANSETRON HCL 4 MG/2ML IJ SOLN
INTRAMUSCULAR | Status: DC | PRN
  Administered 2021-06-12: 4 mg via INTRAVENOUS

## 2021-06-12 MED ORDER — DEXAMETHASONE SODIUM PHOSPHATE 10 MG/ML IJ SOLN
INTRAMUSCULAR | Status: DC | PRN
  Administered 2021-06-12: 4 mg via INTRAVENOUS

## 2021-06-12 MED ORDER — EPHEDRINE SULFATE-NACL 50-0.9 MG/10ML-% IV SOSY
PREFILLED_SYRINGE | INTRAVENOUS | Status: DC | PRN
  Administered 2021-06-12 (×2): 5 mg via INTRAVENOUS

## 2021-06-12 MED ORDER — GADOBUTROL 1 MMOL/ML IV SOLN
6.5000 mL | Freq: Once | INTRAVENOUS | Status: AC | PRN
  Administered 2021-06-12: 12:00:00 6.5 mL via INTRAVENOUS

## 2021-06-12 MED ORDER — PROPOFOL 10 MG/ML IV BOLUS
INTRAVENOUS | Status: DC | PRN
  Administered 2021-06-12: 200 mg via INTRAVENOUS

## 2021-06-12 NOTE — Anesthesia Preprocedure Evaluation (Signed)
Anesthesia Evaluation  ?Patient identified by MRN, date of birth, ID band ?Patient awake ? ? ? ?Reviewed: ?Allergy & Precautions, NPO status , Patient's Chart, lab work & pertinent test results ? ?Airway ?Mallampati: II ? ?TM Distance: >3 FB ?Neck ROM: Full ? ? ? Dental ? ?(+) Teeth Intact, Dental Advisory Given ?  ?Pulmonary ?shortness of breath, asthma , COPD,  COPD inhaler and oxygen dependent, former smoker,  ?Lung ca with mets ? ?  ?Pulmonary exam normal ?breath sounds clear to auscultation ? ? ? ? ? ? Cardiovascular ?hypertension, Pt. on medications ?+ angina Normal cardiovascular exam ?Rhythm:Regular Rate:Normal ? ? ?  ?Neuro/Psych ? Headaches, BRAIN METASTASES ?S/p ACDF ? Neuromuscular disease   ? GI/Hepatic ?Neg liver ROS, GERD  ,  ?Endo/Other  ?negative endocrine ROS ? Renal/GU ?negative Renal ROS  ? ?  ?Musculoskeletal ? ?(+) Arthritis , Osteoarthritis,   ? Abdominal ?  ?Peds ? Hematology ? ?(+) Blood dyscrasia, anemia ,   ?Anesthesia Other Findings ?Day of surgery medications reviewed with the patient. ? Reproductive/Obstetrics ? ?  ? ? ? ? ? ? ? ? ? ? ? ? ? ?  ?  ? ? ? ? ? ? ? ? ?Anesthesia Physical ? ?Anesthesia Plan ? ?ASA: 4 ? ?Anesthesia Plan: General  ? ?Post-op Pain Management:   ? ?Induction: Intravenous ? ?PONV Risk Score and Plan: 3 and Dexamethasone, Ondansetron, Midazolam and Treatment may vary due to age or medical condition ? ?Airway Management Planned: LMA ? ?Additional Equipment:  ? ?Intra-op Plan:  ? ?Post-operative Plan: Extubation in OR ? ?Informed Consent: I have reviewed the patients History and Physical, chart, labs and discussed the procedure including the risks, benefits and alternatives for the proposed anesthesia with the patient or authorized representative who has indicated his/her understanding and acceptance.  ? ? ? ?Dental advisory given ? ?Plan Discussed with: CRNA ? ?Anesthesia Plan Comments:   ? ? ? ? ? ? ?Anesthesia Quick Evaluation ? ?

## 2021-06-12 NOTE — Anesthesia Postprocedure Evaluation (Signed)
Anesthesia Post Note ? ?Patient: Beatrice Lecher ? ?Procedure(s) Performed: MRI WITH ANESTHESIA OF BRAIN WITH AND WITHOUT CONTRAST ? ?  ? ?Patient location during evaluation: PACU ?Anesthesia Type: General ?Level of consciousness: awake and alert ?Pain management: pain level controlled ?Vital Signs Assessment: post-procedure vital signs reviewed and stable ?Respiratory status: spontaneous breathing, nonlabored ventilation and respiratory function stable ?Cardiovascular status: blood pressure returned to baseline and stable ?Postop Assessment: no apparent nausea or vomiting ?Anesthetic complications: no ? ? ?No notable events documented. ? ?Last Vitals:  ?Vitals:  ? 06/12/21 1305 06/12/21 1320  ?BP: 129/76 135/75  ?Pulse: 71 72  ?Resp: 18 18  ?Temp:  36.9 ?C  ?SpO2: 100% 100%  ?  ?Last Pain:  ?Vitals:  ? 06/12/21 1320  ?PainSc: 0-No pain  ? ? ?  ?  ?  ?  ?  ?  ? ?Lynda Rainwater ? ? ? ? ?

## 2021-06-12 NOTE — Transfer of Care (Signed)
Immediate Anesthesia Transfer of Care Note ? ?Patient: Elizabeth Mayer ? ?Procedure(s) Performed: MRI WITH ANESTHESIA OF BRAIN WITH AND WITHOUT CONTRAST ? ?Patient Location: PACU ? ?Anesthesia Type:General ? ?Level of Consciousness: awake, oriented, drowsy and patient cooperative ? ?Airway & Oxygen Therapy: Patient Spontanous Breathing and Patient connected to nasal cannula oxygen ? ?Post-op Assessment: Report given to RN, Post -op Vital signs reviewed and stable and Patient moving all extremities ? ?Post vital signs: Reviewed and stable ? ?Last Vitals:  ?Vitals Value Taken Time  ?BP 149/78 06/12/21 1233  ?Temp    ?Pulse 71 06/12/21 1236  ?Resp 16 06/12/21 1236  ?SpO2 100 % 06/12/21 1236  ?Vitals shown include unvalidated device data. ? ?Last Pain:  ?Vitals:  ? 06/12/21 0852  ?PainSc: 0-No pain  ?   ? ?  ? ?Complications: No notable events documented. ?

## 2021-06-12 NOTE — Anesthesia Procedure Notes (Signed)
Procedure Name: LMA Insertion ?Date/Time: 06/12/2021 11:05 AM ?Performed by: Lowella Dell, CRNA ?Pre-anesthesia Checklist: Patient identified, Emergency Drugs available, Suction available and Patient being monitored ?Patient Re-evaluated:Patient Re-evaluated prior to induction ?Oxygen Delivery Method: Circle System Utilized ?Preoxygenation: Pre-oxygenation with 100% oxygen ?Induction Type: IV induction ?Ventilation: Mask ventilation without difficulty ?LMA: LMA inserted ?LMA Size: 4.0 ?Number of attempts: 1 ?Airway Equipment and Method: Bite block ?Placement Confirmation: positive ETCO2 ?Tube secured with: Tape ?Dental Injury: Teeth and Oropharynx as per pre-operative assessment  ? ? ? ? ?

## 2021-06-13 ENCOUNTER — Inpatient Hospital Stay: Payer: Medicaid Other

## 2021-06-13 ENCOUNTER — Encounter (HOSPITAL_COMMUNITY): Payer: Self-pay | Admitting: Radiology

## 2021-06-13 ENCOUNTER — Other Ambulatory Visit: Payer: Self-pay | Admitting: *Deleted

## 2021-06-13 ENCOUNTER — Inpatient Hospital Stay (HOSPITAL_BASED_OUTPATIENT_CLINIC_OR_DEPARTMENT_OTHER): Payer: Medicaid Other | Admitting: Physician Assistant

## 2021-06-13 ENCOUNTER — Other Ambulatory Visit: Payer: Self-pay | Admitting: Physician Assistant

## 2021-06-13 ENCOUNTER — Inpatient Hospital Stay: Payer: Medicaid Other | Attending: Internal Medicine

## 2021-06-13 VITALS — BP 148/69 | HR 96 | Temp 97.8°F | Resp 19 | Ht 65.0 in | Wt 148.3 lb

## 2021-06-13 DIAGNOSIS — Z5112 Encounter for antineoplastic immunotherapy: Secondary | ICD-10-CM | POA: Diagnosis present

## 2021-06-13 DIAGNOSIS — R600 Localized edema: Secondary | ICD-10-CM | POA: Diagnosis not present

## 2021-06-13 DIAGNOSIS — R5383 Other fatigue: Secondary | ICD-10-CM | POA: Diagnosis not present

## 2021-06-13 DIAGNOSIS — C7931 Secondary malignant neoplasm of brain: Secondary | ICD-10-CM | POA: Insufficient documentation

## 2021-06-13 DIAGNOSIS — Z87891 Personal history of nicotine dependence: Secondary | ICD-10-CM | POA: Diagnosis not present

## 2021-06-13 DIAGNOSIS — C3491 Malignant neoplasm of unspecified part of right bronchus or lung: Secondary | ICD-10-CM

## 2021-06-13 DIAGNOSIS — Z5111 Encounter for antineoplastic chemotherapy: Secondary | ICD-10-CM | POA: Diagnosis not present

## 2021-06-13 DIAGNOSIS — D6481 Anemia due to antineoplastic chemotherapy: Secondary | ICD-10-CM | POA: Insufficient documentation

## 2021-06-13 DIAGNOSIS — C779 Secondary and unspecified malignant neoplasm of lymph node, unspecified: Secondary | ICD-10-CM | POA: Insufficient documentation

## 2021-06-13 DIAGNOSIS — R42 Dizziness and giddiness: Secondary | ICD-10-CM | POA: Insufficient documentation

## 2021-06-13 DIAGNOSIS — I10 Essential (primary) hypertension: Secondary | ICD-10-CM | POA: Insufficient documentation

## 2021-06-13 DIAGNOSIS — Z95828 Presence of other vascular implants and grafts: Secondary | ICD-10-CM

## 2021-06-13 DIAGNOSIS — M546 Pain in thoracic spine: Secondary | ICD-10-CM | POA: Diagnosis not present

## 2021-06-13 DIAGNOSIS — Z79899 Other long term (current) drug therapy: Secondary | ICD-10-CM | POA: Diagnosis not present

## 2021-06-13 LAB — CBC WITH DIFFERENTIAL (CANCER CENTER ONLY)
Abs Immature Granulocytes: 0.01 10*3/uL (ref 0.00–0.07)
Basophils Absolute: 0 10*3/uL (ref 0.0–0.1)
Basophils Relative: 1 %
Eosinophils Absolute: 0.1 10*3/uL (ref 0.0–0.5)
Eosinophils Relative: 3 %
HCT: 29.4 % — ABNORMAL LOW (ref 36.0–46.0)
Hemoglobin: 9.4 g/dL — ABNORMAL LOW (ref 12.0–15.0)
Immature Granulocytes: 0 %
Lymphocytes Relative: 19 %
Lymphs Abs: 0.8 10*3/uL (ref 0.7–4.0)
MCH: 31.5 pg (ref 26.0–34.0)
MCHC: 32 g/dL (ref 30.0–36.0)
MCV: 98.7 fL (ref 80.0–100.0)
Monocytes Absolute: 0.5 10*3/uL (ref 0.1–1.0)
Monocytes Relative: 11 %
Neutro Abs: 2.6 10*3/uL (ref 1.7–7.7)
Neutrophils Relative %: 66 %
Platelet Count: 195 10*3/uL (ref 150–400)
RBC: 2.98 MIL/uL — ABNORMAL LOW (ref 3.87–5.11)
RDW: 14.1 % (ref 11.5–15.5)
WBC Count: 3.9 10*3/uL — ABNORMAL LOW (ref 4.0–10.5)
nRBC: 0 % (ref 0.0–0.2)

## 2021-06-13 LAB — CMP (CANCER CENTER ONLY)
ALT: 11 U/L (ref 0–44)
AST: 21 U/L (ref 15–41)
Albumin: 3.7 g/dL (ref 3.5–5.0)
Alkaline Phosphatase: 47 U/L (ref 38–126)
Anion gap: 5 (ref 5–15)
BUN: 14 mg/dL (ref 8–23)
CO2: 31 mmol/L (ref 22–32)
Calcium: 9 mg/dL (ref 8.9–10.3)
Chloride: 107 mmol/L (ref 98–111)
Creatinine: 1.17 mg/dL — ABNORMAL HIGH (ref 0.44–1.00)
GFR, Estimated: 53 mL/min — ABNORMAL LOW (ref >60)
Glucose, Bld: 88 mg/dL (ref 70–99)
Potassium: 3.8 mmol/L (ref 3.5–5.1)
Sodium: 143 mmol/L (ref 135–145)
Total Bilirubin: 0.2 mg/dL — ABNORMAL LOW (ref 0.3–1.2)
Total Protein: 6.5 g/dL (ref 6.5–8.1)

## 2021-06-13 LAB — TSH: TSH: 1.302 u[IU]/mL (ref 0.308–3.960)

## 2021-06-13 MED ORDER — HEPARIN SOD (PORK) LOCK FLUSH 100 UNIT/ML IV SOLN
500.0000 [IU] | Freq: Once | INTRAVENOUS | Status: AC | PRN
  Administered 2021-06-13: 500 [IU]

## 2021-06-13 MED ORDER — SODIUM CHLORIDE 0.9% FLUSH
10.0000 mL | INTRAVENOUS | Status: DC | PRN
  Administered 2021-06-13: 10 mL

## 2021-06-13 MED ORDER — SODIUM CHLORIDE 0.9% FLUSH
10.0000 mL | Freq: Once | INTRAVENOUS | Status: AC
  Administered 2021-06-13: 10 mL

## 2021-06-13 MED ORDER — PROCHLORPERAZINE MALEATE 10 MG PO TABS
10.0000 mg | ORAL_TABLET | Freq: Once | ORAL | Status: AC
  Administered 2021-06-13: 10 mg via ORAL
  Filled 2021-06-13: qty 1

## 2021-06-13 MED ORDER — SODIUM CHLORIDE 0.9 % IV SOLN
200.0000 mg | Freq: Once | INTRAVENOUS | Status: AC
  Administered 2021-06-13: 200 mg via INTRAVENOUS
  Filled 2021-06-13: qty 200

## 2021-06-13 MED ORDER — CYANOCOBALAMIN 1000 MCG/ML IJ SOLN
1000.0000 ug | Freq: Once | INTRAMUSCULAR | Status: AC
  Administered 2021-06-13: 1000 ug via INTRAMUSCULAR
  Filled 2021-06-13: qty 1

## 2021-06-13 MED ORDER — SODIUM CHLORIDE 0.9 % IV SOLN
500.0000 mg/m2 | Freq: Once | INTRAVENOUS | Status: AC
  Administered 2021-06-13: 900 mg via INTRAVENOUS
  Filled 2021-06-13: qty 20

## 2021-06-13 MED ORDER — SODIUM CHLORIDE 0.9 % IV SOLN: Freq: Once | INTRAVENOUS | Status: AC

## 2021-06-13 MED ORDER — BLOOD PRESSURE MONITOR AUTOMAT DEVI: 0 refills | Status: DC

## 2021-06-13 NOTE — Patient Instructions (Signed)
[facility]  Discharge Instructions: Thank you for choosing [facility] to provide your oncology and hematology care.   If you have a lab appointment with the Cancer Center, please go directly to the Cancer Center and check in at the registration area.   Wear comfortable clothing and clothing appropriate for easy access to any Portacath or PICC line.   We strive to give you quality time with your provider. You may need to reschedule your appointment if you arrive late (15 or more minutes).  Arriving late affects you and other patients whose appointments are after yours.  Also, if you miss three or more appointments without notifying the office, you may be dismissed from the clinic at the provider's discretion.      For prescription refill requests, have your pharmacy contact our office and allow 72 hours for refills to be completed.    Today you received the following chemotherapy and/or immunotherapy agents Pembrolizumab (Keytruda) and Pemetrexed (Alimta)      To help prevent nausea and vomiting after your treatment, we encourage you to take your nausea medication as directed.  BELOW ARE SYMPTOMS THAT SHOULD BE REPORTED IMMEDIATELY: *FEVER GREATER THAN 100.4 F (38 C) OR HIGHER *CHILLS OR SWEATING *NAUSEA AND VOMITING THAT IS NOT CONTROLLED WITH YOUR NAUSEA MEDICATION *UNUSUAL SHORTNESS OF BREATH *UNUSUAL BRUISING OR BLEEDING *URINARY PROBLEMS (pain or burning when urinating, or frequent urination) *BOWEL PROBLEMS (unusual diarrhea, constipation, pain near the anus) TENDERNESS IN MOUTH AND THROAT WITH OR WITHOUT PRESENCE OF ULCERS (sore throat, sores in mouth, or a toothache) UNUSUAL RASH, SWELLING OR PAIN  UNUSUAL VAGINAL DISCHARGE OR ITCHING   Items with * indicate a potential emergency and should be followed up as soon as possible or go to the Emergency Department if any problems should occur.  Please show the CHEMOTHERAPY ALERT CARD or  IMMUNOTHERAPY ALERT CARD at check-in to the Emergency Department and triage nurse.  Should you have questions after your visit or need to cancel or reschedule your appointment, please contact [facility]  Dept: [phone]  and follow the prompts.  Office hours are 8:00 a.m. to 4:30 p.m. Monday - Friday. Please note that voicemails left after 4:00 p.m. may not be returned until the following business day.  We are closed weekends and major holidays. You have access to a nurse at all times for urgent questions. Please call the main number to the clinic Dept: [phone] and follow the prompts.   For any non-urgent questions, you may also contact your provider using MyChart. We now offer e-Visits for anyone 18 and older to request care online for non-urgent symptoms. For details visit [email or web address].   Also download the MyChart app! Go to the app store, search "MyChart", open the app, select [facility], and log in with your MyChart username and password.  Due to Covid, a mask is required upon entering the hospital/clinic. If you do not have a mask, one will be given to you upon arrival. For doctor visits, patients may have 1 support person aged 18 or older with them. For treatment visits, patients cannot have anyone with them due to current Covid guidelines and our immunocompromised population.   

## 2021-06-15 ENCOUNTER — Encounter: Payer: Self-pay | Admitting: Physician Assistant

## 2021-06-15 ENCOUNTER — Encounter: Payer: Self-pay | Admitting: Hematology and Oncology

## 2021-06-15 NOTE — Progress Notes (Signed)
[place] [phone]   [phone]  PROGRESS NOTE  Patient Care Team: Minette Brine, FNP as PCP - General (General Practice)  Hematological/Oncological History # Metastatic Adenocarcinoma of the Lung #Brain Metastasis 1) 11/08/2019: establish care with Dr. Julien Nordmann and his PA Cassie Heilingoetter. Noted to have widely metastatic lung cancer with involvement of the brain, lymph nodes, and right lung.  2) 12/04/2019: palliative radiation to the right lung mass/cervical adenopathy 3) 9/7/02021: started therapy with Carbo/Pem/Pem 4) 9/29-10/09/2019: patient underwent SRS to the brain mets 5) 03/22/2020: repeat CT C/A/P and neck scheduled. Transfer care to Dr. Lorenso Courier.   6) 03/22/2020: CT C/A/P showed interval significant improvement in previously demonstrated extensive confluent lymphadenopathy in the superior mediastinum and right hilar regions. 7) 08/15/2020: Holding chemotherapy due to poor Hgb and fatigue. Allowing patient a brief 'chemo holiday' to rebound appropriately from last cycle.  8) 08/29/2020: restarted chemotherapy, Cycle 12 Day 1 9) 09/19/2020: patient requested to HOLD Cycle 13 in setting of fatigue/weakness.  10) 11/29/2020: Cycle 14 Day 1 of chemotherapy, delayed start due to insurance issues.   Interval History:  Trishelle A Gerald 61 y.o. female with medical history significant for metastatic adenocarcinoma of the lung who presents for follow up. The patient's last visit was on 05/23/2021 when she received Cycle 22, Day 1 of pem/pem maintenance.  In the interim since her last visit she has had no other changed in her health.   On exam today Mrs. Midgett reports that her energy and appetite are stable. She is able to complete her daily activities on her own. Her weight has not changed since the last visit. She denies any nausea, vomiting or abdominal pain. Her bowel habits are unchanged without any diarrhea or constipation. She reports persistent lower  leg edema, mainly around the ankles following each treatment. She reports that the edema did not improve until recently. She reports some improvement with elevation of her legs. She reports occasional episodes of dizziness that has increased some in frequency. She denies any headaches or syncopal episodes. She denies fevers, chills, night sweats, chest pain or cough. She has no other complaints.  A full 10 point ROS is listed below.  MEDICAL HISTORY:  Past Medical History:  Diagnosis Date   Anemia    Angina 1982   related to stress   Asthma    in the past,  no current problems   Complication of anesthesia    states anesthesia made her hair fall out   Constipation    Dyspnea    on oxygen at home - 3L via [place]   Fatigue    GERD (gastroesophageal reflux disease)    patient denies this dx   Headache    Heart murmur 1970s   no problems currently   Hypertension    Ingrown toenail    Lung cancer ([place]) 10/2019   metastatic disease to the brain   On home oxygen therapy    2L via [place] - 24 hours a day   Past heart attack 1980-1981   pt states she passed out and woke up in hospital- told she had heart attack, but then dr said he couldn't find anything wrong.   Pneumonia    x 1    SURGICAL HISTORY: Past Surgical History:  Procedure Laterality Date   CERVICAL DISC SURGERY  2000   Disc removed from neck    ingrown toe nail surgery Bilateral    IR IMAGING GUIDED PORT INSERTION  02/16/2020  MULTIPLE TOOTH EXTRACTIONS     for braces   RADIOLOGY WITH ANESTHESIA N/A 12/05/2019   Procedure: MRI BRAIN WITH AND WITHOUT CONTRAST;  Surgeon: Radiologist, Medication, MD;  Location: [facility];  Service: Radiology;  Laterality: N/A;   RADIOLOGY WITH ANESTHESIA N/A 01/02/2020   Procedure: MRI BRAIN WITH AND WITHOUT CONTRAST;  Surgeon: Radiologist, Medication, MD;  Location: [facility];  Service: Radiology;  Laterality: N/A;   RADIOLOGY WITH ANESTHESIA N/A 02/20/2020   Procedure: MRI WITH ANESTHESIA OF BRAIN WITH  AND WITHOUT CONTRAST;  Surgeon: Radiologist, Medication, MD;  Location: [facility];  Service: Radiology;  Laterality: N/A;   RADIOLOGY WITH ANESTHESIA N/A 06/11/2020   Procedure: MRI WITH ANESTHESIA OF BRAIN WITH AND WITHOUT CONTRAST;  Surgeon: Radiologist, Medication, MD;  Location: [facility];  Service: Radiology;  Laterality: N/A;   RADIOLOGY WITH ANESTHESIA N/A 10/10/2020   Procedure: MRI WITH ANESTHESIA BRAIN WITH AND WITHOUT CONTRAST;  Surgeon: Radiologist, Medication, MD;  Location: [facility];  Service: Radiology;  Laterality: N/A;   RADIOLOGY WITH ANESTHESIA N/A 01/30/2021   Procedure: MRI BRAIN WITH AND WITHOUT CONTRASTWITH ANESTHESIA;  Surgeon: Radiologist, Medication, MD;  Location: [facility];  Service: Radiology;  Laterality: N/A;   RADIOLOGY WITH ANESTHESIA N/A 06/12/2021   Procedure: MRI WITH ANESTHESIA OF BRAIN WITH AND WITHOUT CONTRAST;  Surgeon: Radiologist, Medication, MD;  Location: [facility];  Service: Radiology;  Laterality: N/A;   TONSILLECTOMY      SOCIAL HISTORY: Social History   Socioeconomic History   Marital status: Legally Separated    Spouse name: Not on file   Number of children: 0   Years of education: Not on file   Highest education level: Not on file  Occupational History   Occupation: Secondary school teacher  Tobacco Use   Smoking status: Former    Packs/day: 1.00    Years: 20.00    Pack years: 20.00    Types: Cigarettes    Start date: 10    Quit date: 2001    Years since quitting: 22.2   Smokeless tobacco: Never  Vaping Use   Vaping Use: Never used  Substance and Sexual Activity   Alcohol use: No   Drug use: No   Sexual activity: Yes    Birth control/protection: None, Post-menopausal  Other Topics Concern   Not on file  Social History Narrative   Not on file   Social Determinants of Health   Financial Resource Strain: Not on file  Food Insecurity: Not on file  Transportation Needs: Not on file  Physical Activity: Not on file  Stress: Not on file  Social  Connections: Not on file  Intimate Partner Violence: Not on file    FAMILY HISTORY: Family History  Problem Relation Age of Onset   Arthritis Mother    Heart failure Father     ALLERGIES:  is allergic to pseudoephedrine, gabapentin, mobic [meloxicam], penicillins, and prednisone.  MEDICATIONS:  Current Outpatient Medications  Medication Sig Dispense Refill   acetaminophen (TYLENOL) 500 MG tablet Take 1,000 mg by mouth 2 (two) times daily as needed (pain.).     albuterol (VENTOLIN HFA) 108 (90 Base) MCG/ACT inhaler Inhale 1-2 puffs into the lungs See admin instructions. Use 1 inhalation twice daily (scheduled) & may use every 6 hours as needed for wheezing/shortness of breath     amLODipine (NORVASC) 5 MG tablet Take 1 tablet (5 mg total) by mouth daily. 30 tablet 2   benzoyl peroxide (CERAVE ACNE FOAMING CREAM) 4 % external liquid Apply 1  application topically 2 (two) times daily. Face     bisacodyl (DULCOLAX) 5 MG EC tablet Take 5 mg by mouth daily as needed for moderate constipation.     Carboxymethylcellulose Sodium (REFRESH CELLUVISC OP) Place 2 drops into both eyes in the morning and at bedtime.     cetaphil (CETAPHIL) lotion Apply 1 application. topically as needed for dry skin.     clindamycin (CLEOCIN T) 1 % lotion Apply 1 application topically 2 (two) times daily.     cyclobenzaprine (FLEXERIL) 10 MG tablet Take 1 tablet (10 mg total) by mouth 3 (three) times daily as needed for muscle spasms. 30 tablet 0   diltiazem (CARDIZEM) 30 MG tablet Take 1 tablet (30 mg total) by mouth 4 (four) times daily. (Patient taking differently: Take 30 mg by mouth 4 (four) times daily as needed (afib episodes).) 120 tablet 1   docusate sodium (COLACE) 100 MG capsule Take 100 mg by mouth 2 (two) times daily as needed for moderate constipation.     fexofenadine (ALLEGRA) 180 MG tablet Take 180 mg by mouth in the morning.     fluticasone (FLONASE) 50 MCG/ACT nasal spray Place 1 spray into both  nostrils daily.     folic acid (FOLVITE) 1 MG tablet Take 1 tablet by mouth once daily 90 tablet 0   guaiFENesin (MUCINEX) 600 MG 12 hr tablet Take 600 mg by mouth 2 (two) times daily.      HYDROcodone-acetaminophen (NORCO/VICODIN) 5-325 MG tablet Take 1 tablet by mouth every 6 (six) hours as needed for moderate pain. 30 tablet 0   lidocaine (XYLOCAINE) 2 % solution Use as directed 15 mLs in the mouth or throat as needed for mouth pain. Swallow 30 min prior to meals and at bedtime for throat/esophagus pain 250 mL 1   lidocaine-prilocaine (EMLA) cream Apply 1 application topically as needed. (Patient taking differently: Apply 1 application. topically as needed (port access). Every three weeks) 30 g 2   Multiple Vitamin (MULTIVITAMIN WITH MINERALS) TABS tablet Take 2 tablets by mouth daily. Alive/ gummies     ondansetron (ZOFRAN) 8 MG tablet Take 1 tablet (8 mg total) by mouth every 8 (eight) hours as needed for nausea or vomiting. 20 tablet 0   OXYGEN Inhale 3 L/min into the lungs See admin instructions. At bedtime & as needed during the day     PROAIR HFA 108 (90 Base) MCG/ACT inhaler Inhale 1-2 puffs into the lungs every 6 (six) hours as needed for wheezing or shortness of breath. 18 g 3   Soap & Cleansers (MEDERMA AG FACIAL TONER) LIQD Apply 1 application topically 2 (two) times daily. Advance dry skin     SYMBICORT 80-4.5 MCG/ACT inhaler INHALE 2 PUFFS BY MOUTH IN THE MORNING AND AT BEDTIME 11 g 5   triamcinolone ointment (KENALOG) 0.1 % Apply 1 application topically in the morning and at bedtime.     Blood Pressure Monitoring (BLOOD PRESSURE MONITOR AUTOMAT) DEVI Check Blood pressure at home daily 1 each 0   No current facility-administered medications for this visit.    REVIEW OF SYSTEMS:   Constitutional: ( - ) fevers, ( - )  chills , ( - ) night sweats Eyes: ( - ) blurriness of vision, ( - ) double vision, ( - ) watery eyes Ears, nose, mouth, throat, and face: ( - ) mucositis, ( - ) sore  throat Respiratory: ( - ) cough, ( - ) dyspnea, ( - ) wheezes Cardiovascular: ( - )  palpitation, ( - ) chest discomfort, ( +) lower extremity swelling Gastrointestinal:  ( - ) nausea, ( - ) heartburn, ( - ) change in bowel habits Skin: ( - ) abnormal skin rashes Lymphatics: ( - ) new lymphadenopathy, ( - ) easy bruising Neurological: ( - ) numbness, ( - ) tingling, ( - ) new weaknesses Behavioral/Psych: ( - ) mood change, ( - ) new changes  All other systems were reviewed with the patient and are negative.  PHYSICAL EXAMINATION: ECOG PERFORMANCE STATUS: 2 - Symptomatic, <50% confined to bed  Vitals:   06/13/21 1130  BP: (!) 148/69  Pulse: 96  Resp: 19  Temp: 97.8 F (36.6 C)  SpO2: 97%     Filed Weights   06/13/21 1130  Weight: 148 lb 4.8 oz (67.3 kg)     GENERAL: well appearing middle aged [place] American female. alert, no distress and comfortable SKIN: skin color, texture, turgor are normal, no rashes or significant lesions EYES: conjunctiva are pink and non-injected, sclera clear LUNGS: clear to auscultation and percussion with normal breathing effort HEART: regular rate & rhythm and no murmurs and trace bilateral lower extremity edema Musculoskeletal: no cyanosis of digits and no clubbing.  PSYCH: alert & oriented x 3, fluent speech NEURO: no focal motor/sensory deficits   LABORATORY DATA:  I have reviewed the data as listed CBC Latest Ref Rng & Units 06/13/2021 05/23/2021 05/02/2021  WBC 4.0 - 10.5 K/uL 3.9(L) 4.5 4.3  Hemoglobin 12.0 - 15.0 g/dL 9.4(L) 9.4(L) 10.0(L)  Hematocrit 36.0 - 46.0 % 29.4(L) 28.7(L) 30.7(L)  Platelets 150 - 400 K/uL 195 158 178    CMP Latest Ref Rng & Units 06/13/2021 05/23/2021 05/13/2021  Glucose 70 - 99 mg/dL 88 84 76  BUN 8 - 23 mg/dL 14 13 12   Creatinine 0.44 - 1.00 mg/dL 1.17(H) 1.07(H) 1.17(H)  Sodium 135 - 145 mmol/L 143 142 145(H)  Potassium 3.5 - 5.1 mmol/L 3.8 3.6 4.1  Chloride 98 - 111 mmol/L 107 106 106  CO2 22 - 32 mmol/L  31 31 24   Calcium 8.9 - 10.3 mg/dL 9.0 9.2 9.2  Total Protein 6.5 - 8.1 g/dL 6.5 6.6 -  Total Bilirubin 0.3 - 1.2 mg/dL 0.2(L) 0.3 -  Alkaline Phos 38 - 126 U/L 47 40 -  AST 15 - 41 U/L 21 21 -  ALT 0 - 44 U/L 11 9 -    RADIOGRAPHIC STUDIES: MR Brain W Wo Contrast  Result Date: 06/12/2021 CLINICAL DATA:  Brain metastasis.  Assess treatment response EXAM: MRI HEAD WITHOUT AND WITH CONTRAST TECHNIQUE: Multiplanar, multiecho pulse sequences of the brain and surrounding structures were obtained without and with intravenous contrast. CONTRAST:  6.34mL GADAVIST GADOBUTROL 1 MMOL/ML IV SOLN COMPARISON:  MRI head 01/30/2021 FINDINGS: Brain: Multiple enhancing lesions the brain compatible with metastatic disease are stable. No new or enlarging lesions. 7 mm ring-enhancing lesion right anterior temporal lobe unchanged. Mild surrounding edema 4 mm enhancing lesion right parietal lobe axial postcontrast image 173 unchanged. Mild associated hemorrhage. No edema Cluster of enhancing lesions in the right parietal lobe unchanged. These show evidence of prior hemorrhage. Largest lesion is 7 mm 10 mm ring-enhancing lesion left frontal lobe axial image 220 unchanged. Associated chronic blood products unchanged. Punctate enhancement left frontal cortex unchanged.  Axial image 226 Ill-defined enhancement left medial parietal lobe axial image 190 a most likely is vascular enhancement based on sagittal and coronal images. Attention on follow-up studies recommended. Ventricle size normal.  No midline  shift.  No acute infarct. Vascular: Normal arterial flow voids. Skull and upper cervical spine: No focal skeletal lesion Sinuses/Orbits: Mild mucosal edema paranasal sinuses. Negative orbit Other: None IMPRESSION: Multiple enhancing brain metastasis are stable from the prior study. Enhancement left medial parietal lobe most likely is vascular enhancement with volume averaging. Attention at follow-up recommended. Electronically Signed    By: Franchot Gallo M.D.   On: 06/12/2021 13:32     ASSESSMENT & PLAN Elizabeth Mayer 60 y.o. female with medical history significant for metastatic adenocarcinoma of the lung who presents for follow up.    After review the labs, the records, discussion with the patient the findings are most consistent with metastatic adenocarcinoma the lung without any targetable mutations.  The patient has been started on carboplatin pemetrexed pembrolizumab every 3 weeks with Dr. Julien Nordmann, however the patient has expressed a desire to transition to another provider.  As such we were happy to take up her care here.  She already discontinued the carboplatin and is on maintenance pemetrexed/pembrolizumab therapy. We assumed her care with Cycle 6 on 04/04/2020.  # Metastatic Adenocarcinoma of the Lung #Brain Metastasis  --no targetable mutations noted on prior workup. --continue to follow with Dr. Laqueta Due Onc for metastatic spread to the brain.  --monitor CMP, CBC, and TSH with each treatment --can consider docetaxel/ramucirumab at time of progression.  Plan: --CT scan shows stable disease in Jan 2023. Next scan in April 2023.  --MRI brain from 06/12/2021 shows stable disease.  --today is Cycle 23 Day 1 of maintenance Pem/Pem --Labs today were reviewed and adequate for treatment today.  --RTC in 3 weeks for Cycle 24 of chemotherapy  #Chemotherapy Induced Anemia --Hgb 9.4, MCV 98.7 today, stable from prior --continue to monitor   # Fatigue: --Uncertain etiology but likely chemotherapy/immunotherapy side effects  --Monitor closely  # Mid back pain: --no findings of metastasis to spine or structural problems with back on last imaging.    #hypokalemia, resolved: --potassium level is 3.8 --continue to monitor  #bilateral ankle edema: --gave prescription for compression stockings --trace edema seen on physical exam   #Memory changes/vision changes/dizziness --Memory loss present for the last  several weeks, mainly with word recognition and difficulty remembering daily tasks.  --Floaters in lower visual field of left eye, occasional.  --Dizziness is more frequent but shorter in duration.  --Most recent MRI brain from 06/12/2021 was stable. Patient is scheduled for a follow up with Dr. Mickeal Skinner on 06/16/2021, await recommendations.   No orders of the defined types were placed in this encounter.   All questions were answered. The patient knows to call the clinic with any problems, questions or concerns.  I have spent a total of 30 minutes minutes of face-to-face and non-face-to-face time, preparing to see the patient, obtaining and/or reviewing separately obtained history, performing a medically appropriate examination, counseling and educating the patient, ordering tests, documenting clinical information in the electronic health record and care coordination.   Dede Query PA-C Dept of Hematology and [place] at [facility] Phone: [phone]   06/15/2021 11:03 AM

## 2021-06-16 ENCOUNTER — Inpatient Hospital Stay: Payer: Medicaid Other | Admitting: Internal Medicine

## 2021-06-16 ENCOUNTER — Inpatient Hospital Stay: Payer: Medicaid Other

## 2021-06-16 ENCOUNTER — Telehealth: Payer: Self-pay | Admitting: Radiation Therapy

## 2021-06-16 NOTE — Telephone Encounter (Signed)
I called Elizabeth Mayer to share the stable results of her recent brain MRI scan. She was pleased to hear the results and does not have any new or worsening CNS symptoms to report. She thought her appointment with Dr. Mickeal Skinner was tomorrow instead of today, so she unintentionally missed this visit. I asked if she wanted me to reschedule the missed visit now or wait until after the next brain MRI in three months. Since she does not have any issues to report, she is happy to come in to see Dr. Mickeal Skinner in three months for follow-up after her MRI.  ? ?Mont Dutton R.T.(R)(T) ?Radiation Special Procedures Navigator ?

## 2021-06-18 ENCOUNTER — Telehealth: Payer: Self-pay | Admitting: Internal Medicine

## 2021-06-18 NOTE — Telephone Encounter (Signed)
.  Called patient to schedule appointment per 3/13 inbasket, patient is aware of date and time.   ?

## 2021-06-26 ENCOUNTER — Inpatient Hospital Stay: Payer: Medicaid Other | Admitting: Internal Medicine

## 2021-06-26 ENCOUNTER — Other Ambulatory Visit: Payer: Self-pay | Admitting: Internal Medicine

## 2021-06-26 ENCOUNTER — Other Ambulatory Visit: Payer: Self-pay | Admitting: *Deleted

## 2021-06-26 ENCOUNTER — Encounter (HOSPITAL_COMMUNITY): Payer: Self-pay | Admitting: *Deleted

## 2021-06-26 DIAGNOSIS — C7931 Secondary malignant neoplasm of brain: Secondary | ICD-10-CM

## 2021-07-04 ENCOUNTER — Other Ambulatory Visit: Payer: Self-pay

## 2021-07-04 ENCOUNTER — Inpatient Hospital Stay: Payer: Medicaid Other

## 2021-07-04 ENCOUNTER — Inpatient Hospital Stay (HOSPITAL_BASED_OUTPATIENT_CLINIC_OR_DEPARTMENT_OTHER): Payer: Medicaid Other | Admitting: Hematology and Oncology

## 2021-07-04 ENCOUNTER — Other Ambulatory Visit: Payer: Self-pay | Admitting: Hematology and Oncology

## 2021-07-04 VITALS — BP 140/64 | HR 88 | Temp 97.9°F | Wt 146.7 lb

## 2021-07-04 DIAGNOSIS — Z95828 Presence of other vascular implants and grafts: Secondary | ICD-10-CM

## 2021-07-04 DIAGNOSIS — C3491 Malignant neoplasm of unspecified part of right bronchus or lung: Secondary | ICD-10-CM

## 2021-07-04 DIAGNOSIS — M546 Pain in thoracic spine: Secondary | ICD-10-CM | POA: Diagnosis not present

## 2021-07-04 DIAGNOSIS — Z5111 Encounter for antineoplastic chemotherapy: Secondary | ICD-10-CM | POA: Diagnosis present

## 2021-07-04 DIAGNOSIS — C7931 Secondary malignant neoplasm of brain: Secondary | ICD-10-CM

## 2021-07-04 DIAGNOSIS — Z5112 Encounter for antineoplastic immunotherapy: Secondary | ICD-10-CM

## 2021-07-04 DIAGNOSIS — D6481 Anemia due to antineoplastic chemotherapy: Secondary | ICD-10-CM | POA: Diagnosis not present

## 2021-07-04 DIAGNOSIS — R600 Localized edema: Secondary | ICD-10-CM | POA: Diagnosis not present

## 2021-07-04 DIAGNOSIS — I10 Essential (primary) hypertension: Secondary | ICD-10-CM | POA: Diagnosis not present

## 2021-07-04 DIAGNOSIS — C779 Secondary and unspecified malignant neoplasm of lymph node, unspecified: Secondary | ICD-10-CM | POA: Diagnosis not present

## 2021-07-04 DIAGNOSIS — R42 Dizziness and giddiness: Secondary | ICD-10-CM | POA: Diagnosis not present

## 2021-07-04 DIAGNOSIS — Z79899 Other long term (current) drug therapy: Secondary | ICD-10-CM | POA: Diagnosis not present

## 2021-07-04 DIAGNOSIS — R5383 Other fatigue: Secondary | ICD-10-CM | POA: Diagnosis not present

## 2021-07-04 DIAGNOSIS — Z87891 Personal history of nicotine dependence: Secondary | ICD-10-CM | POA: Diagnosis not present

## 2021-07-04 LAB — CBC WITH DIFFERENTIAL (CANCER CENTER ONLY)
Abs Immature Granulocytes: 0.01 10*3/uL (ref 0.00–0.07)
Basophils Absolute: 0 10*3/uL (ref 0.0–0.1)
Basophils Relative: 1 %
Eosinophils Absolute: 0.1 10*3/uL (ref 0.0–0.5)
Eosinophils Relative: 3 %
HCT: 29.5 % — ABNORMAL LOW (ref 36.0–46.0)
Hemoglobin: 9.7 g/dL — ABNORMAL LOW (ref 12.0–15.0)
Immature Granulocytes: 0 %
Lymphocytes Relative: 21 %
Lymphs Abs: 0.8 10*3/uL (ref 0.7–4.0)
MCH: 31.7 pg (ref 26.0–34.0)
MCHC: 32.9 g/dL (ref 30.0–36.0)
MCV: 96.4 fL (ref 80.0–100.0)
Monocytes Absolute: 0.3 10*3/uL (ref 0.1–1.0)
Monocytes Relative: 9 %
Neutro Abs: 2.5 10*3/uL (ref 1.7–7.7)
Neutrophils Relative %: 66 %
Platelet Count: 183 10*3/uL (ref 150–400)
RBC: 3.06 MIL/uL — ABNORMAL LOW (ref 3.87–5.11)
RDW: 14.1 % (ref 11.5–15.5)
WBC Count: 3.8 10*3/uL — ABNORMAL LOW (ref 4.0–10.5)
nRBC: 0 % (ref 0.0–0.2)

## 2021-07-04 LAB — CMP (CANCER CENTER ONLY)
ALT: 10 U/L (ref 0–44)
AST: 21 U/L (ref 15–41)
Albumin: 3.7 g/dL (ref 3.5–5.0)
Alkaline Phosphatase: 39 U/L (ref 38–126)
Anion gap: 6 (ref 5–15)
BUN: 13 mg/dL (ref 8–23)
CO2: 30 mmol/L (ref 22–32)
Calcium: 9.2 mg/dL (ref 8.9–10.3)
Chloride: 107 mmol/L (ref 98–111)
Creatinine: 1.15 mg/dL — ABNORMAL HIGH (ref 0.44–1.00)
GFR, Estimated: 54 mL/min — ABNORMAL LOW (ref >60)
Glucose, Bld: 131 mg/dL — ABNORMAL HIGH (ref 70–99)
Potassium: 3.4 mmol/L — ABNORMAL LOW (ref 3.5–5.1)
Sodium: 143 mmol/L (ref 135–145)
Total Bilirubin: 0.3 mg/dL (ref 0.3–1.2)
Total Protein: 6.9 g/dL (ref 6.5–8.1)

## 2021-07-04 LAB — TSH: TSH: 2.252 u[IU]/mL (ref 0.308–3.960)

## 2021-07-04 MED ORDER — PROCHLORPERAZINE MALEATE 10 MG PO TABS
10.0000 mg | ORAL_TABLET | Freq: Once | ORAL | Status: AC
  Administered 2021-07-04: 10 mg via ORAL
  Filled 2021-07-04: qty 1

## 2021-07-04 MED ORDER — HEPARIN SOD (PORK) LOCK FLUSH 100 UNIT/ML IV SOLN
500.0000 [IU] | Freq: Once | INTRAVENOUS | Status: AC | PRN
  Administered 2021-07-04: 500 [IU]

## 2021-07-04 MED ORDER — SODIUM CHLORIDE 0.9% FLUSH
10.0000 mL | INTRAVENOUS | Status: DC | PRN
  Administered 2021-07-04: 10 mL

## 2021-07-04 MED ORDER — SODIUM CHLORIDE 0.9 % IV SOLN: Freq: Once | INTRAVENOUS | Status: DC

## 2021-07-04 MED ORDER — SODIUM CHLORIDE 0.9 % IV SOLN
200.0000 mg | Freq: Once | INTRAVENOUS | Status: AC
  Administered 2021-07-04: 200 mg via INTRAVENOUS
  Filled 2021-07-04: qty 200

## 2021-07-04 MED ORDER — SODIUM CHLORIDE 0.9% FLUSH
10.0000 mL | Freq: Once | INTRAVENOUS | Status: AC
  Administered 2021-07-04: 10 mL

## 2021-07-04 MED ORDER — SODIUM CHLORIDE 0.9 % IV SOLN: Freq: Once | INTRAVENOUS | Status: AC

## 2021-07-04 MED ORDER — SODIUM CHLORIDE 0.9 % IV SOLN
500.0000 mg/m2 | Freq: Once | INTRAVENOUS | Status: AC
  Administered 2021-07-04: 900 mg via INTRAVENOUS
  Filled 2021-07-04: qty 20

## 2021-07-04 NOTE — Progress Notes (Signed)
?[place] ?[phone]   [phone] ? ?PROGRESS NOTE ? ?Patient Care Team: ?Minette Brine, FNP as PCP - General (General Practice) ? ?Hematological/Oncological History ?# Metastatic Adenocarcinoma of the Lung ?#Brain Metastasis ?1) 11/08/2019: establish care with Dr. Julien Nordmann and his PA [place]. Noted to have widely metastatic lung cancer with involvement of the brain, lymph nodes, and right lung.  ?2) 12/04/2019: palliative radiation to the right lung mass/cervical adenopathy ?3) 9/7/02021: started therapy with Carbo/Pem/Pem ?4) 9/29-10/09/2019: patient underwent SRS to the brain mets ?5) 03/22/2020: repeat CT C/A/P and neck scheduled. Transfer care to Dr. Lorenso Courier.   ?6) 03/22/2020: CT C/A/P showed interval significant improvement in previously demonstrated extensive confluent lymphadenopathy in the superior mediastinum and right hilar regions. ?7) 08/15/2020: Holding chemotherapy due to poor Hgb and fatigue. Allowing patient a brief 'chemo holiday' to rebound appropriately from last cycle.  ?8) 08/29/2020: restarted chemotherapy, Cycle 12 Day 1 ?9) 09/19/2020: patient requested to HOLD Cycle 13 in setting of fatigue/weakness.  ?10) 11/29/2020: Cycle 14 Day 1 of chemotherapy, delayed start due to insurance issues.  ? ?Interval History:  ?Elizabeth Mayer 61 y.o. female with medical history significant for metastatic adenocarcinoma of the lung who presents for follow up. The patient's last visit was on 06/13/2021 when she received Cycle 23, Day 1 of pem/pem maintenance.  In the interim since her last visit she has had no other changed in her health.  ? ?On exam today Elizabeth Mayer reports she has been overall in the interim since her last visit.  She is concerned about a partial ingrown toenail on her great toe.  She notes it is causing discomfort but there is no clear evidence of infection this time.  She denies any discharge or erythema.  She reports that chemotherapy has not  been causing any major symptoms.  She denies any nausea, vomiting, or diarrhea.  She denies fevers, chills, night sweats, chest pain or cough. She has no other complaints.  A full 10 point ROS is listed below. ? ?MEDICAL HISTORY:  ?Past Medical History:  ?Diagnosis Date  ? Anemia   ? Angina 1982  ? related to stress  ? Asthma   ? in the past,  no current problems  ? Complication of anesthesia   ? states anesthesia made her hair fall out  ? Constipation   ? Dyspnea   ? on oxygen at home - 3L via [place]  ? Fatigue   ? GERD (gastroesophageal reflux disease)   ? patient denies this dx  ? Headache   ? Heart murmur 1970s  ? no problems currently  ? Hypertension   ? Ingrown toenail   ? Lung cancer ([place]) 10/2019  ? metastatic disease to the brain  ? On home oxygen therapy   ? 2L via [place] - 24 hours a day  ? Past heart attack 1980-1981  ? pt states she passed out and woke up in hospital- told she had heart attack, but then dr said he couldn't find anything wrong.  ? Pneumonia   ? x 1  ? ? ?SURGICAL HISTORY: ?Past Surgical History:  ?Procedure Laterality Date  ? [place] SURGERY  2000  ? Disc removed from neck   ? ingrown toe nail surgery Bilateral   ? IR IMAGING GUIDED PORT INSERTION  02/16/2020  ? MULTIPLE TOOTH EXTRACTIONS    ? for braces  ? RADIOLOGY WITH ANESTHESIA N/A 12/05/2019  ? Procedure: MRI BRAIN WITH AND WITHOUT CONTRAST;  Surgeon:  Radiologist, Medication, MD;  Location: [place];  Service: Radiology;  Laterality: N/A;  ? RADIOLOGY WITH ANESTHESIA N/A 01/02/2020  ? Procedure: MRI BRAIN WITH AND WITHOUT CONTRAST;  Surgeon: Radiologist, Medication, MD;  Location: [place];  Service: Radiology;  Laterality: N/A;  ? RADIOLOGY WITH ANESTHESIA N/A 02/20/2020  ? Procedure: MRI WITH ANESTHESIA OF BRAIN WITH AND WITHOUT CONTRAST;  Surgeon: Radiologist, Medication, MD;  Location: [place];  Service: Radiology;  Laterality: N/A;  ? RADIOLOGY WITH ANESTHESIA N/A 06/11/2020  ? Procedure: MRI WITH ANESTHESIA OF BRAIN WITH AND WITHOUT  CONTRAST;  Surgeon: Radiologist, Medication, MD;  Location: [place];  Service: Radiology;  Laterality: N/A;  ? RADIOLOGY WITH ANESTHESIA N/A 10/10/2020  ? Procedure: MRI WITH ANESTHESIA BRAIN WITH AND WITHOUT CONTRAST;  Surgeon: Radiologist, Medication, MD;  Location: [place];  Service: Radiology;  Laterality: N/A;  ? RADIOLOGY WITH ANESTHESIA N/A 01/30/2021  ? Procedure: MRI BRAIN WITH AND WITHOUT CONTRASTWITH ANESTHESIA;  Surgeon: Radiologist, Medication, MD;  Location: [place];  Service: Radiology;  Laterality: N/A;  ? RADIOLOGY WITH ANESTHESIA N/A 06/12/2021  ? Procedure: MRI WITH ANESTHESIA OF BRAIN WITH AND WITHOUT CONTRAST;  Surgeon: Radiologist, Medication, MD;  Location: [place];  Service: Radiology;  Laterality: N/A;  ? TONSILLECTOMY    ? ? ?SOCIAL HISTORY: ?Social History  ? ?Socioeconomic History  ? Marital status: Legally Separated  ?  Spouse name: Not on file  ? Number of children: 0  ? Years of education: Not on file  ? Highest education level: Not on file  ?Occupational History  ? Occupation: Secondary school teacher  ?Tobacco Use  ? Smoking status: Former  ?  Packs/day: 1.00  ?  Years: 20.00  ?  Pack years: 20.00  ?  Types: Cigarettes  ?  Start date: 54  ?  Quit date: 2001  ?  Years since quitting: 22.2  ? Smokeless tobacco: Never  ?Vaping Use  ? Vaping Use: Never used  ?Substance and Sexual Activity  ? Alcohol use: No  ? Drug use: No  ? Sexual activity: Yes  ?  Birth control/protection: None, Post-menopausal  ?Other Topics Concern  ? Not on file  ?Social History Narrative  ? Not on file  ? ?Social Determinants of Health  ? ?Financial Resource Strain: Not on file  ?Food Insecurity: Not on file  ?Transportation Needs: Not on file  ?Physical Activity: Not on file  ?Stress: Not on file  ?Social Connections: Not on file  ?Intimate Partner Violence: Not on file  ? ? ?FAMILY HISTORY: ?Family History  ?Problem Relation Age of Onset  ? Arthritis Mother   ? Heart failure Father   ? ? ?ALLERGIES:  is allergic to  pseudoephedrine, gabapentin, mobic [meloxicam], penicillins, and prednisone. ? ?MEDICATIONS:  ?Current Outpatient Medications  ?Medication Sig Dispense Refill  ? acetaminophen (TYLENOL) 500 MG tablet Take 1,000 mg by mouth 2 (two) times daily as needed (pain.).    ? albuterol (VENTOLIN HFA) 108 (90 Base) MCG/ACT inhaler Inhale 1-2 puffs into the lungs See admin instructions. Use 1 inhalation twice daily (scheduled) & may use every 6 hours as needed for wheezing/shortness of breath    ? amLODipine (NORVASC) 5 MG tablet Take 1 tablet (5 mg total) by mouth daily. 30 tablet 2  ? benzoyl peroxide (CERAVE ACNE FOAMING CREAM) 4 % external liquid Apply 1 application topically 2 (two) times daily. Face    ? bisacodyl (DULCOLAX) 5 MG EC tablet Take 5 mg by mouth daily as needed for moderate  constipation.    ? Blood Pressure Monitoring (BLOOD PRESSURE MONITOR AUTOMAT) DEVI Check Blood pressure at home daily 1 each 0  ? Carboxymethylcellulose Sodium (REFRESH CELLUVISC OP) Place 2 drops into both eyes in the morning and at bedtime.    ? cetaphil (CETAPHIL) lotion Apply 1 application. topically as needed for dry skin.    ? clindamycin (CLEOCIN T) 1 % lotion Apply 1 application topically 2 (two) times daily.    ? cyclobenzaprine (FLEXERIL) 10 MG tablet Take 1 tablet (10 mg total) by mouth 3 (three) times daily as needed for muscle spasms. 30 tablet 0  ? diltiazem (CARDIZEM) 30 MG tablet Take 1 tablet (30 mg total) by mouth 4 (four) times daily. (Patient taking differently: Take 30 mg by mouth 4 (four) times daily as needed (afib episodes).) 120 tablet 1  ? docusate sodium (COLACE) 100 MG capsule Take 100 mg by mouth 2 (two) times daily as needed for moderate constipation.    ? fexofenadine (ALLEGRA) 180 MG tablet Take 180 mg by mouth in the morning.    ? fluticasone (FLONASE) 50 MCG/ACT nasal spray Place 1 spray into both nostrils daily.    ? folic acid (FOLVITE) 1 MG tablet Take 1 tablet by mouth once daily 90 tablet 0  ?  guaiFENesin (MUCINEX) 600 MG 12 hr tablet Take 600 mg by mouth 2 (two) times daily.     ? HYDROcodone-acetaminophen (NORCO/VICODIN) 5-325 MG tablet Take 1 tablet by mouth every 6 (six) hours as needed for moderate pain. 30 tabl

## 2021-07-04 NOTE — Patient Instructions (Signed)
[facility]  Discharge Instructions: Thank you for choosing [facility] to provide your oncology and hematology care.   If you have a lab appointment with the Cancer Center, please go directly to the Cancer Center and check in at the registration area.   Wear comfortable clothing and clothing appropriate for easy access to any Portacath or PICC line.   We strive to give you quality time with your provider. You may need to reschedule your appointment if you arrive late (15 or more minutes).  Arriving late affects you and other patients whose appointments are after yours.  Also, if you miss three or more appointments without notifying the office, you may be dismissed from the clinic at the provider's discretion.      For prescription refill requests, have your pharmacy contact our office and allow 72 hours for refills to be completed.    Today you received the following chemotherapy and/or immunotherapy agents Pembrolizumab (Keytruda) and Pemetrexed (Alimta)      To help prevent nausea and vomiting after your treatment, we encourage you to take your nausea medication as directed.  BELOW ARE SYMPTOMS THAT SHOULD BE REPORTED IMMEDIATELY: *FEVER GREATER THAN 100.4 F (38 C) OR HIGHER *CHILLS OR SWEATING *NAUSEA AND VOMITING THAT IS NOT CONTROLLED WITH YOUR NAUSEA MEDICATION *UNUSUAL SHORTNESS OF BREATH *UNUSUAL BRUISING OR BLEEDING *URINARY PROBLEMS (pain or burning when urinating, or frequent urination) *BOWEL PROBLEMS (unusual diarrhea, constipation, pain near the anus) TENDERNESS IN MOUTH AND THROAT WITH OR WITHOUT PRESENCE OF ULCERS (sore throat, sores in mouth, or a toothache) UNUSUAL RASH, SWELLING OR PAIN  UNUSUAL VAGINAL DISCHARGE OR ITCHING   Items with * indicate a potential emergency and should be followed up as soon as possible or go to the Emergency Department if any problems should occur.  Please show the CHEMOTHERAPY ALERT CARD or  IMMUNOTHERAPY ALERT CARD at check-in to the Emergency Department and triage nurse.  Should you have questions after your visit or need to cancel or reschedule your appointment, please contact [facility]  Dept: [phone]  and follow the prompts.  Office hours are 8:00 a.m. to 4:30 p.m. Monday - Friday. Please note that voicemails left after 4:00 p.m. may not be returned until the following business day.  We are closed weekends and major holidays. You have access to a nurse at all times for urgent questions. Please call the main number to the clinic Dept: [phone] and follow the prompts.   For any non-urgent questions, you may also contact your provider using MyChart. We now offer e-Visits for anyone 18 and older to request care online for non-urgent symptoms. For details visit [email or web address].   Also download the MyChart app! Go to the app store, search "MyChart", open the app, select [facility], and log in with your MyChart username and password.  Due to Covid, a mask is required upon entering the hospital/clinic. If you do not have a mask, one will be given to you upon arrival. For doctor visits, patients may have 1 support person aged 18 or older with them. For treatment visits, patients cannot have anyone with them due to current Covid guidelines and our immunocompromised population.   

## 2021-07-06 ENCOUNTER — Encounter: Payer: Self-pay | Admitting: Physician Assistant

## 2021-07-08 ENCOUNTER — Ambulatory Visit (HOSPITAL_COMMUNITY): Payer: Medicaid Other | Admitting: Nurse Practitioner

## 2021-07-13 ENCOUNTER — Encounter: Payer: Self-pay | Admitting: Hematology and Oncology

## 2021-07-13 ENCOUNTER — Encounter: Payer: Self-pay | Admitting: Physician Assistant

## 2021-07-15 ENCOUNTER — Encounter: Payer: Self-pay | Admitting: Family Medicine

## 2021-07-15 ENCOUNTER — Ambulatory Visit (INDEPENDENT_AMBULATORY_CARE_PROVIDER_SITE_OTHER): Payer: Medicaid Other | Admitting: Family Medicine

## 2021-07-15 VITALS — BP 131/78 | HR 91 | Wt 145.5 lb

## 2021-07-15 DIAGNOSIS — D219 Benign neoplasm of connective and other soft tissue, unspecified: Secondary | ICD-10-CM | POA: Diagnosis not present

## 2021-07-15 NOTE — Progress Notes (Signed)
? ?GYNECOLOGY OFFICE VISIT NOTE ? ?History:  ? Elizabeth Mayer is a 61 y.o. No obstetric history on file. here today for pelvic pain. ? ?Dorene Sorrow about a year and a half ago for her metastatic lung cancer ?Since that time has noted increased pain in her pelvis ?Sometimes centrally located, but often moves  ?Pain comes and goes ?She does chemo every three weeks, and feels like that when she has the most symptoms ?No vaginal bleeding, went through menopause about 10 years ago ?She is worried that her chemotherapy might be causing her pain ? ?Health Maintenance Due  ?Topic Date Due  ? COVID-19 Vaccine (1) Never done  ? Zoster Vaccines- Shingrix (1 of 2) Never done  ? PAP SMEAR-Modifier  Never done  ? ? ?Past Medical History:  ?Diagnosis Date  ? Anemia   ? Angina 1982  ? related to stress  ? Asthma   ? in the past,  no current problems  ? Complication of anesthesia   ? states anesthesia made her hair fall out  ? Constipation   ? Dyspnea   ? on oxygen at home - 3L via [place]  ? Fatigue   ? GERD (gastroesophageal reflux disease)   ? patient denies this dx  ? Headache   ? Heart murmur 1970s  ? no problems currently  ? Hypertension   ? Ingrown toenail   ? Lung cancer ([place]) 10/2019  ? metastatic disease to the brain  ? On home oxygen therapy   ? 2L via [place] - 24 hours a day  ? Past heart attack 1980-1981  ? pt states she passed out and woke up in hospital- told she had heart attack, but then dr said he couldn't find anything wrong.  ? Pneumonia   ? x 1  ? ? ?Past Surgical History:  ?Procedure Laterality Date  ? [place] SURGERY  2000  ? Disc removed from neck   ? ingrown toe nail surgery Bilateral   ? IR IMAGING GUIDED PORT INSERTION  02/16/2020  ? MULTIPLE TOOTH EXTRACTIONS    ? for braces  ? RADIOLOGY WITH ANESTHESIA N/A 12/05/2019  ? Procedure: MRI BRAIN WITH AND WITHOUT CONTRAST;  Surgeon: Radiologist, Medication, MD;  Location: [place];  Service: Radiology;  Laterality: N/A;  ? RADIOLOGY WITH ANESTHESIA N/A  01/02/2020  ? Procedure: MRI BRAIN WITH AND WITHOUT CONTRAST;  Surgeon: Radiologist, Medication, MD;  Location: [place];  Service: Radiology;  Laterality: N/A;  ? RADIOLOGY WITH ANESTHESIA N/A 02/20/2020  ? Procedure: MRI WITH ANESTHESIA OF BRAIN WITH AND WITHOUT CONTRAST;  Surgeon: Radiologist, Medication, MD;  Location: [place];  Service: Radiology;  Laterality: N/A;  ? RADIOLOGY WITH ANESTHESIA N/A 06/11/2020  ? Procedure: MRI WITH ANESTHESIA OF BRAIN WITH AND WITHOUT CONTRAST;  Surgeon: Radiologist, Medication, MD;  Location: [place];  Service: Radiology;  Laterality: N/A;  ? RADIOLOGY WITH ANESTHESIA N/A 10/10/2020  ? Procedure: MRI WITH ANESTHESIA BRAIN WITH AND WITHOUT CONTRAST;  Surgeon: Radiologist, Medication, MD;  Location: [place];  Service: Radiology;  Laterality: N/A;  ? RADIOLOGY WITH ANESTHESIA N/A 01/30/2021  ? Procedure: MRI BRAIN WITH AND WITHOUT CONTRASTWITH ANESTHESIA;  Surgeon: Radiologist, Medication, MD;  Location: [place];  Service: Radiology;  Laterality: N/A;  ? RADIOLOGY WITH ANESTHESIA N/A 06/12/2021  ? Procedure: MRI WITH ANESTHESIA OF BRAIN WITH AND WITHOUT CONTRAST;  Surgeon: Radiologist, Medication, MD;  Location: [place];  Service: Radiology;  Laterality: N/A;  ? TONSILLECTOMY    ? ? ?The  following portions of the patient's history were reviewed and updated as appropriate: allergies, current medications, past family history, past medical history, past social history, past surgical history and problem list.  ? ?Health Maintenance:   ?Last pap: ?No results found for: DIAGPAP, HPV, HPVHIGH ?None on file ? ?Last mammogram:  ?12/26/2020 - BIRADS-1  ? ?Review of Systems:  ?Pertinent items noted in HPI and remainder of comprehensive ROS otherwise negative. ? ?Physical Exam:  ?BP 131/78   Pulse 91   Wt 145 lb 8 oz (66 kg)   LMP  (LMP Unknown)   BMI 24.21 kg/m?  ?CONSTITUTIONAL: Well-developed, well-nourished female in no acute distress.  ?HEENT:  Normocephalic, atraumatic. External right and left ear  normal. No scleral icterus.  ?NECK: Normal range of motion, supple, no masses noted on observation ?SKIN: No rash noted. Not diaphoretic. No erythema. No pallor. ?MUSCULOSKELETAL: Normal range of motion. No edema noted. ?NEUROLOGIC: Alert and oriented to person, place, and time. Normal muscle tone coordination.  ?PSYCHIATRIC: Normal mood and affect. Normal behavior. Normal judgment and thought content. ?RESPIRATORY: Effort normal, no problems with respiration noted ? ?Labs and Imaging ?No results found for this or any previous visit (from the past 168 hour(s)). ?LONG TERM MONITOR (3-14 DAYS) ? ?Result Date: 06/20/2021 ?Patch Wear Time:  13 days and 18 hours Predominant rhythm was sinus rhythm Less than 1% ventricular and supraventricular ectopy 28 SVT runs, longest 2 minutes 53 seconds at 148 bpm Some episodes of SVT could potentially be atrial fibrillation but difficult to assess due to baseline wander All episodes are less than 3 minutes Will Camnitz, MD     ?Assessment and Plan:  ? ?Problem List Items Addressed This Visit   ? ?  ? Other  ? Fibroids - Primary  ?  Images reviewed with patient. Though she certainly has some degree of fibroid burden they are by no means massive. That being said they still could be causing her symptoms, but I don't think her Beryle Flock is involved in any way. She also does not have intra-abdominal mets that could be cause of her pain, intestinal obstructions, etc. Regardless she is a poor candidate for most interventions from a surgical perspective but perhaps may be a candidate for uterine artery embolization. She accepts referral today, with understanding that they may have to decline or defer due to her comorbidities. ? ?Also noted during visit to have no pap on file. Agrees to return in 4 weeks for pap.  ?  ?  ? Relevant Orders  ? Ambulatory referral to Interventional Radiology  ? ? ?Routine preventative health maintenance measures emphasized. ?Please refer to After Visit Summary for  other counseling recommendations.  ? ?Return in about 1 month (around 08/14/2021), or if symptoms worsen or fail to improve, for repeat pap.   ? ?Total face-to-face time with patient: 20 minutes.  Over 50% of encounter was spent on counseling and coordination of care. ? ? ?Clarnce Flock, MD/MPH ?Attending Family Medicine Physician, Faculty Practice ?[facility] ? ?

## 2021-07-15 NOTE — Assessment & Plan Note (Addendum)
Images reviewed with patient. Though she certainly has some degree of fibroid burden they are by no means massive. That being said they still could be causing her symptoms, but I don't think her Beryle Flock is involved in any way. She also does not have intra-abdominal mets that could be cause of her pain, intestinal obstructions, etc. Regardless she is a poor candidate for most interventions from a surgical perspective but perhaps may be a candidate for uterine artery embolization. She accepts referral today, with understanding that they may have to decline or defer due to her comorbidities. ? ?Also noted during visit to have no pap on file. Agrees to return in 4 weeks for pap.  ?

## 2021-07-16 ENCOUNTER — Ambulatory Visit (INDEPENDENT_AMBULATORY_CARE_PROVIDER_SITE_OTHER): Payer: Medicaid Other | Admitting: Podiatry

## 2021-07-16 ENCOUNTER — Encounter: Payer: Self-pay | Admitting: Podiatry

## 2021-07-16 DIAGNOSIS — L03031 Cellulitis of right toe: Secondary | ICD-10-CM | POA: Diagnosis not present

## 2021-07-16 DIAGNOSIS — M2041 Other hammer toe(s) (acquired), right foot: Secondary | ICD-10-CM

## 2021-07-16 DIAGNOSIS — M2042 Other hammer toe(s) (acquired), left foot: Secondary | ICD-10-CM

## 2021-07-16 NOTE — Progress Notes (Signed)
Subjective:  ? ?Patient ID: Elizabeth Mayer, female   DOB: 61 y.o.   MRN: [id number]  ? ?HPI ?Patient presents with drainage in the right hallux medial side with patient undergoing chemo and also concerned about digital deformities ? ? ?ROS ? ? ?   ?Objective:  ?Physical Exam  ?Neurovascular status unchanged with patient found to have moderate digital deformities bilateral utilizing chemo for close to 3 years for chronic lung cancer and yellow discoloration of the right hallux medial border localized ? ?   ?Assessment:  ?Paronychia infection of the right hallux medial border along with digital deformities ? ?   ?Plan:  ?Do not recommend treatment for digital deformities did discuss and educate on hammertoes and the wearing of soft type shoes and no compression.  Reappoint for that as needed and today I anesthetized the right hallux 60 mg like Marcaine mixture sterile prep done to the toe and using sterile instrumentation removed tissue on the medial side and opened up the base flushed it and applied sterile dressing and begin soaks.  Should heal uneventfully ?   ? ? ?

## 2021-07-16 NOTE — Patient Instructions (Signed)

## 2021-07-17 ENCOUNTER — Ambulatory Visit (HOSPITAL_COMMUNITY)
Admission: RE | Admit: 2021-07-17 | Discharge: 2021-07-17 | Disposition: A | Discharge status: Home / Self Care | Payer: Medicaid Other | Source: Ambulatory Visit | Attending: Nurse Practitioner | Admitting: Nurse Practitioner

## 2021-07-17 ENCOUNTER — Encounter (HOSPITAL_COMMUNITY): Payer: Self-pay | Admitting: Nurse Practitioner

## 2021-07-17 ENCOUNTER — Telehealth: Payer: Self-pay | Admitting: *Deleted

## 2021-07-17 VITALS — BP 148/72 | HR 83 | Ht 65.0 in | Wt 148.4 lb

## 2021-07-17 DIAGNOSIS — I480 Paroxysmal atrial fibrillation: Secondary | ICD-10-CM | POA: Diagnosis not present

## 2021-07-17 DIAGNOSIS — I4892 Unspecified atrial flutter: Secondary | ICD-10-CM | POA: Insufficient documentation

## 2021-07-17 DIAGNOSIS — I471 Supraventricular tachycardia: Secondary | ICD-10-CM | POA: Diagnosis not present

## 2021-07-17 DIAGNOSIS — Z7952 Long term (current) use of systemic steroids: Secondary | ICD-10-CM | POA: Insufficient documentation

## 2021-07-17 DIAGNOSIS — R002 Palpitations: Secondary | ICD-10-CM | POA: Insufficient documentation

## 2021-07-17 DIAGNOSIS — J9611 Chronic respiratory failure with hypoxia: Secondary | ICD-10-CM | POA: Diagnosis not present

## 2021-07-17 DIAGNOSIS — Z85118 Personal history of other malignant neoplasm of bronchus and lung: Secondary | ICD-10-CM | POA: Insufficient documentation

## 2021-07-17 DIAGNOSIS — Z8249 Family history of ischemic heart disease and other diseases of the circulatory system: Secondary | ICD-10-CM | POA: Diagnosis not present

## 2021-07-17 DIAGNOSIS — Z79899 Other long term (current) drug therapy: Secondary | ICD-10-CM | POA: Insufficient documentation

## 2021-07-17 DIAGNOSIS — Z9981 Dependence on supplemental oxygen: Secondary | ICD-10-CM | POA: Insufficient documentation

## 2021-07-17 DIAGNOSIS — I4891 Unspecified atrial fibrillation: Secondary | ICD-10-CM | POA: Diagnosis not present

## 2021-07-17 DIAGNOSIS — R9431 Abnormal electrocardiogram [ECG] [EKG]: Secondary | ICD-10-CM | POA: Diagnosis not present

## 2021-07-17 NOTE — Progress Notes (Signed)
? ?Primary Care Physician: Minette Brine, FNP ?Referring Physician: ED f/u  ?Oncologist: Dr. Lorenso Courier  ? ? ?Elizabeth Mayer is a 61 y.o. female with a h/o  non-small cell lung cancer, chronic hypoxic respiratory failure on oxygen who presented to ED with palpitations.  Patient reported that she woke up this morning around 3 AM with palpitations.  She was scheduled to have a CT scan later this morning and has been taking prednisone for pretreatment. She took 1 at 9 PM and woke up this morning at 3 AM to take her prednisone.  At that time she had onset of heart racing.  No chest pain or shortness of breath. EKG showed probable typical atrail flutter at 138 bpm.   She has a history of atrial fibrillation remotely but is not currently undergoing any treatment and does not need blood thinners. She did convert in the ER with Cardizem and with a CHA2DS2VASc of 1, anticoagulation was not started. In the afib clinic today, she is in [place] and has not noted any further afib.  ? ?F/u in afib clinic, 05/08/21. She is in SR now, but went into afib after chemo tx on Friday. HR averaged in the 90's. She feels she went into SR just a few hours earlier. Her first  episode was probably triggered by prednisone use. She did use her 30 mg Cardizem during episode.  ? ?F/u in afib clinic, 05/27/21. She  is in SR but again feels that she went into afib after her chemo on Friday and went back into SR this am. She is using the Cardizem 30 mg as needed for afib which she states slows it down and it does not to seem to bother her as much. She states that she is not tolerating her chemo well.  ? ?F/u in afib clinic, 07/17/21. She states that arrhythmia has improved since prednisone has washed out of system. She appears to be tolerating chemo  better as well. Recent monitor showed predominate rhythm of SR with with 28 episodes of SVT, some of which could be afib but all episodes were less than 3 mins.  ? ?Today, she denies symptoms of palpitations,  chest pain, shortness of breath, orthopnea, PND, lower extremity edema, dizziness, presyncope, syncope, or neurologic sequela. The patient is tolerating medications without difficulties and is otherwise without complaint today.  ? ?Past Medical History:  ?Diagnosis Date  ? Anemia   ? Angina 1982  ? related to stress  ? Asthma   ? in the past,  no current problems  ? Complication of anesthesia   ? states anesthesia made her hair fall out  ? Constipation   ? Dyspnea   ? on oxygen at home - 3L via [place]  ? Fatigue   ? GERD (gastroesophageal reflux disease)   ? patient denies this dx  ? Headache   ? Heart murmur 1970s  ? no problems currently  ? Hypertension   ? Ingrown toenail   ? Lung cancer ([place]) 10/2019  ? metastatic disease to the brain  ? On home oxygen therapy   ? 2L via [place] - 24 hours a day  ? Past heart attack 1980-1981  ? pt states she passed out and woke up in hospital- told she had heart attack, but then dr said he couldn't find anything wrong.  ? Pneumonia   ? x 1  ? ?Past Surgical History:  ?Procedure Laterality Date  ? [place] SURGERY  2000  ? Disc removed from  neck   ? ingrown toe nail surgery Bilateral   ? IR IMAGING GUIDED PORT INSERTION  02/16/2020  ? MULTIPLE TOOTH EXTRACTIONS    ? for braces  ? RADIOLOGY WITH ANESTHESIA N/A 12/05/2019  ? Procedure: MRI BRAIN WITH AND WITHOUT CONTRAST;  Surgeon: Radiologist, Medication, MD;  Location: [place];  Service: Radiology;  Laterality: N/A;  ? RADIOLOGY WITH ANESTHESIA N/A 01/02/2020  ? Procedure: MRI BRAIN WITH AND WITHOUT CONTRAST;  Surgeon: Radiologist, Medication, MD;  Location: [place];  Service: Radiology;  Laterality: N/A;  ? RADIOLOGY WITH ANESTHESIA N/A 02/20/2020  ? Procedure: MRI WITH ANESTHESIA OF BRAIN WITH AND WITHOUT CONTRAST;  Surgeon: Radiologist, Medication, MD;  Location: [place];  Service: Radiology;  Laterality: N/A;  ? RADIOLOGY WITH ANESTHESIA N/A 06/11/2020  ? Procedure: MRI WITH ANESTHESIA OF BRAIN WITH AND WITHOUT CONTRAST;  Surgeon:  Radiologist, Medication, MD;  Location: [place];  Service: Radiology;  Laterality: N/A;  ? RADIOLOGY WITH ANESTHESIA N/A 10/10/2020  ? Procedure: MRI WITH ANESTHESIA BRAIN WITH AND WITHOUT CONTRAST;  Surgeon: Radiologist, Medication, MD;  Location: [place];  Service: Radiology;  Laterality: N/A;  ? RADIOLOGY WITH ANESTHESIA N/A 01/30/2021  ? Procedure: MRI BRAIN WITH AND WITHOUT CONTRASTWITH ANESTHESIA;  Surgeon: Radiologist, Medication, MD;  Location: [place];  Service: Radiology;  Laterality: N/A;  ? RADIOLOGY WITH ANESTHESIA N/A 06/12/2021  ? Procedure: MRI WITH ANESTHESIA OF BRAIN WITH AND WITHOUT CONTRAST;  Surgeon: Radiologist, Medication, MD;  Location: [place];  Service: Radiology;  Laterality: N/A;  ? TONSILLECTOMY    ? ? ?Current Outpatient Medications  ?Medication Sig Dispense Refill  ? acetaminophen (TYLENOL) 500 MG tablet Take 1,000 mg by mouth 2 (two) times daily as needed (pain.).    ? albuterol (VENTOLIN HFA) 108 (90 Base) MCG/ACT inhaler Inhale 1-2 puffs into the lungs See admin instructions. Use 1 inhalation twice daily (scheduled) & may use every 6 hours as needed for wheezing/shortness of breath    ? amLODipine (NORVASC) 5 MG tablet Take 1 tablet (5 mg total) by mouth daily. 30 tablet 2  ? benzoyl peroxide (CERAVE ACNE FOAMING CREAM) 4 % external liquid Apply 1 application topically 2 (two) times daily. Face    ? bisacodyl (DULCOLAX) 5 MG EC tablet Take 5 mg by mouth daily as needed for moderate constipation.    ? Blood Pressure Monitoring (BLOOD PRESSURE MONITOR AUTOMAT) DEVI Check Blood pressure at home daily 1 each 0  ? Carboxymethylcellulose Sodium (REFRESH CELLUVISC OP) Place 2 drops into both eyes in the morning and at bedtime.    ? cetaphil (CETAPHIL) lotion Apply 1 application. topically as needed for dry skin.    ? clindamycin (CLEOCIN T) 1 % lotion Apply 1 application topically 2 (two) times daily.    ? cyclobenzaprine (FLEXERIL) 10 MG tablet Take 1 tablet (10 mg total) by mouth 3 (three) times  daily as needed for muscle spasms. 30 tablet 0  ? diltiazem (CARDIZEM) 30 MG tablet Take 1 tablet (30 mg total) by mouth 4 (four) times daily. (Patient taking differently: Take 30 mg by mouth 4 (four) times daily as needed (afib episodes).) 120 tablet 1  ? docusate sodium (COLACE) 100 MG capsule Take 100 mg by mouth 2 (two) times daily as needed for moderate constipation.    ? fexofenadine (ALLEGRA) 180 MG tablet Take 180 mg by mouth in the morning.    ? fluticasone (FLONASE) 50 MCG/ACT nasal spray Place 1 spray into both nostrils daily.    ? folic  acid (FOLVITE) 1 MG tablet Take 1 tablet by mouth once daily 90 tablet 0  ? guaiFENesin (MUCINEX) 600 MG 12 hr tablet Take 600 mg by mouth 2 (two) times daily.     ? HYDROcodone-acetaminophen (NORCO/VICODIN) 5-325 MG tablet Take 1 tablet by mouth every 6 (six) hours as needed for moderate pain. 30 tablet 0  ? lidocaine (XYLOCAINE) 2 % solution Use as directed 15 mLs in the mouth or throat as needed for mouth pain. Swallow 30 min prior to meals and at bedtime for throat/esophagus pain 250 mL 1  ? lidocaine-prilocaine (EMLA) cream Apply 1 application topically as needed. (Patient taking differently: Apply 1 application. topically as needed (port access). Every three weeks) 30 g 2  ? Multiple Vitamin (MULTIVITAMIN WITH MINERALS) TABS tablet Take 2 tablets by mouth daily. Alive/ gummies    ? ondansetron (ZOFRAN) 8 MG tablet Take 1 tablet (8 mg total) by mouth every 8 (eight) hours as needed for nausea or vomiting. 20 tablet 0  ? OXYGEN Inhale 3 L/min into the lungs See admin instructions. At bedtime & as needed during the day    ? Pembrolizumab (KEYTRUDA IV) Inject into the vein. Every 3 weeks    ? PROAIR HFA 108 (90 Base) MCG/ACT inhaler Inhale 1-2 puffs into the lungs every 6 (six) hours as needed for wheezing or shortness of breath. 18 g 3  ? Soap & Cleansers (MEDERMA AG FACIAL TONER) LIQD Apply 1 application topically 2 (two) times daily. Advance dry skin    ? SYMBICORT  80-4.5 MCG/ACT inhaler INHALE 2 PUFFS BY MOUTH IN THE MORNING AND AT BEDTIME 11 g 5  ? triamcinolone ointment (KENALOG) 0.1 % Apply 1 application topically in the morning and at bedtime.    ? ?No current facility-ad

## 2021-07-17 NOTE — Telephone Encounter (Signed)
Patient is calling because she wanted to know if she should be using an antibiotic ointment.  ?Returned the call to patient and went over the instructions given at time of visit, explained to her to only use triple antibiotic ointment  if the physician recommends after procedure. She verbalized understanding. ?

## 2021-07-18 ENCOUNTER — Telehealth: Payer: Self-pay | Admitting: *Deleted

## 2021-07-18 ENCOUNTER — Other Ambulatory Visit: Payer: Self-pay | Admitting: Podiatry

## 2021-07-18 MED ORDER — DOXYCYCLINE HYCLATE 100 MG PO TABS: 100.0000 mg | ORAL_TABLET | Freq: Two times a day (BID) | ORAL | 1 refills | Status: DC

## 2021-07-18 NOTE — Telephone Encounter (Signed)
Patient is calling because toe is now oozing pus since 1 day ago. She is requesting an antibiotic.Please advise. ?

## 2021-07-18 NOTE — Telephone Encounter (Signed)
Sent in antibiotic

## 2021-07-21 ENCOUNTER — Ambulatory Visit (HOSPITAL_COMMUNITY): Admission: RE | Admit: 2021-07-21 | Payer: Medicaid Other | Source: Ambulatory Visit

## 2021-07-23 ENCOUNTER — Telehealth: Payer: Self-pay

## 2021-07-23 ENCOUNTER — Ambulatory Visit (HOSPITAL_COMMUNITY)
Admission: RE | Admit: 2021-07-23 | Discharge: 2021-07-23 | Disposition: A | Discharge status: Home / Self Care | Payer: Medicaid Other | Source: Ambulatory Visit | Attending: Hematology and Oncology | Admitting: Hematology and Oncology

## 2021-07-23 DIAGNOSIS — C3491 Malignant neoplasm of unspecified part of right bronchus or lung: Secondary | ICD-10-CM | POA: Diagnosis present

## 2021-07-23 MED ORDER — IOHEXOL 300 MG/ML  SOLN
80.0000 mL | Freq: Once | INTRAMUSCULAR | Status: AC | PRN
  Administered 2021-07-23: 80 mL via INTRAVENOUS

## 2021-07-23 MED ORDER — HEPARIN SOD (PORK) LOCK FLUSH 100 UNIT/ML IV SOLN: 500.0000 [IU] | Freq: Once | INTRAVENOUS | Status: AC

## 2021-07-23 MED ORDER — HEPARIN SOD (PORK) LOCK FLUSH 100 UNIT/ML IV SOLN
INTRAVENOUS | Status: AC
  Administered 2021-07-23: 500 [IU] via INTRAVENOUS
  Filled 2021-07-23: qty 5

## 2021-07-23 NOTE — Telephone Encounter (Signed)
Refill request received via fax from [facility] for folic acid 1 mg PO daily #90 ? ?Refill req faxed back to [phone]  authorized for refill by Dede Query, PA-C ?

## 2021-07-24 ENCOUNTER — Telehealth: Payer: Self-pay

## 2021-07-24 NOTE — Telephone Encounter (Signed)
Notified patient of message below.

## 2021-07-24 NOTE — Telephone Encounter (Signed)
-----   Message from Orson Slick, MD sent at 07/24/2021  9:32 AM EDT ----- ?Please let Elizabeth Mayer know that the CT scan of the chest abdomen pelvis showed stable disease.  There is no evidence of progressive lung cancer or new sites of disease.  We will continue her on her chemotherapy regimen as scheduled. ?----- Message ----- ?From: Interface, Rad Results In ?Sent: 07/24/2021   9:31 AM EDT ?To: Orson Slick, MD ? ? ?

## 2021-07-25 ENCOUNTER — Inpatient Hospital Stay: Payer: Medicaid Other | Attending: Internal Medicine

## 2021-07-25 ENCOUNTER — Other Ambulatory Visit: Payer: Self-pay

## 2021-07-25 ENCOUNTER — Inpatient Hospital Stay (HOSPITAL_BASED_OUTPATIENT_CLINIC_OR_DEPARTMENT_OTHER): Payer: Medicaid Other | Admitting: Hematology and Oncology

## 2021-07-25 ENCOUNTER — Inpatient Hospital Stay: Payer: Medicaid Other

## 2021-07-25 ENCOUNTER — Other Ambulatory Visit: Payer: Self-pay | Admitting: Hematology and Oncology

## 2021-07-25 VITALS — BP 147/65 | HR 88 | Temp 97.7°F | Resp 16 | Ht 65.0 in | Wt 147.8 lb

## 2021-07-25 DIAGNOSIS — Z95828 Presence of other vascular implants and grafts: Secondary | ICD-10-CM

## 2021-07-25 DIAGNOSIS — D6481 Anemia due to antineoplastic chemotherapy: Secondary | ICD-10-CM | POA: Insufficient documentation

## 2021-07-25 DIAGNOSIS — C3491 Malignant neoplasm of unspecified part of right bronchus or lung: Secondary | ICD-10-CM

## 2021-07-25 DIAGNOSIS — I10 Essential (primary) hypertension: Secondary | ICD-10-CM | POA: Diagnosis not present

## 2021-07-25 DIAGNOSIS — Z79899 Other long term (current) drug therapy: Secondary | ICD-10-CM | POA: Diagnosis not present

## 2021-07-25 DIAGNOSIS — M549 Dorsalgia, unspecified: Secondary | ICD-10-CM | POA: Insufficient documentation

## 2021-07-25 DIAGNOSIS — C7931 Secondary malignant neoplasm of brain: Secondary | ICD-10-CM

## 2021-07-25 DIAGNOSIS — R5383 Other fatigue: Secondary | ICD-10-CM | POA: Insufficient documentation

## 2021-07-25 DIAGNOSIS — Z87891 Personal history of nicotine dependence: Secondary | ICD-10-CM | POA: Insufficient documentation

## 2021-07-25 DIAGNOSIS — R42 Dizziness and giddiness: Secondary | ICD-10-CM | POA: Insufficient documentation

## 2021-07-25 LAB — CMP (CANCER CENTER ONLY)
ALT: 12 U/L (ref 0–44)
AST: 22 U/L (ref 15–41)
Albumin: 3.5 g/dL (ref 3.5–5.0)
Alkaline Phosphatase: 47 U/L (ref 38–126)
Anion gap: 8 (ref 5–15)
BUN: 21 mg/dL (ref 8–23)
CO2: 30 mmol/L (ref 22–32)
Calcium: 9 mg/dL (ref 8.9–10.3)
Chloride: 105 mmol/L (ref 98–111)
Creatinine: 1.41 mg/dL — ABNORMAL HIGH (ref 0.44–1.00)
GFR, Estimated: 42 mL/min — ABNORMAL LOW (ref >60)
Glucose, Bld: 83 mg/dL (ref 70–99)
Potassium: 3.4 mmol/L — ABNORMAL LOW (ref 3.5–5.1)
Sodium: 143 mmol/L (ref 135–145)
Total Bilirubin: 0.4 mg/dL (ref 0.3–1.2)
Total Protein: 6.7 g/dL (ref 6.5–8.1)

## 2021-07-25 LAB — CBC WITH DIFFERENTIAL (CANCER CENTER ONLY)
Abs Immature Granulocytes: 0.01 10*3/uL (ref 0.00–0.07)
Basophils Absolute: 0 10*3/uL (ref 0.0–0.1)
Basophils Relative: 1 %
Eosinophils Absolute: 0.1 10*3/uL (ref 0.0–0.5)
Eosinophils Relative: 2 %
HCT: 28.5 % — ABNORMAL LOW (ref 36.0–46.0)
Hemoglobin: 9.5 g/dL — ABNORMAL LOW (ref 12.0–15.0)
Immature Granulocytes: 0 %
Lymphocytes Relative: 21 %
Lymphs Abs: 0.9 10*3/uL (ref 0.7–4.0)
MCH: 32.1 pg (ref 26.0–34.0)
MCHC: 33.3 g/dL (ref 30.0–36.0)
MCV: 96.3 fL (ref 80.0–100.0)
Monocytes Absolute: 0.6 10*3/uL (ref 0.1–1.0)
Monocytes Relative: 15 %
Neutro Abs: 2.7 10*3/uL (ref 1.7–7.7)
Neutrophils Relative %: 61 %
Platelet Count: 173 10*3/uL (ref 150–400)
RBC: 2.96 MIL/uL — ABNORMAL LOW (ref 3.87–5.11)
RDW: 14.4 % (ref 11.5–15.5)
WBC Count: 4.3 10*3/uL (ref 4.0–10.5)
nRBC: 0 % (ref 0.0–0.2)

## 2021-07-25 LAB — TSH: TSH: 1.438 u[IU]/mL (ref 0.350–4.500)

## 2021-07-25 MED ORDER — FOLIC ACID 1 MG PO TABS: 1.0000 mg | ORAL_TABLET | Freq: Every day | ORAL | 1 refills | Status: DC

## 2021-07-25 MED ORDER — HEPARIN SOD (PORK) LOCK FLUSH 100 UNIT/ML IV SOLN
500.0000 [IU] | Freq: Once | INTRAVENOUS | Status: AC
  Administered 2021-07-25: 500 [IU] via INTRAVENOUS

## 2021-07-25 MED ORDER — SODIUM CHLORIDE 0.9% FLUSH
10.0000 mL | Freq: Once | INTRAVENOUS | Status: AC
  Administered 2021-07-25: 10 mL via INTRAVENOUS

## 2021-07-25 MED ORDER — SODIUM CHLORIDE 0.9% FLUSH
10.0000 mL | Freq: Once | INTRAVENOUS | Status: AC
  Administered 2021-07-25: 10 mL

## 2021-07-25 NOTE — Progress Notes (Signed)
?Elizabeth Mayer ?[phone]   [phone] ? ?PROGRESS NOTE ? ?Patient Care Team: ?Elizabeth Brine, FNP as PCP - General (General Practice) ? ?Hematological/Oncological History ?# Metastatic Adenocarcinoma of the Lung ?#Brain Metastasis ?1) 11/08/2019: establish care with Dr. Julien Mayer and his PA Elizabeth Mayer. Noted to have widely metastatic lung cancer with involvement of the brain, lymph nodes, and right lung.  ?2) 12/04/2019: palliative radiation to the right lung mass/cervical adenopathy ?3) 9/7/02021: started therapy with Carbo/Pem/Pem ?4) 9/29-10/09/2019: patient underwent SRS to the brain mets ?5) 03/22/2020: repeat CT C/A/P and neck scheduled. Transfer care to Dr. Lorenso Mayer.   ?6) 03/22/2020: CT C/A/P showed interval significant improvement in previously demonstrated extensive confluent lymphadenopathy in the superior mediastinum and right hilar regions. ?7) 08/15/2020: Holding chemotherapy due to poor Hgb and fatigue. Allowing patient a brief 'chemo holiday' to rebound appropriately from last cycle.  ?8) 08/29/2020: restarted chemotherapy, Cycle 12 Day 1 ?9) 09/19/2020: patient requested to HOLD Cycle 13 in setting of fatigue/weakness.  ?10) 11/29/2020: Cycle 14 Day 1 of chemotherapy, delayed start due to insurance issues.  ? ?Interval History:  ?Elizabeth Mayer 61 y.o. female with medical history significant for metastatic adenocarcinoma of the lung who presents for follow up. The patient's last visit was on 07/04/2021 when she received Cycle 24, Day 1 of pem/pem maintenance.  In the interim since her last visit she has had no other changed in her health.  ? ?On exam today Elizabeth Mayer reports she wants a break from chemotherapy.  She notes that she feels wiped out and fatigued and would like a break.  She is also been having some increased swelling in her lower extremities bilaterally and increasing pain in her feet.  She decreased her p.o. intake of fluid and unfortunately has had  a bump in creatinine when that occurred.  She notes that her appetite is good she is not having any other focal symptoms but just a general feeling of being unwell and wanting a break.  She notes that she is currently on antibiotics for her ingrown toenail which was recently worked on.  She denies any nausea, vomiting, or diarrhea.  She denies fevers, chills, night sweats, chest pain or cough. She has no other complaints.  A full 10 point ROS is listed below. ? ?MEDICAL HISTORY:  ?Past Medical History:  ?Diagnosis Date  ? Anemia   ? Angina 1982  ? related to stress  ? Asthma   ? in the past,  no current problems  ? Complication of anesthesia   ? states anesthesia made her hair fall out  ? Constipation   ? Dyspnea   ? on oxygen at home - 3L via [place]  ? Fatigue   ? GERD (gastroesophageal reflux disease)   ? patient denies this dx  ? Headache   ? Heart murmur 1970s  ? no problems currently  ? Hypertension   ? Ingrown toenail   ? Lung cancer ([place]) 10/2019  ? metastatic disease to the brain  ? On home oxygen therapy   ? 2L via  - 24 hours a day  ? Past heart attack 1980-1981  ? pt states she passed out and woke up in hospital- told she had heart attack, but then dr said he couldn't find anything wrong.  ? Pneumonia   ? x 1  ? ? ?SURGICAL HISTORY: ?Past Surgical History:  ?Procedure Laterality Date  ? [place] SURGERY  2000  ? Disc removed from neck   ?  ingrown toe nail surgery Bilateral   ? IR IMAGING GUIDED PORT INSERTION  02/16/2020  ? MULTIPLE TOOTH EXTRACTIONS    ? for braces  ? RADIOLOGY WITH ANESTHESIA N/A 12/05/2019  ? Procedure: MRI BRAIN WITH AND WITHOUT CONTRAST;  Surgeon: Radiologist, Medication, MD;  Location: [place];  Service: Radiology;  Laterality: N/A;  ? RADIOLOGY WITH ANESTHESIA N/A 01/02/2020  ? Procedure: MRI BRAIN WITH AND WITHOUT CONTRAST;  Surgeon: Radiologist, Medication, MD;  Location: [place];  Service: Radiology;  Laterality: N/A;  ? RADIOLOGY WITH ANESTHESIA N/A 02/20/2020  ? Procedure: MRI  WITH ANESTHESIA OF BRAIN WITH AND WITHOUT CONTRAST;  Surgeon: Radiologist, Medication, MD;  Location: [place];  Service: Radiology;  Laterality: N/A;  ? RADIOLOGY WITH ANESTHESIA N/A 06/11/2020  ? Procedure: MRI WITH ANESTHESIA OF BRAIN WITH AND WITHOUT CONTRAST;  Surgeon: Radiologist, Medication, MD;  Location: [place];  Service: Radiology;  Laterality: N/A;  ? RADIOLOGY WITH ANESTHESIA N/A 10/10/2020  ? Procedure: MRI WITH ANESTHESIA BRAIN WITH AND WITHOUT CONTRAST;  Surgeon: Radiologist, Medication, MD;  Location: [place];  Service: Radiology;  Laterality: N/A;  ? RADIOLOGY WITH ANESTHESIA N/A 01/30/2021  ? Procedure: MRI BRAIN WITH AND WITHOUT CONTRASTWITH ANESTHESIA;  Surgeon: Radiologist, Medication, MD;  Location: [place];  Service: Radiology;  Laterality: N/A;  ? RADIOLOGY WITH ANESTHESIA N/A 06/12/2021  ? Procedure: MRI WITH ANESTHESIA OF BRAIN WITH AND WITHOUT CONTRAST;  Surgeon: Radiologist, Medication, MD;  Location: [place];  Service: Radiology;  Laterality: N/A;  ? TONSILLECTOMY    ? ? ?SOCIAL HISTORY: ?Social History  ? ?Socioeconomic History  ? Marital status: Legally Separated  ?  Spouse name: Not on file  ? Number of children: 0  ? Years of education: Not on file  ? Highest education level: Not on file  ?Occupational History  ? Occupation: Secondary school teacher  ?Tobacco Use  ? Smoking status: Former  ?  Packs/day: 1.00  ?  Years: 20.00  ?  Pack years: 20.00  ?  Types: Cigarettes  ?  Start date: 69  ?  Quit date: 2001  ?  Years since quitting: 22.3  ? Smokeless tobacco: Never  ?Vaping Use  ? Vaping Use: Never used  ?Substance and Sexual Activity  ? Alcohol use: No  ? Drug use: No  ? Sexual activity: Yes  ?  Birth control/protection: None, Post-menopausal  ?Other Topics Concern  ? Not on file  ?Social History Narrative  ? Not on file  ? ?Social Determinants of Health  ? ?Financial Resource Strain: Not on file  ?Food Insecurity: Not on file  ?Transportation Needs: Not on file  ?Physical Activity: Not on file  ?Stress:  Not on file  ?Social Connections: Not on file  ?Intimate Partner Violence: Not on file  ? ? ?FAMILY HISTORY: ?Family History  ?Problem Relation Age of Onset  ? Arthritis Mother   ? Heart failure Father   ? ? ?ALLERGIES:  is allergic to pseudoephedrine, gabapentin, mobic [meloxicam], penicillins, and prednisone. ? ?MEDICATIONS:  ?Current Outpatient Medications  ?Medication Sig Dispense Refill  ? acetaminophen (TYLENOL) 500 MG tablet Take 1,000 mg by mouth 2 (two) times daily as needed (pain.).    ? albuterol (VENTOLIN HFA) 108 (90 Base) MCG/ACT inhaler Inhale 1-2 puffs into the lungs See admin instructions. Use 1 inhalation twice daily (scheduled) & may use every 6 hours as needed for wheezing/shortness of breath    ? amLODipine (NORVASC) 5 MG tablet Take 1 tablet (5 mg total) by mouth daily.  30 tablet 2  ? benzoyl peroxide (CERAVE ACNE FOAMING CREAM) 4 % external liquid Apply 1 application topically 2 (two) times daily. Face    ? bisacodyl (DULCOLAX) 5 MG EC tablet Take 5 mg by mouth daily as needed for moderate constipation.    ? Blood Pressure Monitoring (BLOOD PRESSURE MONITOR AUTOMAT) DEVI Check Blood pressure at home daily 1 each 0  ? Carboxymethylcellulose Sodium (REFRESH CELLUVISC OP) Place 2 drops into both eyes in the morning and at bedtime.    ? cetaphil (CETAPHIL) lotion Apply 1 application. topically as needed for dry skin.    ? clindamycin (CLEOCIN T) 1 % lotion Apply 1 application topically 2 (two) times daily.    ? cyclobenzaprine (FLEXERIL) 10 MG tablet Take 1 tablet (10 mg total) by mouth 3 (three) times daily as needed for muscle spasms. 30 tablet 0  ? diltiazem (CARDIZEM) 30 MG tablet Take 1 tablet (30 mg total) by mouth 4 (four) times daily. (Patient taking differently: Take 30 mg by mouth 4 (four) times daily as needed (afib episodes).) 120 tablet 1  ? docusate sodium (COLACE) 100 MG capsule Take 100 mg by mouth 2 (two) times daily as needed for moderate constipation.    ? doxycycline  (VIBRA-TABS) 100 MG tablet Take 1 tablet (100 mg total) by mouth 2 (two) times daily. 20 tablet 1  ? fexofenadine (ALLEGRA) 180 MG tablet Take 180 mg by mouth in the morning.    ? fluticasone (FLONASE) 50 MCG/ACT

## 2021-07-28 ENCOUNTER — Telehealth: Payer: Self-pay | Admitting: Hematology and Oncology

## 2021-07-28 NOTE — Telephone Encounter (Signed)
Scheduled per 4/21 los, pt has been called and confirmed  ?

## 2021-08-07 ENCOUNTER — Other Ambulatory Visit: Payer: Self-pay | Admitting: Radiation Therapy

## 2021-08-15 ENCOUNTER — Inpatient Hospital Stay: Admission: RE | Admit: 2021-08-15 | Payer: Self-pay | Source: Ambulatory Visit | Admitting: Radiation Oncology

## 2021-08-29 ENCOUNTER — Other Ambulatory Visit: Payer: Self-pay

## 2021-08-29 ENCOUNTER — Ambulatory Visit
Admission: RE | Admit: 2021-08-29 | Discharge: 2021-08-29 | Disposition: A | Discharge status: Home / Self Care | Payer: Medicaid Other | Source: Ambulatory Visit | Attending: Radiation Oncology | Admitting: Radiation Oncology

## 2021-08-29 VITALS — BP 144/79 | HR 90 | Temp 97.8°F | Resp 20 | Ht 65.0 in | Wt 149.2 lb

## 2021-08-29 DIAGNOSIS — Z923 Personal history of irradiation: Secondary | ICD-10-CM | POA: Insufficient documentation

## 2021-08-29 DIAGNOSIS — C7931 Secondary malignant neoplasm of brain: Secondary | ICD-10-CM | POA: Diagnosis not present

## 2021-08-29 DIAGNOSIS — Z79899 Other long term (current) drug therapy: Secondary | ICD-10-CM | POA: Insufficient documentation

## 2021-08-29 DIAGNOSIS — R609 Edema, unspecified: Secondary | ICD-10-CM | POA: Insufficient documentation

## 2021-08-29 DIAGNOSIS — C3491 Malignant neoplasm of unspecified part of right bronchus or lung: Secondary | ICD-10-CM | POA: Insufficient documentation

## 2021-08-29 NOTE — Progress Notes (Signed)
Radiation Oncology         [phone] ________________________________  Name: Elizabeth Mayer MRN: [id number]  Date: 08/29/2021  DOB: 29-Apr-1960  Follow-Up Visit Note  Outpatient  CC: Minette Brine, FNP  Minette Brine, FNP  Diagnosis and Prior Radiotherapy:    ICD-10-CM   1. Non-small cell lung cancer, right ([place])  C34.91     2. Metastasis to brain [facility])  C79.31        Radiation Treatment Dates: 02/11/2021 through 02/11/2021 Site Technique Total Dose (Gy) Dose per Fx (Gy) Completed Fx Beam Energies  Brain: Brain_SRS 3D 20/20 20 1/1 [id number]    CHIEF COMPLAINT: Here for follow-up and surveillance of brain cancer   Narrative:    She presents today for physical exam and completion of H+P forms to allow sedation during MRI of brain, scheduled next week.  Neurologically she is stable.  Baseline lightheadedness. Ankle edema is bothersome, has stockings for this.  ALLERGIES:  is allergic to pseudoephedrine, gabapentin, mobic [meloxicam], penicillins, and prednisone.  Meds: Current Outpatient Medications  Medication Sig Dispense Refill   acetaminophen (TYLENOL) 500 MG tablet Take 1,000 mg by mouth 2 (two) times daily as needed (pain.).     albuterol (VENTOLIN HFA) 108 (90 Base) MCG/ACT inhaler Inhale 1-2 puffs into the lungs See admin instructions. Use 1 inhalation twice daily (scheduled) & may use every 6 hours as needed for wheezing/shortness of breath     amLODipine (NORVASC) 5 MG tablet Take 1 tablet (5 mg total) by mouth daily. 30 tablet 2   benzoyl peroxide (CERAVE ACNE FOAMING CREAM) 4 % external liquid Apply 1 application topically 2 (two) times daily. Face     bisacodyl (DULCOLAX) 5 MG EC tablet Take 5 mg by mouth daily as needed for moderate constipation.     Blood Pressure Monitoring (BLOOD PRESSURE MONITOR AUTOMAT) DEVI Check Blood pressure at home daily 1 each 0   Carboxymethylcellulose Sodium (REFRESH CELLUVISC OP) Place 2 drops into both eyes in the morning and at  bedtime.     cetaphil (CETAPHIL) lotion Apply 1 application. topically as needed for dry skin.     clindamycin (CLEOCIN T) 1 % lotion Apply 1 application topically 2 (two) times daily.     cyclobenzaprine (FLEXERIL) 10 MG tablet Take 1 tablet (10 mg total) by mouth 3 (three) times daily as needed for muscle spasms. 30 tablet 0   diltiazem (CARDIZEM) 30 MG tablet Take 1 tablet (30 mg total) by mouth 4 (four) times daily. (Patient taking differently: Take 30 mg by mouth 4 (four) times daily as needed (afib episodes).) 120 tablet 1   docusate sodium (COLACE) 100 MG capsule Take 100 mg by mouth 2 (two) times daily as needed for moderate constipation.     doxycycline (VIBRA-TABS) 100 MG tablet Take 1 tablet (100 mg total) by mouth 2 (two) times daily. 20 tablet 1   fexofenadine (ALLEGRA) 180 MG tablet Take 180 mg by mouth in the morning.     fluticasone (FLONASE) 50 MCG/ACT nasal spray Place 1 spray into both nostrils daily.     folic acid (FOLVITE) 1 MG tablet Take 1 tablet (1 mg total) by mouth daily. 90 tablet 1   guaiFENesin (MUCINEX) 600 MG 12 hr tablet Take 600 mg by mouth 2 (two) times daily.      HYDROcodone-acetaminophen (NORCO/VICODIN) 5-325 MG tablet Take 1 tablet by mouth every 6 (six) hours as needed for moderate pain. 30 tablet 0   lidocaine (XYLOCAINE) 2 % solution  Use as directed 15 mLs in the mouth or throat as needed for mouth pain. Swallow 30 min prior to meals and at bedtime for throat/esophagus pain 250 mL 1   lidocaine-prilocaine (EMLA) cream Apply 1 application topically as needed. (Patient taking differently: Apply 1 application. topically as needed (port access). Every three weeks) 30 g 2   Multiple Vitamin (MULTIVITAMIN WITH MINERALS) TABS tablet Take 2 tablets by mouth daily. Alive/ gummies     ondansetron (ZOFRAN) 8 MG tablet Take 1 tablet (8 mg total) by mouth every 8 (eight) hours as needed for nausea or vomiting. 20 tablet 0   OXYGEN Inhale 3 L/min into the lungs See admin  instructions. At bedtime & as needed during the day     Pembrolizumab (KEYTRUDA IV) Inject into the vein. Every 3 weeks     PROAIR HFA 108 (90 Base) MCG/ACT inhaler Inhale 1-2 puffs into the lungs every 6 (six) hours as needed for wheezing or shortness of breath. 18 g 3   Soap & Cleansers (MEDERMA AG FACIAL TONER) LIQD Apply 1 application topically 2 (two) times daily. Advance dry skin     SYMBICORT 80-4.5 MCG/ACT inhaler INHALE 2 PUFFS BY MOUTH IN THE MORNING AND AT BEDTIME 11 g 5   triamcinolone ointment (KENALOG) 0.1 % Apply 1 application topically in the morning and at bedtime.     No current facility-administered medications for this encounter.    Physical Findings: The patient is in no acute distress. Patient is alert and oriented.  height is 5\' 5"  (1.651 m) and weight is 149 lb 3.2 oz (67.7 kg). Her temperature is 97.8 F (36.6 C). Her blood pressure is 144/79 (abnormal) and her pulse is 90. Her respiration is 20 and oxygen saturation is 100%. .    General: Alert and oriented, in no acute distress HEENT: Head is normocephalic. Extraocular movements are intact. Oropharynx is clear.  Breathing oxygen through nasal cannula Musculoskeletal: symmetric strength and muscle tone throughout.  Ambulatory. Heart RRR Chest CTAB Abd: NT/ND Neurologic: Cranial nerves II through XII are grossly intact. No obvious focalities. Speech is fluent. Coordination is intact. Psychiatric: Judgment and insight are intact. Affect is appropriate. Ext: ankle edema b/l. No calf tenderness to palpation.   Lab Findings: Lab Results  Component Value Date   WBC 4.3 07/25/2021   HGB 9.5 (L) 07/25/2021   HCT 28.5 (L) 07/25/2021   MCV 96.3 07/25/2021   PLT 173 07/25/2021    Radiographic Findings: No results found.  Impression/Plan:   She is doing well overall and will undergo a MRI of her brain in early July. I filled out forms for sedation during scans. She has followup with Dr. Mickeal Skinner and Lorenso Courier and I will  see her prior to her next MRI - timing of this study will be at Dr. Renda Rolls discretion.   On date of service, in total, I spent 20 minutes on this encounter. Patient was seen in person.  _____________________________________   Eppie Gibson, MD

## 2021-09-05 ENCOUNTER — Inpatient Hospital Stay: Payer: Medicaid Other

## 2021-09-05 ENCOUNTER — Inpatient Hospital Stay (HOSPITAL_BASED_OUTPATIENT_CLINIC_OR_DEPARTMENT_OTHER): Payer: Medicaid Other | Admitting: Hematology and Oncology

## 2021-09-05 ENCOUNTER — Other Ambulatory Visit: Payer: Self-pay

## 2021-09-05 ENCOUNTER — Inpatient Hospital Stay: Payer: Medicaid Other | Attending: Internal Medicine

## 2021-09-05 ENCOUNTER — Other Ambulatory Visit: Payer: Self-pay | Admitting: Hematology and Oncology

## 2021-09-05 VITALS — BP 154/80 | HR 81 | Temp 98.2°F | Resp 17 | Wt 148.0 lb

## 2021-09-05 DIAGNOSIS — R600 Localized edema: Secondary | ICD-10-CM | POA: Insufficient documentation

## 2021-09-05 DIAGNOSIS — R5383 Other fatigue: Secondary | ICD-10-CM | POA: Insufficient documentation

## 2021-09-05 DIAGNOSIS — Z95828 Presence of other vascular implants and grafts: Secondary | ICD-10-CM

## 2021-09-05 DIAGNOSIS — Z5112 Encounter for antineoplastic immunotherapy: Secondary | ICD-10-CM | POA: Diagnosis present

## 2021-09-05 DIAGNOSIS — C3491 Malignant neoplasm of unspecified part of right bronchus or lung: Secondary | ICD-10-CM | POA: Diagnosis present

## 2021-09-05 DIAGNOSIS — Z79899 Other long term (current) drug therapy: Secondary | ICD-10-CM | POA: Diagnosis not present

## 2021-09-05 DIAGNOSIS — M546 Pain in thoracic spine: Secondary | ICD-10-CM | POA: Insufficient documentation

## 2021-09-05 DIAGNOSIS — I10 Essential (primary) hypertension: Secondary | ICD-10-CM | POA: Insufficient documentation

## 2021-09-05 DIAGNOSIS — R42 Dizziness and giddiness: Secondary | ICD-10-CM | POA: Diagnosis not present

## 2021-09-05 DIAGNOSIS — Z87891 Personal history of nicotine dependence: Secondary | ICD-10-CM | POA: Insufficient documentation

## 2021-09-05 DIAGNOSIS — C779 Secondary and unspecified malignant neoplasm of lymph node, unspecified: Secondary | ICD-10-CM | POA: Insufficient documentation

## 2021-09-05 DIAGNOSIS — D6481 Anemia due to antineoplastic chemotherapy: Secondary | ICD-10-CM | POA: Diagnosis not present

## 2021-09-05 DIAGNOSIS — C7931 Secondary malignant neoplasm of brain: Secondary | ICD-10-CM | POA: Insufficient documentation

## 2021-09-05 LAB — CBC WITH DIFFERENTIAL (CANCER CENTER ONLY)
Abs Immature Granulocytes: 0.01 10*3/uL (ref 0.00–0.07)
Basophils Absolute: 0 10*3/uL (ref 0.0–0.1)
Basophils Relative: 1 %
Eosinophils Absolute: 0.2 10*3/uL (ref 0.0–0.5)
Eosinophils Relative: 6 %
HCT: 31.5 % — ABNORMAL LOW (ref 36.0–46.0)
Hemoglobin: 10.3 g/dL — ABNORMAL LOW (ref 12.0–15.0)
Immature Granulocytes: 0 %
Lymphocytes Relative: 27 %
Lymphs Abs: 1.1 10*3/uL (ref 0.7–4.0)
MCH: 30.7 pg (ref 26.0–34.0)
MCHC: 32.7 g/dL (ref 30.0–36.0)
MCV: 94 fL (ref 80.0–100.0)
Monocytes Absolute: 0.3 10*3/uL (ref 0.1–1.0)
Monocytes Relative: 8 %
Neutro Abs: 2.3 10*3/uL (ref 1.7–7.7)
Neutrophils Relative %: 58 %
Platelet Count: 136 10*3/uL — ABNORMAL LOW (ref 150–400)
RBC: 3.35 MIL/uL — ABNORMAL LOW (ref 3.87–5.11)
RDW: 13 % (ref 11.5–15.5)
WBC Count: 4 10*3/uL (ref 4.0–10.5)
nRBC: 0 % (ref 0.0–0.2)

## 2021-09-05 LAB — TSH: TSH: 1.259 u[IU]/mL (ref 0.350–4.500)

## 2021-09-05 LAB — CMP (CANCER CENTER ONLY)
ALT: 11 U/L (ref 0–44)
AST: 20 U/L (ref 15–41)
Albumin: 3.5 g/dL (ref 3.5–5.0)
Alkaline Phosphatase: 44 U/L (ref 38–126)
Anion gap: 6 (ref 5–15)
BUN: 13 mg/dL (ref 8–23)
CO2: 30 mmol/L (ref 22–32)
Calcium: 9.7 mg/dL (ref 8.9–10.3)
Chloride: 107 mmol/L (ref 98–111)
Creatinine: 1.22 mg/dL — ABNORMAL HIGH (ref 0.44–1.00)
GFR, Estimated: 50 mL/min — ABNORMAL LOW (ref >60)
Glucose, Bld: 82 mg/dL (ref 70–99)
Potassium: 3.4 mmol/L — ABNORMAL LOW (ref 3.5–5.1)
Sodium: 143 mmol/L (ref 135–145)
Total Bilirubin: 0.5 mg/dL (ref 0.3–1.2)
Total Protein: 6.9 g/dL (ref 6.5–8.1)

## 2021-09-05 MED ORDER — SODIUM CHLORIDE 0.9% FLUSH
10.0000 mL | Freq: Once | INTRAVENOUS | Status: AC
  Administered 2021-09-05: 10 mL

## 2021-09-05 MED ORDER — HEPARIN SOD (PORK) LOCK FLUSH 100 UNIT/ML IV SOLN
500.0000 [IU] | Freq: Once | INTRAVENOUS | Status: AC
  Administered 2021-09-05: 500 [IU] via INTRAVENOUS

## 2021-09-05 MED ORDER — SODIUM CHLORIDE 0.9% FLUSH
10.0000 mL | Freq: Once | INTRAVENOUS | Status: AC
  Administered 2021-09-05: 10 mL via INTRAVENOUS

## 2021-09-05 NOTE — Progress Notes (Signed)
[place] [phone]   [phone]  PROGRESS NOTE  Patient Care Team: Minette Brine, FNP as PCP - General (General Practice)  Hematological/Oncological History # Metastatic Adenocarcinoma of the Lung #Brain Metastasis 1) 11/08/2019: establish care with Dr. Julien Nordmann and his PA Cassie Heilingoetter. Noted to have widely metastatic lung cancer with involvement of the brain, lymph nodes, and right lung.  2) 12/04/2019: palliative radiation to the right lung mass/cervical adenopathy 3) 9/7/02021: started therapy with Carbo/Pem/Pem 4) 9/29-10/09/2019: patient underwent SRS to the brain mets 5) 03/22/2020: repeat CT C/A/P and neck scheduled. Transfer care to Dr. Lorenso Courier.   6) 03/22/2020: CT C/A/P showed interval significant improvement in previously demonstrated extensive confluent lymphadenopathy in the superior mediastinum and right hilar regions. 7) 08/15/2020: Holding chemotherapy due to poor Hgb and fatigue. Allowing patient a brief 'chemo holiday' to rebound appropriately from last cycle.  8) 08/29/2020: restarted chemotherapy, Cycle 12 Day 1 9) 09/19/2020: patient requested to HOLD Cycle 13 in setting of fatigue/weakness.  10) 11/29/2020: Cycle 14 Day 1 of chemotherapy, delayed start due to insurance issues.   Interval History:  Elizabeth Mayer 61 y.o. female with medical history significant for metastatic adenocarcinoma of the lung who presents for follow up. The patient's last visit was on 07/25/2021.  In the interim since her last visit she has been on a chemotherapy holiday.  On exam today Elizabeth Mayer reports she is not feeling as well as she would have hoped after a chemotherapy holiday.  She reports that the swelling is still present but it is somewhat improved.  She notes that when she sits down to list her leg she sometimes feels discomfort in her abdomen.  She has not been wearing compression socks due to the issues she had with her toenail.  She reports  that the swelling is up to approximately the shins and is about a +1 pitting edema.  She continues to have some occasional issues with headache and dizziness.  She notes that she would like to continue her chemotherapy holiday at this time.  She denies any nausea, vomiting, or diarrhea.  She denies fevers, chills, night sweats, chest pain or cough. She has no other complaints.  A full 10 point ROS is listed below.  MEDICAL HISTORY:  Past Medical History:  Diagnosis Date   Anemia    Angina 1982   related to stress   Asthma    in the past,  no current problems   Complication of anesthesia    states anesthesia made her hair fall out   Constipation    Dyspnea    on oxygen at home - 3L via [place]   Fatigue    GERD (gastroesophageal reflux disease)    patient denies this dx   Headache    Heart murmur 1970s   no problems currently   Hypertension    Ingrown toenail    Lung cancer ([place]) 10/2019   metastatic disease to the brain   On home oxygen therapy    2L via [place] - 24 hours a day   Past heart attack 1980-1981   pt states she passed out and woke up in hospital- told she had heart attack, but then dr said he couldn't find anything wrong.   Pneumonia    x 1    SURGICAL HISTORY: Past Surgical History:  Procedure Laterality Date   CERVICAL DISC SURGERY  2000   Disc removed from neck    ingrown toe nail surgery Bilateral  IR IMAGING GUIDED PORT INSERTION  02/16/2020   MULTIPLE TOOTH EXTRACTIONS     for braces   RADIOLOGY WITH ANESTHESIA N/A 12/05/2019   Procedure: MRI BRAIN WITH AND WITHOUT CONTRAST;  Surgeon: Radiologist, Medication, MD;  Location: [place];  Service: Radiology;  Laterality: N/A;   RADIOLOGY WITH ANESTHESIA N/A 01/02/2020   Procedure: MRI BRAIN WITH AND WITHOUT CONTRAST;  Surgeon: Radiologist, Medication, MD;  Location: [place];  Service: Radiology;  Laterality: N/A;   RADIOLOGY WITH ANESTHESIA N/A 02/20/2020   Procedure: MRI WITH ANESTHESIA OF BRAIN WITH AND WITHOUT  CONTRAST;  Surgeon: Radiologist, Medication, MD;  Location: [place];  Service: Radiology;  Laterality: N/A;   RADIOLOGY WITH ANESTHESIA N/A 06/11/2020   Procedure: MRI WITH ANESTHESIA OF BRAIN WITH AND WITHOUT CONTRAST;  Surgeon: Radiologist, Medication, MD;  Location: [place];  Service: Radiology;  Laterality: N/A;   RADIOLOGY WITH ANESTHESIA N/A 10/10/2020   Procedure: MRI WITH ANESTHESIA BRAIN WITH AND WITHOUT CONTRAST;  Surgeon: Radiologist, Medication, MD;  Location: [place];  Service: Radiology;  Laterality: N/A;   RADIOLOGY WITH ANESTHESIA N/A 01/30/2021   Procedure: MRI BRAIN WITH AND WITHOUT CONTRASTWITH ANESTHESIA;  Surgeon: Radiologist, Medication, MD;  Location: [place];  Service: Radiology;  Laterality: N/A;   RADIOLOGY WITH ANESTHESIA N/A 06/12/2021   Procedure: MRI WITH ANESTHESIA OF BRAIN WITH AND WITHOUT CONTRAST;  Surgeon: Radiologist, Medication, MD;  Location: [place];  Service: Radiology;  Laterality: N/A;   TONSILLECTOMY      SOCIAL HISTORY: Social History   Socioeconomic History   Marital status: Legally Separated    Spouse name: Not on file   Number of children: 0   Years of education: Not on file   Highest education level: Not on file  Occupational History   Occupation: Secondary school teacher  Tobacco Use   Smoking status: Former    Packs/day: 1.00    Years: 20.00    Pack years: 20.00    Types: Cigarettes    Start date: 34    Quit date: 2001    Years since quitting: 22.4   Smokeless tobacco: Never  Vaping Use   Vaping Use: Never used  Substance and Sexual Activity   Alcohol use: No   Drug use: No   Sexual activity: Yes    Birth control/protection: None, Post-menopausal  Other Topics Concern   Not on file  Social History Narrative   Not on file   Social Determinants of Health   Financial Resource Strain: Not on file  Food Insecurity: Not on file  Transportation Needs: Not on file  Physical Activity: Not on file  Stress: Not on file  Social Connections: Not on  file  Intimate Partner Violence: Not on file    FAMILY HISTORY: Family History  Problem Relation Age of Onset   Arthritis Mother    Heart failure Father     ALLERGIES:  is allergic to pseudoephedrine, gabapentin, mobic [meloxicam], penicillins, and prednisone.  MEDICATIONS:  Current Outpatient Medications  Medication Sig Dispense Refill   acetaminophen (TYLENOL) 500 MG tablet Take 1,000 mg by mouth 2 (two) times daily as needed (pain.).     albuterol (VENTOLIN HFA) 108 (90 Base) MCG/ACT inhaler Inhale 1-2 puffs into the lungs See admin instructions. Use 1 inhalation twice daily (scheduled) & may use every 6 hours as needed for wheezing/shortness of breath     amLODipine (NORVASC) 5 MG tablet Take 1 tablet (5 mg total) by mouth daily. 30 tablet 2   benzoyl peroxide (CERAVE  ACNE FOAMING CREAM) 4 % external liquid Apply 1 application topically 2 (two) times daily. Face     bisacodyl (DULCOLAX) 5 MG EC tablet Take 5 mg by mouth daily as needed for moderate constipation.     Blood Pressure Monitoring (BLOOD PRESSURE MONITOR AUTOMAT) DEVI Check Blood pressure at home daily 1 each 0   Carboxymethylcellulose Sodium (REFRESH CELLUVISC OP) Place 2 drops into both eyes in the morning and at bedtime.     cetaphil (CETAPHIL) lotion Apply 1 application. topically as needed for dry skin.     clindamycin (CLEOCIN T) 1 % lotion Apply 1 application topically 2 (two) times daily.     cyclobenzaprine (FLEXERIL) 10 MG tablet Take 1 tablet (10 mg total) by mouth 3 (three) times daily as needed for muscle spasms. 30 tablet 0   diltiazem (CARDIZEM) 30 MG tablet Take 1 tablet (30 mg total) by mouth 4 (four) times daily. (Patient taking differently: Take 30 mg by mouth 4 (four) times daily as needed (afib episodes).) 120 tablet 1   docusate sodium (COLACE) 100 MG capsule Take 100 mg by mouth 2 (two) times daily as needed for moderate constipation.     doxycycline (VIBRA-TABS) 100 MG tablet Take 1 tablet (100 mg  total) by mouth 2 (two) times daily. 20 tablet 1   fexofenadine (ALLEGRA) 180 MG tablet Take 180 mg by mouth in the morning.     fluticasone (FLONASE) 50 MCG/ACT nasal spray Place 1 spray into both nostrils daily.     folic acid (FOLVITE) 1 MG tablet Take 1 tablet (1 mg total) by mouth daily. 90 tablet 1   guaiFENesin (MUCINEX) 600 MG 12 hr tablet Take 600 mg by mouth 2 (two) times daily.      HYDROcodone-acetaminophen (NORCO/VICODIN) 5-325 MG tablet Take 1 tablet by mouth every 6 (six) hours as needed for moderate pain. 30 tablet 0   lidocaine (XYLOCAINE) 2 % solution Use as directed 15 mLs in the mouth or throat as needed for mouth pain. Swallow 30 min prior to meals and at bedtime for throat/esophagus pain 250 mL 1   lidocaine-prilocaine (EMLA) cream Apply 1 application topically as needed. (Patient taking differently: Apply 1 application. topically as needed (port access). Every three weeks) 30 g 2   Multiple Vitamin (MULTIVITAMIN WITH MINERALS) TABS tablet Take 2 tablets by mouth daily. Alive/ gummies     ondansetron (ZOFRAN) 8 MG tablet Take 1 tablet (8 mg total) by mouth every 8 (eight) hours as needed for nausea or vomiting. 20 tablet 0   OXYGEN Inhale 3 L/min into the lungs See admin instructions. At bedtime & as needed during the day     Pembrolizumab (KEYTRUDA IV) Inject into the vein. Every 3 weeks     PROAIR HFA 108 (90 Base) MCG/ACT inhaler Inhale 1-2 puffs into the lungs every 6 (six) hours as needed for wheezing or shortness of breath. 18 g 3   Soap & Cleansers (MEDERMA AG FACIAL TONER) LIQD Apply 1 application topically 2 (two) times daily. Advance dry skin     SYMBICORT 80-4.5 MCG/ACT inhaler INHALE 2 PUFFS BY MOUTH IN THE MORNING AND AT BEDTIME 11 g 5   triamcinolone ointment (KENALOG) 0.1 % Apply 1 application topically in the morning and at bedtime.     No current facility-administered medications for this visit.    REVIEW OF SYSTEMS:   Constitutional: ( - ) fevers, ( - )   chills , ( - ) night sweats Eyes: ( - )  blurriness of vision, ( - ) double vision, ( - ) watery eyes Ears, nose, mouth, throat, and face: ( - ) mucositis, ( - ) sore throat Respiratory: ( - ) cough, ( - ) dyspnea, ( - ) wheezes Cardiovascular: ( - ) palpitation, ( - ) chest discomfort, ( +) lower extremity swelling Gastrointestinal:  ( - ) nausea, ( - ) heartburn, ( - ) change in bowel habits Skin: ( - ) abnormal skin rashes Lymphatics: ( - ) new lymphadenopathy, ( - ) easy bruising Neurological: ( - ) numbness, ( - ) tingling, ( - ) new weaknesses Behavioral/Psych: ( - ) mood change, ( - ) new changes  All other systems were reviewed with the patient and are negative.  PHYSICAL EXAMINATION: ECOG PERFORMANCE STATUS: 2 - Symptomatic, <50% confined to bed  Vitals:   09/05/21 1132  BP: (!) 154/80  Pulse: 81  Resp: 17  Temp: 98.2 F (36.8 C)  SpO2: 96%     Filed Weights   09/05/21 1132  Weight: 148 lb (67.1 kg)     GENERAL: well appearing middle aged [place] American female. alert, no distress and comfortable SKIN: skin color, texture, turgor are normal, no rashes or significant lesions EYES: conjunctiva are pink and non-injected, sclera clear LUNGS: clear to auscultation and percussion with normal breathing effort HEART: regular rate & rhythm and no murmurs and +1 pitting bilateral lower extremity edema Musculoskeletal: no cyanosis of digits and no clubbing.  PSYCH: alert & oriented x 3, fluent speech NEURO: no focal motor/sensory deficits   LABORATORY DATA:  I have reviewed the data as listed    Latest Ref Rng & Units 09/05/2021   10:57 AM 07/25/2021   11:19 AM 07/04/2021    9:46 AM  CBC  WBC 4.0 - 10.5 K/uL 4.0   4.3   3.8    Hemoglobin 12.0 - 15.0 g/dL 10.3   9.5   9.7    Hematocrit 36.0 - 46.0 % 31.5   28.5   29.5    Platelets 150 - 400 K/uL 136   173   183         Latest Ref Rng & Units 09/05/2021   10:57 AM 07/25/2021   11:19 AM 07/04/2021    9:46 AM  CMP   Glucose 70 - 99 mg/dL 82   83   131    BUN 8 - 23 mg/dL 13   21   13     Creatinine 0.44 - 1.00 mg/dL 1.22   1.41   1.15    Sodium 135 - 145 mmol/L 143   143   143    Potassium 3.5 - 5.1 mmol/L 3.4   3.4   3.4    Chloride 98 - 111 mmol/L 107   105   107    CO2 22 - 32 mmol/L 30   30   30     Calcium 8.9 - 10.3 mg/dL 9.7   9.0   9.2    Total Protein 6.5 - 8.1 g/dL 6.9   6.7   6.9    Total Bilirubin 0.3 - 1.2 mg/dL 0.5   0.4   0.3    Alkaline Phos 38 - 126 U/L 44   47   39    AST 15 - 41 U/L 20   22   21     ALT 0 - 44 U/L 11   12   10       RADIOGRAPHIC STUDIES: No results found.  ASSESSMENT & PLAN Elizabeth Mayer 61 y.o. female with medical history significant for metastatic adenocarcinoma of the lung who presents for follow up.    After review the labs, the records, discussion with the patient the findings are most consistent with metastatic adenocarcinoma the lung without any targetable mutations.  The patient has been started on carboplatin pemetrexed pembrolizumab every 3 weeks with Dr. Julien Nordmann, however the patient has expressed a desire to transition to another provider.  As such we were happy to take up her care here.  She already discontinued the carboplatin and is on maintenance pemetrexed/pembrolizumab therapy. We assumed her care with Cycle 6 on 04/04/2020.  # Metastatic Adenocarcinoma of the Lung #Brain Metastasis  --no targetable mutations noted on prior workup. --continue to follow with Dr. Laqueta Due Onc for metastatic spread to the brain.  --monitor CMP, CBC, and TSH with each treatment --can consider docetaxel/ramucirumab at time of progression.  Plan: --CT scan shows stable disease in April 2023. Next scan in July 2023.  --MRI brain from 06/12/2021 shows stable disease. Next due June 2023.  --today was to be Cycle 25 Day 1 of maintenance Pem/Pem, however patient has requested to continue her chemo holiday  --Labs today were reviewed and stable from prior.  --RTC in 3  weeks for Cycle 25 of chemotherapy  #Chemotherapy Induced Anemia --Hgb 10.3, MCV 94.0 today, stable from prior --continue to monitor   # Fatigue: --Uncertain etiology but likely chemotherapy/immunotherapy side effects  --Monitor closely  # Mid back pain: --no findings of metastasis to spine or structural problems with back on last imaging.    #hypokalemia, resolved: --potassium level is 3.4 --continue to monitor  #Bilateral ankle edema: --gave prescription for compression stockings --+1 pitting edema of LE bilaterally seen on physical exam  #Memory changes/vision changes/dizziness --Memory loss present for the last several weeks, mainly with word recognition and difficulty remembering daily tasks.  --Floaters in lower visual field of left eye, occasional.  --Dizziness is more frequent but shorter in duration.  --Most recent MRI brain from 06/12/2021 was stable. Patient is scheduled for a follow up with Dr. Mickeal Skinner   No orders of the defined types were placed in this encounter.  All questions were answered. The patient knows to call the clinic with any problems, questions or concerns.  I have spent a total of 30 minutes minutes of face-to-face and non-face-to-face time, preparing to see the patient, obtaining and/or reviewing separately obtained history, performing a medically appropriate examination, counseling and educating the patient, ordering tests, documenting clinical information in the electronic health record and care coordination.   Ledell Peoples, MD Department of Hematology/Oncology [place] [facility] Phone: [phone] Pager: [phone] Email: [email or web address]  09/05/2021 2:29 PM

## 2021-09-09 ENCOUNTER — Other Ambulatory Visit: Payer: Self-pay

## 2021-09-09 ENCOUNTER — Encounter (HOSPITAL_COMMUNITY): Payer: Self-pay | Admitting: *Deleted

## 2021-09-09 NOTE — Progress Notes (Signed)
Spoke with pt for pre-op call. Pt has been here several times for same procedure. She has a hx of A-fib, only takes Diltiazem if needed. Last seen in the A-fib clinic on 07/17/21. Pt states she is not diabetic.   Shower instructions given to pt.

## 2021-09-11 ENCOUNTER — Encounter (HOSPITAL_COMMUNITY): Payer: Self-pay

## 2021-09-11 ENCOUNTER — Ambulatory Visit (HOSPITAL_COMMUNITY)
Admission: AD | Admit: 2021-09-11 | Discharge: 2021-09-11 | Disposition: A | Discharge status: Home / Self Care | Payer: Medicaid Other | Source: Ambulatory Visit | Attending: Internal Medicine | Admitting: Internal Medicine

## 2021-09-11 ENCOUNTER — Encounter (HOSPITAL_COMMUNITY): Admission: AD | Disposition: A | Discharge status: Home / Self Care | Payer: Self-pay | Source: Ambulatory Visit

## 2021-09-11 ENCOUNTER — Ambulatory Visit (HOSPITAL_BASED_OUTPATIENT_CLINIC_OR_DEPARTMENT_OTHER): Payer: Medicaid Other | Admitting: Certified Registered Nurse Anesthetist

## 2021-09-11 ENCOUNTER — Ambulatory Visit (HOSPITAL_COMMUNITY): Payer: Medicaid Other | Admitting: Certified Registered Nurse Anesthetist

## 2021-09-11 ENCOUNTER — Ambulatory Visit (HOSPITAL_COMMUNITY)
Admission: RE | Admit: 2021-09-11 | Discharge: 2021-09-11 | Disposition: A | Discharge status: Home / Self Care | Payer: Medicaid Other | Source: Ambulatory Visit | Attending: Internal Medicine | Admitting: Internal Medicine

## 2021-09-11 ENCOUNTER — Other Ambulatory Visit: Payer: Self-pay

## 2021-09-11 DIAGNOSIS — Z87891 Personal history of nicotine dependence: Secondary | ICD-10-CM | POA: Diagnosis not present

## 2021-09-11 DIAGNOSIS — C3491 Malignant neoplasm of unspecified part of right bronchus or lung: Secondary | ICD-10-CM | POA: Insufficient documentation

## 2021-09-11 DIAGNOSIS — M1990 Unspecified osteoarthritis, unspecified site: Secondary | ICD-10-CM

## 2021-09-11 DIAGNOSIS — C7931 Secondary malignant neoplasm of brain: Secondary | ICD-10-CM

## 2021-09-11 DIAGNOSIS — D649 Anemia, unspecified: Secondary | ICD-10-CM | POA: Diagnosis not present

## 2021-09-11 DIAGNOSIS — J449 Chronic obstructive pulmonary disease, unspecified: Secondary | ICD-10-CM | POA: Diagnosis not present

## 2021-09-11 DIAGNOSIS — Z9981 Dependence on supplemental oxygen: Secondary | ICD-10-CM | POA: Insufficient documentation

## 2021-09-11 DIAGNOSIS — I10 Essential (primary) hypertension: Secondary | ICD-10-CM | POA: Diagnosis not present

## 2021-09-11 DIAGNOSIS — K219 Gastro-esophageal reflux disease without esophagitis: Secondary | ICD-10-CM | POA: Insufficient documentation

## 2021-09-11 DIAGNOSIS — N289 Disorder of kidney and ureter, unspecified: Secondary | ICD-10-CM

## 2021-09-11 DIAGNOSIS — I209 Angina pectoris, unspecified: Secondary | ICD-10-CM | POA: Diagnosis not present

## 2021-09-11 DIAGNOSIS — R519 Headache, unspecified: Secondary | ICD-10-CM | POA: Insufficient documentation

## 2021-09-11 DIAGNOSIS — R0602 Shortness of breath: Secondary | ICD-10-CM | POA: Diagnosis not present

## 2021-09-11 HISTORY — PX: RADIOLOGY WITH ANESTHESIA: SHX6223

## 2021-09-11 HISTORY — DX: Unspecified atrial fibrillation: I48.91

## 2021-09-11 SURGERY — MRI WITH ANESTHESIA
Anesthesia: General

## 2021-09-11 MED ORDER — LIDOCAINE 2% (20 MG/ML) 5 ML SYRINGE
INTRAMUSCULAR | Status: DC | PRN
  Administered 2021-09-11: 60 mg via INTRAVENOUS

## 2021-09-11 MED ORDER — SODIUM CHLORIDE 0.9 % IV SOLN: INTRAVENOUS | Status: DC

## 2021-09-11 MED ORDER — ONDANSETRON HCL 4 MG/2ML IJ SOLN
INTRAMUSCULAR | Status: DC | PRN
  Administered 2021-09-11: 4 mg via INTRAVENOUS

## 2021-09-11 MED ORDER — PHENYLEPHRINE HCL-NACL 20-0.9 MG/250ML-% IV SOLN
INTRAVENOUS | Status: DC | PRN
  Administered 2021-09-11: 25 ug/min via INTRAVENOUS

## 2021-09-11 MED ORDER — GADOBUTROL 1 MMOL/ML IV SOLN
6.0000 mL | Freq: Once | INTRAVENOUS | Status: AC | PRN
  Administered 2021-09-11: 6 mL via INTRAVENOUS

## 2021-09-11 MED ORDER — LACTATED RINGERS IV SOLN: INTRAVENOUS | Status: DC

## 2021-09-11 MED ORDER — DEXAMETHASONE SODIUM PHOSPHATE 10 MG/ML IJ SOLN
INTRAMUSCULAR | Status: DC | PRN
  Administered 2021-09-11: 4 mg via INTRAVENOUS

## 2021-09-11 MED ORDER — ORAL CARE MOUTH RINSE
15.0000 mL | Freq: Once | OROMUCOSAL | Status: AC
Start: 2021-09-11 — End: 2021-09-11

## 2021-09-11 MED ORDER — FENTANYL CITRATE (PF) 250 MCG/5ML IJ SOLN
INTRAMUSCULAR | Status: DC | PRN
  Administered 2021-09-11: 50 ug via INTRAVENOUS

## 2021-09-11 MED ORDER — CHLORHEXIDINE GLUCONATE 0.12 % MT SOLN
15.0000 mL | Freq: Once | OROMUCOSAL | Status: AC
Start: 2021-09-11 — End: 2021-09-11
  Administered 2021-09-11: 15 mL via OROMUCOSAL
  Filled 2021-09-11: qty 15

## 2021-09-11 MED ORDER — PROPOFOL 10 MG/ML IV BOLUS
INTRAVENOUS | Status: DC | PRN
  Administered 2021-09-11: 50 mg via INTRAVENOUS
  Administered 2021-09-11: 100 mg via INTRAVENOUS

## 2021-09-11 NOTE — Anesthesia Procedure Notes (Signed)
Procedure Name: LMA Insertion Date/Time: 09/11/2021 10:07 AM  Performed by: Bryson Corona, CRNAPre-anesthesia Checklist: Patient identified, Emergency Drugs available, Suction available and Patient being monitored Patient Re-evaluated:Patient Re-evaluated prior to induction Oxygen Delivery Method: Circle System Utilized Preoxygenation: Pre-oxygenation with 100% oxygen Induction Type: IV induction LMA: LMA with gastric port inserted LMA Size: 4.0 Number of attempts: 1 Placement Confirmation: positive ETCO2 Tube secured with: Tape Dental Injury: Teeth and Oropharynx as per pre-operative assessment

## 2021-09-11 NOTE — Anesthesia Preprocedure Evaluation (Signed)
Anesthesia Evaluation  Patient identified by MRN, date of birth, ID band Patient awake    Reviewed: Allergy & Precautions, NPO status , Patient's Chart, lab work & pertinent test results  Airway Mallampati: II  TM Distance: >3 FB Neck ROM: Full    Dental  (+) Teeth Intact, Dental Advisory Given   Pulmonary shortness of breath, asthma , COPD,  COPD inhaler and oxygen dependent, former smoker,  Lung ca with mets    Pulmonary exam normal breath sounds clear to auscultation       Cardiovascular hypertension, Pt. on medications + angina Normal cardiovascular exam Rhythm:Regular Rate:Normal     Neuro/Psych  Headaches, BRAIN METASTASES S/p ACDF  Neuromuscular disease    GI/Hepatic Neg liver ROS, GERD  ,  Endo/Other  negative endocrine ROS  Renal/GU Renal InsufficiencyRenal disease     Musculoskeletal  (+) Arthritis , Osteoarthritis,    Abdominal   Peds  Hematology  (+) Blood dyscrasia, anemia ,   Anesthesia Other Findings Day of surgery medications reviewed with the patient.  Reproductive/Obstetrics                             Anesthesia Physical  Anesthesia Plan  ASA: 4  Anesthesia Plan: General   Post-op Pain Management: Minimal or no pain anticipated   Induction: Intravenous  PONV Risk Score and Plan: 3 and Dexamethasone, Ondansetron, Midazolam and Treatment may vary due to age or medical condition  Airway Management Planned: LMA  Additional Equipment: None  Intra-op Plan:   Post-operative Plan: Extubation in OR  Informed Consent: I have reviewed the patients History and Physical, chart, labs and discussed the procedure including the risks, benefits and alternatives for the proposed anesthesia with the patient or authorized representative who has indicated his/her understanding and acceptance.     Dental advisory given  Plan Discussed with: CRNA  Anesthesia Plan  Comments:         Anesthesia Quick Evaluation

## 2021-09-11 NOTE — Transfer of Care (Signed)
Immediate Anesthesia Transfer of Care Note  Patient: Elizabeth Mayer  Procedure(s) Performed: MRI OF BRAIN WITH AND WITHOUT CONTRAST WITH ANESTHESIA  Patient Location: PACU  Anesthesia Type:General  Level of Consciousness: drowsy and patient cooperative  Airway & Oxygen Therapy: Patient Spontanous Breathing and Patient connected to face mask oxygen  Post-op Assessment: Report given to RN and Post -op Vital signs reviewed and stable  Post vital signs: Reviewed and stable  Last Vitals:  Vitals Value Taken Time  BP 136/98 09/11/21 1124  Temp    Pulse 66 09/11/21 1129  Resp 15 09/11/21 1129  SpO2 100 % 09/11/21 1129  Vitals shown include unvalidated device data.  Last Pain:  Vitals:   09/11/21 0837  PainSc: 0-No pain         Complications: No notable events documented.

## 2021-09-11 NOTE — Anesthesia Postprocedure Evaluation (Signed)
Anesthesia Post Note  Patient: Elizabeth Mayer  Procedure(s) Performed: MRI OF BRAIN WITH AND WITHOUT CONTRAST WITH ANESTHESIA     Patient location during evaluation: PACU Anesthesia Type: General Level of consciousness: awake and alert Pain management: pain level controlled Vital Signs Assessment: post-procedure vital signs reviewed and stable Respiratory status: spontaneous breathing, nonlabored ventilation, respiratory function stable and patient connected to nasal cannula oxygen Cardiovascular status: blood pressure returned to baseline and stable Postop Assessment: no apparent nausea or vomiting Anesthetic complications: no   No notable events documented.  Last Vitals:  Vitals:   09/11/21 1139 09/11/21 1154  BP: (!) 153/77 (!) 141/75  Pulse: 66 68  Resp: 15 16  Temp:  (!) 36.4 C  SpO2: 100% 100%    Last Pain:  Vitals:   09/11/21 1154  PainSc: 0-No pain                 Tiajuana Amass

## 2021-09-12 ENCOUNTER — Encounter (HOSPITAL_COMMUNITY): Payer: Self-pay | Admitting: Radiology

## 2021-09-15 ENCOUNTER — Inpatient Hospital Stay: Payer: Medicaid Other

## 2021-09-15 ENCOUNTER — Ambulatory Visit: Payer: Medicaid Other | Admitting: Nurse Practitioner

## 2021-09-15 ENCOUNTER — Ambulatory Visit: Payer: Medicaid Other | Admitting: Internal Medicine

## 2021-09-16 ENCOUNTER — Ambulatory Visit: Payer: Medicaid Other | Admitting: Internal Medicine

## 2021-09-16 ENCOUNTER — Encounter: Payer: Self-pay | Admitting: Family Medicine

## 2021-09-16 ENCOUNTER — Ambulatory Visit (INDEPENDENT_AMBULATORY_CARE_PROVIDER_SITE_OTHER): Payer: Medicaid Other | Admitting: Family Medicine

## 2021-09-16 VITALS — BP 123/74 | HR 87 | Ht 66.0 in | Wt 148.2 lb

## 2021-09-16 DIAGNOSIS — Z01419 Encounter for gynecological examination (general) (routine) without abnormal findings: Secondary | ICD-10-CM

## 2021-09-16 DIAGNOSIS — Z1239 Encounter for other screening for malignant neoplasm of breast: Secondary | ICD-10-CM | POA: Diagnosis not present

## 2021-09-16 DIAGNOSIS — Z124 Encounter for screening for malignant neoplasm of cervix: Secondary | ICD-10-CM | POA: Diagnosis not present

## 2021-09-16 DIAGNOSIS — Z0000 Encounter for general adult medical examination without abnormal findings: Secondary | ICD-10-CM | POA: Diagnosis not present

## 2021-09-16 NOTE — Progress Notes (Signed)
GYNECOLOGY OFFICE VISIT NOTE  History:   Elizabeth Mayer is a 61 y.o. G2P0020 here today for annual exam.  Wondering about doing pap smear Has been referred for colonoscopy in the past but told to defer it in setting of cancer diagnosis   Health Maintenance Due  Topic Date Due   COVID-19 Vaccine (1) Never done   Zoster Vaccines- Shingrix (1 of 2) Never done   PAP SMEAR-Modifier  Never done    Past Medical History:  Diagnosis Date   Anemia    Angina 1982   related to stress   Asthma    in the past,  no current problems   Atrial fibrillation (HCC)    Complication of anesthesia    states anesthesia made her hair fall out   Constipation    Dyspnea    on oxygen at home - 3L via [place]   Fatigue    GERD (gastroesophageal reflux disease)    patient denies this dx   Headache    Heart murmur 1970s   no problems currently   Hypertension    Ingrown toenail    Lung cancer ([place]) 10/2019   metastatic disease to the brain   On home oxygen therapy    2L via [place] - 24 hours a day   Past heart attack 1980-1981   pt states she passed out and woke up in hospital- told she had heart attack, but then dr said he couldn't find anything wrong.   Pneumonia    x 1    Past Surgical History:  Procedure Laterality Date   CERVICAL DISC SURGERY  2000   Disc removed from neck    ingrown toe nail surgery Bilateral    IR IMAGING GUIDED PORT INSERTION  02/16/2020   MULTIPLE TOOTH EXTRACTIONS     for braces   RADIOLOGY WITH ANESTHESIA N/A 12/05/2019   Procedure: MRI BRAIN WITH AND WITHOUT CONTRAST;  Surgeon: Radiologist, Medication, MD;  Location: [place];  Service: Radiology;  Laterality: N/A;   RADIOLOGY WITH ANESTHESIA N/A 01/02/2020   Procedure: MRI BRAIN WITH AND WITHOUT CONTRAST;  Surgeon: Radiologist, Medication, MD;  Location: [place];  Service: Radiology;  Laterality: N/A;   RADIOLOGY WITH ANESTHESIA N/A 02/20/2020   Procedure: MRI WITH ANESTHESIA OF BRAIN WITH AND WITHOUT CONTRAST;  Surgeon:  Radiologist, Medication, MD;  Location: [place];  Service: Radiology;  Laterality: N/A;   RADIOLOGY WITH ANESTHESIA N/A 06/11/2020   Procedure: MRI WITH ANESTHESIA OF BRAIN WITH AND WITHOUT CONTRAST;  Surgeon: Radiologist, Medication, MD;  Location: [place];  Service: Radiology;  Laterality: N/A;   RADIOLOGY WITH ANESTHESIA N/A 10/10/2020   Procedure: MRI WITH ANESTHESIA BRAIN WITH AND WITHOUT CONTRAST;  Surgeon: Radiologist, Medication, MD;  Location: [place];  Service: Radiology;  Laterality: N/A;   RADIOLOGY WITH ANESTHESIA N/A 01/30/2021   Procedure: MRI BRAIN WITH AND WITHOUT CONTRASTWITH ANESTHESIA;  Surgeon: Radiologist, Medication, MD;  Location: [place];  Service: Radiology;  Laterality: N/A;   RADIOLOGY WITH ANESTHESIA N/A 06/12/2021   Procedure: MRI WITH ANESTHESIA OF BRAIN WITH AND WITHOUT CONTRAST;  Surgeon: Radiologist, Medication, MD;  Location: [place];  Service: Radiology;  Laterality: N/A;   RADIOLOGY WITH ANESTHESIA N/A 09/11/2021   Procedure: MRI OF BRAIN WITH AND WITHOUT CONTRAST WITH ANESTHESIA;  Surgeon: Radiologist, Medication, MD;  Location: [place];  Service: Radiology;  Laterality: N/A;   TONSILLECTOMY      The following portions of the patient's history were reviewed and updated as appropriate:  allergies, current medications, past family history, past medical history, past social history, past surgical history and problem list.   Health Maintenance:   Last pap: No results found for: "DIAGPAP", "HPV", "[place]" None on file  Last mammogram:  12/2020 - BIRADS 1   Review of Systems:  Pertinent items noted in HPI and remainder of comprehensive ROS otherwise negative.  Physical Exam:  BP 123/74   Pulse 87   Ht 5\' 6"  (1.676 m)   Wt 148 lb 3.2 oz (67.2 kg)   LMP  (LMP Unknown)   SpO2 100% Comment: 3 Liters O2  BMI 23.92 kg/m  CONSTITUTIONAL: Well-developed, well-nourished female in no acute distress.  HEENT:  Normocephalic, atraumatic. External right and left ear normal. No  scleral icterus.  NECK: Normal range of motion, supple, no masses noted on observation SKIN: No rash noted. Not diaphoretic. No erythema. No pallor. MUSCULOSKELETAL: Normal range of motion. No edema noted. NEUROLOGIC: Alert and oriented to person, place, and time. Normal muscle tone coordination.  PSYCHIATRIC: Normal mood and affect. Normal behavior. Normal judgment and thought content. RESPIRATORY: Effort normal, no problems with respiration noted  Labs and Imaging No results found for this or any previous visit (from the past 168 hour(s)). MR Brain W Wo Contrast  Result Date: 09/12/2021 CLINICAL DATA:  Brain/CNS neoplasm, assess treatment response; metastatic lung cancer EXAM: MRI HEAD WITHOUT AND WITH CONTRAST TECHNIQUE: Multiplanar, multiecho pulse sequences of the brain and surrounding structures were obtained without and with intravenous contrast. CONTRAST:  16mL GADAVIST GADOBUTROL 1 MMOL/ML IV SOLN COMPARISON:  06/12/2021 FINDINGS: Brain: Several enhancing lesions are again identified. There has been no change since the prior study. Minimally increased edema associated with left superior frontal gyrus and right precentral gyrus lesions (series 4, images 42 and 45). No new mass or abnormal enhancement. No mass effect. No acute infarction. Ventricles are stable in size. No extra-axial collection. Vascular: Major vessel flow voids at the skull base are preserved. Skull and upper cervical spine: Normal marrow signal is preserved. Sinuses/Orbits: Paranasal sinuses are aerated. Orbits are unremarkable. Other: Sella is unremarkable.  Mastoid air cells are clear. IMPRESSION: Minimally increased edema associated with two of the lesions. Otherwise, continued stability of treated metastatic lesions. No new metastatic disease. Electronically Signed   By: Macy Mis M.D.   On: 09/12/2021 08:36      Assessment and Plan:   Problem List Items Addressed This Visit   None Visit Diagnoses     Encounter for  gynecological examination without abnormal finding    -  Primary   Screening for malignant neoplasm of cervix       Breast screening       Relevant Orders   MM DIGITAL SCREENING BILATERAL     Cancer screening: - Pap: extended discussion with patient today about utility of this and possibility for increased intervention in setting of metastatic lung cancer. I told her that I felt it could be deferred unless cancer became a non-issue since that is much more likely to impact her life expectancy than anything I would screen for and find. After discussion patient preferred to skip the pap.  - Mammogram: ordered for repeat but again discussed with patient this could likely be deferred - Colonoscopy: previously discussed with and deferred by GI per patient  Contraception: Post menopausal  Mood:    08/26/2020    2:51 PM 07/31/2019    2:23 PM 01/19/2019   10:21 AM  PHQ9 SCORE ONLY  PHQ-9 Total Score  0 0 1    Vaccinations: - Flu: UTD - HPV: n/a - COVID: declines - PPSV23: recommended she discuss whether this would be indicated with her oncologist - Other: none  Metabolic: - DM: normal last year - Lipids: unremarkable last check  ID: - STI: declined screening as she states she has not been sexually active for some time  Routine preventative health maintenance measures emphasized. Please refer to After Visit Summary for other counseling recommendations.   Return in 1 year (on 09/17/2022) for Annual Wellness Visit.    Total face-to-face time with patient: 25 minutes.  Over 50% of encounter was spent on counseling and coordination of care.   Clarnce Flock, MD/MPH Attending Family Medicine Physician, [facility] for [facility], [place]

## 2021-09-18 ENCOUNTER — Telehealth: Payer: Self-pay | Admitting: Internal Medicine

## 2021-09-18 ENCOUNTER — Encounter (HOSPITAL_COMMUNITY): Payer: Self-pay

## 2021-09-18 ENCOUNTER — Inpatient Hospital Stay (HOSPITAL_BASED_OUTPATIENT_CLINIC_OR_DEPARTMENT_OTHER): Payer: Medicaid Other | Admitting: Internal Medicine

## 2021-09-18 ENCOUNTER — Other Ambulatory Visit: Payer: Self-pay

## 2021-09-18 VITALS — BP 149/73 | HR 85 | Temp 97.9°F | Resp 17 | Ht 66.0 in | Wt 149.9 lb

## 2021-09-18 DIAGNOSIS — C7931 Secondary malignant neoplasm of brain: Secondary | ICD-10-CM | POA: Diagnosis not present

## 2021-09-18 DIAGNOSIS — Z5112 Encounter for antineoplastic immunotherapy: Secondary | ICD-10-CM | POA: Diagnosis not present

## 2021-09-18 NOTE — Progress Notes (Signed)
[place] at [place], [place] [address] [phone]   Interval Evaluation  Date of Service: 09/18/21 Patient Name: Elizabeth Mayer Patient MRN: [id number] Patient DOB: 04-Aug-1960 Provider: Ventura Sellers, MD  Identifying Statement:  Elizabeth Mayer is a 61 y.o. female with Malignant neoplasm metastatic to brain [facility]) [C79.31]   Primary Cancer:  Oncologic History: Oncology History  Non-small cell lung cancer, right ([place])  11/08/2019 Initial Diagnosis   Non-small cell lung cancer, right ([place])   12/14/2019 - 12/14/2019 Chemotherapy   The patient had cemiplimab-rwlc (LIBTAYO) 350 mg in sodium chloride 0.9 % 100 mL chemo infusion, 350 mg, Intravenous, Once, 0 of 6 cycles  for chemotherapy treatment.    12/14/2019 -  Chemotherapy   Patient is on Treatment Plan : LUNG CARBOplatin / Pemetrexed / Pembrolizumab q21d Induction x 4 cycles / Maintenance Pemetrexed + Pembrolizumab      CNS Oncologic History 01/12/20: SRS to 22 targets Elizabeth Mayer) 02/11/21: SRS L frontal Elizabeth Mayer)  Interval History:  Elizabeth Mayer presents today for follow up after recent MRI brain with anesthesia.  Denies any clinical changes, she is on a chemo holiday with Dr. Lorenso Courier.  Headaches are sporadic.  No issues with her gait.    H+P (03/14/20) Patient presents today for follow up after recent MRI brain.  She describes no new or progressive neurologic deficits.  No seizures or headaches.  Still independent with gait and activities of daily living.  Continues on chemotherapy with Dr. Julien Nordmann.  Currently taking decadron 1mg  daily.  Medications: Current Outpatient Medications on File Prior to Visit  Medication Sig Dispense Refill   acetaminophen (TYLENOL) 500 MG tablet Take 1,000 mg by mouth 2 (two) times daily as needed (pain.).     albuterol (VENTOLIN HFA) 108 (90 Base) MCG/ACT inhaler Inhale 1-2 puffs into the lungs See admin instructions. Use 1 inhalation twice daily  (scheduled) & may use every 6 hours as needed for wheezing/shortness of breath     amLODipine (NORVASC) 5 MG tablet Take 1 tablet (5 mg total) by mouth daily. 30 tablet 2   benzoyl peroxide (CERAVE ACNE FOAMING CREAM) 4 % external liquid Apply 1 application topically 2 (two) times daily. Face     bisacodyl (DULCOLAX) 5 MG EC tablet Take 5 mg by mouth daily as needed for moderate constipation.     Blood Pressure Monitoring (BLOOD PRESSURE MONITOR AUTOMAT) DEVI Check Blood pressure at home daily 1 each 0   Carboxymethylcellulose Sodium (REFRESH CELLUVISC OP) Place 2 drops into both eyes in the morning and at bedtime.     cetaphil (CETAPHIL) lotion Apply 1 application. topically as needed for dry skin.     clindamycin (CLEOCIN T) 1 % external solution Apply 1 application topically 2 (two) times daily.     cyclobenzaprine (FLEXERIL) 10 MG tablet Take 1 tablet (10 mg total) by mouth 3 (three) times daily as needed for muscle spasms. 30 tablet 0   diltiazem (CARDIZEM) 30 MG tablet Take 1 tablet (30 mg total) by mouth 4 (four) times daily. (Patient taking differently: Take 30 mg by mouth 4 (four) times daily as needed (afib episodes).) 120 tablet 1   docusate sodium (COLACE) 100 MG capsule Take 100 mg by mouth 2 (two) times daily as needed for moderate constipation. (Patient not taking: Reported on 09/16/2021)     fexofenadine (ALLEGRA) 180 MG tablet Take 180 mg by mouth in the morning.     fluticasone (  FLONASE) 50 MCG/ACT nasal spray Place 1 spray into both nostrils in the morning.     folic acid (FOLVITE) 1 MG tablet Take 1 tablet (1 mg total) by mouth daily. 90 tablet 1   guaiFENesin (MUCINEX) 600 MG 12 hr tablet Take 600 mg by mouth 2 (two) times daily.      HYDROcodone-acetaminophen (NORCO/VICODIN) 5-325 MG tablet Take 1 tablet by mouth every 6 (six) hours as needed for moderate pain. 30 tablet 0   lidocaine (XYLOCAINE) 2 % solution Use as directed 15 mLs in the mouth or throat as needed for mouth pain.  Swallow 30 min prior to meals and at bedtime for throat/esophagus pain (Patient not taking: Reported on 09/16/2021) 250 mL 1   lidocaine-prilocaine (EMLA) cream Apply 1 application topically as needed. (Patient taking differently: Apply 1 application  topically every 21 ( twenty-one) days. Every three weeks with infusion via port) 30 g 2   Multiple Vitamin (MULTIVITAMIN WITH MINERALS) TABS tablet Take 1 tablet by mouth daily. Alive     ondansetron (ZOFRAN) 8 MG tablet Take 1 tablet (8 mg total) by mouth every 8 (eight) hours as needed for nausea or vomiting. 20 tablet 0   OXYGEN Inhale 3 L/min into the lungs See admin instructions. At bedtime & as needed during the day     Pembrolizumab (KEYTRUDA IV) Inject into the vein. Every 3 weeks (Patient not taking: Reported on 09/16/2021)     Polyethyl Glycol-Propyl Glycol (LUBRICANT EYE DROPS) 0.4-0.3 % SOLN Apply 1-2 drops to eye 3 (three) times daily as needed (dry/irritated eyes).     Soap & Cleansers (MEDERMA AG FACIAL TONER) LIQD Apply 1 application topically 2 (two) times daily. Advance dry skin     SYMBICORT 80-4.5 MCG/ACT inhaler INHALE 2 PUFFS BY MOUTH IN THE MORNING AND AT BEDTIME 11 g 5   triamcinolone ointment (KENALOG) 0.1 % Apply 1 application topically in the morning and at bedtime.     No current facility-administered medications on file prior to visit.    Allergies:  Allergies  Allergen Reactions   Sudafed [Pseudoephedrine] Hypertension   Gabapentin Other (See Comments)    Raise blood pressure and red rings around eyes Blood vessels popped in her eyes   Mobic [Meloxicam] Swelling    Inflamed the area that has inflammation and stabbing pains in the area   Penicillins Hives     Childhood reaction   Prednisone     Caused A-fib   Past Medical History:  Past Medical History:  Diagnosis Date   Anemia    Angina 1982   related to stress   Asthma    in the past,  no current problems   Atrial fibrillation (HCC)    Complication of  anesthesia    states anesthesia made her hair fall out   Constipation    Dyspnea    on oxygen at home - 3L via [place]   Fatigue    GERD (gastroesophageal reflux disease)    patient denies this dx   Headache    Heart murmur 1970s   no problems currently   Hypertension    Ingrown toenail    Lung cancer ([place]) 10/2019   metastatic disease to the brain   On home oxygen therapy    2L via [place] - 24 hours a day   Past heart attack 1980-1981   pt states she passed out and woke up in hospital- told she had heart attack, but then dr said he couldn't find  anything wrong.   Pneumonia    x 1   Past Surgical History:  Past Surgical History:  Procedure Laterality Date   CERVICAL DISC SURGERY  2000   Disc removed from neck    ingrown toe nail surgery Bilateral    IR IMAGING GUIDED PORT INSERTION  02/16/2020   MULTIPLE TOOTH EXTRACTIONS     for braces   RADIOLOGY WITH ANESTHESIA N/A 12/05/2019   Procedure: MRI BRAIN WITH AND WITHOUT CONTRAST;  Surgeon: Radiologist, Medication, MD;  Location: [place];  Service: Radiology;  Laterality: N/A;   RADIOLOGY WITH ANESTHESIA N/A 01/02/2020   Procedure: MRI BRAIN WITH AND WITHOUT CONTRAST;  Surgeon: Radiologist, Medication, MD;  Location: [place];  Service: Radiology;  Laterality: N/A;   RADIOLOGY WITH ANESTHESIA N/A 02/20/2020   Procedure: MRI WITH ANESTHESIA OF BRAIN WITH AND WITHOUT CONTRAST;  Surgeon: Radiologist, Medication, MD;  Location: [place];  Service: Radiology;  Laterality: N/A;   RADIOLOGY WITH ANESTHESIA N/A 06/11/2020   Procedure: MRI WITH ANESTHESIA OF BRAIN WITH AND WITHOUT CONTRAST;  Surgeon: Radiologist, Medication, MD;  Location: [place];  Service: Radiology;  Laterality: N/A;   RADIOLOGY WITH ANESTHESIA N/A 10/10/2020   Procedure: MRI WITH ANESTHESIA BRAIN WITH AND WITHOUT CONTRAST;  Surgeon: Radiologist, Medication, MD;  Location: [place];  Service: Radiology;  Laterality: N/A;   RADIOLOGY WITH ANESTHESIA N/A 01/30/2021   Procedure: MRI BRAIN WITH AND  WITHOUT CONTRASTWITH ANESTHESIA;  Surgeon: Radiologist, Medication, MD;  Location: [place];  Service: Radiology;  Laterality: N/A;   RADIOLOGY WITH ANESTHESIA N/A 06/12/2021   Procedure: MRI WITH ANESTHESIA OF BRAIN WITH AND WITHOUT CONTRAST;  Surgeon: Radiologist, Medication, MD;  Location: [place];  Service: Radiology;  Laterality: N/A;   RADIOLOGY WITH ANESTHESIA N/A 09/11/2021   Procedure: MRI OF BRAIN WITH AND WITHOUT CONTRAST WITH ANESTHESIA;  Surgeon: Radiologist, Medication, MD;  Location: [place];  Service: Radiology;  Laterality: N/A;   TONSILLECTOMY     Social History:  Social History   Socioeconomic History   Marital status: Legally Separated    Spouse name: Not on file   Number of children: 0   Years of education: Not on file   Highest education level: Not on file  Occupational History   Occupation: Secondary school teacher  Tobacco Use   Smoking status: Former    Packs/day: 1.00    Years: 20.00    Total pack years: 20.00    Types: Cigarettes    Start date: 71    Quit date: 2001    Years since quitting: 22.4   Smokeless tobacco: Never  Vaping Use   Vaping Use: Never used  Substance and Sexual Activity   Alcohol use: No   Drug use: No   Sexual activity: Not Currently    Birth control/protection: None, Post-menopausal  Other Topics Concern   Not on file  Social History Narrative   Not on file   Social Determinants of Health   Financial Resource Strain: High Risk (11/10/2019)   Overall Financial Resource Strain (CARDIA)    Difficulty of Paying Living Expenses: Hard  Food Insecurity: Food Insecurity Present (09/16/2021)   Hunger Vital Sign    Worried About Running Out of Food in the Last Year: Never true    Ran Out of Food in the Last Year: Sometimes true  Transportation Needs: No Transportation Needs (09/16/2021)   PRAPARE - Hydrologist (Medical): No    Lack of Transportation (Non-Medical): No  Physical Activity: Inactive (  11/10/2019)   Exercise  Vital Sign    Days of Exercise per Week: 0 days    Minutes of Exercise per Session: 0 min  Stress: Not on file  Social Connections: Moderately Isolated (11/10/2019)   Social Connection and Isolation Panel [NHANES]    Frequency of Communication with Friends and Family: Twice a week    Frequency of Social Gatherings with Friends and Family: Twice a week    Attends Religious Services: More than 4 times per year    Active Member of [employer] or Organizations: No    Attends Music therapist: Not on file    Marital Status: Separated  Intimate Partner Violence: Not on file   Family History:  Family History  Problem Relation Age of Onset   Arthritis Mother    Heart failure Father     Review of Systems: Constitutional: Doesn't report fevers, chills or abnormal weight loss Eyes: Doesn't report blurriness of vision Ears, nose, mouth, throat, and face: Doesn't report sore throat Respiratory: Doesn't report cough, dyspnea or wheezes Cardiovascular: Doesn't report palpitation, chest discomfort  Gastrointestinal:  Doesn't report nausea, constipation, diarrhea GU: Doesn't report incontinence Skin: Doesn't report skin rashes Neurological: Per HPI Musculoskeletal: Doesn't report joint pain Behavioral/Psych: Doesn't report anxiety  Physical Exam: Vitals:   09/18/21 1125  BP: (!) 149/73  Pulse: 85  Resp: 17  Temp: 97.9 F (36.6 C)  SpO2: 98%    KPS: 90. General: normal Head: Normal EENT: No conjunctival injection or scleral icterus.  Lungs: Resp effort normal Cardiac: Regular rate Abdomen: Non-distended abdomen Skin: No rashes cyanosis or petechiae. Extremities: No clubbing or edema  Neurologic Exam: Mental Status: Awake, alert, attentive to examiner. Oriented to self and environment. Language is fluent with intact comprehension.  Cranial Nerves: Visual acuity is grossly normal. Visual fields are full. Extra-ocular movements intact. No ptosis. Face is symmetric Motor:  Tone and bulk are normal. Power is full in both arms and legs. Reflexes are symmetric, no pathologic reflexes present.  Sensory: Intact to light touch Gait: Normal.   Labs: I have reviewed the data as listed    Component Value Date/Time   NA 143 09/05/2021 1057   NA 145 (H) 05/13/2021 1220   K 3.4 (L) 09/05/2021 1057   CL 107 09/05/2021 1057   CO2 30 09/05/2021 1057   GLUCOSE 82 09/05/2021 1057   BUN 13 09/05/2021 1057   BUN 12 05/13/2021 1220   CREATININE 1.22 (H) 09/05/2021 1057   CALCIUM 9.7 09/05/2021 1057   PROT 6.9 09/05/2021 1057   PROT 6.8 08/26/2020 1546   ALBUMIN 3.5 09/05/2021 1057   ALBUMIN 4.1 08/26/2020 1546   AST 20 09/05/2021 1057   ALT 11 09/05/2021 1057   ALKPHOS 44 09/05/2021 1057   BILITOT 0.5 09/05/2021 1057   GFRNONAA 50 (L) 09/05/2021 1057   GFRAA >60 01/08/2020 1342   GFRAA >60 01/04/2020 0907   Lab Results  Component Value Date   WBC 4.0 09/05/2021   NEUTROABS 2.3 09/05/2021   HGB 10.3 (L) 09/05/2021   HCT 31.5 (L) 09/05/2021   MCV 94.0 09/05/2021   PLT 136 (L) 09/05/2021    Imaging:  MR Brain W Wo Contrast  Result Date: 09/12/2021 CLINICAL DATA:  Brain/CNS neoplasm, assess treatment response; metastatic lung cancer EXAM: MRI HEAD WITHOUT AND WITH CONTRAST TECHNIQUE: Multiplanar, multiecho pulse sequences of the brain and surrounding structures were obtained without and with intravenous contrast. CONTRAST:  29mL GADAVIST GADOBUTROL 1 MMOL/ML IV SOLN COMPARISON:  06/12/2021 FINDINGS: Brain: Several enhancing lesions are again identified. There has been no change since the prior study. Minimally increased edema associated with left superior frontal gyrus and right precentral gyrus lesions (series 4, images 42 and 45). No new mass or abnormal enhancement. No mass effect. No acute infarction. Ventricles are stable in size. No extra-axial collection. Vascular: Major vessel flow voids at the skull base are preserved. Skull and upper cervical spine: Normal  marrow signal is preserved. Sinuses/Orbits: Paranasal sinuses are aerated. Orbits are unremarkable. Other: Sella is unremarkable.  Mastoid air cells are clear. IMPRESSION: Minimally increased edema associated with two of the lesions. Otherwise, continued stability of treated metastatic lesions. No new metastatic disease. Electronically Signed   By: Macy Mis M.D.   On: 09/12/2021 08:36     [place] Clinician Interpretation: I have personally reviewed the radiological images as listed.  My interpretation, in the context of the patient's clinical presentation, is likely treatment effect   Assessment/Plan Malignant neoplasm metastatic to brain [facility]) [C79.31]  Maleni A Kovack is clinically stable today.  Brain MRI demonstrates modest increase in T2/FLAIR signal burden surrounding two lesions, this is an asymptomatic change.  For headaches, con't PRN analgesia.   We appreciate the opportunity to participate in the care of [place].    We ask that Kiefer return to clinic in 4 months following post-SRS MRI, or sooner as needed.  All questions were answered. The patient knows to call the clinic with any problems, questions or concerns. No barriers to learning were detected.  The total time spent in the encounter was 30 minutes and more than 50% was on counseling and review of test results   Ventura Sellers, MD Medical Director of Neuro-Oncology [place] [facility] at [place] 09/18/21 11:18 AM

## 2021-09-18 NOTE — Telephone Encounter (Signed)
Per 6/15 called and spoke to pt about appointment  pt confirmed appointment

## 2021-09-19 ENCOUNTER — Other Ambulatory Visit: Payer: Self-pay | Admitting: Emergency Medicine

## 2021-09-25 ENCOUNTER — Other Ambulatory Visit: Payer: Self-pay

## 2021-09-25 ENCOUNTER — Inpatient Hospital Stay (HOSPITAL_BASED_OUTPATIENT_CLINIC_OR_DEPARTMENT_OTHER): Payer: Medicaid Other | Admitting: Hematology and Oncology

## 2021-09-25 ENCOUNTER — Inpatient Hospital Stay: Payer: Medicaid Other

## 2021-09-25 VITALS — BP 149/62 | HR 92 | Temp 97.8°F | Resp 16 | Ht 66.0 in | Wt 148.9 lb

## 2021-09-25 DIAGNOSIS — C3491 Malignant neoplasm of unspecified part of right bronchus or lung: Secondary | ICD-10-CM | POA: Diagnosis not present

## 2021-09-25 DIAGNOSIS — Z95828 Presence of other vascular implants and grafts: Secondary | ICD-10-CM

## 2021-09-25 DIAGNOSIS — C7931 Secondary malignant neoplasm of brain: Secondary | ICD-10-CM

## 2021-09-25 DIAGNOSIS — Z5112 Encounter for antineoplastic immunotherapy: Secondary | ICD-10-CM | POA: Diagnosis not present

## 2021-09-25 LAB — CBC WITH DIFFERENTIAL (CANCER CENTER ONLY)
Abs Immature Granulocytes: 0.01 10*3/uL (ref 0.00–0.07)
Basophils Absolute: 0 10*3/uL (ref 0.0–0.1)
Basophils Relative: 1 %
Eosinophils Absolute: 0.1 10*3/uL (ref 0.0–0.5)
Eosinophils Relative: 3 %
HCT: 32.8 % — ABNORMAL LOW (ref 36.0–46.0)
Hemoglobin: 10.9 g/dL — ABNORMAL LOW (ref 12.0–15.0)
Immature Granulocytes: 0 %
Lymphocytes Relative: 26 %
Lymphs Abs: 1.1 10*3/uL (ref 0.7–4.0)
MCH: 30.4 pg (ref 26.0–34.0)
MCHC: 33.2 g/dL (ref 30.0–36.0)
MCV: 91.6 fL (ref 80.0–100.0)
Monocytes Absolute: 0.3 10*3/uL (ref 0.1–1.0)
Monocytes Relative: 6 %
Neutro Abs: 2.7 10*3/uL (ref 1.7–7.7)
Neutrophils Relative %: 64 %
Platelet Count: 142 10*3/uL — ABNORMAL LOW (ref 150–400)
RBC: 3.58 MIL/uL — ABNORMAL LOW (ref 3.87–5.11)
RDW: 12.9 % (ref 11.5–15.5)
WBC Count: 4.2 10*3/uL (ref 4.0–10.5)
nRBC: 0 % (ref 0.0–0.2)

## 2021-09-25 LAB — CMP (CANCER CENTER ONLY)
ALT: 8 U/L (ref 0–44)
AST: 15 U/L (ref 15–41)
Albumin: 3.9 g/dL (ref 3.5–5.0)
Alkaline Phosphatase: 41 U/L (ref 38–126)
Anion gap: 5 (ref 5–15)
BUN: 15 mg/dL (ref 8–23)
CO2: 30 mmol/L (ref 22–32)
Calcium: 9.5 mg/dL (ref 8.9–10.3)
Chloride: 107 mmol/L (ref 98–111)
Creatinine: 1.18 mg/dL — ABNORMAL HIGH (ref 0.44–1.00)
GFR, Estimated: 52 mL/min — ABNORMAL LOW (ref >60)
Glucose, Bld: 133 mg/dL — ABNORMAL HIGH (ref 70–99)
Potassium: 3.5 mmol/L (ref 3.5–5.1)
Sodium: 142 mmol/L (ref 135–145)
Total Bilirubin: 0.3 mg/dL (ref 0.3–1.2)
Total Protein: 6.8 g/dL (ref 6.5–8.1)

## 2021-09-25 LAB — TSH: TSH: 1.613 u[IU]/mL (ref 0.350–4.500)

## 2021-09-25 MED ORDER — HEPARIN SOD (PORK) LOCK FLUSH 100 UNIT/ML IV SOLN
500.0000 [IU] | Freq: Once | INTRAVENOUS | Status: AC | PRN
  Administered 2021-09-25: 500 [IU]

## 2021-09-25 MED ORDER — SODIUM CHLORIDE 0.9% FLUSH
10.0000 mL | INTRAVENOUS | Status: DC | PRN
  Administered 2021-09-25: 10 mL

## 2021-09-25 MED ORDER — SODIUM CHLORIDE 0.9% FLUSH
10.0000 mL | Freq: Once | INTRAVENOUS | Status: AC
  Administered 2021-09-25: 10 mL

## 2021-09-25 MED ORDER — CYANOCOBALAMIN 1000 MCG/ML IJ SOLN
1000.0000 ug | Freq: Once | INTRAMUSCULAR | Status: AC
  Administered 2021-09-25: 1000 ug via INTRAMUSCULAR
  Filled 2021-09-25: qty 1

## 2021-09-25 MED ORDER — SODIUM CHLORIDE 0.9 % IV SOLN: Freq: Once | INTRAVENOUS | Status: AC

## 2021-09-25 MED ORDER — SODIUM CHLORIDE 0.9 % IV SOLN
200.0000 mg | Freq: Once | INTRAVENOUS | Status: AC
  Administered 2021-09-25: 200 mg via INTRAVENOUS
  Filled 2021-09-25: qty 200

## 2021-09-25 MED ORDER — PROCHLORPERAZINE MALEATE 10 MG PO TABS
10.0000 mg | ORAL_TABLET | Freq: Once | ORAL | Status: AC
  Administered 2021-09-25: 10 mg via ORAL
  Filled 2021-09-25: qty 1

## 2021-09-25 NOTE — Progress Notes (Unsigned)
[facility] Telephone:([phone]   [phone]) [phone]  PROGRESS NOTE  Patient Care Team: Minette Brine, FNP as PCP - General (General Practice)  Hematological/Oncological History # Metastatic Adenocarcinoma of the Lung #Brain Metastasis 1) 11/08/2019: establish care with Dr. Julien Nordmann and his PA Cassie Heilingoetter. Noted to have widely metastatic lung cancer with involvement of the brain, lymph nodes, and right lung.  2) 12/04/2019: palliative radiation to the right lung mass/cervical adenopathy 3) 9/7/02021: started therapy with Carbo/Pem/Pem 4) 9/29-10/09/2019: patient underwent SRS to the brain mets 5) 03/22/2020: repeat CT C/A/P and neck scheduled. Transfer care to Dr. Lorenso Courier.   6) 03/22/2020: CT C/A/P showed interval significant improvement in previously demonstrated extensive confluent lymphadenopathy in the superior mediastinum and right hilar regions. 7) 08/15/2020: Holding chemotherapy due to poor Hgb and fatigue. Allowing patient a brief 'chemo holiday' to rebound appropriately from last cycle.  8) 08/29/2020: restarted chemotherapy, Cycle 12 Day 1 9) 09/19/2020: patient requested to HOLD Cycle 13 in setting of fatigue/weakness.  10) 11/29/2020: Cycle 14 Day 1 of chemotherapy, delayed start due to insurance issues.   Interval History:  Elizabeth Mayer 61 y.o. female with medical history significant for metastatic adenocarcinoma of the lung who presents for follow up. The patient's last visit was on 07/25/2021.  In the interim since her last visit she has been on a chemotherapy holiday.  On exam today Elizabeth Mayer reports she is not feeling as well as she would have hoped after a chemotherapy holiday.  She reports that the swelling is still present but it is somewhat improved.  She notes that when she sits down to list her leg she sometimes feels discomfort in her abdomen.  She has not been wearing compression socks due to the issues she had with her toenail.  She reports  that the swelling is up to approximately the shins and is about a +1 pitting edema.  She continues to have some occasional issues with headache and dizziness.  She notes that she would like to continue her chemotherapy holiday at this time.  She denies any nausea, vomiting, or diarrhea.  She denies fevers, chills, night sweats, chest pain or cough. She has no other complaints.  A full 10 point ROS is listed below.  MEDICAL HISTORY:  Past Medical History:  Diagnosis Date   Anemia    Angina 1982   related to stress   Asthma    in the past,  no current problems   Atrial fibrillation (HCC)    Complication of anesthesia    states anesthesia made her hair fall out   Constipation    Dyspnea    on oxygen at home - 3L via [place]   Fatigue    GERD (gastroesophageal reflux disease)    patient denies this dx   Headache    Heart murmur 1970s   no problems currently   Hypertension    Ingrown toenail    Lung cancer ([place]) 10/2019   metastatic disease to the brain   On home oxygen therapy    2L via [place] - 24 hours a day   Past heart attack 1980-1981   pt states she passed out and woke up in hospital- told she had heart attack, but then dr said he couldn't find anything wrong.   Pneumonia    x 1    SURGICAL HISTORY: Past Surgical History:  Procedure Laterality Date   CERVICAL DISC SURGERY  2000   Disc removed from neck  ingrown toe nail surgery Bilateral    IR IMAGING GUIDED PORT INSERTION  02/16/2020   MULTIPLE TOOTH EXTRACTIONS     for braces   RADIOLOGY WITH ANESTHESIA N/A 12/05/2019   Procedure: MRI BRAIN WITH AND WITHOUT CONTRAST;  Surgeon: Radiologist, Medication, MD;  Location: [facility];  Service: Radiology;  Laterality: N/A;   RADIOLOGY WITH ANESTHESIA N/A 01/02/2020   Procedure: MRI BRAIN WITH AND WITHOUT CONTRAST;  Surgeon: Radiologist, Medication, MD;  Location: [facility];  Service: Radiology;  Laterality: N/A;   RADIOLOGY WITH ANESTHESIA N/A 02/20/2020   Procedure: MRI WITH ANESTHESIA OF  BRAIN WITH AND WITHOUT CONTRAST;  Surgeon: Radiologist, Medication, MD;  Location: [facility];  Service: Radiology;  Laterality: N/A;   RADIOLOGY WITH ANESTHESIA N/A 06/11/2020   Procedure: MRI WITH ANESTHESIA OF BRAIN WITH AND WITHOUT CONTRAST;  Surgeon: Radiologist, Medication, MD;  Location: [facility];  Service: Radiology;  Laterality: N/A;   RADIOLOGY WITH ANESTHESIA N/A 10/10/2020   Procedure: MRI WITH ANESTHESIA BRAIN WITH AND WITHOUT CONTRAST;  Surgeon: Radiologist, Medication, MD;  Location: [facility];  Service: Radiology;  Laterality: N/A;   RADIOLOGY WITH ANESTHESIA N/A 01/30/2021   Procedure: MRI BRAIN WITH AND WITHOUT CONTRASTWITH ANESTHESIA;  Surgeon: Radiologist, Medication, MD;  Location: [facility];  Service: Radiology;  Laterality: N/A;   RADIOLOGY WITH ANESTHESIA N/A 06/12/2021   Procedure: MRI WITH ANESTHESIA OF BRAIN WITH AND WITHOUT CONTRAST;  Surgeon: Radiologist, Medication, MD;  Location: [facility];  Service: Radiology;  Laterality: N/A;   RADIOLOGY WITH ANESTHESIA N/A 09/11/2021   Procedure: MRI OF BRAIN WITH AND WITHOUT CONTRAST WITH ANESTHESIA;  Surgeon: Radiologist, Medication, MD;  Location: [facility];  Service: Radiology;  Laterality: N/A;   TONSILLECTOMY      SOCIAL HISTORY: Social History   Socioeconomic History   Marital status: Legally Separated    Spouse name: Not on file   Number of children: 0   Years of education: Not on file   Highest education level: Not on file  Occupational History   Occupation: Secondary school teacher  Tobacco Use   Smoking status: Former    Packs/day: 1.00    Years: 20.00    Total pack years: 20.00    Types: Cigarettes    Start date: 62    Quit date: 2001    Years since quitting: 22.4   Smokeless tobacco: Never  Vaping Use   Vaping Use: Never used  Substance and Sexual Activity   Alcohol use: No   Drug use: No   Sexual activity: Not Currently    Birth control/protection: None, Post-menopausal  Other Topics Concern   Not on file  Social History  Narrative   Not on file   Social Determinants of Health   Financial Resource Strain: High Risk (11/10/2019)   Overall Financial Resource Strain (CARDIA)    Difficulty of Paying Living Expenses: Hard  Food Insecurity: Food Insecurity Present (09/16/2021)   Hunger Vital Sign    Worried About Running Out of Food in the Last Year: Never true    Ran Out of Food in the Last Year: Sometimes true  Transportation Needs: No Transportation Needs (09/16/2021)   PRAPARE - Hydrologist (Medical): No    Lack of Transportation (Non-Medical): No  Physical Activity: Inactive (11/10/2019)   Exercise Vital Sign    Days of Exercise per Week: 0 days    Minutes of Exercise per Session: 0 min  Stress: Not on file  Social Connections: Moderately Isolated (11/10/2019)  Social Licensed conveyancer [NHANES]    Frequency of Communication with Friends and Family: Twice a week    Frequency of Social Gatherings with Friends and Family: Twice a week    Attends Religious Services: More than 4 times per year    Active Member of [employer] or Organizations: No    Attends Music therapist: Not on file    Marital Status: Separated  Intimate Partner Violence: Not on file    FAMILY HISTORY: Family History  Problem Relation Age of Onset   Arthritis Mother    Heart failure Father     ALLERGIES:  is allergic to sudafed [pseudoephedrine], gabapentin, mobic [meloxicam], penicillins, and prednisone.  MEDICATIONS:  Current Outpatient Medications  Medication Sig Dispense Refill   acetaminophen (TYLENOL) 500 MG tablet Take 1,000 mg by mouth 2 (two) times daily as needed (pain.).     amLODipine (NORVASC) 5 MG tablet Take 1 tablet (5 mg total) by mouth daily. 30 tablet 2   benzoyl peroxide (CERAVE ACNE FOAMING CREAM) 4 % external liquid Apply 1 application topically 2 (two) times daily. Face     Blood Pressure Monitoring (BLOOD PRESSURE MONITOR AUTOMAT) DEVI Check Blood pressure at  home daily 1 each 0   Carboxymethylcellulose Sodium (REFRESH CELLUVISC OP) Place 2 drops into both eyes in the morning and at bedtime.     cetaphil (CETAPHIL) lotion Apply 1 application. topically as needed for dry skin.     clindamycin (CLEOCIN T) 1 % external solution Apply 1 application topically 2 (two) times daily.     cyclobenzaprine (FLEXERIL) 10 MG tablet Take 1 tablet (10 mg total) by mouth 3 (three) times daily as needed for muscle spasms. 30 tablet 0   diltiazem (CARDIZEM) 30 MG tablet Take 1 tablet (30 mg total) by mouth 4 (four) times daily. (Patient taking differently: Take 30 mg by mouth 4 (four) times daily as needed (afib episodes).) 120 tablet 1   fexofenadine (ALLEGRA) 180 MG tablet Take 180 mg by mouth in the morning.     fluticasone (FLONASE) 50 MCG/ACT nasal spray Place 1 spray into both nostrils in the morning.     folic acid (FOLVITE) 1 MG tablet Take 1 tablet (1 mg total) by mouth daily. 90 tablet 1   guaiFENesin (MUCINEX) 600 MG 12 hr tablet Take 600 mg by mouth 2 (two) times daily.      HYDROcodone-acetaminophen (NORCO/VICODIN) 5-325 MG tablet Take 1 tablet by mouth every 6 (six) hours as needed for moderate pain. 30 tablet 0   lidocaine-prilocaine (EMLA) cream Apply 1 application topically as needed. 30 g 2   Multiple Vitamin (MULTIVITAMIN WITH MINERALS) TABS tablet Take 1 tablet by mouth daily. Alive     ondansetron (ZOFRAN) 8 MG tablet Take 1 tablet (8 mg total) by mouth every 8 (eight) hours as needed for nausea or vomiting. 20 tablet 0   OXYGEN Inhale 3 L/min into the lungs See admin instructions. At bedtime & as needed during the day     Pembrolizumab (KEYTRUDA IV) Inject into the vein. Every 3 weeks     Polyethyl Glycol-Propyl Glycol (LUBRICANT EYE DROPS) 0.4-0.3 % SOLN Apply 1-2 drops to eye 3 (three) times daily as needed (dry/irritated eyes).     Soap & Cleansers (MEDERMA AG FACIAL TONER) LIQD Apply 1 application topically 2 (two) times daily. Advance dry skin      SYMBICORT 80-4.5 MCG/ACT inhaler INHALE 2 PUFFS BY MOUTH IN THE MORNING AND AT BEDTIME 11 g  5   triamcinolone ointment (KENALOG) 0.1 % Apply 1 application topically in the morning and at bedtime.     VENTOLIN HFA 108 (90 Base) MCG/ACT inhaler INHALE 1 TO 2 PUFFS BY MOUTH EVERY 6 HOURS AS NEEDED FOR WHEEZING OR SHORTNESS OF BREATH 18 g 3   No current facility-administered medications for this visit.    REVIEW OF SYSTEMS:   Constitutional: ( - ) fevers, ( - )  chills , ( - ) night sweats Eyes: ( - ) blurriness of vision, ( - ) double vision, ( - ) watery eyes Ears, nose, mouth, throat, and face: ( - ) mucositis, ( - ) sore throat Respiratory: ( - ) cough, ( - ) dyspnea, ( - ) wheezes Cardiovascular: ( - ) palpitation, ( - ) chest discomfort, ( +) lower extremity swelling Gastrointestinal:  ( - ) nausea, ( - ) heartburn, ( - ) change in bowel habits Skin: ( - ) abnormal skin rashes Lymphatics: ( - ) new lymphadenopathy, ( - ) easy bruising Neurological: ( - ) numbness, ( - ) tingling, ( - ) new weaknesses Behavioral/Psych: ( - ) mood change, ( - ) new changes  All other systems were reviewed with the patient and are negative.  PHYSICAL EXAMINATION: ECOG PERFORMANCE STATUS: 2 - Symptomatic, <50% confined to bed  Vitals:   09/25/21 1036  BP: (!) 149/62  Pulse: 92  Resp: 16  Temp: 97.8 F (36.6 C)  SpO2: 99%     Filed Weights   09/25/21 1036  Weight: 148 lb 14.4 oz (67.5 kg)     GENERAL: well appearing middle aged [place] American female. alert, no distress and comfortable SKIN: skin color, texture, turgor are normal, no rashes or significant lesions EYES: conjunctiva are pink and non-injected, sclera clear LUNGS: clear to auscultation and percussion with normal breathing effort HEART: regular rate & rhythm and no murmurs and +1 pitting bilateral lower extremity edema Musculoskeletal: no cyanosis of digits and no clubbing.  PSYCH: alert & oriented x 3, fluent speech NEURO:  no focal motor/sensory deficits   LABORATORY DATA:  I have reviewed the data as listed    Latest Ref Rng & Units 09/25/2021   10:17 AM 09/05/2021   10:57 AM 07/25/2021   11:19 AM  CBC  WBC 4.0 - 10.5 K/uL 4.2  4.0  4.3   Hemoglobin 12.0 - 15.0 g/dL 10.9  10.3  9.5   Hematocrit 36.0 - 46.0 % 32.8  31.5  28.5   Platelets 150 - 400 K/uL 142  136  173        Latest Ref Rng & Units 09/05/2021   10:57 AM 07/25/2021   11:19 AM 07/04/2021    9:46 AM  CMP  Glucose 70 - 99 mg/dL 82  83  131   BUN 8 - 23 mg/dL 13  21  13    Creatinine 0.44 - 1.00 mg/dL 1.22  1.41  1.15   Sodium 135 - 145 mmol/L 143  143  143   Potassium 3.5 - 5.1 mmol/L 3.4  3.4  3.4   Chloride 98 - 111 mmol/L 107  105  107   CO2 22 - 32 mmol/L 30  30  30    Calcium 8.9 - 10.3 mg/dL 9.7  9.0  9.2   Total Protein 6.5 - 8.1 g/dL 6.9  6.7  6.9   Total Bilirubin 0.3 - 1.2 mg/dL 0.5  0.4  0.3   Alkaline Phos 38 - 126 U/L 44  47  39   AST 15 - 41 U/L 20  22  21    ALT 0 - 44 U/L 11  12  10      RADIOGRAPHIC STUDIES: MR Brain W Wo Contrast  Result Date: 09/12/2021 CLINICAL DATA:  Brain/CNS neoplasm, assess treatment response; metastatic lung cancer EXAM: MRI HEAD WITHOUT AND WITH CONTRAST TECHNIQUE: Multiplanar, multiecho pulse sequences of the brain and surrounding structures were obtained without and with intravenous contrast. CONTRAST:  27mL GADAVIST GADOBUTROL 1 MMOL/ML IV SOLN COMPARISON:  06/12/2021 FINDINGS: Brain: Several enhancing lesions are again identified. There has been no change since the prior study. Minimally increased edema associated with left superior frontal gyrus and right precentral gyrus lesions (series 4, images 42 and 45). No new mass or abnormal enhancement. No mass effect. No acute infarction. Ventricles are stable in size. No extra-axial collection. Vascular: Major vessel flow voids at the skull base are preserved. Skull and upper cervical spine: Normal marrow signal is preserved. Sinuses/Orbits: Paranasal  sinuses are aerated. Orbits are unremarkable. Other: Sella is unremarkable.  Mastoid air cells are clear. IMPRESSION: Minimally increased edema associated with two of the lesions. Otherwise, continued stability of treated metastatic lesions. No new metastatic disease. Electronically Signed   By: Macy Mis M.D.   On: 09/12/2021 08:36     ASSESSMENT & PLAN Elizabeth Mayer 61 y.o. female with medical history significant for metastatic adenocarcinoma of the lung who presents for follow up.    After review the labs, the records, discussion with the patient the findings are most consistent with metastatic adenocarcinoma the lung without any targetable mutations.  The patient has been started on carboplatin pemetrexed pembrolizumab every 3 weeks with Dr. Julien Nordmann, however the patient has expressed a desire to transition to another provider.  As such we were happy to take up her care here.  She already discontinued the carboplatin and is on maintenance pemetrexed/pembrolizumab therapy. We assumed her care with Cycle 6 on 04/04/2020.  # Metastatic Adenocarcinoma of the Lung #Brain Metastasis  --no targetable mutations noted on prior workup. --continue to follow with Dr. Laqueta Due Onc for metastatic spread to the brain.  --monitor CMP, CBC, and TSH with each treatment --can consider docetaxel/ramucirumab at time of progression.  Plan: --CT scan shows stable disease in April 2023. Next scan in July 2023.  --MRI brain from 06/12/2021 shows stable disease. Next due June 2023.  --today was to be Cycle 25 Day 1 of maintenance Pem/Pem, however patient has requested to continue her chemo holiday  --Labs today were reviewed and stable from prior.  --RTC in 3 weeks for Cycle 25 of chemotherapy  #Chemotherapy Induced Anemia --Hgb 10.3, MCV 94.0 today, stable from prior --continue to monitor   # Fatigue: --Uncertain etiology but likely chemotherapy/immunotherapy side effects  --Monitor closely  # Mid  back pain: --no findings of metastasis to spine or structural problems with back on last imaging.    #hypokalemia, resolved: --potassium level is 3.4 --continue to monitor  #Bilateral ankle edema: --gave prescription for compression stockings --+1 pitting edema of LE bilaterally seen on physical exam  #Memory changes/vision changes/dizziness --Memory loss present for the last several weeks, mainly with word recognition and difficulty remembering daily tasks.  --Floaters in lower visual field of left eye, occasional.  --Dizziness is more frequent but shorter in duration.  --Most recent MRI brain from 06/12/2021 was stable. Patient is scheduled for a follow up with Dr. Mickeal Skinner   No orders of the defined types were placed  in this encounter.  All questions were answered. The patient knows to call the clinic with any problems, questions or concerns.  I have spent a total of 30 minutes minutes of face-to-face and non-face-to-face time, preparing to see the patient, obtaining and/or reviewing separately obtained history, performing a medically appropriate examination, counseling and educating the patient, ordering tests, documenting clinical information in the electronic health record and care coordination.   Ledell Peoples, MD Department of Hematology/Oncology [facility] Phone: [phone] Pager: [phone] Email: [email or web address]  09/25/2021 11:01 AM

## 2021-09-28 ENCOUNTER — Encounter: Payer: Self-pay | Admitting: Physician Assistant

## 2021-09-28 ENCOUNTER — Encounter: Payer: Self-pay | Admitting: Hematology and Oncology

## 2021-10-03 ENCOUNTER — Telehealth: Payer: Self-pay | Admitting: *Deleted

## 2021-10-03 NOTE — Telephone Encounter (Signed)
Received vm message from pt asking for call back.  TCT patient and spoke with her.  She states she has developed midto lower abdominal discomfort, radiates to her back. Denies, nausea, vomiting, diarrhea or constipation. Denies dysuria though she states her urine is dark in color. Advised to increase oral fluids to the point of light yellow urine.  Advised she might be a bit dehydrated. Denies fever, chills, foul smelling urine, or blood in urine.  Advised to go to Urgent Care or ED if she develops fever, chills, dysuria, blood in urine,  Advised to try tylenol for discomfort. Advised to call on over the weekend if she doesn't start feeling better. Pt voiced understanding.

## 2021-10-08 ENCOUNTER — Telehealth: Payer: Self-pay | Admitting: *Deleted

## 2021-10-08 NOTE — Telephone Encounter (Signed)
Patient states she has been dizzy and nauseated yesterday and today. Has taken zofran once, but not eaten anything. Instructed to try to drink as much as possible after taking zofran. BP has been WNL. Has CT scheduled tomorrow. Instructed to take zofran ~ 30 minutes prior to drinking contrast. To call us tomorrow and let us know how she is feeling. To go to the ED if feeling worse.

## 2021-10-09 ENCOUNTER — Other Ambulatory Visit: Payer: Self-pay | Admitting: Hematology and Oncology

## 2021-10-09 ENCOUNTER — Ambulatory Visit (HOSPITAL_COMMUNITY): Admission: RE | Admit: 2021-10-09 | Payer: Medicaid Other | Source: Ambulatory Visit

## 2021-10-10 ENCOUNTER — Telehealth: Payer: Self-pay | Admitting: *Deleted

## 2021-10-10 NOTE — Telephone Encounter (Signed)
TCT patient to follow up with her regarding her nausea and vomiting. Spoke with her. She states she is feeling better. She states the Ondansetron is helping. She is able to keep fluids down and some solids. Advised that once she is feeling better, she can call to reschedule her CT scan. She said she would. She is aware of her appts here on 10/17/21. Advised to call if she does not continue to feel better.  She said she would.

## 2021-10-14 ENCOUNTER — Ambulatory Visit (INDEPENDENT_AMBULATORY_CARE_PROVIDER_SITE_OTHER): Payer: Medicaid Other | Admitting: Nurse Practitioner

## 2021-10-14 ENCOUNTER — Encounter: Payer: Self-pay | Admitting: Nurse Practitioner

## 2021-10-14 VITALS — BP 122/78 | HR 84 | Temp 97.9°F | Ht 66.0 in | Wt 149.2 lb

## 2021-10-14 DIAGNOSIS — I700 Atherosclerosis of aorta: Secondary | ICD-10-CM

## 2021-10-14 DIAGNOSIS — I10 Essential (primary) hypertension: Secondary | ICD-10-CM | POA: Diagnosis not present

## 2021-10-14 DIAGNOSIS — C3491 Malignant neoplasm of unspecified part of right bronchus or lung: Secondary | ICD-10-CM | POA: Diagnosis not present

## 2021-10-14 DIAGNOSIS — Z9981 Dependence on supplemental oxygen: Secondary | ICD-10-CM

## 2021-10-14 MED ORDER — AMLODIPINE BESYLATE 5 MG PO TABS: 5.0000 mg | ORAL_TABLET | Freq: Every day | ORAL | 1 refills | Status: DC

## 2021-10-14 MED ORDER — ATORVASTATIN CALCIUM 10 MG PO TABS: ORAL_TABLET | ORAL | 1 refills | Status: DC

## 2021-10-14 NOTE — Patient Instructions (Signed)
Hypertension, Adult ?Hypertension is another name for high blood pressure. High blood pressure forces your heart to work harder to pump blood. This can cause problems over time. ?There are two numbers in a blood pressure reading. There is a top number (systolic) over a bottom number (diastolic). It is best to have a blood pressure that is below 120/80. ?What are the causes? ?The cause of this condition is not known. Some other conditions can lead to high blood pressure. ?What increases the risk? ?Some lifestyle factors can make you more likely to develop high blood pressure: ?Smoking. ?Not getting enough exercise or physical activity. ?Being overweight. ?Having too much fat, sugar, calories, or salt (sodium) in your diet. ?Drinking too much alcohol. ?Other risk factors include: ?Having any of these conditions: ?Heart disease. ?Diabetes. ?High cholesterol. ?Kidney disease. ?Obstructive sleep apnea. ?Having a family history of high blood pressure and high cholesterol. ?Age. The risk increases with age. ?Stress. ?What are the signs or symptoms? ?High blood pressure may not cause symptoms. Very high blood pressure (hypertensive crisis) may cause: ?Headache. ?Fast or uneven heartbeats (palpitations). ?Shortness of breath. ?Nosebleed. ?Vomiting or feeling like you may vomit (nauseous). ?Changes in how you see. ?Very bad chest pain. ?Feeling dizzy. ?Seizures. ?How is this treated? ?This condition is treated by making healthy lifestyle changes, such as: ?Eating healthy foods. ?Exercising more. ?Drinking less alcohol. ?Your doctor may prescribe medicine if lifestyle changes do not help enough and if: ?Your top number is above 130. ?Your bottom number is above 80. ?Your personal target blood pressure may vary. ?Follow these instructions at home: ?Eating and drinking ? ?If told, follow the DASH eating plan. To follow this plan: ?Fill one half of your plate at each meal with fruits and vegetables. ?Fill one fourth of your plate  at each meal with whole grains. Whole grains include whole-wheat pasta, brown rice, and whole-grain bread. ?Eat or drink low-fat dairy products, such as skim milk or low-fat yogurt. ?Fill one fourth of your plate at each meal with low-fat (lean) proteins. Low-fat proteins include fish, chicken without skin, eggs, beans, and tofu. ?Avoid fatty meat, cured and processed meat, or chicken with skin. ?Avoid pre-made or processed food. ?Limit the amount of salt in your diet to less than 1,500 mg each day. ?Do not drink alcohol if: ?Your doctor tells you not to drink. ?You are pregnant, may be pregnant, or are planning to become pregnant. ?If you drink alcohol: ?Limit how much you have to: ?0-1 drink a day for women. ?0-2 drinks a day for men. ?Know how much alcohol is in your drink. In the [place] one drink equals one 12 oz bottle of beer (355 mL), one 5 oz glass of wine (148 mL), or one 1? oz glass of hard liquor (44 mL). ?Lifestyle ? ?Work with your doctor to stay at a healthy weight or to lose weight. Ask your doctor what the best weight is for you. ?Get at least 30 minutes of exercise that causes your heart to beat faster (aerobic exercise) most days of the week. This may include walking, swimming, or biking. ?Get at least 30 minutes of exercise that strengthens your muscles (resistance exercise) at least 3 days a week. This may include lifting weights or doing Pilates. ?Do not smoke or use any products that contain nicotine or tobacco. If you need help quitting, ask your doctor. ?Check your blood pressure at home as told by your doctor. ?Keep all follow-up visits. ?Medicines ?Take over-the-counter and prescription medicines   only as told by your doctor. Follow directions carefully. ?Do not skip doses of blood pressure medicine. The medicine does not work as well if you skip doses. Skipping doses also puts you at risk for problems. ?Ask your doctor about side effects or reactions to medicines that you should watch  for. ?Contact a doctor if: ?You think you are having a reaction to the medicine you are taking. ?You have headaches that keep coming back. ?You feel dizzy. ?You have swelling in your ankles. ?You have trouble with your vision. ?Get help right away if: ?You get a very bad headache. ?You start to feel mixed up (confused). ?You feel weak or numb. ?You feel faint. ?You have very bad pain in your: ?Chest. ?Belly (abdomen). ?You vomit more than once. ?You have trouble breathing. ?These symptoms may be an emergency. Get help right away. Call 911. ?Do not wait to see if the symptoms will go away. ?Do not drive yourself to the hospital. ?Summary ?Hypertension is another name for high blood pressure. ?High blood pressure forces your heart to work harder to pump blood. ?For most people, a normal blood pressure is less than 120/80. ?Making healthy choices can help lower blood pressure. If your blood pressure does not get lower with healthy choices, you may need to take medicine. ?This information is not intended to replace advice given to you by your health care provider. Make sure you discuss any questions you have with your health care provider. ?Document Revised: 01/09/2021 Document Reviewed: 01/09/2021 ?Elsevier Patient Education ? 2023 [employer] ? ?

## 2021-10-14 NOTE — Progress Notes (Signed)
I,Victoria T Hamilton,acting as a Education administrator for Minette Brine, FNP.,have documented all relevant documentation on the behalf of Minette Brine, FNP,as directed by  Minette Brine, FNP while in the presence of Minette Brine, [place].  Subjective:     Patient ID: Elizabeth Mayer , female    DOB: 05/21/1960 , 61 y.o.   MRN: [id number]   Chief Complaint  Patient presents with   Hypertension    HPI  Pt presents for BP f/u.   Continues to have allergic reactions to the chemo, had the Thursday before her birthday and got progressively worse and on 4th of July the room began spinning and vomiting what she ate. She took some antiemetic pills which was unable to hold down. Since Friday and Saturday began eating again and now re-hydrating. She is due to see Oncology Thursday and is scheduled to have another CT scan will have to be able to hold down the contrast. Oncology is waiting for the scan to see if they can take her off of it.  She has talked with Oncology about the fact the mass is smaller but he feels she will be on Kertruda for the rest of her life but she does not want this. She states "they are draining her bank account and life. And she is miserable." She states she had 22 brain tumors that have healed".  She reports her A fib being a lot better. She is getting chemo every 3 weeks and by the time she feels better it is time for another round. She is allergic to the chemo.   She will have atrial fibrillation every 2 weeks and will take cardizem as needed.  Notified patient HM.   Hypertension This is a chronic problem. The current episode started more than 1 year ago. The problem is controlled. There are no associated agents to hypertension. Risk factors for coronary artery disease include sedentary lifestyle. Past treatments include calcium channel blockers.     Past Medical History:  Diagnosis Date   Anemia    Angina 1982   related to stress   Asthma    in the past,  no current problems   Atrial  fibrillation ([place])    Complication of anesthesia    states anesthesia made her hair fall out   Constipation    Dyspnea    on oxygen at home - 3L via [place]   Fatigue    GERD (gastroesophageal reflux disease)    patient denies this dx   Headache    Heart murmur 1970s   no problems currently   Hypertension    Ingrown toenail    Lung cancer ([place]) 10/2019   metastatic disease to the brain   On home oxygen therapy    2L via [place] - 24 hours a day   Past heart attack 1980-1981   pt states she passed out and woke up in hospital- told she had heart attack, but then dr said he couldn't find anything wrong.   Pneumonia    x 1     Family History  Problem Relation Age of Onset   Arthritis Mother    Heart failure Father      Current Outpatient Medications:    acetaminophen (TYLENOL) 500 MG tablet, Take 1,000 mg by mouth 2 (two) times daily as needed (pain.)., Disp: , Rfl:    atorvastatin (LIPITOR) 10 MG tablet, Take at bedtime once a day MWF, Disp: 45 tablet, Rfl: 1   benzoyl peroxide (CERAVE ACNE FOAMING CREAM)  4 % external liquid, Apply 1 application topically 2 (two) times daily. Face, Disp: , Rfl:    Blood Pressure Monitoring (BLOOD PRESSURE MONITOR AUTOMAT) DEVI, Check Blood pressure at home daily, Disp: 1 each, Rfl: 0   Carboxymethylcellulose Sodium (REFRESH CELLUVISC OP), Place 2 drops into both eyes in the morning and at bedtime., Disp: , Rfl:    cetaphil (CETAPHIL) lotion, Apply 1 application. topically as needed for dry skin., Disp: , Rfl:    clindamycin (CLEOCIN T) 1 % external solution, Apply 1 application topically 2 (two) times daily., Disp: , Rfl:    cyclobenzaprine (FLEXERIL) 10 MG tablet, Take 1 tablet (10 mg total) by mouth 3 (three) times daily as needed for muscle spasms., Disp: 30 tablet, Rfl: 0   diltiazem (CARDIZEM) 30 MG tablet, Take 1 tablet (30 mg total) by mouth 4 (four) times daily. (Patient taking differently: Take 30 mg by mouth 4 (four) times daily as needed (afib  episodes).), Disp: 120 tablet, Rfl: 1   fexofenadine (ALLEGRA) 180 MG tablet, Take 180 mg by mouth in the morning., Disp: , Rfl:    fluticasone (FLONASE) 50 MCG/ACT nasal spray, Place 1 spray into both nostrils in the morning., Disp: , Rfl:    folic acid (FOLVITE) 1 MG tablet, Take 1 tablet (1 mg total) by mouth daily., Disp: 90 tablet, Rfl: 1   guaiFENesin (MUCINEX) 600 MG 12 hr tablet, Take 600 mg by mouth 2 (two) times daily. , Disp: , Rfl:    HYDROcodone-acetaminophen (NORCO/VICODIN) 5-325 MG tablet, Take 1 tablet by mouth every 6 (six) hours as needed for moderate pain., Disp: 30 tablet, Rfl: 0   lidocaine-prilocaine (EMLA) cream, Apply 1 application topically as needed., Disp: 30 g, Rfl: 2   Multiple Vitamin (MULTIVITAMIN WITH MINERALS) TABS tablet, Take 1 tablet by mouth daily. Alive, Disp: , Rfl:    ondansetron (ZOFRAN) 8 MG tablet, TAKE 1 TABLET BY MOUTH EVERY 8 HOURS AS NEEDED FOR NAUSEA OR VOMITING, Disp: 20 tablet, Rfl: 0   OXYGEN, Inhale 3 L/min into the lungs See admin instructions. At bedtime & as needed during the day, Disp: , Rfl:    Pembrolizumab (KEYTRUDA IV), Inject into the vein. Every 3 weeks, Disp: , Rfl:    Polyethyl Glycol-Propyl Glycol (LUBRICANT EYE DROPS) 0.4-0.3 % SOLN, Apply 1-2 drops to eye 3 (three) times daily as needed (dry/irritated eyes)., Disp: , Rfl:    Soap & Cleansers (MEDERMA AG FACIAL TONER) LIQD, Apply 1 application topically 2 (two) times daily. Advance dry skin, Disp: , Rfl:    triamcinolone ointment (KENALOG) 0.1 %, Apply 1 application topically in the morning and at bedtime., Disp: , Rfl:    VENTOLIN HFA 108 (90 Base) MCG/ACT inhaler, INHALE 1 TO 2 PUFFS BY MOUTH EVERY 6 HOURS AS NEEDED FOR WHEEZING OR SHORTNESS OF BREATH, Disp: 18 g, Rfl: 3   amLODipine (NORVASC) 5 MG tablet, Take 1 tablet (5 mg total) by mouth daily., Disp: 90 tablet, Rfl: 1   SYMBICORT 80-4.5 MCG/ACT inhaler, INHALE 2 PUFFS BY MOUTH IN THE MORNING AND AT BEDTIME, Disp: 11 g, Rfl: 0    Allergies  Allergen Reactions   Sudafed [Pseudoephedrine] Hypertension   Gabapentin Other (See Comments)    Raise blood pressure and red rings around eyes Blood vessels popped in her eyes   Mobic [Meloxicam] Swelling    Inflamed the area that has inflammation and stabbing pains in the area   Penicillins Hives     Childhood reaction   Prednisone  Caused A-fib     Review of Systems  Constitutional: Negative.   Respiratory: Negative.    Cardiovascular: Negative.   Neurological: Negative.   Psychiatric/Behavioral: Negative.       Today's Vitals   10/14/21 1632  BP: 122/78  Pulse: 84  Temp: 97.9 F (36.6 C)  SpO2: 99%  Weight: 149 lb 3.2 oz (67.7 kg)  Height: 5\' 6"  (1.676 m)   Body mass index is 24.08 kg/m.  Wt Readings from Last 3 Encounters:  10/17/21 145 lb 3.2 oz (65.9 kg)  10/14/21 149 lb 3.2 oz (67.7 kg)  09/25/21 148 lb 14.4 oz (67.5 kg)    Objective:  Physical Exam Vitals reviewed.  Constitutional:      General: She is not in acute distress.    Appearance: Normal appearance.  Cardiovascular:     Rate and Rhythm: Normal rate and regular rhythm.     Pulses: Normal pulses.     Heart sounds: Normal heart sounds. No murmur heard. Pulmonary:     Effort: Pulmonary effort is normal. No respiratory distress.     Breath sounds: Normal breath sounds. No wheezing.     Comments: Wearing oxygen via [place] Neurological:     General: No focal deficit present.     Mental Status: She is alert and oriented to person, place, and time.     Cranial Nerves: No cranial nerve deficit.     Motor: No weakness.  Psychiatric:        Mood and Affect: Mood normal.        Behavior: Behavior normal.        Thought Content: Thought content normal.        Judgment: Judgment normal.         Assessment And Plan:     1. Essential hypertension Comments: Blood pressure is well controlled, continue current medications - amLODipine (NORVASC) 5 MG tablet; Take 1 tablet (5 mg total)  by mouth daily.  Dispense: 90 tablet; Refill: 1  2. Non-small cell lung cancer, right [facility]) Comments: Long conversation about goals of care she would like a second opinion. She wants to live the best life she can. Explained according to her note from [facility] her cancer is extensive - Ambulatory referral to Hematology / Oncology  3. Dependence on continuous supplemental oxygen - Ambulatory referral to Hematology / Oncology  4. Aortic atherosclerosis (HCC) Comments: continune statin, tolerating well - atorvastatin (LIPITOR) 10 MG tablet; Take at bedtime once a day MWF  Dispense: 45 tablet; Refill: 1     Patient was given opportunity to ask questions. Patient verbalized understanding of the plan and was able to repeat key elements of the plan. All questions were answered to their satisfaction.  Minette Brine, FNP   I, Minette Brine, FNP, have reviewed all documentation for this visit. The documentation on 10/14/21 for the exam, diagnosis, procedures, and orders are all accurate and complete.   I personally spent 25 minutes face-to-face and non-face-to-face in the care of this patient, which includes all pre-, intra-, and post visit time on the date of service.  IF YOU HAVE BEEN REFERRED TO A SPECIALIST, IT MAY TAKE 1-2 WEEKS TO SCHEDULE/PROCESS THE REFERRAL. IF YOU HAVE NOT HEARD FROM US/SPECIALIST IN TWO WEEKS, PLEASE GIVE [place] A CALL AT [phone].   THE PATIENT IS ENCOURAGED TO PRACTICE SOCIAL DISTANCING DUE TO THE COVID-19 PANDEMIC.

## 2021-10-17 ENCOUNTER — Inpatient Hospital Stay: Payer: Medicaid Other

## 2021-10-17 ENCOUNTER — Other Ambulatory Visit: Payer: Self-pay

## 2021-10-17 ENCOUNTER — Inpatient Hospital Stay (HOSPITAL_BASED_OUTPATIENT_CLINIC_OR_DEPARTMENT_OTHER): Payer: Medicaid Other | Admitting: Hematology and Oncology

## 2021-10-17 ENCOUNTER — Inpatient Hospital Stay: Payer: Medicaid Other | Attending: Internal Medicine

## 2021-10-17 VITALS — BP 136/71 | HR 86 | Temp 98.1°F | Resp 16 | Wt 145.2 lb

## 2021-10-17 DIAGNOSIS — C7931 Secondary malignant neoplasm of brain: Secondary | ICD-10-CM

## 2021-10-17 DIAGNOSIS — R5383 Other fatigue: Secondary | ICD-10-CM | POA: Insufficient documentation

## 2021-10-17 DIAGNOSIS — C3491 Malignant neoplasm of unspecified part of right bronchus or lung: Secondary | ICD-10-CM

## 2021-10-17 DIAGNOSIS — Z5111 Encounter for antineoplastic chemotherapy: Secondary | ICD-10-CM

## 2021-10-17 DIAGNOSIS — Z79899 Other long term (current) drug therapy: Secondary | ICD-10-CM | POA: Insufficient documentation

## 2021-10-17 DIAGNOSIS — D6481 Anemia due to antineoplastic chemotherapy: Secondary | ICD-10-CM | POA: Insufficient documentation

## 2021-10-17 DIAGNOSIS — Z87891 Personal history of nicotine dependence: Secondary | ICD-10-CM | POA: Insufficient documentation

## 2021-10-17 DIAGNOSIS — Z5112 Encounter for antineoplastic immunotherapy: Secondary | ICD-10-CM

## 2021-10-17 DIAGNOSIS — C779 Secondary and unspecified malignant neoplasm of lymph node, unspecified: Secondary | ICD-10-CM | POA: Insufficient documentation

## 2021-10-17 DIAGNOSIS — Z95828 Presence of other vascular implants and grafts: Secondary | ICD-10-CM | POA: Diagnosis not present

## 2021-10-17 DIAGNOSIS — I10 Essential (primary) hypertension: Secondary | ICD-10-CM | POA: Insufficient documentation

## 2021-10-17 LAB — CMP (CANCER CENTER ONLY)
ALT: 8 U/L (ref 0–44)
AST: 16 U/L (ref 15–41)
Albumin: 3.8 g/dL (ref 3.5–5.0)
Alkaline Phosphatase: 44 U/L (ref 38–126)
Anion gap: 5 (ref 5–15)
BUN: 16 mg/dL (ref 8–23)
CO2: 31 mmol/L (ref 22–32)
Calcium: 9.4 mg/dL (ref 8.9–10.3)
Chloride: 105 mmol/L (ref 98–111)
Creatinine: 1.23 mg/dL — ABNORMAL HIGH (ref 0.44–1.00)
GFR, Estimated: 49 mL/min — ABNORMAL LOW (ref >60)
Glucose, Bld: 119 mg/dL — ABNORMAL HIGH (ref 70–99)
Potassium: 3.9 mmol/L (ref 3.5–5.1)
Sodium: 141 mmol/L (ref 135–145)
Total Bilirubin: 0.3 mg/dL (ref 0.3–1.2)
Total Protein: 6.7 g/dL (ref 6.5–8.1)

## 2021-10-17 LAB — CBC WITH DIFFERENTIAL (CANCER CENTER ONLY)
Abs Immature Granulocytes: 0.01 10*3/uL (ref 0.00–0.07)
Basophils Absolute: 0 10*3/uL (ref 0.0–0.1)
Basophils Relative: 1 %
Eosinophils Absolute: 0.2 10*3/uL (ref 0.0–0.5)
Eosinophils Relative: 4 %
HCT: 33.9 % — ABNORMAL LOW (ref 36.0–46.0)
Hemoglobin: 11.4 g/dL — ABNORMAL LOW (ref 12.0–15.0)
Immature Granulocytes: 0 %
Lymphocytes Relative: 24 %
Lymphs Abs: 1.2 10*3/uL (ref 0.7–4.0)
MCH: 30.6 pg (ref 26.0–34.0)
MCHC: 33.6 g/dL (ref 30.0–36.0)
MCV: 90.9 fL (ref 80.0–100.0)
Monocytes Absolute: 0.3 10*3/uL (ref 0.1–1.0)
Monocytes Relative: 7 %
Neutro Abs: 3.2 10*3/uL (ref 1.7–7.7)
Neutrophils Relative %: 64 %
Platelet Count: 140 10*3/uL — ABNORMAL LOW (ref 150–400)
RBC: 3.73 MIL/uL — ABNORMAL LOW (ref 3.87–5.11)
RDW: 12.6 % (ref 11.5–15.5)
WBC Count: 5 10*3/uL (ref 4.0–10.5)
nRBC: 0 % (ref 0.0–0.2)

## 2021-10-17 LAB — TSH: TSH: 1.103 u[IU]/mL (ref 0.350–4.500)

## 2021-10-17 MED ORDER — SODIUM CHLORIDE 0.9% FLUSH
10.0000 mL | Freq: Once | INTRAVENOUS | Status: AC
  Administered 2021-10-17: 10 mL via INTRAVENOUS

## 2021-10-17 MED ORDER — SODIUM CHLORIDE 0.9% FLUSH
10.0000 mL | Freq: Once | INTRAVENOUS | Status: AC
  Administered 2021-10-17: 10 mL

## 2021-10-17 MED ORDER — HEPARIN SOD (PORK) LOCK FLUSH 100 UNIT/ML IV SOLN
500.0000 [IU] | Freq: Once | INTRAVENOUS | Status: AC
  Administered 2021-10-17: 500 [IU] via INTRAVENOUS

## 2021-10-17 NOTE — Progress Notes (Signed)
[place] [phone]   [phone]  PROGRESS NOTE  Patient Care Team: Minette Brine, FNP as PCP - General (General Practice)  Hematological/Oncological History # Metastatic Adenocarcinoma of the Lung #Brain Metastasis 1) 11/08/2019: establish care with Dr. Julien Nordmann and his PA Cassie Heilingoetter. Noted to have widely metastatic lung cancer with involvement of the brain, lymph nodes, and right lung.  2) 12/04/2019: palliative radiation to the right lung mass/cervical adenopathy 3) 9/7/02021: started therapy with Carbo/Pem/Pem 4) 9/29-10/09/2019: patient underwent SRS to the brain mets 5) 03/22/2020: repeat CT C/A/P and neck scheduled. Transfer care to Dr. Lorenso Courier.   6) 03/22/2020: CT C/A/P showed interval significant improvement in previously demonstrated extensive confluent lymphadenopathy in the superior mediastinum and right hilar regions. 7) 08/15/2020: Holding chemotherapy due to poor Hgb and fatigue. Allowing patient a brief 'chemo holiday' to rebound appropriately from last cycle.  8) 08/29/2020: restarted chemotherapy, Cycle 12 Day 1 9) 09/19/2020: patient requested to HOLD Cycle 13 in setting of fatigue/weakness.  10) 11/29/2020: Cycle 14 Day 1 of chemotherapy, delayed start due to insurance issues.   Interval History:  Elizabeth Mayer 61 y.o. female with medical history significant for metastatic adenocarcinoma of the lung who presents for follow up. The patient's last visit was on 09/25/2021.  In the interim since her last visit she has restarted pembrolizumab alone.   On exam today [name] reports she continues to have swelling of her feet and ankles.  She reports that her ankle pain has been steady.  She notes that the swelling has been fluctuant.  She notes that she continues to feel like something is "off".  She does have some occasional lightheadedness and dizziness.  Overall she is stable compared to where she was during her previous visit.   She denies any nausea, vomiting, or diarrhea.  She denies fevers, chills, night sweats, chest pain or cough. She has no other complaints.  A full 10 point ROS is listed below.  Today [name] noted that she would like to have a second opinion.  She reports that she had a bad experience with her initial oncologist (in our practice) and was disturbed to learn that he was my "boss".  I noted that the prior oncologist is not my direct supervisor and is only a Social worker.  She also noted that she was unaware that she was receiving chemotherapy (?)  in combination with her immunotherapy.  She notes that she would like to discuss her care with another provider outside the [facility] health system.  She would like a second opinion at [facility].  In the interim she would like to hold all further interventions.  This conversation was very unusual and atypical for my interactions with Ms. Wassel.  Review of scans show that she had stable brain metastasis on 09/11/2021.  She is otherwise alert and oriented with no focal neurological deficits.   MEDICAL HISTORY:  Past Medical History:  Diagnosis Date   Anemia    Angina 1982   related to stress   Asthma    in the past,  no current problems   Atrial fibrillation (HCC)    Complication of anesthesia    states anesthesia made her hair fall out   Constipation    Dyspnea    on oxygen at home - 3L via [place]   Fatigue    GERD (gastroesophageal reflux disease)    patient denies this dx   Headache    Heart murmur 1970s  no problems currently   Hypertension    Ingrown toenail    Lung cancer ([place]) 10/2019   metastatic disease to the brain   On home oxygen therapy    2L via [place] - 24 hours a day   Past heart attack 1980-1981   pt states she passed out and woke up in hospital- told she had heart attack, but then dr said he couldn't find anything wrong.   Pneumonia    x 1    SURGICAL HISTORY: Past Surgical History:  Procedure Laterality Date   CERVICAL  DISC SURGERY  2000   Disc removed from neck    ingrown toe nail surgery Bilateral    IR IMAGING GUIDED PORT INSERTION  02/16/2020   MULTIPLE TOOTH EXTRACTIONS     for braces   RADIOLOGY WITH ANESTHESIA N/A 12/05/2019   Procedure: MRI BRAIN WITH AND WITHOUT CONTRAST;  Surgeon: Radiologist, Medication, MD;  Location: [place];  Service: Radiology;  Laterality: N/A;   RADIOLOGY WITH ANESTHESIA N/A 01/02/2020   Procedure: MRI BRAIN WITH AND WITHOUT CONTRAST;  Surgeon: Radiologist, Medication, MD;  Location: [place];  Service: Radiology;  Laterality: N/A;   RADIOLOGY WITH ANESTHESIA N/A 02/20/2020   Procedure: MRI WITH ANESTHESIA OF BRAIN WITH AND WITHOUT CONTRAST;  Surgeon: Radiologist, Medication, MD;  Location: [place];  Service: Radiology;  Laterality: N/A;   RADIOLOGY WITH ANESTHESIA N/A 06/11/2020   Procedure: MRI WITH ANESTHESIA OF BRAIN WITH AND WITHOUT CONTRAST;  Surgeon: Radiologist, Medication, MD;  Location: [place];  Service: Radiology;  Laterality: N/A;   RADIOLOGY WITH ANESTHESIA N/A 10/10/2020   Procedure: MRI WITH ANESTHESIA BRAIN WITH AND WITHOUT CONTRAST;  Surgeon: Radiologist, Medication, MD;  Location: [place];  Service: Radiology;  Laterality: N/A;   RADIOLOGY WITH ANESTHESIA N/A 01/30/2021   Procedure: MRI BRAIN WITH AND WITHOUT CONTRASTWITH ANESTHESIA;  Surgeon: Radiologist, Medication, MD;  Location: [place];  Service: Radiology;  Laterality: N/A;   RADIOLOGY WITH ANESTHESIA N/A 06/12/2021   Procedure: MRI WITH ANESTHESIA OF BRAIN WITH AND WITHOUT CONTRAST;  Surgeon: Radiologist, Medication, MD;  Location: [place];  Service: Radiology;  Laterality: N/A;   RADIOLOGY WITH ANESTHESIA N/A 09/11/2021   Procedure: MRI OF BRAIN WITH AND WITHOUT CONTRAST WITH ANESTHESIA;  Surgeon: Radiologist, Medication, MD;  Location: [place];  Service: Radiology;  Laterality: N/A;   TONSILLECTOMY      SOCIAL HISTORY: Social History   Socioeconomic History   Marital status: Legally Separated    Spouse name: Not on  file   Number of children: 0   Years of education: Not on file   Highest education level: Not on file  Occupational History   Occupation: Secondary school teacher  Tobacco Use   Smoking status: Former    Packs/day: 1.00    Years: 20.00    Total pack years: 20.00    Types: Cigarettes    Start date: 39    Quit date: 2001    Years since quitting: 22.5   Smokeless tobacco: Never  Vaping Use   Vaping Use: Never used  Substance and Sexual Activity   Alcohol use: No   Drug use: No   Sexual activity: Not Currently    Birth control/protection: None, Post-menopausal  Other Topics Concern   Not on file  Social History Narrative   Not on file   Social Determinants of Health   Financial Resource Strain: High Risk (11/10/2019)   Overall Financial Resource Strain (CARDIA)    Difficulty of Paying Living Expenses: Hard  Food Insecurity: Food Insecurity Present (09/16/2021)   Hunger Vital Sign    Worried About Running Out of Food in the Last Year: Never true    Ran Out of Food in the Last Year: Sometimes true  Transportation Needs: No Transportation Needs (09/16/2021)   PRAPARE - Hydrologist (Medical): No    Lack of Transportation (Non-Medical): No  Physical Activity: Inactive (11/10/2019)   Exercise Vital Sign    Days of Exercise per Week: 0 days    Minutes of Exercise per Session: 0 min  Stress: Not on file  Social Connections: Moderately Isolated (11/10/2019)   Social Connection and Isolation Panel [NHANES]    Frequency of Communication with Friends and Family: Twice a week    Frequency of Social Gatherings with Friends and Family: Twice a week    Attends Religious Services: More than 4 times per year    Active Member of [employer] or Organizations: No    Attends Music therapist: Not on file    Marital Status: Separated  Intimate Partner Violence: Not on file    FAMILY HISTORY: Family History  Problem Relation Age of Onset   Arthritis Mother     Heart failure Father     ALLERGIES:  is allergic to sudafed [pseudoephedrine], gabapentin, mobic [meloxicam], penicillins, and prednisone.  MEDICATIONS:  Current Outpatient Medications  Medication Sig Dispense Refill   acetaminophen (TYLENOL) 500 MG tablet Take 1,000 mg by mouth 2 (two) times daily as needed (pain.).     amLODipine (NORVASC) 5 MG tablet Take 1 tablet (5 mg total) by mouth daily. 90 tablet 1   atorvastatin (LIPITOR) 10 MG tablet Take at bedtime once a day MWF 45 tablet 1   benzoyl peroxide (CERAVE ACNE FOAMING CREAM) 4 % external liquid Apply 1 application topically 2 (two) times daily. Face     Blood Pressure Monitoring (BLOOD PRESSURE MONITOR AUTOMAT) DEVI Check Blood pressure at home daily 1 each 0   Carboxymethylcellulose Sodium (REFRESH CELLUVISC OP) Place 2 drops into both eyes in the morning and at bedtime.     cetaphil (CETAPHIL) lotion Apply 1 application. topically as needed for dry skin.     clindamycin (CLEOCIN T) 1 % external solution Apply 1 application topically 2 (two) times daily.     cyclobenzaprine (FLEXERIL) 10 MG tablet Take 1 tablet (10 mg total) by mouth 3 (three) times daily as needed for muscle spasms. 30 tablet 0   diltiazem (CARDIZEM) 30 MG tablet Take 1 tablet (30 mg total) by mouth 4 (four) times daily. (Patient taking differently: Take 30 mg by mouth 4 (four) times daily as needed (afib episodes).) 120 tablet 1   fexofenadine (ALLEGRA) 180 MG tablet Take 180 mg by mouth in the morning.     fluticasone (FLONASE) 50 MCG/ACT nasal spray Place 1 spray into both nostrils in the morning.     folic acid (FOLVITE) 1 MG tablet Take 1 tablet (1 mg total) by mouth daily. 90 tablet 1   guaiFENesin (MUCINEX) 600 MG 12 hr tablet Take 600 mg by mouth 2 (two) times daily.      HYDROcodone-acetaminophen (NORCO/VICODIN) 5-325 MG tablet Take 1 tablet by mouth every 6 (six) hours as needed for moderate pain. 30 tablet 0   lidocaine-prilocaine (EMLA) cream Apply 1  application topically as needed. 30 g 2   Multiple Vitamin (MULTIVITAMIN WITH MINERALS) TABS tablet Take 1 tablet by mouth daily. Alive     ondansetron (ZOFRAN) 8  MG tablet TAKE 1 TABLET BY MOUTH EVERY 8 HOURS AS NEEDED FOR NAUSEA OR VOMITING 20 tablet 0   OXYGEN Inhale 3 L/min into the lungs See admin instructions. At bedtime & as needed during the day     Pembrolizumab (KEYTRUDA IV) Inject into the vein. Every 3 weeks     Polyethyl Glycol-Propyl Glycol (LUBRICANT EYE DROPS) 0.4-0.3 % SOLN Apply 1-2 drops to eye 3 (three) times daily as needed (dry/irritated eyes).     Soap & Cleansers (MEDERMA AG FACIAL TONER) LIQD Apply 1 application topically 2 (two) times daily. Advance dry skin     SYMBICORT 80-4.5 MCG/ACT inhaler INHALE 2 PUFFS BY MOUTH IN THE MORNING AND AT BEDTIME 11 g 5   triamcinolone ointment (KENALOG) 0.1 % Apply 1 application topically in the morning and at bedtime.     VENTOLIN HFA 108 (90 Base) MCG/ACT inhaler INHALE 1 TO 2 PUFFS BY MOUTH EVERY 6 HOURS AS NEEDED FOR WHEEZING OR SHORTNESS OF BREATH 18 g 3   No current facility-administered medications for this visit.    REVIEW OF SYSTEMS:   Constitutional: ( - ) fevers, ( - )  chills , ( - ) night sweats Eyes: ( - ) blurriness of vision, ( - ) double vision, ( - ) watery eyes Ears, nose, mouth, throat, and face: ( - ) mucositis, ( - ) sore throat Respiratory: ( - ) cough, ( - ) dyspnea, ( - ) wheezes Cardiovascular: ( - ) palpitation, ( - ) chest discomfort, ( +) lower extremity swelling Gastrointestinal:  ( - ) nausea, ( - ) heartburn, ( - ) change in bowel habits Skin: ( - ) abnormal skin rashes Lymphatics: ( - ) new lymphadenopathy, ( - ) easy bruising Neurological: ( - ) numbness, ( - ) tingling, ( - ) new weaknesses Behavioral/Psych: ( - ) mood change, ( - ) new changes  All other systems were reviewed with the patient and are negative.  PHYSICAL EXAMINATION: ECOG PERFORMANCE STATUS: 2 - Symptomatic, <50% confined to  bed  Vitals:   10/17/21 1026  BP: 136/71  Pulse: 86  Resp: 16  Temp: 98.1 F (36.7 C)  SpO2: 100%     Filed Weights   10/17/21 1026  Weight: 145 lb 3.2 oz (65.9 kg)   GENERAL: well appearing middle aged [place] American female. alert, no distress and comfortable SKIN: skin color, texture, turgor are normal, no rashes or significant lesions EYES: conjunctiva are pink and non-injected, sclera clear LUNGS: clear to auscultation and percussion with normal breathing effort HEART: regular rate & rhythm and no murmurs and +1 pitting bilateral lower extremity edema Musculoskeletal: no cyanosis of digits and no clubbing.  PSYCH: alert & oriented x 3, fluent speech NEURO: no focal motor/sensory deficits   LABORATORY DATA:  I have reviewed the data as listed    Latest Ref Rng & Units 10/17/2021   10:05 AM 09/25/2021   10:17 AM 09/05/2021   10:57 AM  CBC  WBC 4.0 - 10.5 K/uL 5.0  4.2  4.0   Hemoglobin 12.0 - 15.0 g/dL 11.4  10.9  10.3   Hematocrit 36.0 - 46.0 % 33.9  32.8  31.5   Platelets 150 - 400 K/uL 140  142  136        Latest Ref Rng & Units 09/25/2021   10:17 AM 09/05/2021   10:57 AM 07/25/2021   11:19 AM  CMP  Glucose 70 - 99 mg/dL 133  82  83  BUN 8 - 23 mg/dL 15  13  21    Creatinine 0.44 - 1.00 mg/dL 1.18  1.22  1.41   Sodium 135 - 145 mmol/L 142  143  143   Potassium 3.5 - 5.1 mmol/L 3.5  3.4  3.4   Chloride 98 - 111 mmol/L 107  107  105   CO2 22 - 32 mmol/L 30  30  30    Calcium 8.9 - 10.3 mg/dL 9.5  9.7  9.0   Total Protein 6.5 - 8.1 g/dL 6.8  6.9  6.7   Total Bilirubin 0.3 - 1.2 mg/dL 0.3  0.5  0.4   Alkaline Phos 38 - 126 U/L 41  44  47   AST 15 - 41 U/L 15  20  22    ALT 0 - 44 U/L 8  11  12      RADIOGRAPHIC STUDIES: No results found.   ASSESSMENT & PLAN Elizabeth Mayer 61 y.o. female with medical history significant for metastatic adenocarcinoma of the lung who presents for follow up.    After review the labs, the records, discussion with the patient  the findings are most consistent with metastatic adenocarcinoma the lung without any targetable mutations.  The patient has been started on carboplatin pemetrexed pembrolizumab every 3 weeks with Dr. Julien Nordmann, however the patient has expressed a desire to transition to another provider.  As such we were happy to take up her care here.  She already discontinued the carboplatin and is on maintenance pemetrexed/pembrolizumab therapy. We assumed her care with Cycle 6 on 04/04/2020.  # Metastatic Adenocarcinoma of the Lung #Brain Metastasis  --no targetable mutations noted on prior workup. --continue to follow with Dr. Laqueta Due Onc for metastatic spread to the brain.  --monitor CMP, CBC, and TSH with each treatment --can consider docetaxel/ramucirumab at time of progression.  Plan: --CT scan shows stable disease in April 2023. Next scan in July 2023. She reports she will call to have this scheduled.  --MRI brain from 09/11/2021 shows stable disease. Next due Sept/Oct 2023.  --patient requested to HOLD treatment at this time and discuss further with a second opinion provider.  --Labs today were reviewed and stable from prior.  --RTC in 3 weeks, will keep chemotherapy scheduled.   # Second Opinion --had a conversation with the patient about her desire for 2nd opinion. Her current therapy is effective but she wishes to delay until she discusses with another provider --had unusual reasoning as noted above. Conversation was odd as we have had good interactions with the patient for almost 2 years.  --she wants a referral outside of the [facility] system, preferably to [facility].  -- will make referral per patient request.   #Chemotherapy Induced Anemia --Hgb 11.4, MCV 90.6 today, stable from prior --continue to monitor   # Fatigue: --Uncertain etiology but likely chemotherapy/immunotherapy side effects  --Monitor closely  # Mid back pain: --no findings of metastasis to spine or structural problems with back  on last imaging.    #hypokalemia, resolved: --potassium level is 3.9 --continue to monitor  #Bilateral ankle edema: --gave prescription for compression stockings --+1 pitting edema of LE bilaterally seen on physical exam  #Memory changes/vision changes/dizziness --Memory loss present for the last several weeks, mainly with word recognition and difficulty remembering daily tasks.  --Floaters in lower visual field of left eye, occasional.  --Dizziness is more frequent but shorter in duration.  --Most recent MRI brain from 09/11/2021 was stable. Patient has continued follow up with Dr. Mickeal Skinner  No orders of the defined types were placed in this encounter.  All questions were answered. The patient knows to call the clinic with any problems, questions or concerns.  I have spent a total of 30 minutes minutes of face-to-face and non-face-to-face time, preparing to see the patient, obtaining and/or reviewing separately obtained history, performing a medically appropriate examination, counseling and educating the patient, ordering tests, documenting clinical information in the electronic health record and care coordination.   Ledell Peoples, MD Department of Hematology/Oncology [place] at [facility] Phone: [phone] Pager: [phone] Email: [email or web address]  10/17/2021 10:29 AM

## 2021-10-18 ENCOUNTER — Ambulatory Visit (HOSPITAL_COMMUNITY)
Admission: RE | Admit: 2021-10-18 | Discharge: 2021-10-18 | Disposition: A | Discharge status: Home / Self Care | Payer: Medicaid Other | Source: Ambulatory Visit | Attending: Hematology and Oncology | Admitting: Hematology and Oncology

## 2021-10-18 DIAGNOSIS — C3491 Malignant neoplasm of unspecified part of right bronchus or lung: Secondary | ICD-10-CM | POA: Insufficient documentation

## 2021-10-18 MED ORDER — HEPARIN SOD (PORK) LOCK FLUSH 100 UNIT/ML IV SOLN
INTRAVENOUS | Status: AC
  Filled 2021-10-18: qty 5

## 2021-10-18 MED ORDER — HEPARIN SOD (PORK) LOCK FLUSH 100 UNIT/ML IV SOLN
500.0000 [IU] | Freq: Once | INTRAVENOUS | Status: AC
  Administered 2021-10-18: 500 [IU] via INTRAVENOUS

## 2021-10-18 MED ORDER — IOHEXOL 300 MG/ML  SOLN
100.0000 mL | Freq: Once | INTRAMUSCULAR | Status: AC | PRN
Start: 2021-10-18 — End: 2021-10-18
  Administered 2021-10-18: 100 mL via INTRAVENOUS

## 2021-10-20 ENCOUNTER — Encounter: Payer: Self-pay | Admitting: Physician Assistant

## 2021-10-20 ENCOUNTER — Telehealth: Payer: Self-pay | Admitting: *Deleted

## 2021-10-20 ENCOUNTER — Encounter: Payer: Self-pay | Admitting: Hematology and Oncology

## 2021-10-20 NOTE — Telephone Encounter (Signed)
TCT patient and spoke with her regarding her recent CT scans. Advised that she does not have any new disease, no recurrence..  Also advised that the referral to [facility] for a 2nd opinion has been fax'd as of today. Advised that hopefully she will hear from them this week or next.  She voiced understanding.

## 2021-10-20 NOTE — Telephone Encounter (Signed)
-----   Message from Orson Slick, MD sent at 10/20/2021 11:31 AM EDT ----- Please let Elizabeth Mayer know that her CT scan shows stable disease. No evidence of recurrent/residual disease in the chest/abdomen/pelvis. Please double check on the status of her referral to [facility] for a second opinion.   ----- Message ----- From: Interface, Rad Results In Sent: 10/20/2021  11:24 AM EDT To: Orson Slick, MD

## 2021-10-27 ENCOUNTER — Other Ambulatory Visit: Payer: Self-pay

## 2021-10-31 ENCOUNTER — Other Ambulatory Visit: Payer: Self-pay | Admitting: Emergency Medicine

## 2021-11-04 ENCOUNTER — Other Ambulatory Visit: Payer: Self-pay

## 2021-11-04 ENCOUNTER — Other Ambulatory Visit: Payer: Self-pay | Admitting: Hematology and Oncology

## 2021-11-04 ENCOUNTER — Telehealth: Payer: Self-pay

## 2021-11-04 DIAGNOSIS — C3491 Malignant neoplasm of unspecified part of right bronchus or lung: Secondary | ICD-10-CM

## 2021-11-04 NOTE — Telephone Encounter (Signed)
Re-faxed referral for 2nd opinion (Dr. Loletta Specter or Dr. Sharlet Salina) to [facility]. Fax: [phone]. Sent secure e-mail to [employer] - New Patient Coordinator to apprise of referral.

## 2021-11-06 ENCOUNTER — Encounter: Payer: Self-pay | Admitting: Nurse Practitioner

## 2021-11-07 ENCOUNTER — Inpatient Hospital Stay: Payer: Medicaid Other

## 2021-11-07 ENCOUNTER — Inpatient Hospital Stay: Payer: Medicaid Other | Admitting: Hematology and Oncology

## 2021-11-27 ENCOUNTER — Inpatient Hospital Stay: Payer: Medicaid Other

## 2021-11-27 ENCOUNTER — Other Ambulatory Visit: Payer: Self-pay

## 2021-11-27 ENCOUNTER — Inpatient Hospital Stay (HOSPITAL_BASED_OUTPATIENT_CLINIC_OR_DEPARTMENT_OTHER): Payer: Medicaid Other | Admitting: Hematology and Oncology

## 2021-11-27 ENCOUNTER — Inpatient Hospital Stay: Payer: Medicaid Other | Attending: Internal Medicine

## 2021-11-27 VITALS — BP 156/90 | HR 79 | Temp 97.7°F | Resp 16 | Wt 146.7 lb

## 2021-11-27 DIAGNOSIS — Z95828 Presence of other vascular implants and grafts: Secondary | ICD-10-CM | POA: Diagnosis not present

## 2021-11-27 DIAGNOSIS — R5383 Other fatigue: Secondary | ICD-10-CM | POA: Diagnosis not present

## 2021-11-27 DIAGNOSIS — R42 Dizziness and giddiness: Secondary | ICD-10-CM | POA: Diagnosis not present

## 2021-11-27 DIAGNOSIS — D6481 Anemia due to antineoplastic chemotherapy: Secondary | ICD-10-CM | POA: Diagnosis not present

## 2021-11-27 DIAGNOSIS — C7931 Secondary malignant neoplasm of brain: Secondary | ICD-10-CM | POA: Diagnosis not present

## 2021-11-27 DIAGNOSIS — C3491 Malignant neoplasm of unspecified part of right bronchus or lung: Secondary | ICD-10-CM | POA: Diagnosis present

## 2021-11-27 DIAGNOSIS — Z87891 Personal history of nicotine dependence: Secondary | ICD-10-CM | POA: Diagnosis not present

## 2021-11-27 DIAGNOSIS — M549 Dorsalgia, unspecified: Secondary | ICD-10-CM | POA: Insufficient documentation

## 2021-11-27 DIAGNOSIS — I10 Essential (primary) hypertension: Secondary | ICD-10-CM | POA: Insufficient documentation

## 2021-11-27 DIAGNOSIS — G629 Polyneuropathy, unspecified: Secondary | ICD-10-CM | POA: Insufficient documentation

## 2021-11-27 LAB — CBC WITH DIFFERENTIAL (CANCER CENTER ONLY)
Abs Immature Granulocytes: 0.01 10*3/uL (ref 0.00–0.07)
Basophils Absolute: 0 10*3/uL (ref 0.0–0.1)
Basophils Relative: 1 %
Eosinophils Absolute: 0.2 10*3/uL (ref 0.0–0.5)
Eosinophils Relative: 4 %
HCT: 35.9 % — ABNORMAL LOW (ref 36.0–46.0)
Hemoglobin: 11.9 g/dL — ABNORMAL LOW (ref 12.0–15.0)
Immature Granulocytes: 0 %
Lymphocytes Relative: 29 %
Lymphs Abs: 1.1 10*3/uL (ref 0.7–4.0)
MCH: 29.5 pg (ref 26.0–34.0)
MCHC: 33.1 g/dL (ref 30.0–36.0)
MCV: 89.1 fL (ref 80.0–100.0)
Monocytes Absolute: 0.4 10*3/uL (ref 0.1–1.0)
Monocytes Relative: 11 %
Neutro Abs: 2.1 10*3/uL (ref 1.7–7.7)
Neutrophils Relative %: 55 %
Platelet Count: 145 10*3/uL — ABNORMAL LOW (ref 150–400)
RBC: 4.03 MIL/uL (ref 3.87–5.11)
RDW: 13.1 % (ref 11.5–15.5)
WBC Count: 3.9 10*3/uL — ABNORMAL LOW (ref 4.0–10.5)
nRBC: 0 % (ref 0.0–0.2)

## 2021-11-27 LAB — TSH: TSH: 1.741 u[IU]/mL (ref 0.350–4.500)

## 2021-11-27 LAB — CMP (CANCER CENTER ONLY)
ALT: 7 U/L (ref 0–44)
AST: 15 U/L (ref 15–41)
Albumin: 4.1 g/dL (ref 3.5–5.0)
Alkaline Phosphatase: 48 U/L (ref 38–126)
Anion gap: 3 — ABNORMAL LOW (ref 5–15)
BUN: 11 mg/dL (ref 8–23)
CO2: 32 mmol/L (ref 22–32)
Calcium: 9.6 mg/dL (ref 8.9–10.3)
Chloride: 107 mmol/L (ref 98–111)
Creatinine: 1.13 mg/dL — ABNORMAL HIGH (ref 0.44–1.00)
GFR, Estimated: 55 mL/min — ABNORMAL LOW (ref >60)
Glucose, Bld: 85 mg/dL (ref 70–99)
Potassium: 3.5 mmol/L (ref 3.5–5.1)
Sodium: 142 mmol/L (ref 135–145)
Total Bilirubin: 0.3 mg/dL (ref 0.3–1.2)
Total Protein: 6.9 g/dL (ref 6.5–8.1)

## 2021-11-27 MED ORDER — HEPARIN SOD (PORK) LOCK FLUSH 100 UNIT/ML IV SOLN
500.0000 [IU] | Freq: Once | INTRAVENOUS | Status: AC
  Administered 2021-11-27: 500 [IU]

## 2021-11-27 MED ORDER — HEPARIN SOD (PORK) LOCK FLUSH 100 UNIT/ML IV SOLN: 250.0000 [IU] | Freq: Once | INTRAVENOUS | Status: DC

## 2021-11-27 MED ORDER — SODIUM CHLORIDE 0.9% FLUSH
10.0000 mL | Freq: Once | INTRAVENOUS | Status: AC
  Administered 2021-11-27: 10 mL

## 2021-11-27 NOTE — Progress Notes (Signed)
[place] [phone]   [phone]  PROGRESS NOTE  Patient Care Team: Minette Brine, FNP as PCP - General (General Practice)  Hematological/Oncological History # Metastatic Adenocarcinoma of the Lung #Brain Metastasis 1) 11/08/2019: establish care with Dr. Julien Nordmann and his PA Cassie Heilingoetter. Noted to have widely metastatic lung cancer with involvement of the brain, lymph nodes, and right lung.  2) 12/04/2019: palliative radiation to the right lung mass/cervical adenopathy 3) 9/7/02021: started therapy with Carbo/Pem/Pem 4) 9/29-10/09/2019: patient underwent SRS to the brain mets 5) 03/22/2020: repeat CT C/A/P and neck scheduled. Transfer care to Dr. Lorenso Courier.   6) 03/22/2020: CT C/A/P showed interval significant improvement in previously demonstrated extensive confluent lymphadenopathy in the superior mediastinum and right hilar regions. 7) 08/15/2020: Holding chemotherapy due to poor Hgb and fatigue. Allowing patient a brief 'chemo holiday' to rebound appropriately from last cycle.  8) 08/29/2020: restarted chemotherapy, Cycle 12 Day 1 9) 09/19/2020: patient requested to HOLD Cycle 13 in setting of fatigue/weakness.  10) 11/29/2020: Cycle 14 Day 1 of chemotherapy, delayed start due to insurance issues.  11) 11/27/2021: Patient requesting an extended chemotherapy holiday after discussion of care with [facility].   Interval History:  Elizabeth Mayer 61 y.o. female with medical history significant for metastatic adenocarcinoma of the lung who presents for follow up. The patient's last visit was on 10/17/2021.  In the interim since her last visit she has seen a second opinion at [facility] with Dr. Carlis Abbott.  On exam today Elizabeth Mayer reports that she "needs to take her life back".  She notes that she would like to continue receiving CT scans but after discussion with the physicians at [facility] she would like to  take an extended holiday off chemotherapy.  She reports that she feels "a little better without the chemotherapy".  She notes that she does still continue to have a lot of side effects.  She continues to struggle with vertigo.  I thinks it may be "coming from somewhere else".  She notes that her neuropathy in her legs and her dizziness are her biggest concerns.  She continues to have some swelling in her knees and feet.  She has an MRI brain coming up at [facility] which has not yet been scheduled.  She is not having any difficulties with shortness of breath, chest pain, or cough.  She notes her oxygen machine broke and is not using it right now.  She continues to have fatigue and reports her energy is about a 5 out of 10.  Her appetite has been good and her blood pressure is elevated.  She would like to be seen again in October.  She denies any nausea, vomiting, or diarrhea.  She denies fevers, chills, night sweats, chest pain or cough. She has no other complaints.  A full 10 point ROS is listed below.   MEDICAL HISTORY:  Past Medical History:  Diagnosis Date   Anemia    Angina 1982   related to stress   Asthma    in the past,  no current problems   Atrial fibrillation (HCC)    Complication of anesthesia    states anesthesia made her hair fall out   Constipation    Dyspnea    on oxygen at home - 3L via [place]   Fatigue    GERD (gastroesophageal reflux disease)    patient denies this dx   Headache    Heart murmur 1970s  no problems currently   Hypertension    Ingrown toenail    Lung cancer ([place]) 10/2019   metastatic disease to the brain   On home oxygen therapy    2L via [place] - 24 hours a day   Past heart attack 1980-1981   pt states she passed out and woke up in hospital- told she had heart attack, but then dr said he couldn't find anything wrong.   Pneumonia    x 1    SURGICAL HISTORY: Past Surgical History:  Procedure Laterality Date   CERVICAL DISC SURGERY  2000   Disc removed from  neck    ingrown toe nail surgery Bilateral    IR IMAGING GUIDED PORT INSERTION  02/16/2020   MULTIPLE TOOTH EXTRACTIONS     for braces   RADIOLOGY WITH ANESTHESIA N/A 12/05/2019   Procedure: MRI BRAIN WITH AND WITHOUT CONTRAST;  Surgeon: Radiologist, Medication, MD;  Location: [place];  Service: Radiology;  Laterality: N/A;   RADIOLOGY WITH ANESTHESIA N/A 01/02/2020   Procedure: MRI BRAIN WITH AND WITHOUT CONTRAST;  Surgeon: Radiologist, Medication, MD;  Location: [place];  Service: Radiology;  Laterality: N/A;   RADIOLOGY WITH ANESTHESIA N/A 02/20/2020   Procedure: MRI WITH ANESTHESIA OF BRAIN WITH AND WITHOUT CONTRAST;  Surgeon: Radiologist, Medication, MD;  Location: [place];  Service: Radiology;  Laterality: N/A;   RADIOLOGY WITH ANESTHESIA N/A 06/11/2020   Procedure: MRI WITH ANESTHESIA OF BRAIN WITH AND WITHOUT CONTRAST;  Surgeon: Radiologist, Medication, MD;  Location: [place];  Service: Radiology;  Laterality: N/A;   RADIOLOGY WITH ANESTHESIA N/A 10/10/2020   Procedure: MRI WITH ANESTHESIA BRAIN WITH AND WITHOUT CONTRAST;  Surgeon: Radiologist, Medication, MD;  Location: [place];  Service: Radiology;  Laterality: N/A;   RADIOLOGY WITH ANESTHESIA N/A 01/30/2021   Procedure: MRI BRAIN WITH AND WITHOUT CONTRASTWITH ANESTHESIA;  Surgeon: Radiologist, Medication, MD;  Location: [place];  Service: Radiology;  Laterality: N/A;   RADIOLOGY WITH ANESTHESIA N/A 06/12/2021   Procedure: MRI WITH ANESTHESIA OF BRAIN WITH AND WITHOUT CONTRAST;  Surgeon: Radiologist, Medication, MD;  Location: [place];  Service: Radiology;  Laterality: N/A;   RADIOLOGY WITH ANESTHESIA N/A 09/11/2021   Procedure: MRI OF BRAIN WITH AND WITHOUT CONTRAST WITH ANESTHESIA;  Surgeon: Radiologist, Medication, MD;  Location: [place];  Service: Radiology;  Laterality: N/A;   TONSILLECTOMY      SOCIAL HISTORY: Social History   Socioeconomic History   Marital status: Legally Separated    Spouse name: Not on file   Number of children: 0   Years of  education: Not on file   Highest education level: Not on file  Occupational History   Occupation: Secondary school teacher  Tobacco Use   Smoking status: Former    Packs/day: 1.00    Years: 20.00    Total pack years: 20.00    Types: Cigarettes    Start date: 52    Quit date: 2001    Years since quitting: 22.6   Smokeless tobacco: Never  Vaping Use   Vaping Use: Never used  Substance and Sexual Activity   Alcohol use: No   Drug use: No   Sexual activity: Not Currently    Birth control/protection: None, Post-menopausal  Other Topics Concern   Not on file  Social History Narrative   Not on file   Social Determinants of Health   Financial Resource Strain: High Risk (11/10/2019)   Overall Financial Resource Strain (CARDIA)    Difficulty of Paying Living Expenses: Hard  Food Insecurity: Food Insecurity Present (09/16/2021)   Hunger Vital Sign    Worried About Running Out of Food in the Last Year: Never true    Ran Out of Food in the Last Year: Sometimes true  Transportation Needs: No Transportation Needs (09/16/2021)   PRAPARE - Hydrologist (Medical): No    Lack of Transportation (Non-Medical): No  Physical Activity: Inactive (11/10/2019)   Exercise Vital Sign    Days of Exercise per Week: 0 days    Minutes of Exercise per Session: 0 min  Stress: Not on file  Social Connections: Moderately Isolated (11/10/2019)   Social Connection and Isolation Panel [NHANES]    Frequency of Communication with Friends and Family: Twice a week    Frequency of Social Gatherings with Friends and Family: Twice a week    Attends Religious Services: More than 4 times per year    Active Member of [employer] or Organizations: No    Attends Music therapist: Not on file    Marital Status: Separated  Intimate Partner Violence: Not on file    FAMILY HISTORY: Family History  Problem Relation Age of Onset   Arthritis Mother    Heart failure Father     ALLERGIES:  is  allergic to sudafed [pseudoephedrine], gabapentin, mobic [meloxicam], penicillins, and prednisone.  MEDICATIONS:  Current Outpatient Medications  Medication Sig Dispense Refill   acetaminophen (TYLENOL) 500 MG tablet Take 1,000 mg by mouth 2 (two) times daily as needed (pain.).     amLODipine (NORVASC) 5 MG tablet Take 1 tablet (5 mg total) by mouth daily. 90 tablet 1   atorvastatin (LIPITOR) 10 MG tablet Take at bedtime once a day MWF 45 tablet 1   benzoyl peroxide (CERAVE ACNE FOAMING CREAM) 4 % external liquid Apply 1 application topically 2 (two) times daily. Face     Blood Pressure Monitoring (BLOOD PRESSURE MONITOR AUTOMAT) DEVI Check Blood pressure at home daily 1 each 0   Carboxymethylcellulose Sodium (REFRESH CELLUVISC OP) Place 2 drops into both eyes in the morning and at bedtime.     cetaphil (CETAPHIL) lotion Apply 1 application. topically as needed for dry skin.     clindamycin (CLEOCIN T) 1 % external solution Apply 1 application topically 2 (two) times daily.     cyclobenzaprine (FLEXERIL) 10 MG tablet Take 1 tablet (10 mg total) by mouth 3 (three) times daily as needed for muscle spasms. 30 tablet 0   diltiazem (CARDIZEM) 30 MG tablet Take 1 tablet (30 mg total) by mouth 4 (four) times daily. (Patient taking differently: Take 30 mg by mouth 4 (four) times daily as needed (afib episodes).) 120 tablet 1   fexofenadine (ALLEGRA) 180 MG tablet Take 180 mg by mouth in the morning.     fluticasone (FLONASE) 50 MCG/ACT nasal spray Place 1 spray into both nostrils in the morning.     folic acid (FOLVITE) 1 MG tablet Take 1 tablet (1 mg total) by mouth daily. 90 tablet 1   guaiFENesin (MUCINEX) 600 MG 12 hr tablet Take 600 mg by mouth 2 (two) times daily.      HYDROcodone-acetaminophen (NORCO/VICODIN) 5-325 MG tablet Take 1 tablet by mouth every 6 (six) hours as needed for moderate pain. 30 tablet 0   lidocaine-prilocaine (EMLA) cream Apply 1 application topically as needed. 30 g 2    Multiple Vitamin (MULTIVITAMIN WITH MINERALS) TABS tablet Take 1 tablet by mouth daily. Alive     ondansetron (ZOFRAN) 8  MG tablet TAKE 1 TABLET BY MOUTH EVERY 8 HOURS AS NEEDED FOR NAUSEA OR VOMITING 20 tablet 0   OXYGEN Inhale 3 L/min into the lungs See admin instructions. At bedtime & as needed during the day     Pembrolizumab (KEYTRUDA IV) Inject into the vein. Every 3 weeks     Polyethyl Glycol-Propyl Glycol (LUBRICANT EYE DROPS) 0.4-0.3 % SOLN Apply 1-2 drops to eye 3 (three) times daily as needed (dry/irritated eyes).     Soap & Cleansers (MEDERMA AG FACIAL TONER) LIQD Apply 1 application topically 2 (two) times daily. Advance dry skin     SYMBICORT 80-4.5 MCG/ACT inhaler INHALE 2 PUFFS BY MOUTH IN THE MORNING AND AT BEDTIME 11 g 0   triamcinolone ointment (KENALOG) 0.1 % Apply 1 application topically in the morning and at bedtime.     VENTOLIN HFA 108 (90 Base) MCG/ACT inhaler INHALE 1 TO 2 PUFFS BY MOUTH EVERY 6 HOURS AS NEEDED FOR WHEEZING OR SHORTNESS OF BREATH 18 g 3   No current facility-administered medications for this visit.    REVIEW OF SYSTEMS:   Constitutional: ( - ) fevers, ( - )  chills , ( - ) night sweats Eyes: ( - ) blurriness of vision, ( - ) double vision, ( - ) watery eyes Ears, nose, mouth, throat, and face: ( - ) mucositis, ( - ) sore throat Respiratory: ( - ) cough, ( - ) dyspnea, ( - ) wheezes Cardiovascular: ( - ) palpitation, ( - ) chest discomfort, ( +) lower extremity swelling Gastrointestinal:  ( - ) nausea, ( - ) heartburn, ( - ) change in bowel habits Skin: ( - ) abnormal skin rashes Lymphatics: ( - ) new lymphadenopathy, ( - ) easy bruising Neurological: ( - ) numbness, ( - ) tingling, ( - ) new weaknesses Behavioral/Psych: ( - ) mood change, ( - ) new changes  All other systems were reviewed with the patient and are negative.  PHYSICAL EXAMINATION: ECOG PERFORMANCE STATUS: 2 - Symptomatic, <50% confined to bed  Vitals:   11/27/21 0906  BP: (!)  156/90  Pulse: 79  Resp: 16  Temp: 97.7 F (36.5 C)  SpO2: 99%      Filed Weights   11/27/21 0906  Weight: 146 lb 11.2 oz (66.5 kg)    GENERAL: well appearing middle aged [place] American female. alert, no distress and comfortable SKIN: skin color, texture, turgor are normal, no rashes or significant lesions EYES: conjunctiva are pink and non-injected, sclera clear LUNGS: clear to auscultation and percussion with normal breathing effort HEART: regular rate & rhythm and no murmurs and +1 pitting bilateral lower extremity edema Musculoskeletal: no cyanosis of digits and no clubbing.  PSYCH: alert & oriented x 3, fluent speech NEURO: no focal motor/sensory deficits   LABORATORY DATA:  I have reviewed the data as listed    Latest Ref Rng & Units 11/27/2021    8:43 AM 10/17/2021   10:05 AM 09/25/2021   10:17 AM  CBC  WBC 4.0 - 10.5 K/uL 3.9  5.0  4.2   Hemoglobin 12.0 - 15.0 g/dL 11.9  11.4  10.9   Hematocrit 36.0 - 46.0 % 35.9  33.9  32.8   Platelets 150 - 400 K/uL 145  140  142        Latest Ref Rng & Units 11/27/2021    8:43 AM 10/17/2021   10:05 AM 09/25/2021   10:17 AM  CMP  Glucose 70 - 99 mg/dL  85  119  133   BUN 8 - 23 mg/dL 11  16  15    Creatinine 0.44 - 1.00 mg/dL 1.13  1.23  1.18   Sodium 135 - 145 mmol/L 142  141  142   Potassium 3.5 - 5.1 mmol/L 3.5  3.9  3.5   Chloride 98 - 111 mmol/L 107  105  107   CO2 22 - 32 mmol/L 32  31  30   Calcium 8.9 - 10.3 mg/dL 9.6  9.4  9.5   Total Protein 6.5 - 8.1 g/dL 6.9  6.7  6.8   Total Bilirubin 0.3 - 1.2 mg/dL 0.3  0.3  0.3   Alkaline Phos 38 - 126 U/L 48  44  41   AST 15 - 41 U/L 15  16  15    ALT 0 - 44 U/L 7  8  8      RADIOGRAPHIC STUDIES: No results found.   ASSESSMENT & PLAN Elizabeth Mayer 61 y.o. female with medical history significant for metastatic adenocarcinoma of the lung who presents for follow up.    After review the labs, the records, discussion with the patient the findings are most consistent  with metastatic adenocarcinoma the lung without any targetable mutations.  The patient has been started on carboplatin pemetrexed pembrolizumab every 3 weeks with Dr. Julien Nordmann, however the patient has expressed a desire to transition to another provider.  As such we were happy to take up her care here.  She already discontinued the carboplatin and is on maintenance pemetrexed/pembrolizumab therapy. We assumed her care with Cycle 6 on 04/04/2020.  # Metastatic Adenocarcinoma of the Lung #Brain Metastasis  --no targetable mutations noted on prior workup. --continue to follow with Dr. Laqueta Due Onc for metastatic spread to the brain.  --monitor CMP, CBC, and TSH with each treatment --can consider docetaxel/ramucirumab at time of progression.  Plan: --CT scan shows stable disease in April 2023. Next scan in July 2023. She reports she will call to have this scheduled.  --MRI brain from 09/11/2021 shows stable disease. Next due Sept/Oct 2023.  --patient requested to HOLD treatment at this time and discuss further with a second opinion provider.  --Labs today were reviewed and stable from prior.  --RTC in 3 weeks, will keep chemotherapy scheduled.   # Second Opinion --had a conversation with the patient about her desire for 2nd opinion. Her current therapy is effective but she wishes to delay until she discusses with another provider --had unusual reasoning as noted above. Conversation was odd as we have had good interactions with the patient for almost 2 years.  --she wants a referral outside of the [facility] system, preferably to [facility].  -- will make referral per patient request.   #Chemotherapy Induced Anemia --Hgb 11.9, MCV 89.1 today, stable from prior --continue to monitor   # Fatigue: --Uncertain etiology but likely chemotherapy/immunotherapy side effects  --Monitor closely  # Mid back pain: --no findings of metastasis to spine or structural problems with back on last imaging.     #hypokalemia, resolved: --potassium level is 3.9 --continue to monitor  #Bilateral ankle edema: --gave prescription for compression stockings --+1 pitting edema of LE bilaterally seen on physical exam  #Memory changes/vision changes/dizziness --Memory loss present for the last several weeks, mainly with word recognition and difficulty remembering daily tasks.  --Floaters in lower visual field of left eye, occasional.  --Dizziness is more frequent but shorter in duration.  --Most recent MRI brain from 09/11/2021 was stable. Patient  has continued follow up with Dr. Mickeal Skinner   No orders of the defined types were placed in this encounter.  All questions were answered. The patient knows to call the clinic with any problems, questions or concerns.  I have spent a total of 30 minutes minutes of face-to-face and non-face-to-face time, preparing to see the patient, obtaining and/or reviewing separately obtained history, performing a medically appropriate examination, counseling and educating the patient, ordering tests, documenting clinical information in the electronic health record and care coordination.   Ledell Peoples, MD Department of Hematology/Oncology [place] [facility] Phone: [phone] Pager: [phone] Email: [email or web address]  12/06/2021 7:23 PM

## 2021-12-04 ENCOUNTER — Telehealth: Payer: Self-pay | Admitting: Radiation Therapy

## 2021-12-04 ENCOUNTER — Other Ambulatory Visit: Payer: Self-pay | Admitting: Radiation Therapy

## 2021-12-04 NOTE — Telephone Encounter (Signed)
Called and spoke with the patient about the brain MRI with sedation at [facility] in October. She has been instructed to arrive at [facility] admitting department at 8:00am on 10/12 and that this is a brain MRI using sedation to help her tolerate the scan due to her severe claustrophobia. She was thankful for the call and did not have any other questions at this time.   Mont Dutton R.T.(R)(T) Radiation Special Procedures Navigator

## 2021-12-06 ENCOUNTER — Encounter: Payer: Self-pay | Admitting: Hematology and Oncology

## 2021-12-06 ENCOUNTER — Encounter: Payer: Self-pay | Admitting: Physician Assistant

## 2021-12-06 ENCOUNTER — Other Ambulatory Visit: Payer: Self-pay | Admitting: Hematology and Oncology

## 2021-12-06 DIAGNOSIS — C3491 Malignant neoplasm of unspecified part of right bronchus or lung: Secondary | ICD-10-CM

## 2021-12-06 NOTE — Progress Notes (Signed)
ON PATHWAY REGIMEN - Non-Small Cell Lung  No Change  Continue With Treatment as Ordered.  Original Decision Date/Time: 12/06/2019 17:01     A cycle is every 21 days:     Pembrolizumab      Pemetrexed      Carboplatin   **Always confirm dose/schedule in your pharmacy ordering system**  Patient Characteristics: Stage IV Metastatic, Nonsquamous, Initial Chemotherapy/Immunotherapy, PS = 0, 1, ALK Rearrangement Negative and ROS1 Rearrangement Negative and NTRK Gene Fusion?Negative and RET Gene Fusion?Negative and EGFR Mutation Negative, PD-L1 Expression Positive   ? 50% (TPS) and Immunotherapy Candidate Therapeutic Status: Stage IV Metastatic Histology: Nonsquamous Cell ROS1 Rearrangement Status: Negative Other Mutations/Biomarkers: No Other Actionable Mutations Chemotherapy/Immunotherapy LOT: Initial Chemotherapy/Immunotherapy Molecular Targeted Therapy: Not Appropriate KRAS G12C Mutation Status: Negative MET Exon 14 Mutation Status: Negative RET Gene Fusion Status: Negative EGFR Mutation Status: Negative/Wild Type NTRK Gene Fusion Status: Negative PD-L1 Expression Status: PD-L1 Positive ? 50% (TPS) ALK Rearrangement Status: Negative BRAF V600E Mutation Status: Negative ECOG Performance Status: 1 Biomarker Assessment Status Confirmation: All Genomic Markers Negative, or Only MET+ or BRAF+ or KRAS G12C+ Immunotherapy Candidate Status: Candidate for Immunotherapy Intent of Therapy: Non-Curative / Palliative Intent, Discussed with Patient

## 2021-12-12 ENCOUNTER — Other Ambulatory Visit: Payer: Self-pay | Admitting: Emergency Medicine

## 2022-01-05 ENCOUNTER — Encounter: Payer: Self-pay | Admitting: Family Medicine

## 2022-01-08 ENCOUNTER — Inpatient Hospital Stay: Payer: Medicaid Other | Attending: Internal Medicine

## 2022-01-08 ENCOUNTER — Other Ambulatory Visit: Payer: Medicaid Other

## 2022-01-08 ENCOUNTER — Other Ambulatory Visit: Payer: Self-pay

## 2022-01-08 ENCOUNTER — Telehealth: Payer: Self-pay | Admitting: Hematology and Oncology

## 2022-01-08 ENCOUNTER — Inpatient Hospital Stay (HOSPITAL_BASED_OUTPATIENT_CLINIC_OR_DEPARTMENT_OTHER): Payer: Medicaid Other | Admitting: Hematology and Oncology

## 2022-01-08 VITALS — BP 140/67 | HR 85 | Temp 97.5°F | Resp 16 | Wt 141.5 lb

## 2022-01-08 DIAGNOSIS — R5383 Other fatigue: Secondary | ICD-10-CM | POA: Diagnosis not present

## 2022-01-08 DIAGNOSIS — I10 Essential (primary) hypertension: Secondary | ICD-10-CM | POA: Diagnosis not present

## 2022-01-08 DIAGNOSIS — R519 Headache, unspecified: Secondary | ICD-10-CM | POA: Insufficient documentation

## 2022-01-08 DIAGNOSIS — Z87891 Personal history of nicotine dependence: Secondary | ICD-10-CM | POA: Diagnosis not present

## 2022-01-08 DIAGNOSIS — Z79899 Other long term (current) drug therapy: Secondary | ICD-10-CM | POA: Insufficient documentation

## 2022-01-08 DIAGNOSIS — D6481 Anemia due to antineoplastic chemotherapy: Secondary | ICD-10-CM | POA: Insufficient documentation

## 2022-01-08 DIAGNOSIS — C7931 Secondary malignant neoplasm of brain: Secondary | ICD-10-CM | POA: Insufficient documentation

## 2022-01-08 DIAGNOSIS — C3491 Malignant neoplasm of unspecified part of right bronchus or lung: Secondary | ICD-10-CM | POA: Insufficient documentation

## 2022-01-08 DIAGNOSIS — M549 Dorsalgia, unspecified: Secondary | ICD-10-CM | POA: Diagnosis not present

## 2022-01-08 LAB — CBC WITH DIFFERENTIAL (CANCER CENTER ONLY)
Abs Immature Granulocytes: 0 10*3/uL (ref 0.00–0.07)
Basophils Absolute: 0 10*3/uL (ref 0.0–0.1)
Basophils Relative: 1 %
Eosinophils Absolute: 0.2 10*3/uL (ref 0.0–0.5)
Eosinophils Relative: 3 %
HCT: 37.7 % (ref 36.0–46.0)
Hemoglobin: 12.4 g/dL (ref 12.0–15.0)
Immature Granulocytes: 0 %
Lymphocytes Relative: 25 %
Lymphs Abs: 1.3 10*3/uL (ref 0.7–4.0)
MCH: 30 pg (ref 26.0–34.0)
MCHC: 32.9 g/dL (ref 30.0–36.0)
MCV: 91.1 fL (ref 80.0–100.0)
Monocytes Absolute: 0.4 10*3/uL (ref 0.1–1.0)
Monocytes Relative: 8 %
Neutro Abs: 3.3 10*3/uL (ref 1.7–7.7)
Neutrophils Relative %: 63 %
Platelet Count: 142 10*3/uL — ABNORMAL LOW (ref 150–400)
RBC: 4.14 MIL/uL (ref 3.87–5.11)
RDW: 12.6 % (ref 11.5–15.5)
WBC Count: 5.1 10*3/uL (ref 4.0–10.5)
nRBC: 0 % (ref 0.0–0.2)

## 2022-01-08 LAB — CMP (CANCER CENTER ONLY)
ALT: 6 U/L (ref 0–44)
AST: 16 U/L (ref 15–41)
Albumin: 4.1 g/dL (ref 3.5–5.0)
Alkaline Phosphatase: 42 U/L (ref 38–126)
Anion gap: 6 (ref 5–15)
BUN: 12 mg/dL (ref 8–23)
CO2: 31 mmol/L (ref 22–32)
Calcium: 9.4 mg/dL (ref 8.9–10.3)
Chloride: 105 mmol/L (ref 98–111)
Creatinine: 1.28 mg/dL — ABNORMAL HIGH (ref 0.44–1.00)
GFR, Estimated: 47 mL/min — ABNORMAL LOW (ref >60)
Glucose, Bld: 85 mg/dL (ref 70–99)
Potassium: 3.8 mmol/L (ref 3.5–5.1)
Sodium: 142 mmol/L (ref 135–145)
Total Bilirubin: 0.4 mg/dL (ref 0.3–1.2)
Total Protein: 7.4 g/dL (ref 6.5–8.1)

## 2022-01-08 LAB — TSH: TSH: 1.357 u[IU]/mL (ref 0.350–4.500)

## 2022-01-08 NOTE — Progress Notes (Signed)
[place] [phone]   [phone]  PROGRESS NOTE  Patient Care Team: Minette Brine, FNP as PCP - General (General Practice)  Hematological/Oncological History # Metastatic Adenocarcinoma of the Lung #Brain Metastasis 1) 11/08/2019: establish care with Dr. Julien Nordmann and his PA Cassie Heilingoetter. Noted to have widely metastatic lung cancer with involvement of the brain, lymph nodes, and right lung.  2) 12/04/2019: palliative radiation to the right lung mass/cervical adenopathy 3) 9/7/02021: started therapy with Carbo/Pem/Pem 4) 9/29-10/09/2019: patient underwent SRS to the brain mets 5) 03/22/2020: repeat CT C/A/P and neck scheduled. Transfer care to Dr. Lorenso Courier.   6) 03/22/2020: CT C/A/P showed interval significant improvement in previously demonstrated extensive confluent lymphadenopathy in the superior mediastinum and right hilar regions. 7) 08/15/2020: Holding chemotherapy due to poor Hgb and fatigue. Allowing patient a brief 'chemo holiday' to rebound appropriately from last cycle.  8) 08/29/2020: restarted chemotherapy, Cycle 12 Day 1 9) 09/19/2020: patient requested to HOLD Cycle 13 in setting of fatigue/weakness.  10) 11/29/2020: Cycle 14 Day 1 of chemotherapy, delayed start due to insurance issues.  11) 11/27/2021: Patient requesting an extended chemotherapy holiday after discussion of care with [facility].   Interval History:  Lorelle A Azimi 61 y.o. female with medical history significant for metastatic adenocarcinoma of the lung who presents for follow up. The patient's last visit was on 12/06/2021.  In the interim since her last visit she has continued on a treatment holiday.   On exam today Mrs. Munsch reports she has been "pretty much okay" in the interim since her last visit.  She reports that she is enjoying her holiday off treatment and that she feels like her symptoms are "getting there".  She notes that she is not as good  as she has hoped and that she is pushing herself too hard.  She recently had members getting sick though "nothing serious".  Her energy level is currently 8 out of 10.  She notes her appetite is good there is not quite where she wants it to be.  She notes that she does not often feel hungry and has to remind herself to eat.  She can forget to have meals on occasion.  Her weight is down 5 pounds since August.  She reports that she would like to stay on chemo holiday indefinitely.  She does note however the results of the upcoming MRI and CT scan may change her decision making.  She denies any nausea, vomiting, or diarrhea.  She denies fevers, chills, night sweats, chest pain or cough. She has no other complaints.  A full 10 point ROS is listed below.   MEDICAL HISTORY:  Past Medical History:  Diagnosis Date   Anemia    Angina 1982   related to stress   Asthma    in the past,  no current problems   Atrial fibrillation (HCC)    Complication of anesthesia    states anesthesia made her hair fall out   Constipation    Dyspnea    on oxygen at home - 3L via [place]   Fatigue    GERD (gastroesophageal reflux disease)    patient denies this dx   Headache    Heart murmur 1970s   no problems currently   Hypertension    Ingrown toenail    Lung cancer ([place]) 10/2019   metastatic disease to the brain   On home oxygen therapy    2L via [place] - 24 hours a day  Past heart attack 1980-1981   pt states she passed out and woke up in hospital- told she had heart attack, but then dr said he couldn't find anything wrong.   Pneumonia    x 1    SURGICAL HISTORY: Past Surgical History:  Procedure Laterality Date   CERVICAL DISC SURGERY  2000   Disc removed from neck    ingrown toe nail surgery Bilateral    IR IMAGING GUIDED PORT INSERTION  02/16/2020   MULTIPLE TOOTH EXTRACTIONS     for braces   RADIOLOGY WITH ANESTHESIA N/A 12/05/2019   Procedure: MRI BRAIN WITH AND WITHOUT CONTRAST;  Surgeon: Radiologist,  Medication, MD;  Location: [place];  Service: Radiology;  Laterality: N/A;   RADIOLOGY WITH ANESTHESIA N/A 01/02/2020   Procedure: MRI BRAIN WITH AND WITHOUT CONTRAST;  Surgeon: Radiologist, Medication, MD;  Location: [place];  Service: Radiology;  Laterality: N/A;   RADIOLOGY WITH ANESTHESIA N/A 02/20/2020   Procedure: MRI WITH ANESTHESIA OF BRAIN WITH AND WITHOUT CONTRAST;  Surgeon: Radiologist, Medication, MD;  Location: [place];  Service: Radiology;  Laterality: N/A;   RADIOLOGY WITH ANESTHESIA N/A 06/11/2020   Procedure: MRI WITH ANESTHESIA OF BRAIN WITH AND WITHOUT CONTRAST;  Surgeon: Radiologist, Medication, MD;  Location: [place];  Service: Radiology;  Laterality: N/A;   RADIOLOGY WITH ANESTHESIA N/A 10/10/2020   Procedure: MRI WITH ANESTHESIA BRAIN WITH AND WITHOUT CONTRAST;  Surgeon: Radiologist, Medication, MD;  Location: [place];  Service: Radiology;  Laterality: N/A;   RADIOLOGY WITH ANESTHESIA N/A 01/30/2021   Procedure: MRI BRAIN WITH AND WITHOUT CONTRASTWITH ANESTHESIA;  Surgeon: Radiologist, Medication, MD;  Location: [place];  Service: Radiology;  Laterality: N/A;   RADIOLOGY WITH ANESTHESIA N/A 06/12/2021   Procedure: MRI WITH ANESTHESIA OF BRAIN WITH AND WITHOUT CONTRAST;  Surgeon: Radiologist, Medication, MD;  Location: [place];  Service: Radiology;  Laterality: N/A;   RADIOLOGY WITH ANESTHESIA N/A 09/11/2021   Procedure: MRI OF BRAIN WITH AND WITHOUT CONTRAST WITH ANESTHESIA;  Surgeon: Radiologist, Medication, MD;  Location: [place];  Service: Radiology;  Laterality: N/A;   TONSILLECTOMY      SOCIAL HISTORY: Social History   Socioeconomic History   Marital status: Legally Separated    Spouse name: Not on file   Number of children: 0   Years of education: Not on file   Highest education level: Not on file  Occupational History   Occupation: Secondary school teacher  Tobacco Use   Smoking status: Former    Packs/day: 1.00    Years: 20.00    Total pack years: 20.00    Types: Cigarettes    Start  date: 47    Quit date: 2001    Years since quitting: 22.7   Smokeless tobacco: Never  Vaping Use   Vaping Use: Never used  Substance and Sexual Activity   Alcohol use: No   Drug use: No   Sexual activity: Not Currently    Birth control/protection: None, Post-menopausal  Other Topics Concern   Not on file  Social History Narrative   Not on file   Social Determinants of Health   Financial Resource Strain: High Risk (11/10/2019)   Overall Financial Resource Strain (CARDIA)    Difficulty of Paying Living Expenses: Hard  Food Insecurity: Food Insecurity Present (09/16/2021)   Hunger Vital Sign    Worried About Running Out of Food in the Last Year: Never true    Ran Out of Food in the Last Year: Sometimes true  Transportation Needs: No  Transportation Needs (09/16/2021)   PRAPARE - Hydrologist (Medical): No    Lack of Transportation (Non-Medical): No  Physical Activity: Inactive (11/10/2019)   Exercise Vital Sign    Days of Exercise per Week: 0 days    Minutes of Exercise per Session: 0 min  Stress: Not on file  Social Connections: Moderately Isolated (11/10/2019)   Social Connection and Isolation Panel [NHANES]    Frequency of Communication with Friends and Family: Twice a week    Frequency of Social Gatherings with Friends and Family: Twice a week    Attends Religious Services: More than 4 times per year    Active Member of [employer] or Organizations: No    Attends Music therapist: Not on file    Marital Status: Separated  Intimate Partner Violence: Not on file    FAMILY HISTORY: Family History  Problem Relation Age of Onset   Arthritis Mother    Heart failure Father     ALLERGIES:  is allergic to sudafed [pseudoephedrine], gabapentin, mobic [meloxicam], penicillins, and prednisone.  MEDICATIONS:  Current Outpatient Medications  Medication Sig Dispense Refill   acetaminophen (TYLENOL) 500 MG tablet Take 1,000 mg by mouth 2 (two)  times daily as needed (pain.).     amLODipine (NORVASC) 5 MG tablet Take 1 tablet (5 mg total) by mouth daily. 90 tablet 1   atorvastatin (LIPITOR) 10 MG tablet Take at bedtime once a day MWF 45 tablet 1   benzoyl peroxide (CERAVE ACNE FOAMING CREAM) 4 % external liquid Apply 1 application topically 2 (two) times daily. Face     Blood Pressure Monitoring (BLOOD PRESSURE MONITOR AUTOMAT) DEVI Check Blood pressure at home daily 1 each 0   Carboxymethylcellulose Sodium (REFRESH CELLUVISC OP) Place 2 drops into both eyes in the morning and at bedtime.     cetaphil (CETAPHIL) lotion Apply 1 application. topically as needed for dry skin.     clindamycin (CLEOCIN T) 1 % external solution Apply 1 application topically 2 (two) times daily.     cyclobenzaprine (FLEXERIL) 10 MG tablet Take 1 tablet (10 mg total) by mouth 3 (three) times daily as needed for muscle spasms. 30 tablet 0   diltiazem (CARDIZEM) 30 MG tablet Take 1 tablet (30 mg total) by mouth 4 (four) times daily. (Patient taking differently: Take 30 mg by mouth 4 (four) times daily as needed (afib episodes).) 120 tablet 1   fexofenadine (ALLEGRA) 180 MG tablet Take 180 mg by mouth in the morning.     fluticasone (FLONASE) 50 MCG/ACT nasal spray Place 1 spray into both nostrils in the morning.     folic acid (FOLVITE) 1 MG tablet Take 1 tablet (1 mg total) by mouth daily. 90 tablet 1   guaiFENesin (MUCINEX) 600 MG 12 hr tablet Take 600 mg by mouth 2 (two) times daily.      HYDROcodone-acetaminophen (NORCO/VICODIN) 5-325 MG tablet Take 1 tablet by mouth every 6 (six) hours as needed for moderate pain. 30 tablet 0   lidocaine-prilocaine (EMLA) cream Apply 1 application topically as needed. 30 g 2   Multiple Vitamin (MULTIVITAMIN WITH MINERALS) TABS tablet Take 1 tablet by mouth daily. Alive     ondansetron (ZOFRAN) 8 MG tablet TAKE 1 TABLET BY MOUTH EVERY 8 HOURS AS NEEDED FOR NAUSEA OR VOMITING 20 tablet 0   OXYGEN Inhale 3 L/min into the lungs See  admin instructions. At bedtime & as needed during the day  Pembrolizumab (KEYTRUDA IV) Inject into the vein. Every 3 weeks     Polyethyl Glycol-Propyl Glycol (LUBRICANT EYE DROPS) 0.4-0.3 % SOLN Apply 1-2 drops to eye 3 (three) times daily as needed (dry/irritated eyes).     Soap & Cleansers (MEDERMA AG FACIAL TONER) LIQD Apply 1 application topically 2 (two) times daily. Advance dry skin     SYMBICORT 80-4.5 MCG/ACT inhaler INHALE 2 PUFFS BY MOUTH IN THE MORNING AND AT BEDTIME 11 g 0   triamcinolone ointment (KENALOG) 0.1 % Apply 1 application topically in the morning and at bedtime.     VENTOLIN HFA 108 (90 Base) MCG/ACT inhaler INHALE 1 TO 2 PUFFS BY MOUTH EVERY 6 HOURS AS NEEDED FOR WHEEZING OR SHORTNESS OF BREATH 18 g 3   No current facility-administered medications for this visit.    REVIEW OF SYSTEMS:   Constitutional: ( - ) fevers, ( - )  chills , ( - ) night sweats Eyes: ( - ) blurriness of vision, ( - ) double vision, ( - ) watery eyes Ears, nose, mouth, throat, and face: ( - ) mucositis, ( - ) sore throat Respiratory: ( - ) cough, ( - ) dyspnea, ( - ) wheezes Cardiovascular: ( - ) palpitation, ( - ) chest discomfort, ( +) lower extremity swelling Gastrointestinal:  ( - ) nausea, ( - ) heartburn, ( - ) change in bowel habits Skin: ( - ) abnormal skin rashes Lymphatics: ( - ) new lymphadenopathy, ( - ) easy bruising Neurological: ( - ) numbness, ( - ) tingling, ( - ) new weaknesses Behavioral/Psych: ( - ) mood change, ( - ) new changes  All other systems were reviewed with the patient and are negative.  PHYSICAL EXAMINATION: ECOG PERFORMANCE STATUS: 2 - Symptomatic, <50% confined to bed  Vitals:   01/08/22 1050  BP: (!) 140/67  Pulse: 85  Resp: 16  Temp: (!) 97.5 F (36.4 C)  SpO2: 99%      Filed Weights   01/08/22 1050  Weight: 141 lb 8 oz (64.2 kg)    GENERAL: well appearing middle aged [place] American female. alert, no distress and comfortable SKIN: skin  color, texture, turgor are normal, no rashes or significant lesions EYES: conjunctiva are pink and non-injected, sclera clear LUNGS: clear to auscultation and percussion with normal breathing effort HEART: regular rate & rhythm and no murmurs and +1 pitting bilateral lower extremity edema Musculoskeletal: no cyanosis of digits and no clubbing.  PSYCH: alert & oriented x 3, fluent speech NEURO: no focal motor/sensory deficits   LABORATORY DATA:  I have reviewed the data as listed    Latest Ref Rng & Units 01/08/2022   10:25 AM 11/27/2021    8:43 AM 10/17/2021   10:05 AM  CBC  WBC 4.0 - 10.5 K/uL 5.1  3.9  5.0   Hemoglobin 12.0 - 15.0 g/dL 12.4  11.9  11.4   Hematocrit 36.0 - 46.0 % 37.7  35.9  33.9   Platelets 150 - 400 K/uL 142  145  140        Latest Ref Rng & Units 01/08/2022   10:25 AM 11/27/2021    8:43 AM 10/17/2021   10:05 AM  CMP  Glucose 70 - 99 mg/dL 85  85  119   BUN 8 - 23 mg/dL _0 Creatinine 0.44 - 1.00 mg/dL 1.28  1.13  1.23   Sodium 135 - 145 mmol/L 142  142  141  Potassium 3.5 - 5.1 mmol/L 3.8  3.5  3.9   Chloride 98 - 111 mmol/L 105  107  105   CO2 22 - 32 mmol/L 31  32  31   Calcium 8.9 - 10.3 mg/dL 9.4  9.6  9.4   Total Protein 6.5 - 8.1 g/dL 7.4  6.9  6.7   Total Bilirubin 0.3 - 1.2 mg/dL 0.4  0.3  0.3   Alkaline Phos 38 - 126 U/L 42  48  44   AST 15 - 41 U/L _0 ALT 0 - 44 U/L _1 RADIOGRAPHIC STUDIES: No results found.   ASSESSMENT & PLAN Mitzy A Masek 61 y.o. female with medical history significant for metastatic adenocarcinoma of the lung who presents for follow up.    After review the labs, the records, discussion with the patient the findings are most consistent with metastatic adenocarcinoma the lung without any targetable mutations.  The patient has been started on carboplatin pemetrexed pembrolizumab every 3 weeks with Dr. Julien Nordmann, however the patient has expressed a desire to transition to another provider.  As  such we were happy to take up her care here.  She already discontinued the carboplatin and is on maintenance pemetrexed/pembrolizumab therapy. We assumed her care with Cycle 6 on 04/04/2020.  # Metastatic Adenocarcinoma of the Lung #Brain Metastasis  --no targetable mutations noted on prior workup. --continue to follow with Dr. Laqueta Due Onc for metastatic spread to the brain.  --monitor CMP, CBC, and TSH with each treatment --can consider docetaxel/ramucirumab at time of progression.  Plan: --CT scan shows stable disease in July 2023. Next scan in Oct 2023. Order requested today.  --MRI brain from 09/11/2021 shows stable disease. Next due Oct 2023.  --patient requested to HOLD treatment for a treatment holiday. She would like this to continue indefinitely, but is able agreeable to reassess after the upcoming CT/MRI.  --Labs today were reviewed and stable from prior.  --RTC in 3 weeks, will keep chemotherapy scheduled.   # Second Opinion --had a conversation with the patient about her desire for 2nd opinion. Her current therapy is effective but she wishes to delay until she discusses with another provider --had unusual reasoning as noted above. Conversation was odd as we have had good interactions with the patient for almost 2 years.  --she wants a referral outside of the [facility] system, preferably to [facility].  -- patient met with [place], recommended treatment holiday. Recommended RTC PRN  #Chemotherapy Induced Anemia --Hgb 12.4, MCV 91.1 today, stable from prior --continue to monitor   # Fatigue: --Uncertain etiology but likely chemotherapy/immunotherapy side effects  --Monitor closely  # Mid back pain: --no findings of metastasis to spine or structural problems with back on last imaging.    #Bilateral ankle edema: --gave prescription for compression stockings --+1 pitting edema of LE bilaterally seen on physical exam  #Memory changes/vision changes/dizziness --Memory loss  present for the last several weeks, mainly with word recognition and difficulty remembering daily tasks.  --Floaters in lower visual field of left eye, occasional.  --Dizziness is more frequent but shorter in duration.  --Most recent MRI brain from 09/11/2021 was stable. Patient has continued follow up with Dr. Mickeal Skinner   Orders Placed This Encounter  Procedures   CT CHEST ABDOMEN PELVIS W CONTRAST    Standing Status:   Future    Standing Expiration Date:   01/09/2023    Order Specific  Question:   Preferred imaging location?    Answer:   [facility]    Order Specific Question:   Is Oral Contrast requested for this exam?    Answer:   Yes, Per Radiology protocol   All questions were answered. The patient knows to call the clinic with any problems, questions or concerns.  I have spent a total of 30 minutes minutes of face-to-face and non-face-to-face time, preparing to see the patient, obtaining and/or reviewing separately obtained history, performing a medically appropriate examination, counseling and educating the patient, ordering tests, documenting clinical information in the electronic health record and care coordination.   Ledell Peoples, MD Department of Hematology/Oncology [place] at [facility] Phone: [phone] Pager: [phone] Email: [email or web address]  01/08/2022 11:44 AM

## 2022-01-08 NOTE — Telephone Encounter (Signed)
Per 10/5 los called and no answer and unable to leave message  calender is being mailed

## 2022-01-09 ENCOUNTER — Other Ambulatory Visit: Payer: Self-pay

## 2022-01-11 ENCOUNTER — Other Ambulatory Visit: Payer: Self-pay

## 2022-01-14 ENCOUNTER — Other Ambulatory Visit: Payer: Self-pay

## 2022-01-14 ENCOUNTER — Encounter (HOSPITAL_COMMUNITY): Payer: Self-pay

## 2022-01-14 NOTE — Progress Notes (Signed)
Pt sts she is not able to arrive for her MRI tomorrow until 0800.

## 2022-01-14 NOTE — Anesthesia Preprocedure Evaluation (Addendum)
Anesthesia Evaluation  Patient identified by MRN, date of birth, ID band Patient awake    Reviewed: Allergy & Precautions, NPO status , Patient's Chart, lab work & pertinent test results  History of Anesthesia Complications Negative for: history of anesthetic complications  Airway Mallampati: II  TM Distance: >3 FB Neck ROM: Full    Dental no notable dental hx. (+) Dental Advisory Given   Pulmonary shortness of breath, asthma , COPD,  COPD inhaler and oxygen dependent, former smoker,  Lung ca with mets    Pulmonary exam normal        Cardiovascular hypertension, Pt. on medications + angina Normal cardiovascular exam     Neuro/Psych  Headaches, BRAIN METASTASES S/p ACDF  Neuromuscular disease    GI/Hepatic Neg liver ROS, GERD  ,  Endo/Other  negative endocrine ROS  Renal/GU Renal InsufficiencyRenal disease     Musculoskeletal  (+) Arthritis , Osteoarthritis,    Abdominal   Peds  Hematology  (+) Blood dyscrasia, anemia ,   Anesthesia Other Findings Day of surgery medications reviewed with the patient.  Reproductive/Obstetrics                            Anesthesia Physical  Anesthesia Plan  ASA: 4  Anesthesia Plan: General   Post-op Pain Management: Minimal or no pain anticipated   Induction: Intravenous  PONV Risk Score and Plan: 3 and Dexamethasone, Ondansetron, Midazolam and Treatment may vary due to age or medical condition  Airway Management Planned: LMA  Additional Equipment: None  Intra-op Plan:   Post-operative Plan: Extubation in OR  Informed Consent: I have reviewed the patients History and Physical, chart, labs and discussed the procedure including the risks, benefits and alternatives for the proposed anesthesia with the patient or authorized representative who has indicated his/her understanding and acceptance.     Dental advisory given  Plan Discussed with:  Anesthesiologist and CRNA  Anesthesia Plan Comments:        Anesthesia Quick Evaluation

## 2022-01-14 NOTE — Progress Notes (Signed)
S.D.W- Instructions   Your procedure is scheduled on Thurs., Oct. 12, 2023 from 10:00AM- 11:00AM.  Report to [facility] Main Entrance "A" at 7:30 A.M., then check in with the Admitting office.  Call this number if you have problems the morning of surgery:  [phone]   Remember:  Do not eat or drink after midnight on Oct. 11th    Take these medicines the morning of surgery with A SIP OF WATER: Carboxymethylcellulose Sodium (REFRESH CELLUVISC OP) Fexofenadine (ALLEGRA) Fluticasone (FLONASE) GuaiFENesin ([place]) SYMBICORT  If Needed: Acetaminophen (TYLENOL) Diltiazem (CARDIZEM)  Ondansetron (ZOFRAN) VENTOLIN HFA  As of today, STOP taking any Aspirin (unless otherwise instructed by your surgeon) Aleve, Naproxen, Ibuprofen, Motrin, Advil, Goody's, BC's, all herbal medications, fish oil, and all vitamins.    Do not wear jewelry or makeup. Do not wear lotions, powders, perfumes or deodorant. Do not shave 48 hours prior to surgery.  Do not bring valuables to the hospital. Do not wear nail polish, gel polish, artificial nails, or any other type of covering on natural nails (fingers and toes) If you have artificial nails or gel coating that need to be removed by a nail salon, please have this removed prior to surgery. Artificial nails or gel coating may interfere with anesthesia's ability to adequately monitor your vital signs.  [facility] is not responsible for any belongings or valuables.    Do NOT Smoke (Tobacco/Vaping)  24 hours prior to your procedure  If you use a CPAP at night, you may bring your mask for your overnight stay.   Contacts, glasses, hearing aids, dentures or partials may not be worn into surgery, please bring cases for these belongings   For patients admitted to the hospital, discharge time will be determined by your treatment team.   Patients discharged the day of surgery will not be allowed to drive home, and someone needs to stay with them for 24  hours.  Special instructions:    Oral Hygiene is also important to reduce your risk of infection.  Remember - BRUSH YOUR TEETH THE MORNING OF SURGERY WITH YOUR REGULAR TOOTHPASTE  [place]- Preparing For Surgery  Before surgery, you can play an important role. Because skin is not sterile, your skin needs to be as free of germs as possible. You can reduce the number of germs on your skin by washing with Antibacterial Soap before surgery.     Please follow these instructions carefully.     Shower the NIGHT BEFORE SURGERY and the MORNING OF SURGERY with Antibacterial Soap.   Pat yourself dry with a CLEAN TOWEL.  Wear CLEAN PAJAMAS to bed the night before surgery  Place CLEAN SHEETS on your bed the night before your surgery  DO NOT SLEEP WITH PETS.  Day of Surgery:  Take a shower with Antibacterial soap. Wear Clean/Comfortable clothing the morning of surgery Do not apply any deodorants/lotions.   Remember to brush your teeth WITH YOUR REGULAR TOOTHPASTE.   If you test positive for Covid, or been in contact with anyone that has tested positive in the last 10 days, please notify your surgeon.  SURGICAL WAITING ROOM VISITATION Patients having surgery or a procedure may have no more than 2 support people in the waiting area - these visitors may rotate.   Children under the age of 13 must have an adult with them who is not the patient. If the patient needs to stay at the hospital during part of their recovery, the visitor guidelines for inpatient rooms  apply. Pre-op nurse will coordinate an appropriate time for 1 support person to accompany patient in pre-op.  This support person may not rotate.   Please refer to the [facility] website for the visitor guidelines for Inpatients (after your surgery is over and you are in a regular room).

## 2022-01-14 NOTE — Progress Notes (Signed)
PCP - FNP Othelia Pulling  Cardiologist - Denies  EP- Denies  Endocrine- Denies  Pulm- Denies  Chest x-ray - 03/25/21 (E)  EKG - 07/17/21 (E)  Stress Test - Denies  ECHO - Denies  Cardiac Cath - Denies  AICD-na PM-na LOOP-na  Nerve Stimulator- Denies  Dialysis- Denies  Sleep Study - Denies CPAP - Denies  LABS- 01/08/22(E): CBC w/D, CMP  ASA- Denies  ERAS- No   HA1C- Denies  Anesthesia- No  Pt denies having chest pain, sob, or fever during the pre-op phone call. All instructions explained to the pt, with a verbal understanding of the material. Pt also instructed to wear a mask and social distance if she goes out. The opportunity to ask questions was provided.

## 2022-01-15 ENCOUNTER — Ambulatory Visit (HOSPITAL_BASED_OUTPATIENT_CLINIC_OR_DEPARTMENT_OTHER): Payer: Medicaid Other | Admitting: Anesthesiology

## 2022-01-15 ENCOUNTER — Encounter (HOSPITAL_COMMUNITY): Payer: Self-pay

## 2022-01-15 ENCOUNTER — Ambulatory Visit (HOSPITAL_COMMUNITY)
Admission: RE | Admit: 2022-01-15 | Discharge: 2022-01-15 | Disposition: A | Discharge status: Home / Self Care | Payer: Medicaid Other | Source: Ambulatory Visit | Attending: Internal Medicine | Admitting: Internal Medicine

## 2022-01-15 ENCOUNTER — Encounter (HOSPITAL_COMMUNITY): Admission: AD | Disposition: A | Discharge status: Home / Self Care | Payer: Self-pay | Source: Ambulatory Visit

## 2022-01-15 ENCOUNTER — Ambulatory Visit (HOSPITAL_COMMUNITY): Payer: Medicaid Other | Admitting: Anesthesiology

## 2022-01-15 ENCOUNTER — Ambulatory Visit (HOSPITAL_COMMUNITY)
Admission: AD | Admit: 2022-01-15 | Discharge: 2022-01-15 | Disposition: A | Discharge status: Home / Self Care | Payer: Medicaid Other | Source: Ambulatory Visit | Attending: Internal Medicine | Admitting: Internal Medicine

## 2022-01-15 DIAGNOSIS — C3490 Malignant neoplasm of unspecified part of unspecified bronchus or lung: Secondary | ICD-10-CM | POA: Insufficient documentation

## 2022-01-15 DIAGNOSIS — C7931 Secondary malignant neoplasm of brain: Secondary | ICD-10-CM

## 2022-01-15 DIAGNOSIS — C801 Malignant (primary) neoplasm, unspecified: Secondary | ICD-10-CM | POA: Diagnosis not present

## 2022-01-15 DIAGNOSIS — D630 Anemia in neoplastic disease: Secondary | ICD-10-CM

## 2022-01-15 DIAGNOSIS — I10 Essential (primary) hypertension: Secondary | ICD-10-CM | POA: Diagnosis not present

## 2022-01-15 DIAGNOSIS — Z87891 Personal history of nicotine dependence: Secondary | ICD-10-CM | POA: Diagnosis not present

## 2022-01-15 DIAGNOSIS — J449 Chronic obstructive pulmonary disease, unspecified: Secondary | ICD-10-CM | POA: Diagnosis not present

## 2022-01-15 DIAGNOSIS — Z9981 Dependence on supplemental oxygen: Secondary | ICD-10-CM | POA: Insufficient documentation

## 2022-01-15 DIAGNOSIS — Z79899 Other long term (current) drug therapy: Secondary | ICD-10-CM | POA: Insufficient documentation

## 2022-01-15 HISTORY — PX: RADIOLOGY WITH ANESTHESIA: SHX6223

## 2022-01-15 SURGERY — MRI WITH ANESTHESIA
Anesthesia: General

## 2022-01-15 MED ORDER — FENTANYL CITRATE (PF) 250 MCG/5ML IJ SOLN
INTRAMUSCULAR | Status: DC | PRN
  Administered 2022-01-15 (×2): 50 ug via INTRAVENOUS

## 2022-01-15 MED ORDER — ONDANSETRON HCL 4 MG/2ML IJ SOLN
INTRAMUSCULAR | Status: DC | PRN
  Administered 2022-01-15: 4 mg via INTRAVENOUS

## 2022-01-15 MED ORDER — ORAL CARE MOUTH RINSE: 15.0000 mL | Freq: Once | OROMUCOSAL | Status: AC

## 2022-01-15 MED ORDER — GADOBUTROL 1 MMOL/ML IV SOLN
6.0000 mL | Freq: Once | INTRAVENOUS | Status: AC | PRN
  Administered 2022-01-15: 6 mL via INTRAVENOUS

## 2022-01-15 MED ORDER — LACTATED RINGERS IV SOLN: INTRAVENOUS | Status: DC

## 2022-01-15 MED ORDER — LIDOCAINE 2% (20 MG/ML) 5 ML SYRINGE
INTRAMUSCULAR | Status: DC | PRN
  Administered 2022-01-15: 60 mg via INTRAVENOUS

## 2022-01-15 MED ORDER — CHLORHEXIDINE GLUCONATE 0.12 % MT SOLN
15.0000 mL | Freq: Once | OROMUCOSAL | Status: AC
  Administered 2022-01-15: 15 mL via OROMUCOSAL
  Filled 2022-01-15: qty 15

## 2022-01-15 MED ORDER — PHENYLEPHRINE HCL-NACL 20-0.9 MG/250ML-% IV SOLN
INTRAVENOUS | Status: DC | PRN
  Administered 2022-01-15: 25 ug/min via INTRAVENOUS

## 2022-01-15 MED ORDER — DEXAMETHASONE SODIUM PHOSPHATE 10 MG/ML IJ SOLN
INTRAMUSCULAR | Status: DC | PRN
  Administered 2022-01-15: 4 mg via INTRAVENOUS

## 2022-01-15 MED ORDER — PROPOFOL 10 MG/ML IV BOLUS
INTRAVENOUS | Status: DC | PRN
  Administered 2022-01-15: 110 mg via INTRAVENOUS

## 2022-01-15 NOTE — Anesthesia Procedure Notes (Signed)
Procedure Name: LMA Insertion Date/Time: 01/15/2022 10:41 AM  Performed by: Bryson Corona, CRNAPre-anesthesia Checklist: Patient identified, Emergency Drugs available, Suction available and Patient being monitored Patient Re-evaluated:Patient Re-evaluated prior to induction Oxygen Delivery Method: Circle System Utilized Preoxygenation: Pre-oxygenation with 100% oxygen Induction Type: IV induction Ventilation: Mask ventilation without difficulty LMA: LMA with gastric port inserted LMA Size: 4.0 Number of attempts: 1 Airway Equipment and Method: Bite block Placement Confirmation: positive ETCO2 Tube secured with: Tape Dental Injury: Teeth and Oropharynx as per pre-operative assessment

## 2022-01-15 NOTE — Anesthesia Postprocedure Evaluation (Signed)
Anesthesia Post Note  Patient: Elizabeth Mayer  Procedure(s) Performed: MRI BRAIN WITH AND WITHOUT CONTRAST WITH ANESTHESIA     Patient location during evaluation: PACU Anesthesia Type: General Level of consciousness: sedated Pain management: pain level controlled Vital Signs Assessment: post-procedure vital signs reviewed and stable Respiratory status: spontaneous breathing and respiratory function stable Cardiovascular status: stable Postop Assessment: no apparent nausea or vomiting Anesthetic complications: no   No notable events documented.  Last Vitals:  Vitals:   01/15/22 1205 01/15/22 1220  BP: (!) 155/76 (!) 150/78  Pulse: 63 64  Resp: 14 15  Temp:  36.4 C  SpO2: 96% 96%    Last Pain:  Vitals:   01/15/22 1220  TempSrc:   PainSc: 0-No pain                 Jeshawn Melucci DANIEL

## 2022-01-15 NOTE — Transfer of Care (Signed)
Immediate Anesthesia Transfer of Care Note  Patient: Elizabeth Mayer  Procedure(s) Performed: MRI BRAIN WITH AND WITHOUT CONTRAST WITH ANESTHESIA  Patient Location: PACU  Anesthesia Type:General  Level of Consciousness: drowsy and patient cooperative  Airway & Oxygen Therapy: Patient Spontanous Breathing and Patient connected to nasal cannula oxygen  Post-op Assessment: Report given to RN and Post -op Vital signs reviewed and stable  Post vital signs: Reviewed and stable  Last Vitals:  Vitals Value Taken Time  BP 155/81 01/15/22 1151  Temp 36.4 C 01/15/22 1150  Pulse 65 01/15/22 1156  Resp 11 01/15/22 1156  SpO2 100 % 01/15/22 1156  Vitals shown include unvalidated device data.  Last Pain:  Vitals:   01/15/22 1150  TempSrc:   PainSc: 0-No pain      Patients Stated Pain Goal: 0 (62/13/08 6578)  Complications: No notable events documented.

## 2022-01-17 ENCOUNTER — Other Ambulatory Visit: Payer: Self-pay | Admitting: Emergency Medicine

## 2022-01-19 ENCOUNTER — Inpatient Hospital Stay: Payer: Medicaid Other

## 2022-01-20 ENCOUNTER — Inpatient Hospital Stay (HOSPITAL_BASED_OUTPATIENT_CLINIC_OR_DEPARTMENT_OTHER): Payer: Medicaid Other | Admitting: Internal Medicine

## 2022-01-20 ENCOUNTER — Other Ambulatory Visit: Payer: Self-pay

## 2022-01-20 VITALS — BP 127/66 | HR 90 | Temp 97.8°F | Resp 17 | Ht 66.0 in | Wt 143.6 lb

## 2022-01-20 DIAGNOSIS — C7931 Secondary malignant neoplasm of brain: Secondary | ICD-10-CM | POA: Diagnosis not present

## 2022-01-20 DIAGNOSIS — C3491 Malignant neoplasm of unspecified part of right bronchus or lung: Secondary | ICD-10-CM | POA: Diagnosis not present

## 2022-01-20 NOTE — Progress Notes (Signed)
[facility] [place], [place] [address] [phone]   Interval Evaluation  Date of Service: 01/20/22 Patient Name: Elizabeth Mayer Patient MRN: [id number] Patient DOB: 30-Aug-1960 Provider: Ventura Sellers, MD  Identifying Statement:  Elizabeth Mayer is a 61 y.o. female with Malignant neoplasm metastatic to brain [facility]) [C79.31]   Primary Cancer:  Oncologic History: Oncology History  Non-small cell lung cancer, right ([place])  11/08/2019 Initial Diagnosis   Non-small cell lung cancer, right ([place])   12/14/2019 - 12/14/2019 Chemotherapy   The patient had cemiplimab-rwlc (LIBTAYO) 350 mg in sodium chloride 0.9 % 100 mL chemo infusion, 350 mg, Intravenous, Once, 0 of 6 cycles  for chemotherapy treatment.    12/14/2019 - 09/25/2021 Chemotherapy   Patient is on Treatment Plan : LUNG CARBOplatin / Pemetrexed / Pembrolizumab q21d Induction x 4 cycles / Maintenance Pemetrexed + Pembrolizumab     12/14/2019 -  Chemotherapy   Patient is on Treatment Plan : LUNG Carboplatin (5) + Pemetrexed (500) + Pembrolizumab (200) D1 q21d Induction x 4 cycles / Maintenance Pemetrexed (500) + Pembrolizumab (200) D1 q21d      CNS Oncologic History 01/12/20: SRS to 22 targets Isidore Moos) 02/11/21: SRS L frontal Isidore Moos)  Interval History:  Elizabeth Mayer presents today for follow up after recent MRI brain with anesthesia.  No new or progressive changes, denies left sided weakness.  Headaches are sporadic.  On surveillance with Dr. Lorenso Courier.  H+P (03/14/20) Patient presents today for follow up after recent MRI brain.  She describes no new or progressive neurologic deficits.  No seizures or headaches.  Still independent with gait and activities of daily living.  Continues on chemotherapy with Dr. Julien Nordmann.  Currently taking decadron 1mg  daily.  Medications: Current Outpatient Medications on File Prior to Visit  Medication Sig Dispense Refill   acetaminophen (TYLENOL)  500 MG tablet Take 1,000 mg by mouth 2 (two) times daily as needed (pain.).     amLODipine (NORVASC) 5 MG tablet Take 1 tablet (5 mg total) by mouth daily. (Patient not taking: Reported on 01/12/2022) 90 tablet 1   atorvastatin (LIPITOR) 10 MG tablet Take at bedtime once a day MWF (Patient not taking: Reported on 01/12/2022) 45 tablet 1   benzoyl peroxide (CERAVE ACNE FOAMING CREAM) 4 % external liquid Apply 1 application topically 2 (two) times daily. Face     Blood Pressure Monitoring (BLOOD PRESSURE MONITOR AUTOMAT) DEVI Check Blood pressure at home daily 1 each 0   camphor-menthol (SARNA) lotion Apply 1 Application topically as needed for itching.     Carboxymethylcellulose Sodium (REFRESH CELLUVISC OP) Place 2 drops into both eyes in the morning and at bedtime.     cetaphil (CETAPHIL) lotion Apply 1 application  topically 2 (two) times daily.     clindamycin (CLEOCIN T) 1 % external solution Apply 1 application topically 2 (two) times daily.     cyclobenzaprine (FLEXERIL) 10 MG tablet Take 1 tablet (10 mg total) by mouth 3 (three) times daily as needed for muscle spasms. 30 tablet 0   diltiazem (CARDIZEM) 30 MG tablet Take 1 tablet (30 mg total) by mouth 4 (four) times daily. (Patient taking differently: Take 30 mg by mouth 4 (four) times daily as needed (afib episodes).) 120 tablet 1   fexofenadine (ALLEGRA) 180 MG tablet Take 180 mg by mouth in the morning.     fluticasone (FLONASE) 50 MCG/ACT nasal spray Place 1 spray into both nostrils in  the morning.     folic acid (FOLVITE) 1 MG tablet Take 1 tablet (1 mg total) by mouth daily. 90 tablet 1   Glycolic Acid 70 % SOLN Apply 1 application  topically daily at 12 noon. Face toner     guaiFENesin (MUCINEX) 600 MG 12 hr tablet Take 600 mg by mouth 2 (two) times daily.      HYDROcodone-acetaminophen (NORCO/VICODIN) 5-325 MG tablet Take 1 tablet by mouth every 6 (six) hours as needed for moderate pain. 30 tablet 0   lidocaine-prilocaine (EMLA) cream  Apply 1 application topically as needed. (Patient not taking: Reported on 01/12/2022) 30 g 2   Menthol, Topical Analgesic, (ICY HOT EX) Apply 1 application  topically daily as needed (feet pain). Roll on     Multiple Minerals-Vitamins (CAL MAG ZINC +D3) TABS Take 3 capsules by mouth daily.     ondansetron (ZOFRAN) 8 MG tablet TAKE 1 TABLET BY MOUTH EVERY 8 HOURS AS NEEDED FOR NAUSEA OR VOMITING 20 tablet 0   OXYGEN Inhale 3 L/min into the lungs See admin instructions. At bedtime & as needed during the day     Polyethyl Glycol-Propyl Glycol (LUBRICANT EYE DROPS) 0.4-0.3 % SOLN Apply 1-2 drops to eye 3 (three) times daily as needed (dry/irritated eyes).     SYMBICORT 80-4.5 MCG/ACT inhaler INHALE 2 PUFFS BY MOUTH IN THE MORNING AND AT BEDTIME 11 g 0   triamcinolone ointment (KENALOG) 0.1 % Apply 1 application topically in the morning and at bedtime.     VENTOLIN HFA 108 (90 Base) MCG/ACT inhaler INHALE 1 TO 2 PUFFS BY MOUTH EVERY 6 HOURS AS NEEDED FOR WHEEZING OR SHORTNESS OF BREATH 18 g 3   No current facility-administered medications on file prior to visit.    Allergies:  Allergies  Allergen Reactions   Sudafed [Pseudoephedrine] Hypertension   Amlodipine Swelling   Atorvastatin Other (See Comments)    Cramping   Gabapentin Other (See Comments)    Raise blood pressure and red rings around eyes Blood vessels popped in her eyes   Mobic [Meloxicam] Swelling    Inflamed the area that has inflammation and stabbing pains in the area   Penicillins Hives     Childhood reaction   Prednisone     Caused A-fib   Past Medical History:  Past Medical History:  Diagnosis Date   Anemia    Angina 1982   related to stress   Asthma    in the past,  no current problems   Atrial fibrillation (HCC)    Complication of anesthesia    states anesthesia made her hair fall out   Constipation    Dyspnea    on oxygen at home - 3L via [place]   Fatigue    GERD (gastroesophageal reflux disease)    patient  denies this dx   Headache    Heart murmur 1970s   no problems currently   Hypertension    Ingrown toenail    Lung cancer ([place]) 10/2019   metastatic disease to the brain   On home oxygen therapy    2L via [place] - 24 hours a day   Past heart attack 1980-1981   pt states she passed out and woke up in hospital- told she had heart attack, but then dr said he couldn't find anything wrong.   Pneumonia    x 1   Past Surgical History:  Past Surgical History:  Procedure Laterality Date   [place] SURGERY  2000  Disc removed from neck    ingrown toe nail surgery Bilateral    IR IMAGING GUIDED PORT INSERTION  02/16/2020   MULTIPLE TOOTH EXTRACTIONS     for braces   RADIOLOGY WITH ANESTHESIA N/A 12/05/2019   Procedure: MRI BRAIN WITH AND WITHOUT CONTRAST;  Surgeon: Radiologist, Medication, MD;  Location: [facility];  Service: Radiology;  Laterality: N/A;   RADIOLOGY WITH ANESTHESIA N/A 01/02/2020   Procedure: MRI BRAIN WITH AND WITHOUT CONTRAST;  Surgeon: Radiologist, Medication, MD;  Location: [place];  Service: Radiology;  Laterality: N/A;   RADIOLOGY WITH ANESTHESIA N/A 02/20/2020   Procedure: MRI WITH ANESTHESIA OF BRAIN WITH AND WITHOUT CONTRAST;  Surgeon: Radiologist, Medication, MD;  Location: [place];  Service: Radiology;  Laterality: N/A;   RADIOLOGY WITH ANESTHESIA N/A 06/11/2020   Procedure: MRI WITH ANESTHESIA OF BRAIN WITH AND WITHOUT CONTRAST;  Surgeon: Radiologist, Medication, MD;  Location: [place];  Service: Radiology;  Laterality: N/A;   RADIOLOGY WITH ANESTHESIA N/A 10/10/2020   Procedure: MRI WITH ANESTHESIA BRAIN WITH AND WITHOUT CONTRAST;  Surgeon: Radiologist, Medication, MD;  Location: [place];  Service: Radiology;  Laterality: N/A;   RADIOLOGY WITH ANESTHESIA N/A 01/30/2021   Procedure: MRI BRAIN WITH AND WITHOUT CONTRASTWITH ANESTHESIA;  Surgeon: Radiologist, Medication, MD;  Location: [place];  Service: Radiology;  Laterality: N/A;   RADIOLOGY WITH ANESTHESIA N/A 06/12/2021    Procedure: MRI WITH ANESTHESIA OF BRAIN WITH AND WITHOUT CONTRAST;  Surgeon: Radiologist, Medication, MD;  Location: [place];  Service: Radiology;  Laterality: N/A;   RADIOLOGY WITH ANESTHESIA N/A 09/11/2021   Procedure: MRI OF BRAIN WITH AND WITHOUT CONTRAST WITH ANESTHESIA;  Surgeon: Radiologist, Medication, MD;  Location: [place];  Service: Radiology;  Laterality: N/A;   RADIOLOGY WITH ANESTHESIA N/A 01/15/2022   Procedure: MRI BRAIN WITH AND WITHOUT CONTRAST WITH ANESTHESIA;  Surgeon: Radiologist, Medication, MD;  Location: [facility];  Service: Radiology;  Laterality: N/A;   TONSILLECTOMY     Social History:  Social History   Socioeconomic History   Marital status: Legally Separated    Spouse name: Not on file   Number of children: 0   Years of education: Not on file   Highest education level: Not on file  Occupational History   Occupation: Secondary school teacher  Tobacco Use   Smoking status: Former    Packs/day: 1.00    Years: 20.00    Total pack years: 20.00    Types: Cigarettes    Start date: 48    Quit date: 2001    Years since quitting: 22.8   Smokeless tobacco: Never  Vaping Use   Vaping Use: Never used  Substance and Sexual Activity   Alcohol use: No   Drug use: No   Sexual activity: Not Currently    Birth control/protection: None, Post-menopausal  Other Topics Concern   Not on file  Social History Narrative   Not on file   Social Determinants of Health   Financial Resource Strain: High Risk (11/10/2019)   Overall Financial Resource Strain (CARDIA)    Difficulty of Paying Living Expenses: Hard  Food Insecurity: Food Insecurity Present (09/16/2021)   Hunger Vital Sign    Worried About Running Out of Food in the Last Year: Never true    Ran Out of Food in the Last Year: Sometimes true  Transportation Needs: No Transportation Needs (09/16/2021)   PRAPARE - Hydrologist (Medical): No    Lack of Transportation (Non-Medical): No  Physical Activity:  Inactive (11/10/2019)   Exercise Vital Sign    Days of Exercise per Week: 0 days    Minutes of Exercise per Session: 0 min  Stress: Not on file  Social Connections: Moderately Isolated (11/10/2019)   Social Connection and Isolation Panel [NHANES]    Frequency of Communication with Friends and Family: Twice a week    Frequency of Social Gatherings with Friends and Family: Twice a week    Attends Religious Services: More than 4 times per year    Active Member of [employer] or Organizations: No    Attends Music therapist: Not on file    Marital Status: Separated  Intimate Partner Violence: Not on file   Family History:  Family History  Problem Relation Age of Onset   Arthritis Mother    Heart failure Father     Review of Systems: Constitutional: Doesn't report fevers, chills or abnormal weight loss Eyes: Doesn't report blurriness of vision Ears, nose, mouth, throat, and face: Doesn't report sore throat Respiratory: Doesn't report cough, dyspnea or wheezes Cardiovascular: Doesn't report palpitation, chest discomfort  Gastrointestinal:  Doesn't report nausea, constipation, diarrhea GU: Doesn't report incontinence Skin: Doesn't report skin rashes Neurological: Per HPI Musculoskeletal: Doesn't report joint pain Behavioral/Psych: Doesn't report anxiety  Physical Exam: Vitals:   01/20/22 1117  BP: 127/66  Pulse: 90  Resp: 17  Temp: 97.8 F (36.6 C)  SpO2: 99%   KPS: 90. General: normal Head: Normal EENT: No conjunctival injection or scleral icterus.  Lungs: Resp effort normal Cardiac: Regular rate Abdomen: Non-distended abdomen Skin: No rashes cyanosis or petechiae. Extremities: No clubbing or edema  Neurologic Exam: Mental Status: Awake, alert, attentive to examiner. Oriented to self and environment. Language is fluent with intact comprehension.  Cranial Nerves: Visual acuity is grossly normal. Visual fields are full. Extra-ocular movements intact. No ptosis.  Face is symmetric Motor: Tone and bulk are normal. Power is full in both arms and legs. Reflexes are symmetric, no pathologic reflexes present.  Sensory: Intact to light touch Gait: Normal.   Labs: I have reviewed the data as listed    Component Value Date/Time   NA 142 01/08/2022 1025   NA 145 (H) 05/13/2021 1220   K 3.8 01/08/2022 1025   CL 105 01/08/2022 1025   CO2 31 01/08/2022 1025   GLUCOSE 85 01/08/2022 1025   BUN 12 01/08/2022 1025   BUN 12 05/13/2021 1220   CREATININE 1.28 (H) 01/08/2022 1025   CALCIUM 9.4 01/08/2022 1025   PROT 7.4 01/08/2022 1025   PROT 6.8 08/26/2020 1546   ALBUMIN 4.1 01/08/2022 1025   ALBUMIN 4.1 08/26/2020 1546   AST 16 01/08/2022 1025   ALT 6 01/08/2022 1025   ALKPHOS 42 01/08/2022 1025   BILITOT 0.4 01/08/2022 1025   GFRNONAA 47 (L) 01/08/2022 1025   GFRAA >60 01/08/2020 1342   GFRAA >60 01/04/2020 0907   Lab Results  Component Value Date   WBC 5.1 01/08/2022   NEUTROABS 3.3 01/08/2022   HGB 12.4 01/08/2022   HCT 37.7 01/08/2022   MCV 91.1 01/08/2022   PLT 142 (L) 01/08/2022    Imaging:  MR BRAIN W WO CONTRAST  Result Date: 01/16/2022 CLINICAL DATA:  61 year old female with metastatic lung cancer. Non-small cell lung cancer originally diagnosed in 2021. Status post two different rounds of SRS treatments to the brain in 2021 and 2022. Some increased T2/FLAIR abnormality on restaging MRI in June. Subsequent encounter. EXAM: MRI HEAD WITHOUT AND WITH CONTRAST TECHNIQUE: Multiplanar,  multiecho pulse sequences of the brain and surrounding structures were obtained without and with intravenous contrast. CONTRAST:  53mL GADAVIST GADOBUTROL 1 MMOL/ML IV SOLN COMPARISON:  09/11/2021 and earlier. FINDINGS: BRAIN New Lesions: Somewhat subtle and linear 5 mm area of abnormal enhancement at the right superior frontal gyrus, pre motor cortex series 1100 image 240 is not identified on prior exams and seems to be a new lesion (although there is moderate  regional chronic abnormal enhancement further detailed below. Only faint T2 and FLAIR hyperintensity here (series 4, image 43). Larger lesions: Nearby plaque-like posterior right frontal lobe enhancement as seen on series 1100, image 231 is chronic but increasing. Regional T2 and FLAIR hyperintensity is stable. No increased mass effect. Multinodular roughly 10 mm enhancing lesion of the posterior left cingulate gyrus on series 1100, image 224 is chronic but increasing. And there is primarily new T2 and FLAIR hyperintensity associated on series 4, image 39. Enhancing lesion located at the right anterior temporal lobe has increased from 8 mm mm to now 11 mm, see series 1100 image 170. Faint regional T2 and FLAIR hyperintensity is stable. Stable or Smaller lesions: Small anterior left frontal lobe metastasis series 1100, image 230. Larger nearby roughly 12 mm nodular and rim enhancing left superior frontal gyrus metastasis on image 248. However, regional T2 and FLAIR hyperintensity has progressed (series 4, image 43). Increased mild regional mass effect. Clustered small nodular lesions along the inferior right parietal lobe near the parieto-occipital sulcus series 1100 images 194 through 202. Faint T2 and FLAIR hyperintensity there is stable. No convincing leptomeningeal thickening or enhancement. No pachymeningeal thickening. Other Brain findings: Stable cerebral volume. No midline shift or ventriculomegaly. Basilar cisterns remain normal. No restricted diffusion suggestive of acute infarction. No extra-axial collection or acute intracranial hemorrhage. Cervicomedullary junction and pituitary are within normal limits. Vascular: Major intracranial vascular flow voids are stable. Skull and upper cervical spine: Stable visible cervical spine degeneration. No abnormal spinal cord enhancement. Background bone marrow signal remains within normal limits. Sinuses/Orbits: Stable and negative orbits. Stable paranasal sinus  mucosal thickening. Other: Mastoids remain well aerated. Mild left mastoid fluid has regressed. Visible internal auditory structures appear normal. Negative visible scalp and face. IMPRESSION: 1. Progression of disease since 09/11/2021: - small, subtle 5 mm linear enhancement in the right pre motor cortex on series 1100, image 240 seems to be a brand new lesion, although is nearly adjacent to: - clustered and plaque-like treated metastatic disease which also appears increased on image 231. Regional T2 and FLAIR hyperintensity here is stable. - clustered and nodular previously treated metastasis in the posterior left cingulate gyrus (image 224), with mostly new regional T2 and FLAIR hyperintensity. - anterior right temporal lobe previously treated lesion has enlarged from 8-11 mm, with stable faint regional T2/FLAIR. 2. Three other areas of treated enhancing metastatic disease are stable in size, although the dominant left superior frontal gyrus lesion demonstrates further increased T2/FLAIR and mild regional mass effect. Electronically Signed   By: Genevie Ann M.D.   On: 01/16/2022 08:40     [facility] Clinician Interpretation: I have personally reviewed the radiological images as listed.  My interpretation, in the context of the patient's clinical presentation, is likely treatment effect   Assessment/Plan Malignant neoplasm metastatic to brain [facility]) [C79.31]  Elizabeth Mayer is clinically stable today.  Brain MRI demonstrates progressive findings affecting 4 previously treated lesions.  Etiology is still favored radiation necrosis over organic tumor progression, although both are possible.  We will  recommend continued imaging surveillance.  For headaches, con't PRN analgesia.   We appreciate the opportunity to participate in the care of [place].    We ask that [place] return to clinic in 3-4 months with brain MRI, or sooner as needed.  All questions were answered. The patient knows to call  the clinic with any problems, questions or concerns. No barriers to learning were detected.  The total time spent in the encounter was 30 minutes and more than 50% was on counseling and review of test results   Ventura Sellers, MD Medical Director of Neuro-Oncology [facility] 01/20/22 11:08 AM

## 2022-01-22 ENCOUNTER — Other Ambulatory Visit: Payer: Self-pay

## 2022-01-26 ENCOUNTER — Ambulatory Visit (HOSPITAL_COMMUNITY)
Admission: RE | Admit: 2022-01-26 | Discharge: 2022-01-26 | Disposition: A | Discharge status: Home / Self Care | Payer: Medicaid Other | Source: Ambulatory Visit | Attending: Hematology and Oncology | Admitting: Hematology and Oncology

## 2022-01-26 DIAGNOSIS — C3491 Malignant neoplasm of unspecified part of right bronchus or lung: Secondary | ICD-10-CM | POA: Insufficient documentation

## 2022-01-26 MED ORDER — IOHEXOL 300 MG/ML  SOLN
100.0000 mL | Freq: Once | INTRAMUSCULAR | Status: AC | PRN
  Administered 2022-01-26: 100 mL via INTRAVENOUS

## 2022-01-29 ENCOUNTER — Telehealth: Payer: Self-pay | Admitting: *Deleted

## 2022-01-29 NOTE — Telephone Encounter (Signed)
TCt patient regarding recent CT scan of chest, abd, pelvis. Spoke with her and advised that there is no new disease, no progression. She has stable scan at this time. Pt voiced understanding. Asked her if she still wants to keep her chemo on hold. She said she does.  She is aware of next appt in December. No further questions or concerns.

## 2022-01-29 NOTE — Telephone Encounter (Signed)
-----   Message from Orson Slick, MD sent at 01/27/2022  1:50 PM EDT ----- Regarding: P2 Please let Ms. Colclasure know that her CT scan shows stable disease.  There is no evidence of progression or new sites of spread.  We will plan to see her back on 03/20/2022 as scheduled.  If she would like to restart treatment sooner we are happy to schedule her at an earlier time.  ----- Message ----- From: Buel Ream, Rad Results In Sent: 01/27/2022  11:36 AM EDT To: Orson Slick, MD

## 2022-02-10 ENCOUNTER — Encounter: Payer: Self-pay | Admitting: Nurse Practitioner

## 2022-02-10 ENCOUNTER — Ambulatory Visit (INDEPENDENT_AMBULATORY_CARE_PROVIDER_SITE_OTHER): Payer: Medicaid Other | Admitting: Nurse Practitioner

## 2022-02-10 VITALS — BP 118/72 | HR 86 | Temp 98.1°F | Ht 66.0 in | Wt 141.6 lb

## 2022-02-10 DIAGNOSIS — I4891 Unspecified atrial fibrillation: Secondary | ICD-10-CM | POA: Diagnosis not present

## 2022-02-10 DIAGNOSIS — Z1211 Encounter for screening for malignant neoplasm of colon: Secondary | ICD-10-CM

## 2022-02-10 DIAGNOSIS — I700 Atherosclerosis of aorta: Secondary | ICD-10-CM | POA: Diagnosis not present

## 2022-02-10 DIAGNOSIS — Z5320 Procedure and treatment not carried out because of patient's decision for unspecified reasons: Secondary | ICD-10-CM

## 2022-02-10 DIAGNOSIS — Z23 Encounter for immunization: Secondary | ICD-10-CM | POA: Diagnosis not present

## 2022-02-10 DIAGNOSIS — J4489 Other specified chronic obstructive pulmonary disease: Secondary | ICD-10-CM

## 2022-02-10 DIAGNOSIS — I129 Hypertensive chronic kidney disease with stage 1 through stage 4 chronic kidney disease, or unspecified chronic kidney disease: Secondary | ICD-10-CM

## 2022-02-10 DIAGNOSIS — Z79899 Other long term (current) drug therapy: Secondary | ICD-10-CM

## 2022-02-10 DIAGNOSIS — N183 Chronic kidney disease, stage 3 unspecified: Secondary | ICD-10-CM | POA: Diagnosis not present

## 2022-02-10 DIAGNOSIS — R499 Unspecified voice and resonance disorder: Secondary | ICD-10-CM

## 2022-02-10 DIAGNOSIS — C3491 Malignant neoplasm of unspecified part of right bronchus or lung: Secondary | ICD-10-CM

## 2022-02-10 DIAGNOSIS — Z0000 Encounter for general adult medical examination without abnormal findings: Secondary | ICD-10-CM

## 2022-02-10 DIAGNOSIS — Z9981 Dependence on supplemental oxygen: Secondary | ICD-10-CM

## 2022-02-10 DIAGNOSIS — R350 Frequency of micturition: Secondary | ICD-10-CM

## 2022-02-10 DIAGNOSIS — E876 Hypokalemia: Secondary | ICD-10-CM

## 2022-02-10 LAB — POCT URINALYSIS DIPSTICK
Bilirubin, UA: NEGATIVE
Blood, UA: NEGATIVE
Glucose, UA: NEGATIVE
Nitrite, UA: NEGATIVE
Protein, UA: NEGATIVE
Spec Grav, UA: 1.025 (ref 1.010–1.025)
Urobilinogen, UA: 0.2 E.U./dL
pH, UA: 5.5 (ref 5.0–8.0)

## 2022-02-10 NOTE — Patient Instructions (Signed)

## 2022-02-10 NOTE — Progress Notes (Signed)
I,Victoria T Hamilton,acting as a Education administrator for Minette Brine, FNP.,have documented all relevant documentation on the behalf of Minette Brine, FNP,as directed by  Minette Brine, FNP while in the presence of Minette Brine, [place].   Subjective:     Patient ID: Elizabeth Mayer , female    DOB: 06/15/1960 , 61 y.o.   MRN: [id number]   Chief Complaint  Patient presents with   Annual Exam    HPI  Patient here for HM.   She reports her oxygen is now as needed.  Patient stated her GYN stated the PAP smear was not important at this time, being anything could come back positive. Cancer is the main focus. Patient cannot remember her GYN.  She was seen at [facility] and said to try her off the Oxygen. Also recommended to stop the chemo and to keep up with the MRI and CT scan to check for growth. She is now back in [place] for her Oncology care due to the brain tumors. She stopped the oxygen last month. She is using her oxygen as needed 1-2 times a day.   Wt Readings from Last 3 Encounters: 02/10/22 : 141 lb 9.6 oz (64.2 kg) 01/20/22 : 143 lb 9.6 oz (65.1 kg) 01/15/22 : 141 lb (64 kg)  She is no longer taking the amlodipine due to swelling to her feet and statin due to leg cramps - she was taking less than 3 times a week. She is trying to drink 8 glasses of water or more. She thought when she stopped the chemo she thought the swelling would get better.     Past Medical History:  Diagnosis Date   Anemia    Angina 1982   related to stress   Asthma    in the past,  no current problems   Atrial fibrillation ([place])    Complication of anesthesia    states anesthesia made her hair fall out   Constipation    Dyspnea    on oxygen at home - 3L via [place]   Fatigue    GERD (gastroesophageal reflux disease)    patient denies this dx   Headache    Heart murmur 1970s   no problems currently   Hypertension    Ingrown toenail    Lung cancer ([place]) 10/2019   metastatic disease to the brain   On home oxygen  therapy    2L via [place] - 24 hours a day   Past heart attack 1980-1981   pt states she passed out and woke up in hospital- told she had heart attack, but then dr said he couldn't find anything wrong.   Pneumonia    x 1     Family History  Problem Relation Age of Onset   Arthritis Mother    Heart failure Father      Current Outpatient Medications:    acetaminophen (TYLENOL) 500 MG tablet, Take 1,000 mg by mouth 2 (two) times daily as needed (pain.)., Disp: , Rfl:    benzoyl peroxide (CERAVE ACNE FOAMING CREAM) 4 % external liquid, Apply 1 application topically 2 (two) times daily. Face, Disp: , Rfl:    Blood Pressure Monitoring (BLOOD PRESSURE MONITOR AUTOMAT) DEVI, Check Blood pressure at home daily, Disp: 1 each, Rfl: 0   Carboxymethylcellulose Sodium (REFRESH CELLUVISC OP), Place 2 drops into both eyes in the morning and at bedtime., Disp: , Rfl:    cetaphil (CETAPHIL) lotion, Apply 1 application  topically 2 (two) times daily., Disp: ,  Rfl:    clindamycin (CLEOCIN T) 1 % external solution, Apply 1 application topically 2 (two) times daily., Disp: , Rfl:    cyclobenzaprine (FLEXERIL) 10 MG tablet, Take 1 tablet (10 mg total) by mouth 3 (three) times daily as needed for muscle spasms., Disp: 30 tablet, Rfl: 0   fexofenadine (ALLEGRA) 180 MG tablet, Take 180 mg by mouth in the morning., Disp: , Rfl:    fluticasone (FLONASE) 50 MCG/ACT nasal spray, Place 1 spray into both nostrils in the morning., Disp: , Rfl:    folic acid (FOLVITE) 1 MG tablet, Take 1 tablet (1 mg total) by mouth daily., Disp: 90 tablet, Rfl: 1   Glycolic Acid 70 % SOLN, Apply 1 application  topically daily at 12 noon. Face toner, Disp: , Rfl:    guaiFENesin (MUCINEX) 600 MG 12 hr tablet, Take 600 mg by mouth 2 (two) times daily. , Disp: , Rfl:    HYDROcodone-acetaminophen (NORCO/VICODIN) 5-325 MG tablet, Take 1 tablet by mouth every 6 (six) hours as needed for moderate pain., Disp: 30 tablet, Rfl: 0   Menthol, Topical  Analgesic, (ICY HOT EX), Apply 1 application  topically daily as needed (feet pain). Roll on, Disp: , Rfl:    ondansetron (ZOFRAN) 8 MG tablet, TAKE 1 TABLET BY MOUTH EVERY 8 HOURS AS NEEDED FOR NAUSEA OR VOMITING, Disp: 20 tablet, Rfl: 0   OXYGEN, Inhale 3 L/min into the lungs See admin instructions. At bedtime & as needed during the day, Disp: , Rfl:    Polyethyl Glycol-Propyl Glycol (LUBRICANT EYE DROPS) 0.4-0.3 % SOLN, Apply 1-2 drops to eye 3 (three) times daily as needed (dry/irritated eyes)., Disp: , Rfl:    SYMBICORT 80-4.5 MCG/ACT inhaler, INHALE 2 PUFFS BY MOUTH IN THE MORNING AND AT BEDTIME, Disp: 11 g, Rfl: 0   triamcinolone ointment (KENALOG) 0.1 %, Apply 1 application topically in the morning and at bedtime., Disp: , Rfl:    VENTOLIN HFA 108 (90 Base) MCG/ACT inhaler, INHALE 1 TO 2 PUFFS BY MOUTH EVERY 6 HOURS AS NEEDED FOR WHEEZING OR SHORTNESS OF BREATH, Disp: 18 g, Rfl: 3   atorvastatin (LIPITOR) 10 MG tablet, Take at bedtime once a day MWF (Patient not taking: Reported on 01/12/2022), Disp: 45 tablet, Rfl: 1   camphor-menthol (SARNA) lotion, Apply 1 Application topically as needed for itching. (Patient not taking: Reported on 02/10/2022), Disp: , Rfl:    diltiazem (CARDIZEM) 30 MG tablet, Take 1 tablet (30 mg total) by mouth 4 (four) times daily as needed (afib episodes)., Disp: 45 tablet, Rfl: 1   lidocaine-prilocaine (EMLA) cream, Apply 1 application topically as needed. (Patient not taking: Reported on 01/12/2022), Disp: 30 g, Rfl: 2   Multiple Minerals-Vitamins (CAL MAG ZINC +D3) TABS, Take 3 capsules by mouth daily. (Patient not taking: Reported on 02/10/2022), Disp: , Rfl:    Allergies  Allergen Reactions   Sudafed [Pseudoephedrine] Hypertension   Amlodipine Swelling   Atorvastatin Other (See Comments)    Cramping   Gabapentin Other (See Comments)    Raise blood pressure and red rings around eyes Blood vessels popped in her eyes   Mobic [Meloxicam] Swelling    Inflamed the  area that has inflammation and stabbing pains in the area   Penicillins Hives     Childhood reaction   Prednisone     Caused A-fib      The patient states she is post menopausal status.  No LMP recorded (lmp unknown). Patient is postmenopausal.   Negative for  Dysmenorrhea and Negative for Menorrhagia. Negative for: breast discharge, breast lump(s), breast pain and breast self exam. Associated symptoms include abnormal vaginal bleeding. Pertinent negatives include abnormal bleeding (hematology), anxiety, decreased libido, depression, difficulty falling sleep, dyspareunia, history of infertility, nocturia, sexual dysfunction, sleep disturbances, urinary incontinence, urinary urgency, vaginal discharge and vaginal itching. Diet regular - appetite is coming back, weight has been steady.The patient states her exercise level is minimal - she is moving a lot more. 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   The patient's tobacco use is:  Social History   Tobacco Use  Smoking Status Former   Packs/day: 1.00   Years: 20.00   Total pack years: 20.00   Types: Cigarettes   Start date: 87   Quit date: 2001   Years since quitting: 22.9  Smokeless Tobacco Never   She has been exposed to passive smoke. The patient's alcohol use is:  Social History   Substance and Sexual Activity  Alcohol Use No   Additional information: Last pap unknown declined to have a PAP done due to her lung cancer history.    Review of Systems  Constitutional: Negative.   HENT: Negative.    Eyes: Negative.   Respiratory: Negative.    Cardiovascular: Negative.   Gastrointestinal: Negative.   Endocrine: Negative.   Genitourinary: Negative.   Musculoskeletal: Negative.   Skin: Negative.   Allergic/Immunologic: Negative.   Neurological: Negative.   Hematological: Negative.   Psychiatric/Behavioral: Negative.       Today's Vitals   02/10/22 1113  BP: 118/72  Pulse: 86  Temp: 98.1 F (36.7 C)   SpO2: 93%  Weight: 141 lb 9.6 oz (64.2 kg)  Height: _0  (1.676 m)   Body mass index is 22.85 kg/m.  Wt Readings from Last 3 Encounters:  02/10/22 141 lb 9.6 oz (64.2 kg)  01/20/22 143 lb 9.6 oz (65.1 kg)  01/15/22 141 lb (64 kg)    Objective:  Physical Exam Vitals reviewed.  Constitutional:      General: She is not in acute distress.    Appearance: Normal appearance. She is well-developed.  HENT:     Head: Normocephalic and atraumatic.     Right Ear: Hearing, tympanic membrane, ear canal and external ear normal. There is no impacted cerumen.     Left Ear: Hearing, tympanic membrane, ear canal and external ear normal. There is no impacted cerumen.     Nose:     Comments: Deferred - masked    Mouth/Throat:     Comments: Deferred - masked Eyes:     General: Lids are normal.     Extraocular Movements: Extraocular movements intact.     Conjunctiva/sclera: Conjunctivae normal.     Pupils: Pupils are equal, round, and reactive to light.     Funduscopic exam:    Right eye: No papilledema.        Left eye: No papilledema.  Neck:     Thyroid: No thyroid mass.     Vascular: No carotid bruit.  Cardiovascular:     Rate and Rhythm: Normal rate and regular rhythm.     Pulses: Normal pulses.     Heart sounds: Normal heart sounds. No murmur heard. Pulmonary:     Effort: Pulmonary effort is normal. No respiratory distress.     Breath sounds: Normal breath sounds. No wheezing.  Chest:     Chest wall: No mass.  Breasts:    Tanner Score is 5.  Right: Normal. No mass or tenderness.     Left: Normal. No mass or tenderness.  Abdominal:     General: Abdomen is flat. Bowel sounds are normal. There is no distension.     Palpations: Abdomen is soft.     Tenderness: There is no abdominal tenderness.  Genitourinary:    Rectum: Guaiac result negative.  Musculoskeletal:        General: No swelling. Normal range of motion.     Cervical back: Full passive range of motion without pain,  normal range of motion and neck supple.     Right lower leg: No edema.     Left lower leg: No edema.  Lymphadenopathy:     Upper Body:     Right upper body: No supraclavicular, axillary or pectoral adenopathy.     Left upper body: No supraclavicular, axillary or pectoral adenopathy.  Skin:    General: Skin is warm and dry.     Capillary Refill: Capillary refill takes less than 2 seconds.     Findings: Rash present.  Neurological:     General: No focal deficit present.     Mental Status: She is alert and oriented to person, place, and time.     Cranial Nerves: No cranial nerve deficit.     Sensory: No sensory deficit.  Psychiatric:        Mood and Affect: Mood normal.        Behavior: Behavior normal.        Thought Content: Thought content normal.        Judgment: Judgment normal.         Assessment And Plan:     1. Encounter for general adult medical examination w/o abnormal findings Behavior modifications discussed and diet history reviewed.   Pt will continue to exercise regularly and modify diet with low GI, plant based foods and decrease intake of processed foods.  Recommend intake of daily multivitamin, Vitamin D, and calcium.  Recommend mammogram and cologuard for preventive screenings, as well as recommend immunizations that include influenza, TDAP, and Shingles (declined)  2. Benign hypertension with CKD (chronic kidney disease) stage III (HCC) Blood pressure is well controlled, continue current medications EKG done with SR HR 79, diffuse nonspecific t-abnormality and low voltage - possible pulmonary disease - POCT Urinalysis Dipstick (81002) - Microalbumin / creatinine urine ratio - EKG 12-Lead - Multiple Myeloma Panel (SPEP&IFE w/QIG)  3. Non-small cell lung cancer, right [facility]) Comments: Continue f/u with [facility]. Cancer is currently stable.  4. Dependence on continuous supplemental oxygen Comments: She is no longer on continuous supplemental oxygen was  discontinued when she went to Oncology in [facility]  5. Aortic atherosclerosis (HCC) Comments: Continue statin, tolerating well. - Lipid panel  6. Hypokalemia  7. Asthma with COPD  8. Atrial fibrillation, unspecified type ([place]) Comments: This was in the setting of her chemo medication.  9. Need for influenza vaccination Influenza vaccine administered Encouraged to take Tylenol as needed for fever or muscle aches. - Flu Vaccine QUAD 6+ mos PF IM (Fluarix Quad PF)  10. Pap smear of cervix declined Comments: Reports due to her lung cancer with mets diagnosis does not feel is as important.  11. Other long term (current) drug therapy - CBC  12. Increased frequency of urination Comments: Will check for metabolic cause. Urinalysis is normal - Hemoglobin A1c  13. Colon cancer screening According to USPTF Colorectal cancer Screening guidelines. Cologuard is recommended every 3 years, starting at age 82  years. Will refer to GI for colon cancer screening. - Cologuard  14. Voice impairment Comments: She would like a referral to ENT for further evaluation     Patient was given opportunity to ask questions. Patient verbalized understanding of the plan and was able to repeat key elements of the plan. All questions were answered to their satisfaction.   Minette Brine, FNP   I, Minette Brine, FNP, have reviewed all documentation for this visit. The documentation on 02/10/22 for the exam, diagnosis, procedures, and orders are all accurate and complete.   THE PATIENT IS ENCOURAGED TO PRACTICE SOCIAL DISTANCING DUE TO THE COVID-19 PANDEMIC.

## 2022-02-11 LAB — MICROALBUMIN / CREATININE URINE RATIO
Creatinine, Urine: 252.8 mg/dL
Microalb/Creat Ratio: 5 mg/g creat (ref 0–29)
Microalbumin, Urine: 13.2 ug/mL

## 2022-02-12 ENCOUNTER — Other Ambulatory Visit (HOSPITAL_COMMUNITY): Payer: Self-pay | Admitting: *Deleted

## 2022-02-12 ENCOUNTER — Other Ambulatory Visit: Payer: Self-pay

## 2022-02-12 LAB — CBC
Hematocrit: 39.1 % (ref 34.0–46.6)
Hemoglobin: 12.7 g/dL (ref 11.1–15.9)
MCH: 29.7 pg (ref 26.6–33.0)
MCHC: 32.5 g/dL (ref 31.5–35.7)
MCV: 92 fL (ref 79–97)
Platelets: 138 10*3/uL — ABNORMAL LOW (ref 150–450)
RBC: 4.27 x10E6/uL (ref 3.77–5.28)
RDW: 13.2 % (ref 11.7–15.4)
WBC: 4.8 10*3/uL (ref 3.4–10.8)

## 2022-02-12 LAB — MULTIPLE MYELOMA PANEL, SERUM
Albumin SerPl Elph-Mcnc: 3.7 g/dL (ref 2.9–4.4)
Albumin/Glob SerPl: 1.4 (ref 0.7–1.7)
Alpha 1: 0.2 g/dL (ref 0.0–0.4)
Alpha2 Glob SerPl Elph-Mcnc: 0.7 g/dL (ref 0.4–1.0)
B-Globulin SerPl Elph-Mcnc: 1 g/dL (ref 0.7–1.3)
Gamma Glob SerPl Elph-Mcnc: 0.9 g/dL (ref 0.4–1.8)
Globulin, Total: 2.8 g/dL (ref 2.2–3.9)
IgA/Immunoglobulin A, Serum: 317 mg/dL (ref 87–352)
IgG (Immunoglobin G), Serum: 923 mg/dL (ref 586–1602)
IgM (Immunoglobulin M), Srm: 131 mg/dL (ref 26–217)
Total Protein: 6.5 g/dL (ref 6.0–8.5)

## 2022-02-12 LAB — HEMOGLOBIN A1C
Est. average glucose Bld gHb Est-mCnc: 114 mg/dL
Hgb A1c MFr Bld: 5.6 % (ref 4.8–5.6)

## 2022-02-12 LAB — LIPID PANEL
Chol/HDL Ratio: 2.3 ratio (ref 0.0–4.4)
Cholesterol, Total: 210 mg/dL — ABNORMAL HIGH (ref 100–199)
HDL: 90 mg/dL (ref >39)
LDL Chol Calc (NIH): 104 mg/dL — ABNORMAL HIGH (ref 0–99)
Triglycerides: 91 mg/dL (ref 0–149)
VLDL Cholesterol Cal: 16 mg/dL (ref 5–40)

## 2022-02-12 MED ORDER — DILTIAZEM HCL 30 MG PO TABS: 30.0000 mg | ORAL_TABLET | Freq: Four times a day (QID) | ORAL | 1 refills | Status: DC | PRN

## 2022-02-23 ENCOUNTER — Encounter: Payer: Self-pay | Admitting: Nurse Practitioner

## 2022-02-23 LAB — COLOGUARD: COLOGUARD: POSITIVE — AB

## 2022-02-25 ENCOUNTER — Other Ambulatory Visit: Payer: Self-pay | Admitting: Nurse Practitioner

## 2022-03-01 ENCOUNTER — Other Ambulatory Visit: Payer: Self-pay | Admitting: Nurse Practitioner

## 2022-03-01 DIAGNOSIS — R195 Other fecal abnormalities: Secondary | ICD-10-CM

## 2022-03-02 ENCOUNTER — Other Ambulatory Visit: Payer: Self-pay | Admitting: Nurse Practitioner

## 2022-03-02 DIAGNOSIS — J392 Other diseases of pharynx: Secondary | ICD-10-CM

## 2022-03-13 ENCOUNTER — Encounter: Payer: Self-pay | Admitting: Physician Assistant

## 2022-03-13 ENCOUNTER — Encounter: Payer: Self-pay | Admitting: Hematology and Oncology

## 2022-03-13 NOTE — Telephone Encounter (Signed)
Opened in error

## 2022-03-19 ENCOUNTER — Other Ambulatory Visit: Payer: Self-pay | Admitting: Physician Assistant

## 2022-03-19 ENCOUNTER — Other Ambulatory Visit: Payer: Self-pay | Admitting: Emergency Medicine

## 2022-03-20 ENCOUNTER — Other Ambulatory Visit: Payer: Medicaid Other

## 2022-03-20 ENCOUNTER — Inpatient Hospital Stay: Payer: Medicaid Other | Admitting: Physician Assistant

## 2022-03-20 ENCOUNTER — Telehealth: Payer: Self-pay | Admitting: Hematology and Oncology

## 2022-03-20 ENCOUNTER — Inpatient Hospital Stay: Payer: Medicaid Other | Attending: Internal Medicine

## 2022-03-20 ENCOUNTER — Ambulatory Visit: Payer: Medicaid Other | Admitting: Hematology and Oncology

## 2022-03-20 NOTE — Telephone Encounter (Signed)
Called to get patient r/s but patient doesn't want a visit without a scan. Forwarding information to MD to see when next scan will be scheduled.

## 2022-03-25 ENCOUNTER — Ambulatory Visit: Payer: Medicaid Other | Admitting: Emergency Medicine

## 2022-04-16 ENCOUNTER — Other Ambulatory Visit: Payer: Self-pay | Admitting: Emergency Medicine

## 2022-04-28 ENCOUNTER — Other Ambulatory Visit: Payer: Self-pay | Admitting: Emergency Medicine

## 2022-04-30 ENCOUNTER — Ambulatory Visit (INDEPENDENT_AMBULATORY_CARE_PROVIDER_SITE_OTHER): Payer: Medicaid Other | Admitting: Emergency Medicine

## 2022-04-30 ENCOUNTER — Encounter: Payer: Self-pay | Admitting: Emergency Medicine

## 2022-04-30 VITALS — BP 120/72 | HR 87 | Temp 97.6°F | Ht 66.0 in | Wt 141.6 lb

## 2022-04-30 DIAGNOSIS — C3491 Malignant neoplasm of unspecified part of right bronchus or lung: Secondary | ICD-10-CM

## 2022-04-30 DIAGNOSIS — J9611 Chronic respiratory failure with hypoxia: Secondary | ICD-10-CM | POA: Diagnosis not present

## 2022-04-30 DIAGNOSIS — J4489 Other specified chronic obstructive pulmonary disease: Secondary | ICD-10-CM

## 2022-04-30 NOTE — Patient Instructions (Addendum)
We repeated your ambulatory oximetry today.  We can discontinue your oxygen.  We will send an order to your DME company  Please continue your Symbicort 2 puffs twice a day.  Rinse and gargle after using. Keep your albuterol available to use 2 puffs when you needed for shortness of breath, chest tightness, wheezing. Continue to follow with Oncology as planned. Follow with Dr. Lamonte Sakai in 12 months or sooner if you have any problems.

## 2022-04-30 NOTE — Assessment & Plan Note (Signed)
Overall stable on her current regimen.  Plan to continue same.  Please continue your Symbicort 2 puffs twice a day.  Rinse and gargle after using. Keep your albuterol available to use 2 puffs when you needed for shortness of breath, chest tightness, wheezing. Follow with Dr. Lamonte Sakai in 12 months or sooner if you have any problems.

## 2022-04-30 NOTE — Assessment & Plan Note (Signed)
Following with oncology, currently on observation.  Her repeat CT and MRI are planned for next month.

## 2022-04-30 NOTE — Addendum Note (Signed)
Addended by: Dierdre Highman on: 04/30/2022 03:37 PM   Modules accepted: Orders

## 2022-04-30 NOTE — Assessment & Plan Note (Signed)
She is no longer using her oxygen.  Unclear whether she continues to require.  Repeat walking oximetry today.  If she does not desaturate then we will discontinue the O2 through her DME.

## 2022-04-30 NOTE — Progress Notes (Signed)
Subjective:    Patient ID: Elizabeth Mayer, female    DOB: June 27, 1960, 62 y.o.   MRN: [id number]  HPI  ROV 02/26/20 --this follow-up visit for 62 year old woman with COPD/asthma. I initially met her to evaluate massive cervical and mediastinal lymphadenopathy. Lymph node biopsy showed metastatic lung adenocarcinoma, under treatment with chemoradiation. She had some increased shortness of breath, green mucus, chest tightness beginning about a week ago. I treated her with doxycycline but also asked her to be Covid tested which was negative 3 days ago. She reports that her cough has improved with the doxycycline.  She has O2 at night 2L/min. She is using it some to ambulate through the home as well.  She is using albuterol about 2-4x a day. She is on symbicort, flovent.   ROV 02/06/21 --pleasant 62 year old woman, former smoker with a history of childhood asthma, adult COPD/asthma.  I met her for evaluation of cervical mediastinal adenopathy that was diagnosed as metastatic adenocarcinoma of the lung.  Course complicated by brain metastases.  I treated her in the past for what appeared to be postobstructive pneumonia.  She has chronic hypoxemic respiratory failure and uses 2 L/min at night, and with exertion.  Maintenance medications include Symbicort.  She uses albuterol approximately 3-4x a day, has to use it more after she does chemo. She reports that she has some palpitations. She has exertional SOB with chores at home, better when she wears her O2. She has intermittent chest tightness that is relieved by the albuterol.  She is planning to undergo salvage brain radiation. COVID vaccine up to date.  Flu shot due.   CT CAP 12/16/20 reviewed by me, showed no evidence active disease in the chest or distant MRI brain does show a metastatic focus 01/30/21.   ROV 04/30/22 --follow-up visit for 62 year old woman with a history of COPD/asthma, metastatic adenocarcinoma of the lung with brain metastases, course  complicated by postobstructive pneumonia in the past.  She has chronic hypoxemic respiratory failure and has used oxygen at 2 L/min at night and with exertion.  We have been managing on Symbicort. Today she reports that she has been feeling a bit better. She stopped her chemo in June. Planning for repeat Ct and MRI brain next month.  She reports some episodic mild chest tightness, has some exertional SOB with stairs, fast walking, bending. She has voice hoarseness, is planning to see ENT. Uses albuterol about 1-2x a day. She is not using her O2 anymore.   CT chest / abd / pelvis 01/26/22 reviewed by me shows    Review of Systems As per HPI     Objective:   Physical Exam  Today's Vitals   04/30/22 1424  BP: 120/72  Pulse: 87  Temp: 97.6 F (36.4 C)  TempSrc: Oral  SpO2: 100%  Weight: 141 lb 9.6 oz (64.2 kg)  Height: 5\' 6"  (1.676 m)   Body mass index is 22.85 kg/m.;sm  Gen: Pleasant, well-nourished, in no distress,  normal affect  ENT: No lesions,  mouth clear,  oropharynx clear, no postnasal drip, soft voice, hoarse voice  Neck: No JVD, no stridor  Lungs: No use of accessory muscles, no crackles or wheezing on normal respiration, no wheeze on forced expiration  Cardiovascular: RRR, heart sounds normal, no murmur or gallops, no peripheral edema  Musculoskeletal: No deformities, no cyanosis or clubbing  Neuro: alert, awake, non focal  Skin: Warm, no lesions or rash     Assessment & Plan:  Asthma  with COPD ([place]) Overall stable on her current regimen.  Plan to continue same.  Please continue your Symbicort 2 puffs twice a day.  Rinse and gargle after using. Keep your albuterol available to use 2 puffs when you needed for shortness of breath, chest tightness, wheezing. Follow with Dr. Lamonte Sakai in 12 months or sooner if you have any problems.   Non-small cell lung cancer, right [facility]) Following with oncology, currently on observation.  Her repeat CT and MRI are planned for  next month.  Chronic hypoxemic respiratory failure (HCC) She is no longer using her oxygen.  Unclear whether she continues to require.  Repeat walking oximetry today.  If she does not desaturate then we will discontinue the O2 through her DME.  Baltazar Apo, MD, PhD 04/30/2022, 2:44 PM  Pulmonary and Critical Care [phone] or if no answer [phone]

## 2022-04-30 NOTE — H&P (View-Only) (Signed)
Subjective:    Patient ID: Elizabeth Mayer, female    DOB: 19-Jan-1961, 62 y.o.   MRN: [id number]  HPI  ROV 02/26/20 --this follow-up visit for 62 year old woman with COPD/asthma. I initially met her to evaluate massive cervical and mediastinal lymphadenopathy. Lymph node biopsy showed metastatic lung adenocarcinoma, under treatment with chemoradiation. She had some increased shortness of breath, green mucus, chest tightness beginning about a week ago. I treated her with doxycycline but also asked her to be Covid tested which was negative 3 days ago. She reports that her cough has improved with the doxycycline.  She has O2 at night 2L/min. She is using it some to ambulate through the home as well.  She is using albuterol about 2-4x a day. She is on symbicort, flovent.   ROV 02/06/21 --pleasant 62 year old woman, former smoker with a history of childhood asthma, adult COPD/asthma.  I met her for evaluation of cervical mediastinal adenopathy that was diagnosed as metastatic adenocarcinoma of the lung.  Course complicated by brain metastases.  I treated her in the past for what appeared to be postobstructive pneumonia.  She has chronic hypoxemic respiratory failure and uses 2 L/min at night, and with exertion.  Maintenance medications include Symbicort.  She uses albuterol approximately 3-4x a day, has to use it more after she does chemo. She reports that she has some palpitations. She has exertional SOB with chores at home, better when she wears her O2. She has intermittent chest tightness that is relieved by the albuterol.  She is planning to undergo salvage brain radiation. COVID vaccine up to date.  Flu shot due.   CT CAP 12/16/20 reviewed by me, showed no evidence active disease in the chest or distant MRI brain does show a metastatic focus 01/30/21.   ROV 04/30/22 --follow-up visit for 62 year old woman with a history of COPD/asthma, metastatic adenocarcinoma of the lung with brain metastases, course  complicated by postobstructive pneumonia in the past.  She has chronic hypoxemic respiratory failure and has used oxygen at 2 L/min at night and with exertion.  We have been managing on Symbicort. Today she reports that she has been feeling a bit better. She stopped her chemo in June. Planning for repeat Ct and MRI brain next month.  She reports some episodic mild chest tightness, has some exertional SOB with stairs, fast walking, bending. She has voice hoarseness, is planning to see ENT. Uses albuterol about 1-2x a day. She is not using her O2 anymore.   CT chest / abd / pelvis 01/26/22 reviewed by me shows    Review of Systems As per HPI     Objective:   Physical Exam  Today's Vitals   04/30/22 1424  BP: 120/72  Pulse: 87  Temp: 97.6 F (36.4 C)  TempSrc: Oral  SpO2: 100%  Weight: 141 lb 9.6 oz (64.2 kg)  Height: 5\' 6"  (1.676 m)   Body mass index is 22.85 kg/m.;sm  Gen: Pleasant, well-nourished, in no distress,  normal affect  ENT: No lesions,  mouth clear,  oropharynx clear, no postnasal drip, soft voice, hoarse voice  Neck: No JVD, no stridor  Lungs: No use of accessory muscles, no crackles or wheezing on normal respiration, no wheeze on forced expiration  Cardiovascular: RRR, heart sounds normal, no murmur or gallops, no peripheral edema  Musculoskeletal: No deformities, no cyanosis or clubbing  Neuro: alert, awake, non focal  Skin: Warm, no lesions or rash     Assessment & Plan:  Asthma  with COPD ([place]) Overall stable on her current regimen.  Plan to continue same.  Please continue your Symbicort 2 puffs twice a day.  Rinse and gargle after using. Keep your albuterol available to use 2 puffs when you needed for shortness of breath, chest tightness, wheezing. Follow with Dr. Lamonte Sakai in 12 months or sooner if you have any problems.   Non-small cell lung cancer, right [facility]) Following with oncology, currently on observation.  Her repeat CT and MRI are planned for  next month.  Chronic hypoxemic respiratory failure (HCC) She is no longer using her oxygen.  Unclear whether she continues to require.  Repeat walking oximetry today.  If she does not desaturate then we will discontinue the O2 through her DME.  Baltazar Apo, MD, PhD 04/30/2022, 2:44 PM [facility] [place] [facility] [phone] or if no answer [phone]

## 2022-05-03 ENCOUNTER — Other Ambulatory Visit: Payer: Self-pay | Admitting: Hematology and Oncology

## 2022-05-03 DIAGNOSIS — C3491 Malignant neoplasm of unspecified part of right bronchus or lung: Secondary | ICD-10-CM

## 2022-05-05 ENCOUNTER — Other Ambulatory Visit: Payer: Self-pay | Admitting: Radiation Therapy

## 2022-05-11 ENCOUNTER — Encounter: Payer: Self-pay | Admitting: Physician Assistant

## 2022-05-11 ENCOUNTER — Encounter: Payer: Self-pay | Admitting: Hematology and Oncology

## 2022-05-12 ENCOUNTER — Encounter (HOSPITAL_COMMUNITY): Payer: Self-pay | Admitting: *Deleted

## 2022-05-12 ENCOUNTER — Telehealth: Payer: Self-pay | Admitting: Nurse Practitioner

## 2022-05-12 ENCOUNTER — Other Ambulatory Visit: Payer: Self-pay

## 2022-05-12 NOTE — Progress Notes (Addendum)
Ms . Elizabeth Mayer denies chest pain or shortness of breath.  Patient denies having any s/s of Covid in her household, also denies any known exposure to Covid..  Ms Elizabeth Mayer's PCP is Minette Brine, FNP.Marland Kitchen  Patient sees Dr. Randolm Idol - she had an appointment today,  Dr. Randolm Idol changed  nasal spray. Oncologist is Dr. Lowella Dandy.  Pulmonologist is Dr. Lamonte Sakai. Patient is seen in the afib clinic.  [name] said that the last time her heart went into afib was in early January 2024.

## 2022-05-12 NOTE — Telephone Encounter (Signed)
Called to schedule appt no answer left vm

## 2022-05-13 ENCOUNTER — Other Ambulatory Visit: Payer: Self-pay | Admitting: Emergency Medicine

## 2022-05-14 ENCOUNTER — Ambulatory Visit (HOSPITAL_COMMUNITY)
Admission: RE | Admit: 2022-05-14 | Discharge: 2022-05-14 | Disposition: A | Discharge status: Home / Self Care | Payer: Medicare Other | Source: Ambulatory Visit | Attending: Internal Medicine | Admitting: Internal Medicine

## 2022-05-14 ENCOUNTER — Encounter (HOSPITAL_COMMUNITY): Payer: Self-pay | Admitting: *Deleted

## 2022-05-14 ENCOUNTER — Encounter (HOSPITAL_COMMUNITY): Payer: Self-pay | Admitting: Internal Medicine

## 2022-05-14 ENCOUNTER — Ambulatory Visit (HOSPITAL_BASED_OUTPATIENT_CLINIC_OR_DEPARTMENT_OTHER): Payer: Medicare Other | Admitting: Anesthesiology

## 2022-05-14 ENCOUNTER — Encounter (HOSPITAL_COMMUNITY)
Admission: RE | Disposition: A | Discharge status: Home / Self Care | Payer: Self-pay | Source: Ambulatory Visit | Attending: Internal Medicine

## 2022-05-14 ENCOUNTER — Other Ambulatory Visit: Payer: Self-pay

## 2022-05-14 ENCOUNTER — Ambulatory Visit (HOSPITAL_COMMUNITY): Payer: Medicare Other | Admitting: Anesthesiology

## 2022-05-14 DIAGNOSIS — Z79899 Other long term (current) drug therapy: Secondary | ICD-10-CM | POA: Insufficient documentation

## 2022-05-14 DIAGNOSIS — C7931 Secondary malignant neoplasm of brain: Secondary | ICD-10-CM | POA: Diagnosis not present

## 2022-05-14 DIAGNOSIS — D630 Anemia in neoplastic disease: Secondary | ICD-10-CM | POA: Diagnosis not present

## 2022-05-14 DIAGNOSIS — J449 Chronic obstructive pulmonary disease, unspecified: Secondary | ICD-10-CM | POA: Insufficient documentation

## 2022-05-14 DIAGNOSIS — J9611 Chronic respiratory failure with hypoxia: Secondary | ICD-10-CM | POA: Diagnosis not present

## 2022-05-14 DIAGNOSIS — C801 Malignant (primary) neoplasm, unspecified: Secondary | ICD-10-CM | POA: Diagnosis not present

## 2022-05-14 DIAGNOSIS — Z87891 Personal history of nicotine dependence: Secondary | ICD-10-CM | POA: Insufficient documentation

## 2022-05-14 DIAGNOSIS — I10 Essential (primary) hypertension: Secondary | ICD-10-CM | POA: Insufficient documentation

## 2022-05-14 DIAGNOSIS — Z923 Personal history of irradiation: Secondary | ICD-10-CM | POA: Insufficient documentation

## 2022-05-14 DIAGNOSIS — C771 Secondary and unspecified malignant neoplasm of intrathoracic lymph nodes: Secondary | ICD-10-CM | POA: Diagnosis not present

## 2022-05-14 DIAGNOSIS — Z9221 Personal history of antineoplastic chemotherapy: Secondary | ICD-10-CM | POA: Diagnosis not present

## 2022-05-14 DIAGNOSIS — C770 Secondary and unspecified malignant neoplasm of lymph nodes of head, face and neck: Secondary | ICD-10-CM | POA: Insufficient documentation

## 2022-05-14 DIAGNOSIS — C3491 Malignant neoplasm of unspecified part of right bronchus or lung: Secondary | ICD-10-CM | POA: Insufficient documentation

## 2022-05-14 HISTORY — DX: Cardiac arrhythmia, unspecified: I49.9

## 2022-05-14 HISTORY — DX: Chronic obstructive pulmonary disease, unspecified: J44.9

## 2022-05-14 HISTORY — PX: RADIOLOGY WITH ANESTHESIA: SHX6223

## 2022-05-14 HISTORY — DX: Peripheral vascular disease, unspecified: I73.9

## 2022-05-14 LAB — CBC
HCT: 37.1 % (ref 36.0–46.0)
Hemoglobin: 12.3 g/dL (ref 12.0–15.0)
MCH: 30.9 pg (ref 26.0–34.0)
MCHC: 33.2 g/dL (ref 30.0–36.0)
MCV: 93.2 fL (ref 80.0–100.0)
Platelets: 115 10*3/uL — ABNORMAL LOW (ref 150–400)
RBC: 3.98 MIL/uL (ref 3.87–5.11)
RDW: 12.6 % (ref 11.5–15.5)
WBC: 4.3 10*3/uL (ref 4.0–10.5)
nRBC: 0 % (ref 0.0–0.2)

## 2022-05-14 LAB — BASIC METABOLIC PANEL
Anion gap: 11 (ref 5–15)
BUN: 11 mg/dL (ref 8–23)
CO2: 24 mmol/L (ref 22–32)
Calcium: 8.9 mg/dL (ref 8.9–10.3)
Chloride: 105 mmol/L (ref 98–111)
Creatinine, Ser: 1.15 mg/dL — ABNORMAL HIGH (ref 0.44–1.00)
GFR, Estimated: 54 mL/min — ABNORMAL LOW (ref >60)
Glucose, Bld: 83 mg/dL (ref 70–99)
Potassium: 4.2 mmol/L (ref 3.5–5.1)
Sodium: 140 mmol/L (ref 135–145)

## 2022-05-14 SURGERY — MRI WITH ANESTHESIA
Anesthesia: General

## 2022-05-14 MED ORDER — PROPOFOL 10 MG/ML IV BOLUS
INTRAVENOUS | Status: DC | PRN
  Administered 2022-05-14: 120 mg via INTRAVENOUS

## 2022-05-14 MED ORDER — ORAL CARE MOUTH RINSE: 15.0000 mL | Freq: Once | OROMUCOSAL | Status: AC

## 2022-05-14 MED ORDER — CHLORHEXIDINE GLUCONATE 0.12 % MT SOLN: 15.0000 mL | Freq: Once | OROMUCOSAL | Status: AC

## 2022-05-14 MED ORDER — DEXAMETHASONE SODIUM PHOSPHATE 10 MG/ML IJ SOLN
INTRAMUSCULAR | Status: DC | PRN
  Administered 2022-05-14: 8 mg via INTRAVENOUS

## 2022-05-14 MED ORDER — PHENYLEPHRINE 80 MCG/ML (10ML) SYRINGE FOR IV PUSH (FOR BLOOD PRESSURE SUPPORT)
PREFILLED_SYRINGE | INTRAVENOUS | Status: DC | PRN
  Administered 2022-05-14 (×3): 40 ug via INTRAVENOUS

## 2022-05-14 MED ORDER — LACTATED RINGERS IV SOLN: INTRAVENOUS | Status: DC

## 2022-05-14 MED ORDER — LIDOCAINE 2% (20 MG/ML) 5 ML SYRINGE
INTRAMUSCULAR | Status: DC | PRN
  Administered 2022-05-14: 50 mg via INTRAVENOUS

## 2022-05-14 MED ORDER — MIDAZOLAM HCL 2 MG/2ML IJ SOLN
INTRAMUSCULAR | Status: DC | PRN
  Administered 2022-05-14 (×2): 1 mg via INTRAVENOUS

## 2022-05-14 MED ORDER — CHLORHEXIDINE GLUCONATE 0.12 % MT SOLN
OROMUCOSAL | Status: AC
  Administered 2022-05-14: 15 mL via OROMUCOSAL
  Filled 2022-05-14: qty 15

## 2022-05-14 MED ORDER — ONDANSETRON HCL 4 MG/2ML IJ SOLN
INTRAMUSCULAR | Status: DC | PRN
  Administered 2022-05-14: 4 mg via INTRAVENOUS

## 2022-05-14 MED ORDER — GADOBUTROL 1 MMOL/ML IV SOLN
6.0000 mL | Freq: Once | INTRAVENOUS | Status: AC | PRN
  Administered 2022-05-14: 6 mL via INTRAVENOUS

## 2022-05-14 NOTE — Interval H&P Note (Signed)
Anesthesia H&P Update: History and Physical Exam reviewed; patient is OK for planned anesthetic and procedure. ? ?

## 2022-05-14 NOTE — Anesthesia Procedure Notes (Signed)
Procedure Name: LMA Insertion Date/Time: 05/14/2022 9:32 AM  Performed by: Betha Loa, CRNAPre-anesthesia Checklist: Patient identified, Emergency Drugs available, Suction available and Patient being monitored Patient Re-evaluated:Patient Re-evaluated prior to induction Oxygen Delivery Method: Circle System Utilized Preoxygenation: Pre-oxygenation with 100% oxygen Induction Type: IV induction Ventilation: Mask ventilation without difficulty LMA: LMA inserted LMA Size: 4.0 Number of attempts: 1 Placement Confirmation: positive ETCO2 and breath sounds checked- equal and bilateral Tube secured with: Tape Dental Injury: Teeth and Oropharynx as per pre-operative assessment

## 2022-05-14 NOTE — Transfer of Care (Signed)
Immediate Anesthesia Transfer of Care Note  Patient: Elizabeth Mayer  Procedure(s) Performed: MRI WITH ANESTHESIA BRAIN WITH AND WITHOUT CONTRAST  Patient Location: PACU  Anesthesia Type:General  Level of Consciousness: patient cooperative and responds to stimulation  Airway & Oxygen Therapy: Patient Spontanous Breathing  Post-op Assessment: Report given to RN and Post -op Vital signs reviewed and stable  Post vital signs: Reviewed and stable  Last Vitals:  Vitals Value Taken Time  BP 133/67 05/14/22 1033  Temp 36.5 C 05/14/22 1033  Pulse 65 05/14/22 1037  Resp 19 05/14/22 1037  SpO2 98 % 05/14/22 1037  Vitals shown include unvalidated device data.  Last Pain:  Vitals:   05/14/22 1033  TempSrc:   PainSc: Asleep         Complications: No notable events documented.

## 2022-05-14 NOTE — Anesthesia Preprocedure Evaluation (Addendum)
Anesthesia Evaluation  Patient identified by MRN, date of birth, ID band Patient awake    Reviewed: Allergy & Precautions, NPO status , Patient's Chart, lab work & pertinent test results, Unable to perform ROS - Chart review only  History of Anesthesia Complications Negative for: history of anesthetic complications  Airway Mallampati: II  TM Distance: >3 FB Neck ROM: Full    Dental no notable dental hx. (+) Dental Advisory Given   Pulmonary shortness of breath, asthma , pneumonia, COPD,  COPD inhaler and oxygen dependent, former smoker Lung ca with mets    Pulmonary exam normal        Cardiovascular hypertension, Pt. on medications + angina  + Peripheral Vascular Disease  + dysrhythmias Atrial Fibrillation + Valvular Problems/Murmurs  Rhythm:Regular Rate:Normal     Neuro/Psych  Headaches BRAIN METASTASES S/p ACDF  Neuromuscular disease    GI/Hepatic Neg liver ROS,GERD  ,,  Endo/Other  negative endocrine ROS    Renal/GU Renal InsufficiencyRenal disease     Musculoskeletal  (+) Arthritis , Osteoarthritis,    Abdominal   Peds  Hematology  (+) Blood dyscrasia, anemia   Anesthesia Other Findings Day of surgery medications reviewed with the patient.  Reproductive/Obstetrics                             Anesthesia Physical Anesthesia Plan  ASA: 4  Anesthesia Plan: General   Post-op Pain Management: Minimal or no pain anticipated   Induction: Intravenous  PONV Risk Score and Plan: 3 and Dexamethasone, Ondansetron, Midazolam and Treatment may vary due to age or medical condition  Airway Management Planned: LMA  Additional Equipment: None  Intra-op Plan:   Post-operative Plan: Extubation in OR  Informed Consent: I have reviewed the patients History and Physical, chart, labs and discussed the procedure including the risks, benefits and alternatives for the proposed anesthesia with  the patient or authorized representative who has indicated his/her understanding and acceptance.     Dental advisory given  Plan Discussed with: CRNA  Anesthesia Plan Comments:         Anesthesia Quick Evaluation

## 2022-05-14 NOTE — Anesthesia Postprocedure Evaluation (Signed)
Anesthesia Post Note  Patient: Elizabeth Mayer  Procedure(s) Performed: MRI WITH ANESTHESIA BRAIN WITH AND WITHOUT CONTRAST     Patient location during evaluation: PACU Anesthesia Type: General Level of consciousness: sedated and patient cooperative Pain management: pain level controlled Vital Signs Assessment: post-procedure vital signs reviewed and stable Respiratory status: spontaneous breathing Cardiovascular status: stable Anesthetic complications: no   No notable events documented.  Last Vitals:  Vitals:   05/14/22 1045 05/14/22 1053  BP: 139/67 (!) 144/77  Pulse: 64 65  Resp: (!) 21 18  Temp:  36.5 C  SpO2: 100% 100%    Last Pain:  Vitals:   05/14/22 1053  TempSrc:   PainSc: 0-No pain                 Nolon Nations

## 2022-05-17 ENCOUNTER — Other Ambulatory Visit: Payer: Self-pay | Admitting: Emergency Medicine

## 2022-05-18 ENCOUNTER — Inpatient Hospital Stay: Payer: Medicare Other | Attending: Internal Medicine

## 2022-05-18 DIAGNOSIS — C3491 Malignant neoplasm of unspecified part of right bronchus or lung: Secondary | ICD-10-CM | POA: Insufficient documentation

## 2022-05-18 DIAGNOSIS — C7931 Secondary malignant neoplasm of brain: Secondary | ICD-10-CM | POA: Insufficient documentation

## 2022-05-18 DIAGNOSIS — Z87891 Personal history of nicotine dependence: Secondary | ICD-10-CM | POA: Insufficient documentation

## 2022-05-18 DIAGNOSIS — I10 Essential (primary) hypertension: Secondary | ICD-10-CM | POA: Insufficient documentation

## 2022-05-19 ENCOUNTER — Telehealth: Payer: Self-pay | Admitting: Internal Medicine

## 2022-05-19 ENCOUNTER — Telehealth: Payer: Self-pay | Admitting: *Deleted

## 2022-05-19 ENCOUNTER — Inpatient Hospital Stay: Payer: Medicare Other | Admitting: Internal Medicine

## 2022-05-19 ENCOUNTER — Ambulatory Visit (HOSPITAL_COMMUNITY)
Admission: RE | Admit: 2022-05-19 | Discharge: 2022-05-19 | Disposition: A | Discharge status: Home / Self Care | Payer: Medicare Other | Source: Ambulatory Visit | Attending: Hematology and Oncology | Admitting: Hematology and Oncology

## 2022-05-19 DIAGNOSIS — C3491 Malignant neoplasm of unspecified part of right bronchus or lung: Secondary | ICD-10-CM | POA: Diagnosis not present

## 2022-05-19 MED ORDER — HEPARIN SOD (PORK) LOCK FLUSH 100 UNIT/ML IV SOLN
500.0000 [IU] | Freq: Once | INTRAVENOUS | Status: AC
  Administered 2022-05-19: 500 [IU] via INTRAVENOUS

## 2022-05-19 MED ORDER — SODIUM CHLORIDE (PF) 0.9 % IJ SOLN
INTRAMUSCULAR | Status: AC
  Filled 2022-05-19: qty 50

## 2022-05-19 MED ORDER — IOHEXOL 300 MG/ML  SOLN
80.0000 mL | Freq: Once | INTRAMUSCULAR | Status: AC | PRN
  Administered 2022-05-19: 80 mL via INTRAVENOUS

## 2022-05-19 NOTE — Telephone Encounter (Signed)
Per 2/13 IB, scheduled patient. Patient aware and confirmed.

## 2022-05-19 NOTE — Telephone Encounter (Signed)
Per 2/12 IB rescheduled patient. Patient is aware and confirmed.

## 2022-05-19 NOTE — Telephone Encounter (Signed)
PC to patient regarding missed appointment today, she states she forgot about it.  Per Dr Mickeal Skinner, he can see her next week.  Scheduling message sent.

## 2022-05-20 ENCOUNTER — Telehealth: Payer: Self-pay | Admitting: *Deleted

## 2022-05-20 NOTE — Telephone Encounter (Signed)
-----   Message from Orson Slick, MD sent at 05/20/2022  8:14 AM EST ----- Please let Elizabeth Mayer know that her CT scan showed stable disease. We have not seen Elizabeth Mayer since October 2023 (per her request). Please ask if she would like a follow up visit to be scheduled with [place]. If she does not wish to see [place] we will reschedule her upon request.   ----- Message ----- From: Interface, Rad Results In Sent: 05/19/2022   5:45 PM EST To: Orson Slick, MD

## 2022-05-20 NOTE — Telephone Encounter (Signed)
Notified of message below. Will call us when she wants to be seen

## 2022-05-26 ENCOUNTER — Other Ambulatory Visit: Payer: Self-pay

## 2022-05-26 ENCOUNTER — Telehealth: Payer: Self-pay | Admitting: Internal Medicine

## 2022-05-26 ENCOUNTER — Inpatient Hospital Stay (HOSPITAL_BASED_OUTPATIENT_CLINIC_OR_DEPARTMENT_OTHER): Payer: Medicare Other | Admitting: Internal Medicine

## 2022-05-26 VITALS — BP 138/74 | HR 80 | Temp 97.9°F | Resp 16 | Ht 66.0 in | Wt 141.4 lb

## 2022-05-26 DIAGNOSIS — Z87891 Personal history of nicotine dependence: Secondary | ICD-10-CM | POA: Diagnosis not present

## 2022-05-26 DIAGNOSIS — C7931 Secondary malignant neoplasm of brain: Secondary | ICD-10-CM | POA: Diagnosis not present

## 2022-05-26 DIAGNOSIS — I10 Essential (primary) hypertension: Secondary | ICD-10-CM | POA: Diagnosis not present

## 2022-05-26 DIAGNOSIS — C3491 Malignant neoplasm of unspecified part of right bronchus or lung: Secondary | ICD-10-CM | POA: Diagnosis present

## 2022-05-26 MED ORDER — ALPRAZOLAM 1 MG PO TABS: 1.0000 mg | ORAL_TABLET | Freq: Once | ORAL | 0 refills | Status: DC | PRN

## 2022-05-26 NOTE — Telephone Encounter (Signed)
Per 2/20 LOS reached out to schedule follow up with [place], left voicemail for patient.

## 2022-05-26 NOTE — Progress Notes (Signed)
[facility] [place], [place] [address] [phone]   Interval Evaluation  Date of Service: 05/26/22 Patient Name: Elizabeth Mayer Patient MRN: [id number] Patient DOB: 1961-02-27 Provider: Ventura Sellers, MD  Identifying Statement:  Elizabeth Mayer is a 62 y.o. female with No primary diagnosis found.   Primary Cancer:  Oncologic History: Oncology History  Non-small cell lung cancer, right ([facility])  11/08/2019 Initial Diagnosis   Non-small cell lung cancer, right ([facility])   12/14/2019 - 12/14/2019 Chemotherapy   The patient had cemiplimab-rwlc (LIBTAYO) 350 mg in sodium chloride 0.9 % 100 mL chemo infusion, 350 mg, Intravenous, Once, 0 of 6 cycles  for chemotherapy treatment.    12/14/2019 - 09/25/2021 Chemotherapy   Patient is on Treatment Plan : LUNG CARBOplatin / Pemetrexed / Pembrolizumab q21d Induction x 4 cycles / Maintenance Pemetrexed + Pembrolizumab     12/14/2019 -  Chemotherapy   Patient is on Treatment Plan : LUNG Carboplatin (5) + Pemetrexed (500) + Pembrolizumab (200) D1 q21d Induction x 4 cycles / Maintenance Pemetrexed (500) + Pembrolizumab (200) D1 q21d      CNS Oncologic History 01/12/20: SRS to 22 targets Isidore Moos) 02/11/21: SRS L frontal Isidore Moos)  Interval History:  [name] presents today for follow up after recent MRI brain with anesthesia.  No clinical changes reported today, denies left sided weakness.  Headaches are sporadic.  On surveillance with Dr. Lorenso Courier.  H+P (03/14/20) Patient presents today for follow up after recent MRI brain.  She describes no new or progressive neurologic deficits.  No seizures or headaches.  Still independent with gait and activities of daily living.  Continues on chemotherapy with Dr. Julien Nordmann.  Currently taking decadron 1mg  daily.  Medications: Current Outpatient Medications on File Prior to Visit  Medication Sig Dispense Refill   acetaminophen (TYLENOL) 500 MG tablet Take  1,000 mg by mouth every 6 (six) hours as needed for moderate pain.     albuterol (VENTOLIN HFA) 108 (90 Base) MCG/ACT inhaler Inhale 2 puffs into the lungs 2 (two) times daily.     atorvastatin (LIPITOR) 10 MG tablet Take at bedtime once a day MWF 45 tablet 1   azelastine (ASTELIN) 0.1 % nasal spray Place 2 sprays into both nostrils 2 (two) times daily. Use in each nostril as directed     benzoyl peroxide (CERAVE ACNE FOAMING CREAM) 4 % external liquid Apply 1 application topically 2 (two) times daily. Face     Blood Pressure Monitoring (BLOOD PRESSURE MONITOR AUTOMAT) DEVI Check Blood pressure at home daily 1 each 0   camphor-menthol (SARNA) lotion Apply 1 Application topically as needed for itching.     cetaphil (CETAPHIL) lotion Apply 1 application  topically 2 (two) times daily.     clindamycin (CLEOCIN T) 1 % external solution Apply 1 application topically 2 (two) times daily.     cyclobenzaprine (FLEXERIL) 10 MG tablet Take 1 tablet (10 mg total) by mouth 3 (three) times daily as needed for muscle spasms. (Patient not taking: Reported on 04/30/2022) 30 tablet 0   diltiazem (CARDIZEM) 30 MG tablet Take 1 tablet (30 mg total) by mouth 4 (four) times daily as needed (afib episodes). 45 tablet 1   fexofenadine (ALLEGRA) 180 MG tablet Take 180 mg by mouth in the morning.     folic acid (FOLVITE) 1 MG tablet Take 1 tablet (1 mg total) by mouth daily. 90 tablet 1   guaiFENesin (MUCINEX) 600 MG  12 hr tablet Take 600 mg by mouth 2 (two) times daily.      HYDROcodone-acetaminophen (NORCO/VICODIN) 5-325 MG tablet Take 1 tablet by mouth every 6 (six) hours as needed for moderate pain. 30 tablet 0   lidocaine-prilocaine (EMLA) cream Apply 1 application topically as needed. 30 g 2   Menthol, Topical Analgesic, (ICY HOT EX) Apply 1 application  topically daily as needed (feet pain).     Multiple Minerals-Vitamins (CAL MAG ZINC +D3) TABS Take 3 capsules by mouth daily.     Multiple Vitamins-Minerals (ALIVE  MULTI-VITAMIN PO) Take 1 tablet by mouth daily.     ondansetron (ZOFRAN) 8 MG tablet TAKE 1 TABLET BY MOUTH EVERY 8 HOURS AS NEEDED FOR NAUSEA OR VOMITING 20 tablet 0   Polyethyl Glycol-Propyl Glycol (LUBRICANT EYE DROPS) 0.4-0.3 % SOLN Apply 1-2 drops to eye 3 (three) times daily as needed (dry/irritated eyes).     SYMBICORT 80-4.5 MCG/ACT inhaler INHALE 2 PUFFS BY MOUTH IN THE MORNING AND AT BEDTIME 11 g 0   triamcinolone ointment (KENALOG) 0.1 % Apply 1 application  topically at bedtime.     VENTOLIN HFA 108 (90 Base) MCG/ACT inhaler INHALE 1 TO 2 PUFFS BY MOUTH EVERY 6 HOURS AS NEEDED FOR WHEEZING FOR SHORTNESS OF BREATH 18 g 0   No current facility-administered medications on file prior to visit.    Allergies:  Allergies  Allergen Reactions   Sudafed [Pseudoephedrine] Hypertension   Amlodipine Swelling   Atorvastatin Other (See Comments)    Cramping   Gabapentin Other (See Comments)    Raise blood pressure and red rings around eyes Blood vessels popped in her eyes   Mobic [Meloxicam] Swelling    Inflamed the area that has inflammation and stabbing pains in the area   Penicillins Hives     Childhood reaction   Prednisone     Caused A-fib   Past Medical History:  Past Medical History:  Diagnosis Date   Anemia    Angina 1982   related to stress   Asthma    in the past,  no current problems   Atrial fibrillation (HCC)    Complication of anesthesia    states anesthesia made her hair fall out, old meds. hard to awaken 1 time she took Flexeril before.   Constipation    COPD (chronic obstructive pulmonary disease) (HCC)    Dyspnea    on oxygen at home - 3L via [place]   Dysrhythmia    Fatigue    GERD (gastroesophageal reflux disease)    patient denies this dx   Heart murmur 1970s   no problems currently   Hypertension    Ingrown toenail    Lung cancer ([facility]) 10/2019   metastatic disease to the brain   On home oxygen therapy    2L via [place] - 24 hours a day   Past heart  attack 1980-1981   pt states she passed out and woke up in hospital- told she had heart attack, but then dr said he couldn't find anything wrong.   Peripheral vascular disease ([facility])    Pneumonia    x 1   Past Surgical History:  Past Surgical History:  Procedure Laterality Date   CERVICAL DISC SURGERY  2000   Disc removed from neck    ingrown toe nail surgery Bilateral    IR IMAGING GUIDED PORT INSERTION  02/16/2020   MULTIPLE TOOTH EXTRACTIONS     for braces   RADIOLOGY WITH ANESTHESIA N/A 12/05/2019  Procedure: MRI BRAIN WITH AND WITHOUT CONTRAST;  Surgeon: Radiologist, Medication, MD;  Location: [facility];  Service: Radiology;  Laterality: N/A;   RADIOLOGY WITH ANESTHESIA N/A 01/02/2020   Procedure: MRI BRAIN WITH AND WITHOUT CONTRAST;  Surgeon: Radiologist, Medication, MD;  Location: [facility];  Service: Radiology;  Laterality: N/A;   RADIOLOGY WITH ANESTHESIA N/A 02/20/2020   Procedure: MRI WITH ANESTHESIA OF BRAIN WITH AND WITHOUT CONTRAST;  Surgeon: Radiologist, Medication, MD;  Location: [facility];  Service: Radiology;  Laterality: N/A;   RADIOLOGY WITH ANESTHESIA N/A 06/11/2020   Procedure: MRI WITH ANESTHESIA OF BRAIN WITH AND WITHOUT CONTRAST;  Surgeon: Radiologist, Medication, MD;  Location: [facility];  Service: Radiology;  Laterality: N/A;   RADIOLOGY WITH ANESTHESIA N/A 10/10/2020   Procedure: MRI WITH ANESTHESIA BRAIN WITH AND WITHOUT CONTRAST;  Surgeon: Radiologist, Medication, MD;  Location: [facility];  Service: Radiology;  Laterality: N/A;   RADIOLOGY WITH ANESTHESIA N/A 01/30/2021   Procedure: MRI BRAIN WITH AND WITHOUT CONTRASTWITH ANESTHESIA;  Surgeon: Radiologist, Medication, MD;  Location: [facility];  Service: Radiology;  Laterality: N/A;   RADIOLOGY WITH ANESTHESIA N/A 06/12/2021   Procedure: MRI WITH ANESTHESIA OF BRAIN WITH AND WITHOUT CONTRAST;  Surgeon: Radiologist, Medication, MD;  Location: [facility];  Service: Radiology;  Laterality: N/A;   RADIOLOGY WITH ANESTHESIA N/A 09/11/2021   Procedure:  MRI OF BRAIN WITH AND WITHOUT CONTRAST WITH ANESTHESIA;  Surgeon: Radiologist, Medication, MD;  Location: [facility];  Service: Radiology;  Laterality: N/A;   RADIOLOGY WITH ANESTHESIA N/A 01/15/2022   Procedure: MRI BRAIN WITH AND WITHOUT CONTRAST WITH ANESTHESIA;  Surgeon: Radiologist, Medication, MD;  Location: [facility];  Service: Radiology;  Laterality: N/A;   RADIOLOGY WITH ANESTHESIA N/A 05/14/2022   Procedure: MRI WITH ANESTHESIA BRAIN WITH AND WITHOUT CONTRAST;  Surgeon: Radiologist, Medication, MD;  Location: [facility];  Service: Radiology;  Laterality: N/A;   TONSILLECTOMY     Social History:  Social History   Socioeconomic History   Marital status: Legally Separated    Spouse name: Not on file   Number of children: 0   Years of education: Not on file   Highest education level: Not on file  Occupational History   Occupation: Secondary school teacher  Tobacco Use   Smoking status: Former    Packs/day: 1.00    Years: 20.00    Total pack years: 20.00    Types: Cigarettes    Start date: 90    Quit date: 2001    Years since quitting: 23.1   Smokeless tobacco: Never  Vaping Use   Vaping Use: Never used  Substance and Sexual Activity   Alcohol use: No   Drug use: No   Sexual activity: Not Currently    Birth control/protection: None, Post-menopausal  Other Topics Concern   Not on file  Social History Narrative   Not on file   Social Determinants of Health   Financial Resource Strain: High Risk (11/10/2019)   Overall Financial Resource Strain (CARDIA)    Difficulty of Paying Living Expenses: Hard  Food Insecurity: Food Insecurity Present (09/16/2021)   Hunger Vital Sign    Worried About Running Out of Food in the Last Year: Never true    Ran Out of Food in the Last Year: Sometimes true  Transportation Needs: No Transportation Needs (09/16/2021)   PRAPARE - Hydrologist (Medical): No    Lack of Transportation (Non-Medical): No  Physical Activity: Inactive  (11/10/2019)   Exercise Vital Sign  Days of Exercise per Week: 0 days    Minutes of Exercise per Session: 0 min  Stress: Not on file  Social Connections: Moderately Isolated (11/10/2019)   Social Connection and Isolation Panel [NHANES]    Frequency of Communication with Friends and Family: Twice a week    Frequency of Social Gatherings with Friends and Family: Twice a week    Attends Religious Services: More than 4 times per year    Active Member of [employer] or Organizations: No    Attends Music therapist: Not on file    Marital Status: Separated  Intimate Partner Violence: Not on file   Family History:  Family History  Problem Relation Age of Onset   Arthritis Mother    Heart failure Father     Review of Systems: Constitutional: Doesn't report fevers, chills or abnormal weight loss Eyes: Doesn't report blurriness of vision Ears, nose, mouth, throat, and face: Doesn't report sore throat Respiratory: Doesn't report cough, dyspnea or wheezes Cardiovascular: Doesn't report palpitation, chest discomfort  Gastrointestinal:  Doesn't report nausea, constipation, diarrhea GU: Doesn't report incontinence Skin: Doesn't report skin rashes Neurological: Per HPI Musculoskeletal: Doesn't report joint pain Behavioral/Psych: Doesn't report anxiety  Physical Exam: There were no vitals filed for this visit.  KPS: 90. General: normal Head: Normal EENT: No conjunctival injection or scleral icterus.  Lungs: Resp effort normal Cardiac: Regular rate Abdomen: Non-distended abdomen Skin: No rashes cyanosis or petechiae. Extremities: No clubbing or edema  Neurologic Exam: Mental Status: Awake, alert, attentive to examiner. Oriented to self and environment. Language is fluent with intact comprehension.  Cranial Nerves: Visual acuity is grossly normal. Visual fields are full. Extra-ocular movements intact. No ptosis. Face is symmetric Motor: Tone and bulk are normal. Power is full in  both arms and legs. Reflexes are symmetric, no pathologic reflexes present.  Sensory: Intact to light touch Gait: Normal.   Labs: I have reviewed the data as listed    Component Value Date/Time   NA 140 05/14/2022 0845   NA 145 (H) 05/13/2021 1220   K 4.2 05/14/2022 0845   CL 105 05/14/2022 0845   CO2 24 05/14/2022 0845   GLUCOSE 83 05/14/2022 0845   BUN 11 05/14/2022 0845   BUN 12 05/13/2021 1220   CREATININE 1.15 (H) 05/14/2022 0845   CREATININE 1.28 (H) 01/08/2022 1025   CALCIUM 8.9 05/14/2022 0845   PROT 6.5 02/10/2022 1221   ALBUMIN 4.1 01/08/2022 1025   ALBUMIN 4.1 08/26/2020 1546   AST 16 01/08/2022 1025   ALT 6 01/08/2022 1025   ALKPHOS 42 01/08/2022 1025   BILITOT 0.4 01/08/2022 1025   GFRNONAA 54 (L) 05/14/2022 0845   GFRNONAA 47 (L) 01/08/2022 1025   GFRAA >60 01/08/2020 1342   GFRAA >60 01/04/2020 0907   Lab Results  Component Value Date   WBC 4.3 05/14/2022   NEUTROABS 3.3 01/08/2022   HGB 12.3 05/14/2022   HCT 37.1 05/14/2022   MCV 93.2 05/14/2022   PLT 115 (L) 05/14/2022    Imaging:  CT CHEST ABDOMEN PELVIS W CONTRAST  Result Date: 05/19/2022 CLINICAL DATA:  Assess treatment response for metastatic non-small-cell lung cancer. * Tracking Code: BO * EXAM: CT CHEST, ABDOMEN, AND PELVIS WITH CONTRAST TECHNIQUE: Multidetector CT imaging of the chest, abdomen and pelvis was performed following the standard protocol during bolus administration of intravenous contrast. RADIATION DOSE REDUCTION: This exam was performed according to the departmental dose-optimization program which includes automated exposure control, adjustment of the mA and/or  kV according to patient size and/or use of iterative reconstruction technique. CONTRAST:  86mL OMNIPAQUE IOHEXOL 300 MG/ML  SOLN COMPARISON:  01/26/2022 CT and older FINDINGS: CT CHEST FINDINGS Cardiovascular: Right upper chest port which is accessed. Some enlargement of the pulmonary artery centrally. Please correlate for  evidence of pulmonary artery hypertension. Scattered vascular calcifications. Normal caliber thoracic aorta. Heart is nonenlarged. Trace pericardial fluid or thickening. Mediastinum/Nodes: Slightly patulous thoracic esophagus. No specific abnormal lymph node enlargement identified in the axillary region, hilum or mediastinum. Mild stranding and thickening along the upper mediastinum, unchanged from previous. Nonspecific Lungs/Pleura: Left lung once again demonstrates some apical pleural thickening similar to previous. Otherwise no left-sided consolidation, pneumothorax or effusion. No new nodule. Breathing motion. The right lung once again has perihilar areas of bronchiectasis, ill-defined opacities with interstitial components which could be posttreatment related as this seems to parallel the mediastinum and hilum. Peripheral areas of scarring atelectatic changes are also noted with pleural thickening. No new mass lesion. Stable elevated right hemidiaphragm as well as part of the posttreatment changes and volume loss. Underlying emphysematous lung changes bilaterally. Musculoskeletal: No chest wall mass or suspicious bone lesions identified. CT ABDOMEN PELVIS FINDINGS Hepatobiliary: Gallbladder is nondilated. Patent portal vein. No space-occupying liver lesion. Pancreas: Unremarkable. No pancreatic ductal dilatation or surrounding inflammatory changes. Spleen: Normal in size without focal abnormality. Adrenals/Urinary Tract: Right adrenal gland is preserved. Once again there is a left adrenal nodule measuring 11 mm on series 2, image 55. On portal venous phase Hounsfield unit of 45 and on delay 17 consistent with an adenoma. This also has been present since at least December 2021. No enhancing renal mass or collecting system dilatation. Slightly malrotated left kidney. Preserved contours of the urinary bladder. No collecting system dilatation Stomach/Bowel: Large bowel is of normal course and caliber with moderate  scattered stool. Normal appendix in the right lower quadrant. The stomach and small bowel are nondilated. Vascular/Lymphatic: Normal caliber aorta and IVC with scattered vascular calcifications. Reproductive: Multilobular uterus with fibroids. There are some noncalcified and separate calcified fibroids. Other: No abdominal or pelvic wall hernia. Mild anasarca. No ascites Musculoskeletal: Mild degenerative changes. Trace retrolisthesis seen of L5 on S1. IMPRESSION: Stable CT scan. No developing new mass lesion, fluid collection or lymph node enlargement. Right-sided perihilar lung opacity as well as left lung apex. These could be posttreatment related. Recommend simple continued follow-up surveillance. Associated volume loss of the right hemithorax. Uterine fibroids. Electronically Signed   By: Jill Side M.D.   On: 05/19/2022 17:43   MR BRAIN W WO CONTRAST  Result Date: 05/15/2022 CLINICAL DATA:  Brain/CNS neoplasm. Assess treatment response. Metastatic lung cancer. EXAM: MRI HEAD WITHOUT AND WITH CONTRAST TECHNIQUE: Multiplanar, multiecho pulse sequences of the brain and surrounding structures were obtained without and with intravenous contrast. CONTRAST:  21mL GADAVIST GADOBUTROL 1 MMOL/ML IV SOLN COMPARISON:  01/15/2022.  09/11/2021. FINDINGS: Brain: Diffusion imaging does not show any acute or subacute infarction or other cause of restricted diffusion. T2 and FLAIR pattern within both cerebral hemispheres is unchanged without evidence of progression. Most prominent signal is in the left frontal region. New or progressive lesions: 5-6 mm focus at the right posterior temporal lobe axial image 159. 2 mm focus of smudgy enhancement within the right posterior temporal lobe axial image 151. Stable or regressed lesions: 11 mm right anterior temporal lobe axial image 157. Numerous clustered small foci at the right parietooccipital junction images 179 through 192. The largest focus measuring 4 mm is  stable. Punctate  focus of enhancement within the left genu of the corpus callosum axial image 196 is stable. Medial left frontoparietal junction lesion axial image 213 is stable. Right frontoparietal vertex lesion axial image 219 is stable. 2-3 mm focus in the left anterior frontal lobe axial image 218 is stable. Left frontal lesion axial image 236 is stable. Punctate focus of enhancement along the surface of a left parietal gyrus axial image 212 is stable. Vascular: Major vessels at the base of the brain show flow. Skull and upper cervical spine: Negative for evidence of metastatic disease. Sinuses/Orbits: Clear/normal Other: None IMPRESSION: 1. New or progressive lesions within the right posterior temporal lobe measuring 5-6 mm in size axial image 159 and 2 mm in the right posterior temporal lobe measuring 2 mm axial image 151. 2. Stable or slightly regressed lesions elsewhere throughout the brain as outlined above. Electronically Signed   By: Nelson Chimes M.D.   On: 05/15/2022 20:36     [facility] Clinician Interpretation: I have personally reviewed the radiological images as listed.  My interpretation, in the context of the patient's clinical presentation, is treatment effect vs true progression   Assessment/Plan Malignant neoplasm metastatic to brain [facility]) [C79.31]  Martia A Chaudhuri is clinically stable today.  Brain MRI demonstrates stability of 4 previously treated lesions which were progressive on October 2023 study.  Etiology is still favored radiation necrosis.  In addition, there are two small novel foci of enhancement, both in the region of a right temporal metastasis which was treated in 2021.  They do not appear to overlap fully with SRS field.    We discussed treating with salvage SRS vs continued close monitoring.  She elected to continue with imaging surveillance only at this time.  For headaches, con't PRN analgesia.   We appreciate the opportunity to participate in the care of [place].    We  ask that Harmon return to clinic in 2 months with brain MRI, or sooner as needed.  We will attempt to perform MRI with oral sedation only, given repeated exposure to general anesthesia.  All questions were answered. The patient knows to call the clinic with any problems, questions or concerns. No barriers to learning were detected.  The total time spent in the encounter was 30 minutes and more than 50% was on counseling and review of test results   Ventura Sellers, MD Medical Director of Neuro-Oncology [facility] 05/26/22 10:40 AM

## 2022-05-28 ENCOUNTER — Other Ambulatory Visit: Payer: Self-pay | Admitting: Radiation Therapy

## 2022-06-09 ENCOUNTER — Other Ambulatory Visit: Payer: Self-pay | Admitting: Emergency Medicine

## 2022-07-01 ENCOUNTER — Other Ambulatory Visit: Payer: Self-pay | Admitting: Emergency Medicine

## 2022-07-23 ENCOUNTER — Ambulatory Visit
Admission: RE | Admit: 2022-07-23 | Discharge: 2022-07-23 | Disposition: A | Discharge status: Home / Self Care | Payer: Medicare Other | Source: Ambulatory Visit | Attending: Internal Medicine | Admitting: Internal Medicine

## 2022-07-23 ENCOUNTER — Inpatient Hospital Stay: Admission: RE | Admit: 2022-07-23 | Payer: Medicare Other | Source: Ambulatory Visit

## 2022-07-23 DIAGNOSIS — C7931 Secondary malignant neoplasm of brain: Secondary | ICD-10-CM

## 2022-07-23 MED ORDER — GADOPICLENOL 0.5 MMOL/ML IV SOLN
7.0000 mL | Freq: Once | INTRAVENOUS | Status: AC | PRN
  Administered 2022-07-23: 7 mL via INTRAVENOUS

## 2022-07-27 ENCOUNTER — Inpatient Hospital Stay: Payer: Medicare Other

## 2022-07-28 ENCOUNTER — Other Ambulatory Visit: Payer: Self-pay | Admitting: *Deleted

## 2022-07-28 ENCOUNTER — Inpatient Hospital Stay: Payer: Medicare Other | Attending: Internal Medicine | Admitting: Internal Medicine

## 2022-07-28 ENCOUNTER — Other Ambulatory Visit: Payer: Self-pay

## 2022-07-28 VITALS — BP 123/63 | HR 89 | Temp 97.8°F | Resp 18 | Wt 138.6 lb

## 2022-07-28 DIAGNOSIS — R519 Headache, unspecified: Secondary | ICD-10-CM | POA: Diagnosis not present

## 2022-07-28 DIAGNOSIS — C7931 Secondary malignant neoplasm of brain: Secondary | ICD-10-CM | POA: Diagnosis not present

## 2022-07-28 DIAGNOSIS — Z87891 Personal history of nicotine dependence: Secondary | ICD-10-CM | POA: Insufficient documentation

## 2022-07-28 DIAGNOSIS — C3491 Malignant neoplasm of unspecified part of right bronchus or lung: Secondary | ICD-10-CM | POA: Insufficient documentation

## 2022-07-28 MED ORDER — ALPRAZOLAM 1 MG PO TABS: 1.0000 mg | ORAL_TABLET | Freq: Once | ORAL | 0 refills | Status: DC | PRN

## 2022-07-28 NOTE — Progress Notes (Signed)
[facility] [address]  [place], [place] [address] [phone]   Interval Evaluation  Date of Service: 07/28/22 Patient Name: DANISA KOPEC Patient MRN: [id number] Patient DOB: [birth year] Provider: Henreitta Leber, MD  Identifying Statement:  NOHEALANI MEDINGER is a 62 y.o. female with Malignant neoplasm metastatic to brain [C79.31]   Primary Cancer:  Oncologic History: Oncology History  Non-small cell lung cancer, right  11/08/2019 Initial Diagnosis   Non-small cell lung cancer, right (HCC)   12/14/2019 - 12/14/2019 Chemotherapy   The patient had cemiplimab-rwlc (LIBTAYO) 350 mg in sodium chloride 0.9 % 100 mL chemo infusion, 350 mg, Intravenous, Once, 0 of 6 cycles  for chemotherapy treatment.    12/14/2019 - 09/25/2021 Chemotherapy   Patient is on Treatment Plan : LUNG CARBOplatin / Pemetrexed / Pembrolizumab q21d Induction x 4 cycles / Maintenance Pemetrexed + Pembrolizumab     12/14/2019 -  Chemotherapy   Patient is on Treatment Plan : LUNG Carboplatin (5) + Pemetrexed (500) + Pembrolizumab (200) D1 q21d Induction x 4 cycles / Maintenance Pemetrexed (500) + Pembrolizumab (200) D1 q21d      CNS Oncologic History 01/12/20: SRS to 22 targets Basilio [place]) 02/11/21: SRS L frontal Basilio [place])  Interval History:  Labrenda A Buehring presents today for follow up after recent MRI brain with anesthesia.  No clinical changes reported today, denies left sided weakness.  Headaches are sporadic, still has dry throat following radiation.  Continues to follow with Dr. Leonides Schanz.  H+P (03/14/20) Patient presents today for follow up after recent MRI brain.  She describes no new or progressive neurologic deficits.  No seizures or headaches.  Still independent with gait and activities of daily living.  Continues on chemotherapy with Dr. Arbutus Ped.  Currently taking decadron  daily.  Medications: Current Outpatient Medications on File Prior to Visit  Medication Sig  Dispense Refill   acetaminophen (TYLENOL) 500 MG tablet Take 1,000 mg by mouth every 6 (six) hours as needed for moderate pain.     ALPRAZolam (XANAX) 1 MG tablet Take 1 tablet (1 mg total) by mouth once as needed for up to 1 dose (mri claustrophobia). Take 45 minutes before MRI study 3 tablet 0   azelastine (ASTELIN) 0.1 % nasal spray Place 2 sprays into both nostrils 2 (two) times daily. Use in each nostril as directed     benzoyl peroxide (CERAVE ACNE FOAMING CREAM) 4 % external liquid Apply 1 application topically 2 (two) times daily. Face     Blood Pressure Monitoring (BLOOD PRESSURE MONITOR AUTOMAT) DEVI Check Blood pressure at home daily 1 each 0   camphor-menthol (SARNA) lotion Apply 1 Application topically as needed for itching.     cetaphil (CETAPHIL) lotion Apply 1 application  topically 2 (two) times daily.     clindamycin (CLEOCIN T) 1 % external solution Apply 1 application topically 2 (two) times daily.     cyclobenzaprine (FLEXERIL) 10 MG tablet Take 1 tablet (10 mg total) by mouth 3 (three) times daily as needed for muscle spasms. 30 tablet 0   diltiazem (CARDIZEM) 30 MG tablet Take 1 tablet (30 mg total) by mouth 4 (four) times daily as needed (afib episodes). 45 tablet 1   fexofenadine (ALLEGRA) 180 MG tablet Take 180 mg by mouth in the morning.     folic acid (FOLVITE) 1 MG tablet Take 1 tablet (1 mg total) by mouth daily. 90 tablet 1   HYDROcodone-acetaminophen (NORCO/VICODIN) 5-325 MG tablet Take 1  tablet by mouth every 6 (six) hours as needed for moderate pain. 30 tablet 0   lidocaine-prilocaine (EMLA) cream Apply 1 application topically as needed. 30 g 2   Menthol, Topical Analgesic, (ICY HOT EX) Apply 1 application  topically daily as needed (feet pain).     Multiple Minerals-Vitamins (CAL MAG ZINC +D3) TABS Take 3 capsules by mouth daily.     Multiple Vitamins-Minerals (ALIVE MULTI-VITAMIN PO) Take 1 tablet by mouth daily.     ondansetron (ZOFRAN) 8 MG tablet TAKE 1 TABLET  BY MOUTH EVERY 8 HOURS AS NEEDED FOR NAUSEA OR VOMITING 20 tablet 0   Polyethyl Glycol-Propyl Glycol (LUBRICANT EYE DROPS) 0.4-0.3 % SOLN Apply 1-2 drops to eye 3 (three) times daily as needed (dry/irritated eyes).     SYMBICORT 80-4.5 MCG/ACT inhaler INHALE 2 PUFFS BY MOUTH IN THE MORNING AND AT BEDTIME 11 g 0   triamcinolone ointment (KENALOG) 0.1 % Apply 1 application  topically at bedtime.     VENTOLIN HFA 108 (90 Base) MCG/ACT inhaler INHALE 1 TO 2 PUFFS BY MOUTH EVERY 6 HOURS AS NEEDED FOR WHEEZING FOR SHORTNESS OF BREATH 18 g 0   No current facility-administered medications on file prior to visit.    Allergies:  Allergies  Allergen Reactions   Sudafed [Pseudoephedrine] Hypertension   Amlodipine Swelling   Atorvastatin Other (See Comments)    Cramping   Gabapentin Other (See Comments)    Raise blood pressure and red rings around eyes Blood vessels popped in her eyes   Mobic [Meloxicam] Swelling    Inflamed the area that has inflammation and stabbing pains in the area   Penicillins Hives     Childhood reaction   Prednisone     Caused A-fib   Past Medical History:  Past Medical History:  Diagnosis Date   Anemia    Angina 1982   related to stress   Asthma    in the past,  no current problems   Atrial fibrillation (HCC)    Complication of anesthesia    states anesthesia made her hair fall out, old meds. hard to awaken 1 time she took Flexeril before.   Constipation    COPD (chronic obstructive pulmonary disease) (HCC)    Dyspnea    on oxygen at home - 3L via [place]   Dysrhythmia    Fatigue    GERD (gastroesophageal reflux disease)    patient denies this dx   Heart murmur 1970s   no problems currently   Hypertension    Ingrown toenail    Lung cancer (HCC) 10/2019   metastatic disease to the brain   On home oxygen therapy    2L via [place] - 24 hours a day   Past heart attack 1980-1981   pt states she passed out and woke up in hospital- told she had heart attack, but then  dr said he couldn't find anything wrong.   Peripheral vascular disease (HCC)    Pneumonia    x 1   Past Surgical History:  Past Surgical History:  Procedure Laterality Date   CERVICAL DISC SURGERY  2000   Disc removed from neck    ingrown toe nail surgery Bilateral    IR IMAGING GUIDED PORT INSERTION  02/16/2020   MULTIPLE TOOTH EXTRACTIONS     for braces   RADIOLOGY WITH ANESTHESIA N/A 12/05/2019   Procedure: MRI BRAIN WITH AND WITHOUT CONTRAST;  Surgeon: Radiologist, Medication, MD;  Location: [facility] OR;  Service: Radiology;  Laterality: N/A;  RADIOLOGY WITH ANESTHESIA N/A 01/02/2020   Procedure: MRI BRAIN WITH AND WITHOUT CONTRAST;  Surgeon: Radiologist, Medication, MD;  Location: [facility] OR;  Service: Radiology;  Laterality: N/A;   RADIOLOGY WITH ANESTHESIA N/A 02/20/2020   Procedure: MRI WITH ANESTHESIA OF BRAIN WITH AND WITHOUT CONTRAST;  Surgeon: Radiologist, Medication, MD;  Location: [facility] OR;  Service: Radiology;  Laterality: N/A;   RADIOLOGY WITH ANESTHESIA N/A 06/11/2020   Procedure: MRI WITH ANESTHESIA OF BRAIN WITH AND WITHOUT CONTRAST;  Surgeon: Radiologist, Medication, MD;  Location: [facility] OR;  Service: Radiology;  Laterality: N/A;   RADIOLOGY WITH ANESTHESIA N/A 10/10/2020   Procedure: MRI WITH ANESTHESIA BRAIN WITH AND WITHOUT CONTRAST;  Surgeon: Radiologist, Medication, MD;  Location: [facility];  Service: Radiology;  Laterality: N/A;   RADIOLOGY WITH ANESTHESIA N/A 01/30/2021   Procedure: MRI BRAIN WITH AND WITHOUT CONTRASTWITH ANESTHESIA;  Surgeon: Radiologist, Medication, MD;  Location: [facility];  Service: Radiology;  Laterality: N/A;   RADIOLOGY WITH ANESTHESIA N/A 06/12/2021   Procedure: MRI WITH ANESTHESIA OF BRAIN WITH AND WITHOUT CONTRAST;  Surgeon: Radiologist, Medication, MD;  Location: [facility];  Service: Radiology;  Laterality: N/A;   RADIOLOGY WITH ANESTHESIA N/A 09/11/2021   Procedure: MRI OF BRAIN WITH AND WITHOUT CONTRAST WITH ANESTHESIA;  Surgeon: Radiologist, Medication, MD;  Location:  [facility];  Service: Radiology;  Laterality: N/A;   RADIOLOGY WITH ANESTHESIA N/A 01/15/2022   Procedure: MRI BRAIN WITH AND WITHOUT CONTRAST WITH ANESTHESIA;  Surgeon: Radiologist, Medication, MD;  Location: [facility];  Service: Radiology;  Laterality: N/A;   RADIOLOGY WITH ANESTHESIA N/A 05/14/2022   Procedure: MRI WITH ANESTHESIA BRAIN WITH AND WITHOUT CONTRAST;  Surgeon: Radiologist, Medication, MD;  Location: [facility];  Service: Radiology;  Laterality: N/A;   TONSILLECTOMY     Social History:  Social History   Socioeconomic History   Marital status: Legally Separated    Spouse name: Not on file   Number of children: 0   Years of education: Not on file   Highest education level: Not on file  Occupational History   Occupation: Investment banker, corporate  Tobacco Use   Smoking status: Former    Packs/day: 1.00    Years: 20.00    Additional pack years: 0.00    Total pack years: 20.00    Types: Cigarettes    Start date: 37    Quit date: 2001    Years since quitting: 23.3   Smokeless tobacco: Never  Vaping Use   Vaping Use: Never used  Substance and Sexual Activity   Alcohol use: No   Drug use: No   Sexual activity: Not Currently    Birth control/protection: None, Post-menopausal  Other Topics Concern   Not on file  Social History Narrative   Not on file   Social Determinants of Health   Financial Resource Strain: High Risk (11/10/2019)   Overall Financial Resource Strain (CARDIA)    Difficulty of Paying Living Expenses: Hard  Food Insecurity: Food Insecurity Present (09/16/2021)   Hunger Vital Sign    Worried About Running Out of Food in the Last Year: Never true    Ran Out of Food in the Last Year: Sometimes true  Transportation Needs: No Transportation Needs (09/16/2021)   PRAPARE - Administrator, Civil Service (Medical): No    Lack of Transportation (Non-Medical): No  Physical Activity: Inactive (11/10/2019)   Exercise Vital Sign    Days of Exercise per Week: 0 days     Minutes of Exercise per Session:  0 min  Stress: Not on file  Social Connections: Moderately Isolated (11/10/2019)   Social Connection and Isolation Panel [NHANES]    Frequency of Communication with Friends and Family: Twice a week    Frequency of Social Gatherings with Friends and Family: Twice a week    Attends Religious Services: More than 4 times per year    Active Member of [employer] or Organizations: No    Attends Engineer, structural: Not on file    Marital Status: Separated  Intimate Partner Violence: Not on file   Family History:  Family History  Problem Relation Age of Onset   Arthritis Mother    Heart failure Father     Review of Systems: Constitutional: Doesn't report fevers, chills or abnormal weight loss Eyes: Doesn't report blurriness of vision Ears, nose, mouth, throat, and face: Doesn't report sore throat Respiratory: Doesn't report cough, dyspnea or wheezes Cardiovascular: Doesn't report palpitation, chest discomfort  Gastrointestinal:  Doesn't report nausea, constipation, diarrhea GU: Doesn't report incontinence Skin: Doesn't report skin rashes Neurological: Per HPI Musculoskeletal: Doesn't report joint pain Behavioral/Psych: Doesn't report anxiety  Physical Exam: Vitals:   07/28/22 1028  BP: 123/63  Pulse: 89  Resp: 18  Temp: 97.8 F (36.6 C)  SpO2: 97%    KPS: 90. General: normal Head: Normal EENT: No conjunctival injection or scleral icterus.  Lungs: Resp effort normal Cardiac: Regular rate Abdomen: Non-distended abdomen Skin: No rashes cyanosis or petechiae. Extremities: No clubbing or edema  Neurologic Exam: Mental Status: Awake, alert, attentive to examiner. Oriented to self and environment. Language is fluent with intact comprehension.  Cranial Nerves: Visual acuity is grossly normal. Visual fields are full. Extra-ocular movements intact. No ptosis. Face is symmetric Motor: Tone and bulk are normal. Power is full in both arms and  legs. Reflexes are symmetric, no pathologic reflexes present.  Sensory: Intact to light touch Gait: Normal.   Labs: I have reviewed the data as listed    Component Value Date/Time   NA 140 05/14/2022 0845   NA 145 (H) 05/13/2021 1220   K 4.2 05/14/2022 0845   CL 105 05/14/2022 0845   CO2 24 05/14/2022 0845   GLUCOSE 83 05/14/2022 0845   BUN 11 05/14/2022 0845   BUN 12 05/13/2021 1220   CREATININE 1.15 (H) 05/14/2022 0845   CREATININE 1.28 (H) 01/08/2022 1025   CALCIUM 8.9 05/14/2022 0845   PROT 6.5 02/10/2022 1221   ALBUMIN 4.1 01/08/2022 1025   ALBUMIN 4.1 08/26/2020 1546   AST 16 01/08/2022 1025   ALT 6 01/08/2022 1025   ALKPHOS 42 01/08/2022 1025   BILITOT 0.4 01/08/2022 1025   GFRNONAA 54 (L) 05/14/2022 0845   GFRNONAA 47 (L) 01/08/2022 1025   GFRAA >60 01/08/2020 1342   GFRAA >60 01/04/2020 0907   Lab Results  Component Value Date   WBC 4.3 05/14/2022   NEUTROABS 3.3 01/08/2022   HGB 12.3 05/14/2022   HCT 37.1 05/14/2022   MCV 93.2 05/14/2022   PLT 115 (L) 05/14/2022    Imaging:  MR BRAIN W WO CONTRAST  Result Date: 07/27/2022 CLINICAL DATA:  Follow-up metastatic lung cancer to the brain. EXAM: MRI HEAD WITHOUT AND WITH CONTRAST TECHNIQUE: Multiplanar, multiecho pulse sequences of the brain and surrounding structures were obtained without and with intravenous contrast. CONTRAST:  7 cc of vueway intravenous COMPARISON:  05/14/2022 FINDINGS: Brain: 8 enhancing brain lesions: A left parietal cortically based lesion with 2 adjacent nodules on 13:114 is the only level of  progression, with the more anterior separate nodule newly seen and measuring 2 mm. The other lesions and patchy vasogenic edema are non progressed. Most notable confluent lesion is in the parasagittal left frontal lobe on 13:131, up to 15 mm. There is also an elongated high right frontal cortically based lesion measuring 2 cm in length on 13:125. The other lesions are also marked on series 13. No acute  infarct, hemorrhage, hydrocephalus, or collection. Vascular: Major flow voids and vascular enhancements are preserved Skull and upper cervical spine: Normal marrow signal Sinuses/Orbits: Negative IMPRESSION: 2 mm increase at the left parietal cortically based lesion since 05/14/2022. Remaining brain metastases are unchanged with stable vasogenic edema not causing significant mass effect. Electronically Signed   By: Tiburcio Pea M.D.   On: 07/27/2022 11:58     [facility] Clinician Interpretation: I have personally reviewed the radiological images as listed.  My interpretation, in the context of the patient's clinical presentation, is stable disease   Assessment/Plan Malignant neoplasm metastatic to brain [C79.31]  Glorene A Samad is clinically stable today.  Brain MRI demonstrates mainly stable findings, very slight progression of left parietal metastasis previously treated.    She is agreeable with continued imaging surveillance only at this time.  For headaches, con't PRN analgesia.   We appreciate the opportunity to participate in the care of Emili A Demers.    We ask that Yesmin A Swartzlander return to clinic in 4 months with brain MRI, or sooner as needed.    All questions were answered. The patient knows to call the clinic with any problems, questions or concerns. No barriers to learning were detected.  The total time spent in the encounter was 30 minutes and more than 50% was on counseling and review of test results   Henreitta Leber, MD Medical Director of Neuro-Oncology [facility] [place] 07/28/22 10:48 AM

## 2022-07-30 ENCOUNTER — Ambulatory Visit (INDEPENDENT_AMBULATORY_CARE_PROVIDER_SITE_OTHER): Payer: Medicare Other | Admitting: Emergency Medicine

## 2022-07-30 ENCOUNTER — Encounter: Payer: Self-pay | Admitting: Emergency Medicine

## 2022-07-30 VITALS — BP 124/68 | HR 86 | Temp 98.0°F | Ht 66.0 in | Wt 138.4 lb

## 2022-07-30 DIAGNOSIS — J4489 Other specified chronic obstructive pulmonary disease: Secondary | ICD-10-CM

## 2022-07-30 DIAGNOSIS — C3491 Malignant neoplasm of unspecified part of right bronchus or lung: Secondary | ICD-10-CM

## 2022-07-30 MED ORDER — SYMBICORT 80-4.5 MCG/ACT IN AERO: INHALATION_SPRAY | RESPIRATORY_TRACT | 11 refills | Status: DC

## 2022-07-30 NOTE — Patient Instructions (Addendum)
We will refill your Symbicort.  Use 2 puffs twice a day, rinse and gargle after using.  Keep track of whether the medication helps her breathing.  Depending on your response we may decide to change to an alternative at your next visit. Continue use albuterol 2 puffs up to every 4 hours if needed for shortness of breath, chest tightness, wheezing. We will refer you to pulmonary rehab in [place] Continue to follow with oncology as planned Follow with Dr Delton Coombes in 3 months or sooner if you have any problems.

## 2022-07-30 NOTE — Assessment & Plan Note (Signed)
Following with oncology.  Her most recent MRI brain (07/23/2022) and chest imaging (February) were stable.

## 2022-07-30 NOTE — Addendum Note (Signed)
Addended by: Dorisann Frames R on: 07/30/2022 10:40 AM   Modules accepted: Orders

## 2022-07-30 NOTE — Assessment & Plan Note (Signed)
He is missing scheduled bronchodilator therapy, was on Symbicort.  We will restart this for now.  May be a candidate to change to LABA/LAMA/ICS going forward depending on her response.  She is interested in pulmonary rehab and we will make that referral today.

## 2022-07-30 NOTE — Progress Notes (Signed)
   Subjective:    Patient ID: Elizabeth Mayer, female    DOB: [birth year], 62 y.o.   MRN: [id number]  HPI  ROV 07/30/2022 --follow-up visit for 62 year old woman with COPD/asthma, stage IV adenocarcinoma of the lung with brain metastases and history of postobstructive pneumonia in the past.  She has chronic hypoxemic respiratory failure on 2 L/min with exertion and while sleeping.  She had a stable MRI brain 07/23/2022.  She did not desaturate on an ambulatory oximetry at our last visit in January.  Today she reports Allegra, Astelin nasal spray.  Has been managed on Symbicort, ran out. She believes that she was benefiting from symbicort. She is using albuterol about 2-3x a day, does benefit. She is having some SOB w exertion, some SOB when talking. Her voice is hoarse.   CT chest/abdomen/pelvis 05/19/2022 reviewed by me shows no new mass lesions or fluid collections, no new lymphadenopathy.  Posttreatment changes at the right apex.   Review of Systems As per HPI     Objective:   Physical Exam  Today's Vitals   07/30/22 0915  BP: 124/68  Pulse: 86  Temp: 98 F (36.7 C)  TempSrc: Oral  SpO2: 99%  Weight: 138 lb 6.4 oz (62.8 kg)  Height:  (1.676 m)   Body mass index is 22.34 kg/m.;sm  Gen: Pleasant, well-nourished, in no distress,  normal affect  ENT: No lesions,  mouth clear,  oropharynx clear, no postnasal drip, soft voice, hoarse voice  Neck: No JVD, no stridor  Lungs: No use of accessory muscles, no crackles or wheezing on normal respiration, no wheeze on forced expiration  Cardiovascular: RRR, heart sounds normal, no murmur or gallops, no peripheral edema  Musculoskeletal: No deformities, no cyanosis or clubbing  Neuro: alert, awake, non focal  Skin: Warm, no lesions or rash     Assessment & Plan:  Asthma with COPD (HCC) He is missing scheduled bronchodilator therapy, was on Symbicort.  We will restart this for now.  May be a candidate to change to LABA/LAMA/ICS  going forward depending on her response.  She is interested in pulmonary rehab and we will make that referral today.  Non-small cell lung cancer, right [facility] Following with oncology.  Her most recent MRI brain (07/23/2022) and chest imaging (February) were stable.  Levy Pupa, MD, PhD 07/30/2022, 9:35 AM [facility] [phone] or if no answer [phone]

## 2022-07-31 ENCOUNTER — Other Ambulatory Visit: Payer: Self-pay | Admitting: Emergency Medicine

## 2022-08-04 ENCOUNTER — Encounter: Payer: Self-pay | Admitting: Physician Assistant

## 2022-08-04 ENCOUNTER — Ambulatory Visit: Payer: Medicare Other | Attending: Internal Medicine | Admitting: Physical Therapy

## 2022-08-04 ENCOUNTER — Other Ambulatory Visit: Payer: Self-pay

## 2022-08-04 ENCOUNTER — Encounter: Payer: Self-pay | Admitting: Physical Therapy

## 2022-08-04 ENCOUNTER — Encounter: Payer: Self-pay | Admitting: Hematology and Oncology

## 2022-08-04 DIAGNOSIS — C7931 Secondary malignant neoplasm of brain: Secondary | ICD-10-CM | POA: Diagnosis present

## 2022-08-04 DIAGNOSIS — M6281 Muscle weakness (generalized): Secondary | ICD-10-CM | POA: Insufficient documentation

## 2022-08-04 DIAGNOSIS — R262 Difficulty in walking, not elsewhere classified: Secondary | ICD-10-CM | POA: Insufficient documentation
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
## 2022-08-06 ENCOUNTER — Encounter (HOSPITAL_COMMUNITY): Payer: Self-pay

## 2022-08-11 ENCOUNTER — Ambulatory Visit: Payer: Medicare Other | Admitting: Nurse Practitioner

## 2022-08-11 ENCOUNTER — Ambulatory Visit: Payer: Medicare Other | Attending: Internal Medicine | Admitting: Physical Therapy

## 2022-08-11 ENCOUNTER — Encounter: Payer: Self-pay | Admitting: Physical Therapy

## 2022-08-11 DIAGNOSIS — C7931 Secondary malignant neoplasm of brain: Secondary | ICD-10-CM

## 2022-08-11 DIAGNOSIS — R262 Difficulty in walking, not elsewhere classified: Secondary | ICD-10-CM

## 2022-08-11 DIAGNOSIS — M6281 Muscle weakness (generalized): Secondary | ICD-10-CM
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
## 2022-08-13 ENCOUNTER — Ambulatory Visit: Payer: Medicare Other

## 2022-08-13 DIAGNOSIS — R262 Difficulty in walking, not elsewhere classified: Secondary | ICD-10-CM

## 2022-08-13 DIAGNOSIS — M6281 Muscle weakness (generalized): Secondary | ICD-10-CM | POA: Diagnosis not present

## 2022-08-13 DIAGNOSIS — C7931 Secondary malignant neoplasm of brain: Secondary | ICD-10-CM

## 2022-08-13 NOTE — Patient Instructions (Signed)
Cancer Rehab [phone] HIP: Flexion Standing    Holding onto counter lift leg straight in front keeping knee straight. Slow and controlled! _10__ reps per set, _2-3__ sets per day. Then repeat with other leg.   Hip Extension (Standing)    Stand with support at counter. Squeeze pelvic floor and hold so as not to twist hips and don't lean forward.  Move right leg backward with straight knee. Slow and controlled! Repeat _10__ times. Do _2-3__ times a day. Repeat with other leg.  Hip Abduction (Standing)    Stand with support. Squeeze pelvic floor and hold. Lift right leg out to side, keeping toe forward.  Repeat _10__ times. Do _2-3__ times a day. Repeat with other leg.   Heel Raise: Bilateral (Standing)       Stand near counter for fingertip support if needed. Rise on balls of feet. Repeat __10-20__ times per set. Do _1-2___ sets per session. Do __2__ sessions per day.  SINGLE LIMB STANCE    Stand at counter (corner if you have one) for minimal arm support. Raise leg. Hold _10-20__ seconds. Repeat with other leg.  _3-5__ reps per set, _2-3__ sets per day.   Tandem Stance    Stand at counter (corner if you have one). Right foot in front of left, heel touching toe both feet "straight ahead". Stand on Foot Triangle of Support with both feet. Balance in this position _10-20__ seconds. Do with left foot in front of right.
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
## 2022-08-18 ENCOUNTER — Ambulatory Visit: Payer: Medicare Other | Admitting: Physical Therapy

## 2022-08-18 ENCOUNTER — Encounter: Payer: Self-pay | Admitting: Physical Therapy

## 2022-08-18 DIAGNOSIS — C7931 Secondary malignant neoplasm of brain: Secondary | ICD-10-CM

## 2022-08-18 DIAGNOSIS — M6281 Muscle weakness (generalized): Secondary | ICD-10-CM | POA: Diagnosis not present

## 2022-08-18 DIAGNOSIS — R262 Difficulty in walking, not elsewhere classified: Secondary | ICD-10-CM
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
## 2022-08-19 ENCOUNTER — Ambulatory Visit: Payer: Medicare Other | Admitting: Family Medicine

## 2022-08-20 ENCOUNTER — Ambulatory Visit: Payer: Medicare Other

## 2022-08-20 DIAGNOSIS — C7931 Secondary malignant neoplasm of brain: Secondary | ICD-10-CM

## 2022-08-20 DIAGNOSIS — R262 Difficulty in walking, not elsewhere classified: Secondary | ICD-10-CM

## 2022-08-20 DIAGNOSIS — M6281 Muscle weakness (generalized): Secondary | ICD-10-CM | POA: Diagnosis not present
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
## 2022-08-25 ENCOUNTER — Ambulatory Visit: Payer: Medicare Other | Admitting: Physical Therapy

## 2022-08-25 ENCOUNTER — Encounter: Payer: Self-pay | Admitting: Physical Therapy

## 2022-08-25 DIAGNOSIS — R262 Difficulty in walking, not elsewhere classified: Secondary | ICD-10-CM

## 2022-08-25 DIAGNOSIS — C7931 Secondary malignant neoplasm of brain: Secondary | ICD-10-CM

## 2022-08-25 DIAGNOSIS — M6281 Muscle weakness (generalized): Secondary | ICD-10-CM | POA: Diagnosis not present
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
## 2022-08-27 ENCOUNTER — Encounter: Payer: Self-pay | Admitting: Physical Therapy

## 2022-08-27 ENCOUNTER — Ambulatory Visit: Payer: Medicare Other | Admitting: Physical Therapy

## 2022-08-27 DIAGNOSIS — C7931 Secondary malignant neoplasm of brain: Secondary | ICD-10-CM

## 2022-08-27 DIAGNOSIS — R262 Difficulty in walking, not elsewhere classified: Secondary | ICD-10-CM

## 2022-08-27 DIAGNOSIS — M6281 Muscle weakness (generalized): Secondary | ICD-10-CM | POA: Diagnosis not present
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
## 2022-08-28 ENCOUNTER — Telehealth: Payer: Self-pay | Admitting: Emergency Medicine

## 2022-08-28 NOTE — Telephone Encounter (Signed)
Patient states Symbicort is not covered by insurance. Advair HFA or [name] is covered by insurance. Pharmacy is [facility]. Patient phone number is [phone]

## 2022-08-28 NOTE — Telephone Encounter (Signed)
Just want to verify what's covered by insurance please advise? With prices

## 2022-09-01 ENCOUNTER — Ambulatory Visit: Payer: Medicare Other | Admitting: Physical Therapy

## 2022-09-01 ENCOUNTER — Encounter: Payer: Self-pay | Admitting: Physical Therapy

## 2022-09-01 DIAGNOSIS — M6281 Muscle weakness (generalized): Secondary | ICD-10-CM

## 2022-09-01 DIAGNOSIS — C7931 Secondary malignant neoplasm of brain: Secondary | ICD-10-CM

## 2022-09-01 DIAGNOSIS — R262 Difficulty in walking, not elsewhere classified: Secondary | ICD-10-CM
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
## 2022-09-03 ENCOUNTER — Ambulatory Visit: Payer: Medicare Other | Admitting: Physical Therapy

## 2022-09-04 ENCOUNTER — Encounter: Payer: Self-pay | Admitting: Hematology and Oncology

## 2022-09-04 ENCOUNTER — Other Ambulatory Visit (HOSPITAL_COMMUNITY): Payer: Self-pay

## 2022-09-04 ENCOUNTER — Encounter: Payer: Self-pay | Admitting: Physician Assistant

## 2022-09-04 NOTE — Telephone Encounter (Signed)
Routing to Dr. Delton Coombes for review.

## 2022-09-04 NOTE — Telephone Encounter (Signed)
I would prefer the Dulera 100, 2 puffs bid

## 2022-09-04 NOTE — Telephone Encounter (Signed)
Symbicort not covered. Per test claims:   Advair HFA-$11.20 Dulera-$11.20

## 2022-09-07 ENCOUNTER — Encounter: Payer: Self-pay | Admitting: Physical Therapy

## 2022-09-07 ENCOUNTER — Ambulatory Visit: Payer: Medicare Other | Attending: Internal Medicine | Admitting: Physical Therapy

## 2022-09-07 DIAGNOSIS — R262 Difficulty in walking, not elsewhere classified: Secondary | ICD-10-CM | POA: Diagnosis present

## 2022-09-07 DIAGNOSIS — C7931 Secondary malignant neoplasm of brain: Secondary | ICD-10-CM | POA: Insufficient documentation

## 2022-09-07 DIAGNOSIS — M6281 Muscle weakness (generalized): Secondary | ICD-10-CM | POA: Insufficient documentation

## 2022-09-07 MED ORDER — MOMETASONE FURO-FORMOTEROL FUM 100-5 MCG/ACT IN AERO: 2.0000 | INHALATION_SPRAY | Freq: Two times a day (BID) | RESPIRATORY_TRACT | 11 refills | Status: DC
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
## 2022-09-07 NOTE — Telephone Encounter (Signed)
Called and spoke with pt letting her know info per RB about change in inhaler and she verbalized understanding. Rx for [name] has been sent to preferred pharmacy for pt. Nothing further needed.

## 2022-09-09 ENCOUNTER — Telehealth (HOSPITAL_COMMUNITY): Payer: Self-pay

## 2022-09-09 NOTE — Telephone Encounter (Signed)
Pt is unable to participate in the pulmonary rehab program at this time. Closed referral.

## 2022-09-10 ENCOUNTER — Ambulatory Visit: Payer: Medicare Other

## 2022-09-10 DIAGNOSIS — C7931 Secondary malignant neoplasm of brain: Secondary | ICD-10-CM

## 2022-09-10 DIAGNOSIS — M6281 Muscle weakness (generalized): Secondary | ICD-10-CM | POA: Diagnosis not present

## 2022-09-10 DIAGNOSIS — R262 Difficulty in walking, not elsewhere classified: Secondary | ICD-10-CM
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
## 2022-09-15 ENCOUNTER — Ambulatory Visit: Payer: Medicare Other | Admitting: Physical Therapy

## 2022-09-15 ENCOUNTER — Other Ambulatory Visit: Payer: Self-pay | Admitting: Emergency Medicine

## 2022-09-15 ENCOUNTER — Encounter: Payer: Self-pay | Admitting: Physical Therapy

## 2022-09-15 DIAGNOSIS — C7931 Secondary malignant neoplasm of brain: Secondary | ICD-10-CM

## 2022-09-15 DIAGNOSIS — M6281 Muscle weakness (generalized): Secondary | ICD-10-CM

## 2022-09-15 DIAGNOSIS — R262 Difficulty in walking, not elsewhere classified: Secondary | ICD-10-CM
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
## 2022-09-17 ENCOUNTER — Ambulatory Visit: Payer: Medicare Other

## 2022-09-18 ENCOUNTER — Ambulatory Visit: Payer: Medicare Other

## 2022-09-18 DIAGNOSIS — R262 Difficulty in walking, not elsewhere classified: Secondary | ICD-10-CM

## 2022-09-18 DIAGNOSIS — M6281 Muscle weakness (generalized): Secondary | ICD-10-CM | POA: Diagnosis not present

## 2022-09-18 DIAGNOSIS — C7931 Secondary malignant neoplasm of brain: Secondary | ICD-10-CM
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
## 2022-09-22 ENCOUNTER — Ambulatory Visit: Payer: Medicare Other

## 2022-09-22 DIAGNOSIS — M6281 Muscle weakness (generalized): Secondary | ICD-10-CM

## 2022-09-22 DIAGNOSIS — C7931 Secondary malignant neoplasm of brain: Secondary | ICD-10-CM

## 2022-09-22 DIAGNOSIS — R262 Difficulty in walking, not elsewhere classified: Secondary | ICD-10-CM
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
## 2022-09-24 ENCOUNTER — Ambulatory Visit: Payer: Medicare Other

## 2022-09-24 DIAGNOSIS — M6281 Muscle weakness (generalized): Secondary | ICD-10-CM

## 2022-09-24 DIAGNOSIS — C7931 Secondary malignant neoplasm of brain: Secondary | ICD-10-CM

## 2022-09-24 DIAGNOSIS — R262 Difficulty in walking, not elsewhere classified: Secondary | ICD-10-CM
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
## 2022-09-29 ENCOUNTER — Ambulatory Visit: Payer: Medicare Other | Admitting: Physical Therapy

## 2022-09-29 ENCOUNTER — Encounter: Payer: Self-pay | Admitting: Physical Therapy

## 2022-09-29 DIAGNOSIS — M6281 Muscle weakness (generalized): Secondary | ICD-10-CM | POA: Diagnosis not present

## 2022-09-29 DIAGNOSIS — R262 Difficulty in walking, not elsewhere classified: Secondary | ICD-10-CM

## 2022-09-29 DIAGNOSIS — C7931 Secondary malignant neoplasm of brain: Secondary | ICD-10-CM
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
## 2022-10-01 ENCOUNTER — Ambulatory Visit: Payer: Medicare Other | Admitting: Physical Therapy

## 2022-10-01 ENCOUNTER — Encounter: Payer: Self-pay | Admitting: Physical Therapy

## 2022-10-01 DIAGNOSIS — C7931 Secondary malignant neoplasm of brain: Secondary | ICD-10-CM

## 2022-10-01 DIAGNOSIS — M6281 Muscle weakness (generalized): Secondary | ICD-10-CM | POA: Diagnosis not present

## 2022-10-01 DIAGNOSIS — R262 Difficulty in walking, not elsewhere classified: Secondary | ICD-10-CM
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
## 2022-10-06 ENCOUNTER — Ambulatory Visit: Payer: Medicare Other | Attending: Internal Medicine | Admitting: Physical Therapy

## 2022-10-06 ENCOUNTER — Encounter: Payer: Self-pay | Admitting: Physical Therapy

## 2022-10-06 DIAGNOSIS — R262 Difficulty in walking, not elsewhere classified: Secondary | ICD-10-CM | POA: Insufficient documentation

## 2022-10-06 DIAGNOSIS — M6281 Muscle weakness (generalized): Secondary | ICD-10-CM | POA: Insufficient documentation

## 2022-10-06 DIAGNOSIS — C7931 Secondary malignant neoplasm of brain: Secondary | ICD-10-CM | POA: Insufficient documentation
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
## 2022-10-09 ENCOUNTER — Encounter: Payer: Self-pay | Admitting: Physical Therapy

## 2022-10-13 ENCOUNTER — Encounter: Payer: Self-pay | Admitting: Physical Therapy

## 2022-10-13 ENCOUNTER — Ambulatory Visit: Payer: Medicare Other | Admitting: Physical Therapy

## 2022-10-13 DIAGNOSIS — R262 Difficulty in walking, not elsewhere classified: Secondary | ICD-10-CM

## 2022-10-13 DIAGNOSIS — M6281 Muscle weakness (generalized): Secondary | ICD-10-CM | POA: Diagnosis not present

## 2022-10-13 DIAGNOSIS — C7931 Secondary malignant neoplasm of brain: Secondary | ICD-10-CM
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
## 2022-10-15 ENCOUNTER — Ambulatory Visit: Payer: Medicare Other | Admitting: Physical Therapy

## 2022-10-15 ENCOUNTER — Encounter: Payer: Self-pay | Admitting: Physical Therapy

## 2022-10-15 DIAGNOSIS — M6281 Muscle weakness (generalized): Secondary | ICD-10-CM

## 2022-10-15 DIAGNOSIS — C7931 Secondary malignant neoplasm of brain: Secondary | ICD-10-CM

## 2022-10-15 DIAGNOSIS — R262 Difficulty in walking, not elsewhere classified: Secondary | ICD-10-CM
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
## 2022-10-20 ENCOUNTER — Ambulatory Visit: Payer: Medicare Other | Admitting: Physical Therapy

## 2022-10-20 ENCOUNTER — Encounter: Payer: Self-pay | Admitting: Physical Therapy

## 2022-10-20 DIAGNOSIS — M6281 Muscle weakness (generalized): Secondary | ICD-10-CM

## 2022-10-20 DIAGNOSIS — C7931 Secondary malignant neoplasm of brain: Secondary | ICD-10-CM

## 2022-10-20 DIAGNOSIS — R262 Difficulty in walking, not elsewhere classified: Secondary | ICD-10-CM
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
## 2022-10-23 ENCOUNTER — Ambulatory Visit: Payer: Medicare Other | Admitting: Emergency Medicine

## 2022-10-27 ENCOUNTER — Ambulatory Visit: Payer: Medicare Other

## 2022-10-27 DIAGNOSIS — C7931 Secondary malignant neoplasm of brain: Secondary | ICD-10-CM

## 2022-10-27 DIAGNOSIS — M6281 Muscle weakness (generalized): Secondary | ICD-10-CM | POA: Diagnosis not present

## 2022-10-27 DIAGNOSIS — R262 Difficulty in walking, not elsewhere classified: Secondary | ICD-10-CM
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
## 2022-10-29 ENCOUNTER — Ambulatory Visit: Payer: Medicare Other

## 2022-10-29 DIAGNOSIS — M6281 Muscle weakness (generalized): Secondary | ICD-10-CM

## 2022-10-29 DIAGNOSIS — C7931 Secondary malignant neoplasm of brain: Secondary | ICD-10-CM

## 2022-10-29 DIAGNOSIS — R262 Difficulty in walking, not elsewhere classified: Secondary | ICD-10-CM
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
## 2022-11-01 IMAGING — MR MR HEAD WO/W CM
11 of 14 series · 23 of 48 positions shown · IV contrast (cc gad)
Comparison: MRI head 01/30/2021

CLINICAL DATA: Brain metastasis.  Assess treatment response

EXAM:
MRI HEAD WITHOUT AND WITH CONTRAST
TECHNIQUE: Multiplanar, multiecho pulse sequences of the brain and surrounding
structures were obtained without and with intravenous contrast.
CONTRAST:  6.5mL GADAVIST GADOBUTROL 1 MMOL/ML IV SOLN

[Series 2: FLAIR · sagittal · 3.0mm · 0.47mm/px · 2 of 40 slices shown (1 of 2)]
[im 1/40]
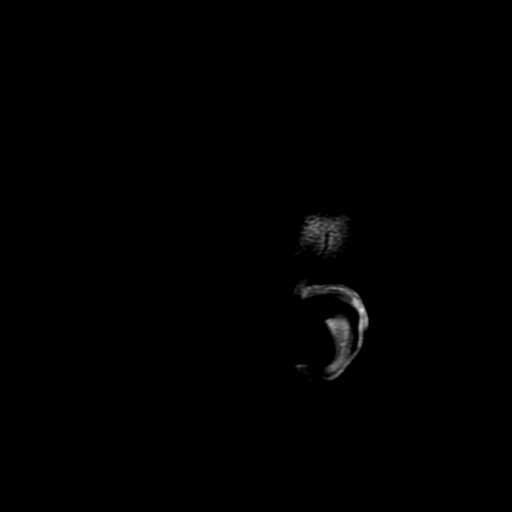
[im 40/40]
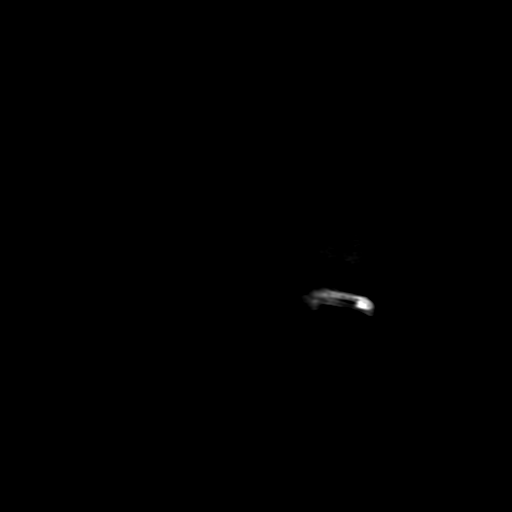

[Series 3: DWI · axial · 3.0mm · 0.94mm/px · z∈[-69,+68]mm · 3 of 94 slices shown (1 of 2)]
[im 1/94]
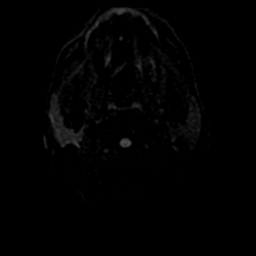
[im 47/94]
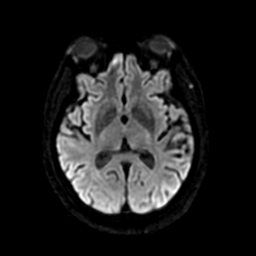
[im 94/94]
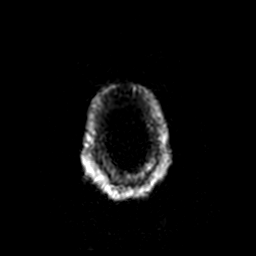

[Series 4: FLAIR · axial · 3.0mm · 0.47mm/px · 1 of 55 slices shown (2 of 2)]
[im 1/55]
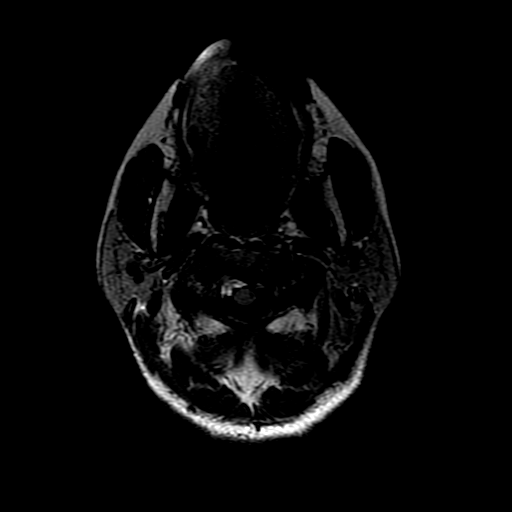

[Series 5: SWI · axial · 2.9mm · 0.47mm/px · z∈[-94,-13]mm · 2 of 110 slices shown]
[im 1/110]
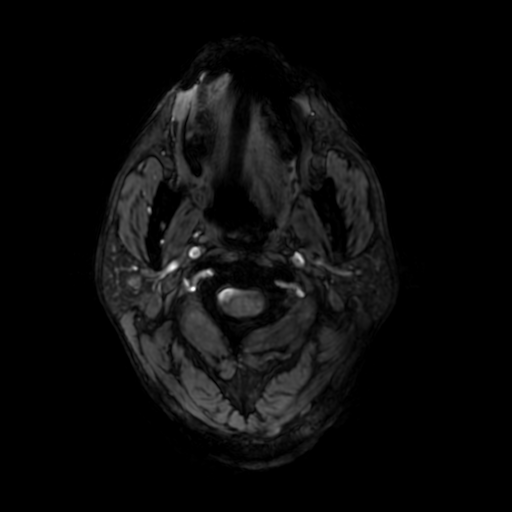
[im 55/110]
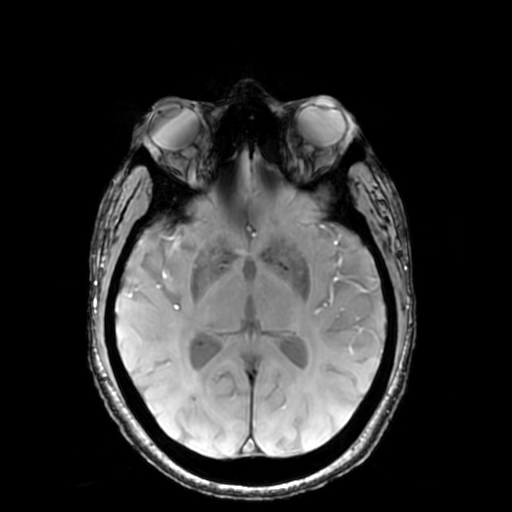

[Series 7: T2 post-contrast · coronal · 3.0mm · 0.39mm/px · 1 of 45 slices shown (1 of 2)]
[im 1/45]
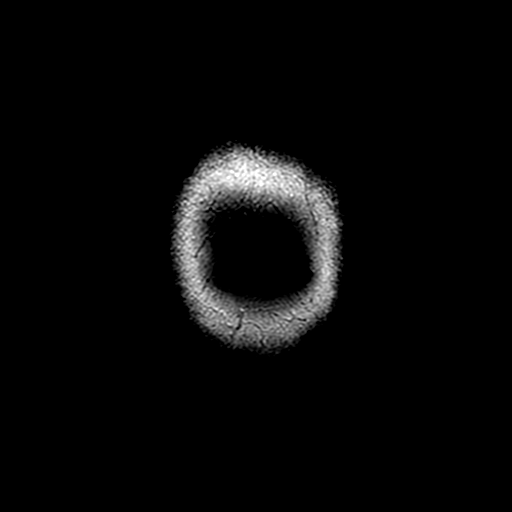

[Series 8: T2 post-contrast · axial · 5.0mm · 0.47mm/px · 1 of 24 slices shown (2 of 2)]
[im 1/24]
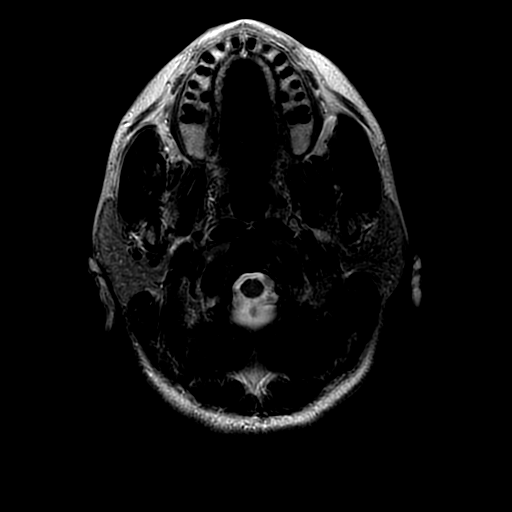

[Series 9: T1 post-contrast · coronal · 3.0mm · 0.43mm/px · 1 of 45 slices shown (1 of 2)]
[im 1/45]
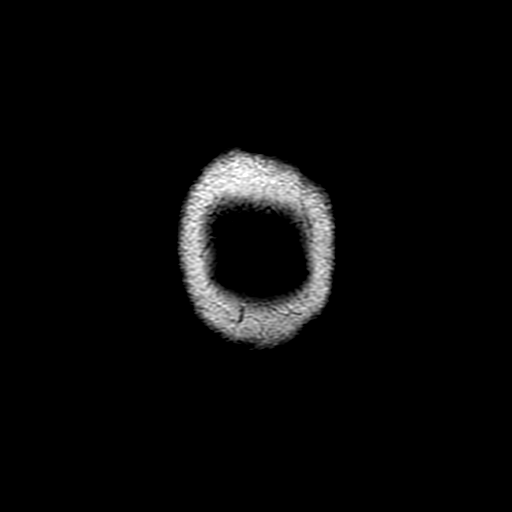

[Series 10: FLAIR post-contrast · sagittal · 3.0mm · 0.47mm/px · 1 of 40 slices shown]
[im 1/40]
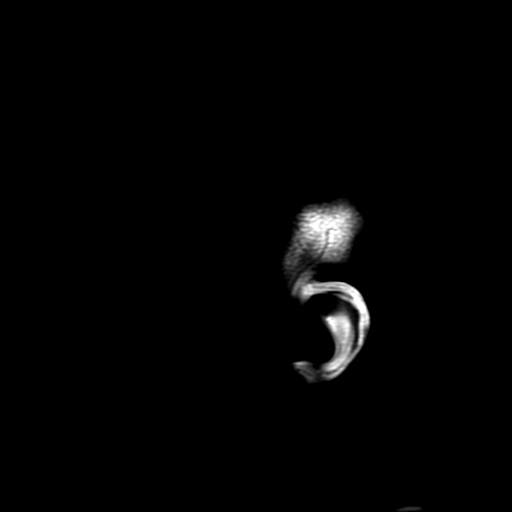

[Series 310: DWI · axial · 3.0mm · 0.94mm/px · z∈[-69,+68]mm · 2 of 94 slices shown (2 of 2)]
[im 1/94]
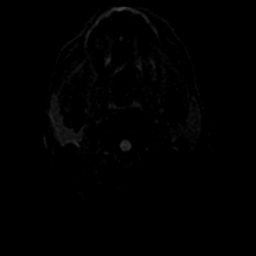
[im 94/94]
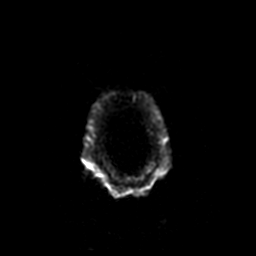

[Series 350: ADC · axial · 3.0mm · 0.94mm/px · 1 of 47 slices shown]
[im 1/47]
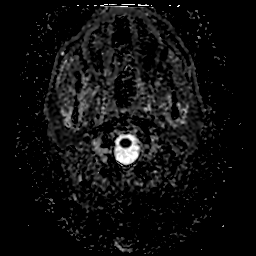

[Series 1100: T1 post-contrast · axial · 0.9mm · 0.50mm/px · z∈[-155,+100]mm · 8 of 301 slices shown (2 of 2)]
[im 1/301]
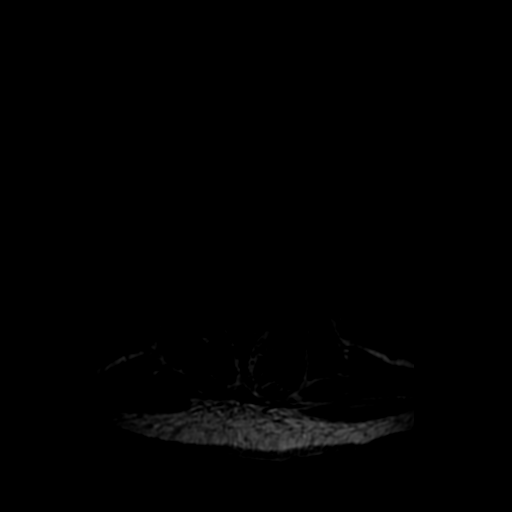
[im 43/301]
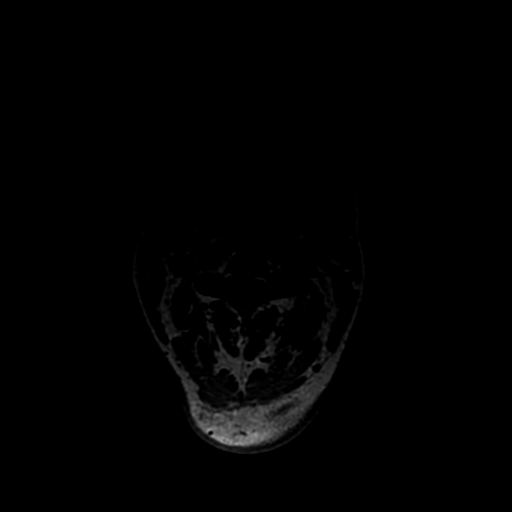
[im 86/301]
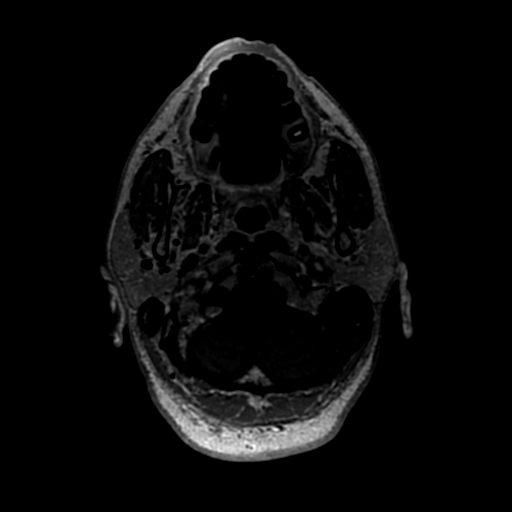
[im 129/301]
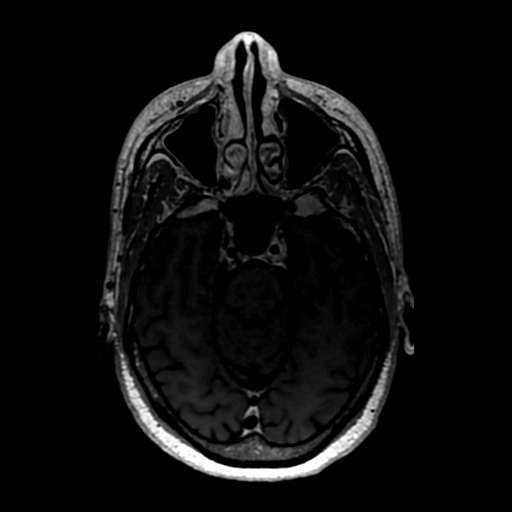
[im 172/301]
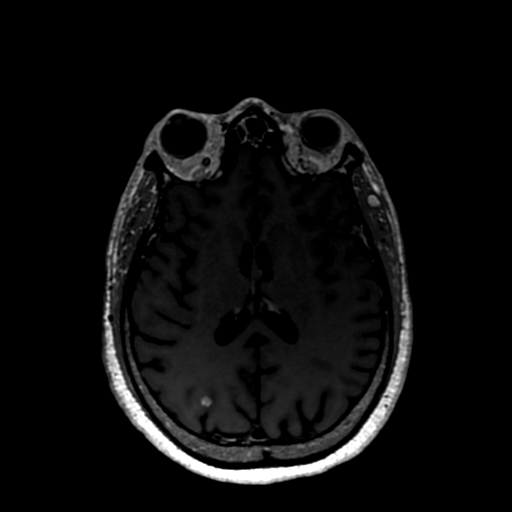
[im 215/301]
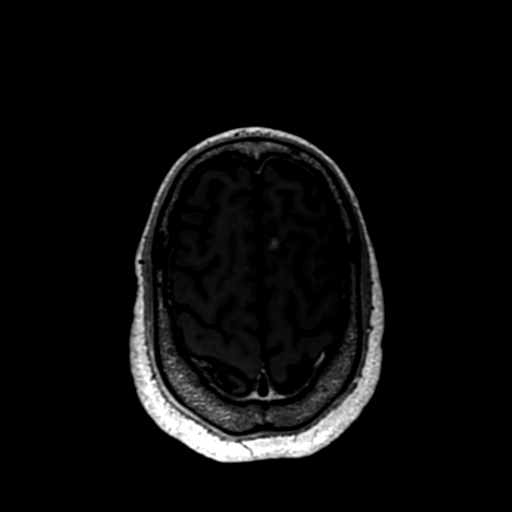
[im 258/301]
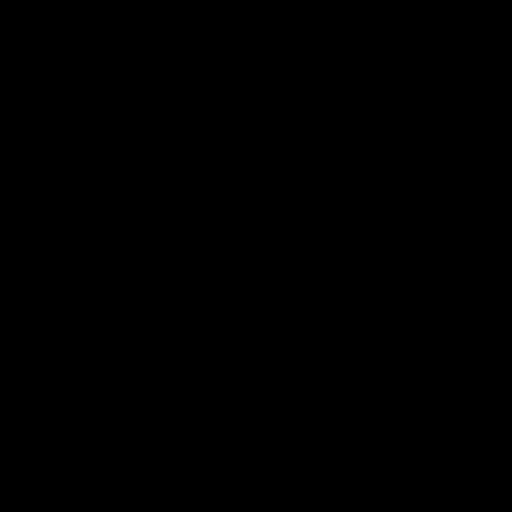
[im 301/301]
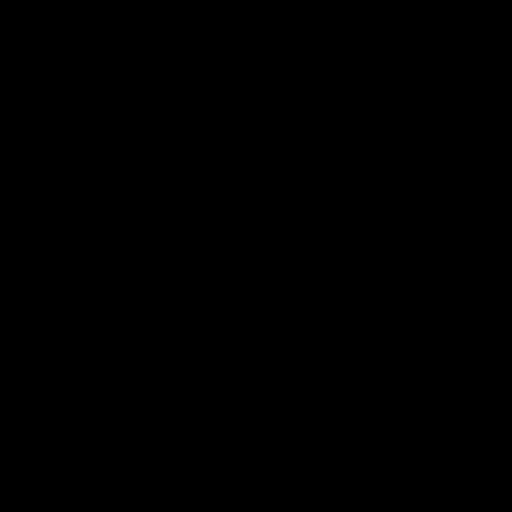

[23 of 48 positions shown; findings below may reference images not displayed]

FINDINGS: Brain: Multiple enhancing lesions the brain compatible with
metastatic disease are stable. No new or enlarging lesions.

7 mm ring-enhancing lesion right anterior temporal lobe unchanged.
Mild surrounding edema

4 mm enhancing lesion right parietal lobe axial postcontrast image
173 unchanged. Mild associated hemorrhage. No edema

Cluster of enhancing lesions in the right parietal lobe unchanged.
These show evidence of prior hemorrhage. Largest lesion is 7 mm

10 mm ring-enhancing lesion left frontal lobe axial image 220
unchanged. Associated chronic blood products unchanged.

Punctate enhancement left frontal cortex unchanged.  Axial image 226

Ill-defined enhancement left medial parietal lobe axial image 190 a
most likely is vascular enhancement based on sagittal and coronal
images. Attention on follow-up studies recommended.

Ventricle size normal.  No midline shift.  No acute infarct.

Vascular: Normal arterial flow voids.

Skull and upper cervical spine: No focal skeletal lesion

Sinuses/Orbits: Mild mucosal edema paranasal sinuses. Negative orbit

Other: None
IMPRESSION: Multiple enhancing brain metastasis are stable from the prior study.

Enhancement left medial parietal lobe most likely is vascular
enhancement with volume averaging. Attention at follow-up
recommended.

## 2022-11-05 ENCOUNTER — Ambulatory Visit: Payer: Medicare Other

## 2022-11-10 ENCOUNTER — Ambulatory Visit: Payer: Medicare Other | Attending: Internal Medicine | Admitting: Physical Therapy

## 2022-11-10 ENCOUNTER — Encounter: Payer: Self-pay | Admitting: Physical Therapy

## 2022-11-10 DIAGNOSIS — C7931 Secondary malignant neoplasm of brain: Secondary | ICD-10-CM | POA: Diagnosis present

## 2022-11-10 DIAGNOSIS — R262 Difficulty in walking, not elsewhere classified: Secondary | ICD-10-CM | POA: Diagnosis present

## 2022-11-10 DIAGNOSIS — M6281 Muscle weakness (generalized): Secondary | ICD-10-CM | POA: Diagnosis present
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
## 2022-11-12 ENCOUNTER — Ambulatory Visit: Payer: Medicare Other | Admitting: Physical Therapy

## 2022-11-16 ENCOUNTER — Encounter: Payer: Self-pay | Admitting: Physical Therapy

## 2022-11-16 ENCOUNTER — Ambulatory Visit: Payer: Medicare Other | Admitting: Physical Therapy

## 2022-11-16 DIAGNOSIS — R262 Difficulty in walking, not elsewhere classified: Secondary | ICD-10-CM

## 2022-11-16 DIAGNOSIS — C7931 Secondary malignant neoplasm of brain: Secondary | ICD-10-CM

## 2022-11-16 DIAGNOSIS — M6281 Muscle weakness (generalized): Secondary | ICD-10-CM
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
## 2022-11-19 ENCOUNTER — Encounter: Payer: Self-pay | Admitting: Physical Therapy

## 2022-11-19 ENCOUNTER — Ambulatory Visit: Payer: Medicare Other | Admitting: Physical Therapy

## 2022-11-19 DIAGNOSIS — M6281 Muscle weakness (generalized): Secondary | ICD-10-CM | POA: Diagnosis not present

## 2022-11-19 DIAGNOSIS — C7931 Secondary malignant neoplasm of brain: Secondary | ICD-10-CM

## 2022-11-19 DIAGNOSIS — R262 Difficulty in walking, not elsewhere classified: Secondary | ICD-10-CM
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
## 2022-11-20 ENCOUNTER — Telehealth: Payer: Self-pay | Admitting: *Deleted

## 2022-11-20 NOTE — Telephone Encounter (Signed)
Attempted to reach patient to advise that next weeks visit needs to be canceled and rescheduled once her MRI is completed.  I see that she was pending transportation coordination before she scheduled.   Left message pending call back.  Appt not canceled yet

## 2022-11-23 ENCOUNTER — Ambulatory Visit: Payer: Medicare Other | Admitting: Physical Therapy

## 2022-11-23 ENCOUNTER — Inpatient Hospital Stay: Payer: Medicare Other

## 2022-11-23 ENCOUNTER — Encounter: Payer: Self-pay | Admitting: Physical Therapy

## 2022-11-23 DIAGNOSIS — R262 Difficulty in walking, not elsewhere classified: Secondary | ICD-10-CM

## 2022-11-23 DIAGNOSIS — M6281 Muscle weakness (generalized): Secondary | ICD-10-CM | POA: Diagnosis not present

## 2022-11-23 DIAGNOSIS — C7931 Secondary malignant neoplasm of brain: Secondary | ICD-10-CM

## 2022-11-23 NOTE — Therapy (Signed)
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 training  [employer], PT 11/23/22 5:00 PM

## 2022-11-24 ENCOUNTER — Ambulatory Visit: Payer: Medicare Other | Admitting: Internal Medicine

## 2022-11-25 ENCOUNTER — Telehealth: Payer: Self-pay | Admitting: Internal Medicine

## 2022-11-25 NOTE — Telephone Encounter (Signed)
 Left patient a message in regards to scheduled appointment times/dates

## 2022-11-26 ENCOUNTER — Encounter: Payer: Self-pay | Admitting: Physical Therapy

## 2022-11-26 ENCOUNTER — Ambulatory Visit: Payer: Medicare Other | Admitting: Physical Therapy

## 2022-11-26 DIAGNOSIS — C7931 Secondary malignant neoplasm of brain: Secondary | ICD-10-CM

## 2022-11-26 DIAGNOSIS — R262 Difficulty in walking, not elsewhere classified: Secondary | ICD-10-CM

## 2022-11-26 DIAGNOSIS — M6281 Muscle weakness (generalized): Secondary | ICD-10-CM | POA: Diagnosis not present

## 2022-11-26 NOTE — Therapy (Signed)
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 periods and hasn't progressed much lately because her air has been out.  3.  Pt will demonstrate 3+/5 bilateral glute strength to decrease fall risk.  Baseline:  2+ at eval, 10/01/22 - 3/5, 10/29/22 3/5 Goal status: INITIAL  4.  Pt will demonstrate 4/5 bilateral hip flexion strength to decrease fall risk  Baseline:  Goal status: IN PROGRESS 09/07/22: 09/07/22- 3+/5, 10/01/22: 4-/5  10/29/2022 4- B  5.  Pt will be independent in a home exercise program for continued strengthening.  Baseline:  Goal status: IN PROGRESS7/25/2024  6. Pt will be  able to go up stairs at home with decreased fatigue   Status NEW  7. Pt will be able to maintain  SLS on ax x  12 seconds on ea side   Status: NEW  PLAN:  PT FREQUENCY: 2x/week  PT DURATION: 4 weeks  PLANNED INTERVENTIONS: Therapeutic exercises, Therapeutic activity, Neuromuscular re-education, Gait training, Patient/Family education, Self Care, Manual therapy, and Re-evaluation  PLAN FOR NEXT SESSION: update POC, practice getting up off the floor,  cont  obstacle course, add reaching outside of BOS, Cont LE strengthening, NuStep, // bars hip 3 way, focus on hip strength and hamstrings, high level balance, UE strength, stair training  [employer], PT 11/26/22 3:03 PM

## 2022-12-15 ENCOUNTER — Ambulatory Visit: Payer: Medicare Other | Attending: Internal Medicine | Admitting: Physical Therapy

## 2022-12-15 ENCOUNTER — Encounter: Payer: Self-pay | Admitting: Physical Therapy

## 2022-12-15 DIAGNOSIS — M6281 Muscle weakness (generalized): Secondary | ICD-10-CM | POA: Diagnosis present

## 2022-12-15 DIAGNOSIS — R262 Difficulty in walking, not elsewhere classified: Secondary | ICD-10-CM | POA: Insufficient documentation

## 2022-12-15 DIAGNOSIS — C7931 Secondary malignant neoplasm of brain: Secondary | ICD-10-CM | POA: Diagnosis present

## 2022-12-15 NOTE — Therapy (Signed)
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 GOALS: Target date: 09/01/22  Pt will be able to complete 12 sit to stands without use of UEs in 30 sec to decrease fall risk.  Baseline:  Goal status: IN PROGRESS 10/01/22- 8 reps without use of UEs, 10/29/2023 8 reps; 12/15/22 - 8 reps  2.  Pt will report she is able to clean her house without recovery periods to allow her to return to prior level of function. Baseline:  Goal status: IN PROGRESS 10/01/22- pt reports she is doing slightly  better with this but limited by dizziness and requires recovery; 09/07/22- pt reports she can get a little more done in between recovery periods, 10/29/2022 pt still takes recovery periods and hasn't progressed much lately because her air has been out. 12/15/22- pt reports this is getting better but still requiring recovery periods  3.  Pt will demonstrate 3+/5 bilateral glute strength to decrease fall risk.  Baseline:  2+ at eval, 10/01/22 - 3/5, 10/29/22 3/5; 12/15/22 3/5 Goal status: IN PROGRESS  4.  Pt will demonstrate 4/5 bilateral hip flexion  strength to decrease fall risk  Baseline:  Goal status: MET 09/07/22: 09/07/22- 3+/5, 10/01/22: 4-/5  10/29/2022 4- ; 12/15/22: 4/5 MET  5.  Pt will be independent in a home exercise program for continued strengthening.  Baseline:  Goal status: IN PROGRESS7/25/2024  6. Pt will be able to go up stairs at home with decreased fatigue   Status: 12/15/22: pt reports it is improving, has to focus on step through pattern  7. Pt will be able to maintain  SLS on ax x  12 seconds on ea side   Status: 12/15/22: MET about to do greater than 12 sec bilaterally  8. Pt will be educated in stretching and strengthening exercises that she can continue at a gym for long term management. Status: NEW   PLAN:  PT FREQUENCY: 2x/week  PT DURATION: 3 weeks  PLANNED INTERVENTIONS: Therapeutic exercises, Therapeutic activity, Neuromuscular re-education, Gait training, Patient/Family education, Self Care, Manual therapy, and Re-evaluation  PLAN FOR NEXT SESSION: getting up off the floor,  cont obstacle course, add reaching outside of BOS, Cont LE strengthening, NuStep, // bars hip 3 way, focus on hip strength and hamstrings, high level balance, UE strength, stair training  [employer], PT 12/15/22 2:03 PM

## 2022-12-17 ENCOUNTER — Ambulatory Visit: Payer: Medicare Other | Admitting: Physical Therapy

## 2022-12-17 ENCOUNTER — Encounter: Payer: Self-pay | Admitting: Physical Therapy

## 2022-12-17 DIAGNOSIS — M6281 Muscle weakness (generalized): Secondary | ICD-10-CM

## 2022-12-17 DIAGNOSIS — R262 Difficulty in walking, not elsewhere classified: Secondary | ICD-10-CM

## 2022-12-17 DIAGNOSIS — C7931 Secondary malignant neoplasm of brain: Secondary | ICD-10-CM

## 2022-12-17 NOTE — Therapy (Signed)
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 8 reps with controlled descent  2.  Pt will be independent in initial HEP for strengthening bilateral LEs Baseline:  Goal status: MET 09/07/22   LONG TERM GOALS: Target date: 09/01/22  Pt will be able to complete 12 sit to stands without use of UEs in 30 sec to decrease fall risk.  Baseline:  Goal status: IN PROGRESS 10/01/22- 8 reps without use of UEs, 10/29/2023 8 reps; 12/15/22 - 8 reps  2.  Pt will report  she is able to clean her house without recovery periods to allow her to return to prior level of function. Baseline:  Goal status: IN PROGRESS 10/01/22- pt reports she is doing slightly better with this but limited by dizziness and requires recovery; 09/07/22- pt reports she can get a little more done in between recovery periods, 10/29/2022 pt still takes recovery periods and hasn't progressed much lately because her air has been out. 12/15/22- pt reports this is getting better but still requiring recovery periods  3.  Pt will demonstrate 3+/5 bilateral glute strength  to decrease fall risk.  Baseline:  2+ at eval, 10/01/22 - 3/5, 10/29/22 3/5; 12/15/22 3/5 Goal status: IN PROGRESS  4.  Pt will demonstrate 4/5 bilateral hip flexion strength to decrease fall risk  Baseline:  Goal status: MET 09/07/22: 09/07/22- 3+/5, 10/01/22: 4-/5  10/29/2022 4- ; 12/15/22: 4/5 MET  5.  Pt will be independent in a home exercise program for continued strengthening.  Baseline:  Goal status: IN PROGRESS7/25/2024  6. Pt will be able to go up stairs at home with decreased fatigue   Status: 12/15/22: pt reports it is improving, has to focus on step through pattern  7. Pt will be able to maintain  SLS on ax x  12 seconds on ea side   Status: 12/15/22: MET about to do greater than 12 sec bilaterally  8. Pt will be educated in stretching and strengthening exercises that she can continue at a gym for long term management. Status: NEW   9. Pt will be able to reach up overhead and hold her arms up to allow her to fix her hair.  Status: NEW  PLAN:  PT FREQUENCY: 2x/week  PT DURATION: 3 weeks  PLANNED INTERVENTIONS: Therapeutic exercises, Therapeutic activity, Neuromuscular re-education, Gait training, Patient/Family education, Self Care, Manual therapy, and Re-evaluation  PLAN FOR NEXT SESSION: getting up off the floor,  cont obstacle course, add reaching outside of BOS, Cont LE strengthening, NuStep, // bars hip 3 way, focus on hip strength and hamstrings, high level balance, UE strength, stair training  Leonette Most, PT 12/17/22 2:51 PM

## 2022-12-21 ENCOUNTER — Encounter: Payer: Self-pay | Admitting: Physical Therapy

## 2022-12-21 ENCOUNTER — Ambulatory Visit: Payer: Medicare Other | Admitting: Physical Therapy

## 2022-12-21 DIAGNOSIS — M6281 Muscle weakness (generalized): Secondary | ICD-10-CM | POA: Diagnosis not present

## 2022-12-21 DIAGNOSIS — R262 Difficulty in walking, not elsewhere classified: Secondary | ICD-10-CM

## 2022-12-21 DIAGNOSIS — C7931 Secondary malignant neoplasm of brain: Secondary | ICD-10-CM

## 2022-12-21 NOTE — Therapy (Signed)
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 level  PARTICIPATION LIMITATIONS: meal prep, cleaning, laundry, shopping, community activity, occupation, and yard work  PERSONAL FACTORS: Fitness and Time since onset of injury/illness/exacerbation are also affecting patient's functional outcome.   REHAB POTENTIAL: Good  CLINICAL DECISION MAKING: Evolving/moderate complexity  EVALUATION  COMPLEXITY: Moderate  GOALS: Goals reviewed with patient? Yes  SHORT TERM GOALS: Target date: 08/18/22  Pt will be able to complete 8 sit to stands in 30 sec to decrease fall risk. Baseline: Goal status: MET 09/07/22- was able to complete 8 reps with controlled descent  2.  Pt will be independent in initial HEP for strengthening bilateral LEs Baseline:  Goal status: MET 09/07/22   LONG TERM GOALS: Target date: 09/01/22  Pt will be able to complete 12 sit to stands without use of UEs in 30 sec to decrease fall risk.  Baseline:  Goal status: IN PROGRESS 10/01/22- 8 reps without use of UEs, 10/29/2023 8 reps; 12/15/22 - 8 reps  2.  Pt will  report she is able to clean her house without recovery periods to allow her to return to prior level of function. Baseline:  Goal status: IN PROGRESS 10/01/22- pt reports she is doing slightly better with this but limited by dizziness and requires recovery; 09/07/22- pt reports she can get a little more done in between recovery periods, 10/29/2022 pt still takes recovery periods and hasn't progressed much lately because her air has been out. 12/15/22- pt reports this is getting better but still requiring recovery periods  3.  Pt will demonstrate 3+/5 bilateral glute strength to decrease fall risk.  Baseline:  2+ at eval, 10/01/22 - 3/5, 10/29/22 3/5; 12/15/22 3/5 Goal status: IN PROGRESS  4.  Pt will demonstrate 4/5 bilateral hip flexion strength to decrease fall risk  Baseline:  Goal status: MET 09/07/22: 09/07/22- 3+/5, 10/01/22: 4-/5  10/29/2022 4- ; 12/15/22: 4/5 MET  5.  Pt will be independent in a home exercise program for continued strengthening.  Baseline:  Goal status: IN PROGRESS7/25/2024  6. Pt will be able to go up stairs at home with decreased fatigue   Status: 12/15/22: pt reports it is improving, has to focus on step through pattern  7. Pt will be able to maintain  SLS on ax x  12 seconds on ea side   Status: 12/15/22: MET about to do greater than 12 sec bilaterally  8. Pt will be educated in stretching and strengthening exercises that she can continue at a gym for long term management. Status: NEW   9. Pt will be able to reach up overhead and hold her arms up to allow her to fix her hair.  Status: NEW  PLAN:  PT FREQUENCY: 2x/week  PT DURATION: 3 weeks  PLANNED INTERVENTIONS: Therapeutic exercises, Therapeutic activity, Neuromuscular re-education, Gait training, Patient/Family education, Self Care, Manual therapy, and Re-evaluation  PLAN FOR NEXT SESSION: getting up off the floor,  cont obstacle course, add reaching outside of BOS, Cont LE strengthening, NuStep, // bars hip 3 way,  focus on hip strength and hamstrings, high level balance, UE strength, stair training  Leonette Most, PT 12/21/22 3:59 PM

## 2022-12-24 ENCOUNTER — Ambulatory Visit: Payer: Medicare Other | Admitting: Physical Therapy

## 2022-12-28 ENCOUNTER — Ambulatory Visit: Payer: Medicare Other | Admitting: Physical Therapy

## 2022-12-28 ENCOUNTER — Encounter: Payer: Self-pay | Admitting: Physical Therapy

## 2022-12-28 DIAGNOSIS — R262 Difficulty in walking, not elsewhere classified: Secondary | ICD-10-CM

## 2022-12-28 DIAGNOSIS — M6281 Muscle weakness (generalized): Secondary | ICD-10-CM | POA: Diagnosis not present

## 2022-12-28 DIAGNOSIS — C7931 Secondary malignant neoplasm of brain: Secondary | ICD-10-CM

## 2022-12-28 NOTE — Therapy (Signed)
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 descent Standing 3 way hip with red band  ASSESSMENT:  CLINICAL IMPRESSION:  Pt has started exercising at the gym recently. She had  a few questions about the equipment that therapist answered today. Educated pt to reach out to [employer] to see when she will be able to start there. Continued with exercises today and educating pt about equipment and set up to progress pt towards independence with exercise at the gym.   OBJECTIVE IMPAIRMENTS: decreased activity tolerance, difficulty walking, decreased strength, and postural dysfunction.   ACTIVITY LIMITATIONS: carrying, standing, stairs, bed mobility, and locomotion level  PARTICIPATION LIMITATIONS: meal prep, cleaning, laundry, shopping, community activity, occupation, and yard work  PERSONAL FACTORS: Fitness and Time since onset of injury/illness/exacerbation are also affecting patient's functional outcome.   REHAB POTENTIAL: Good  CLINICAL DECISION MAKING: Evolving/moderate complexity  EVALUATION COMPLEXITY:  Moderate  GOALS: Goals reviewed with patient? Yes  SHORT TERM GOALS: Target date: 08/18/22  Pt will be able to complete 8 sit to stands in 30 sec to decrease fall risk. Baseline: Goal status: MET 09/07/22- was able to complete 8 reps with controlled descent  2.  Pt will be independent in initial HEP for strengthening bilateral LEs Baseline:  Goal status: MET 09/07/22   LONG TERM GOALS: Target date: 09/01/22  Pt will be able to complete 12 sit to stands without use of UEs in 30 sec to decrease fall risk.  Baseline:  Goal status: IN PROGRESS 10/01/22- 8 reps without use of UEs, 10/29/2023 8 reps; 12/15/22 - 8 reps  2.  Pt will report she is able to clean her house without recovery periods to allow her to return to prior level of function. Baseline:  Goal status: IN PROGRESS 10/01/22- pt reports she is doing slightly better with this but limited by dizziness and requires recovery; 09/07/22- pt reports she can get a little more done in between recovery periods, 10/29/2022 pt still takes recovery periods and hasn't progressed much lately because her air has been out. 12/15/22- pt reports this is getting better but still requiring recovery periods  3.  Pt will demonstrate 3+/5 bilateral glute strength to decrease fall risk.  Baseline:  2+ at eval, 10/01/22 - 3/5, 10/29/22 3/5; 12/15/22 3/5 Goal status: IN PROGRESS  4.  Pt will demonstrate 4/5 bilateral hip flexion strength to decrease fall risk  Baseline:  Goal status: MET 09/07/22: 09/07/22- 3+/5, 10/01/22: 4-/5  10/29/2022 4- ; 12/15/22: 4/5 MET  5.  Pt will be independent in a home exercise program for continued strengthening.  Baseline:  Goal status: IN PROGRESS7/25/2024  6. Pt will be able to go up stairs at home with decreased fatigue   Status: 12/15/22: pt reports it is improving, has to focus on step through pattern  7. Pt will be able to maintain  SLS on ax x  12 seconds on ea side   Status: 12/15/22: MET about to do greater than 12 sec  bilaterally  8. Pt will be educated in stretching and strengthening exercises that she can continue at a gym for long term management. Status: NEW   9. Pt will be able to reach up overhead and hold her arms up to allow her to fix her hair.  Status: NEW  PLAN:  PT FREQUENCY: 2x/week  PT DURATION: 3 weeks  PLANNED INTERVENTIONS: Therapeutic exercises, Therapeutic activity, Neuromuscular re-education, Gait training, Patient/Family education, Self Care, Manual therapy, and Re-evaluation  PLAN FOR NEXT SESSION: getting up off the floor,  cont obstacle course, add reaching outside of BOS, Cont LE strengthening, NuStep, // bars hip 3 way, focus on hip strength and hamstrings, high level balance, UE strength, stair  training  Milagros Loll [place], PT 12/28/22 1:04 PM

## 2022-12-29 ENCOUNTER — Encounter: Payer: Self-pay | Admitting: Hematology and Oncology

## 2022-12-29 ENCOUNTER — Encounter: Payer: Self-pay | Admitting: Physician Assistant

## 2022-12-29 ENCOUNTER — Encounter: Payer: Self-pay | Admitting: Emergency Medicine

## 2022-12-29 ENCOUNTER — Ambulatory Visit: Payer: Medicare Other | Admitting: Emergency Medicine

## 2022-12-29 VITALS — BP 106/78 | HR 83 | Ht 65.0 in | Wt 135.8 lb

## 2022-12-29 DIAGNOSIS — C7931 Secondary malignant neoplasm of brain: Secondary | ICD-10-CM

## 2022-12-29 DIAGNOSIS — C3491 Malignant neoplasm of unspecified part of right bronchus or lung: Secondary | ICD-10-CM | POA: Diagnosis not present

## 2022-12-29 DIAGNOSIS — J4489 Other specified chronic obstructive pulmonary disease: Secondary | ICD-10-CM | POA: Diagnosis not present

## 2022-12-29 NOTE — Patient Instructions (Addendum)
Please continue Dulera 2 puffs twice a day.  Rinse and gargle after using.  We will get you a spacer to use with your inhaler Keep your albuterol available to use 2 puffs if needed for shortness of breath, chest tightness, wheezing. Continue your Astelin nasal spray and Allegra as you have been taking them Get your MRI brain and follow with oncology as planned Continue physical therapy Agree with starting pulmonary rehab after you have finished with your physical therapy Follow with Dr Delton Coombes in 6 months or sooner if you have any problems

## 2022-12-29 NOTE — Assessment & Plan Note (Signed)
Please continue Dulera 2 puffs twice a day.  Rinse and gargle after using.  We will get you a spacer to use with your inhaler Keep your albuterol available to use 2 puffs if needed for shortness of breath, chest tightness, wheezing. Continue your Astelin nasal spray and Allegra as you have been taking them Continue physical therapy Agree with starting pulmonary rehab after you have finished with your physical therapy Follow with Dr Delton Coombes in 6 months or sooner if you have any problems

## 2022-12-29 NOTE — Assessment & Plan Note (Signed)
Get your MRI brain and follow with oncology as planned

## 2022-12-29 NOTE — Progress Notes (Signed)
Subjective:    Patient ID: Elizabeth Mayer, female    DOB: 08/13/1960, 62 y.o.   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  She has chronic hypoxemic respiratory failure on 2 L/min with exertion and while sleeping.  She had a stable MRI brain 07/23/2022.  She did not desaturate on an ambulatory oximetry at our last visit in January.  Today she reports Allegra, Astelin nasal spray.  Has been managed on Symbicort, ran out. She believes that she was benefiting from symbicort. She is using albuterol about 2-3x a day, does benefit. She is having some SOB w exertion, some SOB when talking. Her voice is hoarse.   CT chest/abdomen/pelvis 05/19/2022 reviewed by me shows no new mass lesions or fluid collections, no new lymphadenopathy.  Posttreatment changes at the right apex.  ROV 12/29/2022 --62 year old woman with COPD/asthma, stage IV adenocarcinoma (lung, brain mets with history of postobstructive pneumonia).  She has unilateral VC paralysis. She has hypoxemic respiratory failure on 2 L/min with exertion and while sleeping. We had to change her Symbicort to [employer] in May due to insurance. She is doing pretty well. She does have dyspnea with exercise - she is doing PT. She is interested in doing [employer]. She is due for repeat MRI brain soon. Uses albuterol about once a day. She has a daily dry cough. On astelin, allegra    Review of Systems As per HPI     Objective:   Physical Exam  Today's Vitals   12/29/22 1315  BP: 106/78  Pulse: 83  SpO2: 100%  Weight: 135 lb 12.8 oz (61.6 kg)  Height: 5\' 5"  (1.651 m)    Body mass index is 22.6 kg/m.;sm  Gen: Pleasant, well-nourished, in no distress,  normal affect  ENT: No lesions,  mouth clear,  oropharynx clear, no postnasal drip, soft voice, hoarse voice  Neck: No JVD, no  stridor  Lungs: No use of accessory muscles, no crackles or wheezing on normal respiration, no wheeze on forced expiration  Cardiovascular: RRR, heart sounds normal, no murmur or gallops, no peripheral edema  Musculoskeletal: No deformities, no cyanosis or clubbing  Neuro: alert, awake, non focal  Skin: Warm, no lesions or rash     Assessment & Plan:  Malignant neoplasm metastatic to brain [facility]) Get your MRI brain and follow with oncology as planned   Asthma with COPD (HCC) Please continue Dulera 2 puffs twice a day.  Rinse and gargle after using.  We will get you a spacer to use with your inhaler Keep your albuterol available to use 2 puffs if needed for shortness of breath, chest tightness, wheezing. Continue your Astelin nasal spray and Allegra as you have been taking them Continue physical therapy Agree with starting pulmonary rehab after you have finished with your physical therapy Follow with Dr Delton Coombes in 6 months or sooner if you have any problems  Non-small cell lung cancer, right [facility]) Follow-up with oncology as planned   Levy Pupa, MD, PhD 12/29/2022, 2:04 PM [facility] [phone] or if no answer [phone]

## 2022-12-29 NOTE — Assessment & Plan Note (Signed)
Follow up with oncology as planned

## 2022-12-31 ENCOUNTER — Ambulatory Visit: Payer: Medicare Other | Admitting: Physical Therapy

## 2022-12-31 ENCOUNTER — Encounter: Payer: Self-pay | Admitting: Physical Therapy

## 2022-12-31 DIAGNOSIS — R262 Difficulty in walking, not elsewhere classified: Secondary | ICD-10-CM

## 2022-12-31 DIAGNOSIS — M6281 Muscle weakness (generalized): Secondary | ICD-10-CM

## 2022-12-31 DIAGNOSIS — C7931 Secondary malignant neoplasm of brain: Secondary | ICD-10-CM

## 2022-12-31 NOTE — Therapy (Signed)
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 ankle weights and v/c and t/c cues to avoid substitution Seated edge of mat table for bil HS and then piriformis stretch 2x, 30 sec each with good stretches felt except for L hamstrings which was not tight Instructed pt in runner stretch at wall to decrease tightness in posterior knee x 2 reps with 60 sec holds O2 100%, HR 84 at end of session    PATIENT EDUCATION:  Education details: Bil LE strength and stability Person educated: Patient Education method: Explanation, Demonstration and Handout issued Education comprehension: verbalized understanding, returned demonstration and will benefit from further review  HOME EXERCISE PROGRAM: Sit to stands from chair without use of UEs controlling descent Standing 3 way hip with red band  ASSESSMENT:  CLINICAL IMPRESSION:  Pt is now a member at [employer] and is now familiar with the equipment. She has started to do a few of the machines. She will transition to working out at [employer] to continue to progress her strength. She is still waiting to  hear back from [employer] but plans on continuing with [employer] if she does not hear back. She has progressed greatly since her eval. At eval she could only do 5 sit to stands in 30 secs and now is able to complete 9 without use of UEs. She is able to get herself off of the floor independently which was one of her goals. Her LE strength has improved greatly. She will be discharged from skilled PT services at this time.   OBJECTIVE IMPAIRMENTS: decreased activity tolerance, difficulty walking, decreased strength, and postural dysfunction.   ACTIVITY LIMITATIONS: carrying, standing, stairs, bed mobility, and locomotion level  PARTICIPATION LIMITATIONS: meal prep, cleaning, laundry, shopping, community activity, occupation, and yard work  PERSONAL FACTORS: Fitness and Time since onset of injury/illness/exacerbation are also affecting patient's functional outcome.   REHAB POTENTIAL: Good  CLINICAL DECISION MAKING: Evolving/moderate complexity  EVALUATION COMPLEXITY: Moderate  GOALS: Goals reviewed with patient? Yes  SHORT TERM GOALS: Target date: 08/18/22  Pt will be able to complete 8 sit to stands in 30 sec to decrease fall risk. Baseline: Goal status: MET 09/07/22- was able to complete 8 reps with controlled descent  2.  Pt will be independent in initial HEP for strengthening bilateral LEs Baseline:  Goal status: MET 09/07/22   LONG TERM GOALS: Target date: 09/01/22  Pt will be able to complete 12 sit to stands without use of UEs in 30 sec to decrease fall risk.  Baseline:  Goal status: NOT MET 12/31/22: 9 reps; 10/01/22- 8 reps without use of UEs, 10/29/2023 8 reps; 12/15/22 - 8 reps  2.  Pt will report she is able to clean her house without recovery periods to allow her to return to prior level of function. Baseline:  Goal status: NOT MET 10/01/22- pt reports she is doing slightly better with this but limited by dizziness and requires recovery; 09/07/22- pt reports she can get a  little more done in between recovery periods, 10/29/2022 pt still takes recovery periods and hasn't progressed much lately because her air has been out. 12/15/22- pt reports this is getting better but still requiring recovery periods; 12/31/22- still requiring recovery periods  3.  Pt will demonstrate 3+/5 bilateral glute strength to decrease fall risk.  Baseline:  2+ at eval, 10/01/22 - 3/5, 10/29/22 3/5; 12/15/22 3/5; 12/31/22: 3/5 Goal status: NOT MET  4.  Pt will demonstrate 4/5 bilateral hip flexion strength to decrease fall risk  Baseline:  Goal status: MET 09/07/22: 09/07/22- 3+/5, 10/01/22: 4-/5  10/29/2022 4- ; 12/15/22: 4/5 MET  5.  Pt will be independent in a home exercise program for continued strengthening.  Baseline:  Goal status: MET 12/31/22  6. Pt will be able to go up stairs at home with decreased fatigue   Status: MET 12/31/22, 12/15/22: pt reports it is improving, has to focus on step through pattern  7. Pt will be able to maintain  SLS on ax x  12 seconds on ea side   Status: 12/15/22: MET about to do greater than 12 sec bilaterally  8. Pt will be educated in stretching and strengthening exercises that she can continue at a gym for long term management. Status: MET 12/31/22  9. Pt will be able to reach up overhead and hold her arms up to allow her to fix her hair.  Status: NOT MET 12/31/22- pt reports she is still unable to do this but that her arm strength has improved some  PLAN:  PT FREQUENCY: 2x/week  PT DURATION: 3 weeks  PLANNED INTERVENTIONS: Therapeutic exercises, Therapeutic activity, Neuromuscular re-education, Gait training, Patient/Family education, Self Care, Manual therapy, and Re-evaluation  PLAN FOR NEXT SESSION: getting up off the floor,  cont obstacle course, add reaching outside of BOS, Cont LE strengthening, NuStep, // bars hip 3 way, focus on hip strength and hamstrings, high level balance, UE strength, stair training  Leonette Most, PT 12/31/22 2:51  PM   PHYSICAL THERAPY DISCHARGE SUMMARY  Visits from Start of Care: 31  Current functional level related to goals / functional outcomes: See above   Remaining deficits: See above, still requires recovery periods and may continue to due to diagnosis of COPD   Education / Equipment: HEP, how to use gym equipment, how to progress exercises, how to pace exercise with COPD   Patient agrees to discharge. Patient goals were partially met. Patient is being discharged due to being pleased with the current functional level.  Milagros Loll [place], [place] 12/31/22 2:57 PM

## 2023-01-05 ENCOUNTER — Ambulatory Visit
Admission: RE | Admit: 2023-01-05 | Discharge: 2023-01-05 | Disposition: A | Discharge status: Home / Self Care | Payer: Medicare Other | Source: Ambulatory Visit | Attending: Internal Medicine

## 2023-01-05 DIAGNOSIS — C7931 Secondary malignant neoplasm of brain: Secondary | ICD-10-CM

## 2023-01-05 MED ORDER — GADOPICLENOL 0.5 MMOL/ML IV SOLN
7.0000 mL | Freq: Once | INTRAVENOUS | Status: AC | PRN
  Administered 2023-01-05: 7 mL via INTRAVENOUS

## 2023-01-18 ENCOUNTER — Inpatient Hospital Stay: Payer: Medicare Other | Attending: Internal Medicine | Admitting: Internal Medicine

## 2023-01-18 VITALS — BP 123/66 | HR 87 | Temp 98.0°F | Resp 16 | Wt 134.3 lb

## 2023-01-18 DIAGNOSIS — C3491 Malignant neoplasm of unspecified part of right bronchus or lung: Secondary | ICD-10-CM | POA: Insufficient documentation

## 2023-01-18 DIAGNOSIS — R519 Headache, unspecified: Secondary | ICD-10-CM | POA: Diagnosis not present

## 2023-01-18 DIAGNOSIS — C7931 Secondary malignant neoplasm of brain: Secondary | ICD-10-CM

## 2023-01-18 DIAGNOSIS — Z9221 Personal history of antineoplastic chemotherapy: Secondary | ICD-10-CM | POA: Insufficient documentation

## 2023-01-18 DIAGNOSIS — Z87891 Personal history of nicotine dependence: Secondary | ICD-10-CM | POA: Insufficient documentation

## 2023-01-18 NOTE — Progress Notes (Signed)
[facility] at [facility] [address]  [place], [place] [address] [phone]   Interval Evaluation  Date of Service: 01/18/23 Patient Name: Elizabeth Mayer Patient MRN: [id number] Patient DOB: Apr 13, 1960 Provider: Henreitta Leber, MD  Identifying Statement:  Elizabeth Mayer is a 62 y.o. female with Malignant neoplasm metastatic to brain [facility]) [C79.31]   Primary Cancer:  Oncologic History: Oncology History  Non-small cell lung cancer, right (HCC)  11/08/2019 Initial Diagnosis   Non-small cell lung cancer, right (HCC)   12/14/2019 - 12/14/2019 Chemotherapy   The patient had cemiplimab-rwlc (LIBTAYO) 350 mg in sodium chloride 0.9 % 100 mL chemo infusion, 350 mg, Intravenous, Once, 0 of 6 cycles  for chemotherapy treatment.    12/14/2019 - 09/25/2021 Chemotherapy   Patient is on Treatment Plan : LUNG CARBOplatin / Pemetrexed / Pembrolizumab q21d Induction x 4 cycles / Maintenance Pemetrexed + Pembrolizumab     12/14/2019 -  Chemotherapy   Patient is on Treatment Plan : LUNG Carboplatin (5) + Pemetrexed (500) + Pembrolizumab (200) D1 q21d Induction x 4 cycles / Maintenance Pemetrexed (500) + Pembrolizumab (200) D1 q21d      CNS Oncologic History 01/12/20: SRS to 22 targets Elizabeth Mayer) 02/11/21: SRS L frontal Elizabeth Mayer)  Interval History:  Elizabeth Mayer presents today for follow up after recent MRI brain.  No clinical changes reported today, denies left sided weakness.  Headaches are sporadic, denies seizures.  Continues to follow with Dr. Leonides Schanz.  H+P (03/14/20) Patient presents today for follow up after recent MRI brain.  She describes no new or progressive neurologic deficits.  No seizures or headaches.  Still independent with gait and activities of daily living.  Continues on chemotherapy with Dr. Arbutus Ped.  Currently taking decadron 1mg  daily.  Medications: Current Outpatient Medications on File Prior to Visit  Medication Sig Dispense Refill   acetaminophen  (TYLENOL) 500 MG tablet Take 1,000 mg by mouth every 6 (six) hours as needed for moderate pain.     ALPRAZolam (XANAX) 1 MG tablet Take 1 tablet (1 mg total) by mouth once as needed for up to 1 dose (mri claustrophobia). Take 45 minutes before MRI study 3 tablet 0   azelastine (ASTELIN) 0.1 % nasal spray Place 2 sprays into both nostrils 2 (two) times daily. Use in each nostril as directed     benzoyl peroxide (CERAVE ACNE FOAMING CREAM) 4 % external liquid Apply 1 application topically 2 (two) times daily. Face     Blood Pressure Monitoring (BLOOD PRESSURE MONITOR AUTOMAT) DEVI Check Blood pressure at home daily 1 each 0   camphor-menthol (SARNA) lotion Apply 1 Application topically as needed for itching.     cetaphil (CETAPHIL) lotion Apply 1 application  topically 2 (two) times daily.     clindamycin (CLEOCIN T) 1 % external solution Apply 1 application topically 2 (two) times daily.     diltiazem (CARDIZEM) 30 MG tablet Take 1 tablet (30 mg total) by mouth 4 (four) times daily as needed (afib episodes). 45 tablet 1   fexofenadine (ALLEGRA) 180 MG tablet Take 180 mg by mouth in the morning.     folic acid (FOLVITE) 1 MG tablet Take 1 tablet (1 mg total) by mouth daily. 90 tablet 1   HYDROcodone-acetaminophen (NORCO/VICODIN) 5-325 MG tablet Take 1 tablet by mouth every 6 (six) hours as needed for moderate pain. 30 tablet 0   lidocaine-prilocaine (EMLA) cream Apply 1 application topically as needed. 30 g 2  Menthol, Topical Analgesic, (ICY HOT EX) Apply 1 application  topically daily as needed (feet pain).     mometasone-formoterol (DULERA) 100-5 MCG/ACT AERO Inhale 2 puffs into the lungs in the morning and at bedtime. 13 g 11   Multiple Minerals-Vitamins (CAL MAG ZINC +D3) TABS Take 3 capsules by mouth daily.     Multiple Vitamins-Minerals (ALIVE MULTI-VITAMIN PO) Take 1 tablet by mouth daily.     ondansetron (ZOFRAN) 8 MG tablet TAKE 1 TABLET BY MOUTH EVERY 8 HOURS AS NEEDED FOR NAUSEA OR  VOMITING 20 tablet 0   Polyethyl Glycol-Propyl Glycol (LUBRICANT EYE DROPS) 0.4-0.3 % SOLN Apply 1-2 drops to eye 3 (three) times daily as needed (dry/irritated eyes).     triamcinolone ointment (KENALOG) 0.1 % Apply 1 application  topically at bedtime.     VENTOLIN HFA 108 (90 Base) MCG/ACT inhaler INHALE 1 TO 2 PUFFS BY MOUTH EVERY 6 HOURS AS NEEDED FOR WHEEZING FOR SHORTNESS OF BREATH 18 g 3   No current facility-administered medications on file prior to visit.    Allergies:  Allergies  Allergen Reactions   Sudafed [Pseudoephedrine] Hypertension   Amlodipine Swelling   Atorvastatin Other (See Comments)    Cramping   Gabapentin Other (See Comments)    Raise blood pressure and red rings around eyes Blood vessels popped in her eyes   Mobic [Meloxicam] Swelling    Inflamed the area that has inflammation and stabbing pains in the area   Penicillins Hives     Childhood reaction   Prednisone     Caused A-fib   Past Medical History:  Past Medical History:  Diagnosis Date   Anemia    Angina 1982   related to stress   Asthma    in the past,  no current problems   Atrial fibrillation (HCC)    Complication of anesthesia    states anesthesia made her hair fall out, old meds. hard to awaken 1 time she took Flexeril before.   Constipation    COPD (chronic obstructive pulmonary disease) (HCC)    Dyspnea    on oxygen at home - 3L via [place]   Dysrhythmia    Fatigue    GERD (gastroesophageal reflux disease)    patient denies this dx   Heart murmur 1970s   no problems currently   Hypertension    Ingrown toenail    Lung cancer (HCC) 10/2019   metastatic disease to the brain   On home oxygen therapy    2L via  - 24 hours a day   Past heart attack 1980-1981   pt states she passed out and woke up in hospital- told she had heart attack, but then dr said he couldn't find anything wrong.   Peripheral vascular disease (HCC)    Pneumonia    x 1   Past Surgical History:  Past Surgical  History:  Procedure Laterality Date   CERVICAL DISC SURGERY  2000   Disc removed from neck    ingrown toe nail surgery Bilateral    IR IMAGING GUIDED PORT INSERTION  02/16/2020   MULTIPLE TOOTH EXTRACTIONS     for braces   RADIOLOGY WITH ANESTHESIA N/A 12/05/2019   Procedure: MRI BRAIN WITH AND WITHOUT CONTRAST;  Surgeon: Radiologist, Medication, MD;  Location: [facility] OR;  Service: Radiology;  Laterality: N/A;   RADIOLOGY WITH ANESTHESIA N/A 01/02/2020   Procedure: MRI BRAIN WITH AND WITHOUT CONTRAST;  Surgeon: Radiologist, Medication, MD;  Location: [facility] OR;  Service: Radiology;  Laterality: N/A;   RADIOLOGY WITH ANESTHESIA N/A 02/20/2020   Procedure: MRI WITH ANESTHESIA OF BRAIN WITH AND WITHOUT CONTRAST;  Surgeon: Radiologist, Medication, MD;  Location: [facility] OR;  Service: Radiology;  Laterality: N/A;   RADIOLOGY WITH ANESTHESIA N/A 06/11/2020   Procedure: MRI WITH ANESTHESIA OF BRAIN WITH AND WITHOUT CONTRAST;  Surgeon: Radiologist, Medication, MD;  Location: [facility] OR;  Service: Radiology;  Laterality: N/A;   RADIOLOGY WITH ANESTHESIA N/A 10/10/2020   Procedure: MRI WITH ANESTHESIA BRAIN WITH AND WITHOUT CONTRAST;  Surgeon: Radiologist, Medication, MD;  Location: [facility];  Service: Radiology;  Laterality: N/A;   RADIOLOGY WITH ANESTHESIA N/A 01/30/2021   Procedure: MRI BRAIN WITH AND WITHOUT CONTRASTWITH ANESTHESIA;  Surgeon: Radiologist, Medication, MD;  Location: [facility];  Service: Radiology;  Laterality: N/A;   RADIOLOGY WITH ANESTHESIA N/A 06/12/2021   Procedure: MRI WITH ANESTHESIA OF BRAIN WITH AND WITHOUT CONTRAST;  Surgeon: Radiologist, Medication, MD;  Location: [facility];  Service: Radiology;  Laterality: N/A;   RADIOLOGY WITH ANESTHESIA N/A 09/11/2021   Procedure: MRI OF BRAIN WITH AND WITHOUT CONTRAST WITH ANESTHESIA;  Surgeon: Radiologist, Medication, MD;  Location: [facility];  Service: Radiology;  Laterality: N/A;   RADIOLOGY WITH ANESTHESIA N/A 01/15/2022   Procedure: MRI BRAIN WITH AND WITHOUT CONTRAST  WITH ANESTHESIA;  Surgeon: Radiologist, Medication, MD;  Location: [facility];  Service: Radiology;  Laterality: N/A;   RADIOLOGY WITH ANESTHESIA N/A 05/14/2022   Procedure: MRI WITH ANESTHESIA BRAIN WITH AND WITHOUT CONTRAST;  Surgeon: Radiologist, Medication, MD;  Location: [facility];  Service: Radiology;  Laterality: N/A;   TONSILLECTOMY     Social History:  Social History   Socioeconomic History   Marital status: Legally Separated    Spouse name: Not on file   Number of children: 0   Years of education: Not on file   Highest education level: Not on file  Occupational History   Occupation: Investment banker, corporate  Tobacco Use   Smoking status: Former    Current packs/day: 0.00    Average packs/day: 1 pack/day for 21.0 years (21.0 ttl pk-yrs)    Types: Cigarettes    Start date: 43    Quit date: 2001    Years since quitting: 23.8   Smokeless tobacco: Never  Vaping Use   Vaping status: Never Used  Substance and Sexual Activity   Alcohol use: No   Drug use: No   Sexual activity: Not Currently    Birth control/protection: None, Post-menopausal  Other Topics Concern   Not on file  Social History Narrative   Not on file   Social Determinants of Health   Financial Resource Strain: High Risk (11/10/2019)   Overall Financial Resource Strain (CARDIA)    Difficulty of Paying Living Expenses: Hard  Food Insecurity: Low Risk  (09/14/2022)   [facility]   Hunger Vital Sign    Worried About Running Out of Food in the Last Year: Never true    Ran Out of Food in the Last Year: Never true  Transportation Needs: No Transportation Needs (09/16/2021)   PRAPARE - Administrator, Civil Service (Medical): No    Lack of Transportation (Non-Medical): No  Physical Activity: Inactive (11/10/2019)   Exercise Vital Sign    Days of Exercise per Week: 0 days    Minutes of Exercise per Session: 0 min  Stress: Not on file  Social Connections: Moderately Isolated (11/10/2019)    Social Connection and Isolation Panel [NHANES]  Frequency of Communication with Friends and Family: Twice a week    Frequency of Social Gatherings with Friends and Family: Twice a week    Attends Religious Services: More than 4 times per year    Active Member of [employer] or Organizations: No    Attends Engineer, structural: Not on file    Marital Status: Separated  Intimate Partner Violence: Not on file   Family History:  Family History  Problem Relation Age of Onset   Arthritis Mother    Heart failure Father     Review of Systems: Constitutional: Doesn't report fevers, chills or abnormal weight loss Eyes: Doesn't report blurriness of vision Ears, nose, mouth, throat, and face: Doesn't report sore throat Respiratory: Doesn't report cough, dyspnea or wheezes Cardiovascular: Doesn't report palpitation, chest discomfort  Gastrointestinal:  Doesn't report nausea, constipation, diarrhea GU: Doesn't report incontinence Skin: Doesn't report skin rashes Neurological: Per HPI Musculoskeletal: Doesn't report joint pain Behavioral/Psych: Doesn't report anxiety  Physical Exam: Vitals:   01/18/23 1149  BP: 123/66  Pulse: 87  Resp: 16  Temp: 98 F (36.7 C)  SpO2: 100%    KPS: 90. General: normal Head: Normal EENT: No conjunctival injection or scleral icterus.  Lungs: Resp effort normal Cardiac: Regular rate Abdomen: Non-distended abdomen Skin: No rashes cyanosis or petechiae. Extremities: No clubbing or edema  Neurologic Exam: Mental Status: Awake, alert, attentive to examiner. Oriented to self and environment. Language is fluent with intact comprehension.  Cranial Nerves: Visual acuity is grossly normal. Visual fields are full. Extra-ocular movements intact. No ptosis. Face is symmetric Motor: Tone and bulk are normal. Power is full in both arms and legs. Reflexes are symmetric, no pathologic reflexes present.  Sensory: Intact to light touch Gait:  Normal.   Labs: I have reviewed the data as listed    Component Value Date/Time   NA 140 05/14/2022 0845   NA 145 (H) 05/13/2021 1220   K 4.2 05/14/2022 0845   CL 105 05/14/2022 0845   CO2 24 05/14/2022 0845   GLUCOSE 83 05/14/2022 0845   BUN 11 05/14/2022 0845   BUN 12 05/13/2021 1220   CREATININE 1.15 (H) 05/14/2022 0845   CREATININE 1.28 (H) 01/08/2022 1025   CALCIUM 8.9 05/14/2022 0845   PROT 6.5 02/10/2022 1221   ALBUMIN 4.1 01/08/2022 1025   ALBUMIN 4.1 08/26/2020 1546   AST 16 01/08/2022 1025   ALT 6 01/08/2022 1025   ALKPHOS 42 01/08/2022 1025   BILITOT 0.4 01/08/2022 1025   GFRNONAA 54 (L) 05/14/2022 0845   GFRNONAA 47 (L) 01/08/2022 1025   GFRAA >60 01/08/2020 1342   GFRAA >60 01/04/2020 0907   Lab Results  Component Value Date   WBC 4.3 05/14/2022   NEUTROABS 3.3 01/08/2022   HGB 12.3 05/14/2022   HCT 37.1 05/14/2022   MCV 93.2 05/14/2022   PLT 115 (L) 05/14/2022    Imaging:  MR BRAIN W WO CONTRAST  Result Date: 01/08/2023 CLINICAL DATA:  62 year old female with metastatic lung cancer. Non-small cell lung cancer originally diagnosed in 2021. Status post two different rounds of SRS treatments to the brain in 2021 and 2022. Possible progression versus radiation necrosis in late 2023, and early this year. Subsequent imaging surveillance thus far, no repeat treatment. Restaging. EXAM: MRI HEAD WITHOUT AND WITH CONTRAST TECHNIQUE: Multiplanar, multiecho pulse sequences of the brain and surrounding structures were obtained without and with intravenous contrast. CONTRAST:  7 mL [employer] COMPARISON:  Brain MRI 07/23/2022 and earlier. FINDINGS: Brain:  Black blood postcontrast technique: Stable clustered faint enhancement right occipital lobe series 14, image 80. Slightly above that level also in the right occipital lobe clustered but less solid since 05/14/2022 enhancement series 14, image 84. At about the same level anterior right temporal lobe rim enhancing mass is 12-13  mm now, versus 12 mm in February and not significantly changed from April (series 14, image 77). Right parieto-occipital junction level dominant 5-6 mm enhancing nodule series 14, image 111 and nearby clustered enhancement just caudal to that level (image 105) remains stable. Contralateral clustered left parietal lobe 2-4 mm foci of enhancement series 14, image 122 are mildly increased over this series of exams since October last year. Minimal edema there now also on series 11, image 41, negative last year. Medial left posterior frontal and/or cingulate enhancing lesion on series 14 image 26 is stable. Larger and more stellate anterior left superior frontal gyrus lesion with rim enhancement measures up to 21 mm long axis now (18 mm by the same measuring technique in April) and also appears slowly enlarging since last year on series 14, image 144. Regional T2 and FLAIR hyperintensity there appears chronic and stable. Contralateral similar stellate posterior right frontal lobe lesion also about 21 mm and stable since April (series 14, image 135). Regional T2 and FLAIR hyperintensity there has mildly progressed since last year but not significantly changed from February. Tiny left anterior frontal lesion on series 14, image 125 has regressed since last year and is stable recently. No brand new enhancing lesion identified. And elsewhere stable patchy chronic T2 and FLAIR hyperintensity. No superimposed restricted diffusion suggestive of acute infarction. Stable post treatment hemosiderin associated with the above. No midline shift, ventriculomegaly, extra-axial collection or acute intracranial hemorrhage. Cervicomedullary junction and pituitary are within normal limits. Vascular: Major intracranial vascular flow voids are stable. Skull and upper cervical spine: Chronic cervical spine degeneration superimposed on C5-C6 ankylosis or arthrodesis. Background bone marrow signal remains within normal limits. Sinuses/Orbits:  Stable. Other: Mastoids are clear. Visible internal auditory structures appear normal. IMPRESSION: Similar mixed response, waxing and waning of chronically enhancing lesions as on recent restaging exams: - left anterior superior frontal gyrus stellate rim enhancing lesion has increased up to 21 mm now. Regional T2/FLAIR has mildly progressed compared to last year. - left parietal lobe small 2-4 mm foci of clustered enhancement are also mildly increasing. Small new T2/FLAIR hyperintensity there. - but regression of one area of abnormal right occipital enhancement recently. - and otherwise roughly seven additional areas of abnormal enhancement have not significantly changed since February. The overall picture since October of last year favors radiation necrosis over true progression. And no brand new metastatic disease is identified. Electronically Signed   By: Odessa Fleming M.D.   On: 01/08/2023 12:21     [facility] Clinician Interpretation: I have personally reviewed the radiological images as listed.  My interpretation, in the context of the patient's clinical presentation, is stable disease   Assessment/Plan Malignant neoplasm metastatic to brain [facility]) [C79.31]  Elizabeth Mayer is clinically stable today.  Brain MRI demonstrates mixed findings, with two lesions slightly larger today on post-contrast sequences.  She is agreeable with continued imaging surveillance only at this time; not interested in any further CNS directed treatments.  For headaches, con't PRN analgesia.   We appreciate the opportunity to participate in the care of Elizabeth Mayer.    Recommended she re-establish care with Dr. Leonides Schanz for lung cancer surveillance.  We ask that Elizabeth Mayer return to clinic in 6 months with brain MRI, or sooner as needed.    All questions were answered. The patient knows to call the clinic with any problems, questions or concerns. No barriers to learning were detected.  The total time spent in the  encounter was 40 minutes and more than 50% was on counseling and review of test results   Henreitta Leber, MD Medical Director of Neuro-Oncology [facility] 01/18/23 11:54 AM

## 2023-01-20 ENCOUNTER — Other Ambulatory Visit: Payer: Self-pay

## 2023-01-26 ENCOUNTER — Telehealth: Payer: Self-pay | Admitting: Physical Therapy

## 2023-01-26 NOTE — Telephone Encounter (Signed)
Attempted to return pt's call regarding signing forms for [employer] but unable to leave v/m due to inbox full. Elizabeth Mayer [place], [place] 01/26/23 3:52 PM

## 2023-01-28 ENCOUNTER — Telehealth: Payer: Self-pay | Admitting: *Deleted

## 2023-01-28 NOTE — Telephone Encounter (Signed)
Medical Clearance Form for [employer] program at [facility] signed by Dr Barbaraann Cao.  Faxed to Kendra Opitz, [phone].  Fax confirmation received.

## 2023-02-15 ENCOUNTER — Encounter: Payer: Self-pay | Admitting: Nurse Practitioner

## 2023-02-15 ENCOUNTER — Ambulatory Visit: Payer: Medicare Other | Admitting: Nurse Practitioner

## 2023-02-15 VITALS — BP 100/60 | HR 84 | Temp 98.7°F | Ht 65.0 in | Wt 131.2 lb

## 2023-02-15 DIAGNOSIS — R7303 Prediabetes: Secondary | ICD-10-CM | POA: Insufficient documentation

## 2023-02-15 DIAGNOSIS — Z23 Encounter for immunization: Secondary | ICD-10-CM

## 2023-02-15 DIAGNOSIS — E782 Mixed hyperlipidemia: Secondary | ICD-10-CM | POA: Diagnosis not present

## 2023-02-15 DIAGNOSIS — Z2821 Immunization not carried out because of patient refusal: Secondary | ICD-10-CM

## 2023-02-15 DIAGNOSIS — Z0000 Encounter for general adult medical examination without abnormal findings: Secondary | ICD-10-CM

## 2023-02-15 DIAGNOSIS — C3491 Malignant neoplasm of unspecified part of right bronchus or lung: Secondary | ICD-10-CM | POA: Diagnosis not present

## 2023-02-15 DIAGNOSIS — C7931 Secondary malignant neoplasm of brain: Secondary | ICD-10-CM

## 2023-02-15 DIAGNOSIS — Z1211 Encounter for screening for malignant neoplasm of colon: Secondary | ICD-10-CM

## 2023-02-15 LAB — POCT URINALYSIS DIP (CLINITEK)
Bilirubin, UA: NEGATIVE
Blood, UA: NEGATIVE
Glucose, UA: NEGATIVE mg/dL
Ketones, POC UA: NEGATIVE mg/dL
Leukocytes, UA: NEGATIVE
Nitrite, UA: NEGATIVE
POC PROTEIN,UA: NEGATIVE
Spec Grav, UA: 1.025 (ref 1.010–1.025)
Urobilinogen, UA: 0.2 U/dL
pH, UA: 6.5 (ref 5.0–8.0)

## 2023-02-15 NOTE — Patient Instructions (Addendum)
  Elizabeth Mayer , Thank you for taking time to come for your Medicare Wellness Visit. I appreciate your ongoing commitment to your health goals. Please review the following plan we discussed and let me know if I can assist you in the future.   These are the goals we discussed:  Goals   None     This is a list of the screening recommended for you and due dates:  Health Maintenance  Topic Date Due   Colon Cancer Screening  Never done   Flu Shot  11/05/2022   Mammogram  12/27/2022   COVID-19 Vaccine (1) 03/03/2023*   Zoster (Shingles) Vaccine (1 of 2) 05/18/2023*   Pap with HPV screening  02/15/2024*   Medicare Annual Wellness Visit  02/15/2024   DTaP/Tdap/Td vaccine (3 - Td or Tdap) 05/14/2031   Hepatitis C Screening  Completed   HIV Screening  Completed   HPV Vaccine  Aged Out  *Topic was postponed. The date shown is not the original due date.

## 2023-02-15 NOTE — Progress Notes (Addendum)
Subjective:    Elizabeth Mayer is a 62 y.o. female who presents for a Welcome to Medicare exam.   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 She is dealing with trying to get healed from this. She is seeing Dr Lyndel Pleasure - ENT.  She did go to the second opinion did not see a reason to not stop the chemo. Continue with the testing to monitor. She reports she had an MRI that was clear. She reports she has two areas in the middle of her brain. She said God told her "she was clear of cancer". She reports it was Dr. Barbaraann Cao. She reports the Oncologist at [facility] health. She is no longer seeing an Best boy. She did go to the provider in [place] -   She is no longer seeing Dr. Leonides Schanz because she reports he wants her to continue with chemo/radiation. She had gone for a second opinion in [place] and recommended she can stop the chemo.   She had a high dose of prednisone (Dr. Leonides Schanz) and had atrial fibrillation        Objective:    Today's Vitals   02/15/23 1132  BP: 100/60  Pulse: 84  Temp: 98.7 F (37.1 C)  TempSrc: Oral  Weight: 131 lb 3.2 oz (59.5 kg)  Height: 5\' 5"  (1.651 m)  PainSc: 0-No pain  Body mass index is 21.83 kg/m.  Medications Outpatient Encounter Medications as of 02/15/2023  Medication Sig   acetaminophen (TYLENOL) 500 MG tablet Take 1,000 mg by mouth every 6 (six) hours as needed for moderate pain.   ALPRAZolam (XANAX) 1 MG tablet Take 1 tablet (1 mg total) by mouth once as needed for up to 1 dose (mri claustrophobia). Take 45 minutes before MRI study   azelastine (ASTELIN) 0.1 % nasal spray Place 2 sprays into both nostrils 2 (two) times daily. Use in each nostril as directed   benzoyl peroxide (CERAVE ACNE FOAMING CREAM) 4 % external liquid Apply 1 application topically 2 (two) times daily. Face   Blood Pressure Monitoring (BLOOD PRESSURE MONITOR AUTOMAT) DEVI Check Blood pressure at home daily    camphor-menthol (SARNA) lotion Apply 1 Application topically as needed for itching.   cetaphil (CETAPHIL) lotion Apply 1 application  topically 2 (two) times daily.   clindamycin (CLEOCIN T) 1 % external solution Apply 1 application topically 2 (two) times daily.   diltiazem (CARDIZEM) 30 MG tablet Take 1 tablet (30 mg total) by mouth 4 (four) times daily as needed (afib episodes).   fexofenadine (ALLEGRA) 180 MG tablet Take 180 mg by mouth in the morning.   folic acid (FOLVITE) 1 MG tablet Take 1 tablet (1 mg total) by mouth daily.   HYDROcodone-acetaminophen (NORCO/VICODIN) 5-325 MG tablet Take 1 tablet by mouth every 6 (six) hours as needed for moderate pain.   lidocaine-prilocaine (EMLA) cream Apply 1 application topically as needed.   Menthol, Topical Analgesic, (ICY HOT EX) Apply 1 application  topically daily as needed (feet pain).   mometasone-formoterol (DULERA) 100-5 MCG/ACT AERO Inhale 2 puffs into the lungs in the morning and at bedtime.   Multiple Minerals-Vitamins (CAL MAG ZINC +D3) TABS Take 3 capsules by mouth daily.   Multiple Vitamins-Minerals (ALIVE MULTI-VITAMIN PO) Take 1 tablet by mouth daily.   ondansetron (ZOFRAN) 8 MG tablet TAKE 1 TABLET BY MOUTH EVERY 8 HOURS AS NEEDED FOR NAUSEA OR VOMITING   Polyethyl Glycol-Propyl Glycol (  LUBRICANT EYE DROPS) 0.4-0.3 % SOLN Apply 1-2 drops to eye 3 (three) times daily as needed (dry/irritated eyes).   triamcinolone ointment (KENALOG) 0.1 % Apply 1 application  topically at bedtime.   VENTOLIN HFA 108 (90 Base) MCG/ACT inhaler INHALE 1 TO 2 PUFFS BY MOUTH EVERY 6 HOURS AS NEEDED FOR WHEEZING FOR SHORTNESS OF BREATH   No facility-administered encounter medications on file as of 02/15/2023.     History: Past Medical History:  Diagnosis Date   Anemia    Angina 1982   related to stress   Asthma    in the past,  no current problems   Atrial fibrillation (HCC)    Complication of anesthesia    states anesthesia made her hair fall  out, old meds. hard to awaken 1 time she took Flexeril before.   Constipation    COPD (chronic obstructive pulmonary disease) (HCC)    Dyspnea    on oxygen at home - 3L via [place]   Dysrhythmia    Fatigue    GERD (gastroesophageal reflux disease)    patient denies this dx   Heart murmur 1970s   no problems currently   Hypertension    Ingrown toenail    Lung cancer (HCC) 10/2019   metastatic disease to the brain   On home oxygen therapy    2L via [place] - 24 hours a day   Past heart attack 1980-1981   pt states she passed out and woke up in hospital- told she had heart attack, but then dr said he couldn't find anything wrong.   Peripheral vascular disease (HCC)    Pneumonia    x 1   Past Surgical History:  Procedure Laterality Date   CERVICAL DISC SURGERY  2000   Disc removed from neck    ingrown toe nail surgery Bilateral    IR IMAGING GUIDED PORT INSERTION  02/16/2020   MULTIPLE TOOTH EXTRACTIONS     for braces   RADIOLOGY WITH ANESTHESIA N/A 12/05/2019   Procedure: MRI BRAIN WITH AND WITHOUT CONTRAST;  Surgeon: Radiologist, Medication, MD;  Location: [facility] OR;  Service: Radiology;  Laterality: N/A;   RADIOLOGY WITH ANESTHESIA N/A 01/02/2020   Procedure: MRI BRAIN WITH AND WITHOUT CONTRAST;  Surgeon: Radiologist, Medication, MD;  Location: [facility] OR;  Service: Radiology;  Laterality: N/A;   RADIOLOGY WITH ANESTHESIA N/A 02/20/2020   Procedure: MRI WITH ANESTHESIA OF BRAIN WITH AND WITHOUT CONTRAST;  Surgeon: Radiologist, Medication, MD;  Location: [facility];  Service: Radiology;  Laterality: N/A;   RADIOLOGY WITH ANESTHESIA N/A 06/11/2020   Procedure: MRI WITH ANESTHESIA OF BRAIN WITH AND WITHOUT CONTRAST;  Surgeon: Radiologist, Medication, MD;  Location: [facility];  Service: Radiology;  Laterality: N/A;   RADIOLOGY WITH ANESTHESIA N/A 10/10/2020   Procedure: MRI WITH ANESTHESIA BRAIN WITH AND WITHOUT CONTRAST;  Surgeon: Radiologist, Medication, MD;  Location: [facility];  Service: Radiology;  Laterality:  N/A;   RADIOLOGY WITH ANESTHESIA N/A 01/30/2021   Procedure: MRI BRAIN WITH AND WITHOUT CONTRASTWITH ANESTHESIA;  Surgeon: Radiologist, Medication, MD;  Location: [facility];  Service: Radiology;  Laterality: N/A;   RADIOLOGY WITH ANESTHESIA N/A 06/12/2021   Procedure: MRI WITH ANESTHESIA OF BRAIN WITH AND WITHOUT CONTRAST;  Surgeon: Radiologist, Medication, MD;  Location: [facility];  Service: Radiology;  Laterality: N/A;   RADIOLOGY WITH ANESTHESIA N/A 09/11/2021   Procedure: MRI OF BRAIN WITH AND WITHOUT CONTRAST WITH ANESTHESIA;  Surgeon: Radiologist, Medication, MD;  Location: [facility];  Service: Radiology;  Laterality: N/A;  RADIOLOGY WITH ANESTHESIA N/A 01/15/2022   Procedure: MRI BRAIN WITH AND WITHOUT CONTRAST WITH ANESTHESIA;  Surgeon: Radiologist, Medication, MD;  Location: [facility];  Service: Radiology;  Laterality: N/A;   RADIOLOGY WITH ANESTHESIA N/A 05/14/2022   Procedure: MRI WITH ANESTHESIA BRAIN WITH AND WITHOUT CONTRAST;  Surgeon: Radiologist, Medication, MD;  Location: [facility];  Service: Radiology;  Laterality: N/A;   TONSILLECTOMY      Family History  Problem Relation Age of Onset   Arthritis Mother    Heart failure Father    Social History   Occupational History   Occupation: Investment banker, corporate  Tobacco Use   Smoking status: Former    Current packs/day: 0.00    Average packs/day: 1 pack/day for 21.0 years (21.0 ttl pk-yrs)    Types: Cigarettes    Start date: 65    Quit date: 2001    Years since quitting: 23.9   Smokeless tobacco: Never  Vaping Use   Vaping status: Never Used  Substance and Sexual Activity   Alcohol use: No   Drug use: No   Sexual activity: Not Currently    Birth control/protection: None, Post-menopausal    Tobacco Counseling Counseling given: Yes  Immunizations and Health Maintenance Immunization History  Administered Date(s) Administered   Influenza, Seasonal, Injecte, Preservative Fre 02/15/2023   Influenza,inj,Quad PF,6+ Mos 01/05/2020, 02/07/2021,  02/10/2022   Td 10/06/1994   Tdap 05/13/2021   Health Maintenance Due  Topic Date Due   COVID-19 Vaccine (1) Never done   Colonoscopy  Never done   MAMMOGRAM  12/27/2022    Activities of Daily Living    05/14/2022    8:12 AM 05/14/2022    8:10 AM  In your present state of health, do you have any difficulty performing the following activities:  Hearing?  0  Vision?  0  Difficulty concentrating or making decisions?  1  Walking or climbing stairs?  0  Dressing or bathing?  0  Doing errands, shopping? 0   Review of Systems  Constitutional: Negative.   HENT: Negative.         She is being followed by Dr. Lyndel Pleasure   Eyes: Negative.   Respiratory: Negative.    Cardiovascular: Negative.   Gastrointestinal: Negative.   Genitourinary: Negative.   Musculoskeletal: Negative.   Skin: Negative.   Neurological: Negative.   Endo/Heme/Allergies: Negative.   Psychiatric/Behavioral: Negative.      Physical Exam Vitals reviewed.  Constitutional:      General: She is not in acute distress.    Appearance: Normal appearance. She is well-developed.  HENT:     Head: Normocephalic and atraumatic.     Right Ear: Hearing, tympanic membrane, ear canal and external ear normal. There is no impacted cerumen.     Left Ear: Hearing, tympanic membrane, ear canal and external ear normal. There is no impacted cerumen.     Nose: Nose normal.     Mouth/Throat:     Mouth: Mucous membranes are moist.  Eyes:     General: Lids are normal.     Extraocular Movements: Extraocular movements intact.     Conjunctiva/sclera: Conjunctivae normal.     Pupils: Pupils are equal, round, and reactive to light.     Funduscopic exam:    Right eye: No papilledema.        Left eye: No papilledema.  Neck:     Thyroid: No thyroid mass.     Vascular: No carotid bruit.  Cardiovascular:     Rate and  Rhythm: Normal rate and regular rhythm.     Pulses: Normal pulses.     Heart sounds: Normal heart sounds. No murmur  heard. Pulmonary:     Effort: Pulmonary effort is normal. No respiratory distress.     Breath sounds: Normal breath sounds. No wheezing.  Chest:     Chest wall: No mass.  Breasts:    Tanner Score is 5.     Right: Normal. No mass or tenderness.     Left: Normal. No mass or tenderness.  Abdominal:     General: Abdomen is flat. Bowel sounds are normal. There is no distension.     Palpations: Abdomen is soft.     Tenderness: There is no abdominal tenderness.  Genitourinary:    Rectum: Guaiac result negative.  Musculoskeletal:        General: No swelling. Normal range of motion.     Cervical back: Full passive range of motion without pain, normal range of motion and neck supple.     Right lower leg: No edema.     Left lower leg: No edema.  Lymphadenopathy:     Upper Body:     Right upper body: No supraclavicular, axillary or pectoral adenopathy.     Left upper body: No supraclavicular, axillary or pectoral adenopathy.  Skin:    General: Skin is warm and dry.     Capillary Refill: Capillary refill takes less than 2 seconds.     Findings: Rash present.  Neurological:     General: No focal deficit present.     Mental Status: She is alert and oriented to person, place, and time.     Cranial Nerves: No cranial nerve deficit.     Sensory: No sensory deficit.  Psychiatric:        Mood and Affect: Mood normal.        Behavior: Behavior normal.        Thought Content: Thought content normal.        Judgment: Judgment normal.    (optional), or other factors deemed appropriate based on the beneficiary's medical and social history and current clinical standards.   Advanced Directives: Does Patient Have a Medical Advance Directive?: Yes Does patient want to make changes to medical advance directive?: Yes (MAU/Ambulatory/Procedural Areas - Information given)  EKG:  normal EKG, normal sinus rhythm, unchanged from previous tracings, nonspecific T waves changes HR 84      Assessment:     This is a routine wellness examination for this patient .   Vision/Hearing screen Hearing Screening   500Hz  1000Hz  2000Hz  4000Hz   Right ear 20 25 40 40  Left ear 25 25 20  40   Vision Screening   Right eye Left eye Both eyes  Without correction 20/30 20/30 20/30   With correction 20/20 20/20 20/20      Goals      Increase My Muscle Strength     muscl        Depression Screen    02/15/2023   11:20 AM 02/15/2023   11:18 AM 02/10/2022   11:15 AM 09/16/2021    9:32 AM  PHQ 2/9 Scores  PHQ - 2 Score 0 0 0 0  PHQ- 9 Score 4   4     Fall Risk    02/15/2023   11:20 AM  Fall Risk   Falls in the past year? 0  Number falls in past yr: 0  Injury with Fall? 0  Risk for fall due to : No Fall  Risks  Follow up Falls evaluation completed    Cognitive Function:        02/15/2023   11:22 AM  6CIT Screen  What Year? 0 points  What month? 0 points  What time? 0 points  Count back from 20 0 points  Months in reverse 0 points  Repeat phrase 2 points  Total Score 2 points    Patient Care Team: Arnette Felts, FNP as PCP - General (General Practice)     Plan:  Welcome to Medicare preventive visit Assessment & Plan: Pt's annual wellness exam was performed and geriatric assessment reviewed.  Pt has no new identiafble wellness concerns at this time.  WIll obtain routine labs.  Will obtain UA and micro.  Behavior modifications discussed and diet history reviewed. Pt will continue to exercise regularly and modify diet, with low GI, plant based foods and decrease food intake of processed foods.  Recommend intake of daily multivitamin, Vitamin D, and calcium. Recommond mammogram and colonoscopy for preventive screenings, as well as recommend immunizations that include influenza (up to date) and TDAP  Orders: -     EKG 12-Lead -     POCT URINALYSIS DIP (CLINITEK) -     Microalbumin / creatinine urine ratio  Encounter for Medicare annual wellness exam Assessment &  Plan:    Orders: -     EKG 12-Lead -     POCT URINALYSIS DIP (CLINITEK) -     Microalbumin / creatinine urine ratio  Herpes zoster vaccination declined Assessment & Plan: Declines shingrix, educated on disease process and is aware if he changes his mind to notify office    COVID-19 vaccination declined Assessment & Plan: Declines covid 19 vaccine. Discussed risk of covid 27 and if she changes her mind about the vaccine to call the office. Education has been provided regarding the importance of this vaccine but patient still declined. Advised may receive this vaccine at local pharmacy or Health Dept.or vaccine clinic. Aware to provide a copy of the vaccination record if obtained from local pharmacy or Health Dept.  Encouraged to take multivitamin, vitamin d, vitamin c and zinc to increase immune system. Aware can call office if would like to have vaccine here at office. Verbalized acceptance and understanding.    Need for influenza vaccination Assessment & Plan: Influenza vaccine administered Encouraged to take Tylenol as needed for fever or muscle aches.   Orders: -     Flu vaccine trivalent PF, 6mos and older(Flulaval,Afluria,Fluarix,Fluzone)  Prediabetes Assessment & Plan: Hgba1c is stable. Will check today. Continue focusing on healthy diet and exercise.   Orders: -     CBC with Differential/Platelet -     Hemoglobin A1c  Mixed hyperlipidemia Assessment & Plan: Cholesterol levels are stable. Continue focusing on low fat diet.   Orders: -     CMP14+EGFR -     Lipid panel  Non-small cell lung cancer, right (HCC)  Malignant neoplasm metastatic to brain [facility]) Assessment & Plan: She is advised to get established with either the provider at [facility] for management of her cancer and monitoring. She has seen Dr. Barbaraann Cao but not sure if she is going back   Encounter for screening colonoscopy Assessment & Plan: According to USPTF Colorectal cancer Screening  guidelines. Colonoscopy is recommended every 10 years, starting at age 16 years. Will refer to GI for colon cancer screening.   Orders: -     Ambulatory referral to Gastroenterology     I have personally reviewed  and noted the following in the patient's chart:   Medical and social history Use of alcohol, tobacco or illicit drugs  Current medications and supplements Functional ability and status Nutritional status Physical activity Advanced directives List of other physicians Hospitalizations, surgeries, and ER visits in previous 12 months Vitals Screenings to include cognitive, depression, and falls Referrals and appointments  In addition, I have reviewed and discussed with patient certain preventive protocols, quality metrics, and best practice recommendations. A written personalized care plan for preventive services as well as general preventive health recommendations were provided to patient.     Arnette Felts, FNP 03/16/2023

## 2023-02-16 ENCOUNTER — Encounter: Payer: Self-pay | Admitting: Hematology and Oncology

## 2023-02-16 ENCOUNTER — Other Ambulatory Visit: Payer: Self-pay

## 2023-02-16 LAB — CMP14+EGFR
ALT: 7 [IU]/L (ref 0–32)
AST: 17 [IU]/L (ref 0–40)
Albumin: 4.3 g/dL (ref 3.9–4.9)
Alkaline Phosphatase: 40 [IU]/L — ABNORMAL LOW (ref 44–121)
BUN/Creatinine Ratio: 10 — ABNORMAL LOW (ref 12–28)
BUN: 11 mg/dL (ref 8–27)
Bilirubin Total: 0.3 mg/dL (ref 0.0–1.2)
CO2: 26 mmol/L (ref 20–29)
Calcium: 9.4 mg/dL (ref 8.7–10.3)
Chloride: 102 mmol/L (ref 96–106)
Creatinine, Ser: 1.05 mg/dL — ABNORMAL HIGH (ref 0.57–1.00)
Globulin, Total: 2.4 g/dL (ref 1.5–4.5)
Glucose: 79 mg/dL (ref 70–99)
Potassium: 3.8 mmol/L (ref 3.5–5.2)
Sodium: 144 mmol/L (ref 134–144)
Total Protein: 6.7 g/dL (ref 6.0–8.5)
eGFR: 59 mL/min/{1.73_m2} — ABNORMAL LOW (ref >59)

## 2023-02-16 LAB — CBC WITH DIFFERENTIAL/PLATELET
Basophils Absolute: 0.1 10*3/uL (ref 0.0–0.2)
Basos: 1 %
EOS (ABSOLUTE): 0.2 10*3/uL (ref 0.0–0.4)
Eos: 3 %
Hematocrit: 40 % (ref 34.0–46.6)
Hemoglobin: 12.8 g/dL (ref 11.1–15.9)
Immature Grans (Abs): 0 10*3/uL (ref 0.0–0.1)
Immature Granulocytes: 0 %
Lymphocytes Absolute: 1.5 10*3/uL (ref 0.7–3.1)
Lymphs: 29 %
MCH: 29.8 pg (ref 26.6–33.0)
MCHC: 32 g/dL (ref 31.5–35.7)
MCV: 93 fL (ref 79–97)
Monocytes Absolute: 0.4 10*3/uL (ref 0.1–0.9)
Monocytes: 7 %
Neutrophils Absolute: 3 10*3/uL (ref 1.4–7.0)
Neutrophils: 60 %
Platelets: 139 10*3/uL — ABNORMAL LOW (ref 150–450)
RBC: 4.3 x10E6/uL (ref 3.77–5.28)
RDW: 12.9 % (ref 11.7–15.4)
WBC: 5 10*3/uL (ref 3.4–10.8)

## 2023-02-16 LAB — LIPID PANEL
Chol/HDL Ratio: 2.4 ratio (ref 0.0–4.4)
Cholesterol, Total: 179 mg/dL (ref 100–199)
HDL: 75 mg/dL (ref >39)
LDL Chol Calc (NIH): 92 mg/dL (ref 0–99)
Triglycerides: 62 mg/dL (ref 0–149)
VLDL Cholesterol Cal: 12 mg/dL (ref 5–40)

## 2023-02-16 LAB — HEMOGLOBIN A1C
Est. average glucose Bld gHb Est-mCnc: 120 mg/dL
Hgb A1c MFr Bld: 5.8 % — ABNORMAL HIGH (ref 4.8–5.6)

## 2023-02-16 LAB — MICROALBUMIN / CREATININE URINE RATIO
Creatinine, Urine: 175.4 mg/dL
Microalb/Creat Ratio: 3 mg/g{creat} (ref 0–29)
Microalbumin, Urine: 4.7 ug/mL

## 2023-03-03 ENCOUNTER — Encounter: Payer: Self-pay | Admitting: Nurse Practitioner

## 2023-03-03 DIAGNOSIS — Z1211 Encounter for screening for malignant neoplasm of colon: Secondary | ICD-10-CM | POA: Insufficient documentation

## 2023-03-03 DIAGNOSIS — Z0000 Encounter for general adult medical examination without abnormal findings: Secondary | ICD-10-CM | POA: Insufficient documentation

## 2023-03-03 NOTE — Assessment & Plan Note (Signed)
Hgba1c is stable. Will check today. Continue focusing on healthy diet and exercise.

## 2023-03-03 NOTE — Assessment & Plan Note (Signed)
Pt's annual wellness exam was performed and geriatric assessment reviewed.  Pt has no new identiafble wellness concerns at this time.  WIll obtain routine labs.  Will obtain UA and micro.  Behavior modifications discussed and diet history reviewed. Pt will continue to exercise regularly and modify diet, with low GI, plant based foods and decrease food intake of processed foods.  Recommend intake of daily multivitamin, Vitamin D, and calcium. Recommond mammogram and colonoscopy for preventive screenings, as well as recommend immunizations that include influenza (up to date) and TDAP

## 2023-03-03 NOTE — Assessment & Plan Note (Signed)

## 2023-03-03 NOTE — Assessment & Plan Note (Signed)
Declines shingrix, educated on disease process and is aware if he changes his mind to notify office  

## 2023-03-03 NOTE — Assessment & Plan Note (Signed)
She is advised to get established with either the provider at [facility] for management of her cancer and monitoring. She has seen Dr. Barbaraann Cao but not sure if she is going back

## 2023-03-03 NOTE — Assessment & Plan Note (Signed)
Influenza vaccine administered Encouraged to take Tylenol as needed for fever or muscle aches.

## 2023-03-03 NOTE — Assessment & Plan Note (Signed)
Cholesterol levels are stable. Continue focusing on low fat diet.

## 2023-03-03 NOTE — Assessment & Plan Note (Signed)
According to USPTF Colorectal cancer Screening guidelines. Colonoscopy is recommended every 10 years, starting at age 64 years. Will refer to GI for colon cancer screening.

## 2023-05-20 ENCOUNTER — Telehealth: Payer: Self-pay

## 2023-05-20 NOTE — Telephone Encounter (Signed)
Patient called about fall in January- patient reports she hurt her shoulder and would like xrays of her shoulder and neck. Patient was advised to go to [facility], patient was also scheduled for 2/25 with Janece.

## 2023-05-25 ENCOUNTER — Encounter: Payer: Self-pay | Admitting: Physician Assistant

## 2023-05-25 ENCOUNTER — Encounter: Payer: Self-pay | Admitting: Hematology and Oncology

## 2023-06-01 ENCOUNTER — Ambulatory Visit (INDEPENDENT_AMBULATORY_CARE_PROVIDER_SITE_OTHER): Payer: Medicare Other | Admitting: Nurse Practitioner

## 2023-06-01 ENCOUNTER — Encounter: Payer: Self-pay | Admitting: Nurse Practitioner

## 2023-06-01 ENCOUNTER — Encounter: Payer: Self-pay | Admitting: Physician Assistant

## 2023-06-01 ENCOUNTER — Encounter: Payer: Self-pay | Admitting: Hematology and Oncology

## 2023-06-01 ENCOUNTER — Ambulatory Visit
Admission: RE | Admit: 2023-06-01 | Discharge: 2023-06-01 | Disposition: A | Discharge status: Home / Self Care | Payer: Medicare Other | Source: Ambulatory Visit | Attending: Nurse Practitioner | Admitting: Nurse Practitioner

## 2023-06-01 VITALS — BP 120/74 | HR 88 | Temp 97.6°F | Ht 65.0 in | Wt 115.6 lb

## 2023-06-01 DIAGNOSIS — C7931 Secondary malignant neoplasm of brain: Secondary | ICD-10-CM

## 2023-06-01 DIAGNOSIS — M25511 Pain in right shoulder: Secondary | ICD-10-CM

## 2023-06-01 DIAGNOSIS — W19XXXA Unspecified fall, initial encounter: Secondary | ICD-10-CM | POA: Insufficient documentation

## 2023-06-01 DIAGNOSIS — W19XXXD Unspecified fall, subsequent encounter: Secondary | ICD-10-CM

## 2023-06-01 DIAGNOSIS — Z2821 Immunization not carried out because of patient refusal: Secondary | ICD-10-CM

## 2023-06-01 DIAGNOSIS — C3491 Malignant neoplasm of unspecified part of right bronchus or lung: Secondary | ICD-10-CM | POA: Diagnosis not present

## 2023-06-01 DIAGNOSIS — J3801 Paralysis of vocal cords and larynx, unilateral: Secondary | ICD-10-CM

## 2023-06-01 DIAGNOSIS — J3802 Paralysis of vocal cords and larynx, bilateral: Secondary | ICD-10-CM | POA: Insufficient documentation

## 2023-06-01 HISTORY — DX: Unspecified fall, initial encounter: W19.XXXA

## 2023-06-01 NOTE — Patient Instructions (Signed)
 You can go to [address] for your xray.

## 2023-06-01 NOTE — Progress Notes (Unsigned)
 Madelaine Bhat, CMA,acting as a Neurosurgeon for Arnette Felts, FNP.,have documented all relevant documentation on the behalf of Arnette Felts, FNP,as directed by  Arnette Felts, FNP while in the presence of Arnette Felts, FNP.  Subjective:  Patient ID: Elizabeth Mayer , female    DOB: September 28, 1960 , 63 y.o.   MRN: [id number]  No chief complaint on file.   HPI  Patient presents today for right shoulder pain and a  nerve injury in her neck which caused her shoulder pain.   Patient reports she had a fall in the beginning of January while adjusting the trash cans and her foot slipped and she fell back. Afterwards her shoulder began to hurt. Her pain on her right shoulder was feeling the same as the left shoulder. She is requesting an xray to see what is going on. [facility] not been able to do any on the floor. She has been using a pain cream with magnesium.   She is trying to contact the [facility] to repeat her MRI.   Shoulder Pain  The pain is present in the right shoulder. This is a new problem. The current episode started more than 1 month ago. There has been no history of extremity trauma. The problem occurs constantly. The quality of the pain is described as aching. The pain is at a severity of 5/10. Pertinent negatives include no fever. She has tried nothing for the symptoms.     Past Medical History:  Diagnosis Date   Anemia    Angina 1982   related to stress   Asthma    in the past,  no current problems   Atrial fibrillation (HCC)    Complication of anesthesia    states anesthesia made her hair fall out, old meds. hard to awaken 1 time she took Flexeril before.   Constipation    COPD (chronic obstructive pulmonary disease) (HCC)    Dyspnea    on oxygen at home - 3L via [place]   Dysrhythmia    Fatigue    GERD (gastroesophageal reflux disease)    patient denies this dx   Heart murmur 1970s   no problems currently   Hypertension    Ingrown toenail     Lung cancer (HCC) 10/2019   metastatic disease to the brain   On home oxygen therapy    2L via  - 24 hours a day   Past heart attack 1980-1981   pt states she passed out and woke up in hospital- told she had heart attack, but then dr said he couldn't find anything wrong.   Peripheral vascular disease (HCC)    Pneumonia    x 1     Family History  Problem Relation Age of Onset   Arthritis Mother    Heart failure Father      Current Outpatient Medications:    acetaminophen (TYLENOL) 500 MG tablet, Take 1,000 mg by mouth every 6 (six) hours as needed for moderate pain., Disp: , Rfl:    ALPRAZolam (XANAX) 1 MG tablet, Take 1 tablet (1 mg total) by mouth once as needed for up to 1 dose (mri claustrophobia). Take 45 minutes before MRI study, Disp: 3 tablet, Rfl: 0   azelastine (ASTELIN) 0.1 % nasal spray, Place 2 sprays into both nostrils 2 (two) times daily. Use in each nostril as directed, Disp: , Rfl:    benzoyl peroxide (CERAVE ACNE FOAMING CREAM) 4 % external liquid,  Apply 1 application topically 2 (two) times daily. Face, Disp: , Rfl:    Blood Pressure Monitoring (BLOOD PRESSURE MONITOR AUTOMAT) DEVI, Check Blood pressure at home daily, Disp: 1 each, Rfl: 0   camphor-menthol (SARNA) lotion, Apply 1 Application topically as needed for itching., Disp: , Rfl:    cetaphil (CETAPHIL) lotion, Apply 1 application  topically 2 (two) times daily., Disp: , Rfl:    clindamycin (CLEOCIN T) 1 % external solution, Apply 1 application topically 2 (two) times daily., Disp: , Rfl:    diltiazem (CARDIZEM) 30 MG tablet, Take 1 tablet (30 mg total) by mouth 4 (four) times daily as needed (afib episodes)., Disp: 45 tablet, Rfl: 1   fexofenadine (ALLEGRA) 180 MG tablet, Take 180 mg by mouth in the morning., Disp: , Rfl:    folic acid (FOLVITE) 1 MG tablet, Take 1 tablet (1 mg total) by mouth daily., Disp: 90 tablet, Rfl: 1   HYDROcodone-acetaminophen (NORCO/VICODIN) 5-325 MG tablet, Take 1 tablet by  mouth every 6 (six) hours as needed for moderate pain., Disp: 30 tablet, Rfl: 0   lidocaine-prilocaine (EMLA) cream, Apply 1 application topically as needed., Disp: 30 g, Rfl: 2   Menthol, Topical Analgesic, (ICY HOT EX), Apply 1 application  topically daily as needed (feet pain)., Disp: , Rfl:    mometasone-formoterol (DULERA) 100-5 MCG/ACT AERO, Inhale 2 puffs into the lungs in the morning and at bedtime., Disp: 13 g, Rfl: 11   Multiple Minerals-Vitamins (CAL MAG ZINC +D3) TABS, Take 3 capsules by mouth daily., Disp: , Rfl:    Multiple Vitamins-Minerals (ALIVE MULTI-VITAMIN PO), Take 1 tablet by mouth daily., Disp: , Rfl:    ondansetron (ZOFRAN) 8 MG tablet, TAKE 1 TABLET BY MOUTH EVERY 8 HOURS AS NEEDED FOR NAUSEA OR VOMITING, Disp: 20 tablet, Rfl: 0   Polyethyl Glycol-Propyl Glycol (LUBRICANT EYE DROPS) 0.4-0.3 % SOLN, Apply 1-2 drops to eye 3 (three) times daily as needed (dry/irritated eyes)., Disp: , Rfl:    triamcinolone ointment (KENALOG) 0.1 %, Apply 1 application  topically at bedtime., Disp: , Rfl:    VENTOLIN HFA 108 (90 Base) MCG/ACT inhaler, INHALE 1 TO 2 PUFFS BY MOUTH EVERY 6 HOURS AS NEEDED FOR WHEEZING FOR SHORTNESS OF BREATH, Disp: 18 g, Rfl: 3   Allergies  Allergen Reactions   Sudafed [Pseudoephedrine] Hypertension   Amlodipine Swelling   Atorvastatin Other (See Comments)    Cramping   Gabapentin Other (See Comments)    Raise blood pressure and red rings around eyes Blood vessels popped in her eyes   Mobic [Meloxicam] Swelling    Inflamed the area that has inflammation and stabbing pains in the area   Penicillins Hives     Childhood reaction   Prednisone     Caused A-fib     Review of Systems  Constitutional: Negative.  Negative for fever.  Respiratory: Negative.    Cardiovascular: Negative.   Musculoskeletal:        Right shoulder pain.   Neurological: Negative.   Psychiatric/Behavioral: Negative.       Today's Vitals   06/01/23 1414  BP: 120/74  Pulse:  88  Temp: 97.6 F (36.4 C)  TempSrc: Oral  Weight: 115 lb 9.6 oz (52.4 kg)  Height: 5\' 5"  (1.651 m)  PainSc: 5   PainLoc: Shoulder   Body mass index is 19.24 kg/m.  Wt Readings from Last 3 Encounters:  06/01/23 115 lb 9.6 oz (52.4 kg)  02/15/23 131 lb 3.2 oz (59.5 kg)  01/18/23  134 lb 4.8 oz (60.9 kg)    Objective:  Physical Exam Vitals reviewed.  Constitutional:      General: She is not in acute distress.    Appearance: Normal appearance.  Cardiovascular:     Rate and Rhythm: Normal rate and regular rhythm.     Pulses: Normal pulses.     Heart sounds: Normal heart sounds. No murmur heard. Pulmonary:     Effort: Pulmonary effort is normal. No respiratory distress.     Breath sounds: Normal breath sounds. No wheezing.  Musculoskeletal:        General: Tenderness (right shoulder) and deformity (right shoulder is uneven) present. No swelling.     Right shoulder: Decreased range of motion.     Left shoulder: Normal range of motion.  Neurological:     General: No focal deficit present.     Mental Status: She is alert and oriented to person, place, and time.     Cranial Nerves: No cranial nerve deficit.     Motor: No weakness.  Psychiatric:        Mood and Affect: Mood normal.        Behavior: Behavior normal.        Thought Content: Thought content normal.        Judgment: Judgment normal.         Assessment And Plan:  Acute pain of right shoulder Assessment & Plan: Limited range of motion and appears uneven compared to left shoulder, will check xray.   Orders: -     DG Shoulder Right; Future  Herpes zoster vaccination declined Assessment & Plan: Declines shingrix, educated on disease process and is aware if he changes his mind to notify office    COVID-19 vaccination declined Assessment & Plan: Declines covid 19 vaccine. Discussed risk of covid 41 and if she changes her mind about the vaccine to call the office. Education has been provided regarding the  importance of this vaccine but patient still declined. Advised may receive this vaccine at local pharmacy or Health Dept.or vaccine clinic. Aware to provide a copy of the vaccination record if obtained from local pharmacy or Health Dept.  Encouraged to take multivitamin, vitamin d, vitamin c and zinc to increase immune system. Aware can call office if would like to have vaccine here at office. Verbalized acceptance and understanding.    Fall, subsequent encounter Assessment & Plan: Fall in January did not seek urgent care attention. Discussed in future with falls to at least call office for possible instructions  Orders: -     DG Shoulder Right; Future  Non-small cell lung cancer, right [facility]) Assessment & Plan: She plans to f/u with [facility].    Malignant neoplasm metastatic to brain [facility]) Assessment & Plan: F/u with [facility]   Paralysis of left vocal cord Assessment & Plan: Continue f/u with ENT     Return in about 3 months (around 08/29/2023) for pre dm check.  Patient was given opportunity to ask questions. Patient verbalized understanding of the plan and was able to repeat key elements of the plan. All questions were answered to their satisfaction.    Jeanell Sparrow, FNP, have reviewed all documentation for this visit. The documentation on 06/01/23 for the exam, diagnosis, procedures, and orders are all accurate and complete.   IF YOU HAVE BEEN REFERRED TO A SPECIALIST, IT MAY TAKE 1-2 WEEKS TO SCHEDULE/PROCESS THE REFERRAL. IF YOU HAVE NOT HEARD FROM US/SPECIALIST IN TWO WEEKS, PLEASE GIVE [place]  A CALL AT [phone].

## 2023-06-04 ENCOUNTER — Encounter: Payer: Self-pay | Admitting: Nurse Practitioner

## 2023-06-04 NOTE — Assessment & Plan Note (Signed)
 Limited range of motion and appears uneven compared to left shoulder, will check xray.

## 2023-06-04 NOTE — Assessment & Plan Note (Signed)

## 2023-06-04 NOTE — Assessment & Plan Note (Signed)
 Fall in January did not seek urgent care attention. Discussed in future with falls to at least call office for possible instructions

## 2023-06-04 NOTE — Assessment & Plan Note (Addendum)
 She plans to f/u with [facility].

## 2023-06-04 NOTE — Assessment & Plan Note (Signed)
 Declines shingrix, educated on disease process and is aware if he changes his mind to notify office

## 2023-06-04 NOTE — Assessment & Plan Note (Signed)
Continue f/u with ENT.

## 2023-06-04 NOTE — Assessment & Plan Note (Signed)
 F/u with [facility]

## 2023-06-29 ENCOUNTER — Ambulatory Visit: Payer: Medicare Other | Admitting: Emergency Medicine

## 2023-07-01 ENCOUNTER — Encounter: Payer: Self-pay | Admitting: Emergency Medicine

## 2023-07-01 ENCOUNTER — Ambulatory Visit: Payer: Medicare Other | Admitting: Emergency Medicine

## 2023-07-01 VITALS — BP 124/76 | HR 91 | Ht 65.0 in | Wt 106.8 lb

## 2023-07-01 DIAGNOSIS — C3491 Malignant neoplasm of unspecified part of right bronchus or lung: Secondary | ICD-10-CM | POA: Diagnosis not present

## 2023-07-01 DIAGNOSIS — J310 Chronic rhinitis: Secondary | ICD-10-CM | POA: Diagnosis not present

## 2023-07-01 DIAGNOSIS — J4489 Other specified chronic obstructive pulmonary disease: Secondary | ICD-10-CM | POA: Diagnosis not present

## 2023-07-01 MED ORDER — BREZTRI AEROSPHERE 160-9-4.8 MCG/ACT IN AERO: INHALATION_SPRAY | RESPIRATORY_TRACT | Status: DC

## 2023-07-01 MED ORDER — BREZTRI AEROSPHERE 160-9-4.8 MCG/ACT IN AERO: 2.0000 | INHALATION_SPRAY | Freq: Two times a day (BID) | RESPIRATORY_TRACT | 11 refills | Status: DC

## 2023-07-01 NOTE — Patient Instructions (Addendum)
 Stop your [name] for now We will try starting Breztri 2 puffs twice a day.  Rinse and gargle after using. If the Markus Daft is not covered by your insurance then we will work on trying to find an adequate substitution. Continue albuterol 2 puffs up to every 4 hours if needed for shortness of breath, chest tightness, wheezing. Continue your Allegra and your nasal sprays as you have been taking them. Would recommend that you reconnect with either the [facility] cancer center or with oncology elsewhere to continue surveillance for your history of lung cancer Follow with Dr Delton Coombes in 6 months or sooner if you have any problems

## 2023-07-01 NOTE — Progress Notes (Signed)
   Subjective:    Patient ID: Elizabeth Mayer, female    DOB: 1960/12/10, 63 y.o.   MRN: [id number]  HPI  ROV 12/29/2022 --63 year old woman with COPD/asthma, stage IV adenocarcinoma (lung, brain mets with history of postobstructive pneumonia).  She has unilateral VC paralysis. She has hypoxemic respiratory failure on 2 L/min with exertion and while sleeping. We had to change her Symbicort to [facility] in May due to insurance. She is doing pretty well. She does have dyspnea with exercise - she is doing PT. She is interested in doing [employer]. She is due for repeat MRI brain soon. Uses albuterol about once a day. She has a daily dry cough. On astelin, allegra  ROV 07/01/2023 --follow-up visit for 63 year old woman with COPD/asthma, stage IV adenocarcinoma, unilateral vocal cord paralysis, hypoxemic respiratory failure. Currently managed on Allegra, Astelin nasal spray, Dulera.  Reports that she was just started by PCP on another nasal spray, ?? nasal steroid. Uses albuterol approximately  She had a fall taking out garbage in January. She is having more sinus drainage and congestion for about 1 month, starting to improve.   Her last CT chest was 05/19/2022, she is not on therapy and hasn't followed back up w Onc since 01/18/23.   Review of Systems As per HPI     Objective:   Physical Exam  Today's Vitals   07/01/23 1408  BP: 124/76  Pulse: 91  SpO2: 100%  Weight: 106 lb 12.8 oz (48.4 kg)  Height: 5\' 5"  (1.651 m)     Body mass index is 17.77 kg/m.;sm  Gen: Pleasant, well-nourished, in no distress,  normal affect  ENT: No lesions,  mouth clear,  oropharynx clear, no postnasal drip, soft voice, hoarse voice  Neck: No JVD, no stridor  Lungs: No use of accessory muscles, no crackles or wheezing on normal respiration, no wheeze on forced expiration  Cardiovascular: RRR, heart sounds normal, no murmur or gallops, no peripheral edema  Musculoskeletal: No deformities, no cyanosis or  clubbing  Neuro: alert, awake, non focal  Skin: Warm, no lesions or rash     Assessment & Plan:  Asthma with COPD (HCC) Stop your [facility] for now We will try starting Breztri 2 puffs twice a day.  Rinse and gargle after using. If the Markus Daft is not covered by your insurance then we will work on trying to find an adequate substitution. Continue albuterol 2 puffs up to every 4 hours if needed for shortness of breath, chest tightness, wheezing. Follow with Dr Delton Coombes in 6 months or sooner if you have any problems  Chronic rhinitis Continue your Allegra and your nasal sprays as you have been taking them.  Non-small cell lung cancer, right (HCC) Stage IV lung cancer.  It does not appear that she is doing any follow-up at the cancer center at this time.  She has seen neuro-oncology as recently as October.  She is considering changing over to oncology in [place].  I have encouraged her to reconnect either at [facility] or in [facility], MD, PhD 07/01/2023, 2:32 PM [facility] [phone] or if no answer [phone]

## 2023-07-01 NOTE — Addendum Note (Signed)
 Addended byFloydene Flock, Syann Cupples A on: 07/01/2023 04:36 PM   Modules accepted: Orders

## 2023-07-01 NOTE — Assessment & Plan Note (Signed)
 Stage IV lung cancer.  It does not appear that she is doing any follow-up at the cancer center at this time.  She has seen neuro-oncology as recently as October.  She is considering changing over to oncology in [place].  I have encouraged her to reconnect either at [facility] or in [facility]

## 2023-07-01 NOTE — Assessment & Plan Note (Signed)
 Continue your Allegra and your nasal sprays as you have been taking them.

## 2023-07-01 NOTE — Addendum Note (Signed)
 Addended by: Leslye Peer on: 07/01/2023 02:34 PM   Modules accepted: Orders

## 2023-07-01 NOTE — Assessment & Plan Note (Signed)
 Stop your [name] for now We will try starting Breztri 2 puffs twice a day.  Rinse and gargle after using. If the Markus Daft is not covered by your insurance then we will work on trying to find an adequate substitution. Continue albuterol 2 puffs up to every 4 hours if needed for shortness of breath, chest tightness, wheezing. Follow with Dr Delton Coombes in 6 months or sooner if you have any problems

## 2023-07-02 ENCOUNTER — Telehealth: Payer: Self-pay | Admitting: *Deleted

## 2023-07-02 NOTE — Telephone Encounter (Signed)
 PC to patient, no answer, left VM - informed her she has an appointment with Dr Barbaraann Cao on 07/19/23 at 9:00 & she needs her MRI scheduled before this appointment.  [facility] phone number given, patient instructed to call & schedule MRI.  Informed patient she may call this office with any questions, [phone].

## 2023-07-06 ENCOUNTER — Telehealth: Payer: Self-pay | Admitting: *Deleted

## 2023-07-06 NOTE — Telephone Encounter (Signed)
 PC to patient, no answer.  PC to patient's brother, Dorene Sorrow, no answer, left VM - informed him we need to schedule brain MRI for patient.  She may call [facility] to schedule, [phone].  Her appointment with Dr Barbaraann Cao is 07/19/23 and MRI needs to be done prior to this visit.  Instructed Dorene Sorrow to contact this office with any questions, [phone].

## 2023-07-15 ENCOUNTER — Ambulatory Visit: Payer: Self-pay | Admitting: Emergency Medicine

## 2023-07-15 ENCOUNTER — Telehealth: Payer: Self-pay | Admitting: *Deleted

## 2023-07-15 ENCOUNTER — Other Ambulatory Visit: Payer: Self-pay

## 2023-07-15 ENCOUNTER — Ambulatory Visit: Payer: Self-pay | Admitting: Nurse Practitioner

## 2023-07-15 ENCOUNTER — Encounter (HOSPITAL_BASED_OUTPATIENT_CLINIC_OR_DEPARTMENT_OTHER): Payer: Self-pay | Admitting: Emergency Medicine

## 2023-07-15 ENCOUNTER — Ambulatory Visit: Admitting: Emergency Medicine

## 2023-07-15 ENCOUNTER — Emergency Department (HOSPITAL_BASED_OUTPATIENT_CLINIC_OR_DEPARTMENT_OTHER)
Admission: EM | Admit: 2023-07-15 | Discharge: 2023-07-15 | Disposition: A | Discharge status: Home / Self Care | Attending: Emergency Medicine | Admitting: Emergency Medicine

## 2023-07-15 DIAGNOSIS — Z7951 Long term (current) use of inhaled steroids: Secondary | ICD-10-CM | POA: Insufficient documentation

## 2023-07-15 DIAGNOSIS — J449 Chronic obstructive pulmonary disease, unspecified: Secondary | ICD-10-CM | POA: Diagnosis not present

## 2023-07-15 DIAGNOSIS — J029 Acute pharyngitis, unspecified: Secondary | ICD-10-CM | POA: Diagnosis present

## 2023-07-15 DIAGNOSIS — Z7189 Other specified counseling: Secondary | ICD-10-CM | POA: Insufficient documentation

## 2023-07-15 LAB — GROUP A STREP BY PCR: Group A Strep by PCR: NOT DETECTED

## 2023-07-15 NOTE — Telephone Encounter (Signed)
 PC to patient & also her brother, Astrid Divine - no answer at either phone, left voicemails on both phones.  Informed patient she has an appointment with Dr Barbaraann Cao on Monday, April 14 at 9:00.  However, her MRI has not been scheduled and her appointment here will need to rescheduled if MRI is not done within next 3 days.  Requested call back from patient or her brother, office number given.

## 2023-07-15 NOTE — ED Notes (Signed)
 Patient ambulatory to nursing station requesting to leave. This RN reported discharge papers were available and patient agreeable to stay while they are printed. No repeat vitals performed as patient was requesting to leave after reviewing paperwork. No acute distress noted and patient ambulatory without difficulty.

## 2023-07-15 NOTE — Discharge Instructions (Signed)
 As discussed use your Breyna inhaler 2 puffs into the lungs 2 times a day and make sure to swish your mouth with water and spit after every use.  Use your albuterol only when you are having wheezing and difficulty breathing.  You may take 1 or 2 puffs as needed every 4-6 hours for coughing and wheezing.  Get help right away if you have any new or worsening symptoms or difficulty breathing.  Make appointment with your eye doctor.

## 2023-07-15 NOTE — Telephone Encounter (Signed)
 Patient canceled appointment to be seen with Dr. Delton Coombes 4/10. NFN

## 2023-07-15 NOTE — ED Triage Notes (Signed)
 On ventolin inhaler. Reports having sore throat  After reading the insert for the inhaler, it says can cause sore throat  Hx of vocal cord dysfunction

## 2023-07-15 NOTE — Telephone Encounter (Signed)
 Chief Complaint: Wheezing a "long time"  Symptoms: sore throat  Disposition: [x] ED [x] Refused Recommended Disposition  Additional Notes: Pt spoke with another nurse with nurse triage this morning. Pt heard wheezing on phone and struggling for breaths. Pt states she is having side effects from her inhaler. Pt states the medication mimics the chemotherapy medication she takes so she couldn't tell it was the reaction from the inhaler. Pt states she cannot make her 1 PM appointment with Dr. Delton Coombes and wants to reschedule it. This RN unable to reschedule as it is recommended that pt goes to ED. This RN called [facility] to notify of pt ED refusal. [facility] states they will call pt.  Reason for Disposition  [1] MODERATE difficulty breathing (e.g., speaks in phrases, SOB even at rest, pulse 100-120) AND [2] NEW-onset or WORSE than normal  Answer Assessment - Initial Assessment Questions Chief Complaint: Wheezing a "long time"  Symptoms: sore throat  Protocols used: Breathing Difficulty-A-AH

## 2023-07-15 NOTE — ED Provider Notes (Signed)
 [place] [facility]MENT AT [facility] Provider Note   CSN: [id number] Arrival date & time: 07/15/23  1435     History  Chief Complaint  Patient presents with   Sore Throat    Elizabeth Mayer is a 63 y.o. female presents emergency department with chief complaint of medication complications.  She has a past medical history of cancer and has previous history of chemotherapy and radiation to her neck with vocal cord paralysis and chronic upper airway obstruction symptoms.  Patient also has a history of COPD and sees Dr. Baldwin Levee.  The patient states that she called the nurse today because she was concerned about symptoms she thinks were due to her use of combined formoterol/budesonide inhaler and albuterol inhaler.  The patient states that she had a sore throat and thought it might be a side effect of the formoterol.  She stopped taking it.  She also has had some headache high blood pressure shakiness when she takes her albuterol which she has been using twice a day but stopped this several days ago.  She called her pulmonology office spoke with one of the nurses who heard her on the phone and states IN HER NOTE:'"Pt heard wheezing on phone and struggling for breathsPt heard wheezing on phone and struggling for breaths." The patient has very loud audible upper airway breath sounds states that she has been breathing and sounding like this for the past 2 years without change and that that is not different nor why she called the office.  Denies that she is having any struggling breathing and states that this is her baseline..  She states that she tried to relay this over the phone to the nurse but was instructed to come to the ER.Aaron Aas  She has no sore throat or other complaints at this time   Sore Throat       Home Medications Prior to Admission medications   Medication Sig Start Date End Date Taking? Authorizing Provider  BREYNA 160-4.5 MCG/ACT inhaler Inhale 2 puffs into the lungs  2 (two) times daily. 07/11/23  Yes [provider]  tiZANidine (ZANAFLEX) 2 MG tablet Take 1 tablet by mouth every 12 (twelve) hours. 06/29/23  Yes [provider]  acetaminophen (TYLENOL) 500 MG tablet Take 1,000 mg by mouth every 6 (six) hours as needed for moderate pain.    [provider]  ALPRAZolam (XANAX) 1 MG tablet Take 1 tablet (1 mg total) by mouth once as needed for up to 1 dose (mri claustrophobia). Take 45 minutes before MRI study 07/28/22   Vaslow, Zachary K, MD  azelastine (ASTELIN) 0.1 % nasal spray Place 2 sprays into both nostrils 2 (two) times daily. Use in each nostril as directed    [provider]  benzoyl peroxide (CERAVE ACNE FOAMING CREAM) 4 % external liquid Apply 1 application topically 2 (two) times daily. Face    [provider]  Blood Pressure Monitoring (BLOOD PRESSURE MONITOR AUTOMAT) DEVI Check Blood pressure at home daily 06/13/21   Thayil, Irene T, PA-C  budeson-glycopyrrolate-formoterol (BREZTRI AEROSPHERE) 160-9-4.8 MCG/ACT AERO Inhale 2 puffs into the lungs in the morning and at bedtime. 07/01/23   Denson Flake, MD  budeson-glycopyrrolate-formoterol (BREZTRI AEROSPHERE) 160-9-4.8 MCG/ACT AERO 2 samples provided 07/01/23   Byrum, Robert S, MD  camphor-menthol [facility]) lotion Apply 1 Application topically as needed for itching.    [provider]  cetaphil (CETAPHIL) lotion Apply 1 application  topically 2 (two) times daily.  [provider]  clindamycin (CLEOCIN T) 1 % external solution Apply 1 application topically 2 (two) times daily. 09/20/20   [provider]  diltiazem (CARDIZEM) 30 MG tablet Take 1 tablet (30 mg total) by mouth 4 (four) times daily as needed (afib episodes). 02/12/22   Asa Lauth, NP  fexofenadine (ALLEGRA) 180 MG tablet Take 180 mg by mouth in the morning.    [provider]  folic acid (FOLVITE) 1 MG tablet Take 1 tablet (1 mg total) by mouth daily. Patient  not taking: Reported on 07/01/2023 07/25/21   Dorsey, John T IV, MD  HYDROcodone-acetaminophen (NORCO/VICODIN) 5-325 MG tablet Take 1 tablet by mouth every 6 (six) hours as needed for moderate pain. Patient not taking: Reported on 07/01/2023 02/07/21   Rogerio Clay IV, MD  ipratropium (ATROVENT) 0.03 % nasal spray Place 2 sprays into both nostrils 3 (three) times daily.    [provider]  lidocaine-prilocaine (EMLA) cream Apply 1 application topically as needed. Patient not taking: Reported on 07/01/2023 01/25/20   Heilingoetter, Cassandra L, PA-C  Menthol, Topical Analgesic, (ICY HOT EX) Apply 1 application  topically daily as needed (feet pain).    [provider]  Multiple Minerals-Vitamins (CAL MAG ZINC +D3) TABS Take 3 capsules by mouth daily.    [provider]  Multiple Vitamins-Minerals (ALIVE MULTI-VITAMIN PO) Take 1 tablet by mouth daily.    [provider]  ondansetron (ZOFRAN) 8 MG tablet TAKE 1 TABLET BY MOUTH EVERY 8 HOURS AS NEEDED FOR NAUSEA OR VOMITING Patient not taking: Reported on 07/01/2023 10/09/21   Dorsey, John T IV, MD  Polyethyl Glycol-Propyl Glycol (LUBRICANT EYE DROPS) 0.4-0.3 % SOLN Apply 1-2 drops to eye 3 (three) times daily as needed (dry/irritated eyes).    [provider]  triamcinolone ointment (KENALOG) 0.1 % Apply 1 application  topically at bedtime. 05/14/20   [provider]  VENTOLIN HFA 108 (90 Base) MCG/ACT inhaler INHALE 1 TO 2 PUFFS BY MOUTH EVERY 6 HOURS AS NEEDED FOR WHEEZING FOR SHORTNESS OF BREATH 09/16/22   Byrum, Robert S, MD      Allergies    Sudafed [pseudoephedrine], Amlodipine, Atorvastatin, Gabapentin, Mobic [meloxicam], Penicillins, and Prednisone    Review of Systems   Review of Systems  Physical Exam Updated Vital Signs BP (!) 151/83 (BP Location: Right Arm)   Pulse 97   Temp 97.9 F (36.6 C)   Resp 20   LMP  (LMP Unknown)   SpO2 99%  Physical Exam Vitals and nursing note reviewed.   Constitutional:      General: She is not in acute distress.    Appearance: She is well-developed. She is not diaphoretic.  HENT:     Head: Normocephalic and atraumatic.     Right Ear: External ear normal.     Left Ear: External ear normal.     Nose: Nose normal.     Mouth/Throat:     Mouth: Mucous membranes are moist.  Eyes:     General: No scleral icterus.    Conjunctiva/sclera: Conjunctivae normal.  Cardiovascular:     Rate and Rhythm: Normal rate and regular rhythm.     Heart sounds: Normal heart sounds. No murmur heard.    No friction rub. No gallop.  Pulmonary:     Effort: Pulmonary effort is normal. No respiratory distress.     Breath sounds: Normal breath sounds.     Comments: Transmitted upper airway sounds only, no wheezing no rhonchi. Abdominal:  General: Bowel sounds are normal. There is no distension.     Palpations: Abdomen is soft. There is no mass.     Tenderness: There is no abdominal tenderness. There is no guarding.  Musculoskeletal:     Cervical back: Normal range of motion.  Skin:    General: Skin is warm and dry.  Neurological:     Mental Status: She is alert and oriented to person, place, and time.  Psychiatric:        Behavior: Behavior normal.     ED Results / Procedures / Treatments   Labs (all labs ordered are listed, but only abnormal results are displayed) Labs Reviewed  GROUP A STREP BY PCR    EKG None  Radiology No results found.  Procedures Procedures    Medications Ordered in ED Medications - No data to display  ED Course/ Medical Decision Making/ A&P                                 Medical Decision Making  Patient with chronic upper airway obstruction symptoms and vocal cord paralysis.  I had a long discussion with the patient giving her patient education about the medications she uses for inhalation.  She does not have to take albuterol twice a day I have instructed her to take it as needed for wheezing.  Currently  the symptoms she is having are only her upper airway chronic symptoms.  I also discussed that the type of sore throat she would get from budesonide would be thrush but she is not having any signs or symptoms of that I did discuss that she would need to wash her mouth out every time she uses an inhaled corticosteroid which is twice a day.  I also discussed that the symptoms she was having shakiness, headaches or all side effects of Ventolin but that if she needs it to breathe it is still the preferred medication she used it is not an allergic reaction. Patient given patient education.  She has been having some increased blurry vision she was afraid this was due to eye pressure.  She has no eye complaints today advised her to follow-up with an eye doctor.  Discussed outpatient follow-up and return precautions.        Final Clinical Impression(s) / ED Diagnoses Final diagnoses:  None    Rx / DC Orders ED Discharge Orders     None         Tama Fails, PA-C 07/15/23 1809    Quinn Bucco, DO 07/21/23 1614

## 2023-07-15 NOTE — Telephone Encounter (Signed)
 Chief Complaint: wheezing Symptoms: SOB, congestion, dizziness, eye pressure Frequency: yesterday Pertinent Negatives: Patient denies CP, fever Disposition: [x] ED /[] Urgent Care (no appt availability in office) / [] Appointment(In office/virtual)/ []  [facility]/ [] Home Care/ [x] Refused Recommended Disposition /[] [place] [facility]/ []  Follow-up with PCP Additional Notes: Pt c/o wheezing, SOB, congestion that may be side effects of INH she has been taking. Pt also endorsed eye pressure, dizziness that has improved from not taking INH last night. Of note, pt audibly wheezing, speaking in phrases during triage. Pt reports that she is unsure if symptoms are r/t radiation injury vs. Medication side effect. Due to audible wheezing, and inability to use rescue INH (d/t possible worsening of SE), triager advised ED/911 for further evaluation/tx. Pt insisted on forwarding message to Dr. Delton Coombes. Triager will forward encounter for Dr. Delton Coombes to review and advise. Triager called [facility] and notified [facility] of ED refusal.    Copied from CRM [id number]. Topic: Clinical - Prescription Issue >> Jul 15, 2023 10:47 AM Brennan Bailey S wrote: Reason for CRM: patient is experiencing heavy side affects from her inhaler, sore throat, eye strain, dizziness, extreme wheezing. Reason for Disposition  Wheezing can be heard across the room  Answer Assessment - Initial Assessment Questions E2C2 Pulmonary Triage - Initial Assessment Questions "Chief Complaint (e.g., cough, sob, wheezing, fever, chills, sweat or additional symptoms) *Go to specific symptom protocol after initial questions. Possible SE from INH - shakiness, nervousness, dizziness, bilateral eye pressure R > L Sore throat, audible wheezing  "How long have symptoms been present?" yesterday   MEDICINES:     "Have you used your inhalers/maintenance medication?" Yes If yes, "What medications?" Ventolin Breyna  Reports did not take INH last  night - with improvement of sore throat, eye pressure  OXYGEN: "Do you wear supplemental oxygen?" No If yes, "How many liters are you supposed to use?" N/a  "Do you monitor your oxygen levels?" Yes If yes, "What is your reading (oxygen level) today?" 97% 92 HR  "What is your usual oxygen saturation reading?"  (Note: Pulmonary O2 sats should be 90% or greater) Upper 90s   1. NAME of MEDICINE: "What medicine(s) are you calling about?"     Ventolin Breyna 2. QUESTION: "What is your question?" (e.g., double dose of medicine, side effect)     Side effects 3. PRESCRIBER: "Who prescribed the medicine?" Reason: if prescribed by specialist, call should be referred to that group.     byrum 4. SYMPTOMS: "Do you have any symptoms?" If Yes, ask: "What symptoms are you having?"  "How bad are the symptoms (e.g., mild, moderate, severe)     See above  Answer Assessment - Initial Assessment Questions 1. RESPIRATORY STATUS: "Describe your breathing?" (e.g., wheezing, shortness of breath, unable to speak, severe coughing)      wheezing 2. ONSET: "When did this breathing problem begin?"      yesterday 3. PATTERN "Does the difficult breathing come and go, or has it been constant since it started?"      constant 4. SEVERITY: "How bad is your breathing?" (e.g., mild, moderate, severe)    - MILD: No SOB at rest, mild SOB with walking, speaks normally in sentences, can lie down, no retractions, pulse < 100.    - MODERATE: SOB at rest, SOB with minimal exertion and prefers to sit, cannot lie down flat, speaks in phrases, mild retractions, audible wheezing, pulse 100-120.    - SEVERE: Very SOB at rest, speaks in single words, struggling to  breathe, sitting hunched forward, retractions, pulse > 120      Moderate-severe Audible wheezing noted during call 5. RECURRENT SYMPTOM: "Have you had difficulty breathing before?" If Yes, ask: "When was the last time?" and "What happened that time?"      unknown 6.  CARDIAC HISTORY: "Do you have any history of heart disease?" (e.g., heart attack, angina, bypass surgery, angioplasty)      denies 7. LUNG HISTORY: "Do you have any history of lung disease?"  (e.g., pulmonary embolus, asthma, emphysema)     COPD, lung CA 8. CAUSE: "What do you think is causing the breathing problem?"      unknown 9. OTHER SYMPTOMS: "Do you have any other symptoms? (e.g., dizziness, runny nose, cough, chest pain, fever)     Dizziness, runny nose - pt reports unsure if sx are side effects or r/t radiation injury (last radiation tx 4-5 years ago) Denies CP, fever  Protocols used: Medication Question Call-A-AH, Breathing Difficulty-A-AH

## 2023-07-16 ENCOUNTER — Telehealth: Payer: Self-pay | Admitting: *Deleted

## 2023-07-16 ENCOUNTER — Encounter: Payer: Self-pay | Admitting: *Deleted

## 2023-07-16 NOTE — Telephone Encounter (Signed)
 PC to patient, no answer, left VM - informed patient I canceled her appointment on Monday, 07/19/23, because her MRI hasn't been done.  MRI has been scheduled at [facility] on 07/28/23 at 11:50, she is to arrive at 11:20.  [facility] requires 24 hour notice to change appointments.  Also informed patient her appointment with Dr Barbaraann Cao is 07/29/23 at 10:00 to discuss MRI results.  [facility] & [facility] phone numbers given if patient needs to change appointments.

## 2023-07-19 ENCOUNTER — Ambulatory Visit: Payer: Medicare Other | Admitting: Internal Medicine

## 2023-07-21 ENCOUNTER — Other Ambulatory Visit: Payer: Self-pay | Admitting: Radiation Therapy

## 2023-07-26 ENCOUNTER — Encounter: Payer: Self-pay | Admitting: Nurse Practitioner

## 2023-07-26 ENCOUNTER — Ambulatory Visit: Admitting: Nurse Practitioner

## 2023-07-26 ENCOUNTER — Other Ambulatory Visit: Payer: Self-pay | Admitting: Nurse Practitioner

## 2023-07-26 VITALS — Temp 97.7°F | Ht 65.0 in | Wt 106.2 lb

## 2023-07-26 DIAGNOSIS — C3491 Malignant neoplasm of unspecified part of right bronchus or lung: Secondary | ICD-10-CM

## 2023-07-26 DIAGNOSIS — I10 Essential (primary) hypertension: Secondary | ICD-10-CM

## 2023-07-26 DIAGNOSIS — J029 Acute pharyngitis, unspecified: Secondary | ICD-10-CM | POA: Diagnosis not present

## 2023-07-26 DIAGNOSIS — B370 Candidal stomatitis: Secondary | ICD-10-CM

## 2023-07-26 DIAGNOSIS — R636 Underweight: Secondary | ICD-10-CM

## 2023-07-26 DIAGNOSIS — C7931 Secondary malignant neoplasm of brain: Secondary | ICD-10-CM

## 2023-07-26 DIAGNOSIS — R634 Abnormal weight loss: Secondary | ICD-10-CM

## 2023-07-26 DIAGNOSIS — Z1231 Encounter for screening mammogram for malignant neoplasm of breast: Secondary | ICD-10-CM | POA: Insufficient documentation

## 2023-07-26 DIAGNOSIS — I480 Paroxysmal atrial fibrillation: Secondary | ICD-10-CM

## 2023-07-26 DIAGNOSIS — R42 Dizziness and giddiness: Secondary | ICD-10-CM | POA: Diagnosis not present

## 2023-07-26 DIAGNOSIS — Z1211 Encounter for screening for malignant neoplasm of colon: Secondary | ICD-10-CM

## 2023-07-26 DIAGNOSIS — Z681 Body mass index (BMI) 19 or less, adult: Secondary | ICD-10-CM

## 2023-07-26 DIAGNOSIS — I4891 Unspecified atrial fibrillation: Secondary | ICD-10-CM | POA: Insufficient documentation

## 2023-07-26 DIAGNOSIS — R7303 Prediabetes: Secondary | ICD-10-CM

## 2023-07-26 MED ORDER — MAGIC MOUTHWASH: 5.0000 mL | Freq: Three times a day (TID) | ORAL | 0 refills | Status: DC

## 2023-07-26 NOTE — Progress Notes (Addendum)
 Del Favia, CMA,acting as a Neurosurgeon for [name], FNP.,have documented all relevant documentation on the behalf of [name], FNP,as directed by  [name], FNP while in the presence of [name], FNP.  Subjective:  Patient ID: Elizabeth Mayer , female    DOB: 1960/08/08 , 63 y.o.   MRN: [id number]  No chief complaint on file.   HPI  Patient presents today for thrush in her mouth. She reports she was using her inhaler and didn't wash her mouth out after. She reports she was never told to wash her mouth. She reports she went to the ER for the thrush and was not giving anything because she didn't have any thrush present at the time for them to swab. Patient reports she also can not smell or taste, she states this was a gradual thing but she noticed it more after her fall in January. She reports she is not eating as much now. She reports she has a sore throat as well that started in January, she report at first it was on and off pains. She is rinsing her mouth She is trying to get scheduled with the cancer doctor in [place].  She has had her hearing tested and caused "a body imbalance"  She has a decreased appetite.  She does not feel she needs 3-4 month f/u for her brain mets.   She also reports on and off again headaches.       Past Medical History:  Diagnosis Date   Anemia    Angina 1982   related to stress   Asthma    in the past,  no current problems   Atrial fibrillation (HCC)    Complication of anesthesia    states anesthesia made her hair fall out, old meds. hard to awaken 1 time she took Flexeril  before.   Constipation    COPD (chronic obstructive pulmonary disease) (HCC)    Dyspnea    on oxygen  at home - 3L via [place]   Dysrhythmia    Fall 06/01/2023   Fatigue    GERD (gastroesophageal reflux disease)    patient denies this dx   Heart murmur 1970s   no problems currently   Hypertension    Ingrown toenail    Lung cancer (HCC) 10/2019   metastatic disease to  the brain   On home oxygen  therapy    2L via [place] - 24 hours a day   Past heart attack 1980-1981   pt states she passed out and woke up in hospital- told she had heart attack, but then dr said he couldn't find anything wrong.   Peripheral vascular disease (HCC)    Pneumonia    x 1     Family History  Problem Relation Age of Onset   Arthritis Mother    Heart failure Father      Current Outpatient Medications:    acetaminophen  (TYLENOL ) 500 MG tablet, Take 1,000 mg by mouth every 6 (six) hours as needed for moderate pain., Disp: , Rfl:    ALPRAZolam  (XANAX ) 1 MG tablet, Take 1 tablet (1 mg total) by mouth once as needed for up to 1 dose (mri claustrophobia). Take 45 minutes before MRI study, Disp: 3 tablet, Rfl: 0   azelastine (ASTELIN) 0.1 % nasal spray, Place 2 sprays into both nostrils 2 (two) times daily. Use in each nostril as directed, Disp: , Rfl:    benzoyl peroxide (CERAVE ACNE FOAMING CREAM) 4 % external liquid, Apply 1 application  topically 2 (two) times daily. Face, Disp: , Rfl:    Blood Pressure Monitoring (BLOOD PRESSURE MONITOR AUTOMAT) DEVI, Check Blood pressure at home daily, Disp: 1 each, Rfl: 0   BREYNA  160-4.5 MCG/ACT inhaler, Inhale 2 puffs into the lungs 2 (two) times daily., Disp: , Rfl:    budeson-glycopyrrolate-formoterol  (BREZTRI  AEROSPHERE) 160-9-4.8 MCG/ACT AERO, Inhale 2 puffs into the lungs in the morning and at bedtime., Disp: 10.7 g, Rfl: 11   budeson-glycopyrrolate-formoterol  (BREZTRI  AEROSPHERE) 160-9-4.8 MCG/ACT AERO, 2 samples provided, Disp: 2 each, Rfl:    camphor-menthol (SARNA) lotion, Apply 1 Application topically as needed for itching., Disp: , Rfl:    cetaphil (CETAPHIL) lotion, Apply 1 application  topically 2 (two) times daily., Disp: , Rfl:    clindamycin  (CLEOCIN  T) 1 % external solution, Apply 1 application topically 2 (two) times daily., Disp: , Rfl:    diltiazem  (CARDIZEM ) 30 MG tablet, Take 1 tablet (30 mg total) by mouth 4 (four) times  daily as needed (afib episodes)., Disp: 45 tablet, Rfl: 1   fexofenadine (ALLEGRA) 180 MG tablet, Take 180 mg by mouth in the morning., Disp: , Rfl:    ipratropium (ATROVENT) 0.03 % nasal spray, Place 2 sprays into both nostrils 3 (three) times daily., Disp: , Rfl:    Menthol, Topical Analgesic, (ICY HOT EX), Apply 1 application  topically daily as needed (feet pain)., Disp: , Rfl:    Multiple Minerals-Vitamins (CAL MAG ZINC +D3) TABS, Take 3 capsules by mouth daily., Disp: , Rfl:    Multiple Vitamins-Minerals (ALIVE MULTI-VITAMIN PO), Take 1 tablet by mouth daily., Disp: , Rfl:    ondansetron  (ZOFRAN ) 8 MG tablet, TAKE 1 TABLET BY MOUTH EVERY 8 HOURS AS NEEDED FOR NAUSEA OR VOMITING, Disp: 20 tablet, Rfl: 0   tiZANidine (ZANAFLEX) 2 MG tablet, Take 1 tablet by mouth every 12 (twelve) hours., Disp: , Rfl:    triamcinolone ointment (KENALOG) 0.1 %, Apply 1 application  topically at bedtime., Disp: , Rfl:    VENTOLIN  HFA 108 (90 Base) MCG/ACT inhaler, INHALE 1 TO 2 PUFFS BY MOUTH EVERY 6 HOURS AS NEEDED FOR WHEEZING FOR SHORTNESS OF BREATH, Disp: 18 g, Rfl: 3   folic acid  (FOLVITE ) 1 MG tablet, Take 1 tablet (1 mg total) by mouth daily. (Patient not taking: Reported on 07/01/2023), Disp: 90 tablet, Rfl: 1   HYDROcodone -acetaminophen  (NORCO/VICODIN) 5-325 MG tablet, Take 1 tablet by mouth every 6 (six) hours as needed for moderate pain. (Patient not taking: Reported on 07/01/2023), Disp: 30 tablet, Rfl: 0   lidocaine -prilocaine  (EMLA ) cream, Apply 1 application topically as needed. (Patient not taking: Reported on 07/01/2023), Disp: 30 g, Rfl: 2   magic mouthwash (lidocaine , diphenhydrAMINE, alum & mag hydroxide) suspension, Swish and swallow 5 mLs 3 (three) times daily., Disp: 360 mL, Rfl: 1   magic mouthwash SOLN, Take 5 mLs by mouth 3 (three) times daily. Suspension contains equal amounts of Maalox Extra Strength, nystatin, and diphenhydramine., Disp: 120 mL, Rfl: 0   Allergies  Allergen Reactions    Sudafed [Pseudoephedrine] Hypertension   Amlodipine  Swelling   Atorvastatin  Other (See Comments)    Cramping   Gabapentin Other (See Comments)    Raise blood pressure and red rings around eyes Blood vessels popped in her eyes   Mobic  [Meloxicam ] Swelling    Inflamed the area that has inflammation and stabbing pains in the area   Penicillins Hives     Childhood reaction   Prednisone      Caused A-fib  Review of Systems  Constitutional: Negative.   Respiratory: Negative.    Cardiovascular: Negative.   Genitourinary: Negative.   Neurological:  Positive for dizziness and light-headedness.  Psychiatric/Behavioral: Negative.       Today's Vitals   07/26/23 1213  Temp: 97.7 F (36.5 C)  TempSrc: Oral  Weight: 106 lb 3.2 oz (48.2 kg)  Height: 5\' 5"  (1.651 m)  PainSc: 0-No pain   Body mass index is 17.67 kg/m.  Wt Readings from Last 3 Encounters:  07/26/23 106 lb 3.2 oz (48.2 kg)  07/01/23 106 lb 12.8 oz (48.4 kg)  06/01/23 115 lb 9.6 oz (52.4 kg)    Objective:  Physical Exam Vitals and nursing note reviewed.  Constitutional:      General: She is not in acute distress.    Appearance: Normal appearance. She is underweight.  HENT:     Head: Normocephalic.     Mouth/Throat:     Mouth: Mucous membranes are moist.     Pharynx: No oropharyngeal exudate or posterior oropharyngeal erythema.  Cardiovascular:     Rate and Rhythm: Normal rate and regular rhythm.     Pulses: Normal pulses.     Heart sounds: Normal heart sounds. No murmur heard. Pulmonary:     Effort: Pulmonary effort is normal. No respiratory distress.     Breath sounds: Normal breath sounds. No wheezing.  Neurological:     General: No focal deficit present.     Mental Status: She is alert and oriented to person, place, and time.     Cranial Nerves: No cranial nerve deficit.     Motor: No weakness.  Psychiatric:        Mood and Affect: Mood normal.        Behavior: Behavior normal.        Thought  Content: Thought content normal.        Judgment: Judgment normal.      Assessment And Plan:  Thrush Assessment & Plan: I do not see any abnormal findings in her mouth but will treat with miracle mouthwash   Sore throat  Dizziness Assessment & Plan: She is encouraged to stay well hydrated with water. She is not eating well.   Orders: -     TSH -     CBC with Differential/Platelet  Abnormal weight loss Assessment & Plan: She continues to have some mild weight loss but is stable. I have expressed my concern to her as she needs to follow up with the Oncologist  Orders: -     TSH  Underweight (BMI < 18.5) Assessment & Plan: I am concerned about her weight loss, my concern is related to possible advancing cancer as she has not had any treatment in several months.    Essential hypertension Assessment & Plan: Blood pressure is stable.   Orders: -     BMP8+eGFR  Prediabetes Assessment & Plan: Hgba1c is stable. Will check today. Continue focusing on healthy diet and exercise.   Orders: -     BMP8+eGFR -     Hemoglobin A1c  PAF (paroxysmal atrial fibrillation) (HCC) Assessment & Plan: Was related to a chemo medication that she was administered, she is not taking any medications for prevention   Malignant neoplasm metastatic to brain [facility]) Assessment & Plan: She is planning to go to the Oncologist at [facility]. She is to have routine MRI of brain. I have mentioned to her that [name] has an appt in the system for her to have  an MRI later this month and she reports she does not plan on going due to being uncomfortable with her last visit. Currently not on any treatment   Non-small cell lung cancer, right [facility]) Assessment & Plan: She is planning to go to the Oncologist at [facility]. Currently not on any treatment   Encounter for screening mammogram for breast cancer Assessment & Plan: Pt instructed on Self Breast Exam.According to ACOG guidelines Women aged  10 and older are recommended to get an annual mammogram.    Orders: -     3D Screening Mammogram, Left and Right; Future  Encounter for screening colonoscopy Assessment & Plan: According to USPTF Colorectal cancer Screening guidelines. Colonoscopy is recommended every 10 years, starting at age 5 years. Will refer to GI for colon cancer screening.   Orders: -     Ambulatory referral to Gastroenterology    No follow-ups on file.  Patient was given opportunity to ask questions. Patient verbalized understanding of the plan and was able to repeat key elements of the plan. All questions were answered to their satisfaction.    Inge Mangle, FNP, have reviewed all documentation for this visit. The documentation on 07/26/23 for the exam, diagnosis, procedures, and orders are all accurate and complete.   IF YOU HAVE BEEN REFERRED TO A SPECIALIST, IT MAY TAKE 1-2 WEEKS TO SCHEDULE/PROCESS THE REFERRAL. IF YOU HAVE NOT HEARD FROM US /SPECIALIST IN TWO WEEKS, PLEASE GIVE US  A CALL AT [phone].

## 2023-07-27 ENCOUNTER — Encounter: Payer: Self-pay | Admitting: Nurse Practitioner

## 2023-07-27 ENCOUNTER — Other Ambulatory Visit: Payer: Self-pay | Admitting: Nurse Practitioner

## 2023-07-27 ENCOUNTER — Ambulatory Visit: Payer: Self-pay

## 2023-07-27 ENCOUNTER — Encounter: Payer: Self-pay | Admitting: Hematology and Oncology

## 2023-07-27 LAB — CBC WITH DIFFERENTIAL/PLATELET
Basophils Absolute: 0 10*3/uL (ref 0.0–0.2)
Basos: 1 %
EOS (ABSOLUTE): 0.1 10*3/uL (ref 0.0–0.4)
Eos: 2 %
Hematocrit: 36.4 % (ref 34.0–46.6)
Hemoglobin: 11.7 g/dL (ref 11.1–15.9)
Immature Grans (Abs): 0 10*3/uL (ref 0.0–0.1)
Immature Granulocytes: 0 %
Lymphocytes Absolute: 1.2 10*3/uL (ref 0.7–3.1)
Lymphs: 23 %
MCH: 29.5 pg (ref 26.6–33.0)
MCHC: 32.1 g/dL (ref 31.5–35.7)
MCV: 92 fL (ref 79–97)
Monocytes Absolute: 0.4 10*3/uL (ref 0.1–0.9)
Monocytes: 8 %
Neutrophils Absolute: 3.6 10*3/uL (ref 1.4–7.0)
Neutrophils: 66 %
Platelets: 166 10*3/uL (ref 150–450)
RBC: 3.97 x10E6/uL (ref 3.77–5.28)
RDW: 13.5 % (ref 11.7–15.4)
WBC: 5.4 10*3/uL (ref 3.4–10.8)

## 2023-07-27 LAB — BMP8+EGFR
BUN/Creatinine Ratio: 20 (ref 12–28)
BUN: 16 mg/dL (ref 8–27)
CO2: 27 mmol/L (ref 20–29)
Calcium: 9.8 mg/dL (ref 8.7–10.3)
Chloride: 99 mmol/L (ref 96–106)
Creatinine, Ser: 0.81 mg/dL (ref 0.57–1.00)
Glucose: 84 mg/dL (ref 70–99)
Potassium: 4.6 mmol/L (ref 3.5–5.2)
Sodium: 142 mmol/L (ref 134–144)
eGFR: 81 mL/min/{1.73_m2} (ref >59)

## 2023-07-27 LAB — TSH: TSH: 1.67 u[IU]/mL (ref 0.450–4.500)

## 2023-07-27 LAB — HEMOGLOBIN A1C
Est. average glucose Bld gHb Est-mCnc: 117 mg/dL
Hgb A1c MFr Bld: 5.7 % — ABNORMAL HIGH (ref 4.8–5.6)

## 2023-07-27 NOTE — Telephone Encounter (Signed)
 [facility] pharmacy called to state they do not carry the magic mouthwash prescription. This RN contacted patient to ask what pharmacy she would like it sent to. Patient states to please send magic mouthwash prescription to   [facility] [address] [place], [place]

## 2023-07-28 ENCOUNTER — Other Ambulatory Visit: Payer: Self-pay

## 2023-07-28 ENCOUNTER — Other Ambulatory Visit

## 2023-07-28 DIAGNOSIS — J029 Acute pharyngitis, unspecified: Secondary | ICD-10-CM

## 2023-07-28 DIAGNOSIS — B370 Candidal stomatitis: Secondary | ICD-10-CM

## 2023-07-28 MED ORDER — LIDOCAINE VISCOUS HCL 2 % MT SOLN: 5.0000 mL | Freq: Three times a day (TID) | OROMUCOSAL | 1 refills | Status: DC

## 2023-07-29 ENCOUNTER — Telehealth: Payer: Self-pay | Admitting: *Deleted

## 2023-07-29 ENCOUNTER — Inpatient Hospital Stay: Admitting: Internal Medicine

## 2023-07-29 NOTE — Telephone Encounter (Signed)
 PC to patient regarding appointment this morning, no answer, left VM - informed patient we need to cancel her appointment today because her MRI hasn't been done.  Also PC to patient's brother, Elizabeth Mayer, to verify patient's phone number - he states phone number is correct, states patient doesn't always answer her phone.

## 2023-08-02 ENCOUNTER — Encounter

## 2023-08-02 ENCOUNTER — Encounter: Payer: Self-pay | Admitting: Nurse Practitioner

## 2023-08-02 NOTE — Assessment & Plan Note (Signed)
Blood pressure is stable 

## 2023-08-02 NOTE — Assessment & Plan Note (Signed)
 She is planning to go to the Oncologist at [facility]. Currently not on any treatment

## 2023-08-02 NOTE — Assessment & Plan Note (Signed)
 According to USPTF Colorectal cancer Screening guidelines. Colonoscopy is recommended every 10 years, starting at age 65 years. Will refer to GI for colon cancer screening.

## 2023-08-02 NOTE — Assessment & Plan Note (Signed)
 She is encouraged to stay well hydrated with water. She is not eating well.

## 2023-08-02 NOTE — Assessment & Plan Note (Signed)
 She is planning to go to the Oncologist at [facility]. She is to have routine MRI of brain. I have mentioned to her that [name] has an appt in the system for her to have an MRI later this month and she reports she does not plan on going due to being uncomfortable with her last visit. Currently not on any treatment

## 2023-08-02 NOTE — Assessment & Plan Note (Signed)
 I am concerned about her weight loss, my concern is related to possible advancing cancer as she has not had any treatment in several months.

## 2023-08-02 NOTE — Assessment & Plan Note (Signed)
 I do not see any abnormal findings in her mouth but will treat with miracle mouthwash

## 2023-08-02 NOTE — Assessment & Plan Note (Deleted)
 I am concerned about her weight loss, my concern is related to possible advancing cancer as she has not had any treatment in several months.

## 2023-08-02 NOTE — Assessment & Plan Note (Signed)
 Hgba1c is stable. Will check today. Continue focusing on healthy diet and exercise.

## 2023-08-04 ENCOUNTER — Telehealth: Payer: Self-pay

## 2023-08-04 NOTE — Telephone Encounter (Signed)
 Called patient to see  if she is willing to do an MRI of her brain. See if she has called the Oncologist in [place]. Provider is concerned about her continued weight loss

## 2023-08-04 NOTE — Progress Notes (Signed)
 Done

## 2023-08-12 NOTE — Progress Notes (Signed)
 I have updated the PAF - this was related to her chemo treatment she is not taking any medications

## 2023-08-12 NOTE — Assessment & Plan Note (Signed)
 Was related to a chemo medication that she was administered, she is not taking any medications for prevention

## 2023-08-12 NOTE — Assessment & Plan Note (Signed)
 She continues to have some mild weight loss but is stable. I have expressed my concern to her as she needs to follow up with the Oncologist

## 2023-08-12 NOTE — Assessment & Plan Note (Signed)
Pt instructed on Self Breast Exam.According to ACOG guidelines Women aged 65 and older are recommended to get an annual mammogram.   

## 2023-08-30 ENCOUNTER — Other Ambulatory Visit: Payer: Self-pay

## 2023-08-31 ENCOUNTER — Ambulatory Visit: Payer: Medicare Other | Admitting: Nurse Practitioner

## 2023-09-19 ENCOUNTER — Emergency Department (HOSPITAL_COMMUNITY)

## 2023-09-19 ENCOUNTER — Encounter (HOSPITAL_COMMUNITY): Payer: Self-pay

## 2023-09-19 ENCOUNTER — Inpatient Hospital Stay (HOSPITAL_COMMUNITY)
Admission: EM | Admit: 2023-09-19 | Discharge: 2023-10-06 | DRG: 871 | Disposition: A | Discharge status: Hospice | Attending: Student | Admitting: Student

## 2023-09-19 DIAGNOSIS — F419 Anxiety disorder, unspecified: Secondary | ICD-10-CM | POA: Diagnosis present

## 2023-09-19 DIAGNOSIS — R627 Adult failure to thrive: Secondary | ICD-10-CM | POA: Diagnosis not present

## 2023-09-19 DIAGNOSIS — J440 Chronic obstructive pulmonary disease with acute lower respiratory infection: Secondary | ICD-10-CM | POA: Diagnosis present

## 2023-09-19 DIAGNOSIS — Z1152 Encounter for screening for COVID-19: Secondary | ICD-10-CM | POA: Diagnosis not present

## 2023-09-19 DIAGNOSIS — E870 Hyperosmolality and hypernatremia: Secondary | ICD-10-CM | POA: Diagnosis not present

## 2023-09-19 DIAGNOSIS — J189 Pneumonia, unspecified organism: Principal | ICD-10-CM | POA: Diagnosis present

## 2023-09-19 DIAGNOSIS — R1310 Dysphagia, unspecified: Secondary | ICD-10-CM | POA: Diagnosis not present

## 2023-09-19 DIAGNOSIS — I2489 Other forms of acute ischemic heart disease: Secondary | ICD-10-CM | POA: Diagnosis present

## 2023-09-19 DIAGNOSIS — E1151 Type 2 diabetes mellitus with diabetic peripheral angiopathy without gangrene: Secondary | ICD-10-CM | POA: Diagnosis present

## 2023-09-19 DIAGNOSIS — Z8261 Family history of arthritis: Secondary | ICD-10-CM

## 2023-09-19 DIAGNOSIS — J9621 Acute and chronic respiratory failure with hypoxia: Secondary | ICD-10-CM | POA: Diagnosis present

## 2023-09-19 DIAGNOSIS — I10 Essential (primary) hypertension: Secondary | ICD-10-CM | POA: Diagnosis present

## 2023-09-19 DIAGNOSIS — R531 Weakness: Secondary | ICD-10-CM

## 2023-09-19 DIAGNOSIS — I959 Hypotension, unspecified: Secondary | ICD-10-CM | POA: Diagnosis present

## 2023-09-19 DIAGNOSIS — J690 Pneumonitis due to inhalation of food and vomit: Secondary | ICD-10-CM | POA: Diagnosis present

## 2023-09-19 DIAGNOSIS — J449 Chronic obstructive pulmonary disease, unspecified: Secondary | ICD-10-CM | POA: Diagnosis not present

## 2023-09-19 DIAGNOSIS — R0602 Shortness of breath: Secondary | ICD-10-CM | POA: Diagnosis not present

## 2023-09-19 DIAGNOSIS — D72829 Elevated white blood cell count, unspecified: Secondary | ICD-10-CM | POA: Diagnosis present

## 2023-09-19 DIAGNOSIS — Z681 Body mass index (BMI) 19 or less, adult: Secondary | ICD-10-CM

## 2023-09-19 DIAGNOSIS — Z9221 Personal history of antineoplastic chemotherapy: Secondary | ICD-10-CM

## 2023-09-19 DIAGNOSIS — J3802 Paralysis of vocal cords and larynx, bilateral: Secondary | ICD-10-CM | POA: Diagnosis present

## 2023-09-19 DIAGNOSIS — A419 Sepsis, unspecified organism: Principal | ICD-10-CM | POA: Diagnosis present

## 2023-09-19 DIAGNOSIS — M549 Dorsalgia, unspecified: Secondary | ICD-10-CM | POA: Diagnosis present

## 2023-09-19 DIAGNOSIS — R1312 Dysphagia, oropharyngeal phase: Secondary | ICD-10-CM | POA: Diagnosis not present

## 2023-09-19 DIAGNOSIS — R7989 Other specified abnormal findings of blood chemistry: Secondary | ICD-10-CM | POA: Diagnosis present

## 2023-09-19 DIAGNOSIS — J441 Chronic obstructive pulmonary disease with (acute) exacerbation: Secondary | ICD-10-CM | POA: Diagnosis present

## 2023-09-19 DIAGNOSIS — I480 Paroxysmal atrial fibrillation: Secondary | ICD-10-CM | POA: Diagnosis present

## 2023-09-19 DIAGNOSIS — Z880 Allergy status to penicillin: Secondary | ICD-10-CM

## 2023-09-19 DIAGNOSIS — Z9981 Dependence on supplemental oxygen: Secondary | ICD-10-CM

## 2023-09-19 DIAGNOSIS — G510 Bell's palsy: Secondary | ICD-10-CM | POA: Diagnosis present

## 2023-09-19 DIAGNOSIS — E873 Alkalosis: Secondary | ICD-10-CM | POA: Diagnosis not present

## 2023-09-19 DIAGNOSIS — R1319 Other dysphagia: Secondary | ICD-10-CM | POA: Diagnosis not present

## 2023-09-19 DIAGNOSIS — R001 Bradycardia, unspecified: Secondary | ICD-10-CM | POA: Diagnosis not present

## 2023-09-19 DIAGNOSIS — Z66 Do not resuscitate: Secondary | ICD-10-CM | POA: Diagnosis not present

## 2023-09-19 DIAGNOSIS — J3800 Paralysis of vocal cords and larynx, unspecified: Secondary | ICD-10-CM | POA: Diagnosis not present

## 2023-09-19 DIAGNOSIS — G935 Compression of brain: Secondary | ICD-10-CM | POA: Diagnosis present

## 2023-09-19 DIAGNOSIS — Z7189 Other specified counseling: Secondary | ICD-10-CM | POA: Diagnosis not present

## 2023-09-19 DIAGNOSIS — C3490 Malignant neoplasm of unspecified part of unspecified bronchus or lung: Secondary | ICD-10-CM | POA: Diagnosis present

## 2023-09-19 DIAGNOSIS — I493 Ventricular premature depolarization: Secondary | ICD-10-CM | POA: Diagnosis present

## 2023-09-19 DIAGNOSIS — I4891 Unspecified atrial fibrillation: Secondary | ICD-10-CM | POA: Diagnosis not present

## 2023-09-19 DIAGNOSIS — Z8249 Family history of ischemic heart disease and other diseases of the circulatory system: Secondary | ICD-10-CM

## 2023-09-19 DIAGNOSIS — Z923 Personal history of irradiation: Secondary | ICD-10-CM

## 2023-09-19 DIAGNOSIS — R54 Age-related physical debility: Secondary | ICD-10-CM | POA: Diagnosis present

## 2023-09-19 DIAGNOSIS — R6520 Severe sepsis without septic shock: Secondary | ICD-10-CM | POA: Diagnosis present

## 2023-09-19 DIAGNOSIS — J90 Pleural effusion, not elsewhere classified: Secondary | ICD-10-CM | POA: Diagnosis present

## 2023-09-19 DIAGNOSIS — D496 Neoplasm of unspecified behavior of brain: Secondary | ICD-10-CM | POA: Diagnosis not present

## 2023-09-19 DIAGNOSIS — R112 Nausea with vomiting, unspecified: Secondary | ICD-10-CM | POA: Diagnosis not present

## 2023-09-19 DIAGNOSIS — R64 Cachexia: Secondary | ICD-10-CM | POA: Diagnosis present

## 2023-09-19 DIAGNOSIS — D6869 Other thrombophilia: Secondary | ICD-10-CM | POA: Diagnosis present

## 2023-09-19 DIAGNOSIS — C7931 Secondary malignant neoplasm of brain: Secondary | ICD-10-CM | POA: Diagnosis present

## 2023-09-19 DIAGNOSIS — G523 Disorders of hypoglossal nerve: Secondary | ICD-10-CM | POA: Diagnosis present

## 2023-09-19 DIAGNOSIS — Z5320 Procedure and treatment not carried out because of patient's decision for unspecified reasons: Secondary | ICD-10-CM | POA: Diagnosis present

## 2023-09-19 DIAGNOSIS — T447X5A Adverse effect of beta-adrenoreceptor antagonists, initial encounter: Secondary | ICD-10-CM | POA: Diagnosis not present

## 2023-09-19 DIAGNOSIS — J9622 Acute and chronic respiratory failure with hypercapnia: Secondary | ICD-10-CM | POA: Diagnosis present

## 2023-09-19 DIAGNOSIS — Z79899 Other long term (current) drug therapy: Secondary | ICD-10-CM

## 2023-09-19 DIAGNOSIS — E871 Hypo-osmolality and hyponatremia: Secondary | ICD-10-CM | POA: Diagnosis present

## 2023-09-19 DIAGNOSIS — E8809 Other disorders of plasma-protein metabolism, not elsewhere classified: Secondary | ICD-10-CM | POA: Diagnosis present

## 2023-09-19 DIAGNOSIS — C3491 Malignant neoplasm of unspecified part of right bronchus or lung: Secondary | ICD-10-CM | POA: Diagnosis present

## 2023-09-19 DIAGNOSIS — R1311 Dysphagia, oral phase: Secondary | ICD-10-CM | POA: Diagnosis present

## 2023-09-19 DIAGNOSIS — F05 Delirium due to known physiological condition: Secondary | ICD-10-CM | POA: Diagnosis not present

## 2023-09-19 DIAGNOSIS — Z515 Encounter for palliative care: Secondary | ICD-10-CM | POA: Diagnosis not present

## 2023-09-19 DIAGNOSIS — M795 Residual foreign body in soft tissue: Secondary | ICD-10-CM

## 2023-09-19 DIAGNOSIS — Z888 Allergy status to other drugs, medicaments and biological substances status: Secondary | ICD-10-CM

## 2023-09-19 DIAGNOSIS — E43 Unspecified severe protein-calorie malnutrition: Secondary | ICD-10-CM | POA: Diagnosis present

## 2023-09-19 DIAGNOSIS — Z8709 Personal history of other diseases of the respiratory system: Secondary | ICD-10-CM | POA: Diagnosis not present

## 2023-09-19 DIAGNOSIS — Z87891 Personal history of nicotine dependence: Secondary | ICD-10-CM

## 2023-09-19 DIAGNOSIS — R197 Diarrhea, unspecified: Secondary | ICD-10-CM | POA: Diagnosis not present

## 2023-09-19 DIAGNOSIS — I252 Old myocardial infarction: Secondary | ICD-10-CM

## 2023-09-19 HISTORY — DX: Type 2 diabetes mellitus without complications: E11.9

## 2023-09-19 LAB — COMPREHENSIVE METABOLIC PANEL WITH GFR
ALT: 27 U/L (ref 0–44)
AST: 48 U/L — ABNORMAL HIGH (ref 15–41)
Albumin: 2.3 g/dL — ABNORMAL LOW (ref 3.5–5.0)
Alkaline Phosphatase: 79 U/L (ref 38–126)
Anion gap: 13 (ref 5–15)
BUN: 26 mg/dL — ABNORMAL HIGH (ref 8–23)
CO2: 29 mmol/L (ref 22–32)
Calcium: 8.8 mg/dL — ABNORMAL LOW (ref 8.9–10.3)
Chloride: 102 mmol/L (ref 98–111)
Creatinine, Ser: 0.96 mg/dL (ref 0.44–1.00)
GFR, Estimated: >60 mL/min (ref >60)
Glucose, Bld: 116 mg/dL — ABNORMAL HIGH (ref 70–99)
Potassium: 5.4 mmol/L — ABNORMAL HIGH (ref 3.5–5.1)
Sodium: 144 mmol/L (ref 135–145)
Total Bilirubin: 1.9 mg/dL — ABNORMAL HIGH (ref 0.0–1.2)
Total Protein: 6.3 g/dL — ABNORMAL LOW (ref 6.5–8.1)

## 2023-09-19 LAB — CBC WITH DIFFERENTIAL/PLATELET
Abs Immature Granulocytes: 0.08 10*3/uL — ABNORMAL HIGH (ref 0.00–0.07)
Basophils Absolute: 0 10*3/uL (ref 0.0–0.1)
Basophils Relative: 0 %
Eosinophils Absolute: 0 10*3/uL (ref 0.0–0.5)
Eosinophils Relative: 0 %
HCT: 42.8 % (ref 36.0–46.0)
Hemoglobin: 13 g/dL (ref 12.0–15.0)
Immature Granulocytes: 1 %
Lymphocytes Relative: 2 %
Lymphs Abs: 0.2 10*3/uL — ABNORMAL LOW (ref 0.7–4.0)
MCH: 30.2 pg (ref 26.0–34.0)
MCHC: 30.4 g/dL (ref 30.0–36.0)
MCV: 99.3 fL (ref 80.0–100.0)
Monocytes Absolute: 0.7 10*3/uL (ref 0.1–1.0)
Monocytes Relative: 6 %
Neutro Abs: 11.7 10*3/uL — ABNORMAL HIGH (ref 1.7–7.7)
Neutrophils Relative %: 91 %
Platelets: 179 10*3/uL (ref 150–400)
RBC: 4.31 MIL/uL (ref 3.87–5.11)
RDW: 13.2 % (ref 11.5–15.5)
WBC: 12.8 10*3/uL — ABNORMAL HIGH (ref 4.0–10.5)
nRBC: 0 % (ref 0.0–0.2)

## 2023-09-19 LAB — MAGNESIUM: Magnesium: 2.1 mg/dL (ref 1.7–2.4)

## 2023-09-19 LAB — TROPONIN I (HIGH SENSITIVITY)
Troponin I (High Sensitivity): 57 ng/L — ABNORMAL HIGH (ref <18)
Troponin I (High Sensitivity): 83 ng/L — ABNORMAL HIGH (ref <18)

## 2023-09-19 LAB — RAPID URINE DRUG SCREEN, HOSP PERFORMED
Amphetamines: NOT DETECTED
Barbiturates: NOT DETECTED
Benzodiazepines: NOT DETECTED
Cocaine: NOT DETECTED
Opiates: NOT DETECTED
Tetrahydrocannabinol: NOT DETECTED

## 2023-09-19 LAB — I-STAT CG4 LACTIC ACID, ED: Lactic Acid, Venous: 3.1 mmol/L (ref 0.5–1.9)

## 2023-09-19 LAB — BRAIN NATRIURETIC PEPTIDE: B Natriuretic Peptide: 123 pg/mL — ABNORMAL HIGH (ref 0.0–100.0)

## 2023-09-19 LAB — RESP PANEL BY RT-PCR (RSV, FLU A&B, COVID)  RVPGX2
Influenza A by PCR: NEGATIVE
Influenza B by PCR: NEGATIVE
Resp Syncytial Virus by PCR: NEGATIVE
SARS Coronavirus 2 by RT PCR: NEGATIVE

## 2023-09-19 LAB — PROCALCITONIN: Procalcitonin: 6.93 ng/mL

## 2023-09-19 LAB — TSH: TSH: 1.334 u[IU]/mL (ref 0.350–4.500)

## 2023-09-19 LAB — LACTIC ACID, PLASMA: Lactic Acid, Venous: 2 mmol/L (ref 0.5–1.9)

## 2023-09-19 MED ORDER — BUDESON-GLYCOPYRROL-FORMOTEROL 160-9-4.8 MCG/ACT IN AERO
2.0000 | INHALATION_SPRAY | Freq: Two times a day (BID) | RESPIRATORY_TRACT | Status: DC
  Administered 2023-09-22 – 2023-10-05 (×25): 2 via RESPIRATORY_TRACT
  Filled 2023-09-19 (×2): qty 5.9

## 2023-09-19 MED ORDER — SODIUM CHLORIDE 0.9 % IV SOLN
1.0000 g | INTRAVENOUS | Status: DC
  Administered 2023-09-19 – 2023-09-21 (×3): 1 g via INTRAVENOUS
  Filled 2023-09-19 (×3): qty 10

## 2023-09-19 MED ORDER — BENZONATATE 100 MG PO CAPS: 200.0000 mg | ORAL_CAPSULE | Freq: Three times a day (TID) | ORAL | Status: DC | PRN

## 2023-09-19 MED ORDER — SODIUM CHLORIDE 0.9 % IV BOLUS
500.0000 mL | Freq: Once | INTRAVENOUS | Status: AC
  Administered 2023-09-19: 500 mL via INTRAVENOUS

## 2023-09-19 MED ORDER — MELATONIN 3 MG PO TABS: 3.0000 mg | ORAL_TABLET | Freq: Every evening | ORAL | Status: DC | PRN

## 2023-09-19 MED ORDER — TIZANIDINE HCL 4 MG PO TABS: 2.0000 mg | ORAL_TABLET | Freq: Two times a day (BID) | ORAL | Status: DC | PRN

## 2023-09-19 MED ORDER — ACETAMINOPHEN 650 MG RE SUPP: 650.0000 mg | Freq: Four times a day (QID) | RECTAL | Status: DC | PRN

## 2023-09-19 MED ORDER — ONDANSETRON HCL 4 MG/2ML IJ SOLN
4.0000 mg | Freq: Four times a day (QID) | INTRAMUSCULAR | Status: DC | PRN
  Administered 2023-10-01: 4 mg via INTRAVENOUS
  Filled 2023-09-19: qty 2

## 2023-09-19 MED ORDER — METOPROLOL TARTRATE 5 MG/5ML IV SOLN: 5.0000 mg | INTRAVENOUS | Status: DC | PRN

## 2023-09-19 MED ORDER — AMIODARONE HCL IN DEXTROSE 360-4.14 MG/200ML-% IV SOLN
60.0000 mg/h | INTRAVENOUS | Status: DC
  Filled 2023-09-19: qty 200

## 2023-09-19 MED ORDER — AMIODARONE HCL IN DEXTROSE 360-4.14 MG/200ML-% IV SOLN: 30.0000 mg/h | INTRAVENOUS | Status: DC

## 2023-09-19 MED ORDER — LEVALBUTEROL HCL 1.25 MG/0.5ML IN NEBU
1.2500 mg | INHALATION_SOLUTION | RESPIRATORY_TRACT | Status: DC | PRN
  Filled 2023-09-19: qty 0.5

## 2023-09-19 MED ORDER — LEVOFLOXACIN IN D5W 750 MG/150ML IV SOLN
750.0000 mg | Freq: Once | INTRAVENOUS | Status: AC
  Administered 2023-09-19: 750 mg via INTRAVENOUS
  Filled 2023-09-19: qty 150

## 2023-09-19 MED ORDER — LACTATED RINGERS IV SOLN: INTRAVENOUS | Status: AC

## 2023-09-19 MED ORDER — GUAIFENESIN ER 600 MG PO TB12
600.0000 mg | ORAL_TABLET | Freq: Two times a day (BID) | ORAL | Status: DC
  Filled 2023-09-19: qty 1

## 2023-09-19 MED ORDER — AMIODARONE LOAD VIA INFUSION
150.0000 mg | Freq: Once | INTRAVENOUS | Status: DC
  Filled 2023-09-19: qty 83.34

## 2023-09-19 MED ORDER — GUAIFENESIN 100 MG/5ML PO LIQD
20.0000 mL | Freq: Three times a day (TID) | ORAL | Status: DC
  Administered 2023-09-19 – 2023-09-22 (×5): 20 mL via ORAL
  Filled 2023-09-19 (×6): qty 20

## 2023-09-19 MED ORDER — ACETAMINOPHEN 325 MG PO TABS
650.0000 mg | ORAL_TABLET | Freq: Four times a day (QID) | ORAL | Status: DC | PRN
  Filled 2023-09-19: qty 2

## 2023-09-19 MED ORDER — SODIUM CHLORIDE 0.9 % IV SOLN
500.0000 mg | INTRAVENOUS | Status: DC
  Administered 2023-09-20 – 2023-09-21 (×2): 500 mg via INTRAVENOUS
  Filled 2023-09-19 (×2): qty 5

## 2023-09-19 NOTE — ED Notes (Addendum)
 Xray at [facility]. Pt alert, NAD, calm, interactive, resps e/u, speaking softly, clearly. Congested cough noted.

## 2023-09-19 NOTE — H&P (Signed)
 History and Physical      Elizabeth Mayer ZOX:[id number] DOB: 06/02/1960 DOA: 09/19/2023; DOS: 09/19/2023  PCP: Elizabeth Epley, FNP  Patient coming from: home   I have personally briefly reviewed patient's old medical records in [facility]  Chief Complaint: Shortness of breath  HPI: Elizabeth Mayer is a 63 y.o. female with medical history significant for COPD, paroxysmal atrial fibrillation not on chronic anticoagulation, chronic hypoxic respiratory failure on 2 to 3 L continuous nasal cannula, non-small cell lung cancer with history of metastatic disease to the brain, who is admitted to [facility] on 09/19/2023 with severe sepsis due to community-acquired pneumonia after presenting from home to [facility] ED complaining of shortness of breath.   The patient reports 2 to 3 days of progressive shortness of breath associated with new onset productive cough as well as subjective fever.  Denies any associated mopped assist, nor any recent chest pain, palpitations, diaphoresis, nausea, vomiting, dizziness, recently, or syncope.  No recent neck stiffness, abdominal pain, dysuria or gross hematuria.  She has a history of COPD complicated by chronic hypoxic respiratory failure on 2-3 continuous liters nasal cannula as an outpatient.  This is in the setting of being a former smoker, and reports good compliance with her outpatient breast tree.  In the context of her progressive shortness of breath over the last few days, she notes increased frequency of use of her prn albuterol  inhaler at home.  No recent wheezing.  She also has a history of paroxysmal atrial fibrillation, with the patient reporting a single prior episode of atrial fibrillation that occurred in the postoperative setting.  Given her report of a one-time postoperative recurrence of her atrial fibrillation, she has not been subsequently on anticoagulation.  Not on a scheduled AV nodal blocking agents at home.  Denies any known history of  underlying heart failure.  Per dystrophia, no prior echocardiogram on file.  In the setting of the above symptoms, patient patient contacted EMS earlier today, who noted the patient to be in atrial fibrillation with RVR, with heart rates in the 180s, corresponding with hypotension.  EMS was reported to have administered duo nebulizer treatments x 2, and brought the patient to [facility] ED for further evaluation and management thereof.    ED Course:  Vital signs in the ED were notable for the following: Upon initial arrival in the emergency department, initial heart rates were in the 160s to 190s, consistent with atrial fibrillation, before spontaneously cardioverted to sinus rhythm, with ensuing heart rates in the 90s to low 100s; she has remained afebrile; when initially in atrial fibrillation with RVR, her systolic blood pressures were noted to be in the 60s to 80s, with ensuing improvement in systolic blood pressures in the 90s to 120s following aforementioned spontaneous cardioversion; respiratory rate 17-25, initial oxygen  saturation 75% on room air, [place] improving into the mid 90s on 4 L nasal cannula.  Labs were notable for the following: CMP was notable for the following: Sodium 144, potassium 5.4, but with the caveat that this was reported to be hemolyzed sample, bicarbonate 29, creatinine 0.96 relative to most recent prior creatinine to 0.0 0.1 in April 2025 and BUN to current ratio greater than 20, calcium  adjusted for mild hypoalbuminemia noted to be 10.1, avidin 2.3, alkaline phosphatase 79, total bilirubin 1.9, AST 48, and ALT 27.  Initial lactic acid 2.0, with repeat lactate currently pending.  CBC notable for Locasol count 12,800 with 91% neutrophils, hemoglobin 13.  Urinary drug screen is pan negative.  BNP 123, compared to most recent prior 576 in January 2022.  High-sensitivity troponin I was initially 57, repeat value trending up to 83, relative demonstration prior high-sensitivity  troponin I value of 8 in January 2022.  Multiple RNs attempted on multiple occasions to collect blood cultures x 2, or unsuccessful.  COVID, influenza, RSV PCR were all negative.  Per my interpretation, EKG in ED demonstrated the following: Initial EKG showed atrial fibrillation with RVR, heart rate 191 with single PVC, and no evidence of T wave changes nor any evidence of ST changes, including no evidence of ST elevation.  Imaging in the ED, per corresponding formal radiology read, was notable for the following: 1 view chest x-ray, in comparison to the most recent prior chest x-ray from 05/19/2022 showed interval development of right basilar opacity suggestive of pneumonia, along with a right pleural effusion, will demonstrating no evidence of pulmonary edema nor any evidence of pneumothorax.  While in the ED, the following were administered: In the setting of initial report of penicillin allergy, EDP started the patient on Levaquin .  Normal saline x 500 cc bolus followed by lactated Ringer's at 150 cc/h.  Subsequently, the patient was admitted for further evaluation and management of presenting severe sepsis due to suspected community-acquired pneumonia complicated by atrial fibrillation with RVR, mildly elevated troponin, as well as acute on chronic hypoxic respiratory failure.    Review of Systems: As per HPI otherwise 10 point review of systems negative.   Past Medical History:  Diagnosis Date   Anemia    Angina 1982   related to stress   Asthma    in the past,  no current problems   Atrial fibrillation (HCC)    Complication of anesthesia    states anesthesia made her hair fall out, old meds. hard to awaken 1 time she took Flexeril  before.   Constipation    COPD (chronic obstructive pulmonary disease) (HCC)    Dyspnea    on oxygen  at home - 3L via [place]   Dysrhythmia    Fall 06/01/2023   Fatigue    GERD (gastroesophageal reflux disease)    patient denies this dx   Heart murmur 1970s    no problems currently   Hypertension    Ingrown toenail    Lung cancer (HCC) 10/2019   metastatic disease to the brain   On home oxygen  therapy    2L via [place] - 24 hours a day   Past heart attack 1980-1981   pt states she passed out and woke up in hospital- told she had heart attack, but then dr said he couldn't find anything wrong.   Peripheral vascular disease (HCC)    Pneumonia    x 1    Past Surgical History:  Procedure Laterality Date   CERVICAL DISC SURGERY  2000   Disc removed from neck    ingrown toe nail surgery Bilateral    IR IMAGING GUIDED PORT INSERTION  02/16/2020   MULTIPLE TOOTH EXTRACTIONS     for braces   RADIOLOGY WITH ANESTHESIA N/A 12/05/2019   Procedure: MRI BRAIN WITH AND WITHOUT CONTRAST;  Surgeon: Radiologist, Medication, MD;  Location: [facility] OR;  Service: Radiology;  Laterality: N/A;   RADIOLOGY WITH ANESTHESIA N/A 01/02/2020   Procedure: MRI BRAIN WITH AND WITHOUT CONTRAST;  Surgeon: Radiologist, Medication, MD;  Location: [facility] OR;  Service: Radiology;  Laterality: N/A;   RADIOLOGY WITH ANESTHESIA N/A 02/20/2020   Procedure: MRI WITH  ANESTHESIA OF BRAIN WITH AND WITHOUT CONTRAST;  Surgeon: Radiologist, Medication, MD;  Location: [facility] OR;  Service: Radiology;  Laterality: N/A;   RADIOLOGY WITH ANESTHESIA N/A 06/11/2020   Procedure: MRI WITH ANESTHESIA OF BRAIN WITH AND WITHOUT CONTRAST;  Surgeon: Radiologist, Medication, MD;  Location: [facility] OR;  Service: Radiology;  Laterality: N/A;   RADIOLOGY WITH ANESTHESIA N/A 10/10/2020   Procedure: MRI WITH ANESTHESIA BRAIN WITH AND WITHOUT CONTRAST;  Surgeon: Radiologist, Medication, MD;  Location: [facility] OR;  Service: Radiology;  Laterality: N/A;   RADIOLOGY WITH ANESTHESIA N/A 01/30/2021   Procedure: MRI BRAIN WITH AND WITHOUT CONTRASTWITH ANESTHESIA;  Surgeon: Radiologist, Medication, MD;  Location: [facility] OR;  Service: Radiology;  Laterality: N/A;   RADIOLOGY WITH ANESTHESIA N/A 06/12/2021   Procedure: MRI WITH ANESTHESIA OF BRAIN WITH  AND WITHOUT CONTRAST;  Surgeon: Radiologist, Medication, MD;  Location: [facility];  Service: Radiology;  Laterality: N/A;   RADIOLOGY WITH ANESTHESIA N/A 09/11/2021   Procedure: MRI OF BRAIN WITH AND WITHOUT CONTRAST WITH ANESTHESIA;  Surgeon: Radiologist, Medication, MD;  Location: [facility];  Service: Radiology;  Laterality: N/A;   RADIOLOGY WITH ANESTHESIA N/A 01/15/2022   Procedure: MRI BRAIN WITH AND WITHOUT CONTRAST WITH ANESTHESIA;  Surgeon: Radiologist, Medication, MD;  Location: [facility];  Service: Radiology;  Laterality: N/A;   RADIOLOGY WITH ANESTHESIA N/A 05/14/2022   Procedure: MRI WITH ANESTHESIA BRAIN WITH AND WITHOUT CONTRAST;  Surgeon: Radiologist, Medication, MD;  Location: [facility] OR;  Service: Radiology;  Laterality: N/A;   TONSILLECTOMY      Social History:  reports that she quit smoking about 24 years ago. Her smoking use included cigarettes. She started smoking about 45 years ago. She has a 21 pack-year smoking history. She has never used smokeless tobacco. She reports that she does not drink alcohol and does not use drugs.   Allergies  Allergen Reactions   Sudafed [Pseudoephedrine] Hypertension   Amlodipine  Swelling   Atorvastatin  Other (See Comments)    Cramping   Gabapentin Other (See Comments)    Raise blood pressure and red rings around eyes Blood vessels popped in her eyes   Mobic  [Meloxicam ] Swelling    Inflamed the area that has inflammation and stabbing pains in the area   Penicillins Hives     Childhood reaction   Prednisone      Caused A-fib    Family History  Problem Relation Age of Onset   Arthritis Mother    Heart failure Father     Family history reviewed and not pertinent    Prior to Admission medications   Medication Sig Start Date End Date Taking? Authorizing Provider  acetaminophen  (TYLENOL ) 500 MG tablet Take 1,000 mg by mouth every 6 (six) hours as needed for moderate pain.    [provider]  ALPRAZolam  (XANAX ) 1 MG tablet Take 1 tablet (1  mg total) by mouth once as needed for up to 1 dose (mri claustrophobia). Take 45 minutes before MRI study 07/28/22   Vaslow, Zachary K, MD  azelastine (ASTELIN) 0.1 % nasal spray Place 2 sprays into both nostrils 2 (two) times daily. Use in each nostril as directed    [provider]  benzoyl peroxide (CERAVE ACNE FOAMING CREAM) 4 % external liquid Apply 1 application topically 2 (two) times daily. Face    [provider]  Blood Pressure Monitoring (BLOOD PRESSURE MONITOR AUTOMAT) DEVI Check Blood pressure at home daily 06/13/21   Thayil, Irene T, PA-C  BREYNA  160-4.5 MCG/ACT inhaler Inhale 2 puffs into  the lungs 2 (two) times daily. 07/11/23   [provider]  budeson-glycopyrrolate-formoterol  (BREZTRI  AEROSPHERE) 160-9-4.8 MCG/ACT AERO Inhale 2 puffs into the lungs in the morning and at bedtime. 07/01/23   Denson Flake, MD  budeson-glycopyrrolate-formoterol  (BREZTRI  AEROSPHERE) 160-9-4.8 MCG/ACT AERO 2 samples provided 07/01/23   Byrum, Robert S, MD  camphor-menthol [facility]) lotion Apply 1 Application topically as needed for itching.    [provider]  cetaphil (CETAPHIL) lotion Apply 1 application  topically 2 (two) times daily.    [provider]  clindamycin  (CLEOCIN  T) 1 % external solution Apply 1 application topically 2 (two) times daily. 09/20/20   [provider]  diltiazem  (CARDIZEM ) 30 MG tablet Take 1 tablet (30 mg total) by mouth 4 (four) times daily as needed (afib episodes). 02/12/22   Asa Lauth, NP  fexofenadine (ALLEGRA) 180 MG tablet Take 180 mg by mouth in the morning.    [provider]  folic acid  (FOLVITE ) 1 MG tablet Take 1 tablet (1 mg total) by mouth daily. Patient not taking: Reported on 07/01/2023 07/25/21   Ander Bame, MD  HYDROcodone -acetaminophen  (NORCO/VICODIN) 5-325 MG tablet Take 1 tablet by mouth every 6 (six) hours as needed for moderate pain. Patient not taking: Reported on 07/01/2023 02/07/21    Rogerio Clay IV, MD  ipratropium (ATROVENT) 0.03 % nasal spray Place 2 sprays into both nostrils 3 (three) times daily.    [provider]  lidocaine -prilocaine  (EMLA ) cream Apply 1 application topically as needed. Patient not taking: Reported on 07/01/2023 01/25/20   Heilingoetter, Cassandra L, PA-C  magic mouthwash (lidocaine , diphenhydrAMINE, alum & mag hydroxide) suspension Swish and swallow 5 mLs 3 (three) times daily. 07/28/23   Elizabeth Epley, FNP  magic mouthwash SOLN Take 5 mLs by mouth 3 (three) times daily. Suspension contains equal amounts of Maalox Extra Strength, nystatin, and diphenhydramine. 07/26/23   Moore, Janece, FNP  Menthol, Topical Analgesic, (ICY HOT EX) Apply 1 application  topically daily as needed (feet pain).    [provider]  Multiple Minerals-Vitamins (CAL MAG ZINC +D3) TABS Take 3 capsules by mouth daily.    [provider]  Multiple Vitamins-Minerals (ALIVE MULTI-VITAMIN PO) Take 1 tablet by mouth daily.    [provider]  ondansetron  (ZOFRAN ) 8 MG tablet TAKE 1 TABLET BY MOUTH EVERY 8 HOURS AS NEEDED FOR NAUSEA OR VOMITING 10/09/21   Dorsey, John T IV, MD  tiZANidine (ZANAFLEX) 2 MG tablet Take 1 tablet by mouth every 12 (twelve) hours. 06/29/23   [provider]  triamcinolone ointment (KENALOG) 0.1 % Apply 1 application  topically at bedtime. 05/14/20   [provider]  VENTOLIN  HFA 108 (90 Base) MCG/ACT inhaler INHALE 1 TO 2 PUFFS BY MOUTH EVERY 6 HOURS AS NEEDED FOR WHEEZING FOR SHORTNESS OF BREATH 09/16/22   Denson Flake, MD     Objective    Physical Exam: Vitals:   09/19/23 1745 09/19/23 1800 09/19/23 1815 09/19/23 1830  BP:      Resp: 17 19 20  (!) 21  Temp:      TempSrc:      Weight:      Height:        General: appears to be stated age; alert, oriented, mildly increased work of breathing noted Skin: warm, dry, no rash Head:  AT/[place] Mouth:  Oral mucosa membranes appear moist, normal  dentition Neck: supple; trachea midline Chest: port-A-cath noted. Heart:  RRR; did not appreciate any M/R/G Lungs:  CTAB, did not appreciate any wheezes, rales, or rhonchi Abdomen: + BS; soft, ND, NT Vascular: 2+ pedal pulses b/l; 2+ radial pulses b/l Extremities: no peripheral edema, no muscle wasting Neuro: strength and sensation intact in upper and lower extremities b/l    Labs on Admission: I have personally reviewed following labs and imaging studies  CBC: Recent Labs  Lab 09/19/23 1739  WBC 12.8*  NEUTROABS 11.7*  HGB 13.0  HCT 42.8  MCV 99.3  PLT 179   Basic Metabolic Panel: Recent Labs  Lab 09/19/23 1739  NA 144  K 5.4*  CL 102  CO2 29  GLUCOSE 116*  BUN 26*  CREATININE 0.96  CALCIUM  8.8*  MG 2.1   GFR: Estimated Creatinine Clearance: 40.3 mL/min (by C-G formula based on SCr of 0.96 mg/dL). Liver Function Tests: Recent Labs  Lab 09/19/23 1739  AST 48*  ALT 27  ALKPHOS 79  BILITOT 1.9*  PROT 6.3*  ALBUMIN 2.3*   No results for input(s): LIPASE, AMYLASE in the last 168 hours. No results for input(s): AMMONIA in the last 168 hours. Coagulation Profile: No results for input(s): INR, PROTIME in the last 168 hours. Cardiac Enzymes: No results for input(s): CKTOTAL, CKMB, CKMBINDEX, TROPONINI in the last 168 hours. BNP (last 3 results) No results for input(s): PROBNP in the last 8760 hours. HbA1C: No results for input(s): HGBA1C in the last 72 hours. CBG: No results for input(s): GLUCAP in the last 168 hours. Lipid Profile: No results for input(s): CHOL, HDL, LDLCALC, TRIG, CHOLHDL, LDLDIRECT in the last 72 hours. Thyroid  Function Tests: No results for input(s): TSH, T4TOTAL, FREET4, T3FREE, THYROIDAB in the last 72 hours. Anemia Panel: No results for input(s): VITAMINB12, FOLATE, FERRITIN, TIBC, IRON, RETICCTPCT in the last 72 hours. Urine analysis:    Component Value Date/Time    COLORURINE STRAW (A) 09/04/2020 1147   APPEARANCEUR CLEAR 09/04/2020 1147   LABSPEC 1.005 09/04/2020 1147   PHURINE 6.0 09/04/2020 1147   GLUCOSEU NEGATIVE 09/04/2020 1147   HGBUR NEGATIVE 09/04/2020 1147   HGBUR negative 10/31/2007 1057   BILIRUBINUR negative 02/15/2023 1427   BILIRUBINUR negative 02/10/2022 1623   KETONESUR negative 02/15/2023 1427   KETONESUR NEGATIVE 09/04/2020 1147   PROTEINUR Negative 02/10/2022 1623   PROTEINUR NEGATIVE 09/04/2020 1147   UROBILINOGEN 0.2 02/15/2023 1427   UROBILINOGEN 0.2 10/31/2007 1057   NITRITE Negative 02/15/2023 1427   NITRITE negative 02/10/2022 1623   NITRITE NEGATIVE 09/04/2020 1147   LEUKOCYTESUR Negative 02/15/2023 1427   LEUKOCYTESUR NEGATIVE 09/04/2020 1147    Radiological Exams on Admission: DG Chest Portable 1 View Result Date: 09/19/2023 CLINICAL DATA:  Short of breath, history of metastatic non-small cell lung cancer EXAM: PORTABLE CHEST 1 VIEW COMPARISON:  03/25/2021, 05/19/2022 FINDINGS: Single frontal view of the chest demonstrates right chest wall port tip overlying atriocaval junction. External defibrillator pads overlie the chest. Cardiac silhouette is unremarkable. There is prominence of the right hilum again noted, with new right basilar consolidation and right pleural effusion. Chronic elevation of the right hemidiaphragm. No pneumothorax. Left chest is clear. No acute bony abnormalities. IMPRESSION: 1. Right basilar consolidation and right pleural effusion, new since prior studies. I would favor acute pneumonia or aspiration, though close follow-up after treatment is recommended to exclude recurrent malignancy. 2. Stable right hilar prominence, consistent with post therapeutic changes and bronchiectasis seen on prior CT. Electronically Signed   By: Bobbye Burrow M.D.   On: 09/19/2023 17:31      Assessment/Plan   Principal  Problem:   CAP (community acquired pneumonia) Active Problems:   Non-small cell lung cancer  (HCC)   Paroxysmal atrial fibrillation with RVR (HCC)   Severe sepsis (HCC)   Leukocytosis   SOB (shortness of breath)   Elevated troponin   Acute on chronic hypoxic respiratory failure (HCC)   History of COPD     #) Severe sepsis due to community-acquired pneumonia: Diagnosis on the basis of 2 3 days of progressive shortness of breath associate with new onset productive cough, subjective fever, with chest x-ray showing interval development of right basilar opacity concerning for pneumonia.   SIRS criteria met via leukocytosis with neutrophilic predominance, tachycardia, tachypnea. Lactic acid level: Mildly elevated at 2.0, with repeat value currently pending.. Of note, given the associated presence of suspected end organ damage in the form of concominant presenting elevated lactic acid level as well as acute on chronic hypoxic respiratory failure and additional evidence of endorgan damage in the form of mildly elevated troponin,, criteria are met for pt's sepsis to be considered severe in nature. However, in the absence of lactic acid level that is greater than or equal to 4.0, and in the absence of any associated hypotension refractory to IVF's, there are no indications for administration of a 30 mL/kg IVF bolus at this time.   Additional ED work-up/management notable for: Multiple RNs attempted on multiple occasions to collect blood cultures x 2, but were unsuccessful.  The patient was initially started on Levaquin  by EDP in the setting of a history of penicillin allergy.  Per my ensuing discussions with her RPH, they recommend switching to azithromycin  and Rocephin.  Have subsequently changed antibiotics from Levaquin  to Rocephin plus azithromycin .  The only caveat to this is there is documentation of a history of bronchiectasis, predisposing the patient to development of Pseudomonas.  Will closely monitor her ensuing clinical trend, with potential for expansion of antibiotics to include  antipseudomonal coverage if clinically indicated.  No e/o additional infectious process at this time, including COVID, influenza, RSV PCR which were all negative.  Will also check urinalysis.  Of note, We will right lower lobe consolidation on chest x-ray is suspicious for pneumonia, will further evaluate with CTA chest, which has been chosen relative to CT chest with contrast for the added benefit of evaluation of acute pulmonary embolism even her elevated troponin as well as increased risk for thromboembolic process as consequence of her underlying latency.   Plan: CBC w/ diff and CMP in AM. blood cx's x 2 have been ordered, as further detailed above. Abx: Start Rocephin and azithromycin , as above.  Continuous lactated Ringer's at 150 cc/h, as ordered by EDP.  Stat repeat lactic acid level.  Add on procalcitonin level.  Check strep pneumoniae urine antigen.  Flutter valve, incentive spirometry.  Scheduled Mucinex.  Prn Tessalon Perles.  Check urinalysis.  CTA chest, as above.  As needed acetaminophen  for fever.  Monitor on symmetry.  Prn Xopenex.                     #) Atrial fibrillation with RVR: In the setting of a known h/o of paroxysmal atrial fibrillation, with the patient reporting a single prior episode of atrial fibrillation that occurred several years ago in the postoperative setting, the patient presents to the ED today in atrial fibrillation with RVR, with initial heart rates in the 160s to 190s, corresponding with hypotension, with initial systolic blood pressures in the 60s to 80s.  She spontaneously converted  to sinus rhythm, and a subsequent remained in sinus rhythm, with associated improvement in blood pressure, as further quantified above.  There are multiple factors that may have contributed to today's episode of atrial fibrillation with RVR, including physiologic stress stemming from her presenting severe sepsis due to suspected community-acquired pneumonia, as  well as recent increase in use of home prn albuterol  inhaler as a consequence of her presenting 2 to 3 days of progressive shortness of breath.  No clinical or radiographic evidence of contributory or resultant acutely compensated heart failure at this time, but will pursue updated cardiogram in the morning, as there is no prior echocardiogram results on file, per initial chart review.  ACS felt to be less likely in the absence of any recent CP, while presenting EKG shows no evidence of acute ischemic changes.  She has a mildly elevated initial troponin, which appears to be more consistent with type II supply/demand mismatch as a consequence of her initial atrial fibrillation with RVR as well as as a consequence of presenting diminished oxygen  delivery capacity as a consequence of presenting acute on chronic hypoxic respiratory failure.  However, will continue to trend troponin, pursue echocardiogram in the morning.  In the setting of CHA2DS2-VASc score of  1 for gender, criteria do not appear to be met for formal anticoagulation, rather consideration for aspirin.  However, when she turns 63 years old, CHADS2 Vasc score will change to 2, at which time she would meet quantitative threshold for initiation of formal anticoagulation for thromboembolic prophylaxis.  He is not on any scheduled AV nodal blocking medications at home. Will start low-dose metoprolol to tartrate, as further quantified below.  Plan: Monitor strict I's & O's and daily weights. Monitor on telemetry.  Repeat serum magnesium level in the morning.  Repeat CMP/CBC in the AM.  Metoprolol tartrate 12.5 mg p.o. twice daily, as above.  Refrain from formal anticoagulation in the setting of absence of criteria for such, as above.  Repeat troponin in the morning.  Echocardiogram in the morning.  Check TSH.  Further evaluation management of presenting severe sepsis due to community-acquired pneumonia.  Will pursue Xopenex as preferred prn beta-2  agonist for now.  Check urinalysis.  Prn IV Lopressor for sustained heart rates greater than 130.  Check EKG now to document spontaneous cardioversion to sinus rhythm.                       #) Elevated troponin: mildly elevated initial troponin of 57, with repeat trending up slightly to 83, relative to most recent prior high-sensitivity troponin I data point of 8 when checked in January 2022.  Suspect that this mildly elevated troponin is on the basis of supply demand mismatch from multifactorial contributing factors, including problem type II supply/demand mismatch as a consequence of her initial atrial fibrillation with RVR, with initial heart rates in the 190s, resulting in significant decline in diastolic coronary filling time as well as suspected additional contribution from diminished oxygen  delivery capacity in the context of acute on chronic hypoxic respiratory failure.  Type I process due to acute plaque rupture appears less likely at this time, in the absence of any recent chest pain or any evidence of overt acute ischemic changes on presenting EKG, including no evidence of STEMI.  Differential also includes increased risk for acute pulmonary embolism given her recent shortness of breath and hypercoagulable state in the form of underlying malignancy.  As a consequence, will also pursue CTA  chest, which will also assist with further qualification of right lower lobe consolidation identified on presenting chest x-ray.  Plan: repeat troponin in the AM. Monitor on telemetry. PRN EKG for development of chest pain. PRN sublingual nitroglycerin for CP. Check serum Mg level and repeat CMP in the morning.  Repeat CBC in the AM. Additional evaluation and management of presenting atrial fibrillation with RVR as well as acute on chronic hypoxic respiratory failure as suspected driving force behind mildly elevated troponin, as above.  CTA chest, as above.  Echocardiogram in the morning.   Repeat EKG now to document spontaneous cardioversion to sinus rhythm.                     #) Pseudohyperkalemia: Presenting CMP conveyed potassium level 5.4, although this is suspected to represent a false elevation given concomitant report of a hemolyzed specimen.  Will repeat CMP, as further outlined below, to further evaluate.  No evidence of associated acute EKG changes.  Plan: Repeat CMP in the morning.  Monitor on telemetry.                        #) Acute on chronic hypoxic respiratory failure: in the context of acute respiratory symptoms and a documented history of chronic hypoxic respiratory failure for which she is on 2 to 3 L continuous nasal cannula at baseline, initial oxygen  saturation was noted to be 75%, which is subsequently improved into the low to mid 90s on 4 L nasal cannula.  This appears to be on the basis of her presenting severe sepsis due to community-acquired pneumonia, noting interval development of right lower lobe consolidation on presenting chest x-ray, concerning for pneumonia.  He has a history of COPD, although presentation does not appear to be associate with a full-blown acute exacerbation thereof and presentation has not been associate any wheezing.  Consequently, we will refrain from initiation of systemic corticosteroids at this time.   Aside from right basilar consolidation and small right lateral effusion, suspected to be parapneumonic in nature, presenting chest x-ray shows no evidence of acute cardiopulmonary process, Cleen has a pulmonary edema Adolfo Hooker evidence of pneumothorax.  No overt clinical or radiographic evidence to suggest acutely decompensated heart failure, but will closely monitor for ensuing development of such given her initial atrial fibrillation with RVR.  It is noted that her BNP, while not significantly elevated, is slightly higher than anticipated given her small body habitus.  Suspect that mildly elevated  troponin is on the basis of supply demand mismatch as further detailed above, as opposed to representing a type I process due to acute plaque rupture, particularly given presenting ekg that shows no e/o acute ischemic changes, including no evidence of STEMI, and the absence of any reported chest pain a/w this presentation. Overall, ACS is felt to be less likely relative to type 2 supply demand mismatch, as above, but will closely monitor on telemetry overnight while further trending troponin and pursuing echocardiogram in the morning.   She is also noted to be at increased risk for development of acute pulmonary embolism, particularly given her hypercoagulable state in the setting of underlying malignancy.  Consequently, will pursue CTA chest to evaluate for acute pulmonary embolism, which will also assist with further qualifying the right lower lobe consolidation observed on presenting chest x-ray.  Of note, COVID, influenza, RSV PCR are all negative.  Plan: further evaluation/management of presenting severe sepsis due to community-acquired pneumonia, as above.  monitor on telemetry. CMP/CBC in the AM. Check serum Mg and Phos levels. Check blood gas. Flutter valve, incentive spirometry.  Repeat troponin in the morning.  Echocardiogram in the morning.  Check CTA chest, as above.  Resume home scheduled Breztri .  Prn Xopenex nebulizer.  Add on procalcitonin level.  Repeat BNP in the morning.                    #) COPD: Documented history thereof, without clinical evidence of acute exacerbation at this time, although she is certainly at risk for Exa involvement of such given presenting severe sepsis due to suspected right lower lobe pneumonia outpatient respiratory regimen includes the following: Breztri  inhaler as well as as needed albuterol  inhaler.  In the context of her presenting atrial fibrillation with RVR, will pursue Xopenex as preferred short acting beta-2 agonist for now.  Plan:  cont outpatient history. Prn Xopenex nebulizer. Check CMP and serum magnesium level in the AM.  Check phosphorus level.  Follow-up for result of blood gas.                      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  She has subsequently undergone radiation, chemotherapy, and has previously followed with [name] of outpatient oncology, as recently as October 2023, although it appears that she may now follow with [facility] oncology.  Is noted to have Port-A-Cath.  Plan: Further evaluation and management per outpatient oncology.     DVT prophylaxis: SCD's   Code Status: Full code Family Communication: none Disposition Plan: Per Rounding Team Consults called: none;  Admission status: Inpatient     I SPENT GREATER THAN 75  MINUTES IN CLINICAL CARE TIME/MEDICAL DECISION-MAKING IN COMPLETING THIS ADMISSION.      Lavern Maslow B Caressa Scearce DO [facility]  From 7PM - 7AM   09/19/2023, 7:45 PM

## 2023-09-19 NOTE — ED Notes (Signed)
 Pt attempted to use bedpan to provide urine sample, attempt unsuccessful.

## 2023-09-19 NOTE — ED Notes (Signed)
 Unsuccessful with obtaining blood cultures.. notified.  KM

## 2023-09-19 NOTE — ED Provider Notes (Signed)
 Emergency Department Provider Note   I have reviewed the triage vital signs and the nursing notes.   HISTORY  Chief Complaint Shortness of Breath   HPI Elizabeth Mayer is a 63 y.o. female past history of COPD, paroxysmal A-fib, hypertension presents to the emergency department for evaluation of shortness of breath.  Symptoms been ongoing for the past 2 days.  She is been using her home nebulizer machine without relief.  EMS arrived to find the patient in distress and gave 2 DuoNebs.  She was in A-fib with RVR and had hypotension.  No additional medications given.  Patient denies any chest pain or fevers.  She notes a remote history of A-fib but does not take medication for this as it was thought to be due to her cancer treatment at the time and had not recurred. She is not anticoagulated.    Past Medical History:  Diagnosis Date   Anemia    Angina 1982   related to stress   Asthma    in the past,  no current problems   Atrial fibrillation (HCC)    Complication of anesthesia    states anesthesia made her hair fall out, old meds. hard to awaken 1 time she took Flexeril  before.   Constipation    COPD (chronic obstructive pulmonary disease) (HCC)    Dyspnea    on oxygen  at home - 3L via [place]   Dysrhythmia    Fall 06/01/2023   Fatigue    GERD (gastroesophageal reflux disease)    patient denies this dx   Heart murmur 1970s   no problems currently   Hypertension    Ingrown toenail    Lung cancer (HCC) 10/2019   metastatic disease to the brain   On home oxygen  therapy    2L via [place] - 24 hours a day   Past heart attack 1980-1981   pt states she passed out and woke up in hospital- told she had heart attack, but then dr said he couldn't find anything wrong.   Peripheral vascular disease (HCC)    Pneumonia    x 1    Review of Systems  Constitutional: No fever/chills Cardiovascular: Denies chest pain. Respiratory: Positive shortness of breath. Gastrointestinal: No abdominal  pain.  No nausea, no vomiting.   Neurological: Negative for headaches.  ____________________________________________   PHYSICAL EXAM:  VITAL SIGNS: Vitals:   09/19/23 1815 09/19/23 1830  BP:    Resp: 20 (!) 21  Temp:     Constitutional: Alert and oriented. Conversational but appears unwell.  Eyes: Conjunctivae are normal.  Head: Atraumatic. Nose: No congestion/rhinnorhea. Mouth/Throat: Mucous membranes are moist.  Neck: No stridor.   Cardiovascular: A fib RVR. Good peripheral circulation. Grossly normal heart sounds.   Respiratory: Increased respiratory effort.  No retractions. Lungs CTAB. Gastrointestinal: Soft and nontender. No distention.  Musculoskeletal:  No gross deformities of extremities. Neurologic:  Normal speech and language.  Skin:  Skin is warm, dry and intact. No rash noted.  ____________________________________________   LABS (all labs ordered are listed, but only abnormal results are displayed)  Labs Reviewed  COMPREHENSIVE METABOLIC PANEL WITH GFR - Abnormal; Notable for the following components:      Result Value   Potassium 5.4 (*)    Glucose, Bld 116 (*)    BUN 26 (*)    Calcium  8.8 (*)    Total Protein 6.3 (*)    Albumin 2.3 (*)    AST 48 (*)    Total  Bilirubin 1.9 (*)    All other components within normal limits  CBC WITH DIFFERENTIAL/PLATELET - Abnormal; Notable for the following components:   WBC 12.8 (*)    Neutro Abs 11.7 (*)    Lymphs Abs 0.2 (*)    Abs Immature Granulocytes 0.08 (*)    All other components within normal limits  BRAIN NATRIURETIC PEPTIDE - Abnormal; Notable for the following components:   B Natriuretic Peptide 123.0 (*)    All other components within normal limits  TROPONIN I (HIGH SENSITIVITY) - Abnormal; Notable for the following components:   Troponin I (High Sensitivity) 57 (*)    All other components within normal limits  RESP PANEL BY RT-PCR (RSV, FLU A&B, COVID)  RVPGX2  CULTURE, BLOOD (ROUTINE X 2)   CULTURE, BLOOD (ROUTINE X 2)  MAGNESIUM  RAPID URINE DRUG SCREEN, HOSP PERFORMED  LACTIC ACID, PLASMA  TROPONIN I (HIGH SENSITIVITY)   ____________________________________________  EKG   EKG Interpretation Date/Time:  Sunday September 19 2023 15:44:55 EDT Ventricular Rate:  191 PR Interval:    QRS Duration:  59 QT Interval:  269 QTC Calculation: 480 R Axis:   89  Text Interpretation: Atrial fibrillation with rapid V-rate Ventricular premature complex Borderline right axis deviation Probable LVH with secondary repol abnrm Confirmed by Abby Hocking [phone]) on 09/19/2023 4:25:06 PM        ____________________________________________  RADIOLOGY  DG Chest Portable 1 View Result Date: 09/19/2023 CLINICAL DATA:  Short of breath, history of metastatic non-small cell lung cancer EXAM: PORTABLE CHEST 1 VIEW COMPARISON:  03/25/2021, 05/19/2022 FINDINGS: Single frontal view of the chest demonstrates right chest wall port tip overlying atriocaval junction. External defibrillator pads overlie the chest. Cardiac silhouette is unremarkable. There is prominence of the right hilum again noted, with new right basilar consolidation and right pleural effusion. Chronic elevation of the right hemidiaphragm. No pneumothorax. Left chest is clear. No acute bony abnormalities. IMPRESSION: 1. Right basilar consolidation and right pleural effusion, new since prior studies. I would favor acute pneumonia or aspiration, though close follow-up after treatment is recommended to exclude recurrent malignancy. 2. Stable right hilar prominence, consistent with post therapeutic changes and bronchiectasis seen on prior CT. Electronically Signed   By: Bobbye Burrow M.D.   On: 09/19/2023 17:31    ____________________________________________   PROCEDURES  Procedure(s) performed:   Procedures  CRITICAL CARE Performed by: Roberts Ching Total critical care time: 45 minutes Critical care time was exclusive of separately  billable procedures and treating other patients. Critical care was necessary to treat or prevent imminent or life-threatening deterioration. Critical care was time spent personally by me on the following activities: development of treatment plan with patient and/or surrogate as well as nursing, discussions with consultants, evaluation of patient's response to treatment, examination of patient, obtaining history from patient or surrogate, ordering and performing treatments and interventions, ordering and review of laboratory studies, ordering and review of radiographic studies, pulse oximetry and re-evaluation of patient's condition.  Abby Hocking, MD Emergency Medicine  ____________________________________________   INITIAL IMPRESSION / ASSESSMENT AND PLAN / ED COURSE  Pertinent labs & imaging results that were available during my care of the patient were reviewed by me and considered in my medical decision making (see chart for details).   This patient is Presenting for Evaluation of SOB, which does require a range of treatment options, and is a complaint that involves a high risk of morbidity and mortality.  The Differential Diagnoses includes but is not exclusive  to acute coronary syndrome, aortic dissection, pulmonary embolism, cardiac tamponade, community-acquired pneumonia, pericarditis, musculoskeletal chest wall pain, etc.   Critical Interventions-    Medications  lactated ringers  infusion ( Intravenous New Bag/Given 09/19/23 1854)  levofloxacin  (LEVAQUIN ) IVPB 750 mg (has no administration in time range)  sodium chloride  0.9 % bolus 500 mL (500 mLs Intravenous New Bag/Given 09/19/23 1550)    Reassessment after intervention: Converted to NSR.    I did obtain Additional Historical Information from EMS.  I decided to review pertinent External Data, and in summary remote history of PAF thought to be related to concern treatment at the time. No anticoagulation.   Clinical  Laboratory Tests Ordered, included CBC with leukocytosis to 12.8. No AKI. Troponin 57 likely 2/2 demand in the setting of tachycardia.   Radiologic Tests Ordered, included CXR. I independently interpreted the images and agree with radiology interpretation.   Cardiac Monitor Tracing which shows A fib RVR   Social Determinants of Health Risk patient is not an active smoker.   Consult complete with [facility], Dr. Brock Canner. Plan for admit.   Medical Decision Making: Summary:  Patient arrives to the emergency department with shortness of breath and severe tachycardia.  Appears to be A-fib/RVR.  Soft blood pressures but patient is awake/alert.  Will try amiodarone.  Patient may ultimately require cardioversion but given mental status plan for meds initially.   04:05 PM  Patient spontaneously converted to normal sinus rhythm.  No amiodarone administered. Looking well. BP improved. Doubt sepsis. Suspect hypotension was related to A fib RVR.   Reevaluation with update and discussion with patient. Remains in NSR. PNA on CVA. She is a COPD patient with aspiration risk. Will broaden abx to Levaquin  and admit. She is in agreement.   Patient's presentation is most consistent with acute presentation with potential threat to life or bodily function.   Disposition: admit  ____________________________________________  FINAL CLINICAL IMPRESSION(S) / ED DIAGNOSES  Final diagnoses:  Community acquired pneumonia of right lower lobe of lung  Atrial fibrillation with RVR (HCC)  Hypotension, unspecified hypotension type    Note:  This document was prepared using Dragon voice recognition software and may include unintentional dictation errors.  Abby Hocking, MD, [facility] Emergency Medicine    Prescilla Monger, Shereen Dike, MD 09/19/23 2013

## 2023-09-19 NOTE — ED Notes (Signed)
 Multiple attempts made to obtain blood cultures, unsuccessful, provider notified and Dr Dolan Freiberg ordered to start Abx.

## 2023-09-19 NOTE — ED Notes (Signed)
 Pt given water and repositioned in bed.

## 2023-09-19 NOTE — ED Triage Notes (Signed)
 PT BIB [facility] from home after PT called neighbor complaining of SOB lasting 2 days. PT aox4. Received 2 duonebs and 100 ml of fluid en route.  [facility] VS:  104 systolic, HR 180-220, RR 32

## 2023-09-20 ENCOUNTER — Inpatient Hospital Stay (HOSPITAL_COMMUNITY)

## 2023-09-20 ENCOUNTER — Encounter (HOSPITAL_COMMUNITY): Payer: Self-pay

## 2023-09-20 DIAGNOSIS — I4891 Unspecified atrial fibrillation: Secondary | ICD-10-CM

## 2023-09-20 LAB — CBC WITH DIFFERENTIAL/PLATELET
Abs Immature Granulocytes: 0.04 10*3/uL (ref 0.00–0.07)
Basophils Absolute: 0 10*3/uL (ref 0.0–0.1)
Basophils Relative: 0 %
Eosinophils Absolute: 0 10*3/uL (ref 0.0–0.5)
Eosinophils Relative: 0 %
HCT: 35.7 % — ABNORMAL LOW (ref 36.0–46.0)
Hemoglobin: 10.7 g/dL — ABNORMAL LOW (ref 12.0–15.0)
Immature Granulocytes: 0 %
Lymphocytes Relative: 3 %
Lymphs Abs: 0.3 10*3/uL — ABNORMAL LOW (ref 0.7–4.0)
MCH: 29.3 pg (ref 26.0–34.0)
MCHC: 30 g/dL (ref 30.0–36.0)
MCV: 97.8 fL (ref 80.0–100.0)
Monocytes Absolute: 0.4 10*3/uL (ref 0.1–1.0)
Monocytes Relative: 4 %
Neutro Abs: 8.8 10*3/uL — ABNORMAL HIGH (ref 1.7–7.7)
Neutrophils Relative %: 93 %
Platelets: 130 10*3/uL — ABNORMAL LOW (ref 150–400)
RBC: 3.65 MIL/uL — ABNORMAL LOW (ref 3.87–5.11)
RDW: 13.2 % (ref 11.5–15.5)
WBC: 9.5 10*3/uL (ref 4.0–10.5)
nRBC: 0 % (ref 0.0–0.2)

## 2023-09-20 LAB — COMPREHENSIVE METABOLIC PANEL WITH GFR
ALT: 15 U/L (ref 0–44)
AST: 20 U/L (ref 15–41)
Albumin: 2.1 g/dL — ABNORMAL LOW (ref 3.5–5.0)
Alkaline Phosphatase: 58 U/L (ref 38–126)
Anion gap: 10 (ref 5–15)
BUN: 20 mg/dL (ref 8–23)
CO2: 34 mmol/L — ABNORMAL HIGH (ref 22–32)
Calcium: 8.9 mg/dL (ref 8.9–10.3)
Chloride: 101 mmol/L (ref 98–111)
Creatinine, Ser: 0.73 mg/dL (ref 0.44–1.00)
GFR, Estimated: >60 mL/min (ref >60)
Glucose, Bld: 104 mg/dL — ABNORMAL HIGH (ref 70–99)
Potassium: 3.5 mmol/L (ref 3.5–5.1)
Sodium: 145 mmol/L (ref 135–145)
Total Bilirubin: 0.6 mg/dL (ref 0.0–1.2)
Total Protein: 5.8 g/dL — ABNORMAL LOW (ref 6.5–8.1)

## 2023-09-20 LAB — RESPIRATORY PANEL BY PCR

## 2023-09-20 LAB — URINALYSIS, COMPLETE (UACMP) WITH MICROSCOPIC
Bacteria, UA: NONE SEEN
Bilirubin Urine: NEGATIVE
Glucose, UA: NEGATIVE mg/dL
Hgb urine dipstick: NEGATIVE
Ketones, ur: NEGATIVE mg/dL
Leukocytes,Ua: NEGATIVE
Nitrite: NEGATIVE
Protein, ur: NEGATIVE mg/dL
Specific Gravity, Urine: 1.018 (ref 1.005–1.030)
pH: 6 (ref 5.0–8.0)

## 2023-09-20 LAB — BLOOD GAS, VENOUS
Acid-Base Excess: 12.2 mmol/L — ABNORMAL HIGH (ref 0.0–2.0)
Bicarbonate: 42.8 mmol/L — ABNORMAL HIGH (ref 20.0–28.0)
O2 Saturation: 33.2 %
Patient temperature: 37
pCO2, Ven: 89 mmHg (ref 44–60)
pH, Ven: 7.29 (ref 7.25–7.43)
pO2, Ven: <31 mmHg — CL (ref 32–45)

## 2023-09-20 LAB — ECHOCARDIOGRAM COMPLETE
AR max vel: 2.54 cm2
AV Area VTI: 2.24 cm2
AV Area mean vel: 2.66 cm2
AV Mean grad: 3 mmHg
AV Peak grad: 7.3 mmHg
Ao pk vel: 1.35 m/s
Area-P 1/2: 4.17 cm2
Calc EF: 72.4 %
Height: 65 in
S' Lateral: 2.2 cm
Single Plane A2C EF: 72.7 %
Single Plane A4C EF: 71.6 %
Weight: 1520 [oz_av]

## 2023-09-20 LAB — MRSA NEXT GEN BY PCR, NASAL: MRSA by PCR Next Gen: NOT DETECTED

## 2023-09-20 LAB — I-STAT VENOUS BLOOD GAS, ED
Acid-Base Excess: 11 mmol/L — ABNORMAL HIGH (ref 0.0–2.0)
Bicarbonate: 32.6 mmol/L — ABNORMAL HIGH (ref 20.0–28.0)
Calcium, Ion: 0.97 mmol/L — ABNORMAL LOW (ref 1.15–1.40)
HCT: 41 % (ref 36.0–46.0)
Hemoglobin: 13.9 g/dL (ref 12.0–15.0)
O2 Saturation: 90 %
Potassium: 4.7 mmol/L (ref 3.5–5.1)
Sodium: 141 mmol/L (ref 135–145)
TCO2: 33 mmol/L — ABNORMAL HIGH (ref 22–32)
pCO2, Ven: 31.2 mmHg — ABNORMAL LOW (ref 44–60)
pH, Ven: 7.626 (ref 7.25–7.43)
pO2, Ven: 47 mmHg — ABNORMAL HIGH (ref 32–45)

## 2023-09-20 LAB — MAGNESIUM: Magnesium: 1.6 mg/dL — ABNORMAL LOW (ref 1.7–2.4)

## 2023-09-20 LAB — STREP PNEUMONIAE URINARY ANTIGEN: Strep Pneumo Urinary Antigen: NEGATIVE

## 2023-09-20 LAB — PHOSPHORUS: Phosphorus: 3.1 mg/dL (ref 2.5–4.6)

## 2023-09-20 LAB — BRAIN NATRIURETIC PEPTIDE: B Natriuretic Peptide: 138.6 pg/mL — ABNORMAL HIGH (ref 0.0–100.0)

## 2023-09-20 LAB — TROPONIN I (HIGH SENSITIVITY)
Troponin I (High Sensitivity): 660 ng/L (ref <18)
Troponin I (High Sensitivity): 51 ng/L — ABNORMAL HIGH (ref <18)

## 2023-09-20 LAB — I-STAT CG4 LACTIC ACID, ED: Lactic Acid, Venous: 1.3 mmol/L (ref 0.5–1.9)

## 2023-09-20 MED ORDER — ARTIFICIAL TEARS OPHTHALMIC OINT
TOPICAL_OINTMENT | OPHTHALMIC | Status: DC | PRN
  Filled 2023-09-20: qty 3.5

## 2023-09-20 MED ORDER — VALACYCLOVIR HCL 500 MG PO TABS
1000.0000 mg | ORAL_TABLET | Freq: Three times a day (TID) | ORAL | Status: DC
  Filled 2023-09-20 (×3): qty 2

## 2023-09-20 MED ORDER — GADOBUTROL 1 MMOL/ML IV SOLN
4.0000 mL | Freq: Once | INTRAVENOUS | Status: AC | PRN
Start: 2023-09-20 — End: 2023-09-20
  Administered 2023-09-20: 4 mL via INTRAVENOUS

## 2023-09-20 MED ORDER — PREDNISONE 20 MG PO TABS
60.0000 mg | ORAL_TABLET | Freq: Every day | ORAL | Status: DC
  Filled 2023-09-20: qty 3

## 2023-09-20 MED ORDER — MAGNESIUM SULFATE 2 GM/50ML IV SOLN
2.0000 g | Freq: Once | INTRAVENOUS | Status: AC
  Administered 2023-09-20: 2 g via INTRAVENOUS
  Filled 2023-09-20: qty 50

## 2023-09-20 MED ORDER — ENOXAPARIN SODIUM 30 MG/0.3ML IJ SOSY
30.0000 mg | PREFILLED_SYRINGE | INTRAMUSCULAR | Status: DC
  Administered 2023-09-20: 30 mg via SUBCUTANEOUS
  Filled 2023-09-20: qty 0.3

## 2023-09-20 MED ORDER — IOHEXOL 350 MG/ML SOLN
75.0000 mL | Freq: Once | INTRAVENOUS | Status: AC | PRN
  Administered 2023-09-20: 75 mL via INTRAVENOUS

## 2023-09-20 MED ORDER — METOPROLOL TARTRATE 12.5 MG HALF TABLET
12.5000 mg | ORAL_TABLET | Freq: Two times a day (BID) | ORAL | Status: DC
  Administered 2023-09-20 – 2023-09-22 (×2): 12.5 mg via ORAL
  Filled 2023-09-20 (×4): qty 1

## 2023-09-20 MED ORDER — LORAZEPAM 2 MG/ML IJ SOLN
1.0000 mg | INTRAMUSCULAR | Status: AC | PRN
  Administered 2023-09-20: 1 mg via INTRAVENOUS
  Filled 2023-09-20: qty 1

## 2023-09-20 MED ORDER — METRONIDAZOLE 500 MG/100ML IV SOLN
500.0000 mg | Freq: Two times a day (BID) | INTRAVENOUS | Status: DC
  Administered 2023-09-20 – 2023-09-22 (×4): 500 mg via INTRAVENOUS
  Filled 2023-09-20 (×4): qty 100

## 2023-09-20 NOTE — Progress Notes (Signed)
 RT attempted CPT by flutter valve with the patient but due to her weakness she was unable to use the flutter valve effectively. RT obtained chest vest equipment to attempt with patient but she is currently OTF. RN notified.

## 2023-09-20 NOTE — ED Notes (Signed)
 Helped pt sit up in bed so they could eat lunch

## 2023-09-20 NOTE — ED Notes (Signed)
 A fresh regular breakfast tray was ordered for the patient.  Her original tray was cold when it arrived.  Per the food and nutrition staff, her tray should arrive no later than 10:49am.

## 2023-09-20 NOTE — Progress Notes (Addendum)
 PROGRESS NOTE    Elizabeth Mayer  ZOX:[id number] DOB: Apr 21, 1960 DOA: 09/19/2023 PCP: [name], FNP     Brief Narrative:   Elizabeth Mayer is a 63 y.o. female with medical history significant for COPD, paroxysmal atrial fibrillation not on chronic anticoagulation, chronic hypoxic respiratory failure on 2 to 3 L continuous nasal cannula, non-small cell lung cancer with history of metastatic disease to the brain, who is admitted to [facility] on 09/19/2023 with severe sepsis due to community-acquired pneumonia after presenting from home to [facility] ED complaining of shortness of breath.    The patient reports 2 to 3 days of progressive shortness of breath associated with new onset productive cough as well as subjective fever.  Denies any associated mopped assist, nor any recent chest pain, palpitations, diaphoresis, dizziness, recently, or syncope.  No recent neck stiffness, abdominal pain, dysuria or gross hematuria. Has had some nausea with coughing, without associated vomiting.    She has a history of COPD complicated by chronic hypoxic respiratory failure on 2-3 continuous liters nasal cannula as an outpatient.  This is in the setting of being a former smoker, and reports good compliance with her outpatient breast tree.  In the context of her progressive shortness of breath over the last few days, she notes increased frequency of use of her prn albuterol  inhaler at home.  No recent wheezing.   She also has a history of paroxysmal atrial fibrillation, with the patient reporting a single prior episode of atrial fibrillation that occurred in the postoperative setting.  Given her report of a one-time postoperative recurrence of her atrial fibrillation, she has not been subsequently on anticoagulation.  Not on a scheduled AV nodal blocking agents at home.   Denies any known history of underlying heart failure.  Per dystrophia, no prior echocardiogram on file.   In the setting of the above  symptoms, patient patient contacted EMS earlier today, who noted the patient to be in atrial fibrillation with RVR, with heart rates in the 180s, corresponding with hypotension.  EMS was reported to have administered duo nebulizer treatments x 2, and brought the patient to [facility] ED for further evaluation and management thereof.     Assessment & Plan:   Principal Problem:   CAP (community acquired pneumonia) Active Problems:   Non-small cell lung cancer (HCC)   Atrial fibrillation with RVR (HCC)   Essential hypertension   Severe sepsis (HCC)   Leukocytosis   SOB (shortness of breath)   Elevated troponin   Acute on chronic hypoxic respiratory failure (HCC)   History of COPD   # CAP Presenting with dyspnea, CTA no PE but shows new opacification of bronchus intermedius and associated right middle and lower lobe consolidation. Could be 2/2 aspiration. Covid/flu/rsv neg. Procal markedly elevated - follow blood cultures, urine antigens - sputum for culture - mrsa swab - 20-agent respiratory panel - continue ceftriaxone/azithromycin , add flagyl - swallow eval - incentive spirometer - chest PT  # Dysphagia SLP eval today shows profound dysphagia, thinks will not be safe to swallow for minimum multiple days (if ever) - cortrak order to start tube feeds, aware this does not eliminate aspiration risk  # A-fib with RVR HR improved. Chads2vasc of 2, not anticoagulated outpatient - oral metoprolol started, titrate as needed  # Bell's palsy Appears to have new paralysis right side of face, unable to fully blink - start prednisone  60 daily - start valacyclovir 1000 mg tid - artificial tears ointment -  nursing order to tape right eyelid shut at bedtime  # COPD Think symptoms primarily driven by pneumonia - breztri  for home inhaler - on prednisone  as below  # Acute hypoxic/hypercarbic respiratory failure # Chronic hypoxic respiratory failure On 3-4 liters at home here  requiring 4, hypercarbic this morning, mentating clearly - will repeat vbg, if remains with significant hypercarbia will trial bipap - continue [place] o2  # Demand ischemia Troponin 57>83>660>51, suspect demand from RVR, denies chest pain. TTE preserved EF without RWMAs, EKG no overt signs ischemia - monitor  # History lung cancer With brain mets s/p radiation. Was due for MRI brain in April - ordering this MRI while here  # Debility - PT consult   DVT prophylaxis: lovenox Code Status: full Family Communication: none at bedside, phone number to brother doesn't appear to be in service  Level of care: Progressive Status is: Inpatient Remains inpatient appropriate because: severity of illness    Consultants:  none  Procedures: none  Antimicrobials:  See above    Subjective: Reports stable dyspnea  Objective: Vitals:   09/20/23 1000 09/20/23 1030 09/20/23 1100 09/20/23 1200  BP: 109/60 100/65 117/73 94/60  Pulse:    81  Resp: (!) 24 (!) 28 15 (!) 25  Temp:      TempSrc:      SpO2:    93%  Weight:      Height:       No intake or output data in the 24 hours ending 09/20/23 1431 Filed Weights   09/19/23 1608  Weight: 43.1 kg    Examination:  General exam: Appears calm and comfortable  Respiratory system: rales and decreased breath sounds on right Cardiovascular system: S1 & S2 heard, RRR.   Gastrointestinal system: Abdomen is nondistended, soft and nontender.   Central nervous system: Alert and oriented. Right sided facial droop including forehead Extremities: Symmetric  power, warm no edema Skin: No rashes, lesions or ulcers Psychiatry: Judgement and insight appear normal. Mood & affect appropriate.     Data Reviewed: I have personally reviewed following labs and imaging studies  CBC: Recent Labs  Lab 09/19/23 1739 09/20/23 0358  WBC 12.8* 9.5  NEUTROABS 11.7* 8.8*  HGB 13.0 10.7*  HCT 42.8 35.7*  MCV 99.3 97.8  PLT 179 130*   Basic Metabolic  Panel: Recent Labs  Lab 09/19/23 1739 09/20/23 0358  NA 144 145  K 5.4* 3.5  CL 102 101  CO2 29 34*  GLUCOSE 116* 104*  BUN 26* 20  CREATININE 0.96 0.73  CALCIUM  8.8* 8.9  MG 2.1 1.6*  PHOS  --  3.1   GFR: Estimated Creatinine Clearance: 48.3 mL/min (by C-G formula based on SCr of 0.73 mg/dL). Liver Function Tests: Recent Labs  Lab 09/19/23 1739 09/20/23 0358  AST 48* 20  ALT 27 15  ALKPHOS 79 58  BILITOT 1.9* 0.6  PROT 6.3* 5.8*  ALBUMIN 2.3* 2.1*   No results for input(s): LIPASE, AMYLASE in the last 168 hours. No results for input(s): AMMONIA in the last 168 hours. Coagulation Profile: No results for input(s): INR, PROTIME in the last 168 hours. Cardiac Enzymes: No results for input(s): CKTOTAL, CKMB, CKMBINDEX, TROPONINI in the last 168 hours. BNP (last 3 results) No results for input(s): PROBNP in the last 8760 hours. HbA1C: No results for input(s): HGBA1C in the last 72 hours. CBG: No results for input(s): GLUCAP in the last 168 hours. Lipid Profile: No results for input(s): CHOL, HDL, LDLCALC, TRIG, CHOLHDL,  LDLDIRECT in the last 72 hours. Thyroid  Function Tests: Recent Labs    09/19/23 2142  TSH 1.334   Anemia Panel: No results for input(s): VITAMINB12, FOLATE, FERRITIN, TIBC, IRON, RETICCTPCT in the last 72 hours. Urine analysis:    Component Value Date/Time   COLORURINE YELLOW 09/20/2023 0358   APPEARANCEUR CLEAR 09/20/2023 0358   LABSPEC 1.018 09/20/2023 0358   PHURINE 6.0 09/20/2023 0358   GLUCOSEU NEGATIVE 09/20/2023 0358   HGBUR NEGATIVE 09/20/2023 0358   HGBUR negative 10/31/2007 1057   BILIRUBINUR NEGATIVE 09/20/2023 0358   BILIRUBINUR negative 02/15/2023 1427   BILIRUBINUR negative 02/10/2022 1623   KETONESUR NEGATIVE 09/20/2023 0358   PROTEINUR NEGATIVE 09/20/2023 0358   UROBILINOGEN 0.2 02/15/2023 1427   UROBILINOGEN 0.2 10/31/2007 1057   NITRITE NEGATIVE 09/20/2023 0358    LEUKOCYTESUR NEGATIVE 09/20/2023 0358   Sepsis Labs: @LABRCNTIP (procalcitonin:4,lacticidven:4)  ) Recent Results (from the past 240 hours)  Resp panel by RT-PCR (RSV, Flu A&B, Covid) Anterior Nasal Swab     Status: None   Collection Time: 09/19/23  3:44 PM   Specimen: Anterior Nasal Swab  Result Value Ref Range Status   SARS Coronavirus 2 by RT PCR NEGATIVE NEGATIVE Final   Influenza A by PCR NEGATIVE NEGATIVE Final   Influenza B by PCR NEGATIVE NEGATIVE Final    Comment: (NOTE) The Xpert Xpress SARS-CoV-2/FLU/RSV plus assay is intended as an aid in the diagnosis of influenza from Nasopharyngeal swab specimens and should not be used as a sole basis for treatment. Nasal washings and aspirates are unacceptable for Xpert Xpress SARS-CoV-2/FLU/RSV testing.  Fact Sheet for Patients: BloggerCourse.com  Fact Sheet for Healthcare Providers: SeriousBroker.it  This test is not yet approved or cleared by the [place]  FDA and has been authorized for detection and/or diagnosis of SARS-CoV-2 by FDA under an Emergency Use Authorization (EUA). This EUA will remain in effect (meaning this test can be used) for the duration of the COVID-19 declaration under Section 564(b)(1) of the Act, 21 U.S.C. section 360bbb-3(b)(1), unless the authorization is terminated or revoked.     Resp Syncytial Virus by PCR NEGATIVE NEGATIVE Final    Comment: (NOTE) Fact Sheet for Patients: BloggerCourse.com  Fact Sheet for Healthcare Providers: SeriousBroker.it  This test is not yet approved or cleared by the [place]  FDA and has been authorized for detection and/or diagnosis of SARS-CoV-2 by FDA under an Emergency Use Authorization (EUA). This EUA will remain in effect (meaning this test can be used) for the duration of the COVID-19 declaration under Section 564(b)(1) of the Act, 21 U.S.C. section  360bbb-3(b)(1), unless the authorization is terminated or revoked.  Performed at [facility], [address] [place], [place] [address]   Culture, blood (routine x 2)     Status: None (Preliminary result)   Collection Time: 09/19/23  5:54 PM   Specimen: BLOOD  Result Value Ref Range Status   Specimen Description BLOOD BLOOD RIGHT ARM  Final   Special Requests   Final    BOTTLES DRAWN AEROBIC ONLY Blood Culture adequate volume   Culture   Final    NO GROWTH < 12 HOURS Performed at [facility], [address], [place], [place] [address]    Report Status PENDING  Incomplete  Culture, blood (routine x 2)     Status: None (Preliminary result)   Collection Time: 09/19/23  5:59 PM   Specimen: BLOOD  Result Value Ref Range Status   Specimen Description BLOOD BLOOD RIGHT HAND  Final   Special Requests   Final    BOTTLES DRAWN AEROBIC ONLY Blood Culture adequate volume   Culture   Final    NO GROWTH < 12 HOURS Performed at [facility], [address] [place], [place] [address]    Report Status PENDING  Incomplete         Radiology Studies: ECHOCARDIOGRAM COMPLETE Result Date: 09/20/2023    ECHOCARDIOGRAM REPORT   Patient Name:   IVA MONTELONGO Date of Exam: 09/20/2023 Medical Rec #:  [id number]       Height:       65.0 in Accession #:    [id number]      Weight:       95.0 lb Date of Birth:  July 07, 1960        BSA:          1.442 m Patient Age:    62 years        BP:           117/74 mmHg Patient Gender: F               HR:           90 bpm. Exam Location:  Inpatient Procedure: 2D Echo, Color Doppler and Cardiac Doppler (Both Spectral and Color            Flow Doppler were utilized during procedure). Indications:    I48.91* Unspeicified atrial fibrillation  History:        Patient has no prior history of Echocardiogram examinations.  Sonographer:    Andrena Bang Referring Phys: [id number] JUSTIN B HOWERTER IMPRESSIONS  1. Very mild intracavitary gradient. Peak velocity 1.03  m/s. Peak gradient 4.2 mmHg. Left ventricular ejection fraction, by estimation, is 65 to 70%. The left ventricle has normal function. The left ventricle has no regional wall motion abnormalities. There is mild concentric left ventricular hypertrophy. Left ventricular diastolic parameters are consistent with Grade I diastolic dysfunction (impaired relaxation).  2. Right ventricular systolic function is normal. The right ventricular size is normal.  3. A small pericardial effusion is present. There is no evidence of cardiac tamponade.  4. The mitral valve is normal in structure. Trivial mitral valve regurgitation. No evidence of mitral stenosis.  5. The aortic valve is normal in structure. There is mild calcification of the aortic valve. There is mild thickening of the aortic valve. Aortic valve regurgitation is mild. No aortic stenosis is present.  6. Unable to fully assess right atrial pressure. Patient unable to sniff. FINDINGS  Left Ventricle: Very mild intracavitary gradient. Peak velocity 1.03 m/s. Peak gradient 4.2 mmHg. Left ventricular ejection fraction, by estimation, is 65 to 70%. The left ventricle has normal function. The left ventricle has no regional wall motion abnormalities. The left ventricular internal cavity size was normal in size. There is mild concentric left ventricular hypertrophy. Left ventricular diastolic parameters are consistent with Grade I diastolic dysfunction (impaired relaxation). Right Ventricle: The right ventricular size is normal. No increase in right ventricular wall thickness. Right ventricular systolic function is normal. Left Atrium: Left atrial size was normal in size. Right Atrium: Right atrial size was normal in size. Pericardium: A small pericardial effusion is present. There is excessive respiratory variation in the tricuspid valve spectral Doppler velocities. There is no evidence of cardiac tamponade. Mitral Valve: The mitral valve is normal in structure. Trivial mitral  valve regurgitation. No evidence of mitral valve stenosis. Tricuspid Valve: The tricuspid valve is normal in structure.  Tricuspid valve regurgitation is trivial. No evidence of tricuspid stenosis. Aortic Valve: The aortic valve is normal in structure. There is mild calcification of the aortic valve. There is mild thickening of the aortic valve. Aortic valve regurgitation is mild. No aortic stenosis is present. Aortic valve mean gradient measures 3.0 mmHg. Aortic valve peak gradient measures 7.3 mmHg. Aortic valve area, by VTI measures 2.24 cm. Pulmonic Valve: The pulmonic valve was normal in structure. Pulmonic valve regurgitation is not visualized. No evidence of pulmonic stenosis. Aorta: The aortic root is normal in size and structure. Venous: Unable to fully assess right atrial pressure. Patient unable to sniff. IAS/Shunts: No atrial level shunt detected by color flow Doppler.  LEFT VENTRICLE PLAX 2D LVIDd:         3.00 cm     Diastology LVIDs:         2.20 cm     LV e' medial:    7.93 cm/s LV PW:         1.20 cm     LV E/e' medial:  9.3 LV IVS:        1.20 cm     LV e' lateral:   13.80 cm/s LVOT diam:     1.90 cm     LV E/e' lateral: 5.3 LV SV:         57 LV SV Index:   39 LVOT Area:     2.84 cm  LV Volumes (MOD) LV vol d, MOD A2C: 61.2 ml LV vol d, MOD A4C: 60.3 ml LV vol s, MOD A2C: 16.7 ml LV vol s, MOD A4C: 17.1 ml LV SV MOD A2C:     44.5 ml LV SV MOD A4C:     60.3 ml LV SV MOD BP:      44.4 ml RIGHT VENTRICLE RV S prime:     22.70 cm/s TAPSE (M-mode): 1.6 cm LEFT ATRIUM           Index LA diam:      2.70 cm 1.87 cm/m LA Vol (A2C): 26.1 ml 18.10 ml/m LA Vol (A4C): 28.6 ml 19.84 ml/m  AORTIC VALVE AV Area (Vmax):    2.54 cm AV Area (Vmean):   2.66 cm AV Area (VTI):     2.24 cm AV Vmax:           135.00 cm/s AV Vmean:          74.400 cm/s AV VTI:            0.253 m AV Peak Grad:      7.3 mmHg AV Mean Grad:      3.0 mmHg LVOT Vmax:         121.00 cm/s LVOT Vmean:        69.700 cm/s LVOT VTI:           0.200 m LVOT/AV VTI ratio: 0.79  AORTA Ao Asc diam: 2.70 cm MITRAL VALVE MV Area (PHT): 4.17 cm    SHUNTS MV Decel Time: 182 msec    Systemic VTI:  0.20 m MV E velocity: 73.50 cm/s  Systemic Diam: 1.90 cm MV A velocity: 69.80 cm/s MV E/A ratio:  1.05 Maudine Sos MD Electronically signed by Maudine Sos MD Signature Date/Time: 09/20/2023/11:09:34 AM    Final    CT Angio Chest Pulmonary Embolism (PE) W or WO Contrast Result Date: 09/20/2023 CLINICAL DATA:  63 year old female with shortness of breath. Lung cancer originally diagnosed in 2021. * Tracking Code: BO * EXAM: CT ANGIOGRAPHY  CHEST WITH CONTRAST TECHNIQUE: Multidetector CT imaging of the chest was performed using the standard protocol during bolus administration of intravenous contrast. Multiplanar CT image reconstructions and MIPs were obtained to evaluate the vascular anatomy. RADIATION DOSE REDUCTION: This exam was performed according to the departmental dose-optimization program which includes automated exposure control, adjustment of the mA and/or kV according to patient size and/or use of iterative reconstruction technique. CONTRAST:  75mL OMNIPAQUE  IOHEXOL  350 MG/ML SOLN COMPARISON:  Restaging chest CT 05/19/2022 and earlier. FINDINGS: Cardiovascular: Good contrast bolus timing in the pulmonary arterial tree. Extensive enhancing chest wall and paravertebral venous collaterals. At apparent chronic partial right lower lobe pneumonectomy. Right lower lobe pulmonary artery branches appear to be chronically absent. Tapered appearance of right upper lobe branches. But no pulmonary artery filling defect identified bilaterally. Right chest Port-A-Cath is chronic. Stable thoracic aorta. Heart size remains normal. No pericardial effusion. Mediastinum/Nodes: No convincing mediastinal mass or lymphadenopathy. Lungs/Pleura: Abnormal right lung. Chronic perihilar post radiation changes demonstrated last year with perihilar bronchiectasis. Progressive  surrounding lung consolidation since that time, and new opacification of the right bronchus intermedius, abruptly and just distal to the right mainstem bronchus on series 6, image 59. Confluent opacity throughout the residual right lower and middle lobes, with air bronchograms. Bronchiectasis. Difficult to exclude early cavitation. And no enhancement of the lung parenchyma there. Superimposed adjacent right upper lobe peribronchial inflammatory appearing opacity, series 6, image 67. Contralateral left lung with centrilobular emphysema. Left upper lobe and lingula are stable from last year but there is new left lower lobe posterior basal segment confluent peribronchial opacity with air bronchograms, but also partially enhancing lung parenchyma there. Left lung airways remain patent. Superimposed small partially sub pulmonic right pleural effusion, simple fluid density. Upper Abdomen: Grossly stable and negative visible early contrast appearance of the upper abdominal viscera. Musculoskeletal: Enhancing chest wall and paravertebral venous collaterals. No acute or suspicious osseous lesion is identified. Review of the MIP images confirms the above findings. IMPRESSION: 1. Negative for acute pulmonary embolus. 2. Positive for chronic post treatment changes to the right lung, including chronic radiation pneumonitis. But superimposed newly opacified bronchus intermedius and associated Severe New Right middle and lower lobe Consolidation. Large volume Aspiration or Necrotizing Pneumonia are not excluded. Small superimposed right pleural effusion. Early infectious involvement of the adjacent right upper lobe. And contralateral segmental consolidation in the Left Lower Lobe. 3.  Emphysema (ICD10-J43.9). Electronically Signed   By: Marlise Simpers M.D.   On: 09/20/2023 06:36   DG Chest Portable 1 View Result Date: 09/19/2023 CLINICAL DATA:  Short of breath, history of metastatic non-small cell lung cancer EXAM: PORTABLE CHEST 1  VIEW COMPARISON:  03/25/2021, 05/19/2022 FINDINGS: Single frontal view of the chest demonstrates right chest wall port tip overlying atriocaval junction. External defibrillator pads overlie the chest. Cardiac silhouette is unremarkable. There is prominence of the right hilum again noted, with new right basilar consolidation and right pleural effusion. Chronic elevation of the right hemidiaphragm. No pneumothorax. Left chest is clear. No acute bony abnormalities. IMPRESSION: 1. Right basilar consolidation and right pleural effusion, new since prior studies. I would favor acute pneumonia or aspiration, though close follow-up after treatment is recommended to exclude recurrent malignancy. 2. Stable right hilar prominence, consistent with post therapeutic changes and bronchiectasis seen on prior CT. Electronically Signed   By: Bobbye Burrow M.D.   On: 09/19/2023 17:31        Scheduled Meds:  budesonide -glycopyrrolate-formoterol   2 puff Inhalation BID  guaiFENesin  20 mL Oral TID   metoprolol tartrate  12.5 mg Oral BID   Continuous Infusions:  azithromycin      cefTRIAXone (ROCEPHIN)  IV Stopped (09/19/23 2104)     LOS: 1 day     Raymonde Calico, MD [facility]   If 7PM-7AM, please contact night-coverage [email or web address] Password [facility] 09/20/2023, 2:31 PM

## 2023-09-20 NOTE — ED Notes (Signed)
 Secretary called dietary for fresh breakfast tray & mini lab contacted to straight stick for troponin

## 2023-09-20 NOTE — ED Notes (Signed)
 The patient stated that she was cold and requested that her heat be turned up.  Facilitates was contacted to complete the request.

## 2023-09-20 NOTE — ED Notes (Signed)
Gave pt warm blanket.

## 2023-09-20 NOTE — Progress Notes (Signed)
*  PRELIMINARY RESULTS* Echocardiogram 2D Echocardiogram has been performed.  Elizabeth Mayer 09/20/2023, 8:39 AM

## 2023-09-20 NOTE — Evaluation (Signed)
 Clinical/Bedside Swallow Evaluation Patient Details  Name: Elizabeth Mayer MRN: [id number] Date of Birth: September 15, 1960  Today's Date: 09/20/2023 Time: SLP Start Time (ACUTE ONLY): 1450 SLP Stop Time (ACUTE ONLY): 1535 SLP Time Calculation (min) (ACUTE ONLY): 45 min  Past Medical History:  Past Medical History:  Diagnosis Date   Anemia    Angina 1982   related to stress   Asthma    in the past,  no current problems   Atrial fibrillation (HCC)    Complication of anesthesia    states anesthesia made her hair fall out, old meds. hard to awaken 1 time she took Flexeril  before.   Constipation    COPD (chronic obstructive pulmonary disease) (HCC)    Dyspnea    on oxygen  at home - 3L via [place]   Dysrhythmia    Fall 06/01/2023   Fatigue    GERD (gastroesophageal reflux disease)    patient denies this dx   Heart murmur 1970s   no problems currently   Hypertension    Ingrown toenail    Lung cancer (HCC) 10/2019   metastatic disease to the brain   On home oxygen  therapy    2L via [place] - 24 hours a day   Past heart attack 1980-1981   pt states she passed out and woke up in hospital- told she had heart attack, but then dr said he couldn't find anything wrong.   Peripheral vascular disease (HCC)    Pneumonia    x 1   Past Surgical History:  Past Surgical History:  Procedure Laterality Date   CERVICAL DISC SURGERY  2000   Disc removed from neck    ingrown toe nail surgery Bilateral    IR IMAGING GUIDED PORT INSERTION  02/16/2020   MULTIPLE TOOTH EXTRACTIONS     for braces   RADIOLOGY WITH ANESTHESIA N/A 12/05/2019   Procedure: MRI BRAIN WITH AND WITHOUT CONTRAST;  Surgeon: Radiologist, Medication, MD;  Location: [facility] OR;  Service: Radiology;  Laterality: N/A;   RADIOLOGY WITH ANESTHESIA N/A 01/02/2020   Procedure: MRI BRAIN WITH AND WITHOUT CONTRAST;  Surgeon: Radiologist, Medication, MD;  Location: [facility] OR;  Service: Radiology;  Laterality: N/A;   RADIOLOGY WITH ANESTHESIA N/A 02/20/2020    Procedure: MRI WITH ANESTHESIA OF BRAIN WITH AND WITHOUT CONTRAST;  Surgeon: Radiologist, Medication, MD;  Location: [facility] OR;  Service: Radiology;  Laterality: N/A;   RADIOLOGY WITH ANESTHESIA N/A 06/11/2020   Procedure: MRI WITH ANESTHESIA OF BRAIN WITH AND WITHOUT CONTRAST;  Surgeon: Radiologist, Medication, MD;  Location: [facility] OR;  Service: Radiology;  Laterality: N/A;   RADIOLOGY WITH ANESTHESIA N/A 10/10/2020   Procedure: MRI WITH ANESTHESIA BRAIN WITH AND WITHOUT CONTRAST;  Surgeon: Radiologist, Medication, MD;  Location: [facility];  Service: Radiology;  Laterality: N/A;   RADIOLOGY WITH ANESTHESIA N/A 01/30/2021   Procedure: MRI BRAIN WITH AND WITHOUT CONTRASTWITH ANESTHESIA;  Surgeon: Radiologist, Medication, MD;  Location: [facility];  Service: Radiology;  Laterality: N/A;   RADIOLOGY WITH ANESTHESIA N/A 06/12/2021   Procedure: MRI WITH ANESTHESIA OF BRAIN WITH AND WITHOUT CONTRAST;  Surgeon: Radiologist, Medication, MD;  Location: [facility];  Service: Radiology;  Laterality: N/A;   RADIOLOGY WITH ANESTHESIA N/A 09/11/2021   Procedure: MRI OF BRAIN WITH AND WITHOUT CONTRAST WITH ANESTHESIA;  Surgeon: Radiologist, Medication, MD;  Location: [facility];  Service: Radiology;  Laterality: N/A;   RADIOLOGY WITH ANESTHESIA N/A 01/15/2022   Procedure: MRI BRAIN WITH AND WITHOUT CONTRAST WITH ANESTHESIA;  Surgeon: Radiologist, Medication, MD;  Location: [facility];  Service: Radiology;  Laterality: N/A;   RADIOLOGY WITH ANESTHESIA N/A 05/14/2022   Procedure: MRI WITH ANESTHESIA BRAIN WITH AND WITHOUT CONTRAST;  Surgeon: Radiologist, Medication, MD;  Location: [facility];  Service: Radiology;  Laterality: N/A;   TONSILLECTOMY     HPI:  Pt is a 63 year old female who is admitted to [facility] on 09/19/2023 with severe sepsis due to community-acquired pneumonia after presenting from home to [facility] ED complaining of shortness of breath.  Pt initially reported hearing and swallowing changes to ENT on 08/26/23.  Right facial nerve paralysis  documented as new that date as well. Pt stated at that time that her voice had been very bad for two weeks prior to appointment but also had been reporting voice changes since radiation for metastatic lung cancer in 2020. ENT scoped pt and diagnosed with left vocal cord immobility since 2020, but now NEW bilateral vocal cord paralysis on strobeoscopy with a 2-3 mm glottic gap, right sensorineural hearing loss and new right facial nerve paralysis. Pt was treated with steroids and scheduled for a CT neck, an MBS (that was scheduled in [place] for 6/16 - day of admission to [facility] so not completed), and strict precautions for worsening airway stridor with concern for potential need for trach. PMH additionally significant for COPD, paroxysmal atrial fibrillation not on chronic anticoagulation, chronic hypoxic respiratory failure on 2 to 3 L continuous nasal cannula, non-small cell lung cancer with history of metastatic disease to the brain.    Assessment / Plan / Recommendation  Clinical Impression  Pt presents with signs of severe cranial nerve weakness including right CN VII and X. RIght lips are flaccid, there is decreased movement of right palatal wall and severe hypernasal resonance. Pts voice is dysphonic, which she states is from her history of radiation in 2020 with left vocal fold paralysis. Chart review shows an ENT stroboscopy on 08/26/23 diagnosed NEW BILATERAL VF PARALYSIS with 2 MM chink, stridor and concern for airway patency. Pt reports dysphagia, says she can only drink with a straw due to anterior spillage. However, when straw attempted, pt has no negative pressure to draw from straw. SLP had to give total asisst for labial closure and pinch nose for pt to draw up liquid which resulted in severe immediate ineffective and dysphonic cough. Unclear how pt has been able to eat and drink at all. Pts speech is moderately unintelligible. Recommend NPO and MBS. Neuro consult and Cortrak placement warranted  given severity of dysphagia. Reported findings to MD. SLP Visit Diagnosis: Dysphagia, oropharyngeal phase (R13.12)    Aspiration Risk  Severe aspiration risk;Risk for inadequate nutrition/hydration    Diet Recommendation NPO;Alternative means - temporary         Other  Recommendations Oral Care Recommendations: Oral care QID Caregiver Recommendations: Have oral suction available     Assistance Recommended at Discharge    Functional Status Assessment Patient has had a recent decline in their functional status and demonstrates the ability to make significant improvements in function in a reasonable and predictable amount of time.  Frequency and Duration            Prognosis Prognosis for improved oropharyngeal function: Guarded Barriers to Reach Goals: Severity of deficits      Swallow Study   General HPI: Pt is a 63 year old female who is admitted to [facility] on 09/19/2023 with severe sepsis due to community-acquired pneumonia after presenting from home to [facility] ED  complaining of shortness of breath.  Pt initially reported hearing and swallowing changesto ENT on 08/26/23.  Right facial nerve paralysis documented as new that date as well. Pt stated at that time that her voice had been very bad for two weeks prior to appointment but also had been reporting voice changes since radiation for metastatic lung cancer in 2020 and ENT scoped pt and diagnosed with left vocal cord immobility since 2020, but now NEW bilateral vocal cord paralysis on strobeoscopy with a 2-3 mm glottic gap, right sensorineural hearing loss and new right facial nerve paralysis. Pt was scheduled for a CT neck, an MBS (that was scheduled in [place] for 6/16 - day of admission to [facility] so not completed), and strict precautions for worsening airway stridor with concern for potential need for trach. PMH additionally significant for COPD, paroxysmal atrial fibrillation not on chronic anticoagulation, chronic hypoxic  respiratory failure on 2 to 3 L continuous nasal cannula, non-small cell lung cancer with history of metastatic disease to the brain. Type of Study: Bedside Swallow Evaluation Diet Prior to this Study: Regular;Thin liquids (Level 0) Temperature Spikes Noted: No History of Recent Intubation: No Behavior/Cognition: Alert;Cooperative;Confused Oral Cavity Assessment: Dry Oral Care Completed by SLP: Yes Oral Cavity - Dentition: Adequate natural dentition Vision: Functional for self-feeding Patient Positioning: Upright in bed Baseline Vocal Quality: Suspected CN X (Vagus) involvement Volitional Cough: Weak Volitional Swallow: Unable to elicit    Oral/Motor/Sensory Function Overall Oral Motor/Sensory Function: Severe impairment Facial ROM: Reduced right;Suspected CN VII (facial) dysfunction Facial Symmetry: Abnormal symmetry right;Suspected CN VII (facial) dysfunction Facial Strength: Reduced right;Suspected CN VII (facial) dysfunction Lingual ROM: Within Functional Limits Lingual Symmetry: Within Functional Limits Lingual Strength: Within Functional Limits Lingual Sensation: Within Functional Limits Velum: Impaired right;Suspected CN X (Vagus) dysfunction Mandible: Within Functional Limits   Ice Chips     Thin Liquid Thin Liquid: Impaired Presentation: Straw Oral Phase Impairments: Reduced labial seal Pharyngeal  Phase Impairments: Cough - Immediate;Decreased hyoid-laryngeal movement    Nectar Thick Nectar Thick Liquid: Not tested   Honey Thick Honey Thick Liquid: Not tested   Puree Puree: Not tested   Solid     Solid: Not tested      Valeri Gate 09/20/2023,4:02 PM

## 2023-09-21 ENCOUNTER — Inpatient Hospital Stay (HOSPITAL_COMMUNITY)

## 2023-09-21 DIAGNOSIS — C7931 Secondary malignant neoplasm of brain: Secondary | ICD-10-CM

## 2023-09-21 DIAGNOSIS — C3490 Malignant neoplasm of unspecified part of unspecified bronchus or lung: Secondary | ICD-10-CM | POA: Diagnosis not present

## 2023-09-21 DIAGNOSIS — E43 Unspecified severe protein-calorie malnutrition: Secondary | ICD-10-CM | POA: Insufficient documentation

## 2023-09-21 DIAGNOSIS — Z515 Encounter for palliative care: Secondary | ICD-10-CM

## 2023-09-21 DIAGNOSIS — J9621 Acute and chronic respiratory failure with hypoxia: Secondary | ICD-10-CM

## 2023-09-21 DIAGNOSIS — J189 Pneumonia, unspecified organism: Secondary | ICD-10-CM | POA: Diagnosis not present

## 2023-09-21 LAB — GLUCOSE, CAPILLARY
Glucose-Capillary: 90 mg/dL (ref 70–99)
Glucose-Capillary: 86 mg/dL (ref 70–99)
Glucose-Capillary: 94 mg/dL (ref 70–99)
Glucose-Capillary: 103 mg/dL — ABNORMAL HIGH (ref 70–99)
Glucose-Capillary: 123 mg/dL — ABNORMAL HIGH (ref 70–99)

## 2023-09-21 LAB — CBC
HCT: 43.5 % (ref 36.0–46.0)
Hemoglobin: 12.7 g/dL (ref 12.0–15.0)
MCH: 29.3 pg (ref 26.0–34.0)
MCHC: 29.2 g/dL — ABNORMAL LOW (ref 30.0–36.0)
MCV: 100.5 fL — ABNORMAL HIGH (ref 80.0–100.0)
Platelets: 189 10*3/uL (ref 150–400)
RBC: 4.33 MIL/uL (ref 3.87–5.11)
RDW: 13.1 % (ref 11.5–15.5)
WBC: 11.8 10*3/uL — ABNORMAL HIGH (ref 4.0–10.5)
nRBC: 0 % (ref 0.0–0.2)

## 2023-09-21 LAB — BLOOD GAS, ARTERIAL
Acid-Base Excess: 12.3 mmol/L — ABNORMAL HIGH (ref 0.0–2.0)
Bicarbonate: 42.2 mmol/L — ABNORMAL HIGH (ref 20.0–28.0)
Drawn by: 548791
O2 Saturation: 100 %
Patient temperature: 36.7
pCO2 arterial: 81 mmHg (ref 32–48)
pH, Arterial: 7.32 — ABNORMAL LOW (ref 7.35–7.45)
pO2, Arterial: 229 mmHg — ABNORMAL HIGH (ref 83–108)

## 2023-09-21 LAB — BASIC METABOLIC PANEL WITH GFR
Anion gap: 13 (ref 5–15)
BUN: 13 mg/dL (ref 8–23)
CO2: 27 mmol/L (ref 22–32)
Calcium: 8.5 mg/dL — ABNORMAL LOW (ref 8.9–10.3)
Chloride: 105 mmol/L (ref 98–111)
Creatinine, Ser: 0.64 mg/dL (ref 0.44–1.00)
GFR, Estimated: >60 mL/min (ref >60)
Glucose, Bld: 90 mg/dL (ref 70–99)
Potassium: 4.3 mmol/L (ref 3.5–5.1)
Sodium: 145 mmol/L (ref 135–145)

## 2023-09-21 LAB — MAGNESIUM: Magnesium: 1.8 mg/dL (ref 1.7–2.4)

## 2023-09-21 LAB — BRAIN NATRIURETIC PEPTIDE: B Natriuretic Peptide: 268.8 pg/mL — ABNORMAL HIGH (ref 0.0–100.0)

## 2023-09-21 LAB — PROCALCITONIN: Procalcitonin: 2.4 ng/mL

## 2023-09-21 MED ORDER — MIDAZOLAM HCL 2 MG/2ML IJ SOLN
2.0000 mg | Freq: Once | INTRAMUSCULAR | Status: DC
  Filled 2023-09-21: qty 2

## 2023-09-21 MED ORDER — CHLORHEXIDINE GLUCONATE CLOTH 2 % EX PADS
6.0000 | MEDICATED_PAD | Freq: Every day | CUTANEOUS | Status: DC
  Administered 2023-09-21 – 2023-09-29 (×9): 6 via TOPICAL

## 2023-09-21 MED ORDER — SODIUM BICARBONATE 8.4 % IV SOLN
50.0000 meq | Freq: Once | INTRAVENOUS | Status: AC
  Administered 2023-09-21: 50 meq via INTRAVENOUS
  Filled 2023-09-21: qty 50

## 2023-09-21 MED ORDER — ETOMIDATE 2 MG/ML IV SOLN
0.3000 mg/kg | Freq: Once | INTRAVENOUS | Status: DC
  Filled 2023-09-21: qty 10

## 2023-09-21 MED ORDER — ENOXAPARIN SODIUM 40 MG/0.4ML IJ SOSY
40.0000 mg | PREFILLED_SYRINGE | INTRAMUSCULAR | Status: DC
  Administered 2023-09-21 – 2023-09-27 (×7): 40 mg via SUBCUTANEOUS
  Filled 2023-09-21 (×8): qty 0.4

## 2023-09-21 MED ORDER — DEXTROSE IN LACTATED RINGERS 5 % IV SOLN: INTRAVENOUS | Status: DC

## 2023-09-21 MED ORDER — FENTANYL CITRATE PF 50 MCG/ML IJ SOSY
50.0000 ug | PREFILLED_SYRINGE | Freq: Once | INTRAMUSCULAR | Status: DC
  Filled 2023-09-21: qty 1

## 2023-09-21 MED ORDER — ROCURONIUM BROMIDE 10 MG/ML (PF) SYRINGE
1.0000 mg/kg | PREFILLED_SYRINGE | Freq: Once | INTRAVENOUS | Status: DC
  Filled 2023-09-21: qty 10

## 2023-09-21 MED ORDER — LACTATED RINGERS IV BOLUS
1000.0000 mL | Freq: Once | INTRAVENOUS | Status: AC
  Administered 2023-09-21: 1000 mL via INTRAVENOUS

## 2023-09-21 MED ORDER — METHYLPREDNISOLONE SODIUM SUCC 125 MG IJ SOLR
60.0000 mg | Freq: Every day | INTRAMUSCULAR | Status: DC
  Administered 2023-09-21 – 2023-09-24 (×4): 60 mg via INTRAVENOUS
  Filled 2023-09-21 (×4): qty 2

## 2023-09-21 NOTE — Progress Notes (Signed)
 PROGRESS NOTE                                                                                                                                                                                                             Patient Demographics:    Elizabeth Mayer, is a 63 y.o. female, DOB - 1960/09/24, ZOX:[id number]  Outpatient Primary MD for the patient is [name], FNP    LOS - 2  Admit date - 09/19/2023    Chief Complaint  Patient presents with   Shortness of Breath       Brief Narrative (HPI from H&P)     63 y.o. female with medical history significant for COPD, paroxysmal atrial fibrillation not on chronic anticoagulation, chronic hypoxic respiratory failure on 2 to 3 L continuous nasal cannula, non-small cell lung cancer with history of metastatic disease to the brain, who is admitted to [facility] on 09/19/2023 with severe sepsis due to community-acquired pneumonia after presenting from home to [facility] ED complaining of shortness of breath which was progressive over the last 2 to 3 days, in the ER was diagnosed with acute hypoxic respiratory failure due to aspiration pneumonia, further workup also showed that she had facial droop for which MRI brain was done and found to have a brain mass.  Patient was transferred to my care on 09/21/2023, upon my arrival in the room patient is extremely lethargic, unable to maintain her airway, ABGs suggest hypercapnic spirited failure, already on 4 L nasal cannula oxygen , she will be intubated and moved to ICU.   Subjective:    Elizabeth Mayer today in bed, extremely tired and fatigued, unable to answer questions appropriately but does agree that she is short of breath   Assessment  & Plan :   Acute hypoxic and hypercapnic and hypoxic respiratory failure.  Due to aspiration pneumonia from bilateral vocal cord paralysis, also now has evidence of a brain mass. She extremely lethargic  morning of 09/21/2023 upon my arrival to the room, ABG suggest hypercapnia, already on 4 L nasal cannula oxygen , recently diagnosed with bilateral vocal cord paralysis, have requested ICU team to intubate her and move her to ICU, continue antibiotics.  Recently diagnosed bilateral vocal cord paralysis, underlying dysphagia - she follows with [name], ENT at [facility], case discussed with her this morning, patient  was to get elective tracheostomy, she is moving to ICU likely will get tracheostomy prior to extubation with continued speech therapy and ENT follow-up.  History of lung cancer, brain mass, facial droop.  Placed on steroids, requested palliative care and PCCM to see her, for now she is full code, phone number for patient's brother is not working, for now moving to ICU.  May require neurosurgery evaluation.    Paroxysmal A-fib RVR.  New diagnosis.  Beta-blocker, not anticoagulation candidate due to brain mass diagnosed on MRI.  Mild demand ischemia from RVR and hypoxia, EKG nonischemic, not anticoagulation candidate due to brain mass.  Supportive care.  COPD.  Supportive care, on steroids, antibiotics and supplemental oxygen , now intubation.  Troponin rise.  Likely demand ischemia from A-fib RVR, hypoxia and respiratory distress, due to brain mass likely metastatic lung cancer not a candidate for invasive workup avoiding anticoagulation due to brain mass, monitor with supportive care.  She was chest pain-free and EKG was nonacute upon admission.      Condition - Extremely Guarded  Family Communication  : Phone number listed for brother is not working  Code Status : Full code  Consults  : PCCM  PUD Prophylaxis :     Procedures  :           Disposition Plan  :    Status is: Inpatient   DVT Prophylaxis  :    enoxaparin (LOVENOX) injection 30 mg Start: 09/20/23 1500 SCDs Start: 09/19/23 1940   Lab Results  Component Value Date   PLT 189 09/21/2023    Diet :   Diet Order             Diet NPO time specified  Diet effective now                    Inpatient Medications  Scheduled Meds:  budesonide -glycopyrrolate-formoterol   2 puff Inhalation BID   enoxaparin (LOVENOX) injection  30 mg Subcutaneous Q24H   guaiFENesin  20 mL Oral TID   methylPREDNISolone (SOLU-MEDROL) injection  60 mg Intravenous Daily   metoprolol tartrate  12.5 mg Oral BID   valACYclovir  1,000 mg Oral TID   Continuous Infusions:  azithromycin  500 mg (09/20/23 2215)   cefTRIAXone (ROCEPHIN)  IV 1 g (09/20/23 2356)   metronidazole 500 mg (09/21/23 0336)   PRN Meds:.acetaminophen  **OR** acetaminophen , artificial tears, benzonatate, levalbuterol, melatonin, metoprolol tartrate, ondansetron  (ZOFRAN ) IV, tiZANidine    Objective:   Vitals:   09/21/23 0000 09/21/23 0200 09/21/23 0400 09/21/23 0500  BP: 105/60  125/68   Pulse:   92   Resp: 18 (!) 22 18 18   Temp: 98 F (36.7 C)  98 F (36.7 C)   TempSrc: Axillary  Axillary   SpO2: 98%  100%   Weight:    49.9 kg  Height:        Wt Readings from Last 3 Encounters:  09/21/23 49.9 kg  07/26/23 48.2 kg  07/01/23 48.4 kg    No intake or output data in the 24 hours ending 09/21/23 0729   Physical Exam  Minimally awake, gurgling with upper airway respiratory secretions, shallow breathing, appears extremely lethargic, does have facial droop [place].AT,PERRAL Supple Neck, No JVD,   Symmetrical Chest wall movement, Good air movement bilaterally, coarse bibasilar crackles RRR,No Gallops,Rubs or new Murmurs,  +ve B.Sounds, Abd Soft, No tenderness,   No Cyanosis, Clubbing or edema       Data Review:    Recent Labs  Lab 09/19/23 1739 09/20/23 0358 09/20/23 1548 09/21/23 0512  WBC 12.8* 9.5  --  11.8*  HGB 13.0 10.7* 13.9 12.7  HCT 42.8 35.7* 41.0 43.5  PLT 179 130*  --  189  MCV 99.3 97.8  --  100.5*  MCH 30.2 29.3  --  29.3  MCHC 30.4 30.0  --  29.2*  RDW 13.2 13.2  --  13.1  LYMPHSABS 0.2* 0.3*  --    --   MONOABS 0.7 0.4  --   --   EOSABS 0.0 0.0  --   --   BASOSABS 0.0 0.0  --   --     Recent Labs  Lab 09/19/23 1739 09/19/23 1754 09/19/23 2133 09/19/23 2140 09/19/23 2142 09/20/23 0358 09/20/23 0406 09/20/23 1548 09/21/23 0512  NA 144  --   --   --   --  145  --  141 145  K 5.4*  --   --   --   --  3.5  --  4.7 4.3  CL 102  --   --   --   --  101  --   --  105  CO2 29  --   --   --   --  34*  --   --  27  ANIONGAP 13  --   --   --   --  10  --   --  13  GLUCOSE 116*  --   --   --   --  104*  --   --  90  BUN 26*  --   --   --   --  20  --   --  13  CREATININE 0.96  --   --   --   --  0.73  --   --  0.64  AST 48*  --   --   --   --  20  --   --   --   ALT 27  --   --   --   --  15  --   --   --   ALKPHOS 79  --   --   --   --  58  --   --   --   BILITOT 1.9*  --   --   --   --  0.6  --   --   --   ALBUMIN 2.3*  --   --   --   --  2.1*  --   --   --   PROCALCITON  --   --  6.93  --   --   --   --   --   --   LATICACIDVEN  --  2.0*  --  3.1*  --   --  1.3  --   --   TSH  --   --   --   --  1.334  --   --   --   --   BNP 123.0*  --   --   --   --  138.6*  --   --   --   MG 2.1  --   --   --   --  1.6*  --   --   --   PHOS  --   --   --   --   --  3.1  --   --   --   CALCIUM  8.8*  --   --   --   --  8.9  --   --  8.5*      Recent Labs  Lab 09/19/23 1739 09/19/23 1754 09/19/23 2133 09/19/23 2140 09/19/23 2142 09/20/23 0358 09/20/23 0406 09/21/23 0512  PROCALCITON  --   --  6.93  --   --   --   --   --   LATICACIDVEN  --  2.0*  --  3.1*  --   --  1.3  --   TSH  --   --   --   --  1.334  --   --   --   BNP 123.0*  --   --   --   --  138.6*  --   --   MG 2.1  --   --   --   --  1.6*  --   --   CALCIUM  8.8*  --   --   --   --  8.9  --  8.5*    --------------------------------------------------------------------------------------------------------------- Lab Results  Component Value Date   CHOL 179 02/15/2023   HDL 75 02/15/2023   LDLCALC 92 02/15/2023   TRIG  62 02/15/2023   CHOLHDL 2.4 02/15/2023    Lab Results  Component Value Date   HGBA1C 5.7 (H) 07/26/2023   Recent Labs    09/19/23 2142  TSH 1.334   No results for input(s): VITAMINB12, FOLATE, FERRITIN, TIBC, IRON, RETICCTPCT in the last 72 hours. ------------------------------------------------------------------------------------------------------------------ Cardiac Enzymes No results for input(s): CKMB, TROPONINI, MYOGLOBIN in the last 168 hours.  Invalid input(s): CK  Micro Results Recent Results (from the past 240 hours)  Resp panel by RT-PCR (RSV, Flu A&B, Covid) Anterior Nasal Swab     Status: None   Collection Time: 09/19/23  3:44 PM   Specimen: Anterior Nasal Swab  Result Value Ref Range Status   SARS Coronavirus 2 by RT PCR NEGATIVE NEGATIVE Final   Influenza A by PCR NEGATIVE NEGATIVE Final   Influenza B by PCR NEGATIVE NEGATIVE Final    Comment: (NOTE) The Xpert Xpress SARS-CoV-2/FLU/RSV plus assay is intended as an aid in the diagnosis of influenza from Nasopharyngeal swab specimens and should not be used as a sole basis for treatment. Nasal washings and aspirates are unacceptable for Xpert Xpress SARS-CoV-2/FLU/RSV testing.  Fact Sheet for Patients: BloggerCourse.com  Fact Sheet for Healthcare Providers: SeriousBroker.it  This test is not yet approved or cleared by the [place]  FDA and has been authorized for detection and/or diagnosis of SARS-CoV-2 by FDA under an Emergency Use Authorization (EUA). This EUA will remain in effect (meaning this test can be used) for the duration of the COVID-19 declaration under Section 564(b)(1) of the Act, 21 U.S.C. section 360bbb-3(b)(1), unless the authorization is terminated or revoked.     Resp Syncytial Virus by PCR NEGATIVE NEGATIVE Final    Comment: (NOTE) Fact Sheet for Patients: BloggerCourse.com  Fact Sheet  for Healthcare Providers: SeriousBroker.it  This test is not yet approved or cleared by the [place]  FDA and has been authorized for detection and/or diagnosis of SARS-CoV-2 by FDA under an Emergency Use Authorization (EUA). This EUA will remain in effect (meaning this test can be used) for the duration of the COVID-19 declaration under Section 564(b)(1) of the Act, 21 U.S.C. section 360bbb-3(b)(1), unless the authorization is terminated or revoked.  Performed at [facility], [address] [place], [place] [address]   Respiratory (~20 pathogens) panel by PCR     Status: None   Collection Time: 09/19/23  3:44 PM   Specimen: Nasopharyngeal Swab; Respiratory  Result Value Ref Range Status   Adenovirus NOT DETECTED NOT DETECTED Final   Coronavirus 229E NOT DETECTED NOT DETECTED Final    Comment: (NOTE) The Coronavirus on the Respiratory Panel, DOES NOT test for the novel  Coronavirus (2019 nCoV)    Coronavirus HKU1 NOT DETECTED NOT DETECTED Final   Coronavirus NL63 NOT DETECTED NOT DETECTED Final   Coronavirus OC43 NOT DETECTED NOT DETECTED Final   Metapneumovirus NOT DETECTED NOT DETECTED Final   Rhinovirus / Enterovirus NOT DETECTED NOT DETECTED Final   Influenza A NOT DETECTED NOT DETECTED Final   Influenza B NOT DETECTED NOT DETECTED Final   Parainfluenza Virus 1 NOT DETECTED NOT DETECTED Final   Parainfluenza Virus 2 NOT DETECTED NOT DETECTED Final   Parainfluenza Virus 3 NOT DETECTED NOT DETECTED Final   Parainfluenza Virus 4 NOT DETECTED NOT DETECTED Final   Respiratory Syncytial Virus NOT DETECTED NOT DETECTED Final   Bordetella pertussis NOT DETECTED NOT DETECTED Final   Bordetella Parapertussis NOT DETECTED NOT DETECTED Final   Chlamydophila pneumoniae NOT DETECTED NOT DETECTED Final   Mycoplasma pneumoniae NOT DETECTED NOT DETECTED Final    Comment: Performed at [facility], [address], [place], [place] [address]   Culture, blood (routine x 2)     Status: None (Preliminary result)   Collection Time: 09/19/23  5:54 PM   Specimen: BLOOD  Result Value Ref Range Status   Specimen Description BLOOD BLOOD RIGHT ARM  Final   Special Requests   Final    BOTTLES DRAWN AEROBIC ONLY Blood Culture adequate volume   Culture   Final    NO GROWTH < 12 HOURS Performed at [facility], [address] [place], [place] [address]    Report Status PENDING  Incomplete  Culture, blood (routine x 2)     Status: None (Preliminary result)   Collection Time: 09/19/23  5:59 PM   Specimen: BLOOD  Result Value Ref Range Status   Specimen Description BLOOD BLOOD RIGHT HAND  Final   Special Requests   Final    BOTTLES DRAWN AEROBIC ONLY Blood Culture adequate volume   Culture   Final    NO GROWTH < 12 HOURS Performed at [facility], [address] [place], [place] [address]    Report Status PENDING  Incomplete  MRSA Next Gen by PCR, Nasal     Status: None   Collection Time: 09/20/23  3:50 PM   Specimen: Nasal Mucosa; Nasal Swab  Result Value Ref Range Status   MRSA by PCR Next Gen NOT DETECTED NOT DETECTED Final    Comment: (NOTE) The GeneXpert MRSA Assay (FDA approved for NASAL specimens only), is one component of a comprehensive MRSA colonization surveillance program. It is not intended to diagnose MRSA infection nor to guide or monitor treatment for MRSA infections. Test performance is not FDA approved in patients less than 60 years old. Performed at [facility], [address] [place], [place] [address]     Radiology Report DG Chest [place] 1 View Result Date: 09/21/2023 CLINICAL DATA:  63 year old female with lung cancer originally diagnosed in 2021. EXAM: PORTABLE CHEST 1 VIEW COMPARISON:  CTA chest yesterday, portable chests 09/19/2023 and earlier. FINDINGS: Portable AP semi upright view at 0641 hours. Stable lung volumes and mediastinal contours since 09/19/2023. Dense right mid and  lower lung opacification appears mildly progressed since that time, right lung apex remains  aerated. Stable right chest Port-A-Cath. Confluent posterior basal segment left lower lobe collapse or consolidation redemonstrated from CTA yesterday and appears new or increased from 09/19/2023 also. Stable ventilation from the CTA scout view yesterday. No pulmonary edema. Stable paucity of bowel gas in the upper abdomen. Stable visualized osseous structures. IMPRESSION: Progressive right lung, left lung base opacification since 09/19/2023 portable x-ray, but not significantly changed from the CTA yesterday (please see that report). No new cardiopulmonary abnormality. Electronically Signed   By: Marlise Simpers M.D.   On: 09/21/2023 07:17   MR BRAIN W WO CONTRAST Result Date: 09/20/2023 CLINICAL DATA:  history brain mets and radiation, new right sided facial weakness. EXAM: MRI HEAD WITHOUT AND WITH CONTRAST TECHNIQUE: Multiplanar, multiecho pulse sequences of the brain and surrounding structures were obtained without and with intravenous contrast. Postcontrast coronal and sagittal images were not obtained due to patient's inability to complete the study. CONTRAST:  4mL GADAVIST  GADOBUTROL  1 MMOL/ML IV SOLN COMPARISON:  MRI brain 01/05/2023. FINDINGS: Brain: Heterogeneously enhancing mass along the right cerebellopontine angle cistern, measuring up to 36 x 18 mm on axial image 7 series 11, with features suspicious for metastasis. Associated extension into the right internal auditory canal and right hypoglossal canal (axial image 4 series 11). Moderate mass effect on the brainstem and right cerebellar hemisphere. Previously treated supratentorial metastases are markedly decreased in size and number. Minimal residual enhancement associated with the lesion along the left superior frontal gyrus (axial image 33 series 11). Minimal residual enhancement of the lesion along the right superior frontal gyrus (axial image 34 series 11). 3  mm of residual enhancement associated with the previously treated lesion in the right parietal lobe. No acute infarct or hemorrhage. No hydrocephalus, extra-axial collection or midline shift. Vascular: The distal right sigmoid sinus is slightly attenuated by the right cerebellopontine angle cistern mass, but appears to remain patent. Skull and upper cervical spine: Normal marrow signal and enhancement. Sinuses/Orbits: No acute findings. Other: None. IMPRESSION: 1. Heterogeneously enhancing mass along the right cerebellopontine angle cistern, measuring up to 36 x 18 mm, with features suspicious for metastasis. Associated extension into the right internal auditory canal and right hypoglossal canal. Moderate mass effect on the brainstem and right cerebellar hemisphere. No hydrocephalus. 2. Previously treated supratentorial metastases are markedly decreased in size and number. Minimal residual enhancement associated with the lesions along the left superior frontal gyrus, right superior frontal gyrus, and right parietal lobe. Electronically Signed   By: Audra Blend M.D.   On: 09/20/2023 19:15   DG Skull 1-3 Views Result Date: 09/20/2023 CLINICAL DATA:  Concern for foreign body EXAM: SKULL - 1-3 VIEW COMPARISON:  None Available. FINDINGS: There is no evidence of skull fracture or other focal bone lesions. IMPRESSION: No foreign body identified. Electronically Signed   By: Deboraha Fallow M.D.   On: 09/20/2023 17:58   ECHOCARDIOGRAM COMPLETE Result Date: 09/20/2023    ECHOCARDIOGRAM REPORT   Patient Name:   CARESSE SEDIVY Date of Exam: 09/20/2023 Medical Rec #:  [id number]       Height:       65.0 in Accession #:    [id number]      Weight:       95.0 lb Date of Birth:  12-25-1960        BSA:          1.442 m Patient Age:    62 years        BP:  117/74 mmHg Patient Gender: F               HR:           90 bpm. Exam Location:  Inpatient Procedure: 2D Echo, Color Doppler and Cardiac Doppler (Both Spectral  and Color            Flow Doppler were utilized during procedure). Indications:    I48.91* Unspeicified atrial fibrillation  History:        Patient has no prior history of Echocardiogram examinations.  Sonographer:    Andrena Bang Referring Phys: [id number] JUSTIN B HOWERTER IMPRESSIONS  1. Very mild intracavitary gradient. Peak velocity 1.03 m/s. Peak gradient 4.2 mmHg. Left ventricular ejection fraction, by estimation, is 65 to 70%. The left ventricle has normal function. The left ventricle has no regional wall motion abnormalities. There is mild concentric left ventricular hypertrophy. Left ventricular diastolic parameters are consistent with Grade I diastolic dysfunction (impaired relaxation).  2. Right ventricular systolic function is normal. The right ventricular size is normal.  3. A small pericardial effusion is present. There is no evidence of cardiac tamponade.  4. The mitral valve is normal in structure. Trivial mitral valve regurgitation. No evidence of mitral stenosis.  5. The aortic valve is normal in structure. There is mild calcification of the aortic valve. There is mild thickening of the aortic valve. Aortic valve regurgitation is mild. No aortic stenosis is present.  6. Unable to fully assess right atrial pressure. Patient unable to sniff. FINDINGS  Left Ventricle: Very mild intracavitary gradient. Peak velocity 1.03 m/s. Peak gradient 4.2 mmHg. Left ventricular ejection fraction, by estimation, is 65 to 70%. The left ventricle has normal function. The left ventricle has no regional wall motion abnormalities. The left ventricular internal cavity size was normal in size. There is mild concentric left ventricular hypertrophy. Left ventricular diastolic parameters are consistent with Grade I diastolic dysfunction (impaired relaxation). Right Ventricle: The right ventricular size is normal. No increase in right ventricular wall thickness. Right ventricular systolic function is normal. Left Atrium: Left  atrial size was normal in size. Right Atrium: Right atrial size was normal in size. Pericardium: A small pericardial effusion is present. There is excessive respiratory variation in the tricuspid valve spectral Doppler velocities. There is no evidence of cardiac tamponade. Mitral Valve: The mitral valve is normal in structure. Trivial mitral valve regurgitation. No evidence of mitral valve stenosis. Tricuspid Valve: The tricuspid valve is normal in structure. Tricuspid valve regurgitation is trivial. No evidence of tricuspid stenosis. Aortic Valve: The aortic valve is normal in structure. There is mild calcification of the aortic valve. There is mild thickening of the aortic valve. Aortic valve regurgitation is mild. No aortic stenosis is present. Aortic valve mean gradient measures 3.0 mmHg. Aortic valve peak gradient measures 7.3 mmHg. Aortic valve area, by VTI measures 2.24 cm. Pulmonic Valve: The pulmonic valve was normal in structure. Pulmonic valve regurgitation is not visualized. No evidence of pulmonic stenosis. Aorta: The aortic root is normal in size and structure. Venous: Unable to fully assess right atrial pressure. Patient unable to sniff. IAS/Shunts: No atrial level shunt detected by color flow Doppler.  LEFT VENTRICLE PLAX 2D LVIDd:         3.00 cm     Diastology LVIDs:         2.20 cm     LV e' medial:    7.93 cm/s LV PW:         1.20 cm  LV E/e' medial:  9.3 LV IVS:        1.20 cm     LV e' lateral:   13.80 cm/s LVOT diam:     1.90 cm     LV E/e' lateral: 5.3 LV SV:         57 LV SV Index:   39 LVOT Area:     2.84 cm  LV Volumes (MOD) LV vol d, MOD A2C: 61.2 ml LV vol d, MOD A4C: 60.3 ml LV vol s, MOD A2C: 16.7 ml LV vol s, MOD A4C: 17.1 ml LV SV MOD A2C:     44.5 ml LV SV MOD A4C:     60.3 ml LV SV MOD BP:      44.4 ml RIGHT VENTRICLE RV S prime:     22.70 cm/s TAPSE (M-mode): 1.6 cm LEFT ATRIUM           Index LA diam:      2.70 cm 1.87 cm/m LA Vol (A2C): 26.1 ml 18.10 ml/m LA Vol (A4C):  28.6 ml 19.84 ml/m  AORTIC VALVE AV Area (Vmax):    2.54 cm AV Area (Vmean):   2.66 cm AV Area (VTI):     2.24 cm AV Vmax:           135.00 cm/s AV Vmean:          74.400 cm/s AV VTI:            0.253 m AV Peak Grad:      7.3 mmHg AV Mean Grad:      3.0 mmHg LVOT Vmax:         121.00 cm/s LVOT Vmean:        69.700 cm/s LVOT VTI:          0.200 m LVOT/AV VTI ratio: 0.79  AORTA Ao Asc diam: 2.70 cm MITRAL VALVE MV Area (PHT): 4.17 cm    SHUNTS MV Decel Time: 182 msec    Systemic VTI:  0.20 m MV E velocity: 73.50 cm/s  Systemic Diam: 1.90 cm MV A velocity: 69.80 cm/s MV E/A ratio:  1.05 Maudine Sos MD Electronically signed by Maudine Sos MD Signature Date/Time: 09/20/2023/11:09:34 AM    Final    CT Angio Chest Pulmonary Embolism (PE) W or WO Contrast Result Date: 09/20/2023 CLINICAL DATA:  63 year old female with shortness of breath. Lung cancer originally diagnosed in 2021. * Tracking Code: BO * EXAM: CT ANGIOGRAPHY CHEST WITH CONTRAST TECHNIQUE: Multidetector CT imaging of the chest was performed using the standard protocol during bolus administration of intravenous contrast. Multiplanar CT image reconstructions and MIPs were obtained to evaluate the vascular anatomy. RADIATION DOSE REDUCTION: This exam was performed according to the departmental dose-optimization program which includes automated exposure control, adjustment of the mA and/or kV according to patient size and/or use of iterative reconstruction technique. CONTRAST:  75mL OMNIPAQUE  IOHEXOL  350 MG/ML SOLN COMPARISON:  Restaging chest CT 05/19/2022 and earlier. FINDINGS: Cardiovascular: Good contrast bolus timing in the pulmonary arterial tree. Extensive enhancing chest wall and paravertebral venous collaterals. At apparent chronic partial right lower lobe pneumonectomy. Right lower lobe pulmonary artery branches appear to be chronically absent. Tapered appearance of right upper lobe branches. But no pulmonary artery filling defect  identified bilaterally. Right chest Port-A-Cath is chronic. Stable thoracic aorta. Heart size remains normal. No pericardial effusion. Mediastinum/Nodes: No convincing mediastinal mass or lymphadenopathy. Lungs/Pleura: Abnormal right lung. Chronic perihilar post radiation changes demonstrated last year with perihilar bronchiectasis. Progressive surrounding lung  consolidation since that time, and new opacification of the right bronchus intermedius, abruptly and just distal to the right mainstem bronchus on series 6, image 59. Confluent opacity throughout the residual right lower and middle lobes, with air bronchograms. Bronchiectasis. Difficult to exclude early cavitation. And no enhancement of the lung parenchyma there. Superimposed adjacent right upper lobe peribronchial inflammatory appearing opacity, series 6, image 67. Contralateral left lung with centrilobular emphysema. Left upper lobe and lingula are stable from last year but there is new left lower lobe posterior basal segment confluent peribronchial opacity with air bronchograms, but also partially enhancing lung parenchyma there. Left lung airways remain patent. Superimposed small partially sub pulmonic right pleural effusion, simple fluid density. Upper Abdomen: Grossly stable and negative visible early contrast appearance of the upper abdominal viscera. Musculoskeletal: Enhancing chest wall and paravertebral venous collaterals. No acute or suspicious osseous lesion is identified. Review of the MIP images confirms the above findings. IMPRESSION: 1. Negative for acute pulmonary embolus. 2. Positive for chronic post treatment changes to the right lung, including chronic radiation pneumonitis. But superimposed newly opacified bronchus intermedius and associated Severe New Right middle and lower lobe Consolidation. Large volume Aspiration or Necrotizing Pneumonia are not excluded. Small superimposed right pleural effusion. Early infectious involvement of the  adjacent right upper lobe. And contralateral segmental consolidation in the Left Lower Lobe. 3.  Emphysema (ICD10-J43.9). Electronically Signed   By: Marlise Simpers M.D.   On: 09/20/2023 06:36   DG Chest Portable 1 View Result Date: 09/19/2023 CLINICAL DATA:  Short of breath, history of metastatic non-small cell lung cancer EXAM: PORTABLE CHEST 1 VIEW COMPARISON:  03/25/2021, 05/19/2022 FINDINGS: Single frontal view of the chest demonstrates right chest wall port tip overlying atriocaval junction. External defibrillator pads overlie the chest. Cardiac silhouette is unremarkable. There is prominence of the right hilum again noted, with new right basilar consolidation and right pleural effusion. Chronic elevation of the right hemidiaphragm. No pneumothorax. Left chest is clear. No acute bony abnormalities. IMPRESSION: 1. Right basilar consolidation and right pleural effusion, new since prior studies. I would favor acute pneumonia or aspiration, though close follow-up after treatment is recommended to exclude recurrent malignancy. 2. Stable right hilar prominence, consistent with post therapeutic changes and bronchiectasis seen on prior CT. Electronically Signed   By: Bobbye Burrow M.D.   On: 09/19/2023 17:31     Signature  -   Lynnwood Sauer M.D on 09/21/2023 at 7:29 AM   -  To page go to [email or web address]    Total Critical Care time in examining the patient bedside, evaluating Lab work and other data, over half of the total time was spent in coordinating patient care on the floor or bedisde in talking to patient/family members, communicating with nursing Staff on the floor and sub specialists PCCM, ENT to coordinate patients medical care and needs is  40 Minutes.   The condition which has caused critical injury/acute impairment of respiratory vital organ system with a high probability of sudden clinically significant deterioration and can cause Potential Life threatening injury to this patient addressed today is acute  hypoxic and hypercapnic respiratory failure, failure to protect airway, requiring intubation.

## 2023-09-21 NOTE — Progress Notes (Signed)
 PT Cancellation Note  Patient Details Name: Elizabeth Mayer MRN: [id number] DOB: 1960-08-21   Cancelled Treatment:    Reason Eval/Treat Not Completed: Medical issues which prohibited therapy  Noted decline in respiratory status and transfer to ICU this morning. Will hold formal physical therapy evaluation at this time. Please secure chat myself or [facility] PT if urgent evaluation needed.  Jory Ng, PT, DPT [facility]  Rehabilitation Services Physical Therapist Office: [phone] Website: [email or web address]   Alinda Irani 09/21/2023, 8:20 AM

## 2023-09-21 NOTE — Consult Note (Signed)
 NAME:  Elizabeth Mayer, MRN:  [id number], DOB:  March 14, 1961, LOS: 2 ADMISSION DATE:  09/19/2023, CONSULTATION DATE: 09/21/2023 REFERRING MD: [facility], CHIEF COMPLAINT: Acute on chronic respiratory distress  History of Present Illness:  63 year old female with known non-small cell lung cancer with metastases to the brain.  Admitted with decreased level of consciousness respiratory distress thought appears to be right-sided pneumonia.  She became more lethargic on 09/21/2023 critical care was called to the bedside for possible intubation.  She was transferred to the intensive care unit for further evaluation and treatment and due to her plethora of health issues noninvasive mechanical ventilatory support will be instituted.  Pertinent  Medical History   Past Medical History:  Diagnosis Date   Anemia    Angina 1982   related to stress   Asthma    in the past,  no current problems   Atrial fibrillation (HCC)    Complication of anesthesia    states anesthesia made her hair fall out, old meds. hard to awaken 1 time she took Flexeril  before.   Constipation    COPD (chronic obstructive pulmonary disease) (HCC)    Dyspnea    on oxygen  at home - 3L via [place]   Dysrhythmia    Fall 06/01/2023   Fatigue    GERD (gastroesophageal reflux disease)    patient denies this dx   Heart murmur 1970s   no problems currently   Hypertension    Ingrown toenail    Lung cancer (HCC) 10/2019   metastatic disease to the brain   On home oxygen  therapy    2L via [place] - 24 hours a day   Past heart attack 1980-1981   pt states she passed out and woke up in hospital- told she had heart attack, but then dr said he couldn't find anything wrong.   Peripheral vascular disease (HCC)    Pneumonia    x 1     Significant Hospital Events: Including procedures, antibiotic start and stop dates in addition to other pertinent events     Interim History / Subjective:  63 year old with acute on chronic respiratory  failure  Objective    Blood pressure 125/68, pulse 92, temperature 98 F (36.7 C), temperature source Axillary, resp. rate 18, height 5' 5 (1.651 m), weight 49.9 kg, SpO2 100%.       No intake or output data in the 24 hours ending 09/21/23 0814 Filed Weights   09/20/23 1630 09/20/23 1940 09/21/23 0500  Weight: 49.9 kg 49.9 kg 49.9 kg    Examination: General: Cachectic female who will answer questions HENT: No JVD is appreciated Lungs: Minich breath sounds on the right Cardiovascular: Heart sounds are distant, Port-A-Cath in place Abdomen: Soft nontender Extremities: Wasted Neuro: Lethargic but does answer questions GU: Pure wick in place  Resolved problem list   Assessment and Plan  Acute on chronic respiratory failure in the setting of stage IV lung cancer with metastasis to the brain in March right pneumonia.  Pulmonary critical care called to bedside unable to reach any family to confirm CODE STATUS.  Therefore Noninvasive mechanical ventilatory support Continue to try to reach family for CODE STATUS Continue antimicrobial therapy Serial ABGs Note reported to have paralyzed vocal cords therefore if intubated will require tracheostomy History of non-small cell lung cancer with metastases to the brain  History of COPD Bronchodilators   Best Practice (right click and Reselect all SmartList Selections daily)   Diet/type: NPO DVT prophylaxis prophylactic heparin   Pressure  ulcer(s): N/A GI prophylaxis: PPI Lines: Central line Foley:  Yes, and it is still needed Code Status:  full code Last date of multidisciplinary goals of care discussion [tbd]  Labs   CBC: Recent Labs  Lab 09/19/23 1739 09/20/23 0358 09/20/23 1548 09/21/23 0512  WBC 12.8* 9.5  --  11.8*  NEUTROABS 11.7* 8.8*  --   --   HGB 13.0 10.7* 13.9 12.7  HCT 42.8 35.7* 41.0 43.5  MCV 99.3 97.8  --  100.5*  PLT 179 130*  --  189    Basic Metabolic Panel: Recent Labs  Lab 09/19/23 1739  09/20/23 0358 09/20/23 1548 09/21/23 0512  NA 144 145 141 145  K 5.4* 3.5 4.7 4.3  CL 102 101  --  105  CO2 29 34*  --  27  GLUCOSE 116* 104*  --  90  BUN 26* 20  --  13  CREATININE 0.96 0.73  --  0.64  CALCIUM  8.8* 8.9  --  8.5*  MG 2.1 1.6*  --   --   PHOS  --  3.1  --   --    GFR: Estimated Creatinine Clearance: 56 mL/min (by C-G formula based on SCr of 0.64 mg/dL). Recent Labs  Lab 09/19/23 1739 09/19/23 1754 09/19/23 2133 09/19/23 2140 09/20/23 0358 09/20/23 0406 09/21/23 0512  PROCALCITON  --   --  6.93  --   --   --   --   WBC 12.8*  --   --   --  9.5  --  11.8*  LATICACIDVEN  --  2.0*  --  3.1*  --  1.3  --     Liver Function Tests: Recent Labs  Lab 09/19/23 1739 09/20/23 0358  AST 48* 20  ALT 27 15  ALKPHOS 79 58  BILITOT 1.9* 0.6  PROT 6.3* 5.8*  ALBUMIN 2.3* 2.1*   No results for input(s): LIPASE, AMYLASE in the last 168 hours. No results for input(s): AMMONIA in the last 168 hours.  ABG    Component Value Date/Time   PHART 7.32 (L) 09/21/2023 0643   PCO2ART 81 (HH) 09/21/2023 0643   PO2ART 229 (H) 09/21/2023 0643   HCO3 42.2 (H) 09/21/2023 0643   TCO2 33 (H) 09/20/2023 1548   O2SAT 100 09/21/2023 0643     Coagulation Profile: No results for input(s): INR, PROTIME in the last 168 hours.  Cardiac Enzymes: No results for input(s): CKTOTAL, CKMB, CKMBINDEX, TROPONINI in the last 168 hours.  HbA1C: Hgb A1c MFr Bld  Date/Time Value Ref Range Status  07/26/2023 12:54 PM 5.7 (H) 4.8 - 5.6 % Final    Comment:             Prediabetes: 5.7 - 6.4          Diabetes: >6.4          Glycemic control for adults with diabetes: <7.0   02/15/2023 02:18 PM 5.8 (H) 4.8 - 5.6 % Final    Comment:             Prediabetes: 5.7 - 6.4          Diabetes: >6.4          Glycemic control for adults with diabetes: <7.0     CBG: No results for input(s): GLUCAP in the last 168 hours.  Review of Systems:   na  Past Medical History:   She,  has a past medical history of Anemia, Angina (1982), Asthma, Atrial fibrillation (HCC), Complication  of anesthesia, Constipation, COPD (chronic obstructive pulmonary disease) (HCC), Dyspnea, Dysrhythmia, Fall (06/01/2023), Fatigue, GERD (gastroesophageal reflux disease), Heart murmur (1970s), Hypertension, Ingrown toenail, Lung cancer (HCC) (10/2019), On home oxygen  therapy, Past heart attack ([id number]), Peripheral vascular disease (HCC), and Pneumonia.   Surgical History:   Past Surgical History:  Procedure Laterality Date   CERVICAL DISC SURGERY  2000   Disc removed from neck    ingrown toe nail surgery Bilateral    IR IMAGING GUIDED PORT INSERTION  02/16/2020   MULTIPLE TOOTH EXTRACTIONS     for braces   RADIOLOGY WITH ANESTHESIA N/A 12/05/2019   Procedure: MRI BRAIN WITH AND WITHOUT CONTRAST;  Surgeon: Radiologist, Medication, MD;  Location: [facility] OR;  Service: Radiology;  Laterality: N/A;   RADIOLOGY WITH ANESTHESIA N/A 01/02/2020   Procedure: MRI BRAIN WITH AND WITHOUT CONTRAST;  Surgeon: Radiologist, Medication, MD;  Location: [facility] OR;  Service: Radiology;  Laterality: N/A;   RADIOLOGY WITH ANESTHESIA N/A 02/20/2020   Procedure: MRI WITH ANESTHESIA OF BRAIN WITH AND WITHOUT CONTRAST;  Surgeon: Radiologist, Medication, MD;  Location: [facility] OR;  Service: Radiology;  Laterality: N/A;   RADIOLOGY WITH ANESTHESIA N/A 06/11/2020   Procedure: MRI WITH ANESTHESIA OF BRAIN WITH AND WITHOUT CONTRAST;  Surgeon: Radiologist, Medication, MD;  Location: [facility] OR;  Service: Radiology;  Laterality: N/A;   RADIOLOGY WITH ANESTHESIA N/A 10/10/2020   Procedure: MRI WITH ANESTHESIA BRAIN WITH AND WITHOUT CONTRAST;  Surgeon: Radiologist, Medication, MD;  Location: [facility] OR;  Service: Radiology;  Laterality: N/A;   RADIOLOGY WITH ANESTHESIA N/A 01/30/2021   Procedure: MRI BRAIN WITH AND WITHOUT CONTRASTWITH ANESTHESIA;  Surgeon: Radiologist, Medication, MD;  Location: [facility];  Service: Radiology;  Laterality: N/A;    RADIOLOGY WITH ANESTHESIA N/A 06/12/2021   Procedure: MRI WITH ANESTHESIA OF BRAIN WITH AND WITHOUT CONTRAST;  Surgeon: Radiologist, Medication, MD;  Location: [facility];  Service: Radiology;  Laterality: N/A;   RADIOLOGY WITH ANESTHESIA N/A 09/11/2021   Procedure: MRI OF BRAIN WITH AND WITHOUT CONTRAST WITH ANESTHESIA;  Surgeon: Radiologist, Medication, MD;  Location: [facility];  Service: Radiology;  Laterality: N/A;   RADIOLOGY WITH ANESTHESIA N/A 01/15/2022   Procedure: MRI BRAIN WITH AND WITHOUT CONTRAST WITH ANESTHESIA;  Surgeon: Radiologist, Medication, MD;  Location: [facility];  Service: Radiology;  Laterality: N/A;   RADIOLOGY WITH ANESTHESIA N/A 05/14/2022   Procedure: MRI WITH ANESTHESIA BRAIN WITH AND WITHOUT CONTRAST;  Surgeon: Radiologist, Medication, MD;  Location: [facility];  Service: Radiology;  Laterality: N/A;   TONSILLECTOMY       Social History:   reports that she quit smoking about 24 years ago. Her smoking use included cigarettes. She started smoking about 45 years ago. She has a 21 pack-year smoking history. She has never used smokeless tobacco. She reports that she does not drink alcohol and does not use drugs.   Family History:  Her family history includes Arthritis in her mother; Heart failure in her father.   Allergies Allergies  Allergen Reactions   Sudafed [Pseudoephedrine] Hypertension   Amlodipine  Swelling   Atorvastatin  Other (See Comments)    Cramping   Gabapentin Other (See Comments)    Raise blood pressure and red rings around eyes Blood vessels popped in her eyes   Mobic  [Meloxicam ] Swelling    Inflamed the area that has inflammation and stabbing pains in the area   Penicillins Hives     Childhood reaction   Prednisone      Caused A-fib     Home Medications  Prior to Admission medications   Medication Sig Start Date End Date Taking? Authorizing Provider  acetaminophen  (TYLENOL ) 500 MG tablet Take 1,000 mg by mouth every 6 (six) hours as needed for moderate  pain.   Yes [provider]  ALPRAZolam  (XANAX ) 1 MG tablet Take 1 tablet (1 mg total) by mouth once as needed for up to 1 dose (mri claustrophobia). Take 45 minutes before MRI study 07/28/22  Yes Vaslow, Zachary K, MD  azelastine (ASTELIN) 0.1 % nasal spray Place 2 sprays into both nostrils 2 (two) times daily. Use in each nostril as directed   Yes [provider]  benzoyl peroxide (CERAVE ACNE FOAMING CREAM) 4 % external liquid Apply 1 application topically 2 (two) times daily. Face   Yes [provider]  BREYNA  160-4.5 MCG/ACT inhaler Inhale 2 puffs into the lungs 2 (two) times daily. 07/11/23  Yes [provider]  camphor-menthol Eyvonne Hollering) lotion Apply 1 Application topically as needed for itching.   Yes [provider]  carboxymethylcellulose (REFRESH PLUS) 0.5 % SOLN Place 2 drops into both eyes daily as needed (dry eyes). 08/26/23  Yes [provider]  cetaphil (CETAPHIL) lotion Apply 1 application  topically 2 (two) times daily.   Yes [provider]  clindamycin  (CLEOCIN  T) 1 % external solution Apply 1 application topically 2 (two) times daily. 09/20/20  Yes [provider]  erythromycin ophthalmic ointment Place 1 Application into the right eye at bedtime.   Yes [provider]  fexofenadine (ALLEGRA) 180 MG tablet Take 180 mg by mouth in the morning.   Yes [provider]  ipratropium (ATROVENT) 0.03 % nasal spray Place 2 sprays into both nostrils 3 (three) times daily.   Yes [provider]  magic mouthwash SOLN Take 5 mLs by mouth 3 (three) times daily. Suspension contains equal amounts of Maalox Extra Strength, nystatin, and diphenhydramine. 07/26/23  Yes [name], FNP  Multiple Vitamins-Minerals (ALIVE MULTI-VITAMIN PO) Take 1 tablet by mouth daily.   Yes [provider]  triamcinolone ointment (KENALOG) 0.1 % Apply 1 application  topically at bedtime. 05/14/20  Yes [provider]  VENTOLIN  HFA 108 (90 Base) MCG/ACT inhaler INHALE 1 TO 2 PUFFS BY MOUTH EVERY 6 HOURS AS NEEDED FOR WHEEZING FOR SHORTNESS OF BREATH Patient taking differently: Inhale 2 puffs into the lungs every 6 (six) hours as needed for wheezing or shortness of breath. 09/16/22  Yes Denson Flake, MD  budeson-glycopyrrolate-formoterol  (BREZTRI  AEROSPHERE) 160-9-4.8 MCG/ACT AERO Inhale 2 puffs into the lungs in the morning and at bedtime. Patient not taking: Reported on 09/19/2023 07/01/23   Denson Flake, MD     Critical care time: 34 min      Siegfried Dress Natsumi Whitsitt ACNP Acute Care Nurse Practitioner [facility] Pulmonary/Critical Care Please consult Amion 09/21/2023, 8:14 AM

## 2023-09-21 NOTE — Plan of Care (Signed)
 Pt has rested quietly throughout the night with no distress noted. Is alert at times. Verbalizes a little but very hard to understand what pt is saying. On O2 4LNC. SR on the monitor. Purewick on to suction but pt incontinent and had linens changed. No complaints voiced.     Problem: Clinical Measurements: Goal: Respiratory complications will improve Outcome: Progressing Goal: Cardiovascular complication will be avoided Outcome: Progressing   Problem: Activity: Goal: Risk for activity intolerance will decrease Outcome: Progressing   Problem: Coping: Goal: Level of anxiety will decrease Outcome: Progressing   Problem: Pain Managment: Goal: General experience of comfort will improve and/or be controlled Outcome: Progressing

## 2023-09-21 NOTE — Progress Notes (Signed)
 ABG drawn at 1015.  Results are as follows: pH 7.36  pCO2 70  pO2 85  HCO3-  40  o2 sat 96%

## 2023-09-21 NOTE — TOC CM/SW Note (Signed)
 Transition of Care [facility]) - Inpatient Brief Assessment   Patient Details  Name: Elizabeth Mayer MRN: [id number] Date of Birth: [birth year]  Transition of Care [facility]) CM/SW Contact:    Tom-Johnson, Angelique Ken, RN Phone Number: 09/21/2023, 1:01 PM   Clinical Narrative:  Patient presented to the ED with Shortness of Breath, admitted with Sepsis 2/2 CAP and Altered Mental Status. On IV abx. Patient has hx of Chronic Hypoxic Respiratory failure, on 2L O2 at home, Non-Small Cell Lung Cancer with Mets to the Brain,COPD, Paroxysmal A-Fib, not on anticoagulants. Patient was followed by Outpatient Oncology at [facility] and has not been seen since 2023. Patient is currently on BIPAP. MRI of the Brain showed new progressing Brain Mass. Palliative consulted for GOC.   Contact on file not reachable, LCSW following.   Patient is too lethargic  with AMS to assess at this time. Patient not Medically ready for discharge.  CM will continue to follow as patient progresses with care towards discharge.               Transition of Care Asessment:

## 2023-09-21 NOTE — IPAL (Addendum)
  Interdisciplinary Goals of Care Family Meeting   Date carried out:: 09/21/2023  Location of the meeting: Bedside  Member's involved: Physician and Bedside Registered Nurse  Durable Power of Attorney or acting medical decision maker:   Patient herself   Discussion:  Mental status improved to the point where she was able to come off BiPAP and have a conversation.  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  Patient was unaware that her cancer has come back.  I have discussed these findings with her outpatient oncology team Dr. Josphine Nims and [name].  The new lesion is treatable by radiotherapy if she can recover to discharge  We are currently treating her for severe right lower lobe pneumonia.  She will likely need tracheostomy and PEG tube for vocal cord paralysis and severe dysphagia, severe malnutrition if she were to recover to be able to get radiotherapy.  All this was discussed with the patient and code status discussed.  Patient wants to think some more before deciding on goals of care.  She is also trying to reach her brother to help with this.  She remains full code for now.  Code status: Full Code  Disposition: Continue current acute care   Time spent for the meeting: 35 mins  Elizabeth Mayer 09/21/2023, 5:14 PM

## 2023-09-21 NOTE — Procedures (Signed)
 Bedside ultrasound procedure   Bedside ultrasound performed on the right lung to evaluate pleural fluid and need for thoracentesis Patient positioned appropriately and ultrasound performed which showed a small fluid pocket that cannot be drained safely. Will continue to monitor.      Laakea Pereira MD [facility] See Amion for pager  If no response to pager , please call [phone] until 7pm After 7:00 pm call Elink  [phone] 09/21/2023, 9:47 AM

## 2023-09-21 NOTE — Progress Notes (Signed)
 SLP Cancellation Note  Patient Details Name: Elizabeth Mayer MRN: [id number] DOB: 07-19-1960   Cancelled treatment:       Reason Eval/Treat Not Completed: Medical issues which prohibited therapy. Will hold SLP interventions at this time; pt now in ICU with potential plan for trach. MRI shows Mass on cerebellopontine angle cistern, measuring up to 36 x 18 mm on axial image 7 series 11, with features suspicious for metastasis. Associated extension into the right internal auditory canal and right hypoglossal canal (axial image 4 series 11). Moderate mass effect on the brainstem and right cerebellar hemisphere. This explains severe dysphagia that seems to have worsened over the last month. Will follow chart for needs when appropriate   Janise Gora, Hardin Leys 09/21/2023, 8:23 AM

## 2023-09-21 NOTE — Progress Notes (Signed)
 ABG obtained per physician order. Lab called and notified of specimen being sent down. RT will continue to be available as needed.

## 2023-09-21 NOTE — Progress Notes (Addendum)
 eLink Physician-Brief Progress Note Patient Name: Elizabeth Mayer DOB: 09/03/1960 MRN: [id number]   Date of Service  09/21/2023  HPI/Events of Note  63 year old with a history of non-small cell lung cancer with recurrent brain metastasis complicated by vocal cord paralysis that presents with likely aspiration pneumonia and altered mentation.  eICU Interventions  Discussed updates with the family at bedside, no changes to the plan in the at this time.   0645 - potassium 3.4, mag 1.7, cret 0.72, GFR >60; Kcl and Mag  Intervention Category Minor Interventions: Communication with other healthcare providers and/or family  Stacy Eagle 09/21/2023, 8:05 PM

## 2023-09-21 NOTE — Progress Notes (Signed)
 Patient brought to ICU by rapid response with shortness of breath. Medical team instructed to prepare for RSI and mediations prepared. IV access limited and by the time the MD arrived to the floor, a decision made to trial Bipap instead of intubation.   Versed  2mg  wasted in med-room steri-bin with RN. All other meds returned to pyxis.  Mohammed Andrew, PharmD Clinical Pharmacist 09/21/2023 9:01 AM Please check AMION for all [facility] numbers

## 2023-09-21 NOTE — Consult Note (Signed)
 Consultation Note Date: 09/21/2023   Patient Name: Elizabeth Mayer  DOB: 03/22/1961  MRN: [id number]  Age / Sex: 63 y.o., female  PCP: [name], FNP Referring Physician: Mannam, Praveen, MD  Reason for Consultation:  lung ca, brain mets, goc  HPI/Patient Profile: 63 y.o. female  with past medical history of COPD, afib- not on anticoag, chronic resp failure on 2-3 L oxygen  at home, NSCLC- s/p chemotherapy and SRS to brain- she has not had treatment since August 2023 and lung and brain disease has been stable on surveillance- she elected to defer any followup with medical oncology [name] since 2023, and has not seen neurooncology [name] since October 2024 - at which time there was mixed findings on brain MRI with two lesions slightly progressed and she expressed she did not want any further CNS directed treatments admitted on 09/19/2023 with shortness of breath. Workup reveals sepsis due to aspiration pneumonia. She is currently in ICU on bipap. MRI of brain showed new progressing brain mass. Palliative consulted for GOC.    Primary Decision Maker OTHER - patient currently confused- unable to contact listed emergency contact  Discussion: Chart reviewed including labs, progress notes, imaging from this and previous encounters.   I completed indepth chart review-  Noted she was seen by otolayrnology in late May this year and diagnosed with vocal chord paralysis. Per attending note from this admission- she was planning for elective tracheostomy.  She had a CT neck showing ill defined soft tissue mass in the mediastinum and concerns for sclerotic metastasis in her cervical and thoracic spine.  She was seen by [facility] in 2023 and their note documents that she was not tolerating treatment well and had wanted holiday from treatments. There was no further f/u at [facility].  CT angio this admission reviewed with  complete consolidation of R middle and lower lobe. Concern for aspiration or necrotizing pneumonia.  SLP note reviewed- patient with severe dysphagia unclear how pt has been able to eat or drink at all. NPO status and cortrak recommended.   On eval patient was sleeping on bipap. She did not awake to my voice or light touch. Discussed with RN and Rice Chamorro Gleason, NP- when awake she is confused and lethargic. Unable to participate in GOC discussion. Listed emergency contact is her brother and this number is not in service.   SUMMARY OF RECOMMENDATIONS -Unable to complete GOC due pt mental status and no reachable NOK -I've placed TOC consult for due dilligence search for [facility] and referral to APS if no contacts can be found -Please notify Palliative team when [facility] is located or patient's mental status has improved to point where she can participate in GOC discussion    Code Status/Advance Care Planning:   Code Status: Full Code    Prognosis:   Unable to determine  Discharge Planning: To Be Determined  Primary Diagnoses: Present on Admission:  CAP (community acquired pneumonia)  Severe sepsis (HCC)  Leukocytosis  SOB (shortness of breath)  Atrial fibrillation with  RVR (HCC)  Elevated troponin  Acute on chronic hypoxic respiratory failure (HCC)  Non-small cell lung cancer (HCC)  Essential hypertension   Review of Systems  Physical Exam Vitals and nursing note reviewed.  Constitutional:      Comments: Frail, cachectic  Pulmonary:     Comments: On bipap  Neurological:     Comments: Confused per RN report- sleeping on my eval    Vital Signs: BP 94/65   Pulse 84   Temp 98 F (36.7 C) (Axillary)   Resp (!) 22   Ht 5' 5 (1.651 m)   Wt 49.9 kg   LMP  (LMP Unknown)   SpO2 100%   BMI 18.31 kg/m  Pain Scale: 0-10   Pain Score: 4    SpO2: SpO2: 100 % O2 Device:SpO2: 100 % O2 Flow Rate: .O2 Flow Rate (L/min): 3.5 L/min  IO: Intake/output summary:  Intake/Output Summary  (Last 24 hours) at 09/21/2023 1307 Last data filed at 09/21/2023 0658 Gross per 24 hour  Intake 641.77 ml  Output --  Net 641.77 ml    LBM:   Baseline Weight: Weight: 43.1 kg Most recent weight: Weight: 49.9 kg       Thank you for this consult. Palliative medicine will continue to follow and assist as needed.  Time Total: 60 minutes Signed by: Micki Alas, AGNP-C Palliative Medicine  Time includes:   Preparing to see the patient (e.g., review of tests) Obtaining and/or reviewing separately obtained history Performing a medically necessary appropriate examination and/or evaluation Counseling and educating the patient/family/caregiver Ordering medications, tests, or procedures Referring and communicating with other health care professionals (when not reported separately) Documenting clinical information in the electronic or other health record Independently interpreting results (not reported separately) and communicating results to the patient/family/caregiver Care coordination (not reported separately) Clinical documentation   Please contact Palliative Medicine Team phone at [phone] for questions and concerns.  For individual provider: See Tilford Foley

## 2023-09-21 NOTE — Progress Notes (Signed)
 Initial Nutrition Assessment  DOCUMENTATION CODES:  Severe malnutrition in context of chronic illness, Underweight  INTERVENTION:  Plans for cortrak placement tomorrow.   Once enteral access obtained, recommend: Start Osmolite 1.5 at 15ml/hr and advance by 10ml q12h to goal rate of 46ml/h (1080 ml per day) Prosource TF20 60 ml once daily Provides 1700 kcal, 68 gm protein, 823 ml free water daily  Start thiamine 100mg  daily x5 days  Monitor magnesium and phosphorus daily x 4 occurrences, MD to replete as needed, as pt is at risk for refeeding syndrome given severe malnutrition.  MVI with minerals daily  NUTRITION DIAGNOSIS:  Severe Malnutrition related to cancer and cancer related treatments, chronic illness as evidenced by severe fat depletion, severe muscle depletion, percent weight loss.  GOAL:  Patient will meet greater than or equal to 90% of their needs  MONITOR:  Labs, Weight trends  REASON FOR ASSESSMENT:  Consult Enteral/tube feeding initiation and management  ASSESSMENT:  Pt admitted with severe sepsis due to CAP. PMH significant for COPD, paroxysmal afib, chronic hypoxic respiratory failure, non-small cell lung cancer with mets to the brain.  Pt on bipap at time of visit. No family/visitors present. Unable to obtain detailed nutrition related history.   Per Palliative evaluation, unable to complete GOC d/t pt mental status and no reachable next of kin.   Pt with vocal cord paralysis. Noted to have dysphagia. SLP performed bedside swallow evaluation on 6/16. Recommended NPO and Cortrak placement with uncertainty on how pt has been able to take any oral intake PTA given inability to siphon from a straw without total assist.   Pt discussed in interdisciplinary rounds.  Plans for cortrak placement for nutrition until GOC able to be established.   Weight history reviewed.  Pt's weight has been declining since July 2023. More significantly, within the last 9 months,  she appears to have had a weight loss of 19% which is clinically significant for time frame.   Medications: solu-medrol, IV abx (zithromax , rocephin, flagyl)  Labs: reviewed CBG's 86, 90 x6 hours  NUTRITION - FOCUSED PHYSICAL EXAM:  Flowsheet Row Most Recent Value  Orbital Region Severe depletion  Upper Arm Region Severe depletion  Thoracic and Lumbar Region Severe depletion  Buccal Region Unable to assess  Temple Region Moderate depletion  Clavicle Bone Region Severe depletion  Clavicle and Acromion Bone Region Severe depletion  Scapular Bone Region Severe depletion  Dorsal Hand Severe depletion  Patellar Region Severe depletion  Anterior Thigh Region Severe depletion  Posterior Calf Region Severe depletion  Edema (RD Assessment) None  Hair Reviewed  Eyes Unable to assess  Mouth Unable to assess  Skin Reviewed  Nails Unable to assess    Diet Order:   Diet Order             Diet NPO time specified  Diet effective now                   EDUCATION NEEDS:   No education needs have been identified at this time  Skin:  Skin Assessment: Reviewed RN Assessment  Last BM:  unknown/PTA  Height:   Ht Readings from Last 1 Encounters:  09/20/23 5' 5 (1.651 m)    Weight:   Wt Readings from Last 1 Encounters:  09/21/23 49.9 kg   BMI:  Body mass index is 18.31 kg/m.  Estimated Nutritional Needs:   Kcal:  1700-1900  Protein:  80-95g  Fluid:  >/=1.5L  Rocklin Chute, RDN, LDN Clinical Nutrition  See AMiON for contact information.

## 2023-09-21 NOTE — Progress Notes (Addendum)
 Attending note: I have seen and examined the patient. History, labs and imaging reviewed.  63 year old with COPD, paroxysmal atrial fibrillation not on anticoagulation, chronic hypoxic respiratory failure on 2 L oxygen , non-small cell lung cancer with mets to the brain admitted on 6/15 with severe sepsis due to currently acquired pneumonia, hypoxic, hypercarbic respiratory failure.  PCCM called today for worsening respiratory failure.  Blood pressure 125/68, pulse 92, temperature 98 F (36.7 C), temperature source Axillary, resp. rate 18, height 5' 5 (1.651 m), weight 49.9 kg, SpO2 100%. Gen:   Cachectic, frail, elderly HEENT:  EOMI, sclera anicteric Neck:     No masses; no thyromegaly Lungs:    Clear to auscultation bilaterally; normal respiratory effort CV:         Regular rate and rhythm; no murmurs Abd:      + bowel sounds; soft, non-tender; no palpable masses, no distension Ext:    No edema; adequate peripheral perfusion Neuro: Awake, nods to questions  Labs/Imaging personally reviewed, significant for CT angiogram, no pulmonary embolism, new right middle lobe, lower lobe consolidation small effusion Reviewed images personally  Assessment/plan: Severe sepsis secondary to community-acquired pneumonia, present on admission Hypoxic, hypercarbic respiratory failure Dysphagia, severe aspiration ICU for closer monitoring Continue IV antibiotics Plan bronchoscopy.  High risk for intubation Chest x-ray, EKG Bedside ultrasound to evaluate  Vocal cord paralysis Follows with ENT at [facility].  Plan noted for possible elective tracheostomy. If she ends up getting intubated then will need to proceed with tracheostomy during this admission  A-fib with RVR Telemetry monitoring Continue metoprolol continue clonidine.  Blood pressure  COPD Stage IV non-small cell lung cancer Continue Breztri , prednisone  Has not followed up with oncology recently.  Will need reassessment of her  status.  Severe protein calorie malnutrition, present on admission Dysphagia Nutrition following.  Will need cortrak placement   The patient is critically ill with multiple organ systems failure and requires high complexity decision making for assessment and support, frequent evaluation and titration of therapies, application of advanced monitoring technologies and extensive interpretation of multiple databases.  Critical care time - 55 mins. This represents my time independent of the NPs time taking care of the pt.  Roniel Halloran MD  Pulmonary and Critical Care 09/21/2023, 7:34 AM

## 2023-09-21 NOTE — Progress Notes (Signed)
 Patient transferred to 3M08C-01, alert and oriented x3-4, stridor noted, skin intact, NSR noted on monitor, O2 sats 100% on 2L/[place], attempt made to reach [facility] (brother Josefina Nian) was unsuccessful, number is no longer in service.

## 2023-09-22 ENCOUNTER — Inpatient Hospital Stay (HOSPITAL_COMMUNITY)

## 2023-09-22 ENCOUNTER — Telehealth (HOSPITAL_BASED_OUTPATIENT_CLINIC_OR_DEPARTMENT_OTHER): Payer: Self-pay | Admitting: *Deleted

## 2023-09-22 DIAGNOSIS — E43 Unspecified severe protein-calorie malnutrition: Secondary | ICD-10-CM

## 2023-09-22 DIAGNOSIS — Z7189 Other specified counseling: Secondary | ICD-10-CM

## 2023-09-22 DIAGNOSIS — R6520 Severe sepsis without septic shock: Secondary | ICD-10-CM | POA: Diagnosis not present

## 2023-09-22 DIAGNOSIS — J3800 Paralysis of vocal cords and larynx, unspecified: Secondary | ICD-10-CM | POA: Diagnosis not present

## 2023-09-22 DIAGNOSIS — J9621 Acute and chronic respiratory failure with hypoxia: Secondary | ICD-10-CM | POA: Diagnosis not present

## 2023-09-22 DIAGNOSIS — J189 Pneumonia, unspecified organism: Secondary | ICD-10-CM | POA: Diagnosis not present

## 2023-09-22 DIAGNOSIS — C7931 Secondary malignant neoplasm of brain: Secondary | ICD-10-CM | POA: Diagnosis not present

## 2023-09-22 DIAGNOSIS — J449 Chronic obstructive pulmonary disease, unspecified: Secondary | ICD-10-CM

## 2023-09-22 DIAGNOSIS — C3490 Malignant neoplasm of unspecified part of unspecified bronchus or lung: Secondary | ICD-10-CM | POA: Diagnosis not present

## 2023-09-22 DIAGNOSIS — A419 Sepsis, unspecified organism: Secondary | ICD-10-CM | POA: Diagnosis not present

## 2023-09-22 LAB — CBC WITH DIFFERENTIAL/PLATELET
Abs Immature Granulocytes: 0.08 10*3/uL — ABNORMAL HIGH (ref 0.00–0.07)
Basophils Absolute: 0 10*3/uL (ref 0.0–0.1)
Basophils Relative: 0 %
Eosinophils Absolute: 0 10*3/uL (ref 0.0–0.5)
Eosinophils Relative: 0 %
HCT: 38.5 % (ref 36.0–46.0)
Hemoglobin: 11.3 g/dL — ABNORMAL LOW (ref 12.0–15.0)
Immature Granulocytes: 1 %
Lymphocytes Relative: 4 %
Lymphs Abs: 0.5 10*3/uL — ABNORMAL LOW (ref 0.7–4.0)
MCH: 29.7 pg (ref 26.0–34.0)
MCHC: 29.4 g/dL — ABNORMAL LOW (ref 30.0–36.0)
MCV: 101.3 fL — ABNORMAL HIGH (ref 80.0–100.0)
Monocytes Absolute: 0.8 10*3/uL (ref 0.1–1.0)
Monocytes Relative: 6 %
Neutro Abs: 11.7 10*3/uL — ABNORMAL HIGH (ref 1.7–7.7)
Neutrophils Relative %: 89 %
Platelets: 244 10*3/uL (ref 150–400)
RBC: 3.8 MIL/uL — ABNORMAL LOW (ref 3.87–5.11)
RDW: 13 % (ref 11.5–15.5)
WBC: 13 10*3/uL — ABNORMAL HIGH (ref 4.0–10.5)
nRBC: 0 % (ref 0.0–0.2)

## 2023-09-22 LAB — BASIC METABOLIC PANEL WITH GFR
Anion gap: 7 (ref 5–15)
BUN: 17 mg/dL (ref 8–23)
CO2: 37 mmol/L — ABNORMAL HIGH (ref 22–32)
Calcium: 8.9 mg/dL (ref 8.9–10.3)
Chloride: 103 mmol/L (ref 98–111)
Creatinine, Ser: 0.72 mg/dL (ref 0.44–1.00)
GFR, Estimated: >60 mL/min (ref >60)
Glucose, Bld: 160 mg/dL — ABNORMAL HIGH (ref 70–99)
Potassium: 3.4 mmol/L — ABNORMAL LOW (ref 3.5–5.1)
Sodium: 147 mmol/L — ABNORMAL HIGH (ref 135–145)

## 2023-09-22 LAB — PHOSPHORUS: Phosphorus: 2.2 mg/dL — ABNORMAL LOW (ref 2.5–4.6)

## 2023-09-22 LAB — PROCALCITONIN: Procalcitonin: 1.4 ng/mL

## 2023-09-22 LAB — GLUCOSE, CAPILLARY
Glucose-Capillary: 131 mg/dL — ABNORMAL HIGH (ref 70–99)
Glucose-Capillary: 132 mg/dL — ABNORMAL HIGH (ref 70–99)
Glucose-Capillary: 142 mg/dL — ABNORMAL HIGH (ref 70–99)
Glucose-Capillary: 149 mg/dL — ABNORMAL HIGH (ref 70–99)
Glucose-Capillary: 154 mg/dL — ABNORMAL HIGH (ref 70–99)

## 2023-09-22 LAB — BRAIN NATRIURETIC PEPTIDE: B Natriuretic Peptide: 153.8 pg/mL — ABNORMAL HIGH (ref 0.0–100.0)

## 2023-09-22 LAB — MAGNESIUM: Magnesium: 1.7 mg/dL (ref 1.7–2.4)

## 2023-09-22 LAB — C-REACTIVE PROTEIN: CRP: 12.3 mg/dL — ABNORMAL HIGH

## 2023-09-22 LAB — AMMONIA: Ammonia: 27 umol/L (ref 9–35)

## 2023-09-22 MED ORDER — SODIUM CHLORIDE 0.9 % IV SOLN
3.0000 g | Freq: Four times a day (QID) | INTRAVENOUS | Status: AC
  Administered 2023-09-22 – 2023-09-26 (×16): 3 g via INTRAVENOUS
  Filled 2023-09-22 (×16): qty 8

## 2023-09-22 MED ORDER — THIAMINE HCL 100 MG/ML IJ SOLN
100.0000 mg | Freq: Every day | INTRAMUSCULAR | Status: AC
  Administered 2023-09-22 – 2023-09-26 (×5): 100 mg via INTRAVENOUS
  Filled 2023-09-22 (×5): qty 2

## 2023-09-22 MED ORDER — POTASSIUM CHLORIDE 10 MEQ/50ML IV SOLN
10.0000 meq | INTRAVENOUS | Status: AC
  Administered 2023-09-22 (×6): 10 meq via INTRAVENOUS
  Filled 2023-09-22 (×6): qty 50

## 2023-09-22 MED ORDER — MAGNESIUM SULFATE 4 GM/100ML IV SOLN
4.0000 g | Freq: Once | INTRAVENOUS | Status: AC
  Administered 2023-09-22: 4 g via INTRAVENOUS
  Filled 2023-09-22: qty 100

## 2023-09-22 MED ORDER — POTASSIUM CHLORIDE 20 MEQ PO PACK: 60.0000 meq | PACK | Freq: Once | ORAL | Status: DC

## 2023-09-22 MED ORDER — ADULT MULTIVITAMIN W/MINERALS CH
1.0000 | ORAL_TABLET | Freq: Every day | ORAL | Status: DC
  Administered 2023-09-22 – 2023-09-28 (×7): 1
  Filled 2023-09-22 (×7): qty 1

## 2023-09-22 MED ORDER — SODIUM PHOSPHATES 45 MMOLE/15ML IV SOLN
30.0000 mmol | Freq: Once | INTRAVENOUS | Status: AC
  Administered 2023-09-22: 30 mmol via INTRAVENOUS
  Filled 2023-09-22: qty 10

## 2023-09-22 MED ORDER — OSMOLITE 1.5 CAL PO LIQD
1000.0000 mL | ORAL | Status: DC
  Administered 2023-09-22 – 2023-09-27 (×3): 1000 mL
  Filled 2023-09-22 (×8): qty 1000

## 2023-09-22 MED ORDER — PROSOURCE TF20 ENFIT COMPATIBL EN LIQD
60.0000 mL | Freq: Every day | ENTERAL | Status: DC
Start: 2023-09-22 — End: 2023-09-28
  Administered 2023-09-22 – 2023-09-28 (×7): 60 mL
  Filled 2023-09-22 (×7): qty 60

## 2023-09-22 MED ORDER — DEXTROSE 5 % IV SOLN: INTRAVENOUS | Status: AC

## 2023-09-22 MED ORDER — IOHEXOL 350 MG/ML SOLN
75.0000 mL | Freq: Once | INTRAVENOUS | Status: AC | PRN
  Administered 2023-09-22: 75 mL via INTRAVENOUS

## 2023-09-22 NOTE — Progress Notes (Signed)
   09/22/23 2346  BiPAP/CPAP/SIPAP  Reason BIPAP/CPAP not in use Non-compliant (patient has NG tube in place)  BiPAP/CPAP /SiPAP Vitals  Pulse Rate 66  SpO2 100 %  Bilateral Breath Sounds Rhonchi;Diminished  MEWS Score/Color  MEWS Score 1  MEWS Score Color Marrie Sizer

## 2023-09-22 NOTE — Progress Notes (Signed)
 PCCM note  Discussed with patient and reviewed with palliative care.  Patient appears to be in denial about her diagnosis and is uncertain whether she wants aggressive care which will require tracheostomy and PEG or more comfort focused care and hospice with DNR  At this moment she is clinically stable for transfer to progressive care and back to hospitalist service.  Tyhesha Dutson MD [place] [facility] See Amion for pager  If no response to pager , please call [phone] until 7pm After 7:00 pm call Elink  [phone] 09/22/2023, 5:13 PM

## 2023-09-22 NOTE — Procedures (Signed)
 Cortrak  Person Inserting Tube:  Edwena Graham D, RD Tube Type:  Cortrak - 43 inches Tube Size:  10 Tube Location:  Left nare Initial Placement:  Stomach Secured by: Bridle Technique Used to Measure Tube Placement:  Marking at nare/corner of mouth Cortrak Secured At:  65 cm Procedure Comments:  Cortrak Tube Team Note:  Consult received to place a Cortrak feeding tube.   No x-ray is required. RN may begin using tube.   If the tube becomes dislodged please keep the tube and contact the Cortrak team at [email or web address] for replacement.  If after hours and replacement cannot be delayed, place a NG tube and confirm placement with an abdominal x-ray.    Edwena Graham, RD, LDN, CNSC Registered Dietitian II Please reach out via secure chat

## 2023-09-22 NOTE — Progress Notes (Signed)
 Report called to [facility] to Abe Abed, Charity fundraiser. Will be transferred to 4E-25.

## 2023-09-22 NOTE — TOC Progression Note (Signed)
 Transition of Care [facility]) - Progression Note    Patient Details  Name: Elizabeth Mayer MRN: [id number] Date of Birth: 08/17/1960  Transition of [facility]) CM/SW Contact  Katrinka Parr, [place] Phone Number: 09/22/2023, 9:51 AM  Clinical Narrative:     TOC consulted for due dilligence search for [facility] and referral to APS if no contacts can be found. TOC unable to locate any NOK. Pt's brothers phone number seems to be disconnected. APS report made.                                         Social Determinants of Health (SDOH) Interventions SDOH Screenings   Food Insecurity: No Food Insecurity (02/15/2023)  Housing: Low Risk  (02/15/2023)  Transportation Needs: No Transportation Needs (02/15/2023)  Utilities: Not At Risk (02/15/2023)  Alcohol Screen: Low Risk  (02/15/2023)  Depression (PHQ2-9): Low Risk  (02/15/2023)  Financial Resource Strain: Low Risk  (02/15/2023)  Physical Activity: Sufficiently Active (02/15/2023)  Social Connections: Moderately Isolated (02/15/2023)  Stress: No Stress Concern Present (02/15/2023)  Tobacco Use: Medium Risk (09/19/2023)  Health Literacy: Adequate Health Literacy (02/15/2023)    Readmission Risk Interventions     No data to display

## 2023-09-22 NOTE — Progress Notes (Signed)
 PT Cancellation Note  Patient Details Name: Elizabeth Mayer MRN: [id number] DOB: 1961-02-13   Cancelled Treatment:    Reason Eval/Treat Not Completed: (P) Patient at procedure or test/unavailable Pt is having Cortrak placed. PT will follow back later for Evaluation.  Lauralyn Shadowens B. Jewel Mortimer PT, DPT Acute Rehabilitation Services Please use secure chat or  Call Office [phone]    Verlie Glisson [facility] 09/22/2023, 9:47 AM
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
## 2023-09-22 NOTE — Evaluation (Signed)
 Physical Therapy Evaluation Patient Details Name: Elizabeth Mayer MRN: [id number] DOB: 11/02/1960 Today's Date: 09/22/2023  History of Present Illness  63 year old admitted on 6/15 with severe sepsis due to currently acquired pneumonia, hypoxic, hypercarbic respiratory failure.  Moved to ICU 6/17 for worsening respiratory failure. UYQ:IHKV, paroxysmal atrial fibrillation not on anticoagulation, chronic hypoxic respiratory failure on 2 L oxygen  (although when asked she reports she has not been using) non-small cell lung cancer with mets to the brain.  Clinical Impression  PTA pt living alone in second floor apartment with steps to enter. Pt reports using a cane for ambulation, walking limited community distances, pt reports she was independent in ADLs, and iADLs. Pt is currently limited in safe mobility by increased O2 demand, in presence of decrease strength, and activity tolerance. With [facility] elevated pt is min A for bed mobility and for stepping transfer from bed to recliner. Patient will benefit from continued inpatient follow up therapy, <3 hours/day. PT will continue to follow acutely.           If plan is discharge home, recommend the following: A little help with walking and/or transfers;A little help with bathing/dressing/bathroom;Assistance with cooking/housework;Direct supervision/assist for medications management;Direct supervision/assist for financial management;Assist for transportation;Help with stairs or ramp for entrance   Can travel by private vehicle   No    Equipment Recommendations Rolling walker (2 wheels)  Recommendations for Other Services  OT consult    Functional Status Assessment Patient has had a recent decline in their functional status and demonstrates the ability to make significant improvements in function in a reasonable and predictable amount of time.     Precautions / Restrictions Precautions Precautions: Fall Precaution/Restrictions Comments: new  Cortrak Restrictions Weight Bearing Restrictions Per Provider Order: No      Mobility  Bed Mobility Overal bed mobility: Needs Assistance Bed Mobility: Supine to Sit     Supine to sit: HOB elevated, Used rails, Min assist     General bed mobility comments: with HoB elevated and guidance of UE to bed rail, pt able to manage LE off bed, minA for bringing trunk to upright    Transfers Overall transfer level: Needs assistance Equipment used: 1 person hand held assist Transfers: Sit to/from Stand, Bed to chair/wheelchair/BSC Sit to Stand: Min assist, From elevated surface   Step pivot transfers: Min assist       General transfer comment: PT with face to face assist, pt with good power up and min A for steadying, min A for steadying with pivotal steps from bed to recliner    Ambulation/Gait               General Gait Details: deferred, adult protective services present     Balance Overall balance assessment: Needs assistance Sitting-balance support: Feet supported, No upper extremity supported Sitting balance-Leahy Scale: Fair     Standing balance support: Bilateral upper extremity supported, During functional activity, Reliant on assistive device for balance Standing balance-Leahy Scale: Poor Standing balance comment: requires outside support for steadying with static and dynamic balance                             Pertinent Vitals/Pain Pain Assessment Pain Assessment: No/denies pain    Home Living Family/patient expects to be discharged to:: Private residence Living Arrangements: Alone Available Help at Discharge: Family;Available PRN/intermittently (very intermittently) Type of Home: Apartment Home Access: Stairs to enter Entrance Stairs-Rails: Right;Left Entrance Stairs-Number of Steps:  flight   Home Layout: One level Home Equipment: Cane - single point;Shower seat;Hand held shower head      Prior Function Prior Level of Function :  Independent/Modified Independent             Mobility Comments: reports using cane and having difficulty with getting up the flight of steps due to SoB ADLs Comments: independent, but having difficulty     Extremity/Trunk Assessment   Upper Extremity Assessment Upper Extremity Assessment: Generalized weakness;Defer to OT evaluation    Lower Extremity Assessment Lower Extremity Assessment: Generalized weakness    Cervical / Trunk Assessment Cervical / Trunk Assessment: Kyphotic  Communication   Communication Communication: Impaired Factors Affecting Communication: Reduced clarity of speech (vocal chord paralysis)    Cognition Arousal: Alert Behavior During Therapy: WFL for tasks assessed/performed   PT - Cognitive impairments: Difficult to assess Difficult to assess due to: Impaired communication (vocal chord paralysis)                     PT - Cognition Comments: questions she is answers seems appropriate Following commands: Intact       Cueing Cueing Techniques: Verbal cues, Gestural cues, Tactile cues, Visual cues     General Comments General comments (skin integrity, edema, etc.): VSS on 3L O2 via [place]    Exercises     Assessment/Plan    PT Assessment Patient needs continued PT services  PT Problem List Decreased strength;Decreased activity tolerance;Decreased balance;Decreased mobility;Decreased coordination;Decreased cognition;Cardiopulmonary status limiting activity       PT Treatment Interventions DME instruction;Gait training;Stair training;Functional mobility training;Therapeutic activities;Therapeutic exercise;Balance training;Cognitive remediation;Patient/family education    PT Goals (Current goals can be found in the Care Plan section)  Acute Rehab PT Goals PT Goal Formulation: With patient Time For Goal Achievement: 10/06/23 Potential to Achieve Goals: Fair    Frequency Min 2X/week     Co-evaluation               AM-PAC  PT 6 Clicks Mobility  Outcome Measure Help needed turning from your back to your side while in a flat bed without using bedrails?: None Help needed moving from lying on your back to sitting on the side of a flat bed without using bedrails?: A Lot Help needed moving to and from a bed to a chair (including a wheelchair)?: A Little Help needed standing up from a chair using your arms (e.g., wheelchair or bedside chair)?: A Little Help needed to walk in hospital room?: A Lot Help needed climbing 3-5 steps with a railing? : Total 6 Click Score: 15    End of Session Equipment Utilized During Treatment: Gait belt;Oxygen  Activity Tolerance: Patient limited by fatigue;Patient tolerated treatment well Patient left: in chair;with call bell/phone within reach;with chair alarm set;with family/visitor present Nurse Communication: Mobility status PT Visit Diagnosis: Unsteadiness on feet (R26.81);Other abnormalities of gait and mobility (R26.89);Muscle weakness (generalized) (M62.81);Difficulty in walking, not elsewhere classified (R26.2);Adult, failure to thrive (R62.7)    Time: 3875-6433 PT Time Calculation (min) (ACUTE ONLY): 29 min   Charges:   PT Evaluation $PT Eval Moderate Complexity: 1 Mod PT Treatments $Therapeutic Activity: 8-22 mins PT General Charges $$ ACUTE PT VISIT: 1 Visit         Aarianna Hoadley B. Jewel Mortimer PT, DPT Acute Rehabilitation Services Please use secure chat or  Call Office [phone]   [facility] 09/22/2023, 12:32 PM

## 2023-09-22 NOTE — Progress Notes (Addendum)
 NAME:  Elizabeth Mayer, MRN:  [id number], DOB:  [birth year], LOS: 3 ADMISSION DATE:  09/19/2023, CONSULTATION DATE: 09/21/2023 REFERRING MD: [facility], CHIEF COMPLAINT: Acute on chronic respiratory distress  History of Present Illness:   63 year old with COPD, paroxysmal atrial fibrillation not on anticoagulation, chronic hypoxic respiratory failure on 2 L oxygen , non-small cell lung cancer with mets to the brain admitted on 6/15 with severe sepsis due to currently acquired pneumonia, hypoxic, hypercarbic respiratory failure.  PCCM called today for worsening respiratory failure.  Pertinent  Medical History    has a past medical history of Anemia, Angina (1982), Asthma, Atrial fibrillation (HCC), Complication of anesthesia, Constipation, COPD (chronic obstructive pulmonary disease) (HCC), Dyspnea, Dysrhythmia, Fall (06/01/2023), Fatigue, GERD (gastroesophageal reflux disease), Heart murmur (1970s), Hypertension, Ingrown toenail, Lung cancer (HCC) (10/2019), On home oxygen  therapy, Past heart attack (2536-6440), Peripheral vascular disease (HCC), and Pneumonia.    Significant Hospital Events: Including procedures, antibiotic start and stop dates in addition to other pertinent events     Interim History / Subjective:  63 year old with acute on chronic respiratory failure  Objective    Blood pressure 112/66, pulse 78, temperature 98 F (36.7 C), temperature source Axillary, resp. rate 20, height 5' 5 (1.651 m), weight 49.9 kg, SpO2 98%.    Vent Mode: PCV;BIPAP FiO2 (%):  [28 %-40 %] 28 % Set Rate:  [15 bmp-20 bmp] 20 bmp PEEP:  [5 cmH20] 5 cmH20   Intake/Output Summary (Last 24 hours) at 09/22/2023 0819 Last data filed at 09/22/2023 0700 Gross per 24 hour  Intake 1942.93 ml  Output 200 ml  Net 1742.93 ml   Filed Weights   09/20/23 1630 09/20/23 1940 09/21/23 0500  Weight: 49.9 kg 49.9 kg 49.9 kg    Examination: Blood pressure 112/66, pulse 78, temperature 98 F (36.7 C), temperature  source Axillary, resp. rate 20, height 5' 5 (1.651 m), weight 49.9 kg, SpO2 98%. Gen:      No acute distress frail, cachectic HEENT:  EOMI, sclera anicteric Neck:     No masses; no thyromegaly Lungs:    Clear to auscultation bilaterally; normal respiratory effort\ CV:         Regular rate and rhythm; no murmurs Abd:      + bowel sounds; soft, non-tender; no palpable masses, no distension Ext:    No edema; adequate peripheral perfusion Neuro: Left facial droop, soft, dysarthric voice, inspiratory stridor  Lab/imaging reviewed Significant for sodium 147, BUN/creatinine 17/0.72 WBC 13.0, hemoglobin 11.3, platelets 244  Resolved problem list   Assessment and Plan  Severe sepsis secondary to community-acquired pneumonia, present on admission Hypoxic, hypercarbic respiratory failure Dysphagia, severe aspiration In ICU for closer monitoring but did not need intubation.  Respiratory status improved today Continue IV antibiotics, follow x-ray to see if she will develop an effusion on the right.  Bedside ultrasound in 6/17 with no significant pocket to drain Follow urine streptococcal antigen, Legionella Dysphagia is likely secondary to nerve paralysis from brain mass. Order CT chest and CT neck with contrast to see if there is any other alternate pathology to explain dysphagia.   Vocal cord paralysis Follows with ENT at [facility].  Plan noted for possible elective tracheostomy.   A-fib with RVR Telemetry monitoring Continue metoprolol   COPD Stage IV non-small cell lung cancer with recurrent metastasis Continue Breztri , prednisone    Severe protein calorie malnutrition, present on admission Severe dysphagia Nutrition following.  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  I have discussed these findings with her outpatient oncology team Dr. Josphine Nims and [name].  The new lesion is treatable by radiotherapy if she can recover to discharge.   She will likely need tracheostomy and PEG tube for vocal cord paralysis and severe dysphagia, severe malnutrition if she were to recover to be able to get radiotherapy.  All this was discussed with the patient and code status discussed.  Patient wants to think some more before deciding on goals of care.  She is also trying to reach her brother to help with this.  She remains full code for now.  Best Practice (right click and Reselect all SmartList Selections daily)   Diet/type: NPO DVT prophylaxis LMWH Pressure ulcer(s): N/A GI prophylaxis: PPI Lines: Academic librarian (Port) Foley:  N/A Code Status:  full code Last date of multidisciplinary goals of care discussion [tbd]  Critical care time: NA   Phyllis Breeze MD [facility] See Amion for pager  If no response to pager , please call [phone] until 7pm After 7:00 pm call Elink  [phone] 09/22/2023, 8:19 AM

## 2023-09-22 NOTE — Progress Notes (Signed)
 Attempted contact to brother Gery Kubas [phone] and [phone] but no answer. Called 2nd contact-brother-Remene Theone Fitting and notified him that she is being transferred to 4E-25.

## 2023-09-23 ENCOUNTER — Inpatient Hospital Stay (HOSPITAL_COMMUNITY)

## 2023-09-23 ENCOUNTER — Encounter (HOSPITAL_COMMUNITY)
Admission: EM | Disposition: A | Discharge status: Hospice | Payer: Self-pay | Source: Home / Self Care | Attending: Internal Medicine

## 2023-09-23 ENCOUNTER — Encounter (HOSPITAL_COMMUNITY): Payer: Self-pay | Admitting: Internal Medicine

## 2023-09-23 DIAGNOSIS — J3800 Paralysis of vocal cords and larynx, unspecified: Secondary | ICD-10-CM | POA: Diagnosis not present

## 2023-09-23 DIAGNOSIS — C3490 Malignant neoplasm of unspecified part of unspecified bronchus or lung: Secondary | ICD-10-CM | POA: Diagnosis not present

## 2023-09-23 DIAGNOSIS — J9621 Acute and chronic respiratory failure with hypoxia: Secondary | ICD-10-CM | POA: Diagnosis not present

## 2023-09-23 DIAGNOSIS — J90 Pleural effusion, not elsewhere classified: Secondary | ICD-10-CM

## 2023-09-23 DIAGNOSIS — Z7189 Other specified counseling: Secondary | ICD-10-CM | POA: Diagnosis not present

## 2023-09-23 DIAGNOSIS — Z515 Encounter for palliative care: Secondary | ICD-10-CM | POA: Diagnosis not present

## 2023-09-23 DIAGNOSIS — J189 Pneumonia, unspecified organism: Secondary | ICD-10-CM | POA: Diagnosis not present

## 2023-09-23 HISTORY — PX: THORACENTESIS: SHX235

## 2023-09-23 LAB — BASIC METABOLIC PANEL WITH GFR
Anion gap: 3 — ABNORMAL LOW (ref 5–15)
BUN: 17 mg/dL (ref 8–23)
CO2: 41 mmol/L — ABNORMAL HIGH (ref 22–32)
Calcium: 8.6 mg/dL — ABNORMAL LOW (ref 8.9–10.3)
Chloride: 101 mmol/L (ref 98–111)
Creatinine, Ser: 0.65 mg/dL (ref 0.44–1.00)
GFR, Estimated: >60 mL/min (ref >60)
Glucose, Bld: 170 mg/dL — ABNORMAL HIGH (ref 70–99)
Potassium: 3.5 mmol/L (ref 3.5–5.1)
Sodium: 145 mmol/L (ref 135–145)

## 2023-09-23 LAB — LACTATE DEHYDROGENASE, PLEURAL OR PERITONEAL FLUID: LD, Fluid: 89 U/L — ABNORMAL HIGH (ref 3–23)

## 2023-09-23 LAB — CBC WITH DIFFERENTIAL/PLATELET
Abs Immature Granulocytes: 0.06 10*3/uL (ref 0.00–0.07)
Basophils Absolute: 0 10*3/uL (ref 0.0–0.1)
Basophils Relative: 0 %
Eosinophils Absolute: 0 10*3/uL (ref 0.0–0.5)
Eosinophils Relative: 0 %
HCT: 37 % (ref 36.0–46.0)
Hemoglobin: 11.1 g/dL — ABNORMAL LOW (ref 12.0–15.0)
Immature Granulocytes: 1 %
Lymphocytes Relative: 4 %
Lymphs Abs: 0.5 10*3/uL — ABNORMAL LOW (ref 0.7–4.0)
MCH: 29.9 pg (ref 26.0–34.0)
MCHC: 30 g/dL (ref 30.0–36.0)
MCV: 99.7 fL (ref 80.0–100.0)
Monocytes Absolute: 0.6 10*3/uL (ref 0.1–1.0)
Monocytes Relative: 5 %
Neutro Abs: 10.7 10*3/uL — ABNORMAL HIGH (ref 1.7–7.7)
Neutrophils Relative %: 90 %
Platelets: 244 10*3/uL (ref 150–400)
RBC: 3.71 MIL/uL — ABNORMAL LOW (ref 3.87–5.11)
RDW: 13 % (ref 11.5–15.5)
WBC: 11.8 10*3/uL — ABNORMAL HIGH (ref 4.0–10.5)
nRBC: 0 % (ref 0.0–0.2)

## 2023-09-23 LAB — AMMONIA: Ammonia: 34 umol/L (ref 9–35)

## 2023-09-23 LAB — PHOSPHORUS: Phosphorus: 2 mg/dL — ABNORMAL LOW (ref 2.5–4.6)

## 2023-09-23 LAB — BODY FLUID CELL COUNT WITH DIFFERENTIAL
Lymphs, Fluid: 50 %
Monocyte-Macrophage-Serous Fluid: 36 % — ABNORMAL LOW (ref 50–90)
Neutrophil Count, Fluid: 14 % (ref 0–25)
Total Nucleated Cell Count, Fluid: 692 uL (ref 0–1000)

## 2023-09-23 LAB — GLUCOSE, CAPILLARY
Glucose-Capillary: 143 mg/dL — ABNORMAL HIGH (ref 70–99)
Glucose-Capillary: 140 mg/dL — ABNORMAL HIGH (ref 70–99)
Glucose-Capillary: 120 mg/dL — ABNORMAL HIGH (ref 70–99)
Glucose-Capillary: 162 mg/dL — ABNORMAL HIGH (ref 70–99)
Glucose-Capillary: 171 mg/dL — ABNORMAL HIGH (ref 70–99)

## 2023-09-23 LAB — MAGNESIUM: Magnesium: 2.1 mg/dL (ref 1.7–2.4)

## 2023-09-23 LAB — C-REACTIVE PROTEIN: CRP: 6.5 mg/dL — ABNORMAL HIGH (ref <1.0)

## 2023-09-23 LAB — PROTEIN, PLEURAL OR PERITONEAL FLUID: Total protein, fluid: <3 g/dL

## 2023-09-23 LAB — BRAIN NATRIURETIC PEPTIDE: B Natriuretic Peptide: 131.1 pg/mL — ABNORMAL HIGH (ref 0.0–100.0)

## 2023-09-23 LAB — PROCALCITONIN: Procalcitonin: 0.82 ng/mL

## 2023-09-23 SURGERY — THORACENTESIS
Anesthesia: LOCAL | Laterality: Right

## 2023-09-23 MED ORDER — ACETAMINOPHEN 325 MG PO TABS
650.0000 mg | ORAL_TABLET | Freq: Four times a day (QID) | ORAL | Status: DC | PRN
  Administered 2023-09-25 – 2023-09-27 (×3): 650 mg
  Filled 2023-09-23 (×4): qty 2

## 2023-09-23 MED ORDER — METOPROLOL TARTRATE 25 MG/10 ML ORAL SUSPENSION
12.5000 mg | Freq: Two times a day (BID) | ORAL | Status: DC
  Administered 2023-09-23 – 2023-09-24 (×4): 12.5 mg
  Filled 2023-09-23 (×4): qty 5

## 2023-09-23 MED ORDER — MELATONIN 3 MG PO TABS: 3.0000 mg | ORAL_TABLET | Freq: Every evening | ORAL | Status: DC | PRN

## 2023-09-23 MED ORDER — GUAIFENESIN 100 MG/5ML PO LIQD
20.0000 mL | Freq: Three times a day (TID) | ORAL | Status: DC
  Administered 2023-09-23 – 2023-09-28 (×16): 20 mL
  Filled 2023-09-23 (×15): qty 20

## 2023-09-23 MED ORDER — TIZANIDINE HCL 4 MG PO TABS: 2.0000 mg | ORAL_TABLET | Freq: Two times a day (BID) | ORAL | Status: DC | PRN

## 2023-09-23 MED ORDER — DEXTROSE 5 % IV SOLN: INTRAVENOUS | Status: DC

## 2023-09-23 MED ORDER — FREE WATER
200.0000 mL | Status: DC
  Administered 2023-09-23 – 2023-09-28 (×30): 200 mL

## 2023-09-23 MED ORDER — ACETAMINOPHEN 650 MG RE SUPP: 650.0000 mg | Freq: Four times a day (QID) | RECTAL | Status: DC | PRN

## 2023-09-23 MED ORDER — SALINE SPRAY 0.65 % NA SOLN
1.0000 | NASAL | Status: DC | PRN
  Filled 2023-09-23: qty 44

## 2023-09-23 NOTE — Plan of Care (Signed)
  Problem: Clinical Measurements: Goal: Diagnostic test results will improve Outcome: Not Progressing   Problem: Activity: Goal: Risk for activity intolerance will decrease Outcome: Not Progressing   Problem: Nutrition: Goal: Adequate nutrition will be maintained Outcome: Not Progressing   
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
## 2023-09-23 NOTE — Procedures (Signed)
 Thoracentesis  Procedure Note  Elizabeth Mayer  [id number]  [birth year]  Date:09/23/23  Time:11:09 AM   Provider Performing:Aydon Swamy   Procedure: Thoracentesis with imaging guidance (78295)  Indication(s) Pleural Effusion  Consent Risks of the procedure as well as the alternatives and risks of each were explained to the patient and/or caregiver.  Consent for the procedure was obtained and is signed in the bedside chart  Anesthesia Topical only with 1% lidocaine     Time Out Verified patient identification, verified procedure, site/side was marked, verified correct patient position, special equipment/implants available, medications/allergies/relevant history reviewed, required imaging and test results available.   Sterile Technique Maximal sterile technique including full sterile barrier drape, hand hygiene, sterile gown, sterile gloves, mask, hair covering, sterile ultrasound probe cover (if used).  Procedure Description Ultrasound was used to identify appropriate pleural anatomy for placement and overlying skin marked.  Area of drainage cleaned and draped in sterile fashion. Lidocaine  was used to anesthetize the skin and subcutaneous tissue.  300 cc's of straw appearing fluid was drained from the right pleural space. Catheter then removed and bandaid applied to site.   Complications/Tolerance None; patient tolerated the procedure well. Chest X-ray is ordered to confirm no post-procedural complication.   EBL Minimal   Specimen(s) Pleural fluid  Elizabeth Breeze MD [facility] See Amion for pager  If no response to pager , please call [phone] until 7pm After 7:00 pm call Elink  [phone] 09/23/2023, 11:10 AM

## 2023-09-23 NOTE — Progress Notes (Signed)
 NAME:  Elizabeth Mayer, MRN:  [id number], DOB:  Nov 28, 1960, LOS: 4 ADMISSION DATE:  09/19/2023, CONSULTATION DATE: 09/21/2023 REFERRING MD: [facility], CHIEF COMPLAINT: Acute on chronic respiratory distress  History of Present Illness:   63 year old with COPD, paroxysmal atrial fibrillation not on anticoagulation, chronic hypoxic respiratory failure on 2 L oxygen , non-small cell lung cancer with mets to the brain admitted on 6/15 with severe sepsis due to currently acquired pneumonia, hypoxic, hypercarbic respiratory failure.  PCCM called today for worsening respiratory failure.  Pertinent  Medical History    has a past medical history of Anemia, Angina (1982), Asthma, Atrial fibrillation (HCC), Complication of anesthesia, Constipation, COPD (chronic obstructive pulmonary disease) (HCC), Dyspnea, Dysrhythmia, Fall (06/01/2023), Fatigue, GERD (gastroesophageal reflux disease), Heart murmur (1970s), Hypertension, Ingrown toenail, Lung cancer (HCC) (10/2019), On home oxygen  therapy, Past heart attack ([id number]), Peripheral vascular disease (HCC), and Pneumonia.    Significant Hospital Events: Including procedures, antibiotic start and stop dates in addition to other pertinent events   6/15 admitted for acute hypoxic respiratory failure 6/17 transfer to ICU 6/18 transfer out of ICU  Interim History / Subjective:   Remained stable.  No new complaints  Objective    Blood pressure 123/61, pulse 75, temperature (!) 97.5 F (36.4 C), temperature source Oral, resp. rate 16, height 5' 5 (1.651 m), weight 49.5 kg, SpO2 100%.    FiO2 (%):  [28 %] 28 %   Intake/Output Summary (Last 24 hours) at 09/23/2023 0900 Last data filed at 09/23/2023 0124 Gross per 24 hour  Intake 2631.72 ml  Output --  Net 2631.72 ml   Filed Weights   09/20/23 1940 09/21/23 0500 09/23/23 0500  Weight: 49.9 kg 49.9 kg 49.5 kg    Examination: Gen:      No acute distress, frail, cachectic HEENT:  EOMI, sclera  anicteric Neck:     No masses; no thyromegaly Lungs:    Diminished breath sounds on the right CV:         Regular rate and rhythm; no murmurs Abd:      + bowel sounds; soft, non-tender; no palpable masses, no distension Ext:    No edema; adequate peripheral perfusion Neuro: Left facial droop, dysarthric voice, inspiratory stridor  Lab/imaging reviewed CT chest with moderate right effusion, right middle lobe, lower lobe collapse CT neck with post treatment changes and no acute process.  Resolved problem list   Assessment and Plan  Severe sepsis secondary to community-acquired pneumonia, present on admission Hypoxic, hypercarbic respiratory failure Dysphagia, severe aspiration Continue Unasyn Follow urine streptococcal antigen, Legionella Dysphagia is likely secondary to nerve paralysis from brain mass.  CT chest and CT neck with contrast does not show any other alternate pathology to explain dysphagia. Not sure if her right lower lobe opacity is due to recurrence of cancer with postobstructive pneumonia.  Will attempt thoracentesis of right effusion today for cultures and cytology.  She is not a candidate for bronchoscopy.   Vocal cord paralysis Follows with ENT at [facility].  Plan noted for possible elective tracheostomy.   A-fib with RVR Telemetry monitoring Continue metoprolol   COPD Stage IV non-small cell lung cancer with recurrent metastasis Continue Breztri , prednisone    Severe protein calorie malnutrition, present on admission Severe dysphagia Nutrition following.   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  I have discussed these findings with her outpatient oncology team Dr. Josphine Nims and [name].  The new lesion is treatable  by radiotherapy if she can recover to discharge.   She will likely need tracheostomy and PEG tube for vocal cord paralysis and severe dysphagia, severe malnutrition if she were to recover to be able to get radiotherapy.  All this was discussed with the patient and code status discussed.  Patient is in denial about her cancer recurrence and wants to think some more before deciding on goals of care.  She remains full code for now.  Palliative care is on board  Best Practice (right click and Reselect all SmartList Selections daily)   Diet/type: NPO DVT prophylaxis LMWH Pressure ulcer(s): N/A GI prophylaxis: PPI Lines: Central line (Port) Foley:  N/A Code Status:  full code Last date of multidisciplinary goals of care discussion [tbd]  Critical care time: NA   Phyllis Breeze MD [facility] See Amion for pager  If no response to pager , please call [phone] until 7pm After 7:00 pm call Elink  [phone] 09/23/2023, 9:00 AM

## 2023-09-23 NOTE — Progress Notes (Signed)
 Physical Therapy Treatment Patient Details Name: FLETA BORGESON MRN: [id number] DOB: [birth year] Today's Date: 09/23/2023   History of Present Illness 63 year old admitted on 6/15 with severe sepsis due to currently acquired pneumonia, hypoxic, hypercarbic respiratory failure.  Moved to ICU 6/17 for worsening respiratory failure. NWG:NFAO, paroxysmal atrial fibrillation not on anticoagulation, chronic hypoxic respiratory failure on 2 L oxygen  (although when asked she reports she has not been using) non-small cell lung cancer with mets to the brain.    PT Comments  Pt participated well in mobility around the bed, but is limited by general weakness and compromised respiratory status.  Emphasis on assessing strength, work on use of UE's to transition from supine to sitting EOB, sitting balance, safe sit to stands, standing balance, side-stepping toward [facility] before return to a supine position.     If plan is discharge home, recommend the following: A little help with walking and/or transfers;A little help with bathing/dressing/bathroom;Assistance with cooking/housework;Direct supervision/assist for medications management;Direct supervision/assist for financial management;Assist for transportation;Help with stairs or ramp for entrance   Can travel by private vehicle     No  Equipment Recommendations  Rolling walker (2 wheels)    Recommendations for Other Services       Precautions / Restrictions Precautions Precautions: Fall Precaution/Restrictions Comments: new Cortrak     Mobility  Bed Mobility Overal bed mobility: Needs Assistance Bed Mobility: Supine to Sit, Sit to Supine     Supine to sit: HOB elevated, Used rails, Min assist Sit to supine: Mod assist (Assisted LE's 1-2 person assist at points in the transition.)   General bed mobility comments: cues for sequencing to enable moving upper body and LE's more efficiently.    Transfers Overall transfer level: Needs  assistance Equipment used: 1 person hand held assist Transfers: Sit to/from Stand, Bed to chair/wheelchair/BSC Sit to Stand: Min assist, From elevated surface           General transfer comment: cues for hand placement, minimal stability assist.  side-stepping toward [facility] with min stability assist.    Ambulation/Gait   Gait Distance (Feet): 2 Feet (side-stepping)   Gait Pattern/deviations: Step-to pattern           Stairs             Wheelchair Mobility     Tilt Bed    Modified Rankin (Stroke Patients Only)       Balance Overall balance assessment: Needs assistance   Sitting balance-Leahy Scale: Fair     Standing balance support: Bilateral upper extremity supported, During functional activity, Reliant on assistive device for balance Standing balance-Leahy Scale: Poor Standing balance comment: reliant on external support from at least 1 therapist.                            Communication Communication Communication: Impaired Factors Affecting Communication: Reduced clarity of speech  Cognition   Behavior During Therapy: WFL for tasks assessed/performed   PT - Cognitive impairments: Difficult to assess Difficult to assess due to: Impaired communication                       Following commands: Intact      Cueing Cueing Techniques: Verbal cues, Gestural cues, Tactile cues, Visual cues  Exercises      General Comments General comments (skin integrity, edema, etc.): VSS even when pt perceives some SOB or when pt sounds like she is struggling to breathe.  Pertinent Vitals/Pain Pain Assessment Pain Assessment: Faces Faces Pain Scale: No hurt Pain Intervention(s): Monitored during session    Home Living                          Prior Function            PT Goals (current goals can now be found in the care plan section) Acute Rehab PT Goals Patient Stated Goal: improve mobiity/independence PT Goal  Formulation: With patient Time For Goal Achievement: 10/06/23 Potential to Achieve Goals: Fair Progress towards PT goals: Progressing toward goals    Frequency    Min 2X/week      PT Plan      Co-evaluation PT/OT/SLP Co-Evaluation/Treatment: Yes Reason for Co-Treatment: For patient/therapist safety;To address functional/ADL transfers PT goals addressed during session: Mobility/safety with mobility        AM-PAC PT 6 Clicks Mobility   Outcome Measure  Help needed turning from your back to your side while in a flat bed without using bedrails?: None Help needed moving from lying on your back to sitting on the side of a flat bed without using bedrails?: A Lot Help needed moving to and from a bed to a chair (including a wheelchair)?: A Little Help needed standing up from a chair using your arms (e.g., wheelchair or bedside chair)?: A Little Help needed to walk in hospital room?: A Lot Help needed climbing 3-5 steps with a railing? : Total 6 Click Score: 15    End of Session Equipment Utilized During Treatment: Oxygen  Activity Tolerance: Patient limited by fatigue;Patient tolerated treatment well Patient left: in bed;with call bell/phone within reach;with bed alarm set Nurse Communication: Mobility status PT Visit Diagnosis: Unsteadiness on feet (R26.81);Other abnormalities of gait and mobility (R26.89);Muscle weakness (generalized) (M62.81);Difficulty in walking, not elsewhere classified (R26.2);Adult, failure to thrive (R62.7)     Time: 1510-1539 PT Time Calculation (min) (ACUTE ONLY): 29 min  Charges:    $Therapeutic Activity: 8-22 mins PT General Charges $$ ACUTE PT VISIT: 1 Visit                     09/23/2023  Nohemi Batters., PT Acute Rehabilitation Services [phone]  (office)   Durell Gilding Claron Rosencrans 09/23/2023, 3:52 PM

## 2023-09-23 NOTE — Evaluation (Signed)
 Occupational Therapy Evaluation Patient Details Name: Elizabeth Mayer MRN: [id number] DOB: 1960/07/23 Today's Date: 09/23/2023   History of Present Illness   63 year old admitted on 6/15 with severe sepsis due to currently acquired pneumonia, hypoxic, hypercarbic respiratory failure.  Moved to ICU 6/17 for worsening respiratory failure. Cortrak placed 6/18. Pt s/p thoracentesis 6/19. PMH: Bilateral VF paralysis, COPD, paroxysmal atrial fibrillation not on anticoagulation, chronic hypoxic respiratory failure on 2 L oxygen  (although when asked she reports she has not been using) non-small cell lung cancer with mets to the brain.     Clinical Impressions At baseline, pt is completing ADLs Independent to Mod I, but reports difficulty due to SOB. At baseline, pt uses a cane for functional mobility, but reports difficulty navigating stairs to her apartment due to SOB. Pt now presents with significantly decreased activity tolerance, generalized B UE weakness, decreased R shoulder AROM, decreased balance, and decreased safety and independence with functional tasks. Pt currently demonstrates ability to complete UB ADLs with Supervision to Mod assist, LB ADLs with Max assist +2 for safety, and functional transfers with HHA +1 with Min assist. Pt with increased LOB and reporting perceives Sob intermittently during OT eval but with pt's O2 sat consistently >/97% on 3L continuous O2 through nasal cannula throughout session. Pt participated well in session but was limited by fatigue and impaired cardiopulmonary status. Pt will benefit from acute skilled OT services to address deficits outlined below and increase safety and independence with functional tasks. Post acute discharge, pt will benefit from intensive inpatient skilled rehab services < 3 hours per day to maximize rehab potential.      If plan is discharge home, recommend the following:   A little help with walking and/or transfers;Two people to help with  bathing/dressing/bathroom;Assistance with cooking/housework;Assist for transportation;Help with stairs or ramp for entrance     Functional Status Assessment   Patient has had a recent decline in their functional status and demonstrates the ability to make significant improvements in function in a reasonable and predictable amount of time.     Equipment Recommendations   BSC/3in1;Other (comment) (other TBD based on pt progress)     Recommendations for Other Services         Precautions/Restrictions   Precautions Precautions: Fall Precaution/Restrictions Comments: new Cortrak (6/18) Restrictions Weight Bearing Restrictions Per Provider Order: No     Mobility Bed Mobility Overal bed mobility: Needs Assistance Bed Mobility: Supine to Sit, Sit to Supine     Supine to sit: Min assist, HOB elevated, Used rails Sit to supine: Mod assist (Assisted with B LEs 1-2 person assist at points in the transition.)   General bed mobility comments: cues for sequencing to enable moving upper body and B LEs more efficiently.    Transfers Overall transfer level: Needs assistance Equipment used: 1 person hand held assist Transfers: Sit to/from Stand, Bed to chair/wheelchair/BSC Sit to Stand: Min assist, From elevated surface     Step pivot transfers: Min assist     General transfer comment: cues for hand placement, minimal stability assist.  side-stepping toward [facility] with min stability assist.      Balance Overall balance assessment: Needs assistance Sitting-balance support: No upper extremity supported, Feet supported Sitting balance-Leahy Scale: Fair     Standing balance support: Bilateral upper extremity supported, Single extremity supported, During functional activity, Reliant on assistive device for balance Standing balance-Leahy Scale: Poor Standing balance comment: reliant on external support from at least 1 therapist.  ADL either  performed or assessed with clinical judgement   ADL Overall ADL's : Needs assistance/impaired Eating/Feeding: NPO Eating/Feeding Details (indicate cue type and reason): Cortrak Grooming: Wash/dry hands;Wash/dry face;Supervision/safety;Contact guard assist;Sitting   Upper Body Bathing: Moderate assistance;Sitting;Cueing for compensatory techniques   Lower Body Bathing: Maximal assistance;+2 for safety/equipment;Sit to/from stand;Cueing for compensatory techniques   Upper Body Dressing : Minimal assistance;Moderate assistance;Sitting;Cueing for compensatory techniques   Lower Body Dressing: Maximal assistance;+2 for safety/equipment;Cueing for compensatory techniques;Sit to/from stand   Toilet Transfer: Minimal assistance;BSC/3in1 (HHA +1; step-pivot transfer)   Toileting- Clothing Manipulation and Hygiene: Maximal assistance;+2 for safety/equipment;Sit to/from stand;Cueing for compensatory techniques       Functional mobility during ADLs:  (deferred this session) General ADL Comments: Pt with significantly decreased activity tolerance and with impaired cardiopulmonary status affecting functional level. Pt requring increased time and effort and frequent rest breaks during functional tasks. Pt also with increased LOB with O2 sat stable during activities sitting EOB.     Vision Baseline Vision/History: 1 Wears glasses Ability to See in Adequate Light: 0 Adequate Patient Visual Report: No change from baseline       Perception         Praxis         Pertinent Vitals/Pain Pain Assessment Pain Assessment: Faces Faces Pain Scale: No hurt Pain Intervention(s): Monitored during session     Extremity/Trunk Assessment Upper Extremity Assessment Upper Extremity Assessment: Right hand dominant;Generalized weakness;RUE deficits/detail RUE Deficits / Details: AROM shoulder flexion to approx. 70 degrees, PROM shoulder flexion WFL; pt reports fall on Right shoulder earlier this year with  decreased ROM since that time; pt reports no pain in shoulder   Lower Extremity Assessment Lower Extremity Assessment: Defer to PT evaluation   Cervical / Trunk Assessment Cervical / Trunk Assessment: Kyphotic   Communication Communication Communication: Impaired Factors Affecting Communication: Reduced clarity of speech;Other (comment) (Very soft voice and difficulty with speaking clearly due to Bilateral vocal fold paralysis)   Cognition Arousal: Alert Behavior During Therapy: WFL for tasks assessed/performed Cognition: Difficult to assess Difficult to assess due to: Impaired communication (Very soft voice and difficulty with speaking clearly due to Bilateral vocal fold paralysis)                             Following commands: Intact       Cueing  General Comments   Cueing Techniques: Verbal cues;Gestural cues;Tactile cues;Visual cues  VSS even when pt perceives some SOB or when pt sounds like she is struggling to breathe with O2 sat consistently >/97% on 3L continuous O2 through nasal cannula.   Exercises     Shoulder Instructions      Home Living Family/patient expects to be discharged to:: Private residence Living Arrangements: Alone Available Help at Discharge: Family;Available PRN/intermittently (very intermittently) Type of Home: Apartment Home Access: Stairs to enter [employer] of Steps: flight Entrance Stairs-Rails: Right;Left Home Layout: One level     Bathroom Shower/Tub: Chief Strategy Officer: Standard Bathroom Accessibility: Yes How Accessible: Accessible via walker Home Equipment: Cane - single point;Shower seat;Hand held shower head          Prior Functioning/Environment Prior Level of Function : Independent/Modified Independent             Mobility Comments: reports using cane and having difficulty with getting up the flight of steps due to SoB ADLs Comments: independent to Mod I, but having  significant difficulty  due to SOB    OT Problem List: Decreased strength;Decreased activity tolerance;Impaired balance (sitting and/or standing);Decreased knowledge of use of DME or AE;Decreased knowledge of precautions;Cardiopulmonary status limiting activity   OT Treatment/Interventions: Self-care/ADL training;Therapeutic exercise;Energy conservation;DME and/or AE instruction;Therapeutic activities;Patient/family education;Balance training      OT Goals(Current goals can be found in the care plan section)   Acute Rehab OT Goals Patient Stated Goal: to feel better and return home OT Goal Formulation: With patient Time For Goal Achievement: 10/07/23 Potential to Achieve Goals: Fair ADL Goals Pt Will Perform Upper Body Bathing: with contact guard assist;sitting Pt Will Perform Lower Body Bathing: with min assist;sitting/lateral leans;sit to/from stand Pt Will Perform Lower Body Dressing: with min assist;sitting/lateral leans;sit to/from stand Pt Will Transfer to Toilet: with contact guard assist;ambulating;bedside commode (with least restrictive AD) Pt Will Perform Toileting - Clothing Manipulation and hygiene: with min assist;sitting/lateral leans;sit to/from stand Pt/caregiver will Perform Home Exercise Program: Increased strength;Both right and left upper extremity;With minimal assist;With written HEP provided (AAROM/AROM)   OT Frequency:  Min 2X/week    Co-evaluation PT/OT/SLP Co-Evaluation/Treatment: Yes Reason for Co-Treatment: For patient/therapist safety;To address functional/ADL transfers PT goals addressed during session: Mobility/safety with mobility OT goals addressed during session: ADL's and self-care;Strengthening/ROM      AM-PAC OT 6 Clicks Daily Activity     Outcome Measure Help from another person eating meals?: Total (NPO/Cortak) Help from another person taking care of personal grooming?: A Little Help from another person toileting, which includes using  toliet, bedpan, or urinal?: A Lot Help from another person bathing (including washing, rinsing, drying)?: A Lot Help from another person to put on and taking off regular upper body clothing?: A Lot Help from another person to put on and taking off regular lower body clothing?: A Lot 6 Click Score: 12   End of Session Equipment Utilized During Treatment: Oxygen  Nurse Communication: Mobility status;Other (comment) (Pt participated well but limited by decreased activity tolerance, fatigue, and impaired cardiopulmonary status)  Activity Tolerance: Patient tolerated treatment well;Patient limited by fatigue;Treatment limited secondary to medical complications (Comment) (Pt limited by impaired cardiopulmonary status) Patient left: in bed;with call bell/phone within reach;with bed alarm set  OT Visit Diagnosis: Unsteadiness on feet (R26.81);Other abnormalities of gait and mobility (R26.89);Muscle weakness (generalized) (M62.81);Other (comment) (decreased activity tolerance)                Time: 1510-1539 OT Time Calculation (min): 29 min Charges:  OT General Charges $OT Visit: 1 Visit OT Evaluation $OT Eval Moderate Complexity: 1 Mod  Ronnell Coins., OTR/L, MA Acute Rehab [phone]   [name] 09/23/2023, 5:29 PM

## 2023-09-23 NOTE — Plan of Care (Signed)
  Problem: Clinical Measurements: Goal: Diagnostic test results will improve Outcome: Progressing Goal: Respiratory complications will improve Outcome: Progressing   Problem: Activity: Goal: Risk for activity intolerance will decrease Outcome: Progressing   

## 2023-09-23 NOTE — Progress Notes (Signed)
 OT Cancellation Note  Patient Details Name: Elizabeth Mayer MRN: [id number] DOB: 1961-01-03   Cancelled Treatment:    Reason Eval/Treat Not Completed: Patient at procedure or test/ unavailable (Pt currently off unit for thoracentesis. OT to reattempt to see pt for acute OT eval at a later time as appropriate/available.)  Ronnell Coins., OTR/L, MA Acute Rehab [phone]  [name] 09/23/2023, 11:24 AM

## 2023-09-23 NOTE — Progress Notes (Addendum)
 TR[facility] ROUNDING NOTE Elizabeth Mayer WNU:[id number]  DOB: 1960/09/14  DOA: 09/19/2023  PCP: Elizabeth Epley, FNP  09/23/2023,2:01 PM  LOS: 4 days    Code Status: full code   From:   home   Current Dispo: unclear   63 year old female COPD, asthma Stage IV adenocarcinoma lung--previous maintenance pemetrexed /pembrolizumab  [treatment holiday since 2023 discontinued carboplatin --had second opinion at [facility]]with brain mets prior SRS to brain mets 2021--followed also by Elizabeth Mayer  prior postobstructive pneumonia Recent diagnosis vocal cord paralysis ENT [facility] Dr. Avanell Bob Mayer--was scheduled for elective tracheostomy Chronic respiratory failure 2 to 3 L, former smoker  Admitted 6/15 with shortness of breath and found to be in A-fib with RVR new onset Workup revealed pneumonia on chest x-ray Sodium 144 potassium 5.4 BUN/creatinine 26/0.9 (baseline 16/0.8) AST 48 ALT 27 bili 1.9 BNP 123 troponin 57  lactic acid 2.0 WBC 12.8 hemoglobin 13-COVID flu RSV all negative-blood cultures collected negative--- extended RVP negative  Felt to have Dysphagia contributing from her nerve paralysis from the brain mass  6/16 MRI brain enhancing mass along right cerebellopontine cistern suspicious for metastasis moderate mass effect brainstem right cerebellar hemisphere-previous supratentorial masses are decreased in size 6/17 more lethargic transferred to ICU placed on BiPAP--palliative medicine consulted 6/19 transferred to [facility]--thoracentesis 300 cc performed right pleural space  Plan  Aspiration pneumonia secondary to dysphagia from vocal cord paralysis Continues Unasyn--strep pneumo Legionella never collected reordered Thoracentesis performed--awaiting diagnostic markers  A-fib RVR on admission Continue metoprolol 12.5 twice daily per tube, can use IV metoprolol 5 mg every 4 as needed  Probable COPD Continues on Solu-Medrol 60 IV daily--will transition to oral steroids when able Continue Breztri  2 puffs twice daily  inhalation Robitussin 20 3 times daily levalbuterol 1.25Q4 as needed saline nasal spray and Tessalon 200 3 times daily  Mild hypernatremia Monitor trends-resume D5 water 75 cc/H, start free water 200 every 4, supplement as able with Osmolite 45 cc/H Prosource 60 daily  Stage IV NSCLC Pulmonology discussed with radiation oncologist Elizabeth Mayer and Elizabeth Mayer neuro oncologist-this is treatable with radiation therapy but she would need adequate nutrition and predominantly this would be as an outpatient Patient undeciding about tracheostomy PEG tube --appreicative of ENT discussion seems patient is in denial--- does not wish to do above procedures once only the radiation asks about being able to eat  Protein energy malnutrition from cancer Feeds and treatment as above  I discussed case with palliative medicine-patient now requesting to hear from Elizabeth Mayer ENT personally, does not believe that she has cancer, does not believe that she needs tracheostomy-this doctor had previously had discussion with attending 6/17 regarding needs etc. I asked if patient would be willing to discuss with Elizabeth Mayer and she says she does not want to see him for unspecified reasons-note that patient had been seen previously by Elizabeth Mayer prior to Elizabeth Mayer I have mentioned to the patient that option of eating is risky at best and lethal at worst-if she persists with this decision which is not a good option medically, and if she refuses hospice and palliative approach, it would be very risky to discharge her home but that may be unfortunately our only option as she does not seem to will attempt to discuss case with [name] ENT Let's see if patient changes her mind   DVT prophylaxis: Lovenox  Status is: Inpatient Remains inpatient appropriate because:   Requires further management   Subjective: Very hoarse voice-very difficult to understand--but with slowing of  her speech No fever no chills  Objective +  exam Vitals:   09/23/23 1120 09/23/23 1130 09/23/23 1140 09/23/23 1159  BP:   130/67 139/73  Pulse: 75 75 75 77  Resp: 11  11 20   Temp:    (!) 97.4 F (36.3 C)  TempSrc:    Oral  SpO2: 99% 100% 100% 100%  Weight:      Height:       Filed Weights   09/21/23 0500 09/23/23 0500 09/23/23 0941  Weight: 49.9 kg 49.5 kg 49.5 kg    Examination:  Cachectic black female EOMI NCAT drying up of the left side of the mouth with some droop on the right Decreased air entry posterolaterally S1-S2 no murmur ROM is intact moving 4 limbs equally Power 5/5  Data Reviewed: reviewed   CBC    Component Value Date/Time   WBC 11.8 (H) 09/23/2023 0420   RBC 3.71 (L) 09/23/2023 0420   HGB 11.1 (L) 09/23/2023 0420   HGB 11.7 07/26/2023 1254   HCT 37.0 09/23/2023 0420   HCT 36.4 07/26/2023 1254   PLT 244 09/23/2023 0420   PLT 166 07/26/2023 1254   MCV 99.7 09/23/2023 0420   MCV 92 07/26/2023 1254   MCH 29.9 09/23/2023 0420   MCHC 30.0 09/23/2023 0420   RDW 13.0 09/23/2023 0420   RDW 13.5 07/26/2023 1254   LYMPHSABS 0.5 (L) 09/23/2023 0420   LYMPHSABS 1.2 07/26/2023 1254   MONOABS 0.6 09/23/2023 0420   EOSABS 0.0 09/23/2023 0420   EOSABS 0.1 07/26/2023 1254   BASOSABS 0.0 09/23/2023 0420   BASOSABS 0.0 07/26/2023 1254      Latest Ref Rng & Units 09/23/2023    4:20 AM 09/22/2023    5:27 AM 09/21/2023    5:12 AM  CMP  Glucose 70 - 99 mg/dL 956  213  90   BUN 8 - 23 mg/dL 17  17  13    Creatinine 0.44 - 1.00 mg/dL 0.86  5.78  4.69   Sodium 135 - 145 mmol/L 145  147  145   Potassium 3.5 - 5.1 mmol/L 3.5  3.4  4.3   Chloride 98 - 111 mmol/L 101  103  105   CO2 22 - 32 mmol/L 41  37  27   Calcium  8.9 - 10.3 mg/dL 8.6  8.9  8.5     Scheduled Meds:  budesonide -glycopyrrolate-formoterol   2 puff Inhalation BID   Chlorhexidine  Gluconate Cloth  6 each Topical Daily   enoxaparin (LOVENOX) injection  40 mg Subcutaneous Q24H   feeding supplement (PROSource TF20)  60 mL Per Tube Daily   free  water  200 mL Per Tube Q4H   guaiFENesin  20 mL Per Tube TID   methylPREDNISolone (SOLU-MEDROL) injection  60 mg Intravenous Daily   metoprolol tartrate  12.5 mg Per Tube BID   multivitamin with minerals  1 tablet Per Tube Daily   thiamine (VITAMIN B1) injection  100 mg Intravenous Daily   Continuous Infusions:  ampicillin-sulbactam (UNASYN) IV 3 g (09/23/23 1205)   dextrose  75 mL/hr at 09/23/23 1511   feeding supplement (OSMOLITE 1.5 CAL) 25 mL/hr at 09/23/23 1209    Time  44  Verlie Glisson, MD  [facility]

## 2023-09-24 ENCOUNTER — Inpatient Hospital Stay (HOSPITAL_COMMUNITY)

## 2023-09-24 ENCOUNTER — Other Ambulatory Visit: Payer: Self-pay | Admitting: Radiation Therapy

## 2023-09-24 DIAGNOSIS — Z515 Encounter for palliative care: Secondary | ICD-10-CM | POA: Diagnosis not present

## 2023-09-24 DIAGNOSIS — Z7189 Other specified counseling: Secondary | ICD-10-CM | POA: Diagnosis not present

## 2023-09-24 DIAGNOSIS — J189 Pneumonia, unspecified organism: Secondary | ICD-10-CM | POA: Diagnosis not present

## 2023-09-24 DIAGNOSIS — R1310 Dysphagia, unspecified: Secondary | ICD-10-CM

## 2023-09-24 DIAGNOSIS — C7931 Secondary malignant neoplasm of brain: Secondary | ICD-10-CM | POA: Diagnosis not present

## 2023-09-24 DIAGNOSIS — J3800 Paralysis of vocal cords and larynx, unspecified: Secondary | ICD-10-CM | POA: Diagnosis not present

## 2023-09-24 DIAGNOSIS — J9621 Acute and chronic respiratory failure with hypoxia: Secondary | ICD-10-CM | POA: Diagnosis not present

## 2023-09-24 DIAGNOSIS — C3490 Malignant neoplasm of unspecified part of unspecified bronchus or lung: Secondary | ICD-10-CM | POA: Diagnosis not present

## 2023-09-24 LAB — CULTURE, BLOOD (ROUTINE X 2)
Culture: NO GROWTH
Culture: NO GROWTH
Special Requests: ADEQUATE
Special Requests: ADEQUATE

## 2023-09-24 LAB — CBC WITH DIFFERENTIAL/PLATELET
Abs Immature Granulocytes: 0.04 10*3/uL (ref 0.00–0.07)
Basophils Absolute: 0 10*3/uL (ref 0.0–0.1)
Basophils Relative: 0 %
Eosinophils Absolute: 0 10*3/uL (ref 0.0–0.5)
Eosinophils Relative: 0 %
HCT: 35.3 % — ABNORMAL LOW (ref 36.0–46.0)
Hemoglobin: 10.6 g/dL — ABNORMAL LOW (ref 12.0–15.0)
Immature Granulocytes: 0 %
Lymphocytes Relative: 5 %
Lymphs Abs: 0.6 10*3/uL — ABNORMAL LOW (ref 0.7–4.0)
MCH: 29.9 pg (ref 26.0–34.0)
MCHC: 30 g/dL (ref 30.0–36.0)
MCV: 99.7 fL (ref 80.0–100.0)
Monocytes Absolute: 0.7 10*3/uL (ref 0.1–1.0)
Monocytes Relative: 7 %
Neutro Abs: 8.8 10*3/uL — ABNORMAL HIGH (ref 1.7–7.7)
Neutrophils Relative %: 88 %
Platelets: 236 10*3/uL (ref 150–400)
RBC: 3.54 MIL/uL — ABNORMAL LOW (ref 3.87–5.11)
RDW: 12.6 % (ref 11.5–15.5)
WBC: 10.1 10*3/uL (ref 4.0–10.5)
nRBC: 0 % (ref 0.0–0.2)

## 2023-09-24 LAB — BASIC METABOLIC PANEL WITH GFR
Anion gap: 7 (ref 5–15)
Anion gap: 7 (ref 5–15)
BUN: 17 mg/dL (ref 8–23)
BUN: 17 mg/dL (ref 8–23)
CO2: 37 mmol/L — ABNORMAL HIGH (ref 22–32)
CO2: 40 mmol/L — ABNORMAL HIGH (ref 22–32)
Calcium: 8.2 mg/dL — ABNORMAL LOW (ref 8.9–10.3)
Calcium: 8.7 mg/dL — ABNORMAL LOW (ref 8.9–10.3)
Chloride: 100 mmol/L (ref 98–111)
Chloride: 97 mmol/L — ABNORMAL LOW (ref 98–111)
Creatinine, Ser: 0.61 mg/dL (ref 0.44–1.00)
Creatinine, Ser: 0.58 mg/dL (ref 0.44–1.00)
GFR, Estimated: >60 mL/min (ref >60)
GFR, Estimated: >60 mL/min (ref >60)
Glucose, Bld: 158 mg/dL — ABNORMAL HIGH (ref 70–99)
Glucose, Bld: 103 mg/dL — ABNORMAL HIGH (ref 70–99)
Potassium: 3.5 mmol/L (ref 3.5–5.1)
Potassium: 3.6 mmol/L (ref 3.5–5.1)
Sodium: 144 mmol/L (ref 135–145)
Sodium: 144 mmol/L (ref 135–145)

## 2023-09-24 LAB — GLUCOSE, CAPILLARY
Glucose-Capillary: 109 mg/dL — ABNORMAL HIGH (ref 70–99)
Glucose-Capillary: 167 mg/dL — ABNORMAL HIGH (ref 70–99)
Glucose-Capillary: 185 mg/dL — ABNORMAL HIGH (ref 70–99)
Glucose-Capillary: 98 mg/dL (ref 70–99)

## 2023-09-24 LAB — MAGNESIUM: Magnesium: 1.7 mg/dL (ref 1.7–2.4)

## 2023-09-24 LAB — CYTOLOGY - NON PAP

## 2023-09-24 LAB — C-REACTIVE PROTEIN: CRP: 3.4 mg/dL — ABNORMAL HIGH (ref <1.0)

## 2023-09-24 LAB — AMMONIA: Ammonia: 34 umol/L (ref 9–35)

## 2023-09-24 LAB — BRAIN NATRIURETIC PEPTIDE: B Natriuretic Peptide: 160.4 pg/mL — ABNORMAL HIGH (ref 0.0–100.0)

## 2023-09-24 LAB — PHOSPHORUS: Phosphorus: 2.1 mg/dL — ABNORMAL LOW (ref 2.5–4.6)

## 2023-09-24 LAB — PROCALCITONIN: Procalcitonin: 0.52 ng/mL

## 2023-09-24 MED ORDER — METHYLPREDNISOLONE 4 MG PO TBPK: 4.0000 mg | ORAL_TABLET | Freq: Three times a day (TID) | ORAL | Status: DC

## 2023-09-24 MED ORDER — METHYLPREDNISOLONE 4 MG PO TBPK
4.0000 mg | ORAL_TABLET | Freq: Three times a day (TID) | ORAL | Status: AC
  Administered 2023-09-25 (×3): 4 mg

## 2023-09-24 MED ORDER — METHYLPREDNISOLONE 4 MG PO TBPK
8.0000 mg | ORAL_TABLET | Freq: Every evening | ORAL | Status: AC
  Administered 2023-09-24: 8 mg via ORAL

## 2023-09-24 MED ORDER — METHYLPREDNISOLONE 4 MG PO TBPK
4.0000 mg | ORAL_TABLET | ORAL | Status: AC
  Administered 2023-09-24: 4 mg via ORAL

## 2023-09-24 MED ORDER — METHYLPREDNISOLONE 4 MG PO TBPK
4.0000 mg | ORAL_TABLET | Freq: Four times a day (QID) | ORAL | Status: DC
  Administered 2023-09-26 – 2023-09-27 (×7): 4 mg

## 2023-09-24 MED ORDER — LOPERAMIDE HCL 2 MG PO CAPS
2.0000 mg | ORAL_CAPSULE | Freq: Two times a day (BID) | ORAL | Status: DC
  Administered 2023-09-24 (×2): 2 mg via ORAL
  Filled 2023-09-24 (×2): qty 1

## 2023-09-24 MED ORDER — METHYLPREDNISOLONE 4 MG PO TBPK
8.0000 mg | ORAL_TABLET | Freq: Every evening | ORAL | Status: AC
  Administered 2023-09-25: 8 mg

## 2023-09-24 MED ORDER — ACETAZOLAMIDE 250 MG PO TABS
500.0000 mg | ORAL_TABLET | Freq: Two times a day (BID) | ORAL | Status: DC
  Administered 2023-09-24 – 2023-09-28 (×8): 500 mg
  Filled 2023-09-24 (×9): qty 2

## 2023-09-24 MED ORDER — METHYLPREDNISOLONE 4 MG PO TBPK: 8.0000 mg | ORAL_TABLET | Freq: Every evening | ORAL | Status: DC

## 2023-09-24 MED ORDER — ACETAZOLAMIDE 250 MG PO TABS
500.0000 mg | ORAL_TABLET | Freq: Two times a day (BID) | ORAL | Status: DC
  Filled 2023-09-24: qty 2

## 2023-09-24 MED ORDER — PREDNISONE 20 MG PO TABS
60.0000 mg | ORAL_TABLET | Freq: Every day | ORAL | Status: DC
  Filled 2023-09-24: qty 3

## 2023-09-24 MED ORDER — LOPERAMIDE HCL 1 MG/7.5ML PO SUSP
2.0000 mg | Freq: Two times a day (BID) | ORAL | Status: DC
  Administered 2023-09-25 – 2023-09-26 (×3): 2 mg
  Filled 2023-09-24 (×4): qty 15

## 2023-09-24 MED ORDER — METHYLPREDNISOLONE 4 MG PO TBPK: 4.0000 mg | ORAL_TABLET | Freq: Four times a day (QID) | ORAL | Status: DC

## 2023-09-24 MED ORDER — METHYLPREDNISOLONE 4 MG PO TBPK
8.0000 mg | ORAL_TABLET | Freq: Every morning | ORAL | Status: AC
  Administered 2023-09-24: 8 mg via ORAL
  Filled 2023-09-24 (×2): qty 21
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
## 2023-09-24 NOTE — Plan of Care (Signed)
?  Problem: Health Behavior/Discharge Planning: ?Goal: Ability to manage health-related needs will improve ?Outcome: Not Progressing ?  ?Problem: Clinical Measurements: ?Goal: Diagnostic test results will improve ?Outcome: Not Progressing ?  ?

## 2023-09-24 NOTE — Progress Notes (Signed)
 Notified by CCMD, patient's HR dropped to 47. Patient sleeping at the time. Asymptomatic.

## 2023-09-24 NOTE — Progress Notes (Signed)
 NAME:  Elizabeth Mayer, MRN:  [id number], DOB:  1960/05/27, LOS: 5 ADMISSION DATE:  09/19/2023, CONSULTATION DATE: 09/21/2023 REFERRING MD: [facility], CHIEF COMPLAINT: Acute on chronic respiratory distress  History of Present Illness:   63 year old with COPD, paroxysmal atrial fibrillation not on anticoagulation, chronic hypoxic respiratory failure on 2 L oxygen , non-small cell lung cancer with mets to the brain admitted on 6/15 with severe sepsis due to currently acquired pneumonia, hypoxic, hypercarbic respiratory failure.  PCCM called today for worsening respiratory failure.  Pertinent  Medical History    has a past medical history of Anemia, Angina (1982), Asthma, Atrial fibrillation (HCC), Complication of anesthesia, Constipation, COPD (chronic obstructive pulmonary disease) (HCC), Diabetes mellitus without complication (HCC), Dyspnea, Dysrhythmia, Fall (06/01/2023), Fatigue, GERD (gastroesophageal reflux disease), Heart murmur (1970s), Hypertension, Ingrown toenail, Lung cancer (HCC) (10/2019), On home oxygen  therapy, Past heart attack (4098-1191), Peripheral vascular disease (HCC), and Pneumonia.    Significant Hospital Events: Including procedures, antibiotic start and stop dates in addition to other pertinent events   6/15 admitted for acute hypoxic respiratory failure 6/17 transfer to ICU 6/18 transfer out of ICU 6/19 right-sided thoracentesis with removal of 300 cc of straw-colored fluid  Interim History / Subjective:   Remained stable.  No new complaints  Objective    Blood pressure 110/60, pulse 77, temperature (!) 97.4 F (36.3 C), temperature source Oral, resp. rate (!) 25, height 5' 5 (1.651 m), weight 49.8 kg, SpO2 97%.        Intake/Output Summary (Last 24 hours) at 09/24/2023 0947 Last data filed at 09/24/2023 0135 Gross per 24 hour  Intake 2087.88 ml  Output --  Net 2087.88 ml   Filed Weights   09/23/23 0500 09/23/23 0941 09/24/23 0500  Weight: 49.5 kg 49.5 kg  49.8 kg    Examination: Gen:      No acute distress, frail, cachectic HEENT:  EOMI, sclera anicteric Neck:     No masses; no thyromegaly Lungs:    Diminished breath sounds on the right CV:         Regular rate and rhythm; no murmurs Abd:      + bowel sounds; soft, non-tender; no palpable masses, no distension Ext:    No edema; adequate peripheral perfusion Neuro: Left facial droop, dysarthric voice, inspiratory stridor  Lab/imaging reviewed CT chest with moderate right effusion, right middle lobe, lower lobe collapse CT neck with post treatment changes and no acute process.  Pleural studies  LDH 89, total protein less than 3 Nucleated cells 492, 50% lymphs, 36% monocyte macrophage, 14% neutrophils Microbiology and cytology are pending  Resolved problem list   Assessment and Plan  Severe sepsis secondary to community-acquired pneumonia, present on admission Hypoxic, hypercarbic respiratory failure Dysphagia, severe aspiration Continue Unasyn Follow urine streptococcal antigen, Legionella Dysphagia is likely secondary to nerve paralysis from brain mass.  CT chest and CT neck with contrast does not show any other alternate pathology to explain dysphagia. Not sure if her right lower lobe opacity is due to recurrence of cancer with postobstructive pneumonia.   Follow pleural fluid cultures, cytology to completion   Vocal cord paralysis Follows with ENT at [facility].     A-fib with RVR Telemetry monitoring Continue metoprolol   COPD Stage IV non-small cell lung cancer with recurrent metastasis Continue Breztri , prednisone    Severe protein calorie malnutrition, present on admission Severe dysphagia Nutrition following.   Corpak placed yesterday and initiated tube feeds Monitor for refeeding syndrome, replete electrolytes IV thiamine for  5 days  Goals of care MRI shows new cerebellopontine angle mass suspicious for recurrent malignancy which is causing her facial nerve  and vocal cord paralysis.  I have discussed these findings with her outpatient oncology team Dr. Josphine Nims and [name].  The new lesion is treatable by radiotherapy if she can recover to discharge.   She will likely need tracheostomy and PEG tube for vocal cord paralysis and severe dysphagia, severe malnutrition if she were to recover to be able to get radiotherapy.  All this was discussed with the patient and code status discussed.  Patient is in denial about her cancer recurrence and wants to think some more before deciding on goals of care.  She remains full code for now.  Palliative care is on board  PCCM will be available as needed.  Please call with any questions.  Best Practice (right click and Reselect all SmartList Selections daily)   Per primary team  Signature:   Ridhima Golberg MD [facility] See Amion for pager  If no response to pager , please call [phone] until 7pm After 7:00 pm call Elink  [phone] 09/24/2023, 9:47 AM

## 2023-09-24 NOTE — Progress Notes (Signed)
 Speech Language Pathology Treatment: Dysphagia  Patient Details Name: Elizabeth Mayer MRN: [id number] DOB: 1960/04/23 Today's Date: 09/24/2023 Time: 1093-2355 SLP Time Calculation (min) (ACUTE ONLY): 12 min  Assessment / Plan / Recommendation Clinical Impression  Pt continues to demonstrates severe dysphagia, but respiratory function more stable today and pt able to participate in session with more control. SLP avoided any discussion about severity of dysphagia or outcomes. Just observed pt trying to drink. Pt given ice with difficulty manipulating ice orally. There are multiple swallows follow by dysphonic cough and inspiratory stridor. Pt preferred to sip from a straw placed deep to the left side of mouth. Pt took very small sips with multiple swallow and throat clearing. Talked to pt about proceeding with the MBS that her prior ENT had recommended to assess her swallowing. Pt says they told me I failed that. Pt has not had any instrumental testing to this date, but was scheduled for an OP MBS on the day of admit. She is now medically stable enough to participate and eval may help her make decisions about plan of care and how to proceed with oral intake with known risk if that is her wish. Pt in agreement with plan   HPI HPI: Pt is a 63 year old female who is admitted to [facility] on 09/19/2023 with severe sepsis due to community-acquired pneumonia after presenting from home to [facility] ED complaining of shortness of breath.  Pt initially reported hearing and swallowing changesto ENT on 08/26/23.  Right facial nerve paralysis documented as new that date as well. Pt stated at that time that her voice had been very bad for two weeks prior to appointment but also had been reporting voice changes since radiation for metastatic lung cancer in 2020 and ENT scoped pt and diagnosed with left vocal cord immobility since 2020, but now NEW bilateral vocal cord paralysis on strobeoscopy with a 2-3 mm glottic gap,  right sensorineural hearing loss and new right facial nerve paralysis. Pt was scheduled for a CT neck, an MBS (that was scheduled in [place] for 6/16 - day of admission to [facility] so not completed), and strict precautions for worsening airway stridor with concern for potential need for trach. PMH additionally significant for COPD, paroxysmal atrial fibrillation not on chronic anticoagulation, chronic hypoxic respiratory failure on 2 to 3 L continuous nasal cannula, non-small cell lung cancer with history of metastatic disease to the brain.      SLP Plan  MBS          Recommendations                                 MBS     Elizabeth Mayer, Elizabeth Mayer  09/24/2023, 11:07 AM

## 2023-09-24 NOTE — Progress Notes (Addendum)
 [facility] ROUNDING NOTE Elizabeth Mayer UJW:[id number]  DOB: 19-Jul-1960  DOA: 09/19/2023  PCP: [name], FNP  09/24/2023,3:20 PM  LOS: 5 days    Code Status: full code   From:   home   Current Dispo: unclear   63 year old female COPD, asthma Stage IV adenocarcinoma lung--previous maintenance pemetrexed /pembrolizumab  [treatment holiday since 2023 discontinued carboplatin --had second opinion at [facility]]with brain mets prior SRS to brain mets 2021--followed also by [name]  prior postobstructive pneumonia Recent diagnosis vocal cord paralysis ENT [facility] [name]--was scheduled for elective tracheostomy Chronic respiratory failure 2 to 3 L, former smoker  Admitted 6/15 with shortness of breath and found to be in A-fib with RVR new onset Workup revealed pneumonia on chest x-ray Sodium 144 potassium 5.4 BUN/creatinine 26/0.9 (baseline 16/0.8) AST 48 ALT 27 bili 1.9 BNP 123 troponin 57  lactic acid 2.0 WBC 12.8 hemoglobin 13-COVID flu RSV all negative-blood cultures collected negative--- extended RVP negative  Felt to have Dysphagia contributing from her nerve paralysis from the brain mass  6/16 MRI brain enhancing mass along right cerebellopontine cistern suspicious for metastasis moderate mass effect brainstem right cerebellar hemisphere-previous supratentorial masses are decreased in size 6/17 more lethargic transferred to ICU placed on BiPAP--palliative medicine consulted 6/19 transferred to [facility]--thoracentesis 300 cc performed right pleural space  Plan  Aspiration pneumonia secondary to dysphagia from vocal cord paralysis Failed MBS with severe oral dysphagia secondary to hypoglossal facial impairment and cough is nonproductive -considering water swallow in the next several days for comfort, but not today Continues Unasyn--strep pneumo Legionella never collected   Pleural effusion?  Postobstructive?  Related to malignancy Thoracentesis performed--Gram stain negative, 14% neutrophils 50%  lymphocytes 36% monocytes Prelim cytology negative only a few reactive mesothelial cells Treating as if infected would complete Unasyn  New paroxysmal  A-fib RVR on admission, Chad2vasc2> 4 Controlled on metoprolol 12.5 twice daily per tube, can use IV metoprolol 5 mg every 4 as needed ---poor candidate candidate for anticoagulation with risk of bleed given the metastases in the brain  Probable COPD Transition to prednisone  60 from Solu-Medrol Continue Breztri  2 puffs twice daily inhalation Robitussin 20 3 times daily levalbuterol 1.25Q4 as needed saline nasal spray and Tessalon 200 3 times daily  Mild hypernatremia--metabolic alkalosis probably compensatory Hyponatremia resolved, free water 200 Q4--monitor Start Diamox 500 twice daily  Stage IV NSCLC Pulmonology discussed with radiation oncologist Dr. Arthea Bilis and [name] neuro oncologist-this is treatable with radiation therapy but she would need adequate nutrition and predominantly this would be as an outpatient Patient undeciding about tracheostomy PEG tube --ENT [name] saw and is willing to perform trach on 6/23   Protein energy malnutrition from cancer Feeds and treatment as above  Palliative medicine involved in care-ENT involved pulmonology signed off Patient at very high risk for decompensation if takes p.o. -if decides against PEG and trach however and expresses her option to go home to eat-she is at high risk for aspiration recurrently and death  she still thinks she can go home on a soft diet  She remains in denial.  Palliatve to re-discuss polans --she says she wants to think about things  [when asked re Trach-PEg]    DVT prophylaxis: Lovenox  Status is: Inpatient Remains inpatient appropriate because:   Requires further management   Subjective:  Coarse breath sounds-events noted with speech therapy and their input appreciated She is coughing but does not bring up sputum  Objective + exam Vitals:    09/24/23 0424 09/24/23  0500 09/24/23 0811 09/24/23 1136  BP: 120/66  110/60 116/76  Pulse: 68  77 79  Resp: 17  (!) 25 17  Temp: 98.2 F (36.8 C)  (!) 97.4 F (36.3 C) (!) 97.4 F (36.3 C)  TempSrc: Oral  Oral Oral  SpO2: 100%  97% 94%  Weight:  49.8 kg    Height:       Filed Weights   09/23/23 0500 09/23/23 0941 09/24/23 0500  Weight: 49.5 kg 49.5 kg 49.8 kg    Examination:  Cachectic black female Droop on left side of face Raspy respirations Core track in place S1-S2 no murmur Abdomen soft no rebound no guarding   Data Reviewed: reviewed   CBC    Component Value Date/Time   WBC 10.1 09/24/2023 0445   RBC 3.54 (L) 09/24/2023 0445   HGB 10.6 (L) 09/24/2023 0445   HGB 11.7 07/26/2023 1254   HCT 35.3 (L) 09/24/2023 0445   HCT 36.4 07/26/2023 1254   PLT 236 09/24/2023 0445   PLT 166 07/26/2023 1254   MCV 99.7 09/24/2023 0445   MCV 92 07/26/2023 1254   MCH 29.9 09/24/2023 0445   MCHC 30.0 09/24/2023 0445   RDW 12.6 09/24/2023 0445   RDW 13.5 07/26/2023 1254   LYMPHSABS 0.6 (L) 09/24/2023 0445   LYMPHSABS 1.2 07/26/2023 1254   MONOABS 0.7 09/24/2023 0445   EOSABS 0.0 09/24/2023 0445   EOSABS 0.1 07/26/2023 1254   BASOSABS 0.0 09/24/2023 0445   BASOSABS 0.0 07/26/2023 1254      Latest Ref Rng & Units 09/24/2023    4:45 AM 09/24/2023    1:20 AM 09/23/2023    4:20 AM  CMP  Glucose 70 - 99 mg/dL 161  096  045   BUN 8 - 23 mg/dL 17  17  17    Creatinine 0.44 - 1.00 mg/dL 4.09  8.11  9.14   Sodium 135 - 145 mmol/L 144  144  145   Potassium 3.5 - 5.1 mmol/L 3.6  3.5  3.5   Chloride 98 - 111 mmol/L 97  100  101   CO2 22 - 32 mmol/L 40  37  41   Calcium  8.9 - 10.3 mg/dL 8.7  8.2  8.6     Scheduled Meds:  budesonide -glycopyrrolate-formoterol   2 puff Inhalation BID   Chlorhexidine  Gluconate Cloth  6 each Topical Daily   enoxaparin (LOVENOX) injection  40 mg Subcutaneous Q24H   feeding supplement (PROSource TF20)  60 mL Per Tube Daily   free water  200 mL Per  Tube Q4H   guaiFENesin  20 mL Per Tube TID   loperamide  2 mg Oral BID   methylPREDNISolone (SOLU-MEDROL) injection  60 mg Intravenous Daily   metoprolol tartrate  12.5 mg Per Tube BID   multivitamin with minerals  1 tablet Per Tube Daily   thiamine (VITAMIN B1) injection  100 mg Intravenous Daily   Continuous Infusions:  ampicillin-sulbactam (UNASYN) IV 3 g (09/24/23 1326)   feeding supplement (OSMOLITE 1.5 CAL) 35 mL/hr at 09/23/23 1729    Time  34  Verlie Glisson, MD  [facility]

## 2023-09-24 NOTE — Progress Notes (Signed)
 Modified Barium Swallow Study  Patient Details  Name: Elizabeth Mayer MRN: [id number] Date of Birth: 30-Sep-1960  Today's Date: 09/24/2023  Modified Barium Swallow completed.  Full report located under Chart Review in the Imaging Section.  History of Present Illness Pt is a 63 year old female who is admitted to [facility] on 09/19/2023 with severe sepsis due to community-acquired pneumonia after presenting from home to [facility] ED complaining of shortness of breath.  Pt initially reported hearing and swallowing changesto ENT on 08/26/23.  Right facial nerve paralysis documented as new that date as well. Pt stated at that time that her voice had been very bad for two weeks prior to appointment but also had been reporting voice changes since radiation for metastatic lung cancer in 2020 and ENT scoped pt and diagnosed with left vocal cord immobility since 2020, but now NEW bilateral vocal cord paralysis on strobeoscopy with a 2-3 mm glottic gap, right sensorineural hearing loss and new right facial nerve paralysis. Pt was scheduled for a CT neck, an MBS (that was scheduled in [place] for 6/16 - day of admission to [facility] so not completed), and strict precautions for worsening airway stridor with concern for potential need for trach. PMH additionally significant for COPD, paroxysmal atrial fibrillation not on chronic anticoagulation, chronic hypoxic respiratory failure on 2 to 3 L continuous nasal cannula, non-small cell lung cancer with history of metastatic disease to the brain.   Clinical Impression Pt demonstrates a profound and complex dysphagia. SLP mostly let pt use her own methods to eat and drink initially to evaluate her natural compensations. Throughout pt denies that she is having any trouble.   Pt has a severe oral dysphagia due to right hypoglossal and facial nerve impairments. Pt can only achieve weak negative pressure on a straw placed deep in the left buccal cavity. There are small  repetitive lingual pumps to transit a small bolus, pt uses a slight posterior head tilt to transit. Cannot attempt chin tuck. Struggles with any positional changes ROM is poor and position for straw has to be quite precise.   Liquids spill to airway and pyriforms with aspiration before the swallow and inconsistent cough or throat clear. There is decreased seal of velum on posterior pharyngeal wall, decreased movement of base of tongue, incomplete epiglottic deflection, incomplete hyoid excursion, no appreciation of upper pharyngeal wall peristalsis and minimal opening of PES. There is severe residue in the pyriform sinuses with liquids. A head turn to the right does increase PES opening with liquids, but does not eliminate pooling of residue in (suspected right) pyriform. Aspiration of thin liquids only was moderate, but after a single 1/2 teaspoon of puree, which pt could not propel through oropharynx without multiple liquid washes, residue ultimately spilled into airway resulting in severe aspiration of solid and liquid mixture. Pt did have a cough response, but has no ability to expectorate aspirate up from trachea. Her cough is completely non protective.   SLP attempted to use visual feedback to explain that her coughing meant that the little bit of food and drink she had attempted had gone down the wrong pipe and could hurt her lungs.Pt again reports she swallowed fine at home. Asked pt if there is something else she does at home that helps her swallow. Pt says she does well with small bites of meat. Expressed concern about solids like meat blocking her airway, and pt agreed soft foods are better. SLP agreed, but also pointed out that if  she wanted to eat soft foods, she would have to know that they are also going into her lungs. There were no fully effective modifications or strategies discovered on this test. Would advise letting pt sip water after oral care for her comfort. Water free of food  particulates and with good oral hygiene may be tolerated in small sips. Recommend continuing to provide education to pt about condition and allowing her to make decisions about her preferred oral intake, though risk of negative consequences of aspiration are high.    Factors that may increase risk of adverse event in presence of aspiration Roderick Civatte & Jessy Morocco 2021): Frail or deconditioned;Weak cough;Frequent aspiration of large volumes;Aspiration of thick, dense, and/or acidic materials  Swallow Evaluation Recommendations Recommendations: Free water protocol after oral care Liquid Administration via: Straw Medication Administration: Via alternative means Supervision: Patient able to self-feed Swallowing strategies  : Slow rate;Small bites/sips;Head turn right during swallowing Postural changes: Position pt fully upright for meals;Stay upright 30-60 min after meals Oral care recommendations: Oral care QID (4x/day);Oral care before ice chips/water Caregiver Recommendations: Have oral suction available      Aviv Lengacher, Hardin Leys 09/24/2023,1:41 PM

## 2023-09-24 NOTE — Progress Notes (Signed)
 Concerning for patient's decline tonight. Patient's HR @2300  80. At 0030 dropped to 60's. Patient responds briefly but not as interactive as she was with this RN last night.  Respirations fluctuate. Unable to take deep breaths. Patient was praying repeatedly while this RN was in the room.

## 2023-09-24 NOTE — Consult Note (Signed)
 Reason for Consult: Vocal cord paralysis Referring Physician: [name], [name], MD  Elizabeth Mayer is an 63 y.o. female.  HPI: Asked to discuss with this unfortunate lady the possibility of tracheostomy.  She is a complicated medical history including 4-year history of being treated for lung cancer, and significant problems with voice and swallowing.  She was last seen at [facility] about 1 month ago and fiberoptic exam then revealed bilateral cord paralysis.  Tracheostomy was discussed but she was not quite ready at that time.  She has since been admitted for pneumonia.  Past Medical History:  Diagnosis Date   Anemia    Angina 1982   related to stress   Asthma    in the past,  no current problems   Atrial fibrillation (HCC)    Complication of anesthesia    states anesthesia made her hair fall out, old meds. hard to awaken 1 time she took Flexeril  before.   Constipation    COPD (chronic obstructive pulmonary disease) (HCC)    Diabetes mellitus without complication (HCC)    Dyspnea    on oxygen  at home - 3L via [place]   Dysrhythmia    Fall 06/01/2023   Fatigue    GERD (gastroesophageal reflux disease)    patient denies this dx   Heart murmur 1970s   no problems currently   Hypertension    Ingrown toenail    Lung cancer (HCC) 10/2019   metastatic disease to the brain   On home oxygen  therapy    2L via [place] - 24 hours a day   Past heart attack 1980-1981   pt states she passed out and woke up in hospital- told she had heart attack, but then dr said he couldn't find anything wrong.   Peripheral vascular disease (HCC)    Pneumonia    x 1    Past Surgical History:  Procedure Laterality Date   CERVICAL DISC SURGERY  2000   Disc removed from neck    ingrown toe nail surgery Bilateral    IR IMAGING GUIDED PORT INSERTION  02/16/2020   MULTIPLE TOOTH EXTRACTIONS     for braces   RADIOLOGY WITH ANESTHESIA N/A 12/05/2019   Procedure: MRI BRAIN WITH AND WITHOUT CONTRAST;  Surgeon:  Radiologist, Medication, MD;  Location: [facility];  Service: Radiology;  Laterality: N/A;   RADIOLOGY WITH ANESTHESIA N/A 01/02/2020   Procedure: MRI BRAIN WITH AND WITHOUT CONTRAST;  Surgeon: Radiologist, Medication, MD;  Location: [facility] OR;  Service: Radiology;  Laterality: N/A;   RADIOLOGY WITH ANESTHESIA N/A 02/20/2020   Procedure: MRI WITH ANESTHESIA OF BRAIN WITH AND WITHOUT CONTRAST;  Surgeon: Radiologist, Medication, MD;  Location: [facility];  Service: Radiology;  Laterality: N/A;   RADIOLOGY WITH ANESTHESIA N/A 06/11/2020   Procedure: MRI WITH ANESTHESIA OF BRAIN WITH AND WITHOUT CONTRAST;  Surgeon: Radiologist, Medication, MD;  Location: [facility];  Service: Radiology;  Laterality: N/A;   RADIOLOGY WITH ANESTHESIA N/A 10/10/2020   Procedure: MRI WITH ANESTHESIA BRAIN WITH AND WITHOUT CONTRAST;  Surgeon: Radiologist, Medication, MD;  [facility];  Service: Radiology;  Laterality: N/A;   RADIOLOGY WITH ANESTHESIA N/A 01/30/2021   Procedure: MRI BRAIN WITH AND WITHOUT CONTRASTWITH ANESTHESIA;  Surgeon: Radiologist, Medication, MD;  [facility];  Service: Radiology;  Laterality: N/A;   RADIOLOGY WITH ANESTHESIA N/A 06/12/2021   Procedure: MRI WITH ANESTHESIA OF BRAIN WITH AND WITHOUT CONTRAST;  Surgeon: Radiologist, Medication, MD;  [facility];  Service: Radiology;  Laterality: N/A;  RADIOLOGY WITH ANESTHESIA N/A 09/11/2021   Procedure: MRI OF BRAIN WITH AND WITHOUT CONTRAST WITH ANESTHESIA;  Surgeon: Radiologist, Medication, MD;  [facility];  Service: Radiology;  Laterality: N/A;   RADIOLOGY WITH ANESTHESIA N/A 01/15/2022   Procedure: MRI BRAIN WITH AND WITHOUT CONTRAST WITH ANESTHESIA;  Surgeon: Radiologist, Medication, MD;  [facility];  Service: Radiology;  Laterality: N/A;   RADIOLOGY WITH ANESTHESIA N/A 05/14/2022   Procedure: MRI WITH ANESTHESIA BRAIN WITH AND WITHOUT CONTRAST;  Surgeon: Radiologist, Medication, MD;  [facility]: [facility];  Service: Radiology;  Laterality: N/A;    TONSILLECTOMY      Family History  Problem Relation Age of Onset   Arthritis Mother    Heart failure Father     Social History:  reports that she quit smoking about 24 years ago. Her smoking use included cigarettes. She started smoking about 45 years ago. She has a 21 pack-year smoking history. She has never used smokeless tobacco. She reports that she does not drink alcohol and does not use drugs.  Allergies:  Allergies  Allergen Reactions   Sudafed [Pseudoephedrine] Hypertension   Amlodipine  Swelling   Atorvastatin  Other (See Comments)    Cramping   Gabapentin Other (See Comments)    Raise blood pressure and red rings around eyes Blood vessels popped in her eyes   Mobic  [Meloxicam ] Swelling    Inflamed the area that has inflammation and stabbing pains in the area   Penicillins Hives     Childhood reaction   Prednisone      Caused A-fib    Medications: Reviewed  Results for orders placed or performed during the hospital encounter of 09/19/23 (from the past 48 hours)  Glucose, capillary     Status: Abnormal   Collection Time: 09/22/23 11:17 AM  Result Value Ref Range   Glucose-Capillary 131 (H) 70 - 99 mg/dL    Comment: Glucose reference range applies only to samples taken after fasting for at least 8 hours.  Glucose, capillary     Status: Abnormal   Collection Time: 09/22/23  3:03 PM  Result Value Ref Range   Glucose-Capillary 142 (H) 70 - 99 mg/dL    Comment: Glucose reference range applies only to samples taken after fasting for at least 8 hours.  Glucose, capillary     Status: Abnormal   Collection Time: 09/22/23 11:48 PM  Result Value Ref Range   Glucose-Capillary 154 (H) 70 - 99 mg/dL    Comment: Glucose reference range applies only to samples taken after fasting for at least 8 hours.   Comment 1 Notify RN    Comment 2 Document in Chart   Basic metabolic panel with GFR     Status: Abnormal   Collection Time: 09/23/23  4:20 AM  Result Value Ref Range   Sodium  145 135 - 145 mmol/L   Potassium 3.5 3.5 - 5.1 mmol/L   Chloride 101 98 - 111 mmol/L   CO2 41 (H) 22 - 32 mmol/L   Glucose, Bld 170 (H) 70 - 99 mg/dL    Comment: Glucose reference range applies only to samples taken after fasting for at least 8 hours.   BUN 17 8 - 23 mg/dL   Creatinine, Ser 4.69 0.44 - 1.00 mg/dL   Calcium  8.6 (L) 8.9 - 10.3 mg/dL   GFR, Estimated >62 >95 mL/min    Comment: (NOTE) Calculated using the CKD-EPI Creatinine Equation (2021)    Anion gap 3 (L) 5 - 15    Comment:  Performed at [facility], [address], [place], [place] [address]  Procalcitonin     Status: None   Collection Time: 09/23/23  4:20 AM  Result Value Ref Range   Procalcitonin 0.82 ng/mL    Comment:        Interpretation: PCT > 0.5 ng/mL and <= 2 ng/mL: Systemic infection (sepsis) is possible, but other conditions are known to elevate PCT as well. (NOTE)       Sepsis PCT Algorithm           Lower Respiratory Tract                                      Infection PCT Algorithm    ----------------------------     ----------------------------         PCT < 0.25 ng/mL                PCT < 0.10 ng/mL          Strongly encourage             Strongly discourage   discontinuation of antibiotics    initiation of antibiotics    ----------------------------     -----------------------------       PCT 0.25 - 0.50 ng/mL            PCT 0.10 - 0.25 ng/mL               OR       >80% decrease in PCT            Discourage initiation of                                            antibiotics      Encourage discontinuation           of antibiotics    ----------------------------     -----------------------------         PCT >= 0.50 ng/mL              PCT 0.26 - 0.50 ng/mL                AND       <80% decrease in PCT             Encourage initiation of                                             antibiotics       Encourage continuation           of antibiotics    ----------------------------      -----------------------------        PCT >= 0.50 ng/mL                  PCT > 0.50 ng/mL               AND         increase in PCT                  Strongly encourage  initiation of antibiotics    Strongly encourage escalation           of antibiotics                                     -----------------------------                                           PCT <= 0.25 ng/mL                                                 OR                                        > 80% decrease in PCT                                      Discontinue / Do not initiate                                             antibiotics  Performed at [facility], [address] [place], [place] [address]   Phosphorus     Status: Abnormal   Collection Time: 09/23/23  4:20 AM  Result Value Ref Range   Phosphorus 2.0 (L) 2.5 - 4.6 mg/dL    Comment: Performed at [facility], [address] [place], [place] [address]  Magnesium     Status: None   Collection Time: 09/23/23  4:20 AM  Result Value Ref Range   Magnesium 2.1 1.7 - 2.4 mg/dL    Comment: Performed at [facility], [address] [place], [place] [address]  C-reactive protein     Status: Abnormal   Collection Time: 09/23/23  4:20 AM  Result Value Ref Range   CRP 6.5 (H) <1.0 mg/dL    Comment: Performed at [facility], [address], [place], [place] [address]  CBC with Differential/Platelet     Status: Abnormal   Collection Time: 09/23/23  4:20 AM  Result Value Ref Range   WBC 11.8 (H) 4.0 - 10.5 K/uL   RBC 3.71 (L) 3.87 - 5.11 MIL/uL   Hemoglobin 11.1 (L) 12.0 - 15.0 g/dL   HCT 65.7 84.6 - 96.2 %   MCV 99.7 80.0 - 100.0 fL   MCH 29.9 26.0 - 34.0 pg   MCHC 30.0 30.0 - 36.0 g/dL   RDW 95.2 84.1 - 32.4 %   Platelets 244 150 - 400 K/uL   nRBC 0.0 0.0 - 0.2 %   Neutrophils Relative % 90 %   Neutro Abs 10.7 (H) 1.7 - 7.7 K/uL   Lymphocytes Relative 4 %   Lymphs Abs 0.5 (L) 0.7 - 4.0  K/uL   Monocytes Relative 5 %   Monocytes Absolute 0.6 0.1 - 1.0 K/uL   Eosinophils Relative 0 %  Eosinophils Absolute 0.0 0.0 - 0.5 K/uL   Basophils Relative 0 %   Basophils Absolute 0.0 0.0 - 0.1 K/uL   Immature Granulocytes 1 %   Abs Immature Granulocytes 0.06 0.00 - 0.07 K/uL    Comment: Performed at [facility], [address], [place], [place] [address]  Brain natriuretic peptide     Status: Abnormal   Collection Time: 09/23/23  4:20 AM  Result Value Ref Range   B Natriuretic Peptide 131.1 (H) 0.0 - 100.0 pg/mL    Comment: Performed at [facility], [address], [place], [place] [address]  Ammonia     Status: None   Collection Time: 09/23/23  4:20 AM  Result Value Ref Range   Ammonia 34 9 - 35 umol/L    Comment: Performed at [facility], [address], [place], [place] [address]  Glucose, capillary     Status: Abnormal   Collection Time: 09/23/23  4:40 AM  Result Value Ref Range   Glucose-Capillary 143 (H) 70 - 99 mg/dL    Comment: Glucose reference range applies only to samples taken after fasting for at least 8 hours.   Comment 1 Notify RN    Comment 2 Document in Chart   Glucose, capillary     Status: Abnormal   Collection Time: 09/23/23  8:04 AM  Result Value Ref Range   Glucose-Capillary 140 (H) 70 - 99 mg/dL    Comment: Glucose reference range applies only to samples taken after fasting for at least 8 hours.  Body fluid cell count with differential     Status: Abnormal   Collection Time: 09/23/23 11:09 AM  Result Value Ref Range   Fluid Type-FCT PLEURAL     Comment: CORRECTED ON 06/19 AT 1207: PREVIOUSLY REPORTED AS Pleural R   Color, Fluid STRAW (A) YELLOW   Appearance, Fluid HAZY (A) CLEAR   Total Nucleated Cell Count, Fluid 692 0 - 1,000 cu mm   Neutrophil Count, Fluid 14 0 - 25 %   Lymphs, Fluid 50 %   Monocyte-Macrophage-Serous Fluid 36 (L) 50 - 90 %   Other Cells, Fluid OTHER CELLS IDENTIFIED AS MESOTHELIAL CELLS %    Comment:  Performed at [facility], [address] [place], [place] [address]  Lactate dehydrogenase (pleural or peritoneal fluid)     Status: Abnormal   Collection Time: 09/23/23 11:09 AM  Result Value Ref Range   LD, Fluid 89 (H) 3 - 23 U/L    Comment: (NOTE) Results should be evaluated in conjunction with serum values    Fluid Type-FLDH PLEURAL     Comment: Performed at [facility], [address], [place], [place] [address] CORRECTED ON 06/19 AT 1207: PREVIOUSLY REPORTED AS Pleural R   Protein, pleural or peritoneal fluid     Status: None   Collection Time: 09/23/23 11:09 AM  Result Value Ref Range   Total protein, fluid <3.0 g/dL    Comment: (NOTE) No normal range established for this test Results should be evaluated in conjunction with serum values    Fluid Type-FTP PLEURAL     Comment: Performed at [facility], [address], [place], [place] [address] CORRECTED ON 06/19 AT 1207: PREVIOUSLY REPORTED AS Pleural R   Body fluid culture w Gram Stain     Status: None (Preliminary result)   Collection Time: 09/23/23 11:09 AM   Specimen: Pleural Fluid  Result Value Ref Range  Specimen Description PLEURAL    Special Requests PLEURAL,RIGHT    Gram Stain      RARE WBC PRESENT,BOTH PMN AND MONONUCLEAR NO ORGANISMS SEEN Performed at [facility], [address] [place], [place] [address]    Culture PENDING    Report Status PENDING   Glucose, capillary     Status: Abnormal   Collection Time: 09/23/23 11:59 AM  Result Value Ref Range   Glucose-Capillary 120 (H) 70 - 99 mg/dL    Comment: Glucose reference range applies only to samples taken after fasting for at least 8 hours.  Glucose, capillary     Status: Abnormal   Collection Time: 09/23/23  4:27 PM  Result Value Ref Range   Glucose-Capillary 162 (H) 70 - 99 mg/dL    Comment: Glucose reference range applies only to samples taken after fasting for at least 8 hours.  Glucose, capillary     Status: Abnormal    Collection Time: 09/23/23  8:25 PM  Result Value Ref Range   Glucose-Capillary 171 (H) 70 - 99 mg/dL    Comment: Glucose reference range applies only to samples taken after fasting for at least 8 hours.   Comment 1 Notify RN    Comment 2 Document in Chart   Glucose, capillary     Status: Abnormal   Collection Time: 09/23/23 11:59 PM  Result Value Ref Range   Glucose-Capillary 167 (H) 70 - 99 mg/dL    Comment: Glucose reference range applies only to samples taken after fasting for at least 8 hours.   Comment 1 Notify RN    Comment 2 Document in Chart   Basic metabolic panel     Status: Abnormal   Collection Time: 09/24/23  1:20 AM  Result Value Ref Range   Sodium 144 135 - 145 mmol/L   Potassium 3.5 3.5 - 5.1 mmol/L   Chloride 100 98 - 111 mmol/L   CO2 37 (H) 22 - 32 mmol/L   Glucose, Bld 158 (H) 70 - 99 mg/dL    Comment: Glucose reference range applies only to samples taken after fasting for at least 8 hours.   BUN 17 8 - 23 mg/dL   Creatinine, Ser 6.04 0.44 - 1.00 mg/dL   Calcium  8.2 (L) 8.9 - 10.3 mg/dL   GFR, Estimated >54 >09 mL/min    Comment: (NOTE) Calculated using the CKD-EPI Creatinine Equation (2021)    Anion gap 7 5 - 15    Comment: Performed at [facility], [address], [place], [place] [address]  Glucose, capillary     Status: Abnormal   Collection Time: 09/24/23  4:21 AM  Result Value Ref Range   Glucose-Capillary 109 (H) 70 - 99 mg/dL    Comment: Glucose reference range applies only to samples taken after fasting for at least 8 hours.   Comment 1 Notify RN    Comment 2 Document in Chart   Basic metabolic panel with GFR     Status: Abnormal   Collection Time: 09/24/23  4:45 AM  Result Value Ref Range   Sodium 144 135 - 145 mmol/L   Potassium 3.6 3.5 - 5.1 mmol/L   Chloride 97 (L) 98 - 111 mmol/L   CO2 40 (H) 22 - 32 mmol/L   Glucose, Bld 103 (H) 70 - 99 mg/dL    Comment: Glucose reference range applies only to samples taken after fasting for at  least 8 hours.   BUN 17 8 - 23 mg/dL  Creatinine, Ser 0.58 0.44 - 1.00 mg/dL   Calcium  8.7 (L) 8.9 - 10.3 mg/dL   GFR, Estimated >16 >10 mL/min    Comment: (NOTE) Calculated using the CKD-EPI Creatinine Equation (2021)    Anion gap 7 5 - 15    Comment: Performed at [facility], [address], [place], [place] [address]  Procalcitonin     Status: None   Collection Time: 09/24/23  4:45 AM  Result Value Ref Range   Procalcitonin 0.52 ng/mL    Comment:        Interpretation: PCT > 0.5 ng/mL and <= 2 ng/mL: Systemic infection (sepsis) is possible, but other conditions are known to elevate PCT as well. (NOTE)       Sepsis PCT Algorithm           Lower Respiratory Tract                                      Infection PCT Algorithm    ----------------------------     ----------------------------         PCT < 0.25 ng/mL                PCT < 0.10 ng/mL          Strongly encourage             Strongly discourage   discontinuation of antibiotics    initiation of antibiotics    ----------------------------     -----------------------------       PCT 0.25 - 0.50 ng/mL            PCT 0.10 - 0.25 ng/mL               OR       >80% decrease in PCT            Discourage initiation of                                            antibiotics      Encourage discontinuation           of antibiotics    ----------------------------     -----------------------------         PCT >= 0.50 ng/mL              PCT 0.26 - 0.50 ng/mL                AND       <80% decrease in PCT             Encourage initiation of                                             antibiotics       Encourage continuation           of antibiotics    ----------------------------     -----------------------------        PCT >= 0.50 ng/mL                  PCT > 0.50 ng/mL               AND  increase in PCT                  Strongly encourage                                      initiation of antibiotics    Strongly  encourage escalation           of antibiotics                                     -----------------------------                                           PCT <= 0.25 ng/mL                                                 OR                                        > 80% decrease in PCT                                      Discontinue / Do not initiate                                             antibiotics  Performed at [facility], [address], [place], [place] [address]   Phosphorus     Status: Abnormal   Collection Time: 09/24/23  4:45 AM  Result Value Ref Range   Phosphorus 2.1 (L) 2.5 - 4.6 mg/dL    Comment: Performed at [facility], [address] [place], [place] [address]  Magnesium     Status: None   Collection Time: 09/24/23  4:45 AM  Result Value Ref Range   Magnesium 1.7 1.7 - 2.4 mg/dL    Comment: Performed at [facility], [address], [place], [place] [address]  C-reactive protein     Status: Abnormal   Collection Time: 09/24/23  4:45 AM  Result Value Ref Range   CRP 3.4 (H) <1.0 mg/dL    Comment: Performed at [facility], [address] [place], [place] [address]  CBC with Differential/Platelet     Status: Abnormal   Collection Time: 09/24/23  4:45 AM  Result Value Ref Range   WBC 10.1 4.0 - 10.5 K/uL   RBC 3.54 (L) 3.87 - 5.11 MIL/uL   Hemoglobin 10.6 (L) 12.0 - 15.0 g/dL   HCT 29.5 (L) 62.1 - 30.8 %   MCV 99.7 80.0 - 100.0 fL   MCH 29.9 26.0 - 34.0 pg   MCHC 30.0 30.0 - 36.0 g/dL   RDW 65.7 84.6 - 96.2 %   Platelets 236 150 - 400 K/uL   nRBC 0.0  0.0 - 0.2 %   Neutrophils Relative % 88 %   Neutro Abs 8.8 (H) 1.7 - 7.7 K/uL   Lymphocytes Relative 5 %   Lymphs Abs 0.6 (L) 0.7 - 4.0 K/uL   Monocytes Relative 7 %   Monocytes Absolute 0.7 0.1 - 1.0 K/uL   Eosinophils Relative 0 %   Eosinophils Absolute 0.0 0.0 - 0.5 K/uL   Basophils Relative 0 %   Basophils Absolute 0.0 0.0 - 0.1 K/uL   Immature Granulocytes 0 %   Abs  Immature Granulocytes 0.04 0.00 - 0.07 K/uL    Comment: Performed at [facility], [address] [place], [place] [address]  Brain natriuretic peptide     Status: Abnormal   Collection Time: 09/24/23  4:45 AM  Result Value Ref Range   B Natriuretic Peptide 160.4 (H) 0.0 - 100.0 pg/mL    Comment: Performed at [facility], [address], [place], [place] [address]  Ammonia     Status: None   Collection Time: 09/24/23  4:45 AM  Result Value Ref Range   Ammonia 34 9 - 35 umol/L    Comment: Performed at [facility], [address] [place], [place] [address]   *Note: Due to a large number of results and/or encounters for the requested time period, some results have not been displayed. A complete set of results can be found in Results Review.    DG CHEST PORT 1 VIEW Result Date: 09/23/2023 CLINICAL DATA:  [id number] Status post thoracentesis [id number] EXAM: PORTABLE CHEST - 1 VIEW COMPARISON:  September 22, 2023 FINDINGS: Unchanged moderate right pleural effusion with adjacent masslike consolidation in the hilar region, likely compressive atelectasis or pneumonia. Small left pleural effusion. No pneumothorax. Right chest port in place terminating in the high right atrium. Weighted feeding tube courses below the diaphragm with the distal tip not included in the field of view. Unchanged cardiac silhouette. IMPRESSION: No significant interval change to the lungs. Electronically Signed   By: Rance Burrows M.D.   On: 09/23/2023 12:33   CT SOFT TISSUE NECK W CONTRAST Result Date: 09/23/2023 CLINICAL DATA:  Cranial nerve palsies, multiple (CN 9-12) EXAM: CT NECK WITH CONTRAST TECHNIQUE: Multidetector CT imaging of the neck was performed using the standard protocol following the bolus administration of intravenous contrast. RADIATION DOSE REDUCTION: This exam was performed according to the departmental dose-optimization program which includes automated exposure control, adjustment of the mA and/or  kV according to patient size and/or use of iterative reconstruction technique. CONTRAST:  75mL OMNIPAQUE  IOHEXOL  350 MG/ML SOLN COMPARISON:  CT neck 03/22/2020. FINDINGS: Pharynx and larynx: Normal. No mass or swelling. Salivary glands: No inflammation, mass, or stone. Thyroid : Normal. Lymph nodes: Continue decrease in bulk of previously treated posterior left lower neck lymph nodes with loss of fat planes and poor definition. No well defined/measurable index lymph nodes. Vascular: Not well assessed on this non arterial study. Major arteries are grossly patent in the neck. Limited intracranial: Partially imaged right CP angle mass, better characterized on MRI head from September 20, 2023. Visualized orbits: Not imaged. Mastoids and visualized paranasal sinuses: No mastoid effusions. Partially imaged sinuses are clear. Skeleton: No evidence of acute abnormality on limited assessment. Solid C5-C6 bony fusion. Upper chest: Please see recent CTA of the chest on September 20, 2023 for intrathoracic findings. IMPRESSION: 1. Post treatment related changes in the neck with continued treatment response and no pathologically enlarged lymph nodes identified in the neck. 2.  Partially imaged right CP angle mass, better characterized on MRI head from September 20, 2023. Electronically Signed   By: Stevenson Elbe M.D.   On: 09/23/2023 01:48   CT CHEST W CONTRAST Result Date: 09/22/2023 EXAM: CT CHEST WITH CONTRAST 09/22/2023 04:35:57 PM TECHNIQUE: CT of the chest was performed with the administration of intravenous contrast. Multiplanar reformatted images are provided for review. Automated exposure control, iterative reconstruction, and/or weight based adjustment of the mA/kV was utilized to reduce the radiation dose to as low as reasonably achievable. COMPARISON: Chest radiograph earlier today and CTA chest dated 05/23/2023. CLINICAL HISTORY: Dyspnea, chronic, unclear etiology. 63 y.o. female with past medical history of COPD, afib- not  on anticoag, chronic resp failure on 2-3 L oxygen  at home, NSCLC- s/p chemotherapy and SRS to brain- she has not had treatment since August 2023 and lung and brain disease has been stable on surveillance- she elected to defer any followup with medical oncology since 2023, and has not seen neurooncology since October 2024 - at which time there was mixed findings on brain MRI with two lesions slightly progressed and she expressed she did not want any further CNS directed treatments. She is admitted on 09/19/2023 with shortness of breath. Workup reveals sepsis due to aspiration pneumonia. She is currently in ICU on bipap. MRI of brain showed new progressing brain mass. Palliative consulted for GOC and medical decision making. She has vocal chord paralysis and likely needs tracheostomy and PEG tube if she desires continued aggressive medical. FINDINGS: MEDIASTINUM: Heart and pericardium are unremarkable. The central airways are clear. Right chest port terminating in the upper right atrium. LYMPH NODES: No mediastinal, hilar or axillary lymphadenopathy. LUNGS AND PLEURA: Complete right middle and lower lobe collapse. Moderate right pleural effusion, progressive. Small left pleural effusion, progressive. Associated left lower lobe opacity, atelectasis versus pneumonia. Mild centrilobular and paraseptal emphysematous changes, upper lung predominant. SOFT TISSUES/BONES: Radiation changes in the right hemithorax. UPPER ABDOMEN: Enteric tube coursing into the mid stomach. Moderate upper abdominal ascites, progressive. IMPRESSION: 1. Moderate right and small left pleural effusions and moderate upper abdominal ascites, progressive. 2. Otherwise unchanged from recent CT. 3. Complete right middle and lower lobe collapse. Patchy left lower lobe opacity, atelectasis versus pneumonia. 4. Additional ancillary findings as above. Electronically signed by: Zadie Herter MD 09/22/2023 07:52 PM EDT RP Workstation: [id number]     QMV:HQIONGEX except as listed in admit H&P  Blood pressure 110/60, pulse 77, temperature (!) 97.4 F (36.3 C), temperature source Oral, resp. rate (!) 25, height 5' 5 (1.651 m), weight 49.8 kg, SpO2 97%.  PHYSICAL EXAM: Overall appearance: Chronically ill-appearing, cachectic lady in no distress.  Her breathing is clear at rest but with forced inspiration she has inspiratory stridor.  She is nearly aphonic. Head:  Normocephalic, atraumatic. Ears: External ears look healthy. Nose: External nose is healthy in appearance.  Oral Cavity/Pharynx:  There are no mucosal lesions or masses identified. Larynx/Hypopharynx: Deferred Neuro: Right facial nerve paralysis Neck: No palpable neck masses.  Studies Reviewed: none  Procedures: none   Assessment/Plan: Chronic bilateral vocal cord paralysis with inspiratory stridor, recent presumed aspiration pneumonia.  I had a long discussion with her about tracheostomy, the indications, the role that it would play in providing her a safer airway, improved respiratory function, and ability to suction and avoid aspiration complications.  She will think about this and if she decides she would like to proceed then I can potentially schedule this as early as Monday but could also  be later in the week next week or the following week if she is not ready to make that decision sooner.  C34.90  J38.02    Medical Decision Making: #/Complex Problems: 3  Data Reviewed:3  Management:3 (1-Straightforward, 2-Low, 3-Moderate, 4-High)   Janita Mellow 09/24/2023, 9:19 AM

## 2023-09-25 ENCOUNTER — Encounter (HOSPITAL_COMMUNITY): Payer: Self-pay | Admitting: Pulmonary Disease

## 2023-09-25 DIAGNOSIS — J189 Pneumonia, unspecified organism: Secondary | ICD-10-CM | POA: Diagnosis not present

## 2023-09-25 DIAGNOSIS — R1319 Other dysphagia: Secondary | ICD-10-CM | POA: Diagnosis not present

## 2023-09-25 DIAGNOSIS — E43 Unspecified severe protein-calorie malnutrition: Secondary | ICD-10-CM | POA: Diagnosis not present

## 2023-09-25 DIAGNOSIS — C3490 Malignant neoplasm of unspecified part of unspecified bronchus or lung: Secondary | ICD-10-CM | POA: Diagnosis not present

## 2023-09-25 DIAGNOSIS — C7931 Secondary malignant neoplasm of brain: Secondary | ICD-10-CM | POA: Diagnosis not present

## 2023-09-25 LAB — BASIC METABOLIC PANEL WITH GFR
Anion gap: 3 — ABNORMAL LOW (ref 5–15)
BUN: 18 mg/dL (ref 8–23)
CO2: 41 mmol/L — ABNORMAL HIGH (ref 22–32)
Calcium: 8.2 mg/dL — ABNORMAL LOW (ref 8.9–10.3)
Chloride: 99 mmol/L (ref 98–111)
Creatinine, Ser: 0.58 mg/dL (ref 0.44–1.00)
GFR, Estimated: >60 mL/min (ref >60)
Glucose, Bld: 153 mg/dL — ABNORMAL HIGH (ref 70–99)
Potassium: 3.6 mmol/L (ref 3.5–5.1)
Sodium: 143 mmol/L (ref 135–145)

## 2023-09-25 LAB — GLUCOSE, CAPILLARY
Glucose-Capillary: 138 mg/dL — ABNORMAL HIGH (ref 70–99)
Glucose-Capillary: 152 mg/dL — ABNORMAL HIGH (ref 70–99)
Glucose-Capillary: 168 mg/dL — ABNORMAL HIGH (ref 70–99)
Glucose-Capillary: 169 mg/dL — ABNORMAL HIGH (ref 70–99)
Glucose-Capillary: 172 mg/dL — ABNORMAL HIGH (ref 70–99)
Glucose-Capillary: 181 mg/dL — ABNORMAL HIGH (ref 70–99)

## 2023-09-25 LAB — CBC WITH DIFFERENTIAL/PLATELET
Abs Immature Granulocytes: 0.04 10*3/uL (ref 0.00–0.07)
Basophils Absolute: 0 10*3/uL (ref 0.0–0.1)
Basophils Relative: 0 %
Eosinophils Absolute: 0 10*3/uL (ref 0.0–0.5)
Eosinophils Relative: 0 %
HCT: 34.7 % — ABNORMAL LOW (ref 36.0–46.0)
Hemoglobin: 10.4 g/dL — ABNORMAL LOW (ref 12.0–15.0)
Immature Granulocytes: 0 %
Lymphocytes Relative: 2 %
Lymphs Abs: 0.2 10*3/uL — ABNORMAL LOW (ref 0.7–4.0)
MCH: 30.1 pg (ref 26.0–34.0)
MCHC: 30 g/dL (ref 30.0–36.0)
MCV: 100.6 fL — ABNORMAL HIGH (ref 80.0–100.0)
Monocytes Absolute: 0.2 10*3/uL (ref 0.1–1.0)
Monocytes Relative: 2 %
Neutro Abs: 9.4 10*3/uL — ABNORMAL HIGH (ref 1.7–7.7)
Neutrophils Relative %: 96 %
Platelets: 244 10*3/uL (ref 150–400)
RBC: 3.45 MIL/uL — ABNORMAL LOW (ref 3.87–5.11)
RDW: 12.8 % (ref 11.5–15.5)
WBC: 9.9 10*3/uL (ref 4.0–10.5)
nRBC: 0 % (ref 0.0–0.2)

## 2023-09-25 LAB — LACTIC ACID, PLASMA: Lactic Acid, Venous: 1.5 mmol/L (ref 0.5–1.9)

## 2023-09-25 LAB — PROCALCITONIN: Procalcitonin: 0.29 ng/mL

## 2023-09-25 LAB — BRAIN NATRIURETIC PEPTIDE: B Natriuretic Peptide: 199.8 pg/mL — ABNORMAL HIGH (ref 0.0–100.0)

## 2023-09-25 MED ORDER — METOPROLOL TARTRATE 5 MG/5ML IV SOLN: 5.0000 mg | INTRAVENOUS | Status: DC | PRN

## 2023-09-25 MED ORDER — SODIUM CHLORIDE 0.9 % IV BOLUS
1000.0000 mL | Freq: Once | INTRAVENOUS | Status: AC
  Administered 2023-09-25: 1000 mL via INTRAVENOUS

## 2023-09-25 MED ORDER — ALBUMIN HUMAN 25 % IV SOLN
50.0000 g | Freq: Once | INTRAVENOUS | Status: AC
  Administered 2023-09-25: 50 g via INTRAVENOUS
  Filled 2023-09-25: qty 200

## 2023-09-25 MED ORDER — HYDROCORTISONE SOD SUC (PF) 100 MG IJ SOLR
100.0000 mg | Freq: Once | INTRAMUSCULAR | Status: AC
  Administered 2023-09-25: 100 mg via INTRAVENOUS
  Filled 2023-09-25: qty 2

## 2023-09-25 NOTE — Progress Notes (Addendum)
 TRH ROUNDING NOTE Elizabeth Mayer FMW:[id number]  DOB: 1960-12-15  DOA: 09/19/2023  PCP: Georgina Speaks, FNP  09/25/2023,2:08 PM  LOS: 6 days    Code Status: full code   From:   home   Current Dispo: unclear   63 year old female COPD, asthma Stage IV adenocarcinoma lung--previous maintenance pemetrexed /pembrolizumab  [treatment holiday since 2023 discontinued carboplatin --had second opinion at [facility]]with brain mets prior SRS to brain mets 2021--followed also by Dr. Buckley  prior postobstructive pneumonia Recent diagnosis vocal cord paralysis ENT [facility] Dr. Katheryn Hymes--was scheduled for elective tracheostomy Chronic respiratory failure 2 to 3 L, former smoker  Admitted 6/15 with shortness of breath and found to be in A-fib with RVR new onset Workup revealed pneumonia on chest x-ray Sodium 144 potassium 5.4 BUN/creatinine 26/0.9 (baseline 16/0.8) AST 48 ALT 27 bili 1.9 BNP 123 troponin 57  lactic acid 2.0 WBC 12.8 hemoglobin 13-COVID flu RSV all negative-blood cultures collected negative--- extended RVP negative  Felt to have Dysphagia contributing from her nerve paralysis from the brain mass  6/16 MRI brain enhancing mass along right cerebellopontine cistern suspicious for metastasis moderate mass effect brainstem right cerebellar hemisphere-previous supratentorial masses are decreased in size 6/17 more lethargic transferred to ICU placed on BiPAP--palliative medicine consulted 6/19 transferred to [facility]--thoracentesis 300 cc performed right pleural space  Plan  Bradycardia overnight secondary to beta-blocker Bradycardia now resolved heart rate 56 to Metoprolol  12.5 twice daily held because of bradycardia overnight 6/20--given abdomen 51 L saline bolus and hydrocortisone --lactic acid not really elevated BNP 199  Keep on telemetry  Severe aspiration risk-aspiration pneumonia from vocal cord paralysis 2/2 cerebellopontine metastases Continue Unasyn  IV 3 g every 6 as needed Failed MBS hypoglossal  facial nerve impairment non-protective cough--SLP planning to continue efforts when she is more stable procalcitonin down from 0.5-0.2  Postobstructive versus malignant pleural effusion--had thoracentesis as above Gram stain -14% neutrophils 50% lymphocytes 36% monocytes cytology reactive cells Continue to treat infection  New paroxysmal A-fib CHA2DS2-VASc >4 Only as needed IV metoprolol  5 mg every 4 heart rate above 120 Poor candidate for anticoagulation with brain mets  Stage IV NSCLC with brain mets causing facial nerve paralysis and severe dysphagia and stridor on admission Radiation oncology, neuro-oncology Dr. Gerhardt. Buckley aware of patient  Could get outpatient SBRT to brain to treat the brain lesion causing the above First would require tracheostomy and PEG tube and patient is undecided about this over several discussions and still seems to think she would be able to eat with the severe dysphagia which is quite unlikely according to speech ENT Dr. Jesus aware of patient and can perform tracheotomy on 09/27/2023 if patient willing  Protein energy malnutrition secondary to cancer Hyponatremia with metabolic alkalosis Continue tube feeds Osmolite Prosource Continue free water  200 every 4, continue Diamox  500 twice daily  GOC-Patient is in denial about prognosis and treatment plans regarding  PEG , tracheotomy  This in my opinion would be her only way to get to the outpatient to get radiation that might give her a chance at being able to recover from the facial palsy I do not think she can get the radiation unless nutrition improves--radiation might cause mucositis etc.--I can inquire to see if radiation oncology would conisder doig raditation without peg/trach If cannot do the therapies as above and continues to insist on feeds, we have given a time-limited duration of until 6/22 to make a decision and then we will decide about removal of core track and let her go home to  complete  antibiotics with extremely high risk of readmission and/or death and decline    DVT prophylaxis: Lovenox   Status is: Inpatient Remains inpatient appropriate because:   Requires further management   Subjective:  Coarse breath sounds-seems sleepy  Objective + exam Vitals:   09/25/23 0500 09/25/23 0516 09/25/23 0745 09/25/23 1146  BP:  (!) 92/46 (!) 98/50 (!) 96/54  Pulse:  65 64 65  Resp:  20 20 20   Temp:   (!) 97.4 F (36.3 C) 97.6 F (36.4 C)  TempSrc:   Oral Oral  SpO2:  98% 99% 100%  Weight: 52.8 kg     Height:       Filed Weights   09/23/23 0941 09/24/23 0500 09/25/23 0500  Weight: 49.5 kg 49.8 kg 52.8 kg    Examination:  Cachectic black female Droop on left side of face S1-S2 PVCs heart rate 50s when reviewed Core track in place on oxygen  Specimens posterolaterally No lower extremity edema ROM intact but weak and needs assistance sitting   Data Reviewed: reviewed   CBC    Component Value Date/Time   WBC 9.9 09/25/2023 0355   RBC 3.45 (L) 09/25/2023 0355   HGB 10.4 (L) 09/25/2023 0355   HGB 11.7 07/26/2023 1254   HCT 34.7 (L) 09/25/2023 0355   HCT 36.4 07/26/2023 1254   PLT 244 09/25/2023 0355   PLT 166 07/26/2023 1254   MCV 100.6 (H) 09/25/2023 0355   MCV 92 07/26/2023 1254   MCH 30.1 09/25/2023 0355   MCHC 30.0 09/25/2023 0355   RDW 12.8 09/25/2023 0355   RDW 13.5 07/26/2023 1254   LYMPHSABS 0.2 (L) 09/25/2023 0355   LYMPHSABS 1.2 07/26/2023 1254   MONOABS 0.2 09/25/2023 0355   EOSABS 0.0 09/25/2023 0355   EOSABS 0.1 07/26/2023 1254   BASOSABS 0.0 09/25/2023 0355   BASOSABS 0.0 07/26/2023 1254      Latest Ref Rng & Units 09/25/2023    3:55 AM 09/24/2023    4:45 AM 09/24/2023    1:20 AM  CMP  Glucose 70 - 99 mg/dL 846  896  841   BUN 8 - 23 mg/dL 18  17  17    Creatinine 0.44 - 1.00 mg/dL 9.41  9.41  9.38   Sodium 135 - 145 mmol/L 143  144  144   Potassium 3.5 - 5.1 mmol/L 3.6  3.6  3.5   Chloride 98 - 111 mmol/L 99  97  100   CO2  22 - 32 mmol/L 41  40  37   Calcium  8.9 - 10.3 mg/dL 8.2  8.7  8.2     Scheduled Meds:  acetaZOLAMIDE   500 mg Per Tube BID   budesonide -glycopyrrolate -formoterol   2 puff Inhalation BID   Chlorhexidine  Gluconate Cloth  6 each Topical Daily   enoxaparin  (LOVENOX ) injection  40 mg Subcutaneous Q24H   feeding supplement (PROSource TF20)  60 mL Per Tube Daily   free water   200 mL Per Tube Q4H   guaiFENesin   20 mL Per Tube TID   loperamide  HCl  2 mg Per Tube BID   methylPREDNISolone   4 mg Per Tube 3 x daily with food   [START ON 09/26/2023] methylPREDNISolone   4 mg Per Tube 4X daily taper   methylPREDNISolone   8 mg Per Tube Nightly   multivitamin with minerals  1 tablet Per Tube Daily   thiamine  (VITAMIN B1) injection  100 mg Intravenous Daily   Continuous Infusions:  ampicillin -sulbactam (UNASYN ) IV 3 g (09/25/23 1217)  feeding supplement (OSMOLITE 1.5 CAL) 35 mL/hr at 09/23/23 1729    Time 24  [name] Lei Dower, MD  [facility]

## 2023-09-25 NOTE — Progress Notes (Signed)
 HR continues to drop to high 40's but does not sustain. Will continue to monitor

## 2023-09-25 NOTE — Significant Event (Signed)
 Rapid Response Event Note   Reason for Call :  Intermittent bradycardia, hypotension  Initial Focused Assessment:  Pt lying in bed with eyes open, in no visible distress. She is able to answer questions/follow commands, however, it's hard to understand her d/t garbled speech. She does nod yes when asked if she is lighteheaded and shakes her head no when asked if she has CP/SOB. Pt with known paralysis of R side of face. Lungs with rhonchi t/o. Skin warm/dry.  T-97.5, HR-40-60s, BP-87/48, RR-20, SpO2-99% on [place] 4L  Interventions:  EKG-SB with PACs 1L NS 50g 25% Albumin  100mg  Hydrocortisone  BNP, LA Plan of Care:  BP better(99/56) with 1L bolus and albumin  infusing. Await lab results. Continue to monitor pt closely. Please call RRT if further assistance needed.  Event Summary:   MD Notified: Dr. Sherlon notified by bedside RN PTA RRT Call [phone] Arrival Time:0326 End Upfz:9648  Tish Graeme Piety, RN

## 2023-09-25 NOTE — Plan of Care (Signed)
  Problem: Clinical Measurements: Goal: Diagnostic test results will improve Outcome: Not Progressing Goal: Respiratory complications will improve Outcome: Not Progressing   Problem: Activity: Goal: Risk for activity intolerance will decrease Outcome: Not Progressing   Problem: Nutrition: Goal: Adequate nutrition will be maintained Outcome: Not Progressing

## 2023-09-25 NOTE — Progress Notes (Signed)
 Patient ID: Elizabeth Mayer, female   DOB: 01-30-1961, 63 y.o.   MRN: [id number]    Progress Note from the Palliative Medicine Team at [facility]   Patient Name: Elizabeth Mayer        Date: 09/25/2023 DOB: 09/21/1960  Age: 63 y.o. MRN#: [id number] Attending Physician: [name], [name], MD Primary Care Physician: Georgina Speaks, FNP Admit Date: 09/19/2023   Reason for Consultation/Follow-up   Establishing Goals of Care   HPI/ Brief Hospital Review  63 year old with COPD, paroxysmal atrial fibrillation not on anticoagulation, chronic hypoxic respiratory failure on 2 L oxygen , non-small cell lung cancer with mets to the brain admitted on 6/15 with severe sepsis due to currently acquired pneumonia, hypoxic, hypercarbic respiratory failure   Palliative consulted for GOC and medical decision making.    Subjective  Extensive chart review has been completed prior to meeting with patient/family  including labs, vital signs, imaging, progress/consult notes, orders, medications and available advance directive documents.    This NP assessed patient at the bedside as a follow up for palliative medicine needs and emotional support.  Today patient is too weak and lethargic to participate in conversation regarding goals of care.  Complex medical situation; patient with serious life limiting disease.   Currently patient unable to participate in conversation regarding goals of care and no readily available family members to help with decisions.  I left a voicemail for [employer] and [employer]/brother listed in chart as contacts, await callback.  Patient is high risk for decompensation.  Unfortunately no advance care planning or H POA has been established along course of treatment.     Discussed with primary team/via secure chat  and nursing staff  PMT will continue to support holistically   Time:  35 minutes  Detailed review of medical records ( labs, imaging, vital signs),  medically appropriate exam ( MS, skin, cardiac,  resp)   discussed with treatment team, counseling and education to patient, family, staff, documenting clinical information, medication management, coordination of care    Ronal Plants NP  Palliative Medicine Team Team Phone # [phone] Pager [phone]

## 2023-09-25 NOTE — Progress Notes (Signed)
 SLP Cancellation Note  Patient Details Name: Elizabeth Mayer MRN: [id number] DOB: 1960/08/16   Cancelled treatment:       Reason Eval/Treat Not Completed: Other (comment);Fatigue/lethargy limiting ability to participate (pt too lethargic at this time, in bed with open mouth posture, note events of over night; will continue efforts)  Madelin POUR, MS [facility] SLP Acute Rehab Services Office [phone]  Nicolas Emmie Caldron 09/25/2023, 12:51 PM

## 2023-09-25 NOTE — Plan of Care (Signed)
  Problem: Education: Goal: Knowledge of General Education information will improve Description: Including pain rating scale, medication(s)/side effects and non-pharmacologic comfort measures Outcome: Progressing   Problem: Pain Managment: Goal: General experience of comfort will improve and/or be controlled Outcome: Progressing

## 2023-09-25 NOTE — Progress Notes (Signed)
 Patient blood pressure 87/48, heart rate dips intermittently in the mid 40s, but not sustaining, no change in mentation, will discontinue scheduled metoprolol , will give 50 mg of IV albumin , IV NS 1 L bolus, will give one-time stress dose hydrocortisone , check EKG, will add BNP, lactic acid, to morning labs. Brayton Lye MD

## 2023-09-26 DIAGNOSIS — J189 Pneumonia, unspecified organism: Secondary | ICD-10-CM | POA: Diagnosis not present

## 2023-09-26 LAB — CBC WITH DIFFERENTIAL/PLATELET
Abs Immature Granulocytes: 0.06 10*3/uL (ref 0.00–0.07)
Basophils Absolute: 0 10*3/uL (ref 0.0–0.1)
Basophils Relative: 0 %
Eosinophils Absolute: 0 10*3/uL (ref 0.0–0.5)
Eosinophils Relative: 0 %
HCT: 35.7 % — ABNORMAL LOW (ref 36.0–46.0)
Hemoglobin: 10.8 g/dL — ABNORMAL LOW (ref 12.0–15.0)
Immature Granulocytes: 1 %
Lymphocytes Relative: 3 %
Lymphs Abs: 0.4 10*3/uL — ABNORMAL LOW (ref 0.7–4.0)
MCH: 30.3 pg (ref 26.0–34.0)
MCHC: 30.3 g/dL (ref 30.0–36.0)
MCV: 100.3 fL — ABNORMAL HIGH (ref 80.0–100.0)
Monocytes Absolute: 0.6 10*3/uL (ref 0.1–1.0)
Monocytes Relative: 4 %
Neutro Abs: 12.2 10*3/uL — ABNORMAL HIGH (ref 1.7–7.7)
Neutrophils Relative %: 92 %
Platelets: 251 10*3/uL (ref 150–400)
RBC: 3.56 MIL/uL — ABNORMAL LOW (ref 3.87–5.11)
RDW: 12.6 % (ref 11.5–15.5)
WBC: 13.2 10*3/uL — ABNORMAL HIGH (ref 4.0–10.5)
nRBC: 0 % (ref 0.0–0.2)

## 2023-09-26 LAB — GLUCOSE, CAPILLARY
Glucose-Capillary: 114 mg/dL — ABNORMAL HIGH (ref 70–99)
Glucose-Capillary: 125 mg/dL — ABNORMAL HIGH (ref 70–99)
Glucose-Capillary: 142 mg/dL — ABNORMAL HIGH (ref 70–99)
Glucose-Capillary: 145 mg/dL — ABNORMAL HIGH (ref 70–99)
Glucose-Capillary: 148 mg/dL — ABNORMAL HIGH (ref 70–99)
Glucose-Capillary: 171 mg/dL — ABNORMAL HIGH (ref 70–99)

## 2023-09-26 LAB — COMPREHENSIVE METABOLIC PANEL WITH GFR
ALT: 18 U/L (ref 0–44)
AST: 27 U/L (ref 15–41)
Albumin: 2.5 g/dL — ABNORMAL LOW (ref 3.5–5.0)
Alkaline Phosphatase: 49 U/L (ref 38–126)
Anion gap: 6 (ref 5–15)
BUN: 20 mg/dL (ref 8–23)
CO2: 37 mmol/L — ABNORMAL HIGH (ref 22–32)
Calcium: 8.8 mg/dL — ABNORMAL LOW (ref 8.9–10.3)
Chloride: 100 mmol/L (ref 98–111)
Creatinine, Ser: 0.71 mg/dL (ref 0.44–1.00)
GFR, Estimated: >60 mL/min (ref >60)
Glucose, Bld: 124 mg/dL — ABNORMAL HIGH (ref 70–99)
Potassium: 3.2 mmol/L — ABNORMAL LOW (ref 3.5–5.1)
Sodium: 143 mmol/L (ref 135–145)
Total Bilirubin: 0.4 mg/dL (ref 0.0–1.2)
Total Protein: 5.3 g/dL — ABNORMAL LOW (ref 6.5–8.1)

## 2023-09-26 LAB — BODY FLUID CULTURE W GRAM STAIN: Culture: NO GROWTH

## 2023-09-26 LAB — PROCALCITONIN: Procalcitonin: 0.17 ng/mL

## 2023-09-26 MED ORDER — LOPERAMIDE HCL 2 MG PO CAPS: 2.0000 mg | ORAL_CAPSULE | Freq: Three times a day (TID) | ORAL | Status: DC

## 2023-09-26 MED ORDER — LOPERAMIDE HCL 1 MG/7.5ML PO SUSP
2.0000 mg | Freq: Three times a day (TID) | ORAL | Status: DC
  Administered 2023-09-26 – 2023-09-28 (×7): 2 mg
  Filled 2023-09-26 (×8): qty 15

## 2023-09-26 NOTE — Consult Note (Signed)
 Consult note  Patient with stage IV NSCLC, has previously received SRS to multiple brain mets under Dr. Izell. Recent MRI shows a metastasis in the right cerebellopontine angle. I will reach out to her to get her opinion regarding further XRT at this time. In reviewing her chart, I believe that XRT may be feasible without further procedures such as placement of feeding tube, but this can be further discussed with the patient.  ------------------------------------------------  Elizabeth CANDIE Limes, MD, PhD

## 2023-09-26 NOTE — Progress Notes (Signed)
 TRH ROUNDING NOTE Elizabeth Mayer FMW:[id number]  DOB: 05-31-1960  DOA: 09/19/2023  PCP: Georgina Speaks, FNP  09/26/2023,8:42 AM  LOS: 7 days    Code Status: full code   From:   home   Current Dispo: unclear   63 year old female COPD, asthma Stage IV adenocarcinoma lung--previous maintenance pemetrexed /pembrolizumab  [treatment holiday since 2023 discontinued carboplatin --had second opinion at [facility]]with brain mets prior SRS to brain mets 2021--followed also by Dr. Buckley  prior postobstructive pneumonia Recent diagnosis vocal cord paralysis ENT [facility] Dr. Katheryn Hymes--was scheduled for elective tracheostomy Chronic respiratory failure 2 to 3 L, former smoker  Admitted 6/15 with shortness of breath and found to be in A-fib with RVR new onset Workup revealed pneumonia on chest x-ray Sodium 144 potassium 5.4 BUN/creatinine 26/0.9 (baseline 16/0.8) AST 48 ALT 27 bili 1.9 BNP 123 troponin 57  lactic acid 2.0 WBC 12.8 hemoglobin 13-COVID flu RSV all negative-blood cultures collected negative--- extended RVP negative  Felt to have Dysphagia contributing from her nerve paralysis from the brain mass  6/16 MRI brain enhancing mass along right cerebellopontine cistern suspicious for metastasis moderate mass effect brainstem right cerebellar hemisphere-previous supratentorial masses are decreased in size 6/17 more lethargic transferred to ICU placed on BiPAP--palliative medicine consulted 6/19 transferred to [facility]--thoracentesis 300 cc performed right pleural space  Plan  Bradycardia now resolved with holding Toprol  12.5 twice daily Workup overall negative s/p  bolus IV fluid, albumin  Keep on telemetry for now-as needed IV metoprolol  5 Q4 for heart rate above 150 expect a little bit of tachycardia from underlying state.  New paroxysmal A-fib CHA2DS2-VASc >4 Only as needed IV metoprolol  5 mg every 4 heart rate above 120 Poor candidate for anticoagulation with brain mets  Severe aspiration risk-aspiration  pneumonia from vocal cord paralysis 2/2 cerebellopontine metastases h/o Continue Unasyn  IV 3 g every 6 as needed Failed MBS hypoglossal facial nerve impairment non-protective cough--SLP planning to continue efforts when she is more stable Labs are pending from today.  Postobstructive versus malignant pleural effusion--had thoracentesis as above Gram stain -14% neutrophils 50% lymphocytes 36% monocytes cytology reactive cells--Culture of Pleural fluid from 6/19 ngtd Continue to treat infection as per above with Unasyn  Periodic cxr as prn  COPD On taper of Medrol  Dosepak by mouth Continue levalbuterol  1.25 every 4 as needed, Breztri  2 puffs twice daily Robitussin 20 3 times daily Tessalon  200 3 times daily as needed  Metabolic alkalosis mild hypernatremia Continue free water  flushes 200 every 4 Continue Diamox  500 twice daily  Stage IV NSCLC with brain mets causing facial nerve paralysis and severe dysphagia and stridor on admission Radiation oncology, neuro-oncology Dr. Gerhardt. Buckley aware of patient  Discussed case with Dr. Norleen Limes rad onc 6/22 and radiation oncology will address if patient can get head neck SBRT prior to tracheotomy/PEG tube and will address risks of mucositis etc. as patient has only core track in place for feeds ENT Dr. Jesus aware of patient and can perform tracheotomy on 09/27/2023 if patient willing  Protein energy malnutrition secondary to cancer Continue tube feeds Osmolite Prosource  GOC-Patient is in denial about prognosis and treatment plans regarding  PEG , tracheotomy  Await input from specialists    DVT prophylaxis: Lovenox   Status is: Inpatient Remains inpatient appropriate because:   Requires further management   Subjective:  Continues to be in denial-says she does not have stage IV cancer She has not made a decision about trach and PEG I spent a good 10 minutes discussing with her  possibility of radiation therapy to the brain--with  potential complications[mucositis?]---again-She wants to think about it She expressed that she does not like the cancer center here Overall relatively unchanged-nursing reports multiple stools with some breakdown on bottom   Objective + exam Vitals:   09/25/23 1937 09/25/23 2330 09/26/23 0319 09/26/23 0500  BP: (!) 81/51 103/60 (!) 95/52   Pulse: 65 67 65 65  Resp: 17 20 20 13   Temp: 98.1 F (36.7 C) 97.6 F (36.4 C) (!) 97.5 F (36.4 C)   TempSrc: Oral Oral Oral   SpO2: 100% 95% 100% 100%  Weight:    54.1 kg  Height:       Filed Weights   09/24/23 0500 09/25/23 0500 09/26/23 0500  Weight: 49.8 kg 52.8 kg 54.1 kg    Examination:  EOMI NCAT no focal deficit no icterus no pallor Core track in place Very cachectic Hemifacial plegia with drawing up of the L side of mouth some ptosis on the left side S1-S2 no M, no real brady seems to be sinus now Abdomen is soft no rebound no guarding No lower extremity edema Chest decreased AE posteriorly some crackles  Data Reviewed: reviewed   CBC    Component Value Date/Time   WBC 9.9 09/25/2023 0355   RBC 3.45 (L) 09/25/2023 0355   HGB 10.4 (L) 09/25/2023 0355   HGB 11.7 07/26/2023 1254   HCT 34.7 (L) 09/25/2023 0355   HCT 36.4 07/26/2023 1254   PLT 244 09/25/2023 0355   PLT 166 07/26/2023 1254   MCV 100.6 (H) 09/25/2023 0355   MCV 92 07/26/2023 1254   MCH 30.1 09/25/2023 0355   MCHC 30.0 09/25/2023 0355   RDW 12.8 09/25/2023 0355   RDW 13.5 07/26/2023 1254   LYMPHSABS 0.2 (L) 09/25/2023 0355   LYMPHSABS 1.2 07/26/2023 1254   MONOABS 0.2 09/25/2023 0355   EOSABS 0.0 09/25/2023 0355   EOSABS 0.1 07/26/2023 1254   BASOSABS 0.0 09/25/2023 0355   BASOSABS 0.0 07/26/2023 1254      Latest Ref Rng & Units 09/25/2023    3:55 AM 09/24/2023    4:45 AM 09/24/2023    1:20 AM  CMP  Glucose 70 - 99 mg/dL 846  896  841   BUN 8 - 23 mg/dL 18  17  17    Creatinine 0.44 - 1.00 mg/dL 9.41  9.41  9.38   Sodium 135 - 145 mmol/L 143   144  144   Potassium 3.5 - 5.1 mmol/L 3.6  3.6  3.5   Chloride 98 - 111 mmol/L 99  97  100   CO2 22 - 32 mmol/L 41  40  37   Calcium  8.9 - 10.3 mg/dL 8.2  8.7  8.2     Scheduled Meds:  acetaZOLAMIDE   500 mg Per Tube BID   budesonide -glycopyrrolate -formoterol   2 puff Inhalation BID   Chlorhexidine  Gluconate Cloth  6 each Topical Daily   enoxaparin  (LOVENOX ) injection  40 mg Subcutaneous Q24H   feeding supplement (PROSource TF20)  60 mL Per Tube Daily   free water   200 mL Per Tube Q4H   guaiFENesin   20 mL Per Tube TID   loperamide  HCl  2 mg Per Tube BID   methylPREDNISolone   4 mg Per Tube 4X daily taper   multivitamin with minerals  1 tablet Per Tube Daily   Continuous Infusions:  ampicillin -sulbactam (UNASYN ) IV 3 g (09/26/23 0558)   feeding supplement (OSMOLITE 1.5 CAL) 35 mL/hr at 09/23/23 1729  Time 36  [name] Emiko Osorto, MD  [facility]

## 2023-09-26 NOTE — Progress Notes (Signed)
 SLP Cancellation Note  Patient Details Name: Elizabeth Mayer MRN: [id number] DOB: 03-09-1961   Cancelled treatment:       Reason Eval/Treat Not Completed: Other (comment) (staff present to obtain blood draw - will continue efforts next date)  Elizabeth POUR, MS [facility] SLP Acute Rehab Services Office [phone]   Elizabeth Mayer 09/26/2023, 12:49 PM

## 2023-09-27 ENCOUNTER — Inpatient Hospital Stay: Attending: Radiation Oncology

## 2023-09-27 DIAGNOSIS — J189 Pneumonia, unspecified organism: Secondary | ICD-10-CM | POA: Diagnosis not present

## 2023-09-27 DIAGNOSIS — Z515 Encounter for palliative care: Secondary | ICD-10-CM | POA: Diagnosis not present

## 2023-09-27 DIAGNOSIS — F05 Delirium due to known physiological condition: Secondary | ICD-10-CM

## 2023-09-27 DIAGNOSIS — Z7189 Other specified counseling: Secondary | ICD-10-CM | POA: Diagnosis not present

## 2023-09-27 DIAGNOSIS — R627 Adult failure to thrive: Secondary | ICD-10-CM

## 2023-09-27 DIAGNOSIS — C3490 Malignant neoplasm of unspecified part of unspecified bronchus or lung: Secondary | ICD-10-CM | POA: Diagnosis not present

## 2023-09-27 DIAGNOSIS — E43 Unspecified severe protein-calorie malnutrition: Secondary | ICD-10-CM | POA: Diagnosis not present

## 2023-09-27 DIAGNOSIS — J9621 Acute and chronic respiratory failure with hypoxia: Secondary | ICD-10-CM | POA: Diagnosis not present

## 2023-09-27 LAB — GLUCOSE, CAPILLARY
Glucose-Capillary: 107 mg/dL — ABNORMAL HIGH (ref 70–99)
Glucose-Capillary: 122 mg/dL — ABNORMAL HIGH (ref 70–99)
Glucose-Capillary: 123 mg/dL — ABNORMAL HIGH (ref 70–99)
Glucose-Capillary: 124 mg/dL — ABNORMAL HIGH (ref 70–99)
Glucose-Capillary: 137 mg/dL — ABNORMAL HIGH (ref 70–99)
Glucose-Capillary: 141 mg/dL — ABNORMAL HIGH (ref 70–99)

## 2023-09-27 LAB — COMPREHENSIVE METABOLIC PANEL WITH GFR
ALT: 17 U/L (ref 0–44)
AST: 24 U/L (ref 15–41)
Albumin: 2.2 g/dL — ABNORMAL LOW (ref 3.5–5.0)
Alkaline Phosphatase: 42 U/L (ref 38–126)
Anion gap: 7 (ref 5–15)
BUN: 18 mg/dL (ref 8–23)
CO2: 33 mmol/L — ABNORMAL HIGH (ref 22–32)
Calcium: 8.5 mg/dL — ABNORMAL LOW (ref 8.9–10.3)
Chloride: 101 mmol/L (ref 98–111)
Creatinine, Ser: 0.67 mg/dL (ref 0.44–1.00)
GFR, Estimated: >60 mL/min (ref >60)
Glucose, Bld: 129 mg/dL — ABNORMAL HIGH (ref 70–99)
Potassium: 3.6 mmol/L (ref 3.5–5.1)
Sodium: 141 mmol/L (ref 135–145)
Total Bilirubin: 0.2 mg/dL (ref 0.0–1.2)
Total Protein: 4.8 g/dL — ABNORMAL LOW (ref 6.5–8.1)

## 2023-09-27 LAB — CBC WITH DIFFERENTIAL/PLATELET
Abs Immature Granulocytes: 0.06 10*3/uL (ref 0.00–0.07)
Basophils Absolute: 0 10*3/uL (ref 0.0–0.1)
Basophils Relative: 0 %
Eosinophils Absolute: 0 10*3/uL (ref 0.0–0.5)
Eosinophils Relative: 0 %
HCT: 35.3 % — ABNORMAL LOW (ref 36.0–46.0)
Hemoglobin: 10.5 g/dL — ABNORMAL LOW (ref 12.0–15.0)
Immature Granulocytes: 1 %
Lymphocytes Relative: 5 %
Lymphs Abs: 0.6 10*3/uL — ABNORMAL LOW (ref 0.7–4.0)
MCH: 29.5 pg (ref 26.0–34.0)
MCHC: 29.7 g/dL — ABNORMAL LOW (ref 30.0–36.0)
MCV: 99.2 fL (ref 80.0–100.0)
Monocytes Absolute: 0.7 10*3/uL (ref 0.1–1.0)
Monocytes Relative: 6 %
Neutro Abs: 10.3 10*3/uL — ABNORMAL HIGH (ref 1.7–7.7)
Neutrophils Relative %: 88 %
Platelets: 216 10*3/uL (ref 150–400)
RBC: 3.56 MIL/uL — ABNORMAL LOW (ref 3.87–5.11)
RDW: 12.6 % (ref 11.5–15.5)
WBC: 11.6 10*3/uL — ABNORMAL HIGH (ref 4.0–10.5)
nRBC: 0 % (ref 0.0–0.2)

## 2023-09-27 MED ORDER — BANATROL TF EN LIQD
60.0000 mL | Freq: Two times a day (BID) | ENTERAL | Status: DC
  Administered 2023-09-27 – 2023-09-28 (×2): 60 mL
  Filled 2023-09-27 (×2): qty 60

## 2023-09-27 MED ORDER — SODIUM CHLORIDE 0.9% FLUSH
10.0000 mL | Freq: Two times a day (BID) | INTRAVENOUS | Status: DC
  Administered 2023-09-27 – 2023-10-06 (×19): 10 mL

## 2023-09-27 MED ORDER — SODIUM CHLORIDE 0.9% FLUSH: 10.0000 mL | INTRAVENOUS | Status: DC | PRN

## 2023-09-27 NOTE — Progress Notes (Signed)
 Mobility Specialist Progress Note:    09/27/23 1115  Mobility  Activity Transferred from bed to chair  Level of Assistance Minimal assist, patient does 75% or more  Assistive Device Other (Comment) (HHA)  Distance Ambulated (ft) 4 ft  Activity Response Tolerated well  Mobility Referral Yes  Mobility visit 1 Mobility  Mobility Specialist Start Time (ACUTE ONLY) 1115  Mobility Specialist Stop Time (ACUTE ONLY) 1134  Mobility Specialist Time Calculation (min) (ACUTE ONLY) 19 min   Pt received in bed, agreeable to mobility session. MinA with HHA required to stand. Tolerated well, HR in 70s during session. RN notified. Left in chair with all needs met, chair with all needs met.    Louanna Vanliew Mobility Specialist Please contact via Special educational needs teacher or  Rehab office at [phone]

## 2023-09-27 NOTE — Progress Notes (Signed)
 Nutrition Follow-up  DOCUMENTATION CODES:   Severe malnutrition in context of chronic illness, Underweight  INTERVENTION:  TF via Cortrak: Osmolite 1.5 at 66ml/hr ( per day) Prosource TF20 60 ml once daily Provides 1700 kcal, 68 gm protein, 823 ml free water  daily  Free water  flushes 200ml q4h (1200ml daily) Total free water  (TF+ FWF)-   Banatrol BID-provides 45kcal, 5g soluble fiber and 2g protein per serving.;  Monitor for goals of care. If goal is for ongoing treatment, would recommend PEG tube placement for long term nutrition support.   NUTRITION DIAGNOSIS:  Severe Malnutrition related to cancer and cancer related treatments, chronic illness as evidenced by severe fat depletion, severe muscle depletion, percent weight loss. - remains applicable  GOAL:   Patient will meet greater than or equal to 90% of their needs - goal met via TF  MONITOR:   Labs, Weight trends  REASON FOR ASSESSMENT:   Consult Enteral/tube feeding initiation and management  ASSESSMENT:   Pt admitted with severe sepsis due to CAP. PMH significant for COPD, paroxysmal afib, chronic hypoxic respiratory failure, non-small cell lung cancer with mets to the brain.  6/18- cortrak placement 6/20- MBS- pt has a severe oral dysphagia due to right hypoglossal and facial nerve impairments   SLP continues to follow with recommendation to continue NPO given findings on MBS and water  protocol as able.  ENT consulted. Discussed with pt possible plans for trach placement. Pending pt decision.  PMT following for ongoing goals of care conversation.   RN present at time of visit administering medications.  TF infusing at goal rate via Cortrak.  Pt denies nausea or vomiting.  RN mentions that pt has been having lots of loose stools and now has a rectal tube. Imodium  added by MD. RD will also add banatrol to aid in bulking.   Admit weight: 43.1 kg Current weight: 54 kg  Medications: imodium  TID,  steroids, MVI  Labs:   CBG's 107-145 x24 hours  Diet Order:   Diet Order             Diet NPO time specified  Diet effective now                   EDUCATION NEEDS:   No education needs have been identified at this time  Skin:  Skin Assessment: Reviewed RN Assessment  Last BM:  6/23 type 7 via rectal tube  Height:   Ht Readings from Last 1 Encounters:  09/23/23 5' 5 (1.651 m)    Weight:   Wt Readings from Last 1 Encounters:  09/27/23 54 kg   BMI:  Body mass index is 19.81 kg/m.  Estimated Nutritional Needs:   Kcal:  1700-1900  Protein:  80-95g  Fluid:  >/=1.5L  Royce Maris, RDN, LDN Clinical Nutrition See [facility] for contact information.

## 2023-09-27 NOTE — Progress Notes (Signed)
 Speech Language Pathology Treatment: Dysphagia  Patient Details Name: Elizabeth Mayer MRN: [id number] DOB: [birth year] Today's Date: 09/27/2023 Time:  -     Assessment / Plan / Recommendation Clinical Impression  Pt alert and talking with palliative care about treatment plan. SLP reiterated finding of MBS- severe aspiration, unable to swallow even puree without it going in the airway. Sips of water  only are advised for comfort until plan of care clear. Pt still indecisive and does not agree with diagnosis of brain tumor causing dysphagia. Insists her ENT was working with her on her voice, that all of this didn't exist during that visit. SLP tried to point out that her ENT did see severe problems and was quite concerned, which was why she ordered an MRI, which has ultimately showed the reason for her problems are this mass. Pt finally stated she doesn't trust [facility], doesn't want to be here. Could not progress pt with suggestions for water  protocol today. Will continue efforts.    HPI HPI: Pt is a 63 year old female who is admitted to [facility] on 09/19/2023 with severe sepsis due to community-acquired pneumonia after presenting from home to [facility] ED complaining of shortness of breath.  Pt initially reported hearing and swallowing changesto ENT on 08/26/23.  Right facial nerve paralysis documented as new that date as well. Pt stated at that time that her voice had been very bad for two weeks prior to appointment but also had been reporting voice changes since radiation for metastatic lung cancer in 2020 and ENT scoped pt and diagnosed with left vocal cord immobility since 2020, but now NEW bilateral vocal cord paralysis on strobeoscopy with a 2-3 mm glottic gap, right sensorineural hearing loss and new right facial nerve paralysis. Pt was scheduled for a CT neck, an MBS (that was scheduled in [place] for 6/16 - day of admission to [facility] so not completed), and strict precautions for worsening airway stridor  with concern for potential need for trach. PMH additionally significant for COPD, paroxysmal atrial fibrillation not on chronic anticoagulation, chronic hypoxic respiratory failure on 2 to 3 L continuous nasal cannula, non-small cell lung cancer with history of metastatic disease to the brain.      SLP Plan  Continue with current plan of care          Recommendations  Diet recommendations: Other(comment) (advise water  protocol)                              Continue with current plan of care     Elizabeth Mayer, Elizabeth Mayer  09/27/2023, 10:57 AM

## 2023-09-27 NOTE — Progress Notes (Signed)
 Radiation Oncology         [phone]) [phone] ________________________________  Initial inpatient Re-Consultation  Name: Elizabeth Mayer MRN: [id number]  Date: 09/28/2023  DOB: December 11, 1960  CC:Elizabeth Speaks, FNP  [name], Elizabeth Mayer   REFERRING PHYSICIAN: [name], Elizabeth Mayer  DIAGNOSIS:  No diagnosis found.  Stage IV adenocarcinoma of right lung (HCC)  Malignant neoplasm metastatic to brain (HCC)  HISTORY OF PRESENT ILLNESS::Elizabeth Mayer is a 63 y.o. female who with stage IV lung cancer and brain metastasis. Patient was previously on pemetrexed /pembrolizumab  therapy. She was last seen in office on 08/29/21 for a routine follow up.    Patient presented to the ED on 6/15 for with shortness of breath and chest pain. She was then admitted for inpatient care as she was found to be in A-fib. She underwent several testing including a chest x-ray revealing pneumonia and a chest CT demonstrating a complete right middle and lower lobe collapse.   She also underwent a Brain MRI preformed on 6/16 revealing a a heterogeneously enhancing mass along the right cerebellopontine angle cistern, measuring  36 x 18 mm with features suspicious for metastasis and associated extension into the right internal auditory canal and right hypoglossal canal.   Other pertinent imaging preformed while inpatient care include: --CT soft tissue neck on 6/18 showing no pathologically enlarged lymph nodes identified in the neck. --Barium swallow test on 6/20 showing a profound and complex dysphagia.    PREVIOUS RADIATION THERAPY: Yes  Radiation Treatment Dates: 02/11/2021 through 02/11/2021 Site Technique Total Dose (Gy) Dose per Fx (Gy) Completed Fx Beam Energies  Brain: Brain_SRS 3D 20/20 20 1/1 6XFFF    PAST MEDICAL HISTORY:  has a past medical history of Anemia, Angina (1982), Asthma, Atrial fibrillation (HCC), Complication of anesthesia, Constipation, COPD (chronic obstructive pulmonary disease) (HCC), Diabetes  mellitus without complication (HCC), Dyspnea, Dysrhythmia, Fall (06/01/2023), Fatigue, GERD (gastroesophageal reflux disease), Heart murmur (1970s), Hypertension, Ingrown toenail, Lung cancer (HCC) (10/2019), On home oxygen  therapy, Past heart attack ([id number]), Peripheral vascular disease (HCC), and Pneumonia.    PAST SURGICAL HISTORY: Past Surgical History:  Procedure Laterality Date   CERVICAL DISC SURGERY  2000   Disc removed from neck    ingrown toe nail surgery Bilateral    IR IMAGING GUIDED PORT INSERTION  02/16/2020   MULTIPLE TOOTH EXTRACTIONS     for braces   RADIOLOGY WITH ANESTHESIA N/A 12/05/2019   Procedure: MRI BRAIN WITH AND WITHOUT CONTRAST;  Surgeon: Radiologist, Medication, Mayer;  Location: [facility];  Service: Radiology;  Laterality: N/A;   RADIOLOGY WITH ANESTHESIA N/A 01/02/2020   Procedure: MRI BRAIN WITH AND WITHOUT CONTRAST;  Surgeon: Radiologist, Medication, Mayer;  Location: [facility];  Service: Radiology;  Laterality: N/A;   RADIOLOGY WITH ANESTHESIA N/A 02/20/2020   Procedure: MRI WITH ANESTHESIA OF BRAIN WITH AND WITHOUT CONTRAST;  Surgeon: Radiologist, Medication, Mayer;  Location: [facility];  Service: Radiology;  Laterality: N/A;   RADIOLOGY WITH ANESTHESIA N/A 06/11/2020   Procedure: MRI WITH ANESTHESIA OF BRAIN WITH AND WITHOUT CONTRAST;  Surgeon: Radiologist, Medication, Mayer;  [facility]: [facility];  Service: Radiology;  Laterality: N/A;   RADIOLOGY WITH ANESTHESIA N/A 10/10/2020   Procedure: MRI WITH ANESTHESIA BRAIN WITH AND WITHOUT CONTRAST;  Surgeon: Radiologist, Medication, Mayer;  [facility];  Service: Radiology;  Laterality: N/A;   RADIOLOGY WITH ANESTHESIA N/A 01/30/2021   Procedure: MRI BRAIN WITH AND WITHOUT CONTRASTWITH ANESTHESIA;  Surgeon: Radiologist, Medication, Mayer;  [facility];  Service: Radiology;  Laterality: N/A;  RADIOLOGY WITH ANESTHESIA N/A 06/12/2021   Procedure: MRI WITH ANESTHESIA OF BRAIN WITH AND WITHOUT CONTRAST;  Surgeon: Radiologist, Medication,  Mayer;  [facility];  Service: Radiology;  Laterality: N/A;   RADIOLOGY WITH ANESTHESIA N/A 09/11/2021   Procedure: MRI OF BRAIN WITH AND WITHOUT CONTRAST WITH ANESTHESIA;  Surgeon: Radiologist, Medication, Mayer;  [facility];  Service: Radiology;  Laterality: N/A;   RADIOLOGY WITH ANESTHESIA N/A 01/15/2022   Procedure: MRI BRAIN WITH AND WITHOUT CONTRAST WITH ANESTHESIA;  Surgeon: Radiologist, Medication, Mayer;  [facility];  Service: Radiology;  Laterality: N/A;   RADIOLOGY WITH ANESTHESIA N/A 05/14/2022   Procedure: MRI WITH ANESTHESIA BRAIN WITH AND WITHOUT CONTRAST;  Surgeon: Radiologist, Medication, Mayer;  [facility];  Service: Radiology;  Laterality: N/A;   THORACENTESIS Right 09/23/2023   Procedure: THORACENTESIS;  Surgeon: Mannam, Praveen, Mayer;  [facility]: [facility];  Service: Cardiopulmonary;  Laterality: Right;   TONSILLECTOMY      FAMILY HISTORY: family history includes Arthritis in her mother; Heart failure in her father.  SOCIAL HISTORY:  reports that she quit smoking about 24 years ago. Her smoking use included cigarettes. She started smoking about 45 years ago. She has a 21 pack-year smoking history. She has never used smokeless tobacco. She reports that she does not drink alcohol and does not use drugs.  ALLERGIES: Sudafed [pseudoephedrine], Amlodipine , Atorvastatin , Gabapentin, Mobic  [meloxicam ], Penicillins, and Prednisone   MEDICATIONS:  No current facility-administered medications for this encounter.   No current outpatient medications on file.   Facility-Administered Medications Ordered in Other Encounters  Medication Dose Route Frequency Provider Last Rate Last Admin   acetaminophen  (TYLENOL ) tablet 650 mg  650 mg Per Tube Q6H PRN [name], Elizabeth Mayer   650 mg at 09/27/23 2125   Or   acetaminophen  (TYLENOL ) suppository 650 mg  650 mg Rectal Q6H PRN [name], Elizabeth Mayer       acetaZOLAMIDE  (DIAMOX ) tablet 500 mg  500 mg Per Tube BID Laron Agent, RPH    500 mg at 09/27/23 2124   artificial tears (LACRILUBE) ophthalmic ointment   Both Eyes Q4H PRN Wouk, Devaughn Sayres, Mayer       benzonatate  (TESSALON ) capsule 200 mg  200 mg Oral TID PRN [name], Elizabeth Mayer       budesonide -glycopyrrolate -formoterol  (BREZTRI ) 160-9-4.8 MCG/ACT inhaler 2 puff  2 puff Inhalation BID Howerter, Justin B, DO   2 puff at 09/27/23 2029   Chlorhexidine  Gluconate Cloth 2 % PADS 6 each  6 each Topical Daily Mannam, Praveen, Mayer   6 each at 09/27/23 1033   enoxaparin  (LOVENOX ) injection 40 mg  40 mg Subcutaneous Q24H Mannam, Praveen, Mayer   40 mg at 09/27/23 1545   feeding supplement (OSMOLITE 1.5 CAL) liquid 1,000 mL  1,000 mL Per Tube Continuous Mannam, Praveen, Mayer 45 mL/hr at 09/27/23 1620 1,000 mL at 09/27/23 1620   feeding supplement (PROSource TF20) liquid 60 mL  60 mL Per Tube Daily Mannam, Praveen, Mayer   60 mL at 09/27/23 1033   fiber supplement (BANATROL TF) liquid 60 mL  60 mL Per Tube BID [name], Elizabeth Mayer   60 mL at 09/27/23 2125   free water  200 mL  200 mL Per Tube Q4H [name], Elizabeth Mayer   200 mL at 09/27/23 2010   guaiFENesin  (ROBITUSSIN) 100 MG/5ML liquid 20 mL  20 mL Per Tube TID [name], Elizabeth Mayer   20 mL at 09/27/23 2124   levalbuterol  (XOPENEX ) nebulizer solution 1.25 mg  1.25 mg Nebulization  Q4H PRN Howerter, Justin B, DO       loperamide  HCl (IMODIUM ) 1 MG/7.5ML suspension 2 mg  2 mg Per Tube TID [name], Elizabeth Mayer   2 mg at 09/27/23 2124   melatonin tablet 3 mg  3 mg Per Tube QHS PRN [name], Elizabeth Mayer       methylPREDNISolone  (MEDROL  DOSEPAK) tablet 4 mg  4 mg Per Tube 4X daily taper Laron Agent, RPH   4 mg at 09/27/23 2129   metoprolol  tartrate (LOPRESSOR ) injection 5 mg  5 mg Intravenous Q4H PRN [name], Elizabeth Mayer       multivitamin with minerals tablet 1 tablet  1 tablet Per Tube Daily Mannam, Praveen, Mayer   1 tablet at 09/27/23 1031   ondansetron  (ZOFRAN ) injection 4 mg  4 mg Intravenous Q6H PRN Howerter, Justin B,  DO       sodium chloride  (OCEAN) 0.65 % nasal spray 1 spray  1 spray Each Nare PRN [name], Elizabeth Mayer       sodium chloride  flush (NS) 0.9 % injection 10-40 mL  10-40 mL Intracatheter Q12H [name], Elizabeth Mayer   10 mL at 09/27/23 2124   sodium chloride  flush (NS) 0.9 % injection 10-40 mL  10-40 mL Intracatheter PRN [name], Elizabeth Mayer       tiZANidine  (ZANAFLEX ) tablet 2 mg  2 mg Per Tube BID PRN [name], Elizabeth Mayer        REVIEW OF SYSTEMS:  As above.   PHYSICAL EXAM:  vitals were not taken for this visit.   General: Alert and oriented, in no acute distress *** HEENT: Head is normocephalic. Extraocular movements are intact. Oropharynx is clear. Neck: Neck is supple, no palpable cervical or supraclavicular lymphadenopathy. Heart: Regular in rate and rhythm with no murmurs, rubs, or gallops. Chest: Clear to auscultation bilaterally, with no rhonchi, wheezes, or rales. Abdomen: Soft, nontender, nondistended, with no rigidity or guarding. Extremities: No cyanosis or edema. Lymphatics: see Neck Exam Skin: No concerning lesions. Musculoskeletal: symmetric strength and muscle tone throughout. Neurologic: Cranial nerves II through XII are grossly intact. No obvious focalities. Speech is fluent. Coordination is intact. Psychiatric: Judgment and insight are intact. Affect is appropriate.   LABORATORY DATA:  Lab Results  Component Value Date   WBC 11.6 (H) 09/27/2023   HGB 10.5 (L) 09/27/2023   HCT 35.3 (L) 09/27/2023   MCV 99.2 09/27/2023   PLT 216 09/27/2023   CMP     Component Value Date/Time   NA 141 09/27/2023 0420   NA 142 07/26/2023 1254   K 3.6 09/27/2023 0420   CL 101 09/27/2023 0420   CO2 33 (H) 09/27/2023 0420   GLUCOSE 129 (H) 09/27/2023 0420   BUN 18 09/27/2023 0420   BUN 16 07/26/2023 1254   CREATININE 0.67 09/27/2023 0420   CREATININE 1.28 (H) 01/08/2022 1025   CALCIUM  8.5 (L) 09/27/2023 0420   PROT 4.8 (L) 09/27/2023 0420   PROT 6.7 02/15/2023  1418   ALBUMIN  2.2 (L) 09/27/2023 0420   ALBUMIN  4.3 02/15/2023 1418   AST 24 09/27/2023 0420   AST 16 01/08/2022 1025   ALT 17 09/27/2023 0420   ALT 6 01/08/2022 1025   ALKPHOS 42 09/27/2023 0420   BILITOT 0.2 09/27/2023 0420   BILITOT 0.3 02/15/2023 1418   BILITOT 0.4 01/08/2022 1025   EGFR 81 07/26/2023 1254   GFRNONAA >60 09/27/2023 0420   GFRNONAA 47 (L) 01/08/2022 1025         RADIOGRAPHY: DG Swallowing Func-Speech  Pathology Result Date: 09/24/2023 Table formatting from the original result was not included. Modified Barium Swallow Study Patient Details Name: Elizabeth Mayer MRN: [id number] Date of Birth: 03-29-1961 Today's Date: 09/24/2023 HPI/PMH: HPI: Pt is a 63 year old female who is admitted to [facility] on 09/19/2023 with severe sepsis due to community-acquired pneumonia after presenting from home to [facility] ED complaining of shortness of breath.  Pt initially reported hearing and swallowing changesto ENT on 08/26/23.  Right facial nerve paralysis documented as new that date as well. Pt stated at that time that her voice had been very bad for two weeks prior to appointment but also had been reporting voice changes since radiation for metastatic lung cancer in 2020 and ENT scoped pt and diagnosed with left vocal cord immobility since 2020, but now NEW bilateral vocal cord paralysis on strobeoscopy with a 2-3 mm glottic gap, right sensorineural hearing loss and new right facial nerve paralysis. Pt was scheduled for a CT neck, an MBS (that was scheduled in [place] for 6/16 - day of admission to [facility] so not completed), and strict precautions for worsening airway stridor with concern for potential need for trach. PMH additionally significant for COPD, paroxysmal atrial fibrillation not on chronic anticoagulation, chronic hypoxic respiratory failure on 2 to 3 L continuous nasal cannula, non-small cell lung cancer with history of metastatic disease to the brain. Clinical Impression: Pt  demonstrates a profound and complex dysphagia. SLP mostly let pt use her own methods to eat and drink initially to evaluate her natural compensations. Throughout pt denies that she is having any trouble.   Pt has a severe oral dysphagia due to right hypoglossal and facial nerve impairments. Pt can only achieve weak negative pressure on a straw placed deep in the left buccal cavity. There are small repetitive lingual pumps to transit a small bolus, pt uses a slight posterior head tilt to transit. Cannot attempt chin tuck. Struggles with any positional changes ROM is poor and position for straw has to be quite precise.  Liquids spill to airway and pyriforms with aspiration before the swallow and inconsistent cough or throat clear. There is decreased seal of velum on posterior pharyngeal wall, decreased movement of base of tongue, incomplete epiglottic deflection, incomplete hyoid excursion, no appreciation of upper pharyngeal wall peristalsis and minimal opening of PES. There is severe residue in the pyriform sinuses with liquids. A head turn to the right does increase PES opening with liquids, but does not eliminate pooling of residue in (suspected right) pyriform. Aspiration of thin liquids only was moderate, but after a single 1/2 teaspoon of puree, which pt could not propel through oropharynx without multiple liquid washes, residue ultimately spilled into airway resulting in severe aspiration of solid and liquid mixture. Pt did have a cough response, but has no ability to expectorate aspirate up from trachea. Her cough is completely non protective.  SLP attempted to use visual feedback to explain that her coughing meant that the little bit of food and drink she had attempted had gone down the wrong pipe and could hurt her lungs.Pt again reports she swallowed fine at home. Asked pt if there is something else she does at home that helps her swallow. Pt says she does well with small bites of meat. Expressed concern  about solids like meat blocking her airway, and pt agreed soft foods are better. SLP agreed, but also pointed out that if she wanted to eat soft foods, she would have to know that  they are also going into her lungs. There were no fully effective modifications or strategies discovered on this test. Would advise letting pt sip water  after oral care for her comfort. Water  free of food particulates and with good oral hygiene may be tolerated in small sips. Recommend continuing to provide education to pt about condition and allowing her to make decisions about her preferred oral intake, though risk of negative consequences of aspiration are high. Factors that may increase risk of adverse event in presence of aspiration Noe & Lianne 2021): Factors that may increase risk of adverse event in presence of aspiration Noe & Lianne 2021): Frail or deconditioned; Weak cough; Frequent aspiration of large volumes; Aspiration of thick, dense, and/or acidic materials Recommendations/Plan: Swallowing Evaluation Recommendations Swallowing Evaluation Recommendations Recommendations: Free water  protocol after oral care Liquid Administration via: Straw Medication Administration: Via alternative means Supervision: Patient able to self-feed Swallowing strategies  : Slow rate; Small bites/sips; Head turn right during swallowing Postural changes: Position pt fully upright for meals; Stay upright 30-60 min after meals Oral care recommendations: Oral care QID (4x/day); Oral care before ice chips/water  Caregiver Recommendations: Have oral suction available Treatment Plan Treatment Plan Treatment recommendations: Therapy as outlined in treatment plan below Follow-up recommendations: Follow physicians's recommendations for discharge plan and follow up therapies Functional status assessment: Patient has had a recent decline in their functional status and demonstrates the ability to make significant improvements in function in a reasonable  and predictable amount of time. Treatment frequency: Min 2x/week Treatment duration: 2 weeks Interventions: Aspiration precaution training; Compensatory techniques; Patient/family education; Respiratory muscle strength training Recommendations Recommendations for follow up therapy are one component of a multi-disciplinary discharge planning process, led by the attending physician.  Recommendations may be updated based on patient status, additional functional criteria and insurance authorization. Assessment: Orofacial Exam: Orofacial Exam Oral Cavity - Dentition: Adequate natural dentition Anatomy: Anatomy: Suspected cervical osteophytes (C4/5) Boluses Administered: Boluses Administered Boluses Administered: Thin liquids (Level 0); Puree  Oral Impairment Domain: Oral Impairment Domain Lip Closure: No labial escape Tongue control during bolus hold: Not tested Bolus preparation/mastication: -- (NT) Bolus transport/lingual motion: Repetitive/disorganized tongue motion Oral residue: Residue collection on oral structures Location of oral residue : Floor of mouth; Lateral sulci; Tongue Initiation of pharyngeal swallow : Pyriform sinuses  Pharyngeal Impairment Domain: Pharyngeal Impairment Domain Soft palate elevation: Trace column of contrast or air between SP and PW Laryngeal elevation: Complete superior movement of thyroid  cartilage with complete approximation of arytenoids to epiglottic petiole Anterior hyoid excursion: Partial anterior movement Epiglottic movement: Partial inversion Laryngeal vestibule closure: Incomplete, narrow column air/contrast in laryngeal vestibule Pharyngeal stripping wave : Present - diminished Pharyngeal contraction (A/P view only): -- (N/T but suspect right later bulging) Pharyngoesophageal segment opening: Minimal distention/minimal duration, marked obstruction of flow Tongue base retraction: Narrow column of contrast or air between tongue base and PPW Pharyngeal residue: Majority of  contrast within or on pharyngeal structures Location of pharyngeal residue: Pyriform sinuses  Esophageal Impairment Domain: No data recorded Pill: Pill Consistency administered: -- (NT) Penetration/Aspiration Scale Score: Penetration/Aspiration Scale Score 7.  Material enters airway, passes BELOW cords and not ejected out despite cough attempt by patient: Thin liquids (Level 0); Puree 8.  Material enters airway, passes BELOW cords without attempt by patient to eject out (silent aspiration) : Thin liquids (Level 0); Puree Compensatory Strategies: Compensatory Strategies Compensatory strategies: Yes Straw: Effective Multiple swallows: Ineffective Liquid wash: Effective Effective Liquid Wash: Thin liquid (Level 0); Puree Left head turn: Ineffective  Right head turn: Effective   General Information: Caregiver present: No  Diet Prior to this Study: NPO; Cortrak/Small bore NG tube   No data recorded  Respiratory Status: WFL   Supplemental O2: Nasal cannula   No data recorded Behavior/Cognition: Alert; Cooperative Self-Feeding Abilities: Able to self-feed Baseline vocal quality/speech: Hypophonia/low volume; Abnormal resonance Volitional Cough: Able to elicit Volitional Swallow: Unable to elicit Exam Limitations: No limitations Goal Planning: Prognosis for improved oropharyngeal function: Guarded Barriers to Reach Goals: Overall medical prognosis; Severity of deficits No data recorded No data recorded Consulted and agree with results and recommendations: Patient Pain: Pain Assessment Pain Assessment: Faces Faces Pain Scale: 0 Pain Intervention(s): Monitored during session End of Session: Start Time:SLP Start Time (ACUTE ONLY): 1225 Stop Time: SLP Stop Time (ACUTE ONLY): 1257 Time Calculation:SLP Time Calculation (min) (ACUTE ONLY): 32 min Charges: SLP Evaluations $ SLP Speech Visit: 1 Visit SLP Evaluations $MBS Swallow: 1 Procedure $Swallowing Treatment: 1 Procedure SLP visit diagnosis: SLP Visit Diagnosis: Dysphagia,  oropharyngeal phase (R13.12) Past Medical History: Past Medical History: Diagnosis Date  Anemia   Angina 1982  related to stress  Asthma   in the past,  no current problems  Atrial fibrillation (HCC)   Complication of anesthesia   states anesthesia made her hair fall out, old meds. hard to awaken 1 time she took Flexeril  before.  Constipation   COPD (chronic obstructive pulmonary disease) (HCC)   Diabetes mellitus without complication (HCC)   Dyspnea   on oxygen  at home - 3L via [place]  Dysrhythmia   Fall 06/01/2023  Fatigue   GERD (gastroesophageal reflux disease)   patient denies this dx  Heart murmur 1970s  no problems currently  Hypertension   Ingrown toenail   Lung cancer (HCC) 10/2019  metastatic disease to the brain  On home oxygen  therapy   2L via [place] - 24 hours a day  Past heart attack 1980-1981  pt states she passed out and woke up in hospital- told she had heart attack, but then dr said he couldn't find anything wrong.  Peripheral vascular disease (HCC)   Pneumonia   x 1 Past Surgical History: Past Surgical History: Procedure Laterality Date  CERVICAL DISC SURGERY  2000  Disc removed from neck   ingrown toe nail surgery Bilateral   IR IMAGING GUIDED PORT INSERTION  02/16/2020  MULTIPLE TOOTH EXTRACTIONS    for braces  RADIOLOGY WITH ANESTHESIA N/A 12/05/2019  Procedure: MRI BRAIN WITH AND WITHOUT CONTRAST;  Surgeon: Radiologist, Medication, Mayer;  Location: [facility];  Service: Radiology;  Laterality: N/A;  RADIOLOGY WITH ANESTHESIA N/A 01/02/2020  Procedure: MRI BRAIN WITH AND WITHOUT CONTRAST;  Surgeon: Radiologist, Medication, Mayer;  Location: [facility];  Service: Radiology;  Laterality: N/A;  RADIOLOGY WITH ANESTHESIA N/A 02/20/2020  Procedure: MRI WITH ANESTHESIA OF BRAIN WITH AND WITHOUT CONTRAST;  Surgeon: Radiologist, Medication, Mayer;  Location: [facility];  Service: Radiology;  Laterality: N/A;  RADIOLOGY WITH ANESTHESIA N/A 06/11/2020  Procedure: MRI WITH ANESTHESIA OF BRAIN WITH AND WITHOUT CONTRAST;  Surgeon:  Radiologist, Medication, Mayer;  Location: [facility];  Service: Radiology;  Laterality: N/A;  RADIOLOGY WITH ANESTHESIA N/A 10/10/2020  Procedure: MRI WITH ANESTHESIA BRAIN WITH AND WITHOUT CONTRAST;  Surgeon: Radiologist, Medication, Mayer;  [facility]: [facility];  Service: Radiology;  Laterality: N/A;  RADIOLOGY WITH ANESTHESIA N/A 01/30/2021  Procedure: MRI BRAIN WITH AND WITHOUT CONTRASTWITH ANESTHESIA;  Surgeon: Radiologist, Medication, Mayer;  [facility]: [facility];  Service: Radiology;  Laterality: N/A;  RADIOLOGY WITH ANESTHESIA N/A  06/12/2021  Procedure: MRI WITH ANESTHESIA OF BRAIN WITH AND WITHOUT CONTRAST;  Surgeon: Radiologist, Medication, Mayer;  [facility]: [facility];  Service: Radiology;  Laterality: N/A;  RADIOLOGY WITH ANESTHESIA N/A 09/11/2021  Procedure: MRI OF BRAIN WITH AND WITHOUT CONTRAST WITH ANESTHESIA;  Surgeon: Radiologist, Medication, Mayer;  [facility]: [facility];  Service: Radiology;  Laterality: N/A;  RADIOLOGY WITH ANESTHESIA N/A 01/15/2022  Procedure: MRI BRAIN WITH AND WITHOUT CONTRAST WITH ANESTHESIA;  Surgeon: Radiologist, Medication, Mayer;  [facility]: [facility];  Service: Radiology;  Laterality: N/A;  RADIOLOGY WITH ANESTHESIA N/A 05/14/2022  Procedure: MRI WITH ANESTHESIA BRAIN WITH AND WITHOUT CONTRAST;  Surgeon: Radiologist, Medication, Mayer;  Location: [facility];  Service: Radiology;  Laterality: N/A;  TONSILLECTOMY   DeBlois, Consuelo Fitch 09/24/2023, 1:54 PM  DG CHEST PORT 1 VIEW Result Date: 09/23/2023 CLINICAL DATA:  [id number] Status post thoracentesis [id number] EXAM: PORTABLE CHEST - 1 VIEW COMPARISON:  September 22, 2023 FINDINGS: Unchanged moderate right pleural effusion with adjacent masslike consolidation in the hilar region, likely compressive atelectasis or pneumonia. Small left pleural effusion. No pneumothorax. Right chest port in place terminating in the high right atrium. Weighted feeding tube courses below the diaphragm with the distal tip not included in the field of view. Unchanged cardiac silhouette. IMPRESSION: No  significant interval change to the lungs. Electronically Signed   By: Rogelia Myers M.D.   On: 09/23/2023 12:33   CT SOFT TISSUE NECK W CONTRAST Result Date: 09/23/2023 CLINICAL DATA:  Cranial nerve palsies, multiple (CN 9-12) EXAM: CT NECK WITH CONTRAST TECHNIQUE: Multidetector CT imaging of the neck was performed using the standard protocol following the bolus administration of intravenous contrast. RADIATION DOSE REDUCTION: This exam was performed according to the departmental dose-optimization program which includes automated exposure control, adjustment of the mA and/or kV according to patient size and/or use of iterative reconstruction technique. CONTRAST:  75mL OMNIPAQUE  IOHEXOL  350 MG/ML SOLN COMPARISON:  CT neck 03/22/2020. FINDINGS: Pharynx and larynx: Normal. No mass or swelling. Salivary glands: No inflammation, mass, or stone. Thyroid : Normal. Lymph nodes: Continue decrease in bulk of previously treated posterior left lower neck lymph nodes with loss of fat planes and poor definition. No well defined/measurable index lymph nodes. Vascular: Not well assessed on this non arterial study. Major arteries are grossly patent in the neck. Limited intracranial: Partially imaged right CP angle mass, better characterized on MRI head from September 20, 2023. Visualized orbits: Not imaged. Mastoids and visualized paranasal sinuses: No mastoid effusions. Partially imaged sinuses are clear. Skeleton: No evidence of acute abnormality on limited assessment. Solid C5-C6 bony fusion. Upper chest: Please see recent CTA of the chest on September 20, 2023 for intrathoracic findings. IMPRESSION: 1. Post treatment related changes in the neck with continued treatment response and no pathologically enlarged lymph nodes identified in the neck. 2. Partially imaged right CP angle mass, better characterized on MRI head from September 20, 2023. Electronically Signed   By: Gilmore GORMAN Molt M.D.   On: 09/23/2023 01:48   CT CHEST W  CONTRAST Result Date: 09/22/2023 EXAM: CT CHEST WITH CONTRAST 09/22/2023 04:35:57 PM TECHNIQUE: CT of the chest was performed with the administration of intravenous contrast. Multiplanar reformatted images are provided for review. Automated exposure control, iterative reconstruction, and/or weight based adjustment of the mA/kV was utilized to reduce the radiation dose to as low as reasonably achievable. COMPARISON: Chest radiograph earlier today and CTA chest dated 05/23/2023. CLINICAL HISTORY: Dyspnea, chronic, unclear etiology. 64 y.o. female with past medical history of COPD, afib-  not on anticoag, chronic resp failure on 2-3 L oxygen  at home, NSCLC- s/p chemotherapy and SRS to brain- she has not had treatment since August 2023 and lung and brain disease has been stable on surveillance- she elected to defer any followup with medical oncology since 2023, and has not seen neurooncology since October 2024 - at which time there was mixed findings on brain MRI with two lesions slightly progressed and she expressed she did not want any further CNS directed treatments. She is admitted on 09/19/2023 with shortness of breath. Workup reveals sepsis due to aspiration pneumonia. She is currently in ICU on bipap. MRI of brain showed new progressing brain mass. Palliative consulted for GOC and medical decision making. She has vocal chord paralysis and likely needs tracheostomy and PEG tube if she desires continued aggressive medical. FINDINGS: MEDIASTINUM: Heart and pericardium are unremarkable. The central airways are clear. Right chest port terminating in the upper right atrium. LYMPH NODES: No mediastinal, hilar or axillary lymphadenopathy. LUNGS AND PLEURA: Complete right middle and lower lobe collapse. Moderate right pleural effusion, progressive. Small left pleural effusion, progressive. Associated left lower lobe opacity, atelectasis versus pneumonia. Mild centrilobular and paraseptal emphysematous changes, upper lung  predominant. SOFT TISSUES/BONES: Radiation changes in the right hemithorax. UPPER ABDOMEN: Enteric tube coursing into the mid stomach. Moderate upper abdominal ascites, progressive. IMPRESSION: 1. Moderate right and small left pleural effusions and moderate upper abdominal ascites, progressive. 2. Otherwise unchanged from recent CT. 3. Complete right middle and lower lobe collapse. Patchy left lower lobe opacity, atelectasis versus pneumonia. 4. Additional ancillary findings as above. Electronically signed by: Pinkie Pebbles Mayer 09/22/2023 07:52 PM EDT RP Workstation: [id number]   DG CHEST PORT 1 VIEW Result Date: 09/22/2023 CLINICAL DATA:  Pneumonia. EXAM: PORTABLE CHEST 1 VIEW COMPARISON:  09/21/2023 and CT chest 09/20/2023. FINDINGS: Trachea is midline. Heart size is grossly stable. Right IJ power port tip is in the region of the SVC RA junction or high right atrium. Collapse/consolidation in the right perihilar region and right lung base, at least some of which is post treatment related. Appearance is unchanged from yesterday's study. Left lower lobe collapse/consolidation. Bilateral pleural effusions, stable. IMPRESSION: 1. Persistent consolidation the right lower lobe is compatible with pneumonia, as on CT chest 09/20/2023. 2. Post treatment consolidation in the right perihilar region. 3. Similar left lower lobe collapse/consolidation and small left pleural effusion. Aspiration not excluded. Electronically Signed   By: Newell Eke M.D.   On: 09/22/2023 12:46   DG Chest Port 1 View Result Date: 09/21/2023 CLINICAL DATA:  63 year old female with lung cancer originally diagnosed in 2021. EXAM: PORTABLE CHEST 1 VIEW COMPARISON:  CTA chest yesterday, portable chests 09/19/2023 and earlier. FINDINGS: Portable AP semi upright view at 0641 hours. Stable lung volumes and mediastinal contours since 09/19/2023. Dense right mid and lower lung opacification appears mildly progressed since that time, right lung  apex remains aerated. Stable right chest Port-A-Cath. Confluent posterior basal segment left lower lobe collapse or consolidation redemonstrated from CTA yesterday and appears new or increased from 09/19/2023 also. Stable ventilation from the CTA scout view yesterday. No pulmonary edema. Stable paucity of bowel gas in the upper abdomen. Stable visualized osseous structures. IMPRESSION: Progressive right lung, left lung base opacification since 09/19/2023 portable x-ray, but not significantly changed from the CTA yesterday (please see that report). No new cardiopulmonary abnormality. Electronically Signed   By: VEAR Hurst M.D.   On: 09/21/2023 07:17   MR BRAIN W WO CONTRAST Result Date:  09/20/2023 CLINICAL DATA:  history brain mets and radiation, new right sided facial weakness. EXAM: MRI HEAD WITHOUT AND WITH CONTRAST TECHNIQUE: Multiplanar, multiecho pulse sequences of the brain and surrounding structures were obtained without and with intravenous contrast. Postcontrast coronal and sagittal images were not obtained due to patient's inability to complete the study. CONTRAST:  4mL GADAVIST  GADOBUTROL  1 MMOL/ML IV SOLN COMPARISON:  MRI brain 01/05/2023. FINDINGS: Brain: Heterogeneously enhancing mass along the right cerebellopontine angle cistern, measuring up to 36 x 18 mm on axial image 7 series 11, with features suspicious for metastasis. Associated extension into the right internal auditory canal and right hypoglossal canal (axial image 4 series 11). Moderate mass effect on the brainstem and right cerebellar hemisphere. Previously treated supratentorial metastases are markedly decreased in size and number. Minimal residual enhancement associated with the lesion along the left superior frontal gyrus (axial image 33 series 11). Minimal residual enhancement of the lesion along the right superior frontal gyrus (axial image 34 series 11). 3 mm of residual enhancement associated with the previously treated lesion in the  right parietal lobe. No acute infarct or hemorrhage. No hydrocephalus, extra-axial collection or midline shift. Vascular: The distal right sigmoid sinus is slightly attenuated by the right cerebellopontine angle cistern mass, but appears to remain patent. Skull and upper cervical spine: Normal marrow signal and enhancement. Sinuses/Orbits: No acute findings. Other: None. IMPRESSION: 1. Heterogeneously enhancing mass along the right cerebellopontine angle cistern, measuring up to 36 x 18 mm, with features suspicious for metastasis. Associated extension into the right internal auditory canal and right hypoglossal canal. Moderate mass effect on the brainstem and right cerebellar hemisphere. No hydrocephalus. 2. Previously treated supratentorial metastases are markedly decreased in size and number. Minimal residual enhancement associated with the lesions along the left superior frontal gyrus, right superior frontal gyrus, and right parietal lobe. Electronically Signed   By: Ryan Chess M.D.   On: 09/20/2023 19:15   DG Skull 1-3 Views Result Date: 09/20/2023 CLINICAL DATA:  Concern for foreign body EXAM: SKULL - 1-3 VIEW COMPARISON:  None Available. FINDINGS: There is no evidence of skull fracture or other focal bone lesions. IMPRESSION: No foreign body identified. Electronically Signed   By: Jackquline Boxer M.D.   On: 09/20/2023 17:58   ECHOCARDIOGRAM COMPLETE Result Date: 09/20/2023    ECHOCARDIOGRAM REPORT   Patient Name:   Elizabeth Mayer Date of Exam: 09/20/2023 Medical Rec #:  [id number]       Height:       65.0 in Accession #:    [id number]      Weight:       95.0 lb Date of Birth:  1961/01/17        BSA:          1.442 m Patient Age:    62 years        BP:           117/74 mmHg Patient Gender: F               HR:           90 bpm. Exam Location:  Inpatient Procedure: 2D Echo, Color Doppler and Cardiac Doppler (Both Spectral and Color            Flow Doppler were utilized during procedure). Indications:     I48.91* Unspeicified atrial fibrillation  History:        Patient has no prior history of Echocardiogram examinations.  Sonographer:    Eva Lash  Referring Phys: [id number] JUSTIN B HOWERTER IMPRESSIONS  1. Very mild intracavitary gradient. Peak velocity 1.03 m/s. Peak gradient 4.2 mmHg. Left ventricular ejection fraction, by estimation, is 65 to 70%. The left ventricle has normal function. The left ventricle has no regional wall motion abnormalities. There is mild concentric left ventricular hypertrophy. Left ventricular diastolic parameters are consistent with Grade I diastolic dysfunction (impaired relaxation).  2. Right ventricular systolic function is normal. The right ventricular size is normal.  3. A small pericardial effusion is present. There is no evidence of cardiac tamponade.  4. The mitral valve is normal in structure. Trivial mitral valve regurgitation. No evidence of mitral stenosis.  5. The aortic valve is normal in structure. There is mild calcification of the aortic valve. There is mild thickening of the aortic valve. Aortic valve regurgitation is mild. No aortic stenosis is present.  6. Unable to fully assess right atrial pressure. Patient unable to sniff. FINDINGS  Left Ventricle: Very mild intracavitary gradient. Peak velocity 1.03 m/s. Peak gradient 4.2 mmHg. Left ventricular ejection fraction, by estimation, is 65 to 70%. The left ventricle has normal function. The left ventricle has no regional wall motion abnormalities. The left ventricular internal cavity size was normal in size. There is mild concentric left ventricular hypertrophy. Left ventricular diastolic parameters are consistent with Grade I diastolic dysfunction (impaired relaxation). Right Ventricle: The right ventricular size is normal. No increase in right ventricular wall thickness. Right ventricular systolic function is normal. Left Atrium: Left atrial size was normal in size. Right Atrium: Right atrial size was normal in size.  Pericardium: A small pericardial effusion is present. There is excessive respiratory variation in the tricuspid valve spectral Doppler velocities. There is no evidence of cardiac tamponade. Mitral Valve: The mitral valve is normal in structure. Trivial mitral valve regurgitation. No evidence of mitral valve stenosis. Tricuspid Valve: The tricuspid valve is normal in structure. Tricuspid valve regurgitation is trivial. No evidence of tricuspid stenosis. Aortic Valve: The aortic valve is normal in structure. There is mild calcification of the aortic valve. There is mild thickening of the aortic valve. Aortic valve regurgitation is mild. No aortic stenosis is present. Aortic valve mean gradient measures 3.0 mmHg. Aortic valve peak gradient measures 7.3 mmHg. Aortic valve area, by VTI measures 2.24 cm. Pulmonic Valve: The pulmonic valve was normal in structure. Pulmonic valve regurgitation is not visualized. No evidence of pulmonic stenosis. Aorta: The aortic root is normal in size and structure. Venous: Unable to fully assess right atrial pressure. Patient unable to sniff. IAS/Shunts: No atrial level shunt detected by color flow Doppler.  LEFT VENTRICLE PLAX 2D LVIDd:         3.00 cm     Diastology LVIDs:         2.20 cm     LV e' medial:    7.93 cm/s LV PW:         1.20 cm     LV E/e' medial:  9.3 LV IVS:        1.20 cm     LV e' lateral:   13.80 cm/s LVOT diam:     1.90 cm     LV E/e' lateral: 5.3 LV SV:         57 LV SV Index:   39 LVOT Area:     2.84 cm  LV Volumes (MOD) LV vol d, MOD A2C: 61.2 ml LV vol d, MOD A4C: 60.3 ml LV vol s, MOD A2C: 16.7 ml LV vol  s, MOD A4C: 17.1 ml LV SV MOD A2C:     44.5 ml LV SV MOD A4C:     60.3 ml LV SV MOD BP:      44.4 ml RIGHT VENTRICLE RV S prime:     22.70 cm/s TAPSE (M-mode): 1.6 cm LEFT ATRIUM           Index LA diam:      2.70 cm 1.87 cm/m LA Vol (A2C): 26.1 ml 18.10 ml/m LA Vol (A4C): 28.6 ml 19.84 ml/m  AORTIC VALVE AV Area (Vmax):    2.54 cm AV Area (Vmean):   2.66  cm AV Area (VTI):     2.24 cm AV Vmax:           135.00 cm/s AV Vmean:          74.400 cm/s AV VTI:            0.253 m AV Peak Grad:      7.3 mmHg AV Mean Grad:      3.0 mmHg LVOT Vmax:         121.00 cm/s LVOT Vmean:        69.700 cm/s LVOT VTI:          0.200 m LVOT/AV VTI ratio: 0.79  AORTA Ao Asc diam: 2.70 cm MITRAL VALVE MV Area (PHT): 4.17 cm    SHUNTS MV Decel Time: 182 msec    Systemic VTI:  0.20 m MV E velocity: 73.50 cm/s  Systemic Diam: 1.90 cm MV A velocity: 69.80 cm/s MV E/A ratio:  1.05 Annabella Scarce Mayer Electronically signed by Annabella Scarce Mayer Signature Date/Time: 09/20/2023/11:09:34 AM    Final    CT Angio Chest Pulmonary Embolism (PE) W or WO Contrast Result Date: 09/20/2023 CLINICAL DATA:  63 year old female with shortness of breath. Lung cancer originally diagnosed in 2021. * Tracking Code: BO * EXAM: CT ANGIOGRAPHY CHEST WITH CONTRAST TECHNIQUE: Multidetector CT imaging of the chest was performed using the standard protocol during bolus administration of intravenous contrast. Multiplanar CT image reconstructions and MIPs were obtained to evaluate the vascular anatomy. RADIATION DOSE REDUCTION: This exam was performed according to the departmental dose-optimization program which includes automated exposure control, adjustment of the mA and/or kV according to patient size and/or use of iterative reconstruction technique. CONTRAST:  75mL OMNIPAQUE  IOHEXOL  350 MG/ML SOLN COMPARISON:  Restaging chest CT 05/19/2022 and earlier. FINDINGS: Cardiovascular: Good contrast bolus timing in the pulmonary arterial tree. Extensive enhancing chest wall and paravertebral venous collaterals. At apparent chronic partial right lower lobe pneumonectomy. Right lower lobe pulmonary artery branches appear to be chronically absent. Tapered appearance of right upper lobe branches. But no pulmonary artery filling defect identified bilaterally. Right chest Port-A-Cath is chronic. Stable thoracic aorta. Heart size  remains normal. No pericardial effusion. Mediastinum/Nodes: No convincing mediastinal mass or lymphadenopathy. Lungs/Pleura: Abnormal right lung. Chronic perihilar post radiation changes demonstrated last year with perihilar bronchiectasis. Progressive surrounding lung consolidation since that time, and new opacification of the right bronchus intermedius, abruptly and just distal to the right mainstem bronchus on series 6, image 59. Confluent opacity throughout the residual right lower and middle lobes, with air bronchograms. Bronchiectasis. Difficult to exclude early cavitation. And no enhancement of the lung parenchyma there. Superimposed adjacent right upper lobe peribronchial inflammatory appearing opacity, series 6, image 67. Contralateral left lung with centrilobular emphysema. Left upper lobe and lingula are stable from last year but there is new left lower lobe posterior basal segment confluent peribronchial opacity  with air bronchograms, but also partially enhancing lung parenchyma there. Left lung airways remain patent. Superimposed small partially sub pulmonic right pleural effusion, simple fluid density. Upper Abdomen: Grossly stable and negative visible early contrast appearance of the upper abdominal viscera. Musculoskeletal: Enhancing chest wall and paravertebral venous collaterals. No acute or suspicious osseous lesion is identified. Review of the MIP images confirms the above findings. IMPRESSION: 1. Negative for acute pulmonary embolus. 2. Positive for chronic post treatment changes to the right lung, including chronic radiation pneumonitis. But superimposed newly opacified bronchus intermedius and associated Severe New Right middle and lower lobe Consolidation. Large volume Aspiration or Necrotizing Pneumonia are not excluded. Small superimposed right pleural effusion. Early infectious involvement of the adjacent right upper lobe. And contralateral segmental consolidation in the Left Lower Lobe.  3.  Emphysema (ICD10-J43.9). Electronically Signed   By: VEAR Hurst M.D.   On: 09/20/2023 06:36   DG Chest Portable 1 View Result Date: 09/19/2023 CLINICAL DATA:  Short of breath, history of metastatic non-small cell lung cancer EXAM: PORTABLE CHEST 1 VIEW COMPARISON:  03/25/2021, 05/19/2022 FINDINGS: Single frontal view of the chest demonstrates right chest wall port tip overlying atriocaval junction. External defibrillator pads overlie the chest. Cardiac silhouette is unremarkable. There is prominence of the right hilum again noted, with new right basilar consolidation and right pleural effusion. Chronic elevation of the right hemidiaphragm. No pneumothorax. Left chest is clear. No acute bony abnormalities. IMPRESSION: 1. Right basilar consolidation and right pleural effusion, new since prior studies. I would favor acute pneumonia or aspiration, though close follow-up after treatment is recommended to exclude recurrent malignancy. 2. Stable right hilar prominence, consistent with post therapeutic changes and bronchiectasis seen on prior CT. Electronically Signed   By: Ozell Daring M.D.   On: 09/19/2023 17:31      IMPRESSION/PLAN: This is a very pleasant *** year old *** with metastatic disease to the brain.  I had a lengthy discussion with the patient after reviewing their MRI results with them.  We spoke about whole brain radiotherapy versus stereotactic radiosurgery to the brain. We spoke about the differing risks benefits and side effects of both of these treatments. During part of our discussion, we spoke about the hair loss, fatigue and cognitive effects that can result from whole brain radiotherapy.  Additionally, we spoke about radionecrosis that can result from stereotactic radiosurgery. I explained that whole brain radiotherapy is more comprehensive and therefore can decrease the chance of recurrences elsewhere in the brain, while stereotactic radiosurgery only treats the areas of gross disease  while sparing the rest of the brain parenchyma.  After lengthy discussion, the patient would like to proceed with stereotactic brain radiosurgery to their metastatic disease. They will meet with neurosurgery in the near future to discuss this further; a neurosurgeon will participate in their case.  CT simulation will take place on *** and treatment on ***.  I plan to deliver *** Gy in 1 fraction to ***.  On date of service, in total, I spent *** minutes on this encounter. Patient was seen in person.   __________________________________________   Lauraine Golden, Mayer  This document serves as a record of services personally performed by Lauraine Golden, Mayer. It was created on her behalf by Reymundo Cartwright, a trained medical scribe. The creation of this record is based on the scribe's personal observations and the provider's statements to them. This document has been checked and approved by the attending provider.

## 2023-09-27 NOTE — Consult Note (Signed)
  Psychiatry Consultation - Capacity Evaluation  Reason for Consult:Capacity evaluation for medical decision-making regarding cancer treatment in the setting of advanced Stage IV NSCLC with brain metastases.  HPI / Context:63 year old female with advanced lung cancer and new cerebellopontine metastasis. Referred for assessment of capacity to consent to cancer treatment. Patient has been minimally responsive per team and was unable to engage in goals-of-care discussion with palliative care yesterday.  Clarifying the Role of Psychiatric Consultation:It is important to distinguish between capacity to consent and the ability to provide assent. Consent requires a patient to demonstrate intact decision-making capacity, including understanding, appreciation, reasoning, and communication. Assent, however, refers to the patient's ability to express agreement or preference, even when full capacity is lacking.  In this case, the consult may reflect a need for support navigating complex decision-making, rather than a true ambiguity about capacity. The patient's ability to assent to treatment--particularly if previously expressed or documented--can ethically support proceeding with treatment in conjunction with expert medical judgment.  Formal evaluation of capacity to consent is appropriate when there is reasonable concern for impaired cognition or psychiatric symptoms affecting decision-making. Otherwise, clinical decisions may proceed using surrogate or attending-directed consent structures as outlined by [place]  law.  Capacity Assessment (AID Model):  Understanding: Patient was unable to interpret or relay back information related to her cancer diagnosis or treatment options. At times, she gestured to her head and family members interpreted this as referencing radiation treatment, but this was not clearly articulated by the patient.  Appreciation: Patient was unable to demonstrate insight into how her  current condition is affecting her health and functioning.  Reasoning: Patient lacked the ability to weigh the benefits and risks of treatment and could not provide a coherent rationale for or against treatment.  Choice: She was unable to consistently express a treatment preference during the evaluation.  Assessment:Patient was seen in bed, with a Panda tube in place, soft-spoken and whispering throughout the interview. She was frequently strangled during speech and could not communicate in full sentences without becoming breathless or needing to pause. Two brothers were present and attempted to interpret her gestures and phrases. Given her current physical condition, fatigue, and possible neurocognitive impairment due to brain metastasis, she lacks medical decision-making capacity at this time. Contributing factors may include delirium, hypoglossal dysfunction, and possible paraneoplastic processes (not yet ruled out). While she does not demonstrate full decisional capacity, she has been able to communicate clear preferences regarding select elements of care.  Most notably, patient expressed that she does not wish to be intubated or receive chest compressions. She also stated she is agreeable to PEG tube placement so she can eat. These are significant expressions of assent. Additionally, she has designated her brothers as her healthcare power of attorney, who are now involved in discussions. No overt psychotic features were observed today, though confusion remains moderate.  Recommendations:  -Patient lacks decisional capacity for complex medical treatment decisions at this time.  -She retains the ability to assent to aspects of care and has expressed specific preferences.  -Recommend further goals-of-care discussions with primary and palliative teams, utilizing her appointed healthcare proxies. -Consult chaplain for HCPOA, both brothers at bedside.   -Consider evaluation for paraneoplastic  syndromes and continued management of delirium/contributing medical factors.  -Psychiatry available to reassess capacity as needed.

## 2023-09-27 NOTE — Progress Notes (Signed)
 PT Cancellation Note  Patient Details Name: Elizabeth Mayer MRN: [id number] DOB: 20-Jun-1960   Cancelled Treatment:    Reason Eval/Treat Not Completed: (P) Other (comment) (pt and family meeting with Palliative care team, RN defer until after meeting.) Will continue efforts per PT plan of care as schedule permits.   Elizabeth Mayer 09/27/2023, 12:31 PM

## 2023-09-27 NOTE — Progress Notes (Signed)
 TRH ROUNDING NOTE RETAL TONKINSON FMW:[id number]  DOB: [birth year]  DOA: 09/19/2023  PCP: Georgina Speaks, FNP  09/27/2023,5:36 PM  LOS: 8 days    Code Status: full code   From:   home   Current Dispo: unclear   63 year old female COPD, asthma Stage IV adenocarcinoma lung--previous maintenance pemetrexed /pembrolizumab  [treatment holiday since 2023 discontinued carboplatin --had second opinion at [facility]]with brain mets prior SRS to brain mets 2021--followed also by Dr. Buckley  prior postobstructive pneumonia Recent diagnosis vocal cord paralysis ENT [facility] Dr. Katheryn Hymes--was scheduled for elective tracheostomy Chronic respiratory failure 2 to 3 L, former smoker  Admitted 6/15 with shortness of breath and found to be in A-fib with RVR new onset Workup revealed pneumonia on chest x-ray Sodium 144 potassium 5.4 BUN/creatinine 26/0.9 (baseline 16/0.8) AST 48 ALT 27 bili 1.9 BNP 123 troponin 57  lactic acid 2.0 WBC 12.8 hemoglobin 13-COVID flu RSV all negative-blood cultures collected negative--- extended RVP negative  Felt to have Dysphagia contributing from her nerve paralysis from the brain mass  6/16 MRI brain enhancing mass along right cerebellopontine cistern suspicious for metastasis moderate mass effect brainstem right cerebellar hemisphere-previous supratentorial masses are decreased in size 6/17 more lethargic transferred to ICU placed on BiPAP--palliative medicine consulted 6/19 transferred to [facility]--thoracentesis 300 cc performed right pleural space  Plan  Bradycardia secondary to Toprol  twice daily Resolved  New paroxysmal A-fib CHADVASC 4 poor candidate anticoagulation with brain mets-will consider addition after initiation XRT She is currently normal sinus rhythm-as needed IV metoprolol  Q4 > HR 120  High risk aspiration 2/2 vocal cord hypoglossal paralysis secondary to cerebellopontine mets Cannot eat safely per SLP water  trial in place Continue Unasyn  IV 3 g every 6--expected extended  duration Will get x-ray periodically to check on progression or improvement  Postobstructive pneumonia/malignant pleural effusion? Pleural culture 6/19 negative predominantly neutrophilic  COPD Refused prednisone  on Medrol  Dosepak Continue levalbuterol  1.25 every 4 as needed Robitussin 20 3 times daily h/o 2 puffs twice daily Tessalon  200 3 times daily  Stage IV NSCLC brain mets as above Discussed case with radiation oncology-trying to get XRT Patient is undecided about tracheotomy and PEG placement still see below discussions-Dr. Jesus of ENT is on standby and is willing to perform tracheotomy  Psychiatry note indicates patient lacks capacity for complex decision-making  Severe protein energy malnutrition Metabolic alkalosis, resolving hypernatremia Continue Diamox  500 twice daily, continue free water   Long discussion with the patient's brother at the bedside Christopher Lady [id number] wife has lung cancer as well and he states that he does not think that his sister has long to live and doubts that she will leave the hospital and is quite realistic about the discussions surrounding sister's care Patient herself is somewhat in denial about her prognosis and fixates on wanting to go to [facility] Patient herself has had palliative discussion and it appears that her decision-making is impaired she does not have capacity but is able to assent to certain measures I expect that her ultimate prognosis will be determined by performance post XRT to give her a chance to swallow Expect extended hospitalization    DVT prophylaxis: Lovenox   Status is: Inpatient Remains inpatient appropriate because:   Requires further management   Subjective:  Seems weak-agreeable to radiation No other changes  I'm Not sure if she understands why she cannot eat--I discussed this again with her today   Objective + exam Vitals:   09/27/23 0723 09/27/23 0754 09/27/23 1135 09/27/23 1523  BP:  ROLLEN)  93/55 (!) 114/54 (!) 117/59  Pulse: 71 74 77 75  Resp: (!) 22 20 20 20   Temp:  97.6 F (36.4 C) 97.8 F (36.6 C) 97.8 F (36.6 C)  TempSrc:  Oral Oral Oral  SpO2:  100% 99% 100%  Weight:      Height:       Filed Weights   09/25/23 0500 09/26/23 0500 09/27/23 0335  Weight: 52.8 kg 54.1 kg 54 kg    Examination:  EOMI NCAT no focal deficit no icterus no pallor Core track in place cachectic Hemifacial plegia with drawing up of the L side of mouth some ptosis on the left side S1-S2 no M, no real brady seems to be sinus now Abdomen is soft no rebound no guarding No lower extremity edema Chest decreased AE posteriorly no adventitious sounds  Data Reviewed: reviewed   CBC    Component Value Date/Time   WBC 11.6 (H) 09/27/2023 0420   RBC 3.56 (L) 09/27/2023 0420   HGB 10.5 (L) 09/27/2023 0420   HGB 11.7 07/26/2023 1254   HCT 35.3 (L) 09/27/2023 0420   HCT 36.4 07/26/2023 1254   PLT 216 09/27/2023 0420   PLT 166 07/26/2023 1254   MCV 99.2 09/27/2023 0420   MCV 92 07/26/2023 1254   MCH 29.5 09/27/2023 0420   MCHC 29.7 (L) 09/27/2023 0420   RDW 12.6 09/27/2023 0420   RDW 13.5 07/26/2023 1254   LYMPHSABS 0.6 (L) 09/27/2023 0420   LYMPHSABS 1.2 07/26/2023 1254   MONOABS 0.7 09/27/2023 0420   EOSABS 0.0 09/27/2023 0420   EOSABS 0.1 07/26/2023 1254   BASOSABS 0.0 09/27/2023 0420   BASOSABS 0.0 07/26/2023 1254      Latest Ref Rng & Units 09/27/2023    4:20 AM 09/26/2023   11:31 AM 09/25/2023    3:55 AM  CMP  Glucose 70 - 99 mg/dL 870  875  846   BUN 8 - 23 mg/dL 18  20  18    Creatinine 0.44 - 1.00 mg/dL 9.32  9.28  9.41   Sodium 135 - 145 mmol/L 141  143  143   Potassium 3.5 - 5.1 mmol/L 3.6  3.2  3.6   Chloride 98 - 111 mmol/L 101  100  99   CO2 22 - 32 mmol/L 33  37  41   Calcium  8.9 - 10.3 mg/dL 8.5  8.8  8.2   Total Protein 6.5 - 8.1 g/dL 4.8  5.3    Total Bilirubin 0.0 - 1.2 mg/dL 0.2  0.4    Alkaline Phos 38 - 126 U/L 42  49    AST 15 - 41 U/L 24  27    ALT  0 - 44 U/L 17  18      Scheduled Meds:  acetaZOLAMIDE   500 mg Per Tube BID   budesonide -glycopyrrolate -formoterol   2 puff Inhalation BID   Chlorhexidine  Gluconate Cloth  6 each Topical Daily   enoxaparin  (LOVENOX ) injection  40 mg Subcutaneous Q24H   feeding supplement (PROSource TF20)  60 mL Per Tube Daily   fiber supplement (BANATROL TF)  60 mL Per Tube BID   free water   200 mL Per Tube Q4H   guaiFENesin   20 mL Per Tube TID   loperamide  HCl  2 mg Per Tube TID   methylPREDNISolone   4 mg Per Tube 4X daily taper   multivitamin with minerals  1 tablet Per Tube Daily   sodium chloride  flush  10-40 mL Intracatheter Q12H   Continuous  Infusions:  feeding supplement (OSMOLITE 1.5 CAL) 1,000 mL (09/27/23 1620)    Time 35  [name] Orelia Brandstetter, MD  [facility]

## 2023-09-27 NOTE — Progress Notes (Signed)
 Patient ID: Elizabeth Mayer, female   DOB: June 17, 1960, 63 y.o.   MRN: [id number]    Progress Note from the Palliative Medicine Team at [facility]   Patient Name: Elizabeth Mayer        Date: 09/27/2023 DOB: 06-28-1960  Age: 63 y.o. MRN#: [id number] Attending Physician: [name], [name], MD Primary Care Physician: Georgina Speaks, FNP Admit Date: 09/19/2023   Reason for Consultation/Follow-up   Establishing Goals of Care   HPI/ Brief Hospital Review 63 y.o. female  with past medical history of COPD, afib- not on anticoag, chronic resp failure on 2-3 L oxygen  at home, NSCLC- s/p chemotherapy and SRS to brain- she has not had treatment since August 2023 and lung and brain disease has been stable on surveillance- she elected to defer any followup with medical oncology Dr. Federico since 2023, and has not seen neurooncology Dr. Buckley since October 2024 - at which time there was mixed findings on brain MRI with two lesions slightly progressed and she expressed she did not want any further CNS directed treatments admitted on 09/19/2023 with shortness of breath. Workup reveals sepsis due to aspiration pneumonia. SABRA MRI of brain showed new progressing brain mass.    Patient and family face treatment option decisions, advanced directive decisions and anticipatory care needs.   Subjective  Extensive chart review has been completed prior to meeting with patient/family  including labs, vital signs, imaging, progress/consult notes, orders, medications and available advance directive documents.    This NP assessed patient at the bedside as a follow up for palliative medicine needs and emotional support.  Although [name] is weak and voice is low day patient is stronger and able to participate in conversation.  Complex medical situation; patient with serious life limiting disease.  Stage IV NSCLC with brain metastasis.  Severe dysphagia, frailty and overall failure to thrive. ,     I was present for  conversation with speech therapy in a detail conversation regarding patient's dysphagia.  Detailed education regarding her stage IV lung cancer with brain metastasis, reviewed MRI results, patient verbalizes and does not agree with diagnosis of metastatic cancer to the brain.  Patient tells me that she thinks her thrush is the reason for her dysphagia.  She is undecided about radiation  She tells me she wants to be [facility] to [facility]  Detailed education offered on the difference between a full medical support path attempting to prolong life and palliative/supportive path allowing for natural death was offered.  Education offered on difference between a full medical support path attempting to prolong life and a palliative comfort path allowing for a natural death.                Later in the afternoon family arrived at the bedside.  I was able to speak to her brothers Ramene Corinthia and Almonre Alston.   Again detailed education regarding seriousness of the current medical situation.  Education offered on the outstanding decisions facing [name]; CODE STATUS, artificial feeding and hydration, radiation treatment and H POA.  We explored capacity and competency.  On further exploration of patient past mental health, her family report periods of fixation and paranoia.    Education offered on hospice benefit; philosophy and eligibility.   Detailed IP hospice option.  After very detailed conversation:  Plan of Care: -DNR/DNI limited-- order placed - ongoing discussion regarding continued artifical feeding    ( Risk and benefits of PEG detailed) -patient and family interested in  meeting with Rad-Onc for more information regarding radiation options - secure HPOA- will consult Spiritual care    ( Established with  the patient she would like her brothers Ramene Corinthia and Almonre Alston be her HPOAs.) -PMT will f/u again tomorrow   .     Discussed with primary attending , Rad-Onc,  Psychiatry and Speech and nursing staff  PMT will continue to support holistically   Time:  120 minutes  Detailed review of medical records ( labs, imaging, vital signs), medically appropriate exam ( MS, skin, cardiac,  resp)   discussed with treatment team, counseling and education to patient, family, staff, documenting clinical information, medication management, coordination of care    Ronal Plants NP  Palliative Medicine Team Team Phone # [phone] Pager [phone]

## 2023-09-28 ENCOUNTER — Ambulatory Visit
Admit: 2023-09-28 | Discharge: 2023-09-28 | Disposition: A | Discharge status: Home / Self Care | Attending: Radiation Oncology | Admitting: Radiation Oncology

## 2023-09-28 DIAGNOSIS — Z515 Encounter for palliative care: Secondary | ICD-10-CM | POA: Diagnosis not present

## 2023-09-28 DIAGNOSIS — R531 Weakness: Secondary | ICD-10-CM

## 2023-09-28 DIAGNOSIS — R1312 Dysphagia, oropharyngeal phase: Secondary | ICD-10-CM | POA: Diagnosis not present

## 2023-09-28 DIAGNOSIS — C3490 Malignant neoplasm of unspecified part of unspecified bronchus or lung: Secondary | ICD-10-CM | POA: Diagnosis not present

## 2023-09-28 DIAGNOSIS — R627 Adult failure to thrive: Secondary | ICD-10-CM

## 2023-09-28 DIAGNOSIS — J189 Pneumonia, unspecified organism: Secondary | ICD-10-CM | POA: Diagnosis not present

## 2023-09-28 DIAGNOSIS — C7931 Secondary malignant neoplasm of brain: Secondary | ICD-10-CM

## 2023-09-28 LAB — CBC WITH DIFFERENTIAL/PLATELET
Abs Immature Granulocytes: 0.05 10*3/uL (ref 0.00–0.07)
Basophils Absolute: 0 10*3/uL (ref 0.0–0.1)
Basophils Relative: 0 %
Eosinophils Absolute: 0 10*3/uL (ref 0.0–0.5)
Eosinophils Relative: 0 %
HCT: 33.8 % — ABNORMAL LOW (ref 36.0–46.0)
Hemoglobin: 10 g/dL — ABNORMAL LOW (ref 12.0–15.0)
Immature Granulocytes: 1 %
Lymphocytes Relative: 4 %
Lymphs Abs: 0.4 10*3/uL — ABNORMAL LOW (ref 0.7–4.0)
MCH: 29.4 pg (ref 26.0–34.0)
MCHC: 29.6 g/dL — ABNORMAL LOW (ref 30.0–36.0)
MCV: 99.4 fL (ref 80.0–100.0)
Monocytes Absolute: 0.4 10*3/uL (ref 0.1–1.0)
Monocytes Relative: 4 %
Neutro Abs: 9.3 10*3/uL — ABNORMAL HIGH (ref 1.7–7.7)
Neutrophils Relative %: 91 %
Platelets: 246 10*3/uL (ref 150–400)
RBC: 3.4 MIL/uL — ABNORMAL LOW (ref 3.87–5.11)
RDW: 13 % (ref 11.5–15.5)
WBC: 10.2 10*3/uL (ref 4.0–10.5)
nRBC: 0 % (ref 0.0–0.2)

## 2023-09-28 LAB — COMPREHENSIVE METABOLIC PANEL WITH GFR
ALT: 21 U/L (ref 0–44)
AST: 27 U/L (ref 15–41)
Albumin: 2.3 g/dL — ABNORMAL LOW (ref 3.5–5.0)
Alkaline Phosphatase: 47 U/L (ref 38–126)
Anion gap: 8 (ref 5–15)
BUN: 14 mg/dL (ref 8–23)
CO2: 32 mmol/L (ref 22–32)
Calcium: 8.2 mg/dL — ABNORMAL LOW (ref 8.9–10.3)
Chloride: 102 mmol/L (ref 98–111)
Creatinine, Ser: 0.67 mg/dL (ref 0.44–1.00)
GFR, Estimated: >60 mL/min (ref >60)
Glucose, Bld: 140 mg/dL — ABNORMAL HIGH (ref 70–99)
Potassium: 3.7 mmol/L (ref 3.5–5.1)
Sodium: 142 mmol/L (ref 135–145)
Total Bilirubin: 0.2 mg/dL (ref 0.0–1.2)
Total Protein: 5.1 g/dL — ABNORMAL LOW (ref 6.5–8.1)

## 2023-09-28 LAB — GLUCOSE, CAPILLARY
Glucose-Capillary: 101 mg/dL — ABNORMAL HIGH (ref 70–99)
Glucose-Capillary: 106 mg/dL — ABNORMAL HIGH (ref 70–99)
Glucose-Capillary: 113 mg/dL — ABNORMAL HIGH (ref 70–99)
Glucose-Capillary: 123 mg/dL — ABNORMAL HIGH (ref 70–99)
Glucose-Capillary: 133 mg/dL — ABNORMAL HIGH (ref 70–99)
Glucose-Capillary: 135 mg/dL — ABNORMAL HIGH (ref 70–99)
Glucose-Capillary: 139 mg/dL — ABNORMAL HIGH (ref 70–99)

## 2023-09-28 MED ORDER — GUAIFENESIN 100 MG/5ML PO LIQD
20.0000 mL | Freq: Three times a day (TID) | ORAL | Status: DC
  Administered 2023-09-28 – 2023-10-04 (×18): 20 mL via ORAL
  Filled 2023-09-28 (×21): qty 20

## 2023-09-28 MED ORDER — FREE WATER: 100.0000 mL | Status: DC

## 2023-09-28 MED ORDER — GLYCOPYRROLATE 0.2 MG/ML IJ SOLN
0.4000 mg | Freq: Three times a day (TID) | INTRAMUSCULAR | Status: DC
  Administered 2023-09-28 – 2023-10-04 (×18): 0.4 mg via INTRAVENOUS
  Filled 2023-09-28 (×18): qty 2

## 2023-09-28 MED ORDER — OSMOLITE 1.5 CAL PO LIQD
1000.0000 mL | ORAL | Status: DC
  Administered 2023-09-28: 1000 mL
  Filled 2023-09-28: qty 1000

## 2023-09-28 MED ORDER — ACETAMINOPHEN 325 MG PO TABS
650.0000 mg | ORAL_TABLET | Freq: Four times a day (QID) | ORAL | Status: DC | PRN
  Administered 2023-09-29 – 2023-10-02 (×4): 650 mg via ORAL
  Filled 2023-09-28 (×4): qty 2

## 2023-09-28 MED ORDER — ACETAMINOPHEN 650 MG RE SUPP
650.0000 mg | Freq: Four times a day (QID) | RECTAL | Status: DC | PRN
Start: 2023-09-28 — End: 2023-10-05

## 2023-09-28 MED ORDER — LOPERAMIDE HCL 1 MG/7.5ML PO SUSP
2.0000 mg | Freq: Three times a day (TID) | ORAL | Status: DC
  Administered 2023-09-28 – 2023-10-01 (×7): 2 mg via ORAL
  Filled 2023-09-28 (×10): qty 15

## 2023-09-28 MED ORDER — PROSOURCE TF20 ENFIT COMPATIBL EN LIQD: 30.0000 mL | Freq: Every day | ENTERAL | Status: DC

## 2023-09-28 MED ORDER — LORAZEPAM 2 MG/ML IJ SOLN: 0.5000 mg | Freq: Four times a day (QID) | INTRAMUSCULAR | Status: DC | PRN

## 2023-09-28 MED ORDER — LORAZEPAM 2 MG/ML IJ SOLN
1.0000 mg | Freq: Once | INTRAMUSCULAR | Status: DC
  Filled 2023-09-28: qty 1

## 2023-09-28 MED ORDER — MORPHINE SULFATE (PF) 2 MG/ML IV SOLN: 1.0000 mg | INTRAVENOUS | Status: DC | PRN

## 2023-09-28 MED ORDER — OSMOLITE 1.5 CAL PO LIQD
1000.0000 mL | ORAL | Status: DC
  Administered 2023-09-28: 1000 mL
  Filled 2023-09-28 (×2): qty 1000

## 2023-09-28 NOTE — Progress Notes (Signed)
 Speech Language Pathology Treatment: Dysphagia  Patient Details Name: Elizabeth Mayer MRN: [id number] DOB: [birth year] Today's Date: 09/28/2023 Time: 8867-8849 SLP Time Calculation (min) (ACUTE ONLY): 18 min  Assessment / Plan / Recommendation Clinical Impression  Given MD note mentioning water  protocol, SLP proceeded with educating pt and RN about water  protocol parameters. After brushing teeth and suctioning mouth carefully, pt can have small sips of water  for comfort and to access swallowing mechanism. Pt demonstrates ability to place straw in buccal cavity to achieve a small amount of oral negative pressure and draw up fluid. Pt is able to initiate multiple instances of laryngeal elevation and likely partial opening of UES (based on prior MBS results) to attempt swallows of water . Pt did have slight throat clearing. In MBS once pyriforms fill up partially aspiration events may occur. Advised pt to stop when she has a cough. Pt is at risk of aspiration, but small quantities of water  provided after oral care pose minimal risk of airway compromise and coud significantly impact pts tolerance of otherwise NPO status.    HPI HPI: Pt is a 63 year old female who is admitted to [facility] on 09/19/2023 with severe sepsis due to community-acquired pneumonia after presenting from home to [facility] ED complaining of shortness of breath.  Pt initially reported hearing and swallowing changesto ENT on 08/26/23.  Right facial nerve paralysis documented as new that date as well. Pt stated at that time that her voice had been very bad for two weeks prior to appointment but also had been reporting voice changes since radiation for metastatic lung cancer in 2020 and ENT scoped pt and diagnosed with left vocal cord immobility since 2020, but now NEW bilateral vocal cord paralysis on strobeoscopy with a 2-3 mm glottic gap, right sensorineural hearing loss and new right facial nerve paralysis. Pt was scheduled for a CT neck,  an MBS (that was scheduled in [place] for 6/16 - day of admission to [facility] so not completed), and strict precautions for worsening airway stridor with concern for potential need for trach. PMH additionally significant for COPD, paroxysmal atrial fibrillation not on chronic anticoagulation, chronic hypoxic respiratory failure on 2 to 3 L continuous nasal cannula, non-small cell lung cancer with history of metastatic disease to the brain.      SLP Plan  Continue with current plan of care          Recommendations  Diet recommendations: Other(comment) (water  protocol - sips of water  within 30 minutes of oral care) Liquids provided via: Straw                  Oral care prior to ice chip/H20;Oral care before and after PO           Continue with current plan of care     Rain Friedt, Consuelo Fitch  09/28/2023, 1:31 PM

## 2023-09-28 NOTE — Progress Notes (Signed)
 PT Cancellation Note  Patient Details Name: Elizabeth Mayer MRN: [id number] DOB: November 21, 1960   Cancelled Treatment:    Reason Eval/Treat Not Completed: Patient not medically ready (Palliative NP defer PT tx at this time.) Will continue efforts per PT plan of care as schedule permits if still indicated. Will defer to care team at this time.   Connell HERO Bryah Ocheltree 09/28/2023, 2:14 PM

## 2023-09-28 NOTE — Progress Notes (Addendum)
 This chaplain responded to PMT [facility] consult for creating/updating the Pt. Advance Directive. The Pt. received a Pt. update from NP-Mary outside the room and will attempt a revisit with PMT referral.  **[id number] This chaplain is present with the Pt., Pt. brother-Remene, NP-Mary, notary, and witnesses for the notarizing of the Pt. AD, HCPOA only. The Pt. answered clarifying questions after AD education with the NP.  The Pt. named Remene Alston as his healthcare agent. If this person is unable or unwilling to serve in this role the Pt. next choice is Almon-Re.  The Pt. third choice is Christopher Lady.  The chaplain gave the original AD along with three copies to the Pt.. The chaplain scanned the Pt. AD: HCPOA only, into the Pt. EMR.  This chaplain is available for F/U spiritual care as needed.   Chaplain Leeroy Hummer [phone]

## 2023-09-28 NOTE — TOC Progression Note (Signed)
 Transition of Care [facility]) - Progression Note    Patient Details  Name: Elizabeth Mayer MRN: [id number] Date of Birth: 1960/05/20  Transition of Care [facility]) CM/SW Contact  Elizabeth Mayer, [place] Phone Number: 09/28/2023, 10:19 AM  Clinical Narrative:     Received call from [facility] APS Isabel Sharps # [phone]- returned call and provided update and clinicals   Elizabeth Mayer, MSW, LCSW Clinical Social Worker          Expected Discharge Plan and Services                                               Social Determinants of Health (SDOH) Interventions SDOH Screenings   Food Insecurity: No Food Insecurity (09/22/2023)  Housing: Low Risk  (09/22/2023)  Transportation Needs: No Transportation Needs (09/22/2023)  Utilities: Not At Risk (09/22/2023)  Alcohol Screen: Low Risk  (02/15/2023)  Depression (PHQ2-9): Low Risk  (02/15/2023)  Financial Resource Strain: Low Risk  (02/15/2023)  Physical Activity: Sufficiently Active (02/15/2023)  Social Connections: Moderately Isolated (02/15/2023)  Stress: No Stress Concern Present (02/15/2023)  Tobacco Use: Medium Risk (09/19/2023)  Health Literacy: Adequate Health Literacy (02/15/2023)    Readmission Risk Interventions     No data to display
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
## 2023-09-28 NOTE — Progress Notes (Signed)
 Patient ID: Elizabeth Mayer, female   DOB: [birth year], 63 y.o.   MRN: [id number]    Progress Note from the Palliative Medicine Team at [facility]   Patient Name: Elizabeth Mayer        Date: 09/28/2023 DOB: Apr 14, 1960  Age: 63 y.o. MRN#: [id number] Attending Physician: [name], [name], MD Primary Care Physician: Elizabeth Speaks, FNP Admit Date: 09/19/2023   Reason for Consultation/Follow-up   Establishing Goals of Care   HPI/ Brief Hospital Review 63 y.o. female  with past medical history of COPD, afib- not on anticoag, chronic resp failure on 2-3 L oxygen  at home, NSCLC- s/p chemotherapy and SRS to brain- she has not had treatment since August 2023 and lung and brain disease has been stable on surveillance- she elected to defer any followup with medical oncology Elizabeth Mayer since 2023, and has not seen neuro-oncology Elizabeth Mayer since October 2024 - at which time there was mixed findings on brain MRI with two lesions slightly progressed and she expressed she did not want any further CNS directed treatments admitted on 09/19/2023 with shortness of breath. Workup reveals sepsis due to aspiration pneumonia. Elizabeth Mayer MRI of brain showed new progressing brain mass.   MRI 09/20/2023       Severe dysphagia and nerve paralysis-- significant for heterogeneously enhancing mass along the right cerebellopontine angle cistern, measuring  36 x 18 mm with features suspicious for metastasis and associated extension into the right internal auditory canal and right hypoglossal canal.   Cor- track--- for nutritional support  09-27-23 --psychiatry consult-patient lacks capacity for complex medical treatment decisions she has the ability to assent to aspects of care.    Patient and family face treatment option decisions, advanced directive decisions and anticipatory care needs.   Subjective  Extensive chart review has been completed prior to meeting with patient/family  including labs, vital signs, imaging,  progress/consult notes, orders, medications and available advance directive documents.    This NP assessed patient at the bedside as a follow up for palliative medicine needs and emotional support.   And to meet with patient's brother/ Elizabeth Mayer  for for further clarification of goals of care, specific to treatment option decisions, advanced directive decisions and anticipatory care needs.   Elizabeth Mayer remains weak, her voice is low however she is able to participate in conversation.       In collaboration with spiritual care we were able to secure H POA today.  Document scanned for EMR  Education offered on the complex medical situation; patient with serious life limiting disease.  Stage IV NSCLC with brain metastasis.  Severe dysphagia, frailty and overall failure to thrive.  Today again we explored concepts specific to desire to move forward with radiation treatment, ongoing artificial feeding and hydration, and anticipatory care needs, end-of-life wishes, disposition and options        Patient's brother/ Elizabeth Mayer had long conversation with radiation oncology this morning.  He understands the serious life limiting illness and limited prognosis.  Education offered on the difference between a full medical support path attempting to prolong life versus a palliative/supportive path focusing on comfort and dignity allowing for natural death.     Today Elizabeth Mayer's brother shares with Elizabeth Mayer that he and his other 2 brothers have discussed and  contemplated her situation in detail.   All believe  the best approach to care at this time is to focus on comfort and dignity excepting hospice services.  He appreciates his love and appreciation  for her and wants her to know he only wants the best for her  After contemplation, patient clearly communicates with her brother that she agrees.  Comfort, quality and dignity or focus of care allowing for natural death  Education offered on hospice benefit;  philosophy and eligibility.  Education offered on inpatient hospice unit, family is familiar with [employer]   Plan of care - DNR/DNI - No PEG-family wish to continue with core track longer - will begin to  wean down tube feed and free water  rate /decrease rate by 50%  ( will need to discuss with [facility] family's hope to continue with trickle feeds at least for a little while longer in the  IPU)          - ice chips for comfort - Continue current meds via tube--can be discontinued when transitioned to IPU - No further diagnostics or treatment for underlying cancer diagnosis, forego radiation - no further lab draws - MOST form completed to reflect full comfort -  IPU for end-of-life care  Education offered on the natural trajectory and expectations at end-of-life, questions and concerns addressed.    Discussed with primary attending , Rad-Onc, Psychiatry and Speech and nursing staff  PMT will continue to support holistically   Time:  65  minutes  Detailed review of medical records ( labs, imaging, vital signs), medically appropriate exam ( MS, skin, cardiac,  resp)   discussed with treatment team, counseling and education to patient, family, staff, documenting clinical information, medication management, coordination of care    Elizabeth Plants NP  Palliative Medicine Team Team Phone # [phone] Pager [phone]

## 2023-09-28 NOTE — Progress Notes (Signed)
 [facility] ROUNDING NOTE Elizabeth Mayer FMW:[id number]  DOB: 02/20/1961  DOA: 09/19/2023  PCP: Georgina Speaks, FNP  09/28/2023,4:06 PM  LOS: 9 days    Code Status: full code   From:   home   Current Dispo: unclear   63 year old female COPD, asthma Stage IV adenocarcinoma lung--previous maintenance pemetrexed /pembrolizumab  [treatment holiday since 2023 discontinued carboplatin --had second opinion at [facility]]with brain mets prior SRS to brain mets 2021--followed also by Dr. Buckley  prior postobstructive pneumonia Recent diagnosis vocal cord paralysis ENT [facility] Dr. Katheryn Hymes--was scheduled for elective tracheostomy Chronic respiratory failure 2 to 3 L, former smoker  Admitted 6/15 with shortness of breath and found to be in A-fib with RVR new onset Workup revealed pneumonia on chest x-ray Sodium 144 potassium 5.4 BUN/creatinine 26/0.9 (baseline 16/0.8) AST 48 ALT 27 bili 1.9 BNP 123 troponin 57  lactic acid 2.0 WBC 12.8 hemoglobin 13-COVID flu RSV all negative-blood cultures collected negative--- extended RVP negative  Felt to have Dysphagia contributing from her nerve paralysis from the brain mass  6/16 MRI brain enhancing mass along right cerebellopontine cistern suspicious for metastasis moderate mass effect brainstem right cerebellar hemisphere-previous supratentorial masses are decreased in size 6/17 more lethargic transferred to ICU placed on BiPAP--palliative medicine consulted 6/19 transferred to [facility]--thoracentesis 300 cc performed right pleural space 6/24 after discussion with family HCPOA-transition to comfort hopeful we can place  Plan  Bradycardia secondary to Toprol  twice daily Resolved  New paroxysmal A-fib CHADVASC 4 poor candidate anticoagulation with brain mets-will consider addition after initiation XRT She is currently normal sinus rhythm-stop beta-blocker  High risk aspiration 2/2 vocal cord hypoglossal paralysis secondary to cerebellopontine mets Cannot eat safely per SLP   Continue medications for comfort morphine 1 mg every 3 as needed, Ativan  0.5 every 6 as needed glycopyrrolate  0.4 3 times daily  Postobstructive pneumonia/malignant pleural effusion? Pleural culture 6/19 negative predominantly neutrophilic  COPD Refused prednisone  on Medrol  Dosepak Continue levalbuterol  1.25 every 4 as needed Robitussin 20 3 times daily h/o 2 puffs twice daily Tessalon  200 3 times daily  Stage IV NSCLC brain mets as above Discussed case with radiation oncology-not a candidate for XRT  Psychiatry note indicates patient lacks capacity for complex decision-making--surrogate decision maker  Severe protein energy malnutrition Stop lab  Long discussion with the patient's brother at the bedside Christopher Lady [phone] notes from palliative care In addition to full comfort with transfer to [facility] hopefully in the next 24 hours    DVT prophylaxis: Lovenox   Status is: Inpatient Remains inpatient appropriate because:   Requires further management   Subjective:  Seems somewhat weak happy to be off steroids does not seem to recall some of the conversations per extensive note for palliative care-discussed with palliative care NP who states that they had a long discussion about goals and patient was agreeable at that time Surrogate decision maker Brother agrees with DNR  Objective + exam Vitals:   09/28/23 0334 09/28/23 0740 09/28/23 0743 09/28/23 1118  BP: (!) 94/50 122/64 (!) 110/55 (!) 99/49  Pulse: 69 73 71 76  Resp: 18 20 17 19   Temp: 98.1 F (36.7 C) 97.8 F (36.6 C) 97.6 F (36.4 C) 97.6 F (36.4 C)  TempSrc: Oral Oral Oral Oral  SpO2: 100% 95% 100% 100%  Weight:      Height:       Filed Weights   09/25/23 0500 09/26/23 0500 09/27/23 0335  Weight: 52.8 kg 54.1 kg 54 kg    Examination:  EOMI NCAT  no focal deficit no icterus no pallor Core track in place cachectic Hemifacial plegia with drawing up of the L side of mouth some  ptosis on the left side S1-S2 no M No Lower extremity edema power 5/5 but limited   Data Reviewed: reviewed   CBC    Component Value Date/Time   WBC 10.2 09/28/2023 0430   RBC 3.40 (L) 09/28/2023 0430   HGB 10.0 (L) 09/28/2023 0430   HGB 11.7 07/26/2023 1254   HCT 33.8 (L) 09/28/2023 0430   HCT 36.4 07/26/2023 1254   PLT 246 09/28/2023 0430   PLT 166 07/26/2023 1254   MCV 99.4 09/28/2023 0430   MCV 92 07/26/2023 1254   MCH 29.4 09/28/2023 0430   MCHC 29.6 (L) 09/28/2023 0430   RDW 13.0 09/28/2023 0430   RDW 13.5 07/26/2023 1254   LYMPHSABS 0.4 (L) 09/28/2023 0430   LYMPHSABS 1.2 07/26/2023 1254   MONOABS 0.4 09/28/2023 0430   EOSABS 0.0 09/28/2023 0430   EOSABS 0.1 07/26/2023 1254   BASOSABS 0.0 09/28/2023 0430   BASOSABS 0.0 07/26/2023 1254      Latest Ref Rng & Units 09/28/2023    4:30 AM 09/27/2023    4:20 AM 09/26/2023   11:31 AM  CMP  Glucose 70 - 99 mg/dL 859  870  875   BUN 8 - 23 mg/dL 14  18  20    Creatinine 0.44 - 1.00 mg/dL 9.32  9.32  9.28   Sodium 135 - 145 mmol/L 142  141  143   Potassium 3.5 - 5.1 mmol/L 3.7  3.6  3.2   Chloride 98 - 111 mmol/L 102  101  100   CO2 22 - 32 mmol/L 32  33  37   Calcium  8.9 - 10.3 mg/dL 8.2  8.5  8.8   Total Protein 6.5 - 8.1 g/dL 5.1  4.8  5.3   Total Bilirubin 0.0 - 1.2 mg/dL 0.2  0.2  0.4   Alkaline Phos 38 - 126 U/L 47  42  49   AST 15 - 41 U/L 27  24  27    ALT 0 - 44 U/L 21  17  18      Scheduled Meds:  acetaZOLAMIDE   500 mg Per Tube BID   budesonide -glycopyrrolate -formoterol   2 puff Inhalation BID   Chlorhexidine  Gluconate Cloth  6 each Topical Daily   feeding supplement (OSMOLITE 1.5 CAL)  1,000 mL Per Tube Q24H   free water   100 mL Per Tube Q4H   glycopyrrolate   0.4 mg Intravenous TID   guaiFENesin   20 mL Per Tube TID   loperamide  HCl  2 mg Per Tube TID   LORazepam   1-2 mg Intravenous Once   methylPREDNISolone   4 mg Per Tube 4X daily taper   sodium chloride  flush  10-40 mL Intracatheter Q12H    Continuous Infusions:    Time 35  Colen Grimes, MD  [facility]

## 2023-09-29 DIAGNOSIS — D496 Neoplasm of unspecified behavior of brain: Secondary | ICD-10-CM

## 2023-09-29 DIAGNOSIS — J690 Pneumonitis due to inhalation of food and vomit: Secondary | ICD-10-CM

## 2023-09-29 DIAGNOSIS — C3490 Malignant neoplasm of unspecified part of unspecified bronchus or lung: Secondary | ICD-10-CM | POA: Diagnosis not present

## 2023-09-29 DIAGNOSIS — A419 Sepsis, unspecified organism: Secondary | ICD-10-CM | POA: Diagnosis not present

## 2023-09-29 DIAGNOSIS — R627 Adult failure to thrive: Secondary | ICD-10-CM | POA: Diagnosis not present

## 2023-09-29 DIAGNOSIS — J189 Pneumonia, unspecified organism: Secondary | ICD-10-CM | POA: Diagnosis not present

## 2023-09-29 LAB — GLUCOSE, CAPILLARY: Glucose-Capillary: 97 mg/dL (ref 70–99)

## 2023-09-29 MED ORDER — LIDOCAINE 5 % EX PTCH
2.0000 | MEDICATED_PATCH | CUTANEOUS | Status: DC
  Administered 2023-09-29 – 2023-10-06 (×8): 2 via TRANSDERMAL
  Filled 2023-09-29 (×8): qty 2

## 2023-09-29 MED ORDER — MORPHINE SULFATE (PF) 2 MG/ML IV SOLN
2.0000 mg | INTRAVENOUS | Status: DC | PRN
  Administered 2023-10-03: 2 mg via INTRAVENOUS
  Filled 2023-09-29 (×3): qty 1

## 2023-09-29 NOTE — Progress Notes (Signed)
 [facility] Progress Note Patient: Elizabeth Mayer FMW:[id number] DOB: 01-08-1961 DOA: 09/19/2023  DOS: the patient was seen and examined on 09/29/2023  Brief Hospital Course: PMH of COPD, chronic respiratory failure, non-small cell lung cancer SP chemotherapy and radiation with brain metastasis presented with dysphagia.  Found to have heterogeneously enhancing mass at the right CP angle cistern. Psychiatry palliative care consulted. Currently comfort care. Assessment and Plan: Acute hypoxic and hypercapnic and hypoxic respiratory failure.   Severe sepsis secondary to pneumonia. Due to aspiration pneumonia from bilateral vocal cord paralysis, also of a brain mass.  Required ICU admission for closer monitoring but did not require intubation. Remains lethargic now.  Treated with antibiotic. Now comfort care.  Recently diagnosed bilateral vocal cord paralysis, underlying dysphagia - she follows with Dr. Katheryn Fort, ENT at [facility], case discussed with her this morning, patient was to get elective tracheostomy,   History of lung cancer, brain mass, facial droop.   MRI shows new cerebellopontine angle mass. Likely responsible for the paralysis. Oncology was consulted. Radiation was offered as a potential option although would not prolong the prognosis. Family decided to transition to comfort although gradual introduction.   Paroxysmal A-fib RVR.   New diagnosis.  Beta-blocker, not anticoagulation candidate due to brain mass diagnosed on MRI.  Mild demand ischemia from RVR and hypoxia, EKG nonischemic, not anticoagulation candidate due to brain mass.  Supportive care.   COPD.  Supportive care, on steroids, antibiotics and supplemental oxygen , now intubation.   Troponin rise.   Likely demand ischemia from A-fib RVR,   Capacity evaluation. Psychiatry was consulted. Patient does not have capacity to understand medical decision making.  Severe protein-calorie malnutrition Body mass  index is 19.81 kg/m.  Nutrition Problem: Severe Malnutrition Etiology: cancer and cancer related treatments, chronic illness Nutrition Interventions: Interventions: Refer to RD note for recommendations   Subjective: Remains comfortable.  No nausea or vomiting.  Reports back pain though.  Physical Exam: General: in Mild distress, No Rash Cardiovascular: S1 and S2 Present, No Murmur Respiratory: Good respiratory effort, Bilateral Air entry present. No Crackles, No wheezes Abdomen: Bowel Sound present, No tenderness Extremities: No edema Neuro: Fatigued but alert and oriented self, no new focal deficit  Data Reviewed: I have Reviewed nursing notes, Vitals, and Lab results. Discussed with palliative care.  Disposition: Status is: Inpatient Awaiting hospice referral   Family Communication: No one at bedside Level of care: Palliative Care   Vitals:   09/29/23 0247 09/29/23 0747 09/29/23 1231 09/29/23 1541  BP: 107/61 (!) 98/50 (!) 100/56 (!) 103/57  Pulse: 75     Resp: 18 19 20 15   Temp: 98 F (36.7 C) 97.6 F (36.4 C) (!) 97.5 F (36.4 C) 98.2 F (36.8 C)  TempSrc: Oral Axillary Axillary Oral  SpO2: 100%     Weight:      Height:         Author: Yetta Blanch, MD 09/29/2023 7:49 PM  Please look on [email or web address] to find out who is on call.

## 2023-09-29 NOTE — Progress Notes (Signed)
 [facility] 4E25 - [employer] engineer, contracting Hospice hospital liaison note   Received request from [facility] for family interest in hospice inpatient unit. Talked with family by phone today to discuss services and hospice philosophy of care.   Chart reviewed/patient visited and at this time patient does not meet criteria for hospice inpatient unit.    She is appropriate for hospice services in the home or LTC facility and we will follow closely to observe for expected decline as brother indicates he plans to request cessation of tube feedings.   Patient's brother and TOC are aware.    Thank you for the opportunity to participate in this patient's care.    Elouise Husband, BSN, RN, [facility] Hospice hospital liaison [phone]

## 2023-09-29 NOTE — Hospital Course (Addendum)
 Patient with PMH of COPD, chronic respiratory failure on 2 LPM, stage IV non-small cell lung cancer S/P chemotherapy and radiation with brain metastasis presented with shortness of breath. Last saw Dr. Federico 10/23. Second opinion at [facility] on 8/23 due to side effects from chemotherapy.  Original recommendation was discontinuation of the systemic chemotherapy with close monitoring every 3 to 4 months. Has been seeing Dr. Buckley regularly in 2024.   6/15 presented with shortness of breath admitted for aspiration pneumonia with right middle and lower lobe consolidation secondary to dysphagia from bilateral vocal cord paralysis 6/16 MRI brain with without contrast shows new cerebellopontine angle metastatic lesion with compression of the surrounding area.  New onset A-fib with RVR. 6/17 found lethargic.  ABG pH 7.3/PCO2 81 pO2/229.  Transferred to ICU.  Did not require intubation.  Treated with BiPAP. 6/18 Cortrak inserted for nutrition.  Transferred out of the ICU. 6/20 ENT consulted.  Tracheostomy offered if desired.  MBS shows profound and complex dysphagia due to right hypoglossal and facial nerve impairments.  Seen to have Nonproductive cough. 6/21 rapid response for bradycardia and hypotension.  Beta-blocker stopped. 6/22 radiation oncology was consulted. 6/23 psychiatry was consulted.  Patient lacks capacity for complex medical treatment decision due to delirium, hypoglossal dysfunction as well as possible paraneoplastic processes.  HCPOA documentation was completed with brothers. 6/24 Dr. Izell estimate about a 50-50 chance of some improvement in cranial nerve symptoms but it would not make them go away completely.  Recommended that her prognosis is guarded regardless of intervention. After discussion with palliative care family decided to transition to comfort care. 6/26 per [facility] [place] currently does not meet criteria for inpatient residential hospice.  Difficult  situation, does not have good home support for home hospice and currently not a candidate for inpatient hospice per hospice facility.  Assessment and Plan: Acute hypoxic and hypercapnic and hypoxic respiratory failure.   Severe sepsis secondary to pneumonia. Due to aspiration pneumonia from bilateral vocal cord paralysis, also of a brain mass.  Required ICU admission for closer monitoring but did not require intubation. Oxygenation improving. Treated with antibiotic. Now comfort care.  Bilateral vocal cord paralysis. Dysphagia. Secondary to metastatic lung cancer. Dysphonia. Sees Dr. Ricci, ENT at [facility], Original plan was outpatient elective tracheostomy. Per Dr. Izell radiation may improve chance of patient able to tolerate oral diet although would not eliminate dysphagia completely.  And also a chance of success is 50-50. Now comfort care.    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  Associated extension into right internal auditory canal and right hypoglossal canal. Brain mass, facial droop.   MRI shows new cerebellopontine angle mass. Likely responsible for the paralysis. Oncology was consulted. Radiation was offered as a potential option although would not add a quality time to her prognosis. Psychiatry was consulted.  Does not appear to have any capacity for medical decision making.  Family decided to transition to comfort.   Paroxysmal A-fib RVR. Bradycardia with hypotension. New diagnosis. Was treated with beta-blocker, later on this was stopped due to bradycardia. Not anticoagulation candidate due to brain mass  Demand ischemia  from RVR and hypoxia,  EKG nonischemic,  not anticoagulation candidate due to brain mass.  No chest pain. Supportive care.   COPD with exacerbation secondary pneumonia Treated with steroids and antibiotic and supplemental  oxygen . Continue supportive care and inhaler.  Capacity evaluation. Psychiatry was consulted on  6/23. On that evaluation that day patient was found not to have capacity to understand medical decision making.  Still able to assent to simple treatment options.  Severe protein-calorie malnutrition Body mass index is 19.81 kg/m.  Placing the patient at high risk for poor outcome.  Right facial paralysis. Secondary to CNS tumor metastasis. Monitor for now.  Diarrhea. Mostly in the setting of tube feeding although continues to have diarrhea despite stopping the tube feeding and being on scheduled Imodium  and scheduled Robinul . Switching Imodium  to Lomotil.  Excessive secretion. On scheduled Robinul . Likely secondary to pooling from facial nerve paralysis. Monitor
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
## 2023-09-30 DIAGNOSIS — R627 Adult failure to thrive: Secondary | ICD-10-CM | POA: Diagnosis not present

## 2023-09-30 DIAGNOSIS — C3490 Malignant neoplasm of unspecified part of unspecified bronchus or lung: Secondary | ICD-10-CM | POA: Diagnosis not present

## 2023-09-30 DIAGNOSIS — J189 Pneumonia, unspecified organism: Secondary | ICD-10-CM | POA: Diagnosis not present

## 2023-09-30 DIAGNOSIS — R1310 Dysphagia, unspecified: Secondary | ICD-10-CM | POA: Diagnosis not present

## 2023-09-30 DIAGNOSIS — J690 Pneumonitis due to inhalation of food and vomit: Secondary | ICD-10-CM | POA: Diagnosis not present

## 2023-09-30 LAB — GLUCOSE, CAPILLARY: Glucose-Capillary: 167 mg/dL — ABNORMAL HIGH (ref 70–99)

## 2023-09-30 NOTE — Progress Notes (Signed)
 6/26 Patient under Comfort Measures, IMM Letter respectfully not given.

## 2023-09-30 NOTE — Progress Notes (Signed)
 Nutrition Brief Note  Chart reviewed. Pt now transitioning to comfort care.  No further nutrition interventions planned at this time.  Please re-consult as needed.   Elliot Dally, RD Registered Dietitian  See Amion for more information

## 2023-09-30 NOTE — Progress Notes (Signed)
 [facility] 6N10 [facility]  Hospice hospital liaison note:  Met with patient and brother in room and talked with older brother by phone. Patient is awake and alert and has eaten some breakfast and is sipping on juice at this time. She was able to take PO Tylenol  and currently has no complaints.   Continued to discuss hospice vs inpatient unit appropriateness with family. At this time patient does not meet criteria for the IPU.   We will continue to follow and re-assess for any changes or deterioration in status.   Thank you for the opportunity to participate in this patient's care  Hunter Seip, BSN, RN Hospice hospital liaison [phone]

## 2023-09-30 NOTE — Plan of Care (Signed)

## 2023-09-30 NOTE — Plan of Care (Signed)
  Problem: Clinical Measurements: Goal: Respiratory complications will improve Outcome: Progressing Goal: Cardiovascular complication will be avoided Outcome: Progressing   Problem: Nutrition: Goal: Adequate nutrition will be maintained Outcome: Progressing   Problem: Elimination: Goal: Will not experience complications related to urinary retention Outcome: Progressing   

## 2023-09-30 NOTE — Progress Notes (Signed)
 [facility] Progress Note Patient: Elizabeth Mayer FMW:[id number] DOB: 11/30/1960 DOA: 09/19/2023  DOS: the patient was seen and examined on 09/30/2023  Brief Hospital Course: PMH of COPD, chronic respiratory failure, non-small cell lung cancer SP chemotherapy and radiation with brain metastasis presented with dysphagia.  Found to have heterogeneously enhancing mass at the right CP angle cistern. Psychiatry was consulted.  Patient does not have capacity to make medical decisions. Palliative care consulted. Currently comfort care.  Difficult situation as does not have good home support for home hospice and currently not a candidate for inpatient hospice per hospice facility.  Assessment and Plan: Acute hypoxic and hypercapnic and hypoxic respiratory failure.   Severe sepsis secondary to pneumonia. Due to aspiration pneumonia from bilateral vocal cord paralysis, also of a brain mass.  Required ICU admission for closer monitoring but did not require intubation. Oxygenation improving. Treated with antibiotic. Now comfort care.  Bilateral vocal cord paralysis. Dysphagia. Secondary to metastatic lung cancer. Dysphonia. Saw Dr. Katheryn Fort, ENT at [facility], Original plan was outpatient elective tracheostomy. Per Dr. Izell radiation may improve chance of patient able to tolerate oral diet although would not eliminate dysphagia completely.  And also a chance of success is 50-50. Now comfort care.    Metastatic stage IV lung cancer non-small cell,  Right cerebellopontine angle mass with brain compression on brainstem and right cerebellar hemisphere.  Associated extension into right internal auditory canal and right hypoglossal canal. Brain mass, facial droop.   MRI shows new cerebellopontine angle mass. Likely responsible for the paralysis. Oncology was consulted. Radiation was offered as a potential option although would not add a quality time to her prognosis. Psychiatry was  consulted.  Does not appear to have any capacity for medical decision making.  Family decided to transition to comfort.   Paroxysmal A-fib RVR.   New diagnosis.  Beta-blocker, not anticoagulation candidate due to brain mass diagnosed on MRI.  Mild demand ischemia from RVR and hypoxia, EKG nonischemic, not anticoagulation candidate due to brain mass.  Supportive care.   COPD.  Supportive care, on steroids, antibiotics and supplemental oxygen , now intubation.   Troponin rise.   Likely demand ischemia from A-fib RVR,   Capacity evaluation. Psychiatry was consulted. Patient does not have capacity to understand medical decision making.  Severe protein-calorie malnutrition Body mass index is 19.81 kg/m.  Nutrition Problem: Severe Malnutrition Etiology: cancer and cancer related treatments, chronic illness Nutrition Interventions: Interventions: Refer to RD note for recommendations   Subjective: No nausea no vomiting no fever no chills.  Wants to be more independent today.  Wants to be able to work with therapy.  Physical Exam: General: in Mild distress, No Rash, cachectic Cardiovascular: S1 and S2 Present, No Murmur Respiratory: Good respiratory effort, Bilateral Air entry present. No Crackles, No wheezes Abdomen: Bowel Sound present, No tenderness Extremities: No edema Neuro: Alert and oriented x3, no new focal deficit, right facial nerve paralysis  Data Reviewed: I have Reviewed nursing notes, Vitals, and Lab results. I have discussed pt's care plan and test results with palliative care  .   Disposition: Status is: Inpatient Remains inpatient appropriate because: Needing further clarity on goals of care  Family Communication: No one at bedside Level of care: Palliative Care   Vitals:   09/29/23 1541 09/30/23 0221 09/30/23 0854 09/30/23 1652  BP: (!) 103/57 113/62 (!) 106/57 (!) 99/53  Pulse:  81 73 97  Resp: 15  16 16   Temp: 98.2 F (36.8 C) 97.7  F (36.5 C) (!) 97.4 F  (36.3 C) 97.7 F (36.5 C)  TempSrc: Oral Oral Oral Oral  SpO2:  100% 100% 96%  Weight:      Height:         Author: Yetta Blanch, MD 09/30/2023 6:08 PM  Please look on [email or web address] to find out who is on call.

## 2023-09-30 NOTE — TOC Progression Note (Signed)
 Transition of Care [facility]) - Progression Note    Patient Details  Name: MARIALENA WOLLEN MRN: [id number] Date of Birth: [birth year]  Transition of Care [facility]) CM/SW Contact  Inocente GORMAN Kindle, LCSW Phone Number: 09/30/2023, 4:11 PM  Clinical Narrative:    CSW continuing to follow with [facility].    Expected Discharge Plan: Hospice Medical Facility Barriers to Discharge: Hospice Bed not available  Expected Discharge Plan and Services In-house Referral: Clinical Social Work Discharge Planning Services: CM Consult Post Acute Care Choice: Hospice Living arrangements for the past 2 months: Single Family Home                             [facility] Agency: [facility] Date [facility] Agency Contacted: 09/28/23 Time HH Agency Contacted: 1509 Representative spoke with at [facility] Agency: Melissa   Social Determinants of Health (SDOH) Interventions SDOH Screenings   Food Insecurity: No Food Insecurity (09/22/2023)  Housing: Low Risk  (09/22/2023)  Transportation Needs: No Transportation Needs (09/22/2023)  Utilities: Not At Risk (09/22/2023)  Alcohol Screen: Low Risk  (02/15/2023)  Depression (PHQ2-9): Low Risk  (02/15/2023)  Financial Resource Strain: Low Risk  (02/15/2023)  Physical Activity: Sufficiently Active (02/15/2023)  Social Connections: Moderately Isolated (02/15/2023)  Stress: No Stress Concern Present (02/15/2023)  Tobacco Use: Medium Risk (09/19/2023)  Health Literacy: Adequate Health Literacy (02/15/2023)    Readmission Risk Interventions     No data to display

## 2023-09-30 NOTE — Progress Notes (Signed)
 Patient transferred with her belongings to  6N10 report called to receiving RN.

## 2023-09-30 NOTE — Progress Notes (Signed)
Pt arrived to the floor.
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
## 2023-09-30 NOTE — Progress Notes (Signed)
 Pt was very confused as to what comfort/palliative care meant. Passed this information along to [place], LPN.

## 2023-09-30 NOTE — Care Management Important Message (Signed)
 Important Message  Patient Details  Name: Elizabeth Mayer MRN: [id number] Date of Birth: 10-05-1960   Important Message Given:  No     Jon Cruel 09/30/2023, 2:07 PM

## 2023-10-01 DIAGNOSIS — R627 Adult failure to thrive: Secondary | ICD-10-CM | POA: Diagnosis not present

## 2023-10-01 DIAGNOSIS — J189 Pneumonia, unspecified organism: Secondary | ICD-10-CM | POA: Diagnosis not present

## 2023-10-01 DIAGNOSIS — C3490 Malignant neoplasm of unspecified part of unspecified bronchus or lung: Secondary | ICD-10-CM | POA: Diagnosis not present

## 2023-10-01 DIAGNOSIS — R1310 Dysphagia, unspecified: Secondary | ICD-10-CM | POA: Diagnosis not present

## 2023-10-01 DIAGNOSIS — R0602 Shortness of breath: Secondary | ICD-10-CM | POA: Diagnosis not present

## 2023-10-01 DIAGNOSIS — Z515 Encounter for palliative care: Secondary | ICD-10-CM | POA: Diagnosis not present

## 2023-10-01 DIAGNOSIS — J690 Pneumonitis due to inhalation of food and vomit: Secondary | ICD-10-CM | POA: Diagnosis not present

## 2023-10-01 MED ORDER — DIPHENOXYLATE-ATROPINE 2.5-0.025 MG PO TABS
1.0000 | ORAL_TABLET | Freq: Four times a day (QID) | ORAL | Status: DC
  Administered 2023-10-01 – 2023-10-02 (×3): 1 via ORAL
  Filled 2023-10-01 (×3): qty 1

## 2023-10-01 MED ORDER — LOPERAMIDE HCL 1 MG/7.5ML PO SUSP: 2.0000 mg | ORAL | Status: DC | PRN

## 2023-10-01 NOTE — Plan of Care (Signed)
  Problem: Education: Goal: Knowledge of General Education information will improve Description: Including pain rating scale, medication(s)/side effects and non-pharmacologic comfort measures Outcome: Progressing   Problem: Coping: Goal: Level of anxiety will decrease Outcome: Progressing   Problem: Elimination: Goal: Will not experience complications related to bowel motility Outcome: Progressing   Problem: Safety: Goal: Ability to remain free from injury will improve Outcome: Progressing   Problem: Skin Integrity: Goal: Risk for impaired skin integrity will decrease Outcome: Progressing   

## 2023-10-01 NOTE — Plan of Care (Signed)
  Problem: Clinical Measurements: Goal: Respiratory complications will improve Outcome: Not Progressing Goal: Cardiovascular complication will be avoided Outcome: Progressing   Problem: Activity: Goal: Risk for activity intolerance will decrease Outcome: Progressing   Problem: Nutrition: Goal: Adequate nutrition will be maintained Outcome: Progressing   Problem: Elimination: Goal: Will not experience complications related to urinary retention Outcome: Progressing   Problem: Safety: Goal: Ability to remain free from injury will improve Outcome: Progressing

## 2023-10-01 NOTE — Progress Notes (Signed)
 Administered zofran  per primary nurse, Luane Malling, LPN request d/t nausea and vomiting.

## 2023-10-01 NOTE — Progress Notes (Signed)
 [facility] Progress Note Patient: Elizabeth Mayer FMW:[id number] DOB: [birth year] DOA: 09/19/2023  DOS: the patient was seen and examined on 10/01/2023  Brief Hospital Course: Patient with PMH of COPD, chronic respiratory failure on 2 LPM, stage IV non-small cell lung cancer S/P chemotherapy and radiation with brain metastasis presented with shortness of breath. Last saw Dr. Federico 10/23. Second opinion at [facility] on 8/23 due to side effects from chemotherapy.  Original recommendation was discontinuation of the systemic chemotherapy with close monitoring every 3 to 4 months. Has been seeing Dr. Buckley regularly in 2024.   6/15 presented with shortness of breath admitted for aspiration pneumonia with right middle and lower lobe consolidation secondary to dysphagia from bilateral vocal cord paralysis 6/16 MRI brain with without contrast shows new cerebellopontine angle metastatic lesion with compression of the surrounding area.  New onset A-fib with RVR. 6/17 found lethargic.  ABG pH 7.3/PCO2 81 pO2/229.  Transferred to ICU.  Did not require intubation.  Treated with BiPAP. 6/18 Cortrak inserted for nutrition.  Transferred out of the ICU. 6/20 ENT consulted.  Tracheostomy offered if desired.  MBS shows profound and complex dysphagia due to right hypoglossal and facial nerve impairments.  Seen to have Nonproductive cough. 6/21 rapid response for bradycardia and hypotension.  Beta-blocker stopped. 6/22 radiation oncology was consulted. 6/23 psychiatry was consulted.  Patient lacks capacity for complex medical treatment decision due to delirium, hypoglossal dysfunction as well as possible paraneoplastic processes.  HCPOA documentation was completed with brothers. 6/24 Dr. Izell estimate about a 50-50 chance of some improvement in cranial nerve symptoms but it would not make them go away completely.  Recommended that her prognosis is guarded regardless of intervention. After discussion with  palliative care family decided to transition to comfort care. 6/26 per [facility] [place] currently does not meet criteria for inpatient residential hospice.  Difficult situation, does not have good home support for home hospice and currently not a candidate for inpatient hospice per hospice facility.  Assessment and Plan: Acute hypoxic and hypercapnic and hypoxic respiratory failure.   Severe sepsis secondary to pneumonia. Due to aspiration pneumonia from bilateral vocal cord paralysis, also of a brain mass.  Required ICU admission for closer monitoring but did not require intubation. Oxygenation improving. Treated with antibiotic. Now comfort care.  Bilateral vocal cord paralysis. Dysphagia. Secondary to metastatic lung cancer. Dysphonia. Sees Dr. Ricci, ENT at [facility], Original plan was outpatient elective tracheostomy. Per Dr. Izell radiation may improve chance of patient able to tolerate oral diet although would not eliminate dysphagia completely.  And also a chance of success is 50-50. Now comfort care.    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  Associated extension into right internal auditory canal and right hypoglossal canal. Brain mass, facial droop.   MRI shows new cerebellopontine angle mass. Likely responsible for the paralysis. Oncology was consulted. Radiation was offered as a potential option although would not add a quality time to her prognosis. Psychiatry was consulted.  Does not appear to have any capacity for medical decision making.  Family decided to transition to comfort.   Paroxysmal A-fib RVR. Bradycardia with hypotension. New diagnosis. Was treated with beta-blocker, later on this was stopped due to bradycardia. Not anticoagulation candidate due to brain mass  Demand ischemia  from RVR and hypoxia,  EKG nonischemic,   not anticoagulation candidate due to brain mass.  No chest pain. Supportive care.  COPD with exacerbation secondary pneumonia Treated with steroids and antibiotic and supplemental oxygen . Continue supportive care and inhaler.  Capacity evaluation. Psychiatry was consulted on 6/23. On that evaluation that day patient was found not to have capacity to understand medical decision making.  Still able to assent to simple treatment options.  Severe protein-calorie malnutrition Body mass index is 19.81 kg/m.  Placing the patient at high risk for poor outcome.  Right facial paralysis. Secondary to CNS tumor metastasis. Monitor for now.  Diarrhea. Mostly in the setting of tube feeding although continues to have diarrhea despite stopping the tube feeding and being on scheduled Imodium  and scheduled Robinul . Switching Imodium  to Lomotil.  Excessive secretion. On scheduled Robinul . Likely secondary to pooling from facial nerve paralysis. Monitor   Subjective: Had an episode of choking while drinking soda.  No nausea no vomiting no fever no chills.  Denies any chest pain.  Asking about criteria for potential discharge.  Ask about recommendation for Dr. Izell.  Physical Exam: Right facial droop. Upper airway wheezing. No edema. No bowel tenderness. Alert awake and oriented x 3.  Data Reviewed: I have Reviewed nursing notes, Vitals, and Lab results.  Disposition: Status is: Inpatient Remains inpatient appropriate because: Currently comfort care.  No support outside the hospital.   Family Communication: No one at bedside Level of care: Palliative Care   Vitals:   09/30/23 0221 09/30/23 0854 09/30/23 1652 10/01/23 0512  BP: 113/62 (!) 106/57 (!) 99/53 108/60  Pulse: 81 73 97 77  Resp:  16 16 17   Temp: 97.7 F (36.5 C) (!) 97.4 F (36.3 C) 97.7 F (36.5 C) 97.7 F (36.5 C)  TempSrc: Oral Oral Oral Oral  SpO2: 100% 100% 96% 100%  Weight:      Height:          Author: Yetta Blanch, MD 10/01/2023 4:10 PM  Please look on [email or web address] to find out who is on call.
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
## 2023-10-01 NOTE — Progress Notes (Signed)
 [facility]  Hospice hospital liaison note:  Patient previously referred to [facility] however does not currently meet criteria. Met in room with patient who is alert and talkative without complaint.   Liaison will continue to follow and reassess as appropriate.   Thank you Hunter Seip, BSN, RN [phone]

## 2023-10-02 DIAGNOSIS — R0602 Shortness of breath: Secondary | ICD-10-CM | POA: Diagnosis not present

## 2023-10-02 DIAGNOSIS — C3490 Malignant neoplasm of unspecified part of unspecified bronchus or lung: Secondary | ICD-10-CM | POA: Diagnosis not present

## 2023-10-02 DIAGNOSIS — Z515 Encounter for palliative care: Secondary | ICD-10-CM | POA: Diagnosis not present

## 2023-10-02 MED ORDER — DIPHENOXYLATE-ATROPINE 2.5-0.025 MG PO TABS
1.0000 | ORAL_TABLET | Freq: Four times a day (QID) | ORAL | Status: DC | PRN
  Administered 2023-10-03: 1 via ORAL
  Filled 2023-10-02: qty 1

## 2023-10-02 MED ORDER — LOPERAMIDE HCL 2 MG PO CAPS
2.0000 mg | ORAL_CAPSULE | Freq: Four times a day (QID) | ORAL | Status: DC
  Administered 2023-10-02 – 2023-10-04 (×12): 2 mg via ORAL
  Filled 2023-10-02 (×13): qty 1

## 2023-10-02 MED ORDER — ENSURE PLUS HIGH PROTEIN PO LIQD
237.0000 mL | Freq: Two times a day (BID) | ORAL | Status: DC
  Administered 2023-10-02 – 2023-10-04 (×5): 237 mL via ORAL

## 2023-10-02 NOTE — Progress Notes (Signed)
 [facility] Progress Note Patient: Elizabeth Mayer FMW:[id number] DOB: Feb 27, 1961 DOA: 09/19/2023  DOS: the patient was seen and examined on 10/02/2023  Brief Hospital Course: Patient with PMH of COPD, chronic respiratory failure on 2 LPM, stage IV non-small cell lung cancer S/P chemotherapy and radiation with brain metastasis presented with shortness of breath. Last immunotherapy-Keytruda  6/23 Second opinion at [facility] on 8/23 due to side effects from  Original recommendation was discontinuation of the systemic chemotherapy with close monitoring every 3 to 4 months. Last saw Dr. Federico 10/23. Has been seeing Dr. Buckley regularly in 2024.   6/15 presented with shortness of breath admitted for aspiration pneumonia with right middle and lower lobe consolidation secondary to dysphagia from bilateral vocal cord paralysis 6/16 MRI brain with without contrast shows new cerebellopontine angle metastatic lesion with compression of the surrounding area.  New onset A-fib with RVR. 6/17 found lethargic.  ABG pH 7.3/PCO2 81 pO2/229.  Transferred to ICU.  Did not require intubation.  Treated with BiPAP. 6/18 Cortrak inserted for nutrition.  Transferred out of the ICU. 6/20 ENT consulted.  Tracheostomy offered if desired.  MBS shows profound and complex dysphagia due to right hypoglossal and facial nerve impairments.  Seen to have Nonproductive cough. 6/21 rapid response for bradycardia and hypotension.  Beta-blocker stopped. 6/22 radiation oncology was consulted. 6/23 psychiatry was consulted.  Patient lacks capacity for complex medical treatment decision due to delirium, hypoglossal dysfunction as well as possible paraneoplastic processes.  HCPOA documentation was completed with brothers. 6/24 Dr. Izell estimate about a 50-50 chance of some improvement in cranial nerve symptoms but it would not make them go away completely.  Recommended that her prognosis is guarded regardless of intervention. After  discussion with palliative care family decided to transition to comfort care. 6/26 per [facility] [place] currently does not meet criteria for inpatient residential hospice.  Difficult situation, does not have good home support for home hospice and currently not a candidate for inpatient hospice per hospice facility.  Assessment and Plan: Acute hypoxic and hypercapnic and hypoxic respiratory failure.   Severe sepsis secondary to pneumonia. Due to aspiration pneumonia from bilateral vocal cord paralysis, also of a brain mass.  Required ICU admission for closer monitoring but did not require intubation. Oxygenation improving. Treated with antibiotic. Now comfort care.  Bilateral vocal cord paralysis. Dysphagia. Secondary to metastatic lung cancer. Dysphonia. Sees Dr. Ricci, ENT at [facility], Original plan was outpatient elective tracheostomy. Per Dr. Izell radiation may improve chance of patient able to tolerate oral diet although would not eliminate dysphagia completely.  And also a chance of success is 50-50. Now comfort care.    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  Associated extension into right internal auditory canal and right hypoglossal canal. Brain mass, facial droop.   MRI shows new cerebellopontine angle mass. Likely responsible for the paralysis. Oncology was consulted. Radiation was offered as a potential option although would not add a quality time to her prognosis. Psychiatry was consulted.  Does not appear to have any capacity for medical decision making.  Family decided to transition to comfort.   Paroxysmal A-fib RVR. Bradycardia with hypotension. New diagnosis. Was treated with beta-blocker, later on this was stopped due to bradycardia. Not anticoagulation candidate due to brain mass  Demand ischemia  from RVR and hypoxia,  EKG  nonischemic.  Troponin [phone]. Not a candidate for anticoagulation due to brain mass.  No chest pain.  Continue supportive care.   COPD with exacerbation secondary pneumonia Treated with steroids and antibiotic and supplemental oxygen . Continue supportive care and inhaler.  Capacity evaluation. Psychiatry was consulted on 6/23. On that evaluation that day patient was found not to have capacity to understand medical decision making.  Still able to assent to treatment options.  Severe protein-calorie malnutrition Body mass index is 19.81 kg/m.  Placing the patient at high risk for poor outcome.  Right facial paralysis. Secondary to CNS tumor metastasis. Monitor for now.  Diarrhea. Mostly in the setting of tube feeding although continues to have diarrhea despite stopping the tube feeding and being on scheduled Imodium  and scheduled Robinul . Briefly switched to scheduled Lomotil but due to nausea and vomiting now on scheduled Imodium  again with as needed Lomotil.  Excessive secretion. On scheduled Robinul . Likely secondary to pooling from facial nerve paralysis. Monitor   Subjective: Denied any acute complaint at the time of my evaluation.  No nausea or vomiting.  Had episode of nausea and vomiting last night.  Was asking for rescue inhaler.  Physical Exam: Upper airway wheezing unchanged. Voice remains muffled. No edema. Bowel sound present Was eating breakfast the time of my evaluation.  Data Reviewed: I have Reviewed nursing notes, Vitals, and Lab results. Disposition: Status is: Inpatient Remains inpatient appropriate because: Continue comfort care.   Family Communication: No one at bedside. Level of care: Palliative Care   Vitals:   09/30/23 0854 09/30/23 1652 10/01/23 0512 10/02/23 0644  BP: (!) 106/57 (!) 99/53 108/60 (!) 94/52  Pulse: 73 97 77 79  Resp: 16 16 17 17   Temp: (!) 97.4 F (36.3 C) 97.7 F (36.5 C) 97.7 F (36.5 C)   TempSrc: Oral Oral Oral    SpO2: 100% 96% 100% 96%  Weight:      Height:         Author: Yetta Blanch, MD 10/02/2023 5:13 PM  Please look on [email or web address] to find out who is on call.

## 2023-10-02 NOTE — Progress Notes (Signed)
 [facility]  Hospice hospital liaison note:   Patient previously referred to [facility], liaison continues to follow and assess for appropriateness. Unfortunately [facility] does not have any available beds today.    Thank you Hunter Seip, BSN, RN [phone]

## 2023-10-02 NOTE — Plan of Care (Signed)
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
## 2023-10-03 DIAGNOSIS — C3490 Malignant neoplasm of unspecified part of unspecified bronchus or lung: Secondary | ICD-10-CM | POA: Diagnosis not present

## 2023-10-03 DIAGNOSIS — Z515 Encounter for palliative care: Secondary | ICD-10-CM | POA: Diagnosis not present

## 2023-10-03 DIAGNOSIS — R0602 Shortness of breath: Secondary | ICD-10-CM | POA: Diagnosis not present

## 2023-10-03 MED ORDER — SCOPOLAMINE 1 MG/3DAYS TD PT72
1.0000 | MEDICATED_PATCH | TRANSDERMAL | Status: DC
  Administered 2023-10-03 – 2023-10-06 (×2): 1.5 mg via TRANSDERMAL
  Filled 2023-10-03 (×2): qty 1

## 2023-10-03 NOTE — Progress Notes (Signed)
 [facility] Progress Note Patient: Elizabeth Mayer FMW:[id number] DOB: May 15, 1960 DOA: 09/19/2023  DOS: the patient was seen and examined on 10/03/2023  Brief Hospital Course: Patient with PMH of COPD, chronic respiratory failure on 2 LPM, stage IV non-small cell lung cancer S/P chemotherapy and radiation with brain metastasis presented with shortness of breath. Last immunotherapy-Keytruda  6/23 Second opinion at [facility] on 8/23 due to side effects from  Original recommendation was discontinuation of the systemic chemotherapy with close monitoring every 3 to 4 months. Last saw Dr. Federico 10/23. Has been seeing Dr. Buckley regularly in 2024.   6/15 presented with shortness of breath admitted for aspiration pneumonia with right middle and lower lobe consolidation secondary to dysphagia from bilateral vocal cord paralysis 6/16 MRI brain with without contrast shows new cerebellopontine angle metastatic lesion with compression of the surrounding area.  New onset A-fib with RVR. 6/17 found lethargic.  ABG pH 7.3/PCO2 81 pO2/229.  Transferred to ICU.  Did not require intubation.  Treated with BiPAP. 6/18 Cortrak inserted for nutrition.  Transferred out of the ICU. 6/20 ENT consulted.  Tracheostomy offered if desired.  MBS shows profound and complex dysphagia due to right hypoglossal and facial nerve impairments.  Seen to have Nonproductive cough. 6/21 rapid response for bradycardia and hypotension.  Beta-blocker stopped. 6/22 radiation oncology was consulted. 6/23 psychiatry was consulted.  Patient lacks capacity for complex medical treatment decision due to delirium, hypoglossal dysfunction as well as possible paraneoplastic processes.  HCPOA documentation was completed with brothers. 6/24 Dr. Izell estimate about a 50-50 chance of some improvement in cranial nerve symptoms but it would not make them go away completely.  Recommended that her prognosis is guarded regardless of intervention. After  discussion with palliative care family decided to transition to comfort care. 6/26 per hospice of [facility] currently does not meet criteria for inpatient residential hospice.  Difficult situation, does not have good home support for home hospice and currently not a candidate for inpatient hospice per hospice facility.  Assessment and Plan: Acute hypoxic and hypercapnic and hypoxic respiratory failure.   Severe sepsis secondary to pneumonia. Due to aspiration pneumonia from bilateral vocal cord paralysis, also of a brain mass.  Required ICU admission for closer monitoring but did not require intubation. Oxygenation improved to baseline. Treated with antibiotic. Now comfort care.  Bilateral vocal cord paralysis. Dysphagia. Secondary to metastatic lung cancer. Dysphonia. Sees Dr. Ricci, ENT at [facility], Original plan was outpatient elective tracheostomy. Per Dr. Izell radiation may improve chance of patient able to tolerate oral diet although would not eliminate dysphagia completely.  And also a chance of success is 50-50. Now comfort care.  Appears to be increasing pooling of upper airway secretion.  Add scopolamine patch to Robinul .   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  Associated extension into right internal auditory canal and right hypoglossal canal. Brain mass, facial droop.   MRI shows new cerebellopontine angle mass. Likely responsible for the paralysis. Oncology was consulted. Radiation was offered as a potential option although would not add a quality time to her prognosis. Psychiatry was consulted.  Does not appear to have any capacity for medical decision making. Family decided to transition to comfort.   Paroxysmal A-fib RVR. Bradycardia with hypotension. New diagnosis. Was treated with beta-blocker, later on this was stopped due to  bradycardia. Not anticoagulation candidate due to brain mass  Demand ischemia  from RVR and  hypoxia,  EKG nonischemic.  Troponin [phone]. Not a candidate for anticoagulation due to brain mass.  No chest pain.  Continue supportive care.   COPD with exacerbation secondary pneumonia Treated with steroids and antibiotic and supplemental oxygen . Continue supportive care and inhaler. Upper airway crackles heard.  Capacity evaluation. Psychiatry was consulted on 6/23. On that evaluation that day patient was found not to have capacity to understand medical decision making.  Still able to assent to treatment options.  Severe protein-calorie malnutrition Body mass index is 19.81 kg/m.  Placing the patient at high risk for poor outcome.  Right facial paralysis. Secondary to CNS tumor metastasis. Monitor for now.  Diarrhea. Mostly in the setting of tube feeding although continues to have diarrhea despite stopping the tube feeding and being on scheduled Imodium  and scheduled Robinul . Briefly switched to scheduled Lomotil but due to nausea and vomiting now on scheduled Imodium  again with as needed Lomotil. Rectal tube output improving but still liquid.  Excessive secretion. On scheduled Robinul . Likely secondary to pooling from facial nerve paralysis.  Add scopolamine patch. Monitor   Subjective: No nausea no vomiting no fever no chills.  Breathing heavily.  Voice more muffled today and has gurgling with every word.   Physical Exam: Upper airway crackles. S1-S2 present from Bowel sound present for No edema. Unchanged right facial droop.  Data Reviewed: I have Reviewed nursing notes, Vitals, and Lab results.  Disposition: Status is: Inpatient Remains inpatient appropriate because: Continue comfort care.  Family Communication: No one at bedside Level of care: Palliative Care   Vitals:   10/02/23 0644 10/02/23 2011 10/03/23 0500 10/03/23 0845  BP: (!) 94/52  (!) 93/46  119/64  Pulse: 79 69 87 (!) 108  Resp: 17 16  16   Temp:   98.6 F (37 C) 97.8 F (36.6 C)  TempSrc:      SpO2: 96% 95% 97% 98%  Weight:      Height:         Author: Yetta Blanch, MD 10/03/2023 11:31 AM  Please look on [email or web address] to find out who is on call.

## 2023-10-03 NOTE — Progress Notes (Signed)
 [facility]  Hospice hospital liaison note:   Patient previously referred to [facility], liaison continues to follow and assess for appropriateness. Unfortunately [facility] does not have any available beds today.    Thank you Hunter Seip, BSN, RN [phone]

## 2023-10-03 NOTE — Plan of Care (Signed)
 Pt resting comfortably after morphine .  Family was at [facility].     Problem: Education: Goal: Knowledge of General Education information will improve Description: Including pain rating scale, medication(s)/side effects and non-pharmacologic comfort measures Outcome: Progressing   Problem: Health Behavior/Discharge Planning: Goal: Ability to manage health-related needs will improve Outcome: Progressing   Problem: Clinical Measurements: Goal: Ability to maintain clinical measurements within normal limits will improve Outcome: Progressing Goal: Will remain free from infection Outcome: Progressing Goal: Diagnostic test results will improve Outcome: Progressing Goal: Respiratory complications will improve Outcome: Progressing Goal: Cardiovascular complication will be avoided Outcome: Progressing

## 2023-10-03 NOTE — Plan of Care (Signed)
  Problem: Education: Goal: Knowledge of General Education information will improve Description: Including pain rating scale, medication(s)/side effects and non-pharmacologic comfort measures Outcome: Progressing   Problem: Activity: Goal: Risk for activity intolerance will decrease Outcome: Progressing   Problem: Nutrition: Goal: Adequate nutrition will be maintained Outcome: Progressing   Problem: Elimination: Goal: Will not experience complications related to bowel motility Outcome: Progressing   Problem: Pain Managment: Goal: General experience of comfort will improve and/or be controlled Outcome: Progressing   Problem: Safety: Goal: Ability to remain free from injury will improve Outcome: Progressing   Problem: Skin Integrity: Goal: Risk for impaired skin integrity will decrease Outcome: Progressing

## 2023-10-04 ENCOUNTER — Encounter: Payer: Self-pay | Admitting: Hematology and Oncology

## 2023-10-04 ENCOUNTER — Encounter: Payer: Self-pay | Admitting: Physician Assistant

## 2023-10-04 DIAGNOSIS — I4891 Unspecified atrial fibrillation: Secondary | ICD-10-CM | POA: Diagnosis not present

## 2023-10-04 DIAGNOSIS — J189 Pneumonia, unspecified organism: Secondary | ICD-10-CM | POA: Diagnosis not present

## 2023-10-04 DIAGNOSIS — J9621 Acute and chronic respiratory failure with hypoxia: Secondary | ICD-10-CM | POA: Diagnosis not present

## 2023-10-04 DIAGNOSIS — C3490 Malignant neoplasm of unspecified part of unspecified bronchus or lung: Secondary | ICD-10-CM | POA: Diagnosis not present

## 2023-10-04 DIAGNOSIS — Z515 Encounter for palliative care: Secondary | ICD-10-CM | POA: Diagnosis not present

## 2023-10-04 DIAGNOSIS — R0602 Shortness of breath: Secondary | ICD-10-CM | POA: Diagnosis not present

## 2023-10-04 MED ORDER — CHOLESTYRAMINE LIGHT 4 G PO PACK
2.0000 g | PACK | Freq: Every day | ORAL | Status: DC
  Filled 2023-10-04: qty 1

## 2023-10-04 MED ORDER — GLYCOPYRROLATE 0.2 MG/ML IJ SOLN
0.4000 mg | Freq: Four times a day (QID) | INTRAMUSCULAR | Status: DC
  Administered 2023-10-04 – 2023-10-06 (×9): 0.4 mg via INTRAVENOUS
  Filled 2023-10-04 (×9): qty 2

## 2023-10-04 MED ORDER — ACETAMINOPHEN 500 MG PO TABS
500.0000 mg | ORAL_TABLET | Freq: Three times a day (TID) | ORAL | Status: DC
  Administered 2023-10-04: 500 mg via ORAL
  Filled 2023-10-04 (×3): qty 1

## 2023-10-04 NOTE — TOC Progression Note (Signed)
 Transition of Care [facility]) - Progression Note    Patient Details  Name: Elizabeth Mayer MRN: [id number] Date of Birth: Jan 19, 1961  Transition of [facility]) CM/SW Contact  Luann SHAUNNA [place], [place] Phone Number: 10/04/2023, 1:18 PM  Clinical Narrative:     Pt still not appropriate for inpatient hospice facility. CSW met with pt and updated her. Discussed alternative for LTC with hospice under pt's medcaid. She is agreeable to SNF w/u for LTC SNF w/ hospice. Fl2 completed and bed requests sent in hub.   Expected Discharge Plan: Skilled Nursing Facility Barriers to Discharge: SNF Pending bed offer  Expected Discharge Plan and Services In-house Referral: Clinical Social Work Discharge Planning Services: CM Consult Post Acute Care Choice: Hospice Living arrangements for the past 2 months: Single Family Home                             [facility] Agency: [facility] Date [facility] Agency Contacted: 09/28/23 Time HH Agency Contacted: [id number] Representative spoke with at [facility] Agency: Melissa   Social Determinants of Health (SDOH) Interventions SDOH Screenings   Food Insecurity: No Food Insecurity (09/22/2023)  Housing: Low Risk  (09/22/2023)  Transportation Needs: No Transportation Needs (09/22/2023)  Utilities: Not At Risk (09/22/2023)  Alcohol Screen: Low Risk  (02/15/2023)  Depression (PHQ2-9): Low Risk  (02/15/2023)  Financial Resource Strain: Low Risk  (02/15/2023)  Physical Activity: Sufficiently Active (02/15/2023)  Social Connections: Moderately Isolated (02/15/2023)  Stress: No Stress Concern Present (02/15/2023)  Tobacco Use: Medium Risk (09/19/2023)  Health Literacy: Adequate Health Literacy (02/15/2023)    Readmission Risk Interventions     No data to display

## 2023-10-04 NOTE — Plan of Care (Signed)
°  Problem: Education: Goal: Knowledge of General Education information will improve Description: Including pain rating scale, medication(s)/side effects and non-pharmacologic comfort measures Outcome: Progressing   Problem: Activity: Goal: Risk for activity intolerance will decrease Outcome: Progressing   Problem: Nutrition: Goal: Adequate nutrition will be maintained Outcome: Progressing   Problem: Elimination: Goal: Will not experience complications related to bowel motility Outcome: Progressing   Problem: Safety: Goal: Ability to remain free from injury will improve Outcome: Progressing   Problem: Skin Integrity: Goal: Risk for impaired skin integrity will decrease Outcome: Progressing   

## 2023-10-04 NOTE — Plan of Care (Signed)
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
## 2023-10-04 NOTE — Progress Notes (Signed)
 [facility] 6N10 [facility]  Hospice hospital liaison note:   Patient previously referred to [facility] however does not currently meet criteria.   Liaison will continue to follow and reassess as appropriate.    Thank you Eleanor Nail, LPN [phone]

## 2023-10-04 NOTE — Progress Notes (Addendum)
 Daily Progress Note   Patient Name: Elizabeth Mayer       Date: 10/04/2023 DOB: [birth year]  Age: 63 y.o. MRN#: [id number] Attending Physician: Tobie Yetta HERO, MD Primary Care Physician: Georgina Speaks, FNP Admit Date: 09/19/2023  Reason for Consultation/Follow-up: Establishing goals of care and Terminal Care  Subjective: Chart reviewed including progress notes, labs, and imaging. Per MAR, 1 dose of PRN IV morphine  was administered in the past 24 hours. Patient examined at bedside. No acute distress. Patient is ordering her meals with nutritional services. Reports 4/10 pain in her buttocks. No family present.   Patient tells me her buttock pain is 6/10 at its worst and 2/10 at its best. She is agreeable to a dose of PRN morphine . She also would like some ointment for her eye. She wants to be sure she gets her cough medicine and other regular medicines as well as teeth brushed every morning. I discussed with RN, who shared that this was done. Reviewed patient's medication list with her and ensured that she is satisfied with her current regimen. Offered to add an additional dose of Robinul  for her secretions per day and she is agreeable. No other concerns at this time.  Prognosis remains poor with weeks-days pending hospital trajectory in the setting of metastatic cancer with new right cerebellopontine angle mass, dysphagia, and high risk of aspiration. Will continue to focus on comfort and closely evaluate keeping patient's family informed of any changes.   Attempted to call patient's brother Remene but was unable to reach. LVM with PMT contact information.   Per RN, patient declined morphine  and eye ointment when brought to her as requested.   Physical Exam Vitals and nursing note reviewed.  Constitutional:      General: She is not in acute  distress.    Appearance: She is ill-appearing.  Pulmonary:     Effort: Pulmonary effort is normal.     Comments: Oxygen  1L via nasal cannula in place   Skin:    General: Skin is warm and dry.   Neurological:     Mental Status: She is alert.     Comments: Awake and alert. Right facial droop  Psychiatric:        Mood and Affect: Mood normal.        Behavior: Behavior normal.               Palliative Assessment/Data: PPS: 20%   Palliative Care Assessment & Plan   Patient Profile/HPI:   63 y.o. female  with past medical history of COPD, afib- not on anticoag, chronic resp failure on 2-3 L oxygen  at home, NSCLC- s/p chemotherapy and SRS to brain- she has not had treatment since August 2023 and lung and brain disease has been stable on surveillance- she elected to defer any followup with medical oncology Dr. Federico since 2023, and has not seen neuro-oncology Dr. Buckley since October 2024 - at which time there was mixed findings on brain MRI with two lesions slightly progressed and she expressed she did not want any further CNS directed treatments admitted on 09/19/2023 with shortness of breath. Workup reveals sepsis due to aspiration pneumonia. SABRA MRI of brain showed new progressing brain  mass.    MRI 09/20/2023       Severe dysphagia and nerve paralysis-- significant for heterogeneously enhancing mass along the right cerebellopontine angle cistern, measuring  36 x 18 mm with features suspicious for metastasis and associated extension into the right internal auditory canal and right hypoglossal canal.   Cor- track--- for nutritional support   09-27-23 --psychiatry consult-patient lacks capacity for complex medical treatment decisions she has the ability to assent to aspects of care.   6/24- patient transitioned to comfort measures only and referral placed for inpatient hospice   6/26: Spoke with her brother/HPOA- Remene a few times today by phone. Answered his questions about prognosis and  discussing the situation with Oriya. We reviewed Osiri's poor insight and inability to see complete situation, as well as her poor recall of previous discussions. Emotional support provided to Remene as he shared the multiple stressors he has.  Noted that patient has been deemed not current eligible for inpatient hospice care.   Spoke again with Remene- there is no family available to care for Unita outside of hospital. He wishes for her to remain inpatient until either she qualifies for inpt hospice or dies.   Oct 06, 2023: Patient is sitting upright  in bed. Assisted with oral care. She is attempting to drink however with significant shortness of breath and some coughing. Support provided. Patient reports unable to take a deep breath. Feels increasingly short of breath when lying flat. Will keep head of bed at 45-90 degrees for comfort.   PRN medications available for pain and air hunger. Patient denies pain or discomfort on exam. Expresses shortness of breath and feelings of anxiousness. RN to continue to closely assess symptoms and manage aggressively.   Plans remain in place for comfort focused care pending hospital death or further decline allowing for inpatient hospice acceptance. Attempted to reach brother to discuss ongoing options and offer support.    Assessment End of life care  Recommendations/Plan Continue DNR/DNI Continue current comfort interventions, - I anticipate that patient will decline rather quickly due to ongoing dysphagia/aspiration Increased frequency of Robinul  0.4 mg IV to 4 times daily Family is unable to care for patient at home- even with hospice assistance. Defer to [facility] to determine if family interested in LTC with hospice. She has been determined ineligible for [facility] Psychosocial and emotional support provided PMT will continue to follow and support   Prognosis:  < 2 weeks  Discharge Planning: To Be Determined  Care plan was discussed with patient, RN,  MD, LCSW   Total time: I spent 35 minutes in the care of the patient today in the above activities and documenting the encounter.    Amayra Kiedrowski, PA-C Palliative Medicine Team Team phone # [phone]  Thank you for allowing the Palliative Medicine Team to assist in the care of this patient. Please utilize secure chat with additional questions, if there is no response within 30 minutes please call the above phone number.  Palliative Medicine Team providers are available by phone from 7am to 7pm daily and can be reached through the team cell phone.  Should this patient require assistance outside of these hours, please call the patient's attending physician.  Portions of this note are a verbal dictation therefore any spelling and/or grammatical errors are due to the Dragon Medical One system interpretation.

## 2023-10-04 NOTE — Progress Notes (Signed)
 [facility] Progress Note Patient: Elizabeth Mayer FMW:[id number] DOB: 1960-11-28 DOA: 09/19/2023  DOS: the patient was seen and examined on 10/04/2023  Brief Hospital Course: Patient with PMH of COPD, chronic respiratory failure on 2 LPM, stage IV non-small cell lung cancer S/P chemotherapy and radiation with brain metastasis presented with shortness of breath. Last immunotherapy-Keytruda  6/23 Second opinion at [facility] on 8/23 due to side effects from  Original recommendation was discontinuation of the systemic chemotherapy with close monitoring every 3 to 4 months. Last saw Dr. Federico 10/23. Has been seeing Dr. Buckley regularly in 2024.   6/15 presented with shortness of breath admitted for aspiration pneumonia with right middle and lower lobe consolidation secondary to dysphagia from bilateral vocal cord paralysis 6/16 MRI brain with without contrast shows new cerebellopontine angle metastatic lesion with compression of the surrounding area.  New onset A-fib with RVR. 6/17 found lethargic.  ABG pH 7.3/PCO2 81 pO2/229.  Transferred to ICU.  Did not require intubation.  Treated with BiPAP. 6/18 Cortrak inserted for nutrition.  Transferred out of the ICU. 6/20 ENT consulted.  Tracheostomy offered if desired.  MBS shows profound and complex dysphagia due to right hypoglossal and facial nerve impairments.  Seen to have Nonproductive cough. 6/21 rapid response for bradycardia and hypotension.  Beta-blocker stopped. 6/22 radiation oncology was consulted. 6/23 psychiatry was consulted.  Patient lacks capacity for complex medical treatment decision due to delirium, hypoglossal dysfunction as well as possible paraneoplastic processes.  HCPOA documentation was completed with brothers. 6/24 Dr. Izell estimate about a 50-50 chance of some improvement in cranial nerve symptoms but it would not make them go away completely.  Recommended that her prognosis is guarded regardless of intervention. After  discussion with palliative care family decided to transition to comfort care. 6/26 per [facility] currently does not meet criteria for inpatient residential hospice.  Difficult situation, does not have good home support for home hospice and currently not a candidate for inpatient hospice per hospice facility.  Assessment and Plan: Acute hypoxic and hypercapnic and hypoxic respiratory failure.   Severe sepsis secondary to pneumonia. Due to aspiration pneumonia from bilateral vocal cord paralysis, also of a brain mass.  Required ICU admission for closer monitoring but did not require intubation. Oxygenation improved to baseline. Treated with antibiotic. Now comfort care.  Bilateral vocal cord paralysis. Dysphagia. Secondary to metastatic lung cancer. Dysphonia. Sees Dr. Ricci, ENT at [facility], Original plan was outpatient elective tracheostomy. Per Dr. Izell radiation may improve chance of patient able to tolerate oral diet although would not eliminate dysphagia completely.  And also a chance of success is 50-50. Now comfort care.  Appears to be increasing pooling of upper airway secretion.  Add scopolamine patch to Robinul .   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  Associated extension into right internal auditory canal and right hypoglossal canal. Brain mass, facial droop.   MRI shows new cerebellopontine angle mass. Likely responsible for the paralysis. Oncology was consulted. Radiation was offered as a potential option although would not add a quality time to her prognosis. Psychiatry was consulted.  Does not appear to have any capacity for medical decision making. Family decided to transition to comfort.   Paroxysmal A-fib RVR. Bradycardia with hypotension. New diagnosis. Was treated with beta-blocker, later on this was stopped due to  bradycardia. Not anticoagulation candidate due to brain mass  Demand ischemia  from RVR and  hypoxia,  EKG nonischemic.  Troponin [phone]. Not a candidate for anticoagulation due to brain mass.  No chest pain.  Continue supportive care.   COPD with exacerbation secondary pneumonia Treated with steroids and antibiotic and supplemental oxygen . Continue supportive care and inhaler. Upper airway crackles heard.  Capacity evaluation. Psychiatry was consulted on 6/23. On that evaluation that day patient was found not to have capacity to understand medical decision making.  Still able to assent to treatment options.  Diarrhea. Mostly in the setting of tube feeding although continues to have diarrhea despite stopping the tube feeding and being on scheduled Imodium  and scheduled Robinul .  Giving Questran.  Excessive secretion. On scheduled Robinul . Likely secondary to pooling from facial nerve paralysis.   Reports improvement with scopolamine patch. Monitor  Severe protein-calorie malnutrition Body mass index is 19.81 kg/m.  Placing the patient at high risk for poor outcome.  Right facial paralysis. Secondary to CNS tumor metastasis. Monitor for now.   Subjective: Denies any acute complaint.  No nausea no vomiting no fever no chills.  Still has a rectal tube with loose stool.  Physical Exam: Upper airway secretion heard. S1-S2 present. No edema. Alert awake and oriented to self and place  Data Reviewed: I have Reviewed nursing notes, Vitals, and Lab results. Discussed with palliative care as well as TOC. Disposition: Status is: Inpatient Remains inpatient appropriate because: Currently medically stable.  Unsafe to discharge home.  Looking for residential hospice versus facility with hospice   Family Communication: No one at bedside. Level of care: Palliative Care   Vitals:   10/03/23 0500 10/03/23 0845 10/03/23 1630 10/04/23 0510  BP: (!) 93/46 119/64 110/63 (!)  105/55  Pulse: 87 (!) 108 98 80  Resp:  16 17 14   Temp: 98.6 F (37 C) 97.8 F (36.6 C) 98.2 F (36.8 C)   TempSrc:      SpO2: 97% 98% 100% 100%  Weight:      Height:         Author: Yetta Blanch, MD 10/04/2023 4:15 PM  Please look on [email or web address] to find out who is on call.

## 2023-10-05 DIAGNOSIS — J9621 Acute and chronic respiratory failure with hypoxia: Secondary | ICD-10-CM | POA: Diagnosis not present

## 2023-10-05 DIAGNOSIS — R0602 Shortness of breath: Secondary | ICD-10-CM | POA: Diagnosis not present

## 2023-10-05 DIAGNOSIS — R627 Adult failure to thrive: Secondary | ICD-10-CM | POA: Diagnosis not present

## 2023-10-05 DIAGNOSIS — Z515 Encounter for palliative care: Secondary | ICD-10-CM | POA: Diagnosis not present

## 2023-10-05 DIAGNOSIS — C3490 Malignant neoplasm of unspecified part of unspecified bronchus or lung: Secondary | ICD-10-CM | POA: Diagnosis not present

## 2023-10-05 MED ORDER — MORPHINE SULFATE (PF) 2 MG/ML IV SOLN
2.0000 mg | INTRAVENOUS | Status: DC | PRN
  Administered 2023-10-05 – 2023-10-06 (×5): 2 mg via INTRAVENOUS
  Filled 2023-10-05 (×5): qty 1

## 2023-10-05 MED ORDER — LORAZEPAM 2 MG/ML IJ SOLN: 1.0000 mg | Freq: Four times a day (QID) | INTRAMUSCULAR | Status: DC | PRN

## 2023-10-05 MED ORDER — CHOLESTYRAMINE LIGHT 4 G PO PACK
4.0000 g | PACK | Freq: Every day | ORAL | Status: DC
  Administered 2023-10-06: 4 g via ORAL
  Filled 2023-10-05: qty 1

## 2023-10-05 MED ORDER — LORAZEPAM 2 MG/ML IJ SOLN: 1.0000 mg | INTRAMUSCULAR | Status: DC | PRN

## 2023-10-05 NOTE — TOC Progression Note (Signed)
 Transition of Care [facility]) - Progression Note    Patient Details  Name: Elizabeth Mayer MRN: [id number] Date of Birth: 02-Jul-1960  Transition of Care [facility]) CM/SW Contact  Montie LOISE Louder, [place] Phone Number: 10/05/2023, 10:13 AM  Clinical Narrative:     Received call from [facility] SW [place] , informed the patient remains in the hospital.   Montie Louder, MSW, LCSW Clinical Social Worker    Expected Discharge Plan: Skilled Nursing Facility Barriers to Discharge: SNF Pending bed offer  Expected Discharge Plan and Services In-house Referral: Clinical Social Work Discharge Planning Services: CM Consult Post Acute Care Choice: Hospice Living arrangements for the past 2 months: Single Family Home                             [facility] Agency: [facility] Date [facility] Agency Contacted: 09/28/23 Time HH Agency Contacted: 1509 Representative spoke with at [facility] Agency: [place]   Social Determinants of Health (SDOH) Interventions SDOH Screenings   Food Insecurity: No Food Insecurity (09/22/2023)  Housing: Low Risk  (09/22/2023)  Transportation Needs: No Transportation Needs (09/22/2023)  Utilities: Not At Risk (09/22/2023)  Alcohol Screen: Low Risk  (02/15/2023)  Depression (PHQ2-9): Low Risk  (02/15/2023)  Financial Resource Strain: Low Risk  (02/15/2023)  Physical Activity: Sufficiently Active (02/15/2023)  Social Connections: Moderately Isolated (02/15/2023)  Stress: No Stress Concern Present (02/15/2023)  Tobacco Use: Medium Risk (09/19/2023)  Health Literacy: Adequate Health Literacy (02/15/2023)    Readmission Risk Interventions     No data to display

## 2023-10-05 NOTE — Progress Notes (Signed)
 [facility] Progress Note Patient: Elizabeth Mayer FMW:[id number] DOB: Sep 05, 1960 DOA: 09/19/2023  DOS: the patient was seen and examined on 10/05/2023  Brief Hospital Course: Patient with PMH of COPD, chronic respiratory failure on 2 LPM, stage IV non-small cell lung cancer S/P chemotherapy and radiation with brain metastasis presented with shortness of breath. Last immunotherapy-Keytruda  6/23 Second opinion at [facility] on 8/23 due to side effects from  Original recommendation was discontinuation of the systemic chemotherapy with close monitoring every 3 to 4 months. Last saw Dr. Federico 10/23. Has been seeing Dr. Buckley regularly in 2024.   6/15 presented with shortness of breath admitted for aspiration pneumonia with right middle and lower lobe consolidation secondary to dysphagia from bilateral vocal cord paralysis 6/16 MRI brain with without contrast shows new cerebellopontine angle metastatic lesion with compression of the surrounding area.  New onset A-fib with RVR. 6/17 found lethargic.  ABG pH 7.3/PCO2 81 pO2/229.  Transferred to ICU.  Did not require intubation.  Treated with BiPAP. 6/18 Cortrak inserted for nutrition.  Transferred out of the ICU. 6/20 ENT consulted.  Tracheostomy offered if desired.  MBS shows profound and complex dysphagia due to right hypoglossal and facial nerve impairments.  Seen to have Nonproductive cough. 6/21 rapid response for bradycardia and hypotension.  Beta-blocker stopped. 6/22 radiation oncology was consulted. 6/23 psychiatry was consulted.  Patient lacks capacity for complex medical treatment decision due to delirium, hypoglossal dysfunction as well as possible paraneoplastic processes.  HCPOA documentation was completed with brothers. 6/24 Dr. Izell estimate about a 50-50 chance of some improvement in cranial nerve symptoms but it would not make them go away completely.  Recommended that her prognosis is guarded regardless of intervention. After  discussion with palliative care family decided to transition to comfort care. 6/26 per [facility] currently does not meet criteria for inpatient residential hospice.  Difficult situation, does not have good home support for home hospice and currently not a candidate for inpatient hospice per hospice facility.  TOC looking for potential option for placement at SNF with hospice.  Assessment and Plan: Acute hypoxic and hypercapnic and hypoxic respiratory failure.   Severe sepsis secondary to pneumonia. Due to aspiration pneumonia from bilateral vocal cord paralysis, also of a brain mass.  Required ICU admission for closer monitoring but did not require intubation. Oxygenation improved to baseline. Treated with antibiotic. Now comfort care.  Bilateral vocal cord paralysis. Dysphagia. Secondary to metastatic lung cancer. Dysphonia. Sees Dr. Ricci, ENT at [facility], Original plan was outpatient elective tracheostomy. Per Dr. Izell radiation may improve chance of patient able to tolerate oral diet although would not eliminate dysphagia completely.  And also a chance of success is 50-50. Now comfort care.  Appears to be increasing pooling of upper airway secretion.  Add scopolamine patch to Robinul .   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  Associated extension into right internal auditory canal and right hypoglossal canal. Brain mass, facial droop.   MRI shows new cerebellopontine angle mass. Likely responsible for the paralysis. Oncology was consulted. Radiation was offered as a potential option although would not add a quality time to her prognosis. Psychiatry was consulted.  Does not appear to have any capacity for medical decision making. Family decided to transition to comfort.   Paroxysmal A-fib RVR. Bradycardia with hypotension. New  diagnosis. Was treated with beta-blocker, later on this was stopped due to bradycardia. Not anticoagulation  candidate due to brain mass  Demand ischemia  from RVR and hypoxia,  EKG nonischemic.  Troponin [id number]. Not a candidate for anticoagulation due to brain mass.  No chest pain.  Continue supportive care.   COPD with exacerbation secondary pneumonia Treated with steroids and antibiotic and supplemental oxygen . Continue supportive care and inhaler. Upper airway crackles heard.  Capacity evaluation. Psychiatry was consulted on 6/23. On that evaluation that day patient was found not to have capacity to understand medical decision making.  Still able to assent to treatment options.  Diarrhea. Mostly in the setting of tube feeding although continues to have diarrhea despite stopping the tube feeding and being on scheduled Imodium  and scheduled Robinul .  Giving Questran.  Excessive secretion. On scheduled Robinul . Likely secondary to pooling from facial nerve paralysis.   Reports improvement with scopolamine patch. Monitor  Severe protein-calorie malnutrition Body mass index is 19.81 kg/m.  Placing the patient at high risk for poor outcome.  Right facial paralysis. Secondary to CNS tumor metastasis. Monitor for now.   Subjective: No nausea no vomiting no fever no chills.  Unchanged diarrhea.  Reports stomach pain.  Physical Exam: General: in Mild distress, No Rash Cardiovascular: S1 and S2 Present, No Murmur Respiratory: Good respiratory effort, Bilateral Air entry present. No Crackles, No wheezes Abdomen: Bowel Sound present, No tenderness, reports some pressure in the suprapubic region. Extremities: No edema Neuro: Alert and oriented to self and place, no new focal deficit, chronic right facial droop as well as dysarthria  Data Reviewed: I have Reviewed nursing notes, Vitals, and Lab results. Discussed with palliative care  Disposition: Status is:  Inpatient Remains inpatient appropriate because: Continue comfort care  Family Communication: No one at bedside Level of care: Palliative Care   Vitals:   10/04/23 0510 10/04/23 1942 10/05/23 0403 10/05/23 1747  BP: (!) 105/55  (!) 106/59 120/74  Pulse: 80  86 (!) 101  Resp: 14  17 16   Temp:   98.2 F (36.8 C) 98 F (36.7 C)  TempSrc:   Oral Oral  SpO2: 100% 98% 97% 100%  Weight:      Height:         Author: Yetta Blanch, MD 10/05/2023 6:07 PM  Please look on [email or web address] to find out who is on call.

## 2023-10-05 NOTE — Progress Notes (Signed)
 Patient ID: Elizabeth Mayer, female   DOB: Mar 13, 1961, 63 y.o.   MRN: [id number]    Progress Note from the Palliative Medicine Team at [facility]   Patient Name: Elizabeth Mayer        Date: 10/05/2023 DOB: 19-May-1960  Age: 63 y.o. MRN#: [id number] Attending Physician: Elizabeth Yetta HERO, MD Primary Care Physician: Elizabeth Speaks, FNP Admit Date: 09/19/2023   Reason for Consultation/Follow-up   Establishing Goals of Care   HPI/ Brief Hospital Review 63 y.o. female  with past medical history of COPD, afib- not on anticoag, chronic resp failure on 2-3 L oxygen  at home, NSCLC- s/p chemotherapy and SRS to brain- she has not had treatment since August 2023 and lung and brain disease has been stable on surveillance- she elected to defer any followup with medical oncology Dr. Federico since 2023, and has not seen neuro-oncology Dr. Buckley since October 2024 - at which time there was mixed findings on brain MRI with two lesions slightly progressed and she expressed she did not want any further CNS directed treatments admitted on 09/19/2023 with shortness of breath. Workup reveals sepsis due to aspiration pneumonia. Elizabeth Mayer MRI of brain showed new progressing brain mass.   MRI 09/20/2023       Severe dysphagia and nerve paralysis-- significant for heterogeneously enhancing mass along the right cerebellopontine angle cistern, measuring  36 x 18 mm with features suspicious for metastasis and associated extension into the right internal auditory canal and right hypoglossal canal.   Cor- track--- for nutritional support  09-27-23 --psychiatry consult-patient lacks capacity for complex medical treatment decisions she has the ability to assent to aspects of care.    Patient and family face treatment option decisions, advanced directive decisions and anticipatory care needs.   Subjective  Extensive chart review has been completed prior to meeting with patient/family  including labs, vital signs, imaging,  progress/consult notes, orders, medications and available advance directive documents.    This NP assessed patient at the bedside as a follow up for palliative medicine needs and emotional support.   And to meet with patient's brother/ Elizabeth Mayer  for for further clarification of goals of care, specific to treatment option decisions, advanced directive decisions and anticipatory care needs.   [name] remains weak, her voice is soft, making communication difficult.    She is visibly declining; taking only sips and bites and coughing/aspirating with each attempt.    C/O cough   As discussing in detail with family focus  of  care is comfort and dignity allowing for a natural death .   Spoke to brother/HPOA/Elizabeth by telephone   Patient's brother/ Elizabeth Mayer  understands the serious life limiting illness and limited prognosis.  Reinforced education offered on the difference between a full medical support path attempting to prolong life versus a palliative/supportive path focusing on comfort and dignity allowing for natural death.  Education offered on the natural trajectory and expectations at end-of-life, questions and concerns addressed.  Plan of care - DNR/DNI - No artifical feeding or hydration now or in the future/comfort feeds as tolerated ( actively aspirating, education offered to patient and family, accepting risk)  - No further diagnostics or treatment for underlying cancer diagnosis, forego radiation - Symptom management     - Dyspnea/pain/cough- Morphine  2 mg IV every 15 minutes as needed      - Anxiety- Ativan  1 mg IV every 4 hrs as needed     - Increased oral pharyngeal secretions- Robinul  0.4 mg  every 6 hrs scheduled     - dc rectal tube    - MOST form completed to reflect full comfort -  IPU for end-of-life care-- prognosis sis less than two weeks; hardly taking sips and bites and actively aspiration on each attempt, visibly losing weight, weaker and more anxious, decreased  urine output     Education offered on hospice benefit; philosophy and eligibility.  Education offered on inpatient hospice unit.       Discussed with primary attending, TOC  and nursing staff  PMT will continue to support holistically   Time:  50  minutes  Detailed review of medical records ( labs, imaging, vital signs), medically appropriate exam ( MS, skin, cardiac,  resp)   discussed with treatment team, counseling and education to patient, family, staff, documenting clinical information, medication management, coordination of care    Elizabeth Plants NP  Palliative Medicine Team Team Phone # [phone] Pager [phone]

## 2023-10-05 NOTE — TOC Progression Note (Addendum)
 Transition of Care [facility]) - Progression Note    Patient Details  Name: Elizabeth Mayer MRN: [id number] Date of Birth: Oct 15, 1960  [facility] CM/SW Contact  Luann SHAUNNA [place], [place] Phone Number: 10/05/2023, 12:41 PM  Clinical Narrative:     CSW spoke with PMT provider. Plan to try for different inpatient hospice facility. CSW called pt's brother to discuss; [facility] choice [facility]  CSW made referral. HOP will review and update csw with decision.   1320: Medical director at [facility] reviewing referral though they do not have beds available today.   Expected Discharge Plan: Skilled Nursing Facility Barriers to Discharge: SNF Pending bed offer  Expected Discharge Plan and Services In-house Referral: Clinical Social Work Discharge Planning Services: CM Consult Post Acute Care Choice: Hospice Living arrangements for the past 2 months: Single Family Home                             [facility] Agency: [facility] Date [facility] Agency Contacted: 09/28/23 Time HH Agency Contacted: 1509 Representative spoke with at [facility] Agency: Melissa   Social Determinants of Health (SDOH) Interventions SDOH Screenings   Food Insecurity: No Food Insecurity (09/22/2023)  Housing: Low Risk  (09/22/2023)  Transportation Needs: No Transportation Needs (09/22/2023)  Utilities: Not At Risk (09/22/2023)  Alcohol Screen: Low Risk  (02/15/2023)  Depression (PHQ2-9): Low Risk  (02/15/2023)  Financial Resource Strain: Low Risk  (02/15/2023)  Physical Activity: Sufficiently Active (02/15/2023)  Social Connections: Moderately Isolated (02/15/2023)  Stress: No Stress Concern Present (02/15/2023)  Tobacco Use: Medium Risk (09/19/2023)  Health Literacy: Adequate Health Literacy (02/15/2023)    Readmission Risk Interventions     No data to display

## 2023-10-06 DIAGNOSIS — I10 Essential (primary) hypertension: Secondary | ICD-10-CM

## 2023-10-06 DIAGNOSIS — R7989 Other specified abnormal findings of blood chemistry: Secondary | ICD-10-CM | POA: Diagnosis not present

## 2023-10-06 DIAGNOSIS — J9621 Acute and chronic respiratory failure with hypoxia: Secondary | ICD-10-CM | POA: Diagnosis not present

## 2023-10-06 DIAGNOSIS — Z515 Encounter for palliative care: Secondary | ICD-10-CM

## 2023-10-06 DIAGNOSIS — Z66 Do not resuscitate: Secondary | ICD-10-CM | POA: Diagnosis not present

## 2023-10-06 DIAGNOSIS — F05 Delirium due to known physiological condition: Secondary | ICD-10-CM | POA: Diagnosis not present

## 2023-10-06 MED ORDER — GLYCOPYRROLATE 0.2 MG/ML IJ SOLN: 0.4000 mg | Freq: Four times a day (QID) | INTRAMUSCULAR | Status: DC

## 2023-10-06 MED ORDER — ONDANSETRON HCL 4 MG/2ML IJ SOLN: 4.0000 mg | Freq: Four times a day (QID) | INTRAMUSCULAR | Status: DC | PRN

## 2023-10-06 MED ORDER — ARTIFICIAL TEARS OPHTHALMIC OINT: TOPICAL_OINTMENT | OPHTHALMIC | Status: DC | PRN

## 2023-10-06 MED ORDER — MORPHINE SULFATE (PF) 2 MG/ML IV SOLN: 2.0000 mg | INTRAVENOUS | Status: DC | PRN

## 2023-10-06 MED ORDER — LORAZEPAM 2 MG/ML IJ SOLN: 1.0000 mg | INTRAMUSCULAR | Status: DC | PRN

## 2023-10-07 ENCOUNTER — Encounter: Payer: Self-pay | Admitting: Hematology and Oncology

## 2023-10-07 ENCOUNTER — Encounter: Payer: Self-pay | Admitting: Physician Assistant

## 2023-10-19 ENCOUNTER — Telehealth: Payer: Self-pay | Admitting: Nurse Practitioner

## 2023-10-19 NOTE — Telephone Encounter (Signed)
 Called brother Remene Marylen (brother) to express our condolences, he did express she passed peacefully at [facility].

## 2023-11-05 NOTE — Discharge Summary (Signed)
 Physician Discharge Summary  LORINDA COPLAND FMW:[id number] DOB: October 08, 1960 DOA: 09/19/2023  PCP: Georgina Speaks, FNP  Admit date: 09/19/2023 Discharge date: 10/06/23  Admitted From: Home Disposition: Residential hospice for end-of-life care   Discharge Condition: Stable for transfer CODE STATUS: DNR   Hospital course 63 year old F with PMH of COPD, chronic RF on 2 LPM, dysphagia, bilateral vocal cord paralysis, stage IV NSCLC s/p with brain metastasis chemotherapy and radiation with presenting with shortness of breath, and admitted with severe sepsis in the setting of aspiration pneumonia with acute on chronic hypoxic respiratory failure. Last immunotherapy-Keytruda  6/23.  Second opinion at [facility] on 8/23 due to side effects from.  Original recommendation was discontinuation of the systemic chemotherapy with close monitoring every 3 to 4 months. Last saw Dr. Federico 10/23. Has been seeing Dr. Buckley regularly in 2024.    6/16 MRI brain with without contrast shows new cerebellopontine angle metastatic lesion with compression of the surrounding area.  New onset A-fib with RVR. 6/17 found lethargic.  ABG pH 7.3/PCO2 81 pO2/229.  Transferred to ICU.  Required BiPAP. 6/18 Cortrak inserted for nutrition.  Transferred out of the ICU. 6/20 ENT consulted.  Tracheostomy offered if desired.  MBS shows profound and complex dysphagia due to right hypoglossal and facial nerve impairments.  Seen to have Nonproductive cough. 6/21 rapid response for bradycardia and hypotension.  Beta-blocker stopped. 6/22 radiation oncology was consulted. 6/23 psychiatry was consulted.  Patient lacks capacity for complex medical treatment decision due to delirium, hypoglossal dysfunction as well as possible paraneoplastic processes.  HCPOA documentation was completed with brothers. 6/24 Dr. Izell estimate about a 50-50 chance of some improvement in cranial nerve symptoms but it would not make them go away completely.   Recommended that her prognosis is guarded regardless of intervention. After discussion with palliative care family decided to transition to comfort care.   7/2-patient is discharged to residential hospice  See individual problem list below for more.   Problems addressed during this hospitalization Acute hypoxic and hypercapnic and hypoxic respiratory failure.   Severe sepsis secondary to aspiration pneumonia due to dysphagia and vocal cord paralysis. -Completed antibiotic course.  Dysphagia/bilateral vocal cord paralysis. Secondary to metastatic lung cancer. Dysphonia/Hypophonia. Sees Dr. Ricci, ENT at [facility], Per Dr. Izell radiation may improve chance of patient able to tolerate oral diet although would not eliminate dysphagia completely.  And also a chance of success is 50-50. Now comfort care.  Scopolamine patch and Robinul .   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  Associated extension into right internal auditory canal and right hypoglossal canal. Brain mass, facial droop.   MRI shows new cerebellopontine angle mass. Likely responsible for the paralysis. Oncology was consulted. Radiation was offered as a potential option although would not add a quality time to her prognosis. Psychiatry was consulted.  Does not appear to have any capacity for medical decision making. Family decided to transition to comfort.   Paroxysmal A-fib RVR. Bradycardia with hypotension. New diagnosis. Was treated with beta-blocker, later on this was stopped due to bradycardia. Not anticoagulation candidate due to brain mass   Demand ischemia  from RVR and hypoxia,  EKG nonischemic.  Troponin [phone]. Not a candidate for anticoagulation due to brain mass.  No chest pain.  Continue supportive care.   COPD with exacerbation secondary pneumonia Treated with steroids and antibiotic  and supplemental oxygen . Continue supportive care and inhaler. Upper airway  crackles heard.   Capacity evaluation. Psychiatry was consulted on 6/23. Deemed to have no capacity to make medical decisions. Still able to assent to treatment options.  Right facial paralysis. Secondary to CNS tumor metastasis. Monitor for now.    Diarrhea felt to be due to tube feed.  Treated with Questran.  End of life care/DNR -Appreciate help by palliative -Full comfort care -Discharge to residential hospice for end of life care   End-of-life care   Severe protein-calorie malnutrition/failure to thrive Body mass index is 19.81 kg/m. Nutrition Problem: Severe Malnutrition Etiology: cancer and cancer related treatments, chronic illness Signs/Symptoms: severe fat depletion, severe muscle depletion, percent weight loss Percent weight loss: 19 % Interventions: Refer to RD note for recommendations     Consultations: PCCM ENT Oncology Radiation oncology Palliative medicine  Time spent 35  minutes  Vital signs Vitals:   10/05/23 0403 10/05/23 1747 10/06/23 0329 10/06/23 0917  BP: (!) 106/59 120/74 125/87 127/71  Pulse: 86 (!) 101 88 90  Temp: 98.2 F (36.8 C) 98 F (36.7 C) 97.8 F (36.6 C) 98.3 F (36.8 C)  Resp: 17 16 17 18   Height:      Weight:      SpO2: 97% 100% 97% 100%  TempSrc: Oral Oral Oral Oral  BMI (Calculated):         Discharge exam  GENERAL: No apparent distress.  Appears frail. HEENT: MMM.  Vision and hearing grossly intact. Hypophonia NECK: Supple.  No apparent JVD.  RESP:  No IWOB.  Fair aeration bilaterally. CVS:  RRR. Heart sounds normal.  ABD/GI/GU: BS+. Abd soft, NTND.  MSK/EXT:  Significant muscle mass and subcutaneous fat loss SKIN: no apparent skin lesion or wound NEURO: Awake and alert. Oriented self. Follows command  PSYCH: Calm. Normal affect.   Discharge Instructions Discharge Instructions     Diet - low sodium heart healthy   Complete by:  As directed    Increase activity slowly   Complete by: As directed       Allergies as of 10/06/2023       Reactions   Sudafed [pseudoephedrine] Hypertension   Amlodipine  Swelling   Atorvastatin  Other (See Comments)   Cramping   Gabapentin Other (See Comments)   Raise blood pressure and red rings around eyes Blood vessels popped in her eyes   Mobic  [meloxicam ] Swelling   Inflamed the area that has inflammation and stabbing pains in the area   Penicillins Hives    Childhood reaction   Prednisone     Caused A-fib. Pt refusing any kind of prednisone  but pt tolerates methylprednisolone          Medication List     STOP taking these medications    acetaminophen  500 MG tablet Commonly known as: TYLENOL    ALIVE MULTI-VITAMIN PO   ALPRAZolam  1 MG tablet Commonly known as: Xanax    azelastine 0.1 % nasal spray Commonly known as: ASTELIN   Breyna  160-4.5 MCG/ACT inhaler Generic drug: budesonide -formoterol    Breztri  Aerosphere 160-9-4.8 MCG/ACT Aero inhaler Generic drug: budesonide -glycopyrrolate -formoterol    camphor-menthol lotion Commonly known as: SARNA   carboxymethylcellulose 0.5 % Soln Commonly known as: REFRESH PLUS   CeraVe Acne Foaming Cream 4 % external liquid Generic drug: benzoyl peroxide   cetaphil lotion   clindamycin  1 % external solution Commonly known as: CLEOCIN  T   erythromycin ophthalmic ointment   fexofenadine 180 MG tablet Commonly known as: ALLEGRA   ipratropium 0.03 % nasal spray Commonly known as: ATROVENT   magic mouthwash Soln  triamcinolone ointment 0.1 % Commonly known as: KENALOG   Ventolin  HFA 108 (90 Base) MCG/ACT inhaler Generic drug: albuterol        TAKE these medications    artificial tears Oint ophthalmic ointment Commonly known as: LACRILUBE Place into both eyes every 4 (four) hours as needed for dry eyes.   glycopyrrolate  0.2 MG/ML injection Commonly known as: ROBINUL  Inject 2 mLs (0.4 mg total) into the  vein 4 (four) times daily.   LORazepam  2 MG/ML injection Commonly known as: ATIVAN  Inject 0.5 mLs (1 mg total) into the vein every 4 (four) hours as needed for anxiety.   morphine  (PF) 2 MG/ML injection Inject 1 mL (2 mg total) into the vein every 15 (fifteen) minutes as needed (any discomfort, air hunger, SOB).   ondansetron  4 MG/2ML Soln injection Commonly known as: ZOFRAN  Inject 2 mLs (4 mg total) into the vein every 6 (six) hours as needed for nausea or vomiting.         Procedures/Studies: None   DG Swallowing Func-Speech Pathology Result Date: 09/24/2023 Table formatting from the original result was not included. Modified Barium Swallow Study Patient Details Name: CHINELO BENN MRN: [id number] Date of Birth: 03/25/1961 Today's Date: 09/24/2023 HPI/PMH: HPI: Pt is a 63 year old female who is admitted to [facility] on 09/19/2023 with severe sepsis due to community-acquired pneumonia after presenting from home to [facility] ED complaining of shortness of breath.  Pt initially reported hearing and swallowing [facility] ENT on 08/26/23.  Right facial nerve paralysis documented as new that date as well. Pt stated at that time that her voice had been very bad for two weeks prior to appointment but also had been reporting voice changes since radiation for metastatic lung cancer in 2020 and ENT scoped pt and diagnosed with left vocal cord immobility since 2020, but now NEW bilateral vocal cord paralysis on strobeoscopy with a 2-3 mm glottic gap, right sensorineural hearing loss and new right facial nerve paralysis. Pt was scheduled for a CT neck, an MBS (that was scheduled in [place] for 6/16 - day of admission to [facility] so not completed), and strict precautions for worsening airway stridor with concern for potential need for trach. PMH additionally significant for COPD, paroxysmal atrial fibrillation not on chronic anticoagulation, chronic hypoxic respiratory failure on 2 to 3 L continuous nasal  cannula, non-small cell lung cancer with history of metastatic disease to the brain. Clinical Impression: Pt demonstrates a profound and complex dysphagia. SLP mostly let pt use her own methods to eat and drink initially to evaluate her natural compensations. Throughout pt denies that she is having any trouble.   Pt has a severe oral dysphagia due to right hypoglossal and facial nerve impairments. Pt can only achieve weak negative pressure on a straw placed deep in the left buccal cavity. There are small repetitive lingual pumps to transit a small bolus, pt uses a slight posterior head tilt to transit. Cannot attempt chin tuck. Struggles with any positional changes ROM is poor and position for straw has to be quite precise.  Liquids spill to airway and pyriforms with aspiration before the swallow and inconsistent cough or throat clear. There is decreased seal of velum on posterior pharyngeal wall, decreased movement of base of tongue, incomplete epiglottic deflection, incomplete hyoid excursion, no appreciation of upper pharyngeal wall peristalsis and minimal opening of PES. There is severe residue in the pyriform sinuses with liquids. A head turn to the right does increase PES opening with liquids,  but does not eliminate pooling of residue in (suspected right) pyriform. Aspiration of thin liquids only was moderate, but after a single 1/2 teaspoon of puree, which pt could not propel through oropharynx without multiple liquid washes, residue ultimately spilled into airway resulting in severe aspiration of solid and liquid mixture. Pt did have a cough response, but has no ability to expectorate aspirate up from trachea. Her cough is completely non protective.  SLP attempted to use visual feedback to explain that her coughing meant that the little bit of food and drink she had attempted had gone down the wrong pipe and could hurt her lungs.Pt again reports she swallowed fine at home. Asked pt if there is something else  she does at home that helps her swallow. Pt says she does well with small bites of meat. Expressed concern about solids like meat blocking her airway, and pt agreed soft foods are better. SLP agreed, but also pointed out that if she wanted to eat soft foods, she would have to know that they are also going into her lungs. There were no fully effective modifications or strategies discovered on this test. Would advise letting pt sip water  after oral care for her comfort. Water  free of food particulates and with good oral hygiene may be tolerated in small sips. Recommend continuing to provide education to pt about condition and allowing her to make decisions about her preferred oral intake, though risk of negative consequences of aspiration are high. Factors that may increase risk of adverse event in presence of aspiration Noe & Lianne 2021): Factors that may increase risk of adverse event in presence of aspiration Noe & Lianne 2021): Frail or deconditioned; Weak cough; Frequent aspiration of large volumes; Aspiration of thick, dense, and/or acidic materials Recommendations/Plan: Swallowing Evaluation Recommendations Swallowing Evaluation Recommendations Recommendations: Free water  protocol after oral care Liquid Administration via: Straw Medication Administration: Via alternative means Supervision: Patient able to self-feed Swallowing strategies  : Slow rate; Small bites/sips; Head turn right during swallowing Postural changes: Position pt fully upright for meals; Stay upright 30-60 min after meals Oral care recommendations: Oral care QID (4x/day); Oral care before ice chips/water  Caregiver Recommendations: Have oral suction available Treatment Plan Treatment Plan Treatment recommendations: Therapy as outlined in treatment plan below Follow-up recommendations: Follow physicians's recommendations for discharge plan and follow up therapies Functional status assessment: Patient has had a recent decline in their  functional status and demonstrates the ability to make significant improvements in function in a reasonable and predictable amount of time. Treatment frequency: Min 2x/week Treatment duration: 2 weeks Interventions: Aspiration precaution training; Compensatory techniques; Patient/family education; Respiratory muscle strength training Recommendations Recommendations for follow up therapy are one component of a multi-disciplinary discharge planning process, led by the attending physician.  Recommendations may be updated based on patient status, additional functional criteria and insurance authorization. Assessment: Orofacial Exam: Orofacial Exam Oral Cavity - Dentition: Adequate natural dentition Anatomy: Anatomy: Suspected cervical osteophytes (C4/5) Boluses Administered: Boluses Administered Boluses Administered: Thin liquids (Level 0); Puree  Oral Impairment Domain: Oral Impairment Domain Lip Closure: No labial escape Tongue control during bolus hold: Not tested Bolus preparation/mastication: -- (NT) Bolus transport/lingual motion: Repetitive/disorganized tongue motion Oral residue: Residue collection on oral structures Location of oral residue : Floor of mouth; Lateral sulci; Tongue Initiation of pharyngeal swallow : Pyriform sinuses  Pharyngeal Impairment Domain: Pharyngeal Impairment Domain Soft palate elevation: Trace column of contrast or air between SP and PW Laryngeal elevation: Complete superior movement of thyroid  cartilage with complete  approximation of arytenoids to epiglottic petiole Anterior hyoid excursion: Partial anterior movement Epiglottic movement: Partial inversion Laryngeal vestibule closure: Incomplete, narrow column air/contrast in laryngeal vestibule Pharyngeal stripping wave : Present - diminished Pharyngeal contraction (A/P view only): -- (N/T but suspect right later bulging) Pharyngoesophageal segment opening: Minimal distention/minimal duration, marked obstruction of flow Tongue base  retraction: Narrow column of contrast or air between tongue base and PPW Pharyngeal residue: Majority of contrast within or on pharyngeal structures Location of pharyngeal residue: Pyriform sinuses  Esophageal Impairment Domain: No data recorded Pill: Pill Consistency administered: -- (NT) Penetration/Aspiration Scale Score: Penetration/Aspiration Scale Score 7.  Material enters airway, passes BELOW cords and not ejected out despite cough attempt by patient: Thin liquids (Level 0); Puree 8.  Material enters airway, passes BELOW cords without attempt by patient to eject out (silent aspiration) : Thin liquids (Level 0); Puree Compensatory Strategies: Compensatory Strategies Compensatory strategies: Yes Straw: Effective Multiple swallows: Ineffective Liquid wash: Effective Effective Liquid Wash: Thin liquid (Level 0); Puree Left head turn: Ineffective Right head turn: Effective   General Information: Caregiver present: No  Diet Prior to this Study: NPO; Cortrak/Small bore NG tube   No data recorded  Respiratory Status: WFL   Supplemental O2: Nasal cannula   No data recorded Behavior/Cognition: Alert; Cooperative Self-Feeding Abilities: Able to self-feed Baseline vocal quality/speech: Hypophonia/low volume; Abnormal resonance Volitional Cough: Able to elicit Volitional Swallow: Unable to elicit Exam Limitations: No limitations Goal Planning: Prognosis for improved oropharyngeal function: Guarded Barriers to Reach Goals: Overall medical prognosis; Severity of deficits No data recorded No data recorded Consulted and agree with results and recommendations: Patient Pain: Pain Assessment Pain Assessment: Faces Faces Pain Scale: 0 Pain Intervention(s): Monitored during session End of Session: Start Time:SLP Start Time (ACUTE ONLY): 1225 Stop Time: SLP Stop Time (ACUTE ONLY): 1257 Time Calculation:SLP Time Calculation (min) (ACUTE ONLY): 32 min Charges: SLP Evaluations $ SLP Speech Visit: 1 Visit SLP Evaluations $MBS Swallow:  1 Procedure $Swallowing Treatment: 1 Procedure SLP visit diagnosis: SLP Visit Diagnosis: Dysphagia, oropharyngeal phase (R13.12) Past Medical History: Past Medical History: Diagnosis Date  Anemia   Angina 1982  related to stress  Asthma   in the past,  no current problems  Atrial fibrillation (HCC)   Complication of anesthesia   states anesthesia made her hair fall out, old meds. hard to awaken 1 time she took Flexeril  before.  Constipation   COPD (chronic obstructive pulmonary disease) (HCC)   Diabetes mellitus without complication (HCC)   Dyspnea   on oxygen  at home - 3L via [place]  Dysrhythmia   Fall 06/01/2023  Fatigue   GERD (gastroesophageal reflux disease)   patient denies this dx  Heart murmur 1970s  no problems currently  Hypertension   Ingrown toenail   Lung cancer (HCC) 10/2019  metastatic disease to the brain  On home oxygen  therapy   2L via [place] - 24 hours a day  Past heart attack 1980-1981  pt states she passed out and woke up in hospital- told she had heart attack, but then dr said he couldn't find anything wrong.  Peripheral vascular disease (HCC)   Pneumonia   x 1 Past Surgical History: Past Surgical History: Procedure Laterality Date  CERVICAL DISC SURGERY  2000  Disc removed from neck   ingrown toe nail surgery Bilateral   IR IMAGING GUIDED PORT INSERTION  02/16/2020  MULTIPLE TOOTH EXTRACTIONS    for braces  RADIOLOGY WITH ANESTHESIA N/A 12/05/2019  Procedure: MRI BRAIN WITH AND  WITHOUT CONTRAST;  Surgeon: Radiologist, Medication, MD;  Location: [facility] OR;  Service: Radiology;  Laterality: N/A;  RADIOLOGY WITH ANESTHESIA N/A 01/02/2020  Procedure: MRI BRAIN WITH AND WITHOUT CONTRAST;  Surgeon: Radiologist, Medication, MD;  Location: [facility] OR;  Service: Radiology;  Laterality: N/A;  RADIOLOGY WITH ANESTHESIA N/A 02/20/2020  Procedure: MRI WITH ANESTHESIA OF BRAIN WITH AND WITHOUT CONTRAST;  Surgeon: Radiologist, Medication, MD;  Location: [facility] OR;  Service: Radiology;  Laterality: N/A;  RADIOLOGY WITH ANESTHESIA  N/A 06/11/2020  Procedure: MRI WITH ANESTHESIA OF BRAIN WITH AND WITHOUT CONTRAST;  Surgeon: Radiologist, Medication, MD;  Location: [facility] OR;  Service: Radiology;  Laterality: N/A;  RADIOLOGY WITH ANESTHESIA N/A 10/10/2020  Procedure: MRI WITH ANESTHESIA BRAIN WITH AND WITHOUT CONTRAST;  Surgeon: Radiologist, Medication, MD;  Location: [facility] OR;  Service: Radiology;  Laterality: N/A;  RADIOLOGY WITH ANESTHESIA N/A 01/30/2021  Procedure: MRI BRAIN WITH AND WITHOUT CONTRASTWITH ANESTHESIA;  Surgeon: Radiologist, Medication, MD;  Location: [facility] OR;  Service: Radiology;  Laterality: N/A;  RADIOLOGY WITH ANESTHESIA N/A 06/12/2021  Procedure: MRI WITH ANESTHESIA OF BRAIN WITH AND WITHOUT CONTRAST;  Surgeon: Radiologist, Medication, MD;  Location: [facility] OR;  Service: Radiology;  Laterality: N/A;  RADIOLOGY WITH ANESTHESIA N/A 09/11/2021  Procedure: MRI OF BRAIN WITH AND WITHOUT CONTRAST WITH ANESTHESIA;  Surgeon: Radiologist, Medication, MD;  Location: [facility] OR;  Service: Radiology;  Laterality: N/A;  RADIOLOGY WITH ANESTHESIA N/A 01/15/2022  Procedure: MRI BRAIN WITH AND WITHOUT CONTRAST WITH ANESTHESIA;  Surgeon: Radiologist, Medication, MD;  Location: [facility];  Service: Radiology;  Laterality: N/A;  RADIOLOGY WITH ANESTHESIA N/A 05/14/2022  Procedure: MRI WITH ANESTHESIA BRAIN WITH AND WITHOUT CONTRAST;  Surgeon: Radiologist, Medication, MD;  Location: [facility] OR;  Service: Radiology;  Laterality: N/A;  TONSILLECTOMY   DeBlois, Consuelo Fitch 09/24/2023, 1:54 PM  DG CHEST PORT 1 VIEW Result Date: 09/23/2023 CLINICAL DATA:  [id number] Status post thoracentesis [id number] EXAM: PORTABLE CHEST - 1 VIEW COMPARISON:  September 22, 2023 FINDINGS: Unchanged moderate right pleural effusion with adjacent masslike consolidation in the hilar region, likely compressive atelectasis or pneumonia. Small left pleural effusion. No pneumothorax. Right chest port in place terminating in the high right atrium. Weighted feeding tube courses below the diaphragm with the  distal tip not included in the field of view. Unchanged cardiac silhouette. IMPRESSION: No significant interval change to the lungs. Electronically Signed   By: Rogelia Myers M.D.   On: 09/23/2023 12:33   CT SOFT TISSUE NECK W CONTRAST Result Date: 09/23/2023 CLINICAL DATA:  Cranial nerve palsies, multiple (CN 9-12) EXAM: CT NECK WITH CONTRAST TECHNIQUE: Multidetector CT imaging of the neck was performed using the standard protocol following the bolus administration of intravenous contrast. RADIATION DOSE REDUCTION: This exam was performed according to the departmental dose-optimization program which includes automated exposure control, adjustment of the mA and/or kV according to patient size and/or use of iterative reconstruction technique. CONTRAST:  75mL OMNIPAQUE  IOHEXOL  350 MG/ML SOLN COMPARISON:  CT neck 03/22/2020. FINDINGS: Pharynx and larynx: Normal. No mass or swelling. Salivary glands: No inflammation, mass, or stone. Thyroid : Normal. Lymph nodes: Continue decrease in bulk of previously treated posterior left lower neck lymph nodes with loss of fat planes and poor definition. No well defined/measurable index lymph nodes. Vascular: Not well assessed on this non arterial study. Major arteries are grossly patent in the neck. Limited intracranial: Partially imaged right CP angle mass, better characterized on MRI head from September 20, 2023. Visualized orbits: Not imaged. Mastoids and visualized paranasal sinuses: No mastoid  effusions. Partially imaged sinuses are clear. Skeleton: No evidence of acute abnormality on limited assessment. Solid C5-C6 bony fusion. Upper chest: Please see recent CTA of the chest on September 20, 2023 for intrathoracic findings. IMPRESSION: 1. Post treatment related changes in the neck with continued treatment response and no pathologically enlarged lymph nodes identified in the neck. 2. Partially imaged right CP angle mass, better characterized on MRI head from September 20, 2023.  Electronically Signed   By: Gilmore GORMAN Molt M.D.   On: 09/23/2023 01:48   CT CHEST W CONTRAST Result Date: 09/22/2023 EXAM: CT CHEST WITH CONTRAST 09/22/2023 04:35:57 PM TECHNIQUE: CT of the chest was performed with the administration of intravenous contrast. Multiplanar reformatted images are provided for review. Automated exposure control, iterative reconstruction, and/or weight based adjustment of the mA/kV was utilized to reduce the radiation dose to as low as reasonably achievable. COMPARISON: Chest radiograph earlier today and CTA chest dated 05/23/2023. CLINICAL HISTORY: Dyspnea, chronic, unclear etiology. 63 y.o. female with past medical history of COPD, afib- not on anticoag, chronic resp failure on 2-3 L oxygen  at home, NSCLC- s/p chemotherapy and SRS to brain- she has not had treatment since August 2023 and lung and brain disease has been stable on surveillance- she elected to defer any followup with medical oncology since 2023, and has not seen neurooncology since October 2024 - at which time there was mixed findings on brain MRI with two lesions slightly progressed and she expressed she did not want any further CNS directed treatments. She is admitted on 09/19/2023 with shortness of breath. Workup reveals sepsis due to aspiration pneumonia. She is currently in ICU on bipap. MRI of brain showed new progressing brain mass. Palliative consulted for GOC and medical decision making. She has vocal chord paralysis and likely needs tracheostomy and PEG tube if she desires continued aggressive medical. FINDINGS: MEDIASTINUM: Heart and pericardium are unremarkable. The central airways are clear. Right chest port terminating in the upper right atrium. LYMPH NODES: No mediastinal, hilar or axillary lymphadenopathy. LUNGS AND PLEURA: Complete right middle and lower lobe collapse. Moderate right pleural effusion, progressive. Small left pleural effusion, progressive. Associated left lower lobe opacity,  atelectasis versus pneumonia. Mild centrilobular and paraseptal emphysematous changes, upper lung predominant. SOFT TISSUES/BONES: Radiation changes in the right hemithorax. UPPER ABDOMEN: Enteric tube coursing into the mid stomach. Moderate upper abdominal ascites, progressive. IMPRESSION: 1. Moderate right and small left pleural effusions and moderate upper abdominal ascites, progressive. 2. Otherwise unchanged from recent CT. 3. Complete right middle and lower lobe collapse. Patchy left lower lobe opacity, atelectasis versus pneumonia. 4. Additional ancillary findings as above. Electronically signed by: Pinkie Pebbles MD 09/22/2023 07:52 PM EDT RP Workstation: [id number]   DG CHEST PORT 1 VIEW Result Date: 09/22/2023 CLINICAL DATA:  Pneumonia. EXAM: PORTABLE CHEST 1 VIEW COMPARISON:  09/21/2023 and CT chest 09/20/2023. FINDINGS: Trachea is midline. Heart size is grossly stable. Right IJ power port tip is in the region of the SVC RA junction or high right atrium. Collapse/consolidation in the right perihilar region and right lung base, at least some of which is post treatment related. Appearance is unchanged from yesterday's study. Left lower lobe collapse/consolidation. Bilateral pleural effusions, stable. IMPRESSION: 1. Persistent consolidation the right lower lobe is compatible with pneumonia, as on CT chest 09/20/2023. 2. Post treatment consolidation in the right perihilar region. 3. Similar left lower lobe collapse/consolidation and small left pleural effusion. Aspiration not excluded. Electronically Signed   By: Newell Eke M.D.  On: 09/22/2023 12:46   DG Chest Port 1 View Result Date: 09/21/2023 CLINICAL DATA:  63 year old female with lung cancer originally diagnosed in 2021. EXAM: PORTABLE CHEST 1 VIEW COMPARISON:  CTA chest yesterday, portable chests 09/19/2023 and earlier. FINDINGS: Portable AP semi upright view at 0641 hours. Stable lung volumes and mediastinal contours since 09/19/2023.  Dense right mid and lower lung opacification appears mildly progressed since that time, right lung apex remains aerated. Stable right chest Port-A-Cath. Confluent posterior basal segment left lower lobe collapse or consolidation redemonstrated from CTA yesterday and appears new or increased from 09/19/2023 also. Stable ventilation from the CTA scout view yesterday. No pulmonary edema. Stable paucity of bowel gas in the upper abdomen. Stable visualized osseous structures. IMPRESSION: Progressive right lung, left lung base opacification since 09/19/2023 portable x-ray, but not significantly changed from the CTA yesterday (please see that report). No new cardiopulmonary abnormality. Electronically Signed   By: VEAR Hurst M.D.   On: 09/21/2023 07:17   MR BRAIN W WO CONTRAST Result Date: 09/20/2023 CLINICAL DATA:  history brain mets and radiation, new right sided facial weakness. EXAM: MRI HEAD WITHOUT AND WITH CONTRAST TECHNIQUE: Multiplanar, multiecho pulse sequences of the brain and surrounding structures were obtained without and with intravenous contrast. Postcontrast coronal and sagittal images were not obtained due to patient's inability to complete the study. CONTRAST:  4mL GADAVIST  GADOBUTROL  1 MMOL/ML IV SOLN COMPARISON:  MRI brain 01/05/2023. FINDINGS: Brain: Heterogeneously enhancing mass along the right cerebellopontine angle cistern, measuring up to 36 x 18 mm on axial image 7 series 11, with features suspicious for metastasis. Associated extension into the right internal auditory canal and right hypoglossal canal (axial image 4 series 11). Moderate mass effect on the brainstem and right cerebellar hemisphere. Previously treated supratentorial metastases are markedly decreased in size and number. Minimal residual enhancement associated with the lesion along the left superior frontal gyrus (axial image 33 series 11). Minimal residual enhancement of the lesion along the right superior frontal gyrus (axial  image 34 series 11). 3 mm of residual enhancement associated with the previously treated lesion in the right parietal lobe. No acute infarct or hemorrhage. No hydrocephalus, extra-axial collection or midline shift. Vascular: The distal right sigmoid sinus is slightly attenuated by the right cerebellopontine angle cistern mass, but appears to remain patent. Skull and upper cervical spine: Normal marrow signal and enhancement. Sinuses/Orbits: No acute findings. Other: None. IMPRESSION: 1. Heterogeneously enhancing mass along the right cerebellopontine angle cistern, measuring up to 36 x 18 mm, with features suspicious for metastasis. Associated extension into the right internal auditory canal and right hypoglossal canal. Moderate mass effect on the brainstem and right cerebellar hemisphere. No hydrocephalus. 2. Previously treated supratentorial metastases are markedly decreased in size and number. Minimal residual enhancement associated with the lesions along the left superior frontal gyrus, right superior frontal gyrus, and right parietal lobe. Electronically Signed   By: Ryan Chess M.D.   On: 09/20/2023 19:15   DG Skull 1-3 Views Result Date: 09/20/2023 CLINICAL DATA:  Concern for foreign body EXAM: SKULL - 1-3 VIEW COMPARISON:  None Available. FINDINGS: There is no evidence of skull fracture or other focal bone lesions. IMPRESSION: No foreign body identified. Electronically Signed   By: Jackquline Boxer M.D.   On: 09/20/2023 17:58   ECHOCARDIOGRAM COMPLETE Result Date: 09/20/2023    ECHOCARDIOGRAM REPORT   Patient Name:   Elizabeth Mayer Date of Exam: 09/20/2023 Medical Rec #:  [id number]  Height:       65.0 in Accession #:    [id number]      Weight:       95.0 lb Date of Birth:  1961-02-14        BSA:          1.442 m Patient Age:    62 years        BP:           117/74 mmHg Patient Gender: F               HR:           90 bpm. Exam Location:  Inpatient Procedure: 2D Echo, Color Doppler and Cardiac  Doppler (Both Spectral and Color            Flow Doppler were utilized during procedure). Indications:    I48.91* Unspeicified atrial fibrillation  History:        Patient has no prior history of Echocardiogram examinations.  Sonographer:    Eva Lash Referring Phys: [id number] JUSTIN B HOWERTER IMPRESSIONS  1. Very mild intracavitary gradient. Peak velocity 1.03 m/s. Peak gradient 4.2 mmHg. Left ventricular ejection fraction, by estimation, is 65 to 70%. The left ventricle has normal function. The left ventricle has no regional wall motion abnormalities. There is mild concentric left ventricular hypertrophy. Left ventricular diastolic parameters are consistent with Grade I diastolic dysfunction (impaired relaxation).  2. Right ventricular systolic function is normal. The right ventricular size is normal.  3. A small pericardial effusion is present. There is no evidence of cardiac tamponade.  4. The mitral valve is normal in structure. Trivial mitral valve regurgitation. No evidence of mitral stenosis.  5. The aortic valve is normal in structure. There is mild calcification of the aortic valve. There is mild thickening of the aortic valve. Aortic valve regurgitation is mild. No aortic stenosis is present.  6. Unable to fully assess right atrial pressure. Patient unable to sniff. FINDINGS  Left Ventricle: Very mild intracavitary gradient. Peak velocity 1.03 m/s. Peak gradient 4.2 mmHg. Left ventricular ejection fraction, by estimation, is 65 to 70%. The left ventricle has normal function. The left ventricle has no regional wall motion abnormalities. The left ventricular internal cavity size was normal in size. There is mild concentric left ventricular hypertrophy. Left ventricular diastolic parameters are consistent with Grade I diastolic dysfunction (impaired relaxation). Right Ventricle: The right ventricular size is normal. No increase in right ventricular wall thickness. Right ventricular systolic function is  normal. Left Atrium: Left atrial size was normal in size. Right Atrium: Right atrial size was normal in size. Pericardium: A small pericardial effusion is present. There is excessive respiratory variation in the tricuspid valve spectral Doppler velocities. There is no evidence of cardiac tamponade. Mitral Valve: The mitral valve is normal in structure. Trivial mitral valve regurgitation. No evidence of mitral valve stenosis. Tricuspid Valve: The tricuspid valve is normal in structure. Tricuspid valve regurgitation is trivial. No evidence of tricuspid stenosis. Aortic Valve: The aortic valve is normal in structure. There is mild calcification of the aortic valve. There is mild thickening of the aortic valve. Aortic valve regurgitation is mild. No aortic stenosis is present. Aortic valve mean gradient measures 3.0 mmHg. Aortic valve peak gradient measures 7.3 mmHg. Aortic valve area, by VTI measures 2.24 cm. Pulmonic Valve: The pulmonic valve was normal in structure. Pulmonic valve regurgitation is not visualized. No evidence of pulmonic stenosis. Aorta: The aortic root is normal in size and structure. Venous:  Unable to fully assess right atrial pressure. Patient unable to sniff. IAS/Shunts: No atrial level shunt detected by color flow Doppler.  LEFT VENTRICLE PLAX 2D LVIDd:         3.00 cm     Diastology LVIDs:         2.20 cm     LV e' medial:    7.93 cm/s LV PW:         1.20 cm     LV E/e' medial:  9.3 LV IVS:        1.20 cm     LV e' lateral:   13.80 cm/s LVOT diam:     1.90 cm     LV E/e' lateral: 5.3 LV SV:         57 LV SV Index:   39 LVOT Area:     2.84 cm  LV Volumes (MOD) LV vol d, MOD A2C: 61.2 ml LV vol d, MOD A4C: 60.3 ml LV vol s, MOD A2C: 16.7 ml LV vol s, MOD A4C: 17.1 ml LV SV MOD A2C:     44.5 ml LV SV MOD A4C:     60.3 ml LV SV MOD BP:      44.4 ml RIGHT VENTRICLE RV S prime:     22.70 cm/s TAPSE (M-mode): 1.6 cm LEFT ATRIUM           Index LA diam:      2.70 cm 1.87 cm/m LA Vol (A2C): 26.1 ml  18.10 ml/m LA Vol (A4C): 28.6 ml 19.84 ml/m  AORTIC VALVE AV Area (Vmax):    2.54 cm AV Area (Vmean):   2.66 cm AV Area (VTI):     2.24 cm AV Vmax:           135.00 cm/s AV Vmean:          74.400 cm/s AV VTI:            0.253 m AV Peak Grad:      7.3 mmHg AV Mean Grad:      3.0 mmHg LVOT Vmax:         121.00 cm/s LVOT Vmean:        69.700 cm/s LVOT VTI:          0.200 m LVOT/AV VTI ratio: 0.79  AORTA Ao Asc diam: 2.70 cm MITRAL VALVE MV Area (PHT): 4.17 cm    SHUNTS MV Decel Time: 182 msec    Systemic VTI:  0.20 m MV E velocity: 73.50 cm/s  Systemic Diam: 1.90 cm MV A velocity: 69.80 cm/s MV E/A ratio:  1.05 Annabella Scarce MD Electronically signed by Annabella Scarce MD Signature Date/Time: 09/20/2023/11:09:34 AM    Final    CT Angio Chest Pulmonary Embolism (PE) W or WO Contrast Result Date: 09/20/2023 CLINICAL DATA:  63 year old female with shortness of breath. Lung cancer originally diagnosed in 2021. * Tracking Code: BO * EXAM: CT ANGIOGRAPHY CHEST WITH CONTRAST TECHNIQUE: Multidetector CT imaging of the chest was performed using the standard protocol during bolus administration of intravenous contrast. Multiplanar CT image reconstructions and MIPs were obtained to evaluate the vascular anatomy. RADIATION DOSE REDUCTION: This exam was performed according to the departmental dose-optimization program which includes automated exposure control, adjustment of the mA and/or kV according to patient size and/or use of iterative reconstruction technique. CONTRAST:  75mL OMNIPAQUE  IOHEXOL  350 MG/ML SOLN COMPARISON:  Restaging chest CT 05/19/2022 and earlier. FINDINGS: Cardiovascular: Good contrast bolus timing in the pulmonary arterial tree. Extensive enhancing  chest wall and paravertebral venous collaterals. At apparent chronic partial right lower lobe pneumonectomy. Right lower lobe pulmonary artery branches appear to be chronically absent. Tapered appearance of right upper lobe branches. But no pulmonary  artery filling defect identified bilaterally. Right chest Port-A-Cath is chronic. Stable thoracic aorta. Heart size remains normal. No pericardial effusion. Mediastinum/Nodes: No convincing mediastinal mass or lymphadenopathy. Lungs/Pleura: Abnormal right lung. Chronic perihilar post radiation changes demonstrated last year with perihilar bronchiectasis. Progressive surrounding lung consolidation since that time, and new opacification of the right bronchus intermedius, abruptly and just distal to the right mainstem bronchus on series 6, image 59. Confluent opacity throughout the residual right lower and middle lobes, with air bronchograms. Bronchiectasis. Difficult to exclude early cavitation. And no enhancement of the lung parenchyma there. Superimposed adjacent right upper lobe peribronchial inflammatory appearing opacity, series 6, image 67. Contralateral left lung with centrilobular emphysema. Left upper lobe and lingula are stable from last year but there is new left lower lobe posterior basal segment confluent peribronchial opacity with air bronchograms, but also partially enhancing lung parenchyma there. Left lung airways remain patent. Superimposed small partially sub pulmonic right pleural effusion, simple fluid density. Upper Abdomen: Grossly stable and negative visible early contrast appearance of the upper abdominal viscera. Musculoskeletal: Enhancing chest wall and paravertebral venous collaterals. No acute or suspicious osseous lesion is identified. Review of the MIP images confirms the above findings. IMPRESSION: 1. Negative for acute pulmonary embolus. 2. Positive for chronic post treatment changes to the right lung, including chronic radiation pneumonitis. But superimposed newly opacified bronchus intermedius and associated Severe New Right middle and lower lobe Consolidation. Large volume Aspiration or Necrotizing Pneumonia are not excluded. Small superimposed right pleural effusion. Early  infectious involvement of the adjacent right upper lobe. And contralateral segmental consolidation in the Left Lower Lobe. 3.  Emphysema (ICD10-J43.9). Electronically Signed   By: VEAR Hurst M.D.   On: 09/20/2023 06:36   DG Chest Portable 1 View Result Date: 09/19/2023 CLINICAL DATA:  Short of breath, history of metastatic non-small cell lung cancer EXAM: PORTABLE CHEST 1 VIEW COMPARISON:  03/25/2021, 05/19/2022 FINDINGS: Single frontal view of the chest demonstrates right chest wall port tip overlying atriocaval junction. External defibrillator pads overlie the chest. Cardiac silhouette is unremarkable. There is prominence of the right hilum again noted, with new right basilar consolidation and right pleural effusion. Chronic elevation of the right hemidiaphragm. No pneumothorax. Left chest is clear. No acute bony abnormalities. IMPRESSION: 1. Right basilar consolidation and right pleural effusion, new since prior studies. I would favor acute pneumonia or aspiration, though close follow-up after treatment is recommended to exclude recurrent malignancy. 2. Stable right hilar prominence, consistent with post therapeutic changes and bronchiectasis seen on prior CT. Electronically Signed   By: Ozell Daring M.D.   On: 09/19/2023 17:31       The results of significant diagnostics from this hospitalization (including imaging, microbiology, ancillary and laboratory) are listed below for reference.     Microbiology: No results found for this or any previous visit (from the past 240 hours).   Labs:  CBC: No results for input(s): WBC, NEUTROABS, HGB, HCT, MCV, PLT in the last 168 hours. BMP &GFR No results for input(s): NA, K, CL, CO2, GLUCOSE, BUN, CREATININE, CALCIUM , MG, PHOS in the last 168 hours.  Invalid input(s): GFRCG Estimated Creatinine Clearance: 60.6 mL/min (by C-G formula based on SCr of 0.67 mg/dL). Liver & Pancreas: No results for input(s): AST, ALT,  ALKPHOS, BILITOT, PROT,  ALBUMIN  in the last 168 hours. No results for input(s): LIPASE, AMYLASE in the last 168 hours. No results for input(s): AMMONIA in the last 168 hours. Diabetic: No results for input(s): HGBA1C in the last 72 hours. No results for input(s): GLUCAP in the last 168 hours. Cardiac Enzymes: No results for input(s): CKTOTAL, CKMB, CKMBINDEX, TROPONINI in the last 168 hours. No results for input(s): PROBNP in the last 8760 hours. Coagulation Profile: No results for input(s): INR, PROTIME in the last 168 hours. Thyroid  Function Tests: No results for input(s): TSH, T4TOTAL, FREET4, T3FREE, THYROIDAB in the last 72 hours. Lipid Profile: No results for input(s): CHOL, HDL, LDLCALC, TRIG, CHOLHDL, LDLDIRECT in the last 72 hours. Anemia Panel: No results for input(s): VITAMINB12, FOLATE, FERRITIN, TIBC, IRON, RETICCTPCT in the last 72 hours. Urine analysis:    Component Value Date/Time   COLORURINE YELLOW 09/20/2023 0358   APPEARANCEUR CLEAR 09/20/2023 0358   LABSPEC 1.018 09/20/2023 0358   PHURINE 6.0 09/20/2023 0358   GLUCOSEU NEGATIVE 09/20/2023 0358   HGBUR NEGATIVE 09/20/2023 0358   HGBUR negative 10/31/2007 1057   BILIRUBINUR NEGATIVE 09/20/2023 0358   BILIRUBINUR negative 02/15/2023 1427   BILIRUBINUR negative 02/10/2022 1623   KETONESUR NEGATIVE 09/20/2023 0358   PROTEINUR NEGATIVE 09/20/2023 0358   UROBILINOGEN 0.2 02/15/2023 1427   UROBILINOGEN 0.2 10/31/2007 1057   NITRITE NEGATIVE 09/20/2023 0358   LEUKOCYTESUR NEGATIVE 09/20/2023 0358   Sepsis Labs: Invalid input(s): PROCALCITONIN, LACTICIDVEN   SIGNED:  Kimberly Coye T Rooney Gladwin, MD  [facility] 10/06/2023, 9:59 AM

## 2023-11-05 NOTE — Plan of Care (Signed)

## 2023-11-05 NOTE — Progress Notes (Signed)
 IVT to bedside for AM line care. Currently having breakfast but agreeable to needle change day.  Would like IVT to return later

## 2023-11-05 NOTE — TOC Transition Note (Signed)
 Transition of Care [facility]) - Discharge Note   Patient Details  Name: Elizabeth Mayer MRN: [id number] Date of Birth: 1960-05-24  [facility]) CM/SW Contact:  Luann SHAUNNA Cumming, LCSW Phone Number: 10/06/2023, 1:06 PM   Clinical Narrative:      Per MD patient ready for DC to [facility]. RN, patient, patient's family, and facility notified of DC. Discharge Summary and FL2 sent to facility. RN to call report prior to discharge [phone]). DC packet on chart. Ambulance transport requested for patient.   CSW will sign off for now as social work intervention is no longer needed. Please consult us  again if new needs arise.   Final next level of care: Skilled Nursing Facility Barriers to Discharge: No Barriers Identified   Patient Goals and CMS Choice Patient states their goals for this hospitalization and ongoing recovery are:: Family would like to look at [facility]   Choice offered to / list presented to : Sibling      Discharge Placement              Patient chooses bed at:  [facility]) Patient to be transferred to facility by: [facility] Name of family member notified: Marylen Sella (Brother)  [phone] Patient and family notified of of transfer: 10/06/23  Discharge Plan and Services Additional resources added to the After Visit Summary for   In-house Referral: Clinical Social Work Discharge Planning Services: CM Consult Post Acute Care Choice: Hospice                      [facility] Agency: [facility] Date [facility] Agency Contacted: 09/28/23 Time HH Agency Contacted: [id number] Representative spoke with at [facility] Agency: Melissa  Social Drivers of Health (SDOH) Interventions SDOH Screenings   Food Insecurity: No Food Insecurity (09/22/2023)  Housing: Low Risk  (09/22/2023)  Transportation Needs: No Transportation Needs (09/22/2023)  Utilities: Not At Risk (09/22/2023)  Alcohol Screen: Low Risk  (02/15/2023)   Depression (PHQ2-9): Low Risk  (02/15/2023)  Financial Resource Strain: Low Risk  (02/15/2023)  Physical Activity: Sufficiently Active (02/15/2023)  Social Connections: Moderately Isolated (02/15/2023)  Stress: No Stress Concern Present (02/15/2023)  Tobacco Use: Medium Risk (09/19/2023)  Health Literacy: Adequate Health Literacy (02/15/2023)     Readmission Risk Interventions     No data to display

## 2023-11-05 NOTE — Progress Notes (Signed)
 RN gave report to [facility] nurse from [facility] [employer].

## 2023-11-05 DEATH — deceased

## 2023-12-20 NOTE — Progress Notes (Signed)
 Orders placed during this encounter.

## 2024-03-01 ENCOUNTER — Ambulatory Visit

## 2024-03-01 ENCOUNTER — Ambulatory Visit: Payer: Medicare Other
